# Patient Record
Sex: Male | Born: 1950
Health system: Southern US, Community
[De-identification: ages and names within clinical notes are randomized; demographics above are authoritative.]

## PROBLEM LIST (undated history)

## (undated) DIAGNOSIS — N400 Enlarged prostate without lower urinary tract symptoms: Secondary | ICD-10-CM

## (undated) DIAGNOSIS — H269 Unspecified cataract: Secondary | ICD-10-CM

## (undated) DIAGNOSIS — E039 Hypothyroidism, unspecified: Secondary | ICD-10-CM

## (undated) DIAGNOSIS — I10 Essential (primary) hypertension: Secondary | ICD-10-CM

## (undated) DIAGNOSIS — T148XXA Other injury of unspecified body region, initial encounter: Secondary | ICD-10-CM

## (undated) DIAGNOSIS — J449 Chronic obstructive pulmonary disease, unspecified: Secondary | ICD-10-CM

## (undated) DIAGNOSIS — R51 Headache: Secondary | ICD-10-CM

## (undated) DIAGNOSIS — T7840XA Allergy, unspecified, initial encounter: Secondary | ICD-10-CM

## (undated) DIAGNOSIS — G971 Other reaction to spinal and lumbar puncture: Secondary | ICD-10-CM

## (undated) DIAGNOSIS — I2510 Atherosclerotic heart disease of native coronary artery without angina pectoris: Secondary | ICD-10-CM

## (undated) DIAGNOSIS — E119 Type 2 diabetes mellitus without complications: Secondary | ICD-10-CM

## (undated) DIAGNOSIS — M479 Spondylosis, unspecified: Secondary | ICD-10-CM

## (undated) DIAGNOSIS — M519 Unspecified thoracic, thoracolumbar and lumbosacral intervertebral disc disorder: Secondary | ICD-10-CM

## (undated) DIAGNOSIS — E785 Hyperlipidemia, unspecified: Principal | ICD-10-CM

## (undated) DIAGNOSIS — N281 Cyst of kidney, acquired: Secondary | ICD-10-CM

## (undated) DIAGNOSIS — K219 Gastro-esophageal reflux disease without esophagitis: Secondary | ICD-10-CM

## (undated) HISTORY — PX: SINOSCOPY: SHX187

## (undated) HISTORY — DX: Type 2 diabetes mellitus without complications: E11.9

## (undated) HISTORY — PX: FOOT FRACTURE SURGERY: SHX645

## (undated) HISTORY — DX: Spondylosis, unspecified: M47.9

## (undated) HISTORY — PX: COLONOSCOPY W/ POLYPECTOMY: SHX1380

## (undated) HISTORY — DX: Benign prostatic hyperplasia without lower urinary tract symptoms: N40.0

## (undated) HISTORY — DX: Unspecified thoracic, thoracolumbar and lumbosacral intervertebral disc disorder: M51.9

## (undated) HISTORY — DX: Hyperlipidemia, unspecified: E78.5

## (undated) HISTORY — DX: Chronic obstructive pulmonary disease, unspecified: J44.9

## (undated) HISTORY — DX: Unspecified cataract: H26.9

## (undated) HISTORY — PX: EYE SURGERY: SHX253

## (undated) HISTORY — DX: Essential (primary) hypertension: I10

## (undated) HISTORY — PX: CHOLECYSTECTOMY: SHX55

## (undated) HISTORY — DX: Cyst of kidney, acquired: N28.1

## (undated) HISTORY — DX: Gastro-esophageal reflux disease without esophagitis: K21.9

## (undated) HISTORY — DX: Allergy, unspecified, initial encounter: T78.40XA

## (undated) HISTORY — PX: NECK SURGERY: SHX720

## (undated) HISTORY — PX: UPPER GASTROINTESTINAL ENDOSCOPY: SHX188

## (undated) HISTORY — PX: FRACTURE SURGERY: SHX138

## (undated) HISTORY — PX: BACK SURGERY: SHX140

## (undated) HISTORY — DX: Atherosclerotic heart disease of native coronary artery without angina pectoris: I25.10

## (undated) HISTORY — PX: COLONOSCOPY: SHX174

---

## 2000-09-04 ENCOUNTER — Encounter: Payer: Self-pay | Admitting: Gastroenterology

## 2004-01-15 ENCOUNTER — Encounter: Admission: RE | Admit: 2004-01-15 | Discharge: 2004-01-15 | Payer: Self-pay | Admitting: Endocrinology

## 2004-04-12 ENCOUNTER — Encounter: Payer: Self-pay | Admitting: Gastroenterology

## 2004-04-12 LAB — HM COLONOSCOPY

## 2004-05-09 ENCOUNTER — Ambulatory Visit: Payer: Self-pay | Admitting: Endocrinology

## 2004-06-26 HISTORY — PX: SPINE SURGERY: SHX786

## 2004-07-28 ENCOUNTER — Ambulatory Visit: Payer: Self-pay | Admitting: Internal Medicine

## 2004-08-24 ENCOUNTER — Ambulatory Visit (HOSPITAL_COMMUNITY): Admission: RE | Admit: 2004-08-24 | Discharge: 2004-08-25 | Payer: Self-pay | Admitting: Specialist

## 2004-11-11 ENCOUNTER — Encounter: Admission: RE | Admit: 2004-11-11 | Discharge: 2004-11-11 | Payer: Self-pay | Admitting: Specialist

## 2004-11-23 ENCOUNTER — Ambulatory Visit: Payer: Self-pay | Admitting: Gastroenterology

## 2005-01-17 ENCOUNTER — Ambulatory Visit: Payer: Self-pay | Admitting: Endocrinology

## 2005-01-18 ENCOUNTER — Ambulatory Visit: Payer: Self-pay | Admitting: Endocrinology

## 2005-02-08 ENCOUNTER — Ambulatory Visit: Payer: Self-pay | Admitting: Cardiology

## 2005-06-13 ENCOUNTER — Inpatient Hospital Stay (HOSPITAL_COMMUNITY): Admission: RE | Admit: 2005-06-13 | Discharge: 2005-06-14 | Payer: Self-pay | Admitting: Specialist

## 2005-06-26 HISTORY — PX: CARDIAC CATHETERIZATION: SHX172

## 2005-11-23 ENCOUNTER — Ambulatory Visit: Payer: Self-pay | Admitting: Cardiology

## 2005-11-23 ENCOUNTER — Inpatient Hospital Stay (HOSPITAL_COMMUNITY): Admission: EM | Admit: 2005-11-23 | Discharge: 2005-11-25 | Payer: Self-pay | Admitting: Emergency Medicine

## 2005-12-11 ENCOUNTER — Ambulatory Visit (HOSPITAL_COMMUNITY): Admission: RE | Admit: 2005-12-11 | Discharge: 2005-12-11 | Payer: Self-pay | Admitting: *Deleted

## 2005-12-11 ENCOUNTER — Ambulatory Visit: Payer: Self-pay | Admitting: Cardiology

## 2006-01-29 ENCOUNTER — Ambulatory Visit: Payer: Self-pay | Admitting: *Deleted

## 2006-03-05 ENCOUNTER — Ambulatory Visit: Payer: Self-pay | Admitting: *Deleted

## 2006-04-13 ENCOUNTER — Ambulatory Visit: Payer: Self-pay | Admitting: Endocrinology

## 2006-11-26 ENCOUNTER — Ambulatory Visit: Payer: Self-pay | Admitting: Gastroenterology

## 2006-12-05 ENCOUNTER — Ambulatory Visit: Payer: Self-pay | Admitting: *Deleted

## 2006-12-10 ENCOUNTER — Ambulatory Visit: Payer: Self-pay | Admitting: Cardiovascular Disease

## 2006-12-10 LAB — CONVERTED CEMR LAB
ALT: 22 units/L (ref 0–40)
AST: 16 units/L (ref 0–37)
Albumin: 3.8 g/dL (ref 3.5–5.2)
Alkaline Phosphatase: 51 units/L (ref 39–117)
Bilirubin, Direct: 0.1 mg/dL (ref 0.0–0.3)
Cholesterol: 178 mg/dL (ref 0–200)
HDL: 40.5 mg/dL (ref >39.0)
LDL Cholesterol: 119 mg/dL — ABNORMAL HIGH (ref 0–99)
Total Bilirubin: 1.3 mg/dL — ABNORMAL HIGH (ref 0.3–1.2)
Total CHOL/HDL Ratio: 4.4
Total Protein: 6.4 g/dL (ref 6.0–8.3)
Triglycerides: 92 mg/dL (ref 0–149)
VLDL: 18 mg/dL (ref 0–40)

## 2007-04-09 ENCOUNTER — Encounter: Payer: Self-pay | Admitting: *Deleted

## 2007-04-09 DIAGNOSIS — K219 Gastro-esophageal reflux disease without esophagitis: Secondary | ICD-10-CM | POA: Insufficient documentation

## 2007-04-09 DIAGNOSIS — E1169 Type 2 diabetes mellitus with other specified complication: Secondary | ICD-10-CM | POA: Insufficient documentation

## 2007-04-09 DIAGNOSIS — E785 Hyperlipidemia, unspecified: Secondary | ICD-10-CM

## 2007-04-09 DIAGNOSIS — M479 Spondylosis, unspecified: Secondary | ICD-10-CM | POA: Insufficient documentation

## 2007-04-09 HISTORY — DX: Hyperlipidemia, unspecified: E78.5

## 2007-04-09 HISTORY — DX: Spondylosis, unspecified: M47.9

## 2007-04-09 HISTORY — DX: Gastro-esophageal reflux disease without esophagitis: K21.9

## 2007-05-31 ENCOUNTER — Ambulatory Visit: Payer: Self-pay | Admitting: Cardiovascular Disease

## 2007-05-31 LAB — CONVERTED CEMR LAB
ALT: 33 units/L (ref 0–53)
AST: 23 units/L (ref 0–37)
Albumin: 3.7 g/dL (ref 3.5–5.2)
Alkaline Phosphatase: 60 units/L (ref 39–117)
BUN: 19 mg/dL (ref 6–23)
Bilirubin, Direct: 0.2 mg/dL (ref 0.0–0.3)
CO2: 24 meq/L (ref 19–32)
Calcium: 8.7 mg/dL (ref 8.4–10.5)
Chloride: 107 meq/L (ref 96–112)
Creatinine, Ser: 0.9 mg/dL (ref 0.4–1.5)
GFR calc Af Amer: 113 mL/min
GFR calc non Af Amer: 93 mL/min
Glucose, Bld: 163 mg/dL — ABNORMAL HIGH (ref 70–99)
Potassium: 3.3 meq/L — ABNORMAL LOW (ref 3.5–5.1)
Sodium: 141 meq/L (ref 135–145)
Total Bilirubin: 1.1 mg/dL (ref 0.3–1.2)
Total Protein: 6.2 g/dL (ref 6.0–8.3)

## 2007-06-07 ENCOUNTER — Ambulatory Visit: Payer: Self-pay | Admitting: Cardiovascular Disease

## 2007-06-07 LAB — CONVERTED CEMR LAB
BUN: 15 mg/dL (ref 6–23)
CO2: 27 meq/L (ref 19–32)
Calcium: 8.7 mg/dL (ref 8.4–10.5)
Chloride: 107 meq/L (ref 96–112)
Creatinine, Ser: 0.9 mg/dL (ref 0.4–1.5)
GFR calc Af Amer: 113 mL/min
GFR calc non Af Amer: 93 mL/min
Glucose, Bld: 105 mg/dL — ABNORMAL HIGH (ref 70–99)
Potassium: 3.7 meq/L (ref 3.5–5.1)
Sodium: 142 meq/L (ref 135–145)

## 2007-11-14 ENCOUNTER — Telehealth (INDEPENDENT_AMBULATORY_CARE_PROVIDER_SITE_OTHER): Payer: Self-pay | Admitting: Gastroenterology

## 2007-11-15 ENCOUNTER — Ambulatory Visit: Payer: Self-pay | Admitting: Gastroenterology

## 2008-01-10 ENCOUNTER — Ambulatory Visit: Payer: Self-pay | Admitting: Cardiovascular Disease

## 2008-01-31 ENCOUNTER — Ambulatory Visit: Payer: Self-pay

## 2008-01-31 ENCOUNTER — Ambulatory Visit: Payer: Self-pay | Admitting: Cardiovascular Disease

## 2008-01-31 LAB — CONVERTED CEMR LAB
ALT: 28 units/L (ref 0–53)
AST: 22 units/L (ref 0–37)
Albumin: 3.9 g/dL (ref 3.5–5.2)
Alkaline Phosphatase: 63 units/L (ref 39–117)
Bilirubin, Direct: 0.1 mg/dL (ref 0.0–0.3)
Cholesterol: 152 mg/dL (ref 0–200)
HDL: 35.4 mg/dL — ABNORMAL LOW (ref >39.0)
LDL Cholesterol: 104 mg/dL — ABNORMAL HIGH (ref 0–99)
Total Bilirubin: 1.2 mg/dL (ref 0.3–1.2)
Total CHOL/HDL Ratio: 4.3
Total Protein: 6.5 g/dL (ref 6.0–8.3)
Triglycerides: 64 mg/dL (ref 0–149)
VLDL: 13 mg/dL (ref 0–40)

## 2008-07-22 ENCOUNTER — Ambulatory Visit: Payer: Self-pay | Admitting: Gastroenterology

## 2008-08-18 ENCOUNTER — Encounter: Payer: Self-pay | Admitting: Gastroenterology

## 2008-08-18 ENCOUNTER — Ambulatory Visit: Payer: Self-pay | Admitting: Gastroenterology

## 2008-08-21 ENCOUNTER — Encounter: Payer: Self-pay | Admitting: Gastroenterology

## 2009-06-29 ENCOUNTER — Telehealth: Payer: Self-pay | Admitting: Gastroenterology

## 2010-03-14 ENCOUNTER — Encounter: Payer: Self-pay | Admitting: Endocrinology

## 2010-03-23 ENCOUNTER — Encounter: Payer: Self-pay | Admitting: Endocrinology

## 2010-03-28 ENCOUNTER — Encounter: Admission: RE | Admit: 2010-03-28 | Discharge: 2010-03-28 | Payer: Self-pay | Admitting: Specialist

## 2010-04-08 ENCOUNTER — Telehealth: Payer: Self-pay | Admitting: Endocrinology

## 2010-04-19 ENCOUNTER — Ambulatory Visit: Payer: Self-pay | Admitting: Cardiovascular Disease

## 2010-04-19 DIAGNOSIS — I2510 Atherosclerotic heart disease of native coronary artery without angina pectoris: Secondary | ICD-10-CM

## 2010-04-19 DIAGNOSIS — I10 Essential (primary) hypertension: Secondary | ICD-10-CM

## 2010-04-19 HISTORY — DX: Essential (primary) hypertension: I10

## 2010-04-19 HISTORY — DX: Atherosclerotic heart disease of native coronary artery without angina pectoris: I25.10

## 2010-04-21 ENCOUNTER — Encounter: Payer: Self-pay | Admitting: Cardiovascular Disease

## 2010-04-25 ENCOUNTER — Encounter: Payer: Self-pay | Admitting: Endocrinology

## 2010-04-28 ENCOUNTER — Encounter (INDEPENDENT_AMBULATORY_CARE_PROVIDER_SITE_OTHER): Payer: Self-pay | Admitting: *Deleted

## 2010-05-04 ENCOUNTER — Telehealth (INDEPENDENT_AMBULATORY_CARE_PROVIDER_SITE_OTHER): Payer: Self-pay | Admitting: *Deleted

## 2010-05-17 ENCOUNTER — Ambulatory Visit: Payer: Self-pay | Admitting: Endocrinology

## 2010-05-27 ENCOUNTER — Ambulatory Visit: Payer: Self-pay | Admitting: Endocrinology

## 2010-05-27 DIAGNOSIS — M519 Unspecified thoracic, thoracolumbar and lumbosacral intervertebral disc disorder: Secondary | ICD-10-CM | POA: Insufficient documentation

## 2010-05-27 DIAGNOSIS — N281 Cyst of kidney, acquired: Secondary | ICD-10-CM | POA: Insufficient documentation

## 2010-05-27 HISTORY — DX: Unspecified thoracic, thoracolumbar and lumbosacral intervertebral disc disorder: M51.9

## 2010-05-27 HISTORY — DX: Cyst of kidney, acquired: N28.1

## 2010-05-27 LAB — CONVERTED CEMR LAB
ALT: 41 units/L (ref 0–53)
AST: 25 units/L (ref 0–37)
Albumin: 4.1 g/dL (ref 3.5–5.2)
Alkaline Phosphatase: 76 units/L (ref 39–117)
BUN: 20 mg/dL (ref 6–23)
Basophils Absolute: 0 10*3/uL (ref 0.0–0.1)
Basophils Relative: 0.4 % (ref 0.0–3.0)
Bilirubin Urine: NEGATIVE
Bilirubin, Direct: 0.2 mg/dL (ref 0.0–0.3)
CO2: 27 meq/L (ref 19–32)
Calcium: 9.3 mg/dL (ref 8.4–10.5)
Chloride: 104 meq/L (ref 96–112)
Cholesterol: 141 mg/dL (ref 0–200)
Creatinine, Ser: 1 mg/dL (ref 0.4–1.5)
Eosinophils Absolute: 0.2 10*3/uL (ref 0.0–0.7)
Eosinophils Relative: 2.6 % (ref 0.0–5.0)
GFR calc non Af Amer: 78.79 mL/min (ref >60)
Glucose, Bld: 119 mg/dL — ABNORMAL HIGH (ref 70–99)
HCT: 48.3 % (ref 39.0–52.0)
HDL: 40.8 mg/dL (ref >39.00)
Hemoglobin: 16.9 g/dL (ref 13.0–17.0)
Hgb A1c MFr Bld: 6.8 % — ABNORMAL HIGH (ref 4.6–6.5)
Ketones, ur: NEGATIVE mg/dL
LDL Cholesterol: 76 mg/dL (ref 0–99)
Leukocytes, UA: NEGATIVE
Lymphocytes Relative: 20.6 % (ref 12.0–46.0)
Lymphs Abs: 1.8 10*3/uL (ref 0.7–4.0)
MCHC: 35 g/dL (ref 30.0–36.0)
MCV: 97.5 fL (ref 78.0–100.0)
Monocytes Absolute: 0.6 10*3/uL (ref 0.1–1.0)
Monocytes Relative: 7 % (ref 3.0–12.0)
Neutro Abs: 6.2 10*3/uL (ref 1.4–7.7)
Neutrophils Relative %: 69.4 % (ref 43.0–77.0)
Nitrite: NEGATIVE
PSA: 1.1 ng/mL (ref 0.10–4.00)
Platelets: 170 10*3/uL (ref 150.0–400.0)
Potassium: 3.7 meq/L (ref 3.5–5.1)
RBC: 4.95 M/uL (ref 4.22–5.81)
RDW: 12.7 % (ref 11.5–14.6)
Sodium: 141 meq/L (ref 135–145)
Specific Gravity, Urine: >=1.03 (ref 1.000–1.030)
TSH: 1.26 microintl units/mL (ref 0.35–5.50)
Total Bilirubin: 1.6 mg/dL — ABNORMAL HIGH (ref 0.3–1.2)
Total CHOL/HDL Ratio: 3
Total Protein, Urine: 30 mg/dL
Total Protein: 6.8 g/dL (ref 6.0–8.3)
Triglycerides: 122 mg/dL (ref 0.0–149.0)
Urine Glucose: NEGATIVE mg/dL
Urobilinogen, UA: 0.2 (ref 0.0–1.0)
VLDL: 24.4 mg/dL (ref 0.0–40.0)
WBC: 8.9 10*3/uL (ref 4.5–10.5)
pH: 5.5 (ref 5.0–8.0)

## 2010-07-17 ENCOUNTER — Encounter: Payer: Self-pay | Admitting: Endocrinology

## 2010-07-27 NOTE — Assessment & Plan Note (Signed)
Summary: f1y  Medications Added HYZAAR 100-12.5 MG TABS (LOSARTAN POTASSIUM-HCTZ) take one tablet by mouth daily      Allergies Added: NKDA  Visit Type:  1 year follow up Primary Aijalon Demuro:  Romero Belling, MD  CC:   No complaints.  History of Present Illness: 60 year-old male with history of moderate nonobstructive CAD presents for followup evaluation today. He has not been seen in over 2 years. He would like to switch antihypertensives because of cost...amlodipine/benazepril is now $35/month.  He denies chest pain with exertion, but does complain of burning in his chest at times. Complains of exertional dyspnea with climbing stairs. He does a lot of walking. No other complaints today.  He has not been taking his statin drug regularly.  Current Medications (verified): 1)  Amlodipine Besy-Benazepril Hcl 5-20 Mg  Caps (Amlodipine Besy-Benazepril Hcl) .... Once Daily 2)  Toprol Xl 25 Mg  Tb24 (Metoprolol Succinate) .... Once Daily 3)  Pantoprazole Sodium 40 Mg  Tbec (Pantoprazole Sodium) .... Once Daily 4)  Simvastatin 20 Mg Tabs (Simvastatin) .... At Bedtime  Allergies (verified): No Known Drug Allergies  Past History:  Past medical history reviewed for relevance to current acute and chronic problems.  Past Medical History: GERD Hyperlipidemia CAD, nonobstructive by cath 2007  Social History: Positive tobacco. Works in Set designer.  Review of Systems       Negative except as per HPI.  Vital Signs:  Patient profile:   60 year old male Height:      71 inches Weight:      180.50 pounds BMI:     25.27 Pulse rate:   73 / minute Pulse rhythm:   regular Resp:     18 per minute BP sitting:   120 / 90  (left arm) Cuff size:   large  Vitals Entered By: Vikki Ports (April 19, 2010 11:32 AM)  Physical Exam  General:  Pt is alert and oriented, pleasant, in no acute distress. HEENT: normal Neck: normal carotid upstrokes without bruits, JVP normal Lungs: CTA CV:  RRR without murmur or gallop Abd: soft, NT, positive BS, no bruit, no organomegaly Ext: no clubbing, cyanosis, or edema. peripheral pulses 2+ and equal Skin: warm and dry without rash    EKG  Procedure date:  04/19/2010  Findings:      Sinus rhythm with PAC's otherwise within normal limits. HR 73 bpm.  Impression & Recommendations:  Problem # 1:  CAD, NATIVE VESSEL (ICD-414.01)  Pt appears stable with normal EKG, but he is having chest pains. Recommend check an exercise Myoview in this patient with known 50% LAD stenosis. Continue ASA and risk reduction measures as below.   His updated medication list for this problem includes:    Toprol Xl 25 Mg Tb24 (Metoprolol succinate) ..... Once daily  Orders: EKG w/ Interpretation (93000) Nuclear Stress Test (Nuc Stress Test)  Problem # 2:  HYPERLIPIDEMIA (ICD-272.4) Resume daily use of a statin and followup lipids and LFT's.  His updated medication list for this problem includes:    Simvastatin 20 Mg Tabs (Simvastatin) .[name]... At bedtime  CHOL: 152 (01/31/2008)   LDL: 104 (01/31/2008)   HDL: 35.4 (01/31/2008)   TG: 64 (01/31/2008)  Problem # 3:  HYPERTENSION, BENIGN (ICD-401.1)  Change to Hyzaar for cost reduction. Metabolic panel in 2 weeks.  His updated medication list for this problem includes:    Hyzaar 100-12.5 Mg Tabs (Losartan potassium-hctz) .[name]... Take one tablet by mouth daily    Toprol Xl 25 Mg  Tb24 (Metoprolol succinate) ..... Once daily  Orders: EKG w/ Interpretation (93000) Nuclear Stress Test (Nuc Stress Test)  Patient Instructions: 1)  Your physician recommends that you return for lab work in: 2 WEEKS (BMP, LIVER)--414.01, 401.9, 272.0 2)  Your physician has recommended you make the following change in your medication: STOP Amlodipine/Benazepril, START Hyzaar 100/12.5mg  once a day 3)  Your physician wants you to follow-up in:  1  YEAR.  You will receive a reminder letter in the mail two months in advance. If you  don't receive a letter, please call our office to schedule the follow-up appointment. 4)  Your physician has requested that you have an exercise stress myoview.  For further information please visit [email or web address]  Please follow instruction sheet, as given. 5)  TAKE YOUR SIMVASTATIN EVERYDAY!!! Prescriptions: HYZAAR 100-12.5 MG TABS (LOSARTAN POTASSIUM-HCTZ) take one tablet by mouth daily  #30 x 11   Entered by:   Julieta Gutting, RN, BSN   Authorized by:   Norva Karvonen, MD   Signed by:   Julieta Gutting, RN, BSN on 04/19/2010   Method used:   Electronically to        [facility]. [phone]* (retail)       [phone] [address].       [place], [place]  [address]       Ph: [phone]       Fax: [phone]   RxID:   [id number]

## 2010-07-27 NOTE — Assessment & Plan Note (Signed)
Summary: PER PT PHYSICAL  -LAST OV--04/13/06  ---STC   Vital Signs:  Patient profile:   60 year old male Height:      71 inches (180.34 cm) Weight:      191.38 pounds (86.99 kg) BMI:     26.79 O2 Sat:      95 % on Room air Temp:     97.7 degrees F (36.50 degrees C) oral Pulse rate:   80 / minute BP sitting:   140 / 88  (left arm) Cuff size:   large  Vitals Entered By: Brenton Grills CMA Duncan Dull) (May 27, 2010 9:40 AM)  O2 Flow:  Room air CC: Physical/aj Is Patient Diabetic? No   Primary Jeff Lopez:  Romero Belling, MD  CC:  Physical/aj.  History of Present Illness: here for regular wellness examination.  He does not drink alcohol.      Current Medications (verified): 1)  Hyzaar 100-12.5 Mg Tabs (Losartan Potassium-Hctz) .... Take One Tablet By Mouth Daily 2)  Toprol Xl 25 Mg  Tb24 (Metoprolol Succinate) .... Once Daily 3)  Pantoprazole Sodium 40 Mg  Tbec (Pantoprazole Sodium) .... Once Daily 4)  Simvastatin 20 Mg Tabs (Simvastatin) .... At Bedtime  Allergies (verified): No Known Drug Allergies  Family History: Reviewed history from 04/15/2010 and no changes required.  His father is alive at age 22 with no history of heart  disease.  His mother died at age 89 of a massive heart attack.  He has one brother who is alive with a history of hypertension, but no heart disease. mother had breast and uterine cancer  Social History: Reviewed history from 04/19/2010 and no changes required. Positive tobacco. Works in Set designer.  Review of Systems  The patient denies fever, weight loss, weight gain, vision loss, decreased hearing, chest pain, syncope, dyspnea on exertion, prolonged cough, abdominal pain, melena, hematochezia, severe indigestion/heartburn, hematuria, and depression.         he has chronic headache.  denies decreased urinary stream  Physical Exam  General:  normal appearance.   Head:  head: no deformity eyes: no periorbital swelling, no  proptosis external nose and ears are normal mouth: no lesion seen Neck:  Supple without thyroid enlargement or tenderness.   Lungs:  Clear to auscultation bilaterally. Normal respiratory effort.  Heart:  Regular rate and rhythm without murmurs or gallops noted. Normal S1,S2.   Rectal:  normal external and internal exam.  heme neg  Prostate:  Normal size prostate without masses or tenderness.  Msk:  muscle bulk and strength are grossly normal.  no obvious joint swelling.  gait is normal and steady  Pulses:  dorsalis pedis intact bilat.  no carotid bruit  Extremities:  no deformity.  no ulcer on the feet.  feet are of normal color and temp.  no edema  Neurologic:  cn 2-12 grossly intact.   readily moves all 4's.    Skin:  healed basal cell resect sites at the nose, right chest. Cervical Nodes:  No significant adenopathy.  Psych:  Alert and cooperative; normal mood and affect; normal attention span and concentration.   Additional Exam:  SEPARATE EVALUATION FOLLOWS--EACH PROBLEM HERE IS NEW, NOT RESPONDING TO TREATMENT, OR POSES SIGNIFICANT RISK TO [name] HEALTH: HISTORY OF THE PRESENT ILLNESS: pt was noted on mri to have renal cyst.   pt states 6 mos few of moderate pain at the posterior left thigh and leg.  no assoc numbness.  he had mri of the l-spine, at ortho  PAST MEDICAL HISTORY reviewed and up to date today REVIEW OF SYSTEMS: no hematuria PHYSICAL EXAMINATION: sensation is intact to touch on the legs and feet abdomen is soft, nontender.  no hepatosplenomegaly.   not distended.  no hernia LAB/XRAY RESULTS: i reviewed outside mri report, showing the renal cyst IMPRESSION: sciatica, new renal cyst PLAN: see instruction sheet   Impression & Recommendations:  Problem # 1:  ROUTINE GENERAL MEDICAL EXAM@HEALTH  CARE FACL (ICD-V70.0) we discussed code status.  pt requests full code, but would not want to be started or maintained on artificial life-support measures if  there was not a reasonable chance of recovery.  Medications Added to Medication List This Visit: 1)  Medrol (pak) 4 Mg Tabs (Methylprednisolone) .... As dir 2)  Tramadol-acetaminophen 37.5-325 Mg Tabs (Tramadol-acetaminophen) .[name].. 1 every 4 hrs as needed for pain  Other Orders: Pneumococcal Vaccine ([address]) Admin 1st Vaccine ([address]) TLB-Lipid Panel (80061-LIPID) TLB-BMP (Basic Metabolic Panel-BMET) (80048-METABOL) TLB-CBC Platelet - w/Differential (85025-CBCD) TLB-Hepatic/Liver Function Pnl (80076-HEPATIC) TLB-TSH (Thyroid Stimulating Hormone) (84443-TSH) TLB-PSA (Prostate Specific Antigen) (84153-PSA) TLB-Udip w/ Micro (81001-URINE) TLB-A1C / Hgb A1C (Glycohemoglobin) (83036-A1C)  Patient Instructions: 1)  you should have an ultrasound of the kidneys in may of 2012. 2)  take a medrol "dosepack." 3)  tramadol-acetaminophen as needed for pain 4)  blood tests are being ordered for you today.  please call [phone] to hear your test results. 5)  please consider these measures for your health:  minimize alcohol.  do not use tobacco products.  have a colonoscopy at least every 10 years from age 65.  keep firearms safely stored.  always use seat belts.  have working smoke alarms in your home.  see an eye doctor and dentist regularly.  never drive under the influence of alcohol or drugs (including prescription drugs).  those with fair skin should take precautions against the sun. 6)  please let me know what your wishes would be, if artificial life support measures should become necessary.  it is critically important to prevent falling down (keep floor areas well-lit, dry, and free of loose objects) 7)  (update: i left message on phone-tree:  you should consider metofrmin) Prescriptions: TRAMADOL-ACETAMINOPHEN 37.5-325 MG TABS (TRAMADOL-ACETAMINOPHEN) 1 every 4 hrs as needed for pain  #50 x 2   Entered and Authorized by:   Minus Breeding MD   Signed by:   Minus Breeding MD on 05/27/2010   Method  used:   Electronically to        [facility]. [phone]* (retail)       [phone] [address]       [place], [place]  [address]       Ph: [phone]       Fax: [phone]   RxID:   [id number] MEDROL (PAK) 4 MG TABS (METHYLPREDNISOLONE) as dir  #1 pack x 0   Entered and Authorized by:   Minus Breeding MD   Signed by:   Minus Breeding MD on 05/27/2010   Method used:   Electronically to        [facility]. [phone]* (retail)       [phone] [address].       [place], [place]  [address]       Ph: [phone]       Fax: [phone]   RxID:   [id number]    Orders Added: 1)  Pneumococcal Vaccine [90732] 2)  Admin 1st Vaccine [90471] 3)  TLB-Lipid Panel [80061-LIPID] 4)  TLB-BMP (Basic Metabolic Panel-BMET) [80048-METABOL] 5)  TLB-CBC Platelet - w/Differential [85025-CBCD] 6)  TLB-Hepatic/Liver Function Pnl [80076-HEPATIC] 7)  TLB-TSH (Thyroid Stimulating Hormone) [84443-TSH] 8)  TLB-PSA (Prostate Specific Antigen) [84153-PSA] 9)  TLB-Udip w/ Micro [81001-URINE] 10)  TLB-A1C / Hgb A1C (Glycohemoglobin) [83036-A1C]   Immunizations Administered:  Pneumonia Vaccine:    Vaccine Type: Pneumovax    Site: left deltoid    Mfr: Merck    Dose: 0.5 ml    Route: IM    Given by: Brenton Grills CMA (AAMA)    Exp. Date: 10/21/2011    Lot #: [id number]    VIS given: 05/31/09 version given May 27, 2010.   Immunizations Administered:  Pneumonia Vaccine:    Vaccine Type: Pneumovax    Site: left deltoid    Mfr: Merck    Dose: 0.5 ml    Route: IM    Given by: Brenton Grills CMA (AAMA)    Exp. Date: 10/21/2011    Lot #: [id number]    VIS given: 05/31/09 version given May 27, 2010.

## 2010-07-27 NOTE — Miscellaneous (Signed)
Summary: Immunization Entry   Immunization History:  Influenza Immunization History:    Influenza:  historical (04/23/2010)   Lot #: [id number] Exp. Date: 12/24/2010 Injection Site: Left Deltoid Immunization given at [facility] [place]  Brenton Grills CMA Duncan Dull)  April 25, 2010 11:48 AM

## 2010-07-27 NOTE — Letter (Signed)
Summary: [facility]   Imported By: Sherian Rein 04/11/2010 11:01:33  _____________________________________________________________________  External Attachment:    Type:   Image     Comment:   External Document

## 2010-07-27 NOTE — Progress Notes (Signed)
Summary: OV due  Phone Note Outgoing Call Call back at [facility] Phone [phone]   Call placed by: Brenton Grills MA,  April 08, 2010 4:42 PM Call placed to: Patient Summary of Call: Per MD, pt hasn't been seen since 2007. Left message to callback office to schedule office visit Initial call taken by: Brenton Grills MA,  April 08, 2010 4:43 PM  Follow-up for Phone Call        left message for pt to callback office Follow-up by: Brenton Grills MA,  April 15, 2010 11:33 AM  Additional Follow-up for Phone Call Additional follow up Details #1::        left message for pt to callback office  left message for pt to callback office Brenton Grills CMA Duncan Dull)  April 25, 2010 4:18 PM  Additional Follow-up by: Brenton Grills MA,  April 20, 2010 3:14 PM    Additional Follow-up for Phone Call Additional follow up Details #2::    unable to reach patient. Will mail letter to pt to schedule an appointment Follow-up by: Brenton Grills CMA Duncan Dull),  April 28, 2010 11:37 AM

## 2010-07-27 NOTE — Letter (Signed)
Summary: Generic Letter  [facility]  [address] [place], [place] [address]   Phone: [phone]  Fax: [phone]    04/28/2010  RHYAN RADLER [address] [place], [place]  [address]  Dear Mr. Egge,   According to our records, you haven't been seen by Dr. Everardo All since 2007. Dr. Everardo All would like you to schedule an office visit. Please contact our office at [phone] to schedule an appointment at your convienence.        Sincerely,   Brenton Grills CMA (AAMA)

## 2010-07-27 NOTE — Progress Notes (Signed)
Summary: Nuclear Pre-Procedure  Phone Note Outgoing Call   Call placed by: Milana Na, EMT-P,  May 04, 2010 10:22 AM Summary of Call: Left message with information on Myoview Information Sheet (see scanned document for details).      Nuclear Med Background Indications for Stress Test: Evaluation for Ischemia   History: Heart Catheterization, Myocardial Perfusion Study  History Comments: 2007 Heart Cath N/O CAD LAD 01/31/08 MPS NL LVF EF55%  Symptoms: Chest Pain, DOE    Nuclear Pre-Procedure Cardiac Risk Factors: Family History - CAD, Hypertension, Lipids, Smoker Height (in): 71  Nuclear Med Study Referring MD:  M.Cooper     Appended Document: Nuclear Pre-Procedure Pt left massage that he is having back problems and is going for an injection today. Stated WCB to resched when better.

## 2010-07-27 NOTE — Progress Notes (Signed)
Summary: rx refills   Phone Note Call from Patient Call back at [phone]   Caller: Patient Call For: Christella Hartigan Reason for Call: Refill Medication, Talk to Nurse Summary of Call: Patient would like refills for his rx Pantoprazole sent to [facility] [phone] Initial call taken by: Tawni Levy,  June 29, 2009 2:00 PM  Follow-up for Phone Call        refill was sent April 2010 with 11 refills, left message on machine to call back regarding the amount of pills taken and why he has ran out early. Chales Abrahams CMA Duncan Dull)  June 29, 2009 2:25 PM    Prescriptions: PANTOPRAZOLE SODIUM 40 MG  TBEC (PANTOPRAZOLE SODIUM) once daily  #30 Tablet x 10   Entered by:   Chales Abrahams CMA (AAMA)   Authorized by:   Rachael Fee MD   Signed by:   Chales Abrahams CMA (AAMA) on 06/29/2009   Method used:   Electronically to        [facility]. [phone]* (retail)       [phone] [address]       [place], [place]  [address]       Ph: [phone]       Fax: [phone]   RxID:   [id number]   Appended Document: rx refills med sent to pharmacy and to call about the rx and frequency needed.    Appended Document: rx refills left message on machine to call back regarding symptoms and if he is taking his med more than once daily.

## 2010-08-15 ENCOUNTER — Ambulatory Visit (INDEPENDENT_AMBULATORY_CARE_PROVIDER_SITE_OTHER): Payer: 59 | Admitting: Endocrinology

## 2010-08-15 ENCOUNTER — Encounter: Payer: Self-pay | Admitting: Endocrinology

## 2010-08-15 DIAGNOSIS — J069 Acute upper respiratory infection, unspecified: Secondary | ICD-10-CM

## 2010-08-23 ENCOUNTER — Ambulatory Visit (INDEPENDENT_AMBULATORY_CARE_PROVIDER_SITE_OTHER)
Admission: RE | Admit: 2010-08-23 | Discharge: 2010-08-23 | Disposition: A | Discharge status: Home / Self Care | Payer: 59 | Source: Ambulatory Visit | Attending: Endocrinology | Admitting: Endocrinology

## 2010-08-23 ENCOUNTER — Other Ambulatory Visit: Payer: Self-pay | Admitting: Endocrinology

## 2010-08-23 ENCOUNTER — Ambulatory Visit (INDEPENDENT_AMBULATORY_CARE_PROVIDER_SITE_OTHER): Payer: 59 | Admitting: Endocrinology

## 2010-08-23 ENCOUNTER — Encounter: Payer: Self-pay | Admitting: Endocrinology

## 2010-08-23 DIAGNOSIS — R05 Cough: Secondary | ICD-10-CM

## 2010-08-23 DIAGNOSIS — R059 Cough, unspecified: Secondary | ICD-10-CM | POA: Insufficient documentation

## 2010-08-23 NOTE — Assessment & Plan Note (Signed)
Summary: SORE THROAT-STUFFY NOSE-RUNNY EYES-COUGH--STC   Vital Signs:  Patient profile:   60 year old male Height:      71 inches (180.34 cm) Weight:      195.50 pounds (88.86 kg) BMI:     27.37 O2 Sat:      94 % on Room air Temp:     97.9 degrees F (36.61 degrees C) oral Pulse rate:   69 / minute Pulse rhythm:   regular BP sitting:   132 / 98  (left arm) Cuff size:   large  Vitals Entered By: Brenton Grills CMA Duncan Dull) (August 15, 2010 9:56 AM)  O2 Flow:  Room air CC: Cold sxs (sore throat, runny nose, chest congestion, cough, ear pain x 3 days/aj Is Patient Diabetic? No Comments Pt states OTC meds not helping (Benadryl, Mucinex cough syrup)   Primary Provider:  Romero Belling, MD  CC:  Cold sxs (sore throat, runny nose, chest congestion, cough, and ear pain x 3 days/aj.  History of Present Illness: see above.  he was seen at urgent care 2 days ago.  no help with benadryl, prednisone, and flonase.    Current Medications (verified): 1)  Hyzaar 100-12.5 Mg Tabs (Losartan Potassium-Hctz) .... Take One Tablet By Mouth Daily 2)  Toprol Xl 25 Mg  Tb24 (Metoprolol Succinate) .... Once Daily 3)  Pantoprazole Sodium 40 Mg  Tbec (Pantoprazole Sodium) .... Once Daily 4)  Simvastatin 20 Mg Tabs (Simvastatin) .... At Bedtime 5)  Medrol (Pak) 4 Mg Tabs (Methylprednisolone) .... As Dir 6)  Tramadol-Acetaminophen 37.5-325 Mg Tabs (Tramadol-Acetaminophen) .[name].. 1 Every 4 Hrs As Needed For Pain 7)  Prednisone 10 Mg Tabs (Prednisone) .... Take 7 Tablets On Day 1 Then 6, 5, 4, 3, 2, 1 On Consecutive Days 8)  Flonase 50 Mcg/act Susp (Fluticasone Propionate) .... 2 Puffs Into Each Nostril Once Daily  Allergies (verified): No Known Drug Allergies  Past History:  Past Medical History: Last updated: 04/19/2010 GERD Hyperlipidemia CAD, nonobstructive by cath 2007  Review of Systems       he has wheezing, but not sob  Physical Exam  General:  normal appearance.   Head:  head: no  deformity eyes: no periorbital swelling, no proptosis external nose and ears are normal mouth: no lesion seen Ears:  both tm's are red (right > left) Neck:  supple Lungs:  cta, except few exp wheezes   Impression & Recommendations:  Problem # 1:  URI (ICD-465.9) Assessment Unchanged with wheezing  Medications Added to Medication List This Visit: 1)  Prednisone 10 Mg Tabs (Prednisone) .... Take 7 tablets on day 1 then 6, 5, 4, 3, 2, 1 on consecutive days 2)  Flonase 50 Mcg/act Susp (Fluticasone propionate) .... 2 puffs into each nostril once daily 3)  Azithromycin 500 Mg Tabs (Azithromycin) .[name].. 1 tab once daily 4)  Promethazine-codeine 6.25-10 Mg/47ml Syrp (Promethazine-codeine) .[name].. 1 teaspoon every 4 hrs as needed for cough  Other Orders: Est. Patient Level III ([address])  Patient Instructions: 1)  take azithromycin 500 mg once daily. 2)  here is a sample of "advair-100."  take 1 puff two times a day.  rinse mouth after using. 3)  here is a prescription for cough syrup also. Prescriptions: PROMETHAZINE-CODEINE 6.25-10 MG/5ML SYRP (PROMETHAZINE-CODEINE) 1 teaspoon every 4 hrs as needed for cough  #8 oz x 1   Entered and Authorized by:   Minus Breeding MD   Signed by:   Minus Breeding MD on 08/15/2010   Method  used:   Print then Give to Patient   RxID:   [id number] AZITHROMYCIN 500 MG TABS (AZITHROMYCIN) 1 tab once daily  #6 x 0   Entered and Authorized by:   Minus Breeding MD   Signed by:   Minus Breeding MD on 08/15/2010   Method used:   Electronically to        [facility]. [phone]* (retail)       [phone] [address]       [place], [place]  [address]       Ph: [phone]       Fax: [phone]   RxID:   [id number]    Orders Added: 1)  Est. Patient Level III [884[address]]

## 2010-08-23 NOTE — Letter (Signed)
Summary: Out of Work  [facility]  [address] [place], [place] [address]   Phone: [phone]  Fax: [phone]    August 15, 2010   Employee:  GAR GLANCE    To Whom It May Concern:   For Medical reasons, please excuse the above named employee from work for the following dates:  Start:   08/15/10  End:   08/17/10         Sincerely,    Minus Breeding MD

## 2010-09-01 NOTE — Assessment & Plan Note (Signed)
Summary: CONGESTION/ COLD /NWS   Vital Signs:  Patient profile:   60 year old male Height:      71 inches Weight:      192 pounds BMI:     26.88 O2 Sat:      96 % on Room air Temp:     98.6 degrees F oral Pulse rate:   79 / minute BP sitting:   152 / 90  (left arm) Cuff size:   regular  Vitals Entered By: Margaret Pyle, CMA (August 23, 2010 8:36 AM)  O2 Flow:  Room air CC: Congestion, night sweats and nasal drainage/DBD   Primary Provider:  Romero Belling, MD  CC:  Congestion and night sweats and nasal drainage/DBD.  History of Present Illness: since he was seen here last week, he says he is no better.  he has night sweats, prod cough, and slight wheezing.  advair and cough syrup help.    Allergies: No Known Drug Allergies  Past History:  Past Medical History: Last updated: 04/19/2010 GERD Hyperlipidemia CAD, nonobstructive by cath 2007  Review of Systems  The patient denies fever and dyspnea on exertion.    Physical Exam  General:  normal appearance.   Head:  head: no deformity eyes: no periorbital swelling, no proptosis external nose and ears are normal mouth: no lesion seen Ears:  left tm is red.  right is normal Lungs:  Clear to auscultation bilaterally. Normal respiratory effort.    Impression & Recommendations:  Problem # 1:  COUGH (ICD-786.2) due to persistent uri  Medications Added to Medication List This Visit: 1)  Levofloxacin 500 Mg Tabs (Levofloxacin) .[name].. 1 tab once daily  Other Orders: T-2 View CXR (71020TC) Est. Patient Level III ([address])  Patient Instructions: 1)  here is another sample of "advair-100" 2)  continue cough syrup as needed. 3)  check chest x-ray today. 4)  then please call [phone] to hear your test results. 5)  take levofloxacin 500 mg two times a day Prescriptions: LEVOFLOXACIN 500 MG TABS (LEVOFLOXACIN) 1 tab once daily  #10 x 0   Entered and Authorized by:   Minus Breeding MD   Signed by:   Minus Breeding MD on 08/23/2010   Method used:   Electronically to        [facility]. [phone]* (retail)       [phone] [address]       [place], [place]  [address]       Ph: [phone]       Fax: [phone]   RxID:   [id number]    Orders Added: 1)  T-2 View CXR [71020TC] 2)  Est. Patient Level III [43[address]]  Appended Document: CONGESTION/ COLD /NWS levaquin is only once daily.  i changed pt's instruction sheet

## 2010-09-01 NOTE — Letter (Signed)
Summary: Out of Work  [facility]  [address] [place], [place] [address]   Phone: [phone]  Fax: [phone]    August 23, 2010   Employee:  JEWELZ KOBUS    To Whom It May Concern:   For Medical reasons, please excuse the above named employee from work for the following dates:  Start:   08/22/10  End:   08/26/10         Sincerely,    Minus Breeding MD

## 2010-09-27 ENCOUNTER — Other Ambulatory Visit: Payer: Self-pay | Admitting: Specialist

## 2010-09-27 DIAGNOSIS — N2889 Other specified disorders of kidney and ureter: Secondary | ICD-10-CM

## 2010-10-05 ENCOUNTER — Ambulatory Visit
Admission: RE | Admit: 2010-10-05 | Discharge: 2010-10-05 | Disposition: A | Discharge status: Home / Self Care | Payer: 59 | Source: Ambulatory Visit | Attending: Specialist | Admitting: Specialist

## 2010-10-05 DIAGNOSIS — N2889 Other specified disorders of kidney and ureter: Secondary | ICD-10-CM

## 2010-10-17 ENCOUNTER — Other Ambulatory Visit: Payer: Self-pay | Admitting: Cardiovascular Disease

## 2010-11-03 ENCOUNTER — Ambulatory Visit (INDEPENDENT_AMBULATORY_CARE_PROVIDER_SITE_OTHER): Payer: 59 | Admitting: Endocrinology

## 2010-11-03 ENCOUNTER — Encounter: Payer: Self-pay | Admitting: Endocrinology

## 2010-11-03 DIAGNOSIS — E785 Hyperlipidemia, unspecified: Secondary | ICD-10-CM

## 2010-11-03 DIAGNOSIS — IMO0002 Reserved for concepts with insufficient information to code with codable children: Secondary | ICD-10-CM

## 2010-11-03 DIAGNOSIS — M5410 Radiculopathy, site unspecified: Secondary | ICD-10-CM

## 2010-11-03 MED ORDER — OXYCODONE HCL 5 MG PO TABS: 5.0000 mg | ORAL_TABLET | ORAL | Status: AC | PRN

## 2010-11-03 NOTE — Patient Instructions (Addendum)
Cc dr Otelia Sergeant Here is a medication for pain, until you can get back to see dr Otelia Sergeant.

## 2010-11-03 NOTE — Progress Notes (Signed)
  Subjective:    Patient ID: Jeff Lopez, male    DOB: January 24, 1951, 60 y.o.   MRN: [id number]  HPI 1 year of intermittently severe pain rad from the left lower back to the lat aspect of the left thigh, and then to the left foot.  No assoc tingling.  No help with 3 steroid injections.  Review of Systems Denies bowel or bladder retention.    Objective:   Physical Exam GENERAL: no distress Back: nontender Neuro: sensation is intact to touch on the feet Gait: normal, except slightly favors left leg.       Assessment & Plan:  Radicular sxs, new

## 2010-11-05 DIAGNOSIS — M5410 Radiculopathy, site unspecified: Secondary | ICD-10-CM | POA: Insufficient documentation

## 2010-11-08 NOTE — Assessment & Plan Note (Signed)
[facility]                            CARDIOLOGY OFFICE NOTE   JAYDAN, MEIDINGER                    MRN:          [id number]  DATE:01/10/2008                            DOB:          1950/09/16    Mr. Isaacson is a 60 year old gentleman with nonobstructive coronary  artery disease, nonobstructive renal artery stenosis, hypertension,  hyperlipidemia, and tobacco abuse.  He presents today for followup.  He  complains of frequent chest pains.  He has had problems with reflux, and  he is not sure if this is related to reflux or some other problem.  At  time, his pains are exertional, but most of the time they are unrelated.  They are sharp and left-sided.  He has a mild degree of exertional  dyspnea that he attributes to weight gain.  There is no orthopnea, PND,  or edema.   He continues to have a very difficult time with chronic headaches.  He  takes Excedrin every 2 hours and has difficulty related to this.  He has  been unable to find another remedy and has been to multiple doctors.   MEDICINES:  1. Protonix 40 mg daily.  2. Lotrel 5/20 mg daily.  3. Metoprolol 25 mg daily.  4. Excedrin Migraine, used frequently.   ALLERGIES:  NKDA.   PHYSICAL EXAMINATION:  GENERAL:  The patient is alert and oriented.  He  is in no acute distress.  VITAL SIGNS:  Weight is 188 pounds, blood pressure 118/80, heart rate  72, and respiratory rate 16.  HEENT:  Normal.  NECK:  Normal.  Carotid upstrokes without bruits.  Jugular venous  pressure is normal.  LUNGS:  Clear to auscultation bilaterally.  HEART:  Regular rate and rhythm without murmurs or gallops.  ABDOMEN:  Soft, nontender.  No organomegaly.  EXTREMITIES:  No clubbing, cyanosis, or edema.  Peripheral pulses 2+ and  equal.   EKG is normal sinus rhythm with a heart rate of 72 beats per minute and  is within normal limits.   ASSESSMENT:  1. Nonobstructive coronary artery disease.  Cardiac  catheterization in      June 2007 demonstrated a 50% plaque in the mid-left anterior      descending with scattered luminal irregularities in the left      circumflex.  Mr. Kirsh is experiencing chest pain.  I suspect it      is noncardiac, but it is difficult to know for sure.  In the      setting of known coronary artery disease and 2 years since previous      evaluation, we will evaluate with exercise Myoview stress study.      He will be asked to hold his metoprolol on the morning of the      study.  Further followup pending stress test results.  Continue      current medical therapy for now.  2. Hypertension, blood pressure under good control on current regimen.  3. Hyperlipidemia.  LDL one year ago was 119.  He was prescribed      simvastatin, but he is not taking  it.  Follow up lipids and LFTs to      reassess and we will try again him back on antihyperlipidemic      therapy.   Plan on followup in 1 year or sooner if stress test results are  abnormal.     Veverly Fells. Excell Seltzer, MD  Electronically Signed    MDC/MedQ  DD: 01/10/2008  DT: 01/11/2008  Job #: [id number]   cc:   Gregary Signs A. Everardo All, MD

## 2010-11-08 NOTE — Assessment & Plan Note (Signed)
[facility]                            CARDIOLOGY OFFICE NOTE   MALIIK, Jeff Lopez                    MRN:          [id number]  DATE:12/05/2006                            DOB:          02-04-1951    Jeff Lopez is a very pleasant 60 year old white male with non  obstructive coronary artery disease, 50% LAD, 50% renal artery stenosis  on the left. He has hypertension, hyperlipidemia, tobacco abuse, GERD,  history of cervical spine surgery, and constant headaches. The patient  has had no cardiac symptoms recently. His blood pressure has been well  controlled. He is on;  1. Protonix.  2. Aspirin 81.  3. Lotrel 5/20.  4. Metoprolol 25.   He has not been taking statin recently. He continues to smoke.   Blood pressure 120/74, pulse 71, normal sinus rhythm.  GENERAL APPEARANCE: Normal.  JVP is not elevated. Carotid pulses palpable and equal without bruits.  LUNGS: Clear.  CARDIAC EXAM: Reveals no significant murmur.  ABDOMINAL EXAM: Normal.  EXTREMITIES: Normal.   EKG: Within normal limits.   DIAGNOSES:  As above. Blood pressure is well controlled. I have  suggested a follow up lipid and start him on simvastatin 20. I have also  strongly recommended he try to discontinue his cigarettes. He does not  seem motivated.   He will see [name] for follow up in 6 months.     Cecil Cranker, MD, [facility]  Electronically Signed    EJL/MedQ  DD: 12/05/2006  DT: 12/06/2006  Job #: [id number]

## 2010-11-08 NOTE — Assessment & Plan Note (Signed)
[facility]                         GASTROENTEROLOGY OFFICE NOTE   SURAJ, Jeff Lopez                    MRN:          [id number]  DATE:11/26/2006                            DOB:          02-29-1951    [name] comes in and says I need a refill on his Protonix, says he  does not have any other problems.  Today he asked about his next  colonoscopy, I told him he was not due for 2-1/2 years.  The one we took  out previously was hyperplastic.  He says he does well as long as he  takes his Protonix.  He does have known hiatal hernia with a mild early  stricture, gastritis, his last endoscopy was in 2002.  He was dilated at  that time using a 56 Maloney dilator.   PHYSICAL EXAMINATION:  He weighed 180, blood pressure 106/70, pulse 60  and regular.  NECK, HEART, EXTREMITIES:  Unremarkable.   IMPRESSION:  1. Arteriosclerotic cardiovascular disease.  2. Nonobstructive coronary artery disease.  3. Renal artery stenosis on the left.  4. Hypertension.  5. Dyslipidemia.  6. Tobacco abuse.  7. Cervical spine surgery, status post.  8. Gastroesophageal reflux disease, secondary to enlarged hiatal      hernia.  9. History of small colon polyp.   RECOMMENDATIONS:  To refill his Protonix and remind him that he needs a  colonoscopic examination within the next several years.     Ulyess Mort, MD  Electronically Signed    SML/MedQ  DD: 11/26/2006  DT: 11/27/2006  Job #: [id number]

## 2010-11-08 NOTE — Assessment & Plan Note (Signed)
[facility]                            CARDIOLOGY OFFICE NOTE   Jeff Lopez, Jeff Lopez                    MRN:          [id number]  DATE:05/31/2007                            DOB:          1951-03-01    [name] is a 60 year old gentleman with nonobstructive coronary  artery disease as well as nonobstructive renal artery stenosis.  He has  hypertension, hyperlipidemia, and tobacco abuse.  [name]  problem by far is headaches.  He has had a severe problem with headaches  and has been treated at the headache center.  He is now reliant on  Excedrin.  He is taking at least 10 to 12 Excedrin daily and it is  causing a lot of GI upset.  He has approximately 2 hours of relief with  this and then his symptoms return.   He has had some resting chest pain that he related to esophageal reflux.  He has had no exertional symptoms.  He denies any dyspnea, orthopnea,  PND, or edema.  He has had no palpitations, light-headedness, or  syncope.  He has no other complaints at this time.   CURRENT MEDICATIONS:  1. Protonix 40 mg daily.  2. Lotrel 5/20 mg daily.  3. Metoprolol succinate 25 mg daily.  4. Excedrin at least 10-12 daily.   EXAM:  He is alert and oriented.  In no acute distress.  Weight 185, blood pressure 129/82, heart rate 59, respiratory rate 12.  HENT:  Normal.  NECK:  Normal carotid upstrokes without bruits.  Jugular venous pressure  is normal.  LUNGS:  Clear to auscultation bilaterally.  HEART:  Regular rate and rhythm without murmur, rub, or gallop.  ABDOMEN:  Soft and nontender.  No organomegaly.  EXTREMITIES:  No cyanosis, clubbing, or edema.   EKG is normal sinus rhythm with nonspecific T wave abnormality.   ASSESSMENT:  1. Nonobstructive coronary artery disease.  The patient remains on      metoprolol.  He has stopped taking Statin on his own.  We had a      discussion about resuming this.  See below.  He is having no  angina      at present and will continue current medical therapy.  2. Dyslipidemia.  Lipids from June of this year showed an LDL      cholesterol of 119, an HDL of 41, and a total cholesterol 178.  I      have asked him to resume his simvastatin at a dose of 20 mg daily.      Since he is taking so much Tylenol, I would like to check his liver      function tests before we have him start this back.  His blood work      will be drawn today and we will contact him to let him know whether      he can resume his simvastatin.  3. Headaches.  [name] has a severe problem with his headaches.      He sounds like he is having analgesic headaches now, and is taking  a great deal of Excedrin, which contains both acetaminophen and      aspirin.  We had a long discussion about the importance of reducing      his analgesic intake, and he will try to do this under the      direction of a neurologist at the headache center.  We will check a      complete metabolic panel today to make sure that his renal and      hepatic functions remain normal.  4. Hypertension.  His blood pressure is under good control on his      current therapy.  5. For followup, I would like to see him back in 6 months.     Veverly Fells. Excell Seltzer, MD  Electronically Signed    MDC/MedQ  DD: 05/31/2007  DT: 05/31/2007  Job #: [id number]

## 2010-11-11 NOTE — H&P (Signed)
NAME:  Jeff Lopez, Jeff Lopez             ACCOUNT NO.:  [id number]   MEDICAL RECORD NO.:  [id number]          PATIENT TYPE:  OIB   LOCATION:  NA                           FACILITY:  [facility]   PHYSICIAN:  Kerrin Champagne, M.D.   DATE OF BIRTH:  Apr 22, 1951   DATE OF ADMISSION:  08/24/2004  DATE OF DISCHARGE:                                HISTORY & PHYSICAL   CHIEF COMPLAINT:  Neck and right upper extremity pain.   HISTORY OF PRESENT ILLNESS:  The patient is a 60 year old male who has been  having problems with his neck and right upper extremity which have  unfortunately been getting progressively worse over the last several weeks.  The pain has gotten to the point that he is not able to sleep.  He is  completely miserable, he has tried medications, activity modification,  Sterapred Dosepak as well as a home exercise program and stretching program.  Unfortunately, none of these treatments tried thus far have given him any  relief of his symptoms.  His symptoms are severe in nature.  The risks and  benefits of the procedure were discussed with the patient by Dr. Otelia Sergeant.  He  indicated understanding enough to proceed with the surgery.   ALLERGIES:  No known drug allergies.   CURRENT MEDICATIONS:  1.  Protonix 40 mg daily.  2.  Triamterene/hydrochlorothiazide 37.5/25 mg p.o. daily.  3.  Lovastatin 40 mg two p.o. at bedtime.   PAST MEDICAL HISTORY:  1.  Hypertension.  2.  Hypercholesterolemia.   PAST SURGICAL HISTORY:  None.   SOCIAL HISTORY:  The patient is single.  Smokes 1-1/2 packs of cigarettes  per day.  Denies alcohol use.  He does have some friends that will assist  him through his postoperative period.   FAMILY MEDICAL HISTORY:  Mother deceased at age 52 secondary to MI and  uterine cancer.   REVIEW OF SYSTEMS:  The patient denies fevers, chills, sweats or bleeding  tendencies.  CNS:  Denies blurred vision, double vision, seizures,  headaches, paralysis.  Cardiovascular:  Denies  chest pain, angina,  orthopnea, claudication or palpitations.  Pulmonary:  Denies shortness of  breath, productive cough or hemoptysis.  GI:  Denies nausea or vomiting,  constipation or diarrhea, melena, or bloody stool.  GU:  Denies dysuria,  hematuria or discharge.  Musculoskeletal:  As per HPI.   PHYSICAL EXAMINATION:  VITAL SIGNS:  Pulse is 74 and regular, respirations  16 and unlabored, blood pressure 128/90.  GENERAL:  The patient is a 60 year old male who is alert and oriented in no  acute distress.  Well nourished, well developed, appears stated age,  pleasant and cooperative with exam.  HEENT:  Head: Normocephalic, atraumatic.  Pupils equal, round, and reactive  to light.  Extraocular movements intact.  Nares patent.  Oropharynx is  clear.  NECK:  Soft to palpation.  No lymphadenopathy, thyromegaly or bruits  appreciated.  CHEST:  Clear to auscultation bilaterally.  No rales, rhonchi, stridor,  wheezes or friction rubs.  BREASTS:  Not pertinent, not performed.  HEART:  S1 and S2 regular rate and  rhythm.  No murmurs, gallops, or rubs  noted.  ABDOMEN:  Soft to palpation, nontender, nondistended, no organomegaly noted.  GU:  Not pertinent and not performed.  EXTREMITIES:  As per HPI.  SKIN:  Intact without any lesions or rashes.   DIAGNOSTIC STUDIES:  MRI shows HNP at C5-6.   IMPRESSION:  1.  Spondylosis and herniated nucleus pulposus at C5-6 with right upper      extremity radicular symptoms.  2.  Hypertension.  3.  Hypercholesterolemia.   PLAN:  Admitted to [facility] on August 24, 2004 for an ACDF at C5-6.  This will be by Dr. Otelia Sergeant.      CM/MEDQ  D:  08/24/2004  T:  08/24/2004  Job:  [id number]   cc:   Kerrin Champagne, M.D.  [address]  [place]  [place] [address]  Fax: [phone]

## 2010-11-11 NOTE — Op Note (Signed)
NAME:  Jeff Lopez, Jeff Lopez NO.:  [id number]   MEDICAL RECORD NO.:  [id number]          PATIENT TYPE:  OIB   LOCATION:  2859                         FACILITY:  [facility]   PHYSICIAN:  Kerrin Champagne, M.D.   DATE OF BIRTH:  1950-11-02   DATE OF PROCEDURE:  08/24/2004  DATE OF DISCHARGE:                                 OPERATIVE REPORT   PREOPERATIVE DIAGNOSIS:  A right-sided C5-6 HNP with right C6 nerve root  compression.   POSTOPERATIVE DIAGNOSIS:  A right-sided C5-6 HNP with right C6 nerve root  compression.   PROCEDURE:  Anterior cervical diskectomy and fusion, the C5-6 level with  right iliac crest bone graft harvested through separate incision. Internal  fixation with a 25 mm DePuy locking plate and 14 mm screws. The patient  underwent microscopic excision of the disk with foraminotomy on the right  side of the C6 nerve root.   SURGEON:  Kerrin Champagne, M.D.   ASSISTANT:  Jene Every, M.D.   ANESTHESIA:  GOT, Dr. Diamantina Monks.   ESTIMATED BLOOD LOSS:  25 cc.   DRAINS:  7-French TLS drain, anterior neck.   ESTIMATED BLOOD LOSS:  50 cc or less.   BRIEF CLINICAL HISTORY:  The patient, a 60 year old male who had been  experiencing pain into his neck, radiation of the right arm.  He has  undergone extensive attempts at conservative management including attempts  at physical therapy. He has significant weakness in a C6 distribution biceps  as well as forearm pronation. The patient is miserable, has been on a Medrol  Dosepak, radiation into shoulder and arm.  [name] onset was the  beginning of February.  It has been refractory to conservative management  and because of increased discomfort, pain weakness he is brought to the  operating room to undergo anterior diskectomy and fusion at the C6-7 level.   INTRAOPERATIVE FINDINGS:  Right C5-6 HNP in combination with foraminal  entrapment secondary to uncovertebral and posterior lip osteophytes along  the  right side affecting the right C6 nerve root.   DESCRIPTION OF PROCEDURE:  After adequate general anesthesia, the patient in  beach-chair position, the head on the padded [place] horseshoe. The patient  with a bump under the right buttock.  The neck in five pounds cervical  halter traction and slight extension. Standard prep over the anterior neck  and right iliac crest with DuraPrep solution. Standard preoperative  antibiotics. The patient draped in the usual manner iodine Vi-drape was  used.   Incision over the anterior left neck approximately 3.5 to 4 cm in length in  line with the patient's skin creases through the skin and subcu layers down  to the platysma layer. Bleeders controlled using electrocautery. This  incision made at the expected C6 level as the cricothyroid cartilage was  felt as well as carotid tuberosity. The  platysma layer then incised in line  with the patient's skin incision and the deep fascial layers then spread in  the midline and then blunt dissection used to develop the interval between  the trachea and esophagus medially and carotid  sheath laterally to the  anterior aspect of cervical spine the prevertebral fascia here. This was  cauterized using bipolar electrocautery along the medial border of longus  colli muscle teased across the midline with Barista as well as Investment banker, corporate. Initial spinal needle placed at the expected C5-6 level felt to  probably represent C6-7 however, as it entered easily, intraoperative  lateral radiographs x 2 unable to demonstrate the position with the  alignment of these needles, so the C-arm fluoro was brought into the field.  Under C-arm fluoro second needle placed at the C5-6 level above the previous  needle one level. This found to be the C5-6 level on C-arm fluoro and these  preserved for later documentation purposes.   The lower needle then removed under direct observation with use of handheld  Cloward.   Soft  tissue retraction then carried anteriorly. The prevertebral fascia  incised more superiorly and freed up carefully. Trachea esophagus retracted  medially. A 15 blade scalpel used to incise disk of C5-6 level using this to  excise the anterior portion of the annulus for continued identification  throughout the remainder of the case. Medial border longus colli muscle  carefully free up both over the left and right side out to the levels of the  lateral aspects of the vertebral body. A Boss McCullough retractor then  inserted with foot of the blade beneath the medial border of the longus  colli muscle of both sides exposing the anterior aspect of the disk space of  the C5-6 level. Pituitary rongeurs used to remove further disk material.  14  mm screw post inserted at the C5 and C6 level distracting the disk space.  High-speed bur then used to resect the anterior lip osteophytes using a  diamond tip bur and protecting soft tissue structures appropriately. Bur was  then used to carefully bur the end plates of the inferior aspect C5 superior  aspect C6 back to the posterior aspect of the disk space and posterior lip  osteophytes. All this was done under loupe magnification as well as the use  of a head lamp. Right iliac crest bone graft harvest site was exposed during  the initial portion of the procedure while radiographs had been obtained and  were being developed.  Incision approximately 3.5 cm in length through skin  and subcu layers approximately two to three fingerbreadths posterior to the  anterior superior iliac spine through skin and subcu layers directly down to  the superficial portion of the iliac crest laterally. Cobb elevation and  electrocautery used carefully perform subperiosteal dissection carrying the  abdominal fascia over the superior medial aspect of the crest exposing this. This was also carried laterally and near bloodless  dissection performed.  This packed with sponges.  Returning to the incision after determining the  level after performing exposure and then under the microscope which was  draped and brought into the field sterilely then posterior aspect of the  disk space was debrided removing posterior lip osteophytes, removing the  posterior longitudinal ligament both to the left side and the right side.  Large spur over the right inferior aspect of the C5 vertebral body and over  the superior aspect of C6 vertebral body was carefully drilled using a high-  speed bur, removing lip osteophyte thinning it and then removing by  resecting it proximal to the area of the largest impression on the cord and  the thecal sac. And also dividing it distal to the  largest area of  impression over the posterior superior aspect of C6. This was carried down  into the neural foramen, decompressing the right C6 nerve root.  Disk  material in addition to spur was excised impressing on the right C6 nerve  root.  This was carried out to the point where the C6 nerve root was then  showing a course anteriorly. Some additional spur was probably present  laterally but not felt to impinge further on the nerve roots at this point.  Irrigation was performed.  The height of the intervertebral disk space  measured a #7 spacer by DePuy. This was 8 mm in width.  An 8 mm dual  oscillating saw then used to excise bone graft from the right iliac crest  through the incision provided carefully protected soft tissue structures  medial and lateral. This was divided across the base with a quarter inch  osteotome. The depth of intervertebral disk space measuring 20 mm so a 15 mm  graft depth was chosen by height of 8 mm. This was carefully tapered to  dimension of the intervertebral disk space. Irrigation was performed in the  intervertebral disk space. The graft then placed over the disk space,  impacted into place. Screw posts were then removed.  Screw post holes coated  with bone wax for  hemostasis. The anterior prominence of this disk space,  anterior lip osteophytes were then further debrided with a high-speed bur,  carefully protecting soft tissue structures appropriately. 25 mm screw was  then fixed over the anterior aspect of the disk space with the pin in the  lower C6 level anteriorly.  __________ cervical Halter traction was  released.  14 mm screws were then placed in the C5 vertebral body, right  side, left side at the C6 level, right and left side. The then screwed into  place and the locking nuts then carefully turned 180 degrees in order to  lock the plate to the screws provided. Right iliac crest bone graft harvest  site carefully irrigated, bone wax applied to the bony cancellus bone  surfaces and Gelfoam.  Fascia of the abdomen approximated to the proximal  thigh fashion using interrupted #1 Vicryl sutures, deep subcu layers approximated interrupted 0 and 2-0 Vicryl sutures and skin closed with a  running subcu stitch of 4-0 Vicryl. The neck incision was carefully  hemostased using bipolar electrocautery. A 7-French drain was then placed in  depth incision over the plate on the left side and exiting over the anterior  aspect of neck. Platysma layer approximated with interrupted 3-0 Vicryl  sutures. Intraoperative lateral C-arm fluoro used to ascertain correct  position and alignment of the plate and screws. No evidence of retropulsion  or penetration within the spinal canal. The disk and the graft appeared to  be quite good position and alignment.   Following the approximation platysma layer, deep subcu layers approximated  with interrupted two 3-0  Vicryl sutures and skin closed with a running  subcu stitch of 4-0 Vicryl.  Tincture of Benzoin and Steri-Strips applied.  [name] drain was sewn into place, the 7-French TLS with a 4-0 nylon  stitch. After applying suture and Steri-Strips, a 4x4 was then fixed to the  skin with Hypafix tape. The right  iliac crest bone graft harvest site after  closure with 4-0 subcu stitch, tincture of Benzoin Steri-Strips applied  here.  4x4s fixed was fixed to skin with Hypafix tape. The patient was then  placed in a  [place] collar. He was then reactivated, extubated  following returned to his bed and returned recovery room in satisfactory  condition. All instrument and sponge counts count again were correct. There  were no complications during this case.      JEN/MEDQ  D:  08/24/2004  T:  08/25/2004  Job:  [id number]

## 2010-11-11 NOTE — Assessment & Plan Note (Signed)
[facility]                              CARDIOLOGY OFFICE NOTE   NASIIR, MONTS                    MRN:          [id number]  DATE:03/05/2006                            DOB:          June 17, 1951    [name] is a 60 year old white male with nonobstructive coronary  disease, good LV function, 50% renal stenosis on the left, hypertension,  hyperlipidemia, tobacco abuse, GERD, cervical spine surgery, and headaches.  He continues to have significant headaches.  He is having no cardiac  symptoms.   MEDICATIONS:  Include  1. Aspirin 81.  2. Triamterene/HCTZ, 37.5/25.  3. Lipitor 10.  Recent LDL of 71.  4. KCl 20.  5. Protonix.   PHYSICAL EXAMINATION:  VITAL SIGNS:  Blood pressure 178/102.  Pulse 77,  normal sinus rhythm.  GENERAL:  Appearance is normal.  NECK:  JVP is not elevated.  Carotid pulses palpable and equal without  bruits.  LUNGS:  Clear.  CARDIAC:  Reveals 1/6 short systolic murmur at the aortic area and apex.  Questionable S4.  ABDOMEN:  Unremarkable.  EXTREMITIES:  Normal.   EKG, there are no specific ST-T changes more significant than December 11, 2005,  noted on previous exam in November 2004.   I suspect these may be due to hypertension.   I suggested started Lotrel 5/20 daily.  He is to have a follow-up BNP and  see Dr. Everardo All in followup and also a neurological evaluation.                              Cecil Cranker, MD, [facility]    EJL/MedQ  DD:  03/05/2006  DT:  03/06/2006  Job #:  [id number]

## 2010-11-11 NOTE — Op Note (Signed)
NAME:  Jeff Lopez, Jeff Lopez             ACCOUNT NO.:  [id number]   MEDICAL RECORD NO.:  [id number]          PATIENT TYPE:  INP   LOCATION:  2899                         FACILITY:  [facility]   PHYSICIAN:  Kerrin Champagne, M.D.   DATE OF BIRTH:  1951/03/29   DATE OF PROCEDURE:  06/13/2005  DATE OF DISCHARGE:                                 OPERATIVE REPORT   PREOPERATIVE DIAGNOSIS:  Herniated nucleus pulposus central right sided C6-  C7 below a previous anterior cervical discectomy and fusion at the C5-C6  level.   POSTOPERATIVE DIAGNOSIS:  Herniated nucleus pulposus central right sided C6-  C7 below a previous anterior cervical discectomy and fusion at the C5-C6  level.   PROCEDURE:  Removal of plates and screws C5-C6, anterior cervical discectomy  and fusion at C6-C7 with internal fixation using a DePuy locking plate 43 mm  in length with 14 mm bail out screws at the C5 and C6 levels, 14 mm regular  screws at the C7 level, right iliac crest bone graft harvested through a  separate incision.   SURGEON:  Kerrin Champagne, M.D.   ASSISTANT:  Wende Neighbors, P.A.-C.   ANESTHESIA:  GOT, Dr. Randa Evens.   ESTIMATED BLOOD LOSS:  25 mL.   DRAINS:  TLS, Foley catheter to straight drain discontinued at the end of  the case.   HISTORY OF PRESENT ILLNESS:  The patient is a 60 year old male who has had a  history of previous anterior discectomy and fusion at C5-C6 for HNP.  He did  well for a short period of time with relief of upper extremity pain then had  increasing discomfort in his neck with radiation to the right arm consistent  with disc protrusion below the previous anterior discectomy and fusion site.  MRI study demonstrated a central disc protrusion at the C6-C7 level.  After  attempts at conservative management over a prolonged period of time, the  patient is requiring large amounts of narcotic medicine to relieve pain,  persists with weakness in a C7 distribution.  He has had selective  block  with only temporary relief of pain.  He is brought to the operating room to  undergo extension of fusion to the C6-C7 level.   OPERATIVE FINDINGS:  He had significant spondylosis involving the  uncovertebral joint at the right C6-C7 central HNP.   DESCRIPTION OF PROCEDURE:  After adequate general anesthesia, the patient in  a beach chair position with a bump under the right iliac crest and the neck  in slight extension with 5 pounds cervical halter retraction, standard prep  with DuraPrep solution over the anterior neck and right iliac crest.  Given  preoperative antibiotics.  Incision over the left neck in the incision line  previously used using 10 blade scalpel after infiltration with Marcaine 0.5%  with 1:200,000 epinephrine.  Total length of the incision approximately 3.5  to 4 cm.  Through the skin and subcu layers down to the platysmal layer.  This was incised in line with the skin incision and the anterior border of  the sternocleidomastoid muscle identified.  Carefully, soft  tissue and scar  freed up over this anterior surface, small bleeders cauterized.  The  interval between the trachea and esophagus medially and the carotid sheath  beneath the omohyoid muscle was then developed using blunt dissection to the  anterior aspect of the cervical spine and the left side longus colli muscle  identified.  The esophagus was mobilized sing Metzenbaum scissors as well as  small pickups.  The plate immediately identified.  A Key elevator was then  used to elevate soft tissue overlying the plate at the G9-F6 level.  The  plate was then removed, turning the locking nuts at each level, then  removing the 14 mm screws.  The plate was then easily removed using a Kocher  clamp.  The area of the previous plate placement was then carefully  debrided, removing any soft tissue over the anterior aspect of the cervical  spine that had formed in the area of previous screw holes.  Next,  exposure  was then performed distally or caudally over the anterior aspect of the  cervical spine to the next adjacent segment of the C6-C7 level.  This was  done using a 15 blade scalpel to incise the disc anteriorly.  The longus  colli muscle then carefully freed up bilaterally using a Key elevator and  electrocautery.  The McCullough retractor inserted with a blade beneath the  medial border of the longus colli muscle exposing the C6-C7 level.  The  anterior lip osteophytes were resected using 3 mm and 2 mm Kerrisons.  The  pituitary was used to excise the disc.  A 14 mm screw post was placed at the  C7 and C6 level distracting the disc space, the operating microscope draped  and brought onto the field.  Under direct visualization, the remaining  portion of the disc was excised using a microcuret debriding the endplates  above cartilaginous disc material as well as degenerative disc material back  to the posterior endplates.  The high speed bur was then used carefully to  decorticate the endplates over the inferior aspect of C6, superior aspect of  C7, back to the posterior lip osteophytes.  These were then carefully  thinned and a 1 mm Kerrison then used to resect the inferior osteophyte  posteriorly over C6 and a posterior superior osteophyte of C7.  Foraminotomy  was performed on the right side over the right C7 nerve root excising a fair  amount of spur from uncovertebral spondylosis present there, decompressing  the right C7 nerve root.  The left C7 nerve root well decompressed using a 2  mm Kerrison to decompress the left uncovertebral joint posteromedially.  The  central portion of the canal posterior uncovertebral joint had been  debrided, both foramen were felt to be wide open.  Irrigation was performed,  hemostasis obtained using thrombin soaked Gelfoam.  The depth of the  intervertebral disc space measured using Cloward depth gauge at about 17 mm height, measured using a  sounder, a green number 8 sounder provided the best  fit.   An incision was made over the right iliac crest through the old incision  scar after infiltration with 0.5% Marcaine with 1:200,000 epinephrine.  The  incision was carried sharply to the superficial portion of the iliac crest  anterolateral.  A trocar was used to control bleeding.  Subperiosteal  dissection then carried over the superior aspect of the crest just posterior  to the previous graft harvest site.  Cobb elevation of the soft tissue  medially and anterior to the crest laterally.  These areas then carefully  protected using Cobb elevators, [employer], and an 8 mm dual blade  oscillating saw was then used to incise the crest, protecting the soft  tissue structures medially and laterally.  Dividing across this space with a  curved osteotome, this graft was carefully tapered in the dimensions of the  intervertebral disc space, 8 mm height, depth of 15 mm.  Carefully rounded  to be keyed into the disc space.  Irrigation performed of the disc space.  Careful inspection demonstrated no soft tissue remaining that could be  retropulsed with insertion of graft.  A guide pin then placed over the disc  space and impacted into place and subset 1 mm.  The anterior lip osteophytes  then resected using a high speed bur.  The screw post distraction was  removed.   Carefully, soft tissue structures over the anterior aspect of the disc space  and superior aspect of the C7 level were then debrided using the high speed  bur as well as electrocautery.  Using the previous old plate length of 25 mm  plate, carefully 43 mm length plate was chosen that could bridge the new  disc space as well as provide for placement of new screws into the old screw  holes at C5 and C6 level.  This area was only a period of about 9 months  since the surgical resection and fusion.  With this in mind, 43 mm plate was  chosen.  The distance between the screw holes  at the C6 level and C7 level  were felt to be about 20 mm.  The plate was then carefully applied to the  anterior aspect of the cervical spine realigned with the old previous holes  at the C5-C6 levels.  The first screw was placed at the C6 level using 14 mm  revision screws.  This provided excellent fixation here.  5 pounds cervical  halter traction was released.  Next, screw holes were placed at the C7 level  using 14 mm drill, drilling appropriately and placing regular 14 mm screws  at this level obtaining excellent fixation.  Each of these screws were then  affixed to the plate by rotating locking screws.  Attention was turned to  the upper aspect of the plate where the screw holes were adjacent and at the  level of the holes for the plate.  14 mm revision screws were then placed at  these levels to obtain excellent fixation.  The screws were then locked to  the plate by turning the locking screw appropriately using the screw driver. This completed fixation at the C5-C6 and C6-C7 levels.  Interoperative  lateral radiograph demonstrated the plates and screws in excellent position  and alignment.  This was on the lateral view.  Irrigation was performed and  inspection of the esophagus demonstrated no abnormalities.  A 10 French TLS  drain was placed in the depth of the incision exiting over the anterior neck  and sewn in place with a 4-0 nylon stitch.  The platysmal layer was  approximated with interrupted 3-0 Vicryl suture after careful inspection of  the soft tissues demonstrated no active bleeding present.  The subcu layers  were reapproximated with interrupted 3-0 Vicryl sutures and the skin was  closed with a running subcu stitch of 4-0 Vicryl.  Tincture of Benzoin and  Steri-Strips were applied.  4 by 4s were affixed to the skin with Hypafix  tape  following charging of the TLS drain.  The right iliac crest bone graft  harvest site was closed prior to charging the drain and dressing  the neck.  This was done by irrigating the incision site and graft site.  Bone wax  applied to the bleeding cancellous bone surfaces with Gelfoam.  The fascial  layer reapproximated using interrupted #1 Vicryl sutures, deep subcu layer  was reapproximated with interrupted 0 Vicryl sutures, the more superficial  layers with interrupted 2-0 Vicryl sutures, and the skin was closed with a  running subcu stitch of 4-0 Vicryl.  Tincture of Benzoin and Steri-Strips  were applied.  4 by 4s affixed to the skin with Hypafix tape.  A  [place] collar was then applied to the neck.  The patient reactivated,  extubated, and returned to the recovery room in satisfactory condition.  All  instrument and sponge counts were correct.      Kerrin Champagne, M.D.  Electronically Signed     JEN/MEDQ  D:  06/13/2005  T:  06/14/2005  Job:  [id number]

## 2010-11-11 NOTE — Cardiovascular Report (Signed)
NAME:  Jeff Lopez, Jeff Lopez NO.:  [id number]   MEDICAL RECORD NO.:  [id number]          PATIENT TYPE:  INP   LOCATION:  2031                         FACILITY:  [facility]   PHYSICIAN:  Arturo Morton. Riley Kill, M.D. [facility] OF BIRTH:  1951/05/01   DATE OF PROCEDURE:  11/24/2005  DATE OF DISCHARGE:                              CARDIAC CATHETERIZATION   INDICATIONS:  The patient is a 60 year old gentleman who presents with chest  pain.  He was seen by Dr. Daleen Squibb; he is been previously seen by Dr. Corinda Gubler.  The current study was done to assess coronary anatomy.   In the laboratory and prepped and draped in usual fashion.  After an  anterior puncture the right femoral artery was easily entered.  A 6-French  sheath was placed.  Views of the left and right coronary arteries were  obtained in multiple angiographic projections.  Central aortic and left  ventricular pressures were measured with a pigtail.  Because of the  patient's initial elevated blood pressure and chest tightness and lack of  high-grade disease in the coronary circulation, proximal root aortogram was  done to exclude dissection.  Distal aortogram was also done as we identified  a left renal artery narrowing.  The patient tolerated the procedure well and  was taken to the holding area in satisfactory clinical condition.   HEMODYNAMIC DATA:  1.  Central aortic pressure 127/77.  2.  Left ventricular pressure 140/14.  3.  No gradient pullback across the aortic valve.   ANGIOGRAPHIC DATA:  1.  The left main is free of critical disease.  2.  The left anterior descending artery demonstrates about 20-30% narrowing      at the ostium.  There is mild bifurcational irregularity that does not      appear to be critical.  There are two major diagonal branches.  The LAD      demonstrates about 50% __________  the origins of two septal perforating      vessels.  The vessel then provides an additional diagonal and wraps the  apex and is without critical disease.  3.  The circumflex as previously noted has about 20-30% proximal narrowing      and then there is some mild bifurcational plaquing at the origins of the      AV circumflex and takeoff of the major marginal branch.  This does not      exceed about 20-30% luminal reduction and does not appear to be tight.      The vessel provides a very large posterior descending and large      posterolateral branch.  There is some mild luminal irregularity in the      continuation portion of the right coronary artery after the takeoff of      the PD, but this does not appear to be high-grade.  4.  Ventriculography in the RAO projection reveals preserved global systolic      function.  I do not appreciate significant mitral regurgitation.  No      definite wall motion abnormalities are visualized.  5.  The proximal aortic root  appears to be smooth.  I do not appreciate      significant aortic regurgitation.  The aortic leaflets open well.  There      is no obvious evidence of aortic dissection. Dr. Samule Ohm and I reviewed      this.   CONCLUSIONS:  1.  Well-preserved left ventricular function.  2.  Approximate 50% segmental plaque at the mid left anterior descending      artery.  3.  Scattered irregularities in the circumflex as described above.  4.  A 50% left renal artery stenosis.   DISPOSITION:  1.  Discontinuation of smoking.  2.  Lipid reduction as appropriate.  3.  Hypertension control.  4.  Improved lifestyle.   Dictation ended at this point.      Arturo Morton. Riley Kill, M.D. [facility]  Electronically Signed     TDS/MEDQ  D:  11/24/2005  T:  11/24/2005  Job:  [id number]   cc:   Cecil Cranker, M.D.  [address]  [address]  [place]  [place] [address]   [facility] Laboratory

## 2010-11-11 NOTE — Discharge Summary (Signed)
NAME:  Jeff Lopez, Jeff Lopez NO.:  [id number]   MEDICAL RECORD NO.:  [id number]          PATIENT TYPE:  INP   LOCATION:  2031                         FACILITY:  [facility]   PHYSICIAN:  Rollene Rotunda, M.D.   DATE OF BIRTH:  June 01, 1951   DATE OF ADMISSION:  11/23/2005  DATE OF DISCHARGE:  11/25/2005                                 DISCHARGE SUMMARY   PRIMARY CARDIOLOGIST:  Dr. Graceann Congress   PRIMARY CARE PHYSICIAN:  Dr. Romero Belling   PRINCIPAL DIAGNOSIS:  Chest pain.   OTHER DIAGNOSES:  1.  Nonobstructive coronary artery disease.  2.  Hypertension.  3.  Hyperlipidemia.  4.  Ongoing tobacco abuse.  5.  Gastroesophageal reflux disease.  6.  Degenerative joint disease.  7.  History of cervical spine surgery x2.   ALLERGIES:  No known drug allergies.   PROCEDURE:  Left heart cardiac catheterization.   HISTORY OF PRESENT ILLNESS:  A 60 year old white male with no prior history  of CAD with negative stress test in 2004.  He was in his usual state of  health until approximately 1 week when he began experiencing general malaise  with intermittent chest pressure associated shortness of breath and  diaphoresis.  On the day of admission he was feeling poorly and noted his  blood pressure to be elevated at 174/115.  While at work he had additional  chest pain prompting him to present to [facility] where he was  treated with sublingual nitroglycerin with some relief of discomfort.  He  was subsequently transferred to [facility] for further evaluation and  admitted.   HOSPITAL COURSE:  Mr. Goodrich ruled out for MI and decision was made to  pursue left heart cardiac catheterization which took place on November 24 2005.  Catheterization revealed a 50% stenosis of the mid LAD with a 30-40%  stenosis at the bifurcation of the circumflex and the obtuse marginal.  The  right coronary artery was normal.  He did have a 50% left renal artery  stenosis.  EF was normal.  Decision  was made to continue medical therapy  including aspirin and as well as the addition of low-dose statin.  It was  noted in his medical record that he has been using Excedrin Migraine between  six and eight times per day which equate to approximately 2 g of both  aspirin and acetaminophen on a daily basis.  He was counseled regarding  safety concerns with such high doses and has been advised to follow up with  his primary care physician for alternative migraine therapy.  He was  otherwise feeling well and is being discharged home in satisfactory  condition.   DISCHARGE LABORATORY:  Hemoglobin 15.0, hematocrit 43.7, wbc's 9.7 platelets  179.  Sodium 141, potassium 4.4, chloride 106, CO2 32, BUN 11, creatinine  1.3, glucose 98, total bilirubin 1.7, alkaline phosphatase 65, AST 18, ALT  23, total protein 5.8, albumin 3.4, calcium 87.  Hemoglobin A1c 5.7.  Cardiac markers were negative x1.  Total cholesterol 150, triglycerides 74,  HDL 34, LDL 101.  TSH 0.563.   DISPOSITION:  The patient is discharged home today in good condition.   FOLLOWUP PLANS AND APPOINTMENTS:  He is asked to follow up with his primary  care physician, Dr. Romero Belling, in 1-2 weeks.  To follow up with Dr.  Corinda Gubler in approximately 2 weeks.   DISCHARGE MEDICATIONS:  1.  Aspirin 81 mg daily.  2.  Triamterene/hydrochlorothiazide 37.5/25 mg daily.  3.  Lipitor 10 mg q.h.s.  4.  Potassium chloride 20 mEq daily.  5.  Protonix 40 mg daily.   Duration of discharge encounter 35 minutes including physician time, patient  counseling, importance of smoking cessation.      Ok Anis, NP    ______________________________  Rollene Rotunda, M.D.    CRB/MEDQ  D:  11/25/2005  T:  11/25/2005  Job:  [id number]

## 2010-11-11 NOTE — H&P (Signed)
NAME:  Jeff Lopez, Jeff Lopez NO.:  [id number]   MEDICAL RECORD NO.:  [id number]          PATIENT TYPE:  EMS   LOCATION:  MAJO                         FACILITY:  [facility]   PHYSICIAN:  Jesse Sans. Wall, M.D.   DATE OF BIRTH:  01/29/1951   DATE OF ADMISSION:  11/23/2005  DATE OF DISCHARGE:                                HISTORY & PHYSICAL   PRIMARY CARE PHYSICIAN:  Sean A. Everardo All, M.D.   PRIMARY CARDIOLOGIST:  Cecil Cranker, M.D., but has not been seen in 3  years.   CHIEF COMPLAINT:  Chest pain.   HISTORY OF PRESENT ILLNESS:  Mr. Mahan is a 60 year old male with no  previous known history of coronary artery disease.  He was seen by Dr. Glennon Hamilton in 2004 for cardiac risk factors, and had a stress test at that  time, which was negative for ischemia.  He had an EF by adenosine Cardiolite  of 44%, but the note comments that it appeared higher.   Since that time, Mr. Hogeland has done well until about a week ago; then he  began experiencing general malaise, as well as a pressure type chest pain  that was relieved by certain movements at times.  He complained of fatigue,  as well as increased dyspnea on exertion.  With the chest pain, he would  have diaphoresis and shortness of breath.  He was getting episodes daily.  They would reach a 3 or 4/10.  They were at rest and with exertion.   Today, Mr. Shill was feeling bad and took his blood pressure, which he  says was 174/115.  He went to work, and when he had more chest pain, he told  someone at work, who made him go to [facility].  There, he received  sublingual nitroglycerin, which helped his pain.  EMS was called, and he was  transported to [facility].  Currently, he is experiencing pain at a  3/10, which is described as a pressure.  He had shortness of breath and  diaphoresis earlier, but not now.  Of note, he also complains of a daily  headache for the last 2-3 years.  He says he was evaluated  at the [facility] and was told it was secondary to neck problems, which have improved,  although the headaches have not changed.  He denies coffee intake, but  states that he drinks [facility], as well as a lot of tea, and takes 6-8  Excedrin Migraines per day (recommended dose is 1 or 2 per day).   PAST MEDICAL HISTORY:  1.  History of hypertension.  2.  Hyperlipidemia.  3.  Ongoing tobacco use.  4.  Family history of coronary artery disease.  5.  History of gastroesophageal reflux disease symptoms.  6.  Degenerative joint disease, especially in his neck.   SURGICAL HISTORY:  He is status post C-spine surgery x2.   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  He smokes about a pack of cigarettes a day and has  approximately  a 45 pack year history.  He denies alcohol or drug abuse.  He  does not exercise and does not eat a healthy diet.   ALLERGIES:  No known drug allergies.   CURRENT MEDICATIONS:  1.  Protonix daily.  2.  Triamterene HCTZ, believed to be 37.5/25 mg daily.  3.  Of note, he was on lovastatin 80 mg q.h.s., but stopped this.   FAMILY HISTORY:  His father is alive at age 85 with no history of heart  disease.  His mother died at age 78 of a massive heart attack.  He has one  brother who is alive with a history of hypertension, but no heart disease.   REVIEW OF SYSTEMS:  He denies any recent fevers or chills or weight change.  Caffeine use and tobacco use as described above.  Occasionally states his  urine appears very dark yellow and concentrated.  He admits that he does not  drink a lot of water.  He denies hematemesis, hemoptysis or melena.  Review  of systems is otherwise negative.   PHYSICAL EXAMINATION:  VITAL SIGNS:  His temperature is 98 with a blood  pressure of 135/84, heart rate 74, respiratory rate 20, O2 saturation 95% on  room air.  GENERAL:  He is a well-developed, well-nourished white  male in mild  distress.  HEENT:  His head is normocephalic and atraumatic.  Pupils equal, round and  reactive to light and accommodation.  Sclerae are clear.  Nares without  discharge.  NECK:  There is no lymphadenopathy, thyromegaly, bruit of JVD noted.  CHEST:  Essentially clear to auscultation bilaterally.  CARDIOVASCULAR:  His heart is regular in rate and rhythm with an S1 and S2  and no significant murmur, rub, or gallop noted.  SKIN:  No rashes or lesions are noted.  ABDOMEN:  Soft and nontender with active bowel sounds.  EXTREMITIES:  He has 2+ pulses in all 4 extremities and no femoral bruits  are appreciated.  There is no clubbing, cyanosis, or edema.  MUSCULOSKELETAL:  There is no joint deformity or effusions and no spine or  CVA tenderness.  NEUROLOGIC:  He is alert and oriented.  Cranial nerves II-XII are grossly  intact.   LABORATORY DATA:  Laboratory values are pending at the time of dictation.   CHEST X-RAY:  Pending at the time of dictation.   ELECTROCARDIOGRAM:  Electrocardiogram performed at [facility] shows  sinus rhythm without acute electrocardiogram changes, but R waves are  enlarged precordially from an electrocardiogram dated February of 2006.  A  right-sided electrocardiogram did not show any acute ischemic changes.   IMPRESSION:  Chest pain.  He has had substernal chest pain with diaphoresis  for 2-3 days.  He has 7/9 cardiac risk factors with no history of diabetes  and no history of vascular disease as his only negatives.  His  electrocardiogram shows increased R waves in V2 and V3 compared to an  electrocardiogram in the past.  A right electrocardiogram was without  diagnostic criteria for a right ventricular infarction.  His initial point  of care markers were just received, and the CK-MB and troponin I were  negative and the myoglobin was mildly elevated at 254.  He has had constant pain for 5-1/2 hours.  Of note, after he received 2 mg of  morphine for chest  pain and a headache, as well as was started on IV nitroglycerin at 3 cc, he  had some hypotension, bradycardia and nausea.  This was relieved with  Zofran.  The plan will be to admit him to telemetry.  He will be started on  IV heparin and IV nitroglycerin, which will be titrated for resolution of  chest pain.  He will be started on daily aspiration.  He will be started on  a low-dose beta blocker as tolerated, but watch for bradycardia, which he  had briefly when he was nauseated.  He has agreed to a cardiac  catheterization with the risks and benefits discussed, which will be  performed in the a.m., or sooner if his  cardiac enzymes are elevated.  Aggressive cardiac risk factors reduction  will be pursued, and laboratory results will be followed.   Dr. Juanito Doom saw the patient and determined the plan of care.      Theodore Demark, P.A. [facility]      Thomas C. Wall, M.D.  Electronically Signed    RB/MEDQ  D:  11/23/2005  T:  11/23/2005  Job:  [id number]

## 2010-11-27 ENCOUNTER — Telehealth: Payer: Self-pay | Admitting: Endocrinology

## 2010-11-27 DIAGNOSIS — N281 Cyst of kidney, acquired: Secondary | ICD-10-CM

## 2010-11-27 NOTE — Telephone Encounter (Signed)
(  f/u renal US is due)

## 2010-11-30 ENCOUNTER — Other Ambulatory Visit: Payer: Self-pay | Admitting: Podiatry

## 2010-11-30 DIAGNOSIS — M79672 Pain in left foot: Secondary | ICD-10-CM

## 2010-11-30 DIAGNOSIS — M79671 Pain in right foot: Secondary | ICD-10-CM

## 2010-12-06 ENCOUNTER — Other Ambulatory Visit: Payer: 59

## 2010-12-15 ENCOUNTER — Other Ambulatory Visit: Payer: Self-pay | Admitting: Endocrinology

## 2010-12-15 NOTE — Telephone Encounter (Signed)
Pt states that he has an appointment to see Dr. Otelia Sergeant on the 27th of this month.

## 2010-12-15 NOTE — Telephone Encounter (Signed)
Please advise 

## 2010-12-15 NOTE — Telephone Encounter (Signed)
Last fill of this med was until he got back to dr Otelia Sergeant.  Did he go?  What happened there?

## 2010-12-15 NOTE — Telephone Encounter (Signed)
Pt informed. Rx upfront in cabinet, ready for pickup.

## 2010-12-21 ENCOUNTER — Ambulatory Visit
Admission: RE | Admit: 2010-12-21 | Discharge: 2010-12-21 | Disposition: A | Discharge status: Home / Self Care | Payer: 59 | Source: Ambulatory Visit | Attending: Podiatry | Admitting: Podiatry

## 2010-12-21 DIAGNOSIS — M79672 Pain in left foot: Secondary | ICD-10-CM

## 2010-12-21 DIAGNOSIS — M79671 Pain in right foot: Secondary | ICD-10-CM

## 2010-12-21 MED ORDER — GADOBENATE DIMEGLUMINE 529 MG/ML IV SOLN
15.0000 mL | Freq: Once | INTRAVENOUS | Status: AC | PRN
  Administered 2010-12-21: 15 mL via INTRAVENOUS

## 2011-01-05 ENCOUNTER — Encounter: Payer: Self-pay | Admitting: Physician Assistant

## 2011-01-11 ENCOUNTER — Encounter: Payer: Self-pay | Admitting: Physician Assistant

## 2011-01-11 ENCOUNTER — Ambulatory Visit (INDEPENDENT_AMBULATORY_CARE_PROVIDER_SITE_OTHER): Payer: 59 | Admitting: Physician Assistant

## 2011-01-11 DIAGNOSIS — I2510 Atherosclerotic heart disease of native coronary artery without angina pectoris: Secondary | ICD-10-CM

## 2011-01-11 DIAGNOSIS — E785 Hyperlipidemia, unspecified: Secondary | ICD-10-CM

## 2011-01-11 DIAGNOSIS — I10 Essential (primary) hypertension: Secondary | ICD-10-CM

## 2011-01-11 DIAGNOSIS — Z01810 Encounter for preprocedural cardiovascular examination: Secondary | ICD-10-CM | POA: Insufficient documentation

## 2011-01-11 DIAGNOSIS — R9431 Abnormal electrocardiogram [ECG] [EKG]: Secondary | ICD-10-CM | POA: Insufficient documentation

## 2011-01-11 DIAGNOSIS — F17200 Nicotine dependence, unspecified, uncomplicated: Secondary | ICD-10-CM

## 2011-01-11 NOTE — Assessment & Plan Note (Signed)
We discussed the importance of tobacco cessation 

## 2011-01-11 NOTE — Progress Notes (Signed)
History of Present Illness: Primary Cardiologist: [name] is a 60 y.o. male with a h/o nonobstructive CAD who presents for surgical clearance.  He last saw Dr. Excell Lopez in 03/2010.  He was having chest pain at that time and was to have a Myoview.  This was never done.  He needs lumbar spine surgery due to DDD.  He is having a lot of leg pain.  His activity level has dropped off a lot.  He is definitely not doing as much as he was when he last saw Dr. Excell Lopez.  He denies chest pain.  He denies dyspnea.  No orthopnea, PND, edema, syncope, near syncope or palpitations.  He is taking all of his medications.  Past Medical History  Diagnosis Date  . HYPERLIPIDEMIA 04/09/2007  . HYPERTENSION, BENIGN 04/19/2010  . CAD, NATIVE VESSEL 04/19/2010    nonobstructive by cath 2007:  oLAD 20-30%, mLAD 50%, pCFX 20-30%, oAVCFX 20-30%, L renal art 50%;  normal LVF  . GERD 04/09/2007  . RENAL CYST 05/27/2010  . OSTEOARTHRITIS, LUMBAR SPINE 04/09/2007  . LUMBAR DISC DISORDER 05/27/2010  . HYPERGLYCEMIA 04/09/2007    Current Outpatient Prescriptions  Medication Sig Dispense Refill  . losartan-hydrochlorothiazide (HYZAAR) 100-12.5 MG per tablet Take 1 tablet by mouth daily.        [name] oxyCODONE-acetaminophen (PERCOCET) 7.5-325 MG per tablet As needed for back pain      . pantoprazole (PROTONIX) 40 MG tablet Take 40 mg by mouth daily.        . simvastatin (ZOCOR) 20 MG tablet Take 20 mg by mouth at bedtime.          Allergies: No Known Allergies  Social Hx:  Still smoking 1/2 PPD.  He works at [employer].  Family Hx:  Mom died of MI in her 73s.  ROS:  See HPI.  No fevers, chills, cough, melena, hematochezia.  All other systems reviewed and negative.  Vital Signs: BP 123/80  Pulse 83  Ht 5\' 8"  (1.727 m)  Wt 182 lb (82.555 kg)  BMI 27.67 kg/m2  PHYSICAL EXAM: Well nourished, well developed, in no acute distress HEENT: normal Neck: no JVD Vascular: no  carotid bruits Endocrine:  No thyromegaly Cardiac:  normal S1, S2; RRR; no murmur Lungs:  clear to auscultation bilaterally, no wheezing, rhonchi or rales Abd: soft, nontender, no hepatomegaly Ext: no edema Skin: warm and dry Neuro:  CNs 2-12 intact, no focal abnormalities noted Psych: normal affect  EKG:  NSR, HR 68, normal axis, TW inversions 3, aVF (of note, inferior TW changes not present on prior ECG available for review)  ASSESSMENT AND PLAN:

## 2011-01-11 NOTE — Assessment & Plan Note (Addendum)
He has some new EKG changes.  He had 50% mid LAD stenosis in 2007 at cath.  He was to have a stress test last October.  He denies chest pain.  However, his functional status is limited by his back issues.  Of note, he also has a fractured right foot.  He has multiple cardiac risk factors, including ongoing tobacco abuse.  I recommend that he have a Lexiscan Myoview prior to clearing him for surgery.  This will be arranged for tomorrow.   Myoview completed 7/23.  This is low risk.  Therefore, he requires no further cardiac testing prior to his non cardiac surgery.  He should be at acceptable risk.

## 2011-01-11 NOTE — Assessment & Plan Note (Signed)
As noted, proceed with Myoview tomorrow.

## 2011-01-11 NOTE — Assessment & Plan Note (Signed)
Managed by PCP

## 2011-01-11 NOTE — Patient Instructions (Signed)
Your physician has requested that you have a lexiscan myoview [phone] 01/12/11 @ 11:30 OK PER TANYA IN NUC. For further information please visit [email or web address] Please follow instruction sheet, as given.  Your physician wants you to follow-up in: 6 MONTHS WITH [name]. You will receive a reminder letter in the mail two months in advance. If you don't receive a letter, please call our office to schedule the follow-up appointment.

## 2011-01-11 NOTE — Assessment & Plan Note (Signed)
He will need to remain on his beta blocker peri-operatively.

## 2011-01-11 NOTE — Assessment & Plan Note (Signed)
Controlled.  Continue current therapy.  

## 2011-01-12 ENCOUNTER — Other Ambulatory Visit (HOSPITAL_COMMUNITY): Payer: 59 | Admitting: Radiology

## 2011-01-12 ENCOUNTER — Ambulatory Visit (HOSPITAL_COMMUNITY): Payer: 59 | Attending: Cardiovascular Disease | Admitting: Radiology

## 2011-01-12 DIAGNOSIS — R0602 Shortness of breath: Secondary | ICD-10-CM

## 2011-01-12 DIAGNOSIS — R9431 Abnormal electrocardiogram [ECG] [EKG]: Secondary | ICD-10-CM | POA: Insufficient documentation

## 2011-01-12 DIAGNOSIS — Z01810 Encounter for preprocedural cardiovascular examination: Secondary | ICD-10-CM | POA: Insufficient documentation

## 2011-01-12 MED ORDER — TECHNETIUM TC 99M TETROFOSMIN IV KIT
11.0000 | PACK | Freq: Once | INTRAVENOUS | Status: AC | PRN
  Administered 2011-01-12: 11 via INTRAVENOUS

## 2011-01-13 ENCOUNTER — Encounter: Payer: Self-pay | Admitting: *Deleted

## 2011-01-16 ENCOUNTER — Ambulatory Visit (HOSPITAL_COMMUNITY): Payer: 59 | Attending: Cardiovascular Disease | Admitting: Radiology

## 2011-01-16 DIAGNOSIS — Z8249 Family history of ischemic heart disease and other diseases of the circulatory system: Secondary | ICD-10-CM | POA: Insufficient documentation

## 2011-01-16 DIAGNOSIS — Z01810 Encounter for preprocedural cardiovascular examination: Secondary | ICD-10-CM | POA: Insufficient documentation

## 2011-01-16 DIAGNOSIS — F17200 Nicotine dependence, unspecified, uncomplicated: Secondary | ICD-10-CM | POA: Insufficient documentation

## 2011-01-16 DIAGNOSIS — R0989 Other specified symptoms and signs involving the circulatory and respiratory systems: Secondary | ICD-10-CM | POA: Insufficient documentation

## 2011-01-16 DIAGNOSIS — I10 Essential (primary) hypertension: Secondary | ICD-10-CM | POA: Insufficient documentation

## 2011-01-16 DIAGNOSIS — R0609 Other forms of dyspnea: Secondary | ICD-10-CM | POA: Insufficient documentation

## 2011-01-16 MED ORDER — TECHNETIUM TC 99M TETROFOSMIN IV KIT
32.8000 | PACK | Freq: Once | INTRAVENOUS | Status: AC | PRN
  Administered 2011-01-16: 32.8 via INTRAVENOUS

## 2011-01-16 MED ORDER — REGADENOSON 0.4 MG/5ML IV SOLN
0.4000 mg | Freq: Once | INTRAVENOUS | Status: AC
  Administered 2011-01-16: 0.4 mg via INTRAVENOUS

## 2011-01-16 NOTE — Progress Notes (Signed)
[facility] SITE 3 NUCLEAR MED [address] [place] [address] [phone]  Cardiology Nuclear Med Study  Jeff Lopez is a 61 y.o. male [id number] 1951/05/01   Nuclear Med Background Indication for Stress Test:  Evaluation for Ischemia and Surgical Clearance: Pending back surgery by Dr. Vira Browns History:  '07 Cath:N/O CAD; '09 LKG:MWNU. normal, EF=55% Cardiac Risk Factors: Family History - CAD, Hypertension, Lipids and Smoker  Symptoms:  DOE   Nuclear Pre-Procedure Caffeine/Decaff Intake:  None NPO After: 9:30pm   Lungs:  Clear.  O2 sat 98% IV 0.9% NS with Angio Cath:  20g  IV Site: R Hand  IV Started by:  Irean Hong, RN  Chest Size (in):  44 Cup Size: n/a  Height: 5\' 11"  (1.803 m)  Weight:  183 lb (83.008 kg)  BMI:  Body mass index is 25.52 kg/(m^2). Tech Comments:  Held metoprolol x 24 hrs    Nuclear Med Study 1 or 2 day study: 2 day  Stress Test Type:  Lexiscan  Reading MD: Dietrich Pates, MD  Order Authorizing Provider:  Tonny Bollman, MD  Resting Radionuclide: Technetium 28m Tetrofosmin  Resting Radionuclide Dose: 11.0 mCi   Stress Radionuclide:  Technetium 17m Tetrofosmin  Stress Radionuclide Dose: 32.8 mCi           Stress Protocol Rest HR: 71 Stress HR: 95  Rest BP: 103/76 Stress BP: 118/71  Exercise Time (min): n/a METS: n/a   Predicted Max HR: 162 bpm % Max HR: 58.64 bpm Rate Pressure Product: 27253   Dose of Adenosine (mg):  n/a Dose of Lexiscan: 0.4 mg  Dose of Atropine (mg): n/a Dose of Dobutamine: n/a mcg/kg/min (at max HR)  Stress Test Technologist: Smiley Houseman, CMA-N  Nuclear Technologist:  Domenic Polite, CNMT     Rest Procedure:  Myocardial perfusion imaging was performed at rest 45 minutes following the intravenous administration of Technetium 33m Tetrofosmin.  Rest ECG: No acute changes.  Stress Procedure:  The patient received IV Lexiscan 0.4 mg over 15-seconds.  Technetium 6m Tetrofosmin injected at  30-seconds.  There was a hypotensive response with infusion, relieved with trendelenburg and nonspecific T-wave changes  Quantitative spect images were obtained after a 45 minute delay.  Stress ECG: No ST changes to suggest ischemia.  QPS Raw Data Images:  Images were motion corrected.  Soft tissue (diaphragm) underlies the heart. Stress Images:  Thinning in the inferior wall (mid, distal) best seen in the short axis views .  Apical thinning.  Otherwise normal perfusion. Rest Images: Improvement in the apex. Subtraction (SDS):  No significant ischemia. Transient Ischemic Dilatation (Normal <1.22):  1.07 Lung/Heart Ratio (Normal <0.45):  0.25  Quantitative Gated Spect Images QGS EDV:  113 ml QGS ESV:  44 ml QGS cine images:  NL LV Function; NL Wall Motion QGS EF: 61%  Impression Exercise Capacity:  Lexiscan with no exercise. BP Response:  Normal blood pressure response. Clinical Symptoms:  No chest pain. ECG Impression:  No significant ST segment change suggestive of ischemia. Comparison with Prior Nuclear Study: Inferior and apical changes are more prominent.  Overall Impression: Inferior thinning consistent with probable soft tissue attenuation.  Apical thinning that improves but does not appear to be significant quantitatively.  Otherwise normal perfusion elsewhere.  Overall low risk scan.  Dietrich Pates    .

## 2011-01-17 ENCOUNTER — Telehealth: Payer: Self-pay | Admitting: Physician Assistant

## 2011-01-17 NOTE — Progress Notes (Signed)
nuc med report routed to Dr.Cooper 01/17/11 Jeff Lopez

## 2011-01-17 NOTE — Telephone Encounter (Signed)
Please notify Jeff Lopez his stress test is ok.   Please fax a copy of his stress test and my office visit note to his surgeon.

## 2011-01-18 ENCOUNTER — Telehealth: Payer: Self-pay | Admitting: Cardiovascular Disease

## 2011-01-18 NOTE — Telephone Encounter (Signed)
LMTCB

## 2011-01-18 NOTE — Telephone Encounter (Signed)
Test result

## 2011-01-18 NOTE — Telephone Encounter (Signed)
Tereso Newcomer, [place] 01/17/2011 4:59 PM Signed  Please notify Mr. Krueger his stress test is ok.  Please fax a copy of his stress test and my office visit note to his surgeon 01/08/11--I talked with pt. He is aware his stress test is OK and I will fax a copy of stress test and Scott's office note  to his surgeon, Dr Otelia Sergeant [phone]

## 2011-01-18 NOTE — Telephone Encounter (Signed)
Scott's LOV,Stress Faxed to Dr.Nitka's Office @ [phone]  01/18/11/km

## 2011-01-20 ENCOUNTER — Other Ambulatory Visit: Payer: Self-pay

## 2011-01-20 MED ORDER — OXYCODONE-ACETAMINOPHEN 7.5-325 MG PO TABS: 1.0000 | ORAL_TABLET | ORAL | Status: DC | PRN

## 2011-01-20 NOTE — Telephone Encounter (Signed)
i printed 

## 2011-01-20 NOTE — Telephone Encounter (Signed)
Pt informed, Rx in cabinet for pt pick up  

## 2011-01-20 NOTE — Telephone Encounter (Signed)
Pt called stating he is scheduled to have back surgery with Dr Otelia Sergeant 08/06 on his back. Pt is requesting refill of pain medication to last until surgery, please advise.

## 2011-01-21 ENCOUNTER — Other Ambulatory Visit: Payer: Self-pay | Admitting: Gastroenterology

## 2011-01-25 ENCOUNTER — Encounter (HOSPITAL_COMMUNITY)
Admission: RE | Admit: 2011-01-25 | Discharge: 2011-01-25 | Disposition: A | Discharge status: Home / Self Care | Payer: 59 | Source: Ambulatory Visit | Attending: Specialist | Admitting: Specialist

## 2011-01-25 ENCOUNTER — Telehealth: Payer: Self-pay | Admitting: *Deleted

## 2011-01-25 ENCOUNTER — Other Ambulatory Visit: Payer: Self-pay | Admitting: Specialist

## 2011-01-25 DIAGNOSIS — M545 Low back pain, unspecified: Secondary | ICD-10-CM

## 2011-01-25 LAB — COMPREHENSIVE METABOLIC PANEL
ALT: 30 U/L (ref 0–53)
Alkaline Phosphatase: 94 U/L (ref 39–117)
CO2: 25 mEq/L (ref 19–32)
Chloride: 103 mEq/L (ref 96–112)
GFR calc Af Amer: >60 mL/min (ref >60)
Glucose, Bld: 118 mg/dL — ABNORMAL HIGH (ref 70–99)
Potassium: 4.2 mEq/L (ref 3.5–5.1)
Sodium: 141 mEq/L (ref 135–145)
Total Bilirubin: 0.5 mg/dL (ref 0.3–1.2)
Total Protein: 6.7 g/dL (ref 6.0–8.3)

## 2011-01-25 LAB — URINALYSIS, ROUTINE W REFLEX MICROSCOPIC
Bilirubin Urine: NEGATIVE
Ketones, ur: NEGATIVE mg/dL
Nitrite: NEGATIVE
Protein, ur: NEGATIVE mg/dL
pH: 5.5 (ref 5.0–8.0)

## 2011-01-25 LAB — URINE MICROSCOPIC-ADD ON

## 2011-01-25 LAB — SURGICAL PCR SCREEN
MRSA, PCR: NEGATIVE
Staphylococcus aureus: NEGATIVE

## 2011-01-25 LAB — DIFFERENTIAL
Eosinophils Absolute: 0.3 10*3/uL (ref 0.0–0.7)
Eosinophils Relative: 4 % (ref 0–5)
Lymphocytes Relative: 22 % (ref 12–46)
Lymphs Abs: 1.7 10*3/uL (ref 0.7–4.0)
Monocytes Absolute: 0.7 10*3/uL (ref 0.1–1.0)

## 2011-01-25 LAB — CBC
HCT: 47.8 % (ref 39.0–52.0)
MCH: 33.8 pg (ref 26.0–34.0)
MCHC: 36.2 g/dL — ABNORMAL HIGH (ref 30.0–36.0)
MCV: 93.4 fL (ref 78.0–100.0)
Platelets: 191 10*3/uL (ref 150–400)
RDW: 12.5 % (ref 11.5–15.5)

## 2011-01-25 LAB — TYPE AND SCREEN

## 2011-01-25 NOTE — Telephone Encounter (Signed)
Pt aware of stress test reuslts and is having his surgery Monday 01/30/11. Danielle Rankin

## 2011-01-26 ENCOUNTER — Ambulatory Visit
Admission: RE | Admit: 2011-01-26 | Discharge: 2011-01-26 | Disposition: A | Discharge status: Home / Self Care | Payer: 59 | Source: Ambulatory Visit | Attending: Specialist | Admitting: Specialist

## 2011-01-26 DIAGNOSIS — M545 Low back pain, unspecified: Secondary | ICD-10-CM

## 2011-01-30 ENCOUNTER — Inpatient Hospital Stay (HOSPITAL_COMMUNITY)
Admission: RE | Admit: 2011-01-30 | Discharge: 2011-02-03 | DRG: 460 | Disposition: A | Discharge status: Home / Self Care | Payer: 59 | Source: Ambulatory Visit | Attending: Specialist | Admitting: Specialist

## 2011-01-30 ENCOUNTER — Inpatient Hospital Stay (HOSPITAL_COMMUNITY): Payer: 59

## 2011-01-30 DIAGNOSIS — Z01812 Encounter for preprocedural laboratory examination: Secondary | ICD-10-CM

## 2011-01-30 DIAGNOSIS — I10 Essential (primary) hypertension: Secondary | ICD-10-CM | POA: Diagnosis present

## 2011-01-30 DIAGNOSIS — M5126 Other intervertebral disc displacement, lumbar region: Secondary | ICD-10-CM | POA: Diagnosis present

## 2011-01-30 DIAGNOSIS — M5137 Other intervertebral disc degeneration, lumbosacral region: Principal | ICD-10-CM | POA: Diagnosis present

## 2011-01-30 DIAGNOSIS — M51379 Other intervertebral disc degeneration, lumbosacral region without mention of lumbar back pain or lower extremity pain: Principal | ICD-10-CM | POA: Diagnosis present

## 2011-01-30 DIAGNOSIS — I2510 Atherosclerotic heart disease of native coronary artery without angina pectoris: Secondary | ICD-10-CM | POA: Diagnosis present

## 2011-01-31 LAB — BASIC METABOLIC PANEL
BUN: 15 mg/dL (ref 6–23)
Calcium: 7.6 mg/dL — ABNORMAL LOW (ref 8.4–10.5)
GFR calc non Af Amer: >60 mL/min (ref >60)
Glucose, Bld: 150 mg/dL — ABNORMAL HIGH (ref 70–99)
Sodium: 139 mEq/L (ref 135–145)

## 2011-01-31 NOTE — Op Note (Signed)
NAME:  Jeff Lopez, Jeff Lopez NO.:  [id number]  MEDICAL RECORD NO.:  [id number]  LOCATION:  5004                         FACILITY:  [facility]  PHYSICIAN:  Kerrin Champagne, M.D.   DATE OF BIRTH:  Jul 07, 1950  DATE OF PROCEDURE:  01/30/2011 DATE OF DISCHARGE:                              OPERATIVE REPORT   PREOPERATIVE DIAGNOSES: 1. Bilateral spondylysis at L5-S1, left L5     radiculopathy. 2. Lumbar degenerative disk disease, L4-5 with recent herniated     nucleus pulposus.  POSTOPERATIVE DIAGNOSES: 1. Bilateral spondylysis at the L5-S1, left L5     radiculopathy. 2. Lumbar degenerative disk disease, L4-5 with recent herniated     nucleus pulposus. 3. Left L5 nerve root entrapment secondary to scar and hypertrophic     changes about the left pars interarticularis defect. 4. Loose neural arch at the L5 level with hypertrophic changes     bilaterally. 5. Degenerative disk disease L4-5.  PROCEDURES: 1. Gill procedure at the L5 level with resection of the central     portions of the neural arch. 2. Bilateral inferior articular processes of L5. 3. Decompression of bilateral L5-S1 nerve roots and left L4 nerve     root. 4. Left L4-5 transforaminal lumbar interbody fusion using a 10-mm     DePuy Concorde lordotic cage, local bone graft. 5. Left L5-S1 transforaminal lumbar interbody fusion using an 11-mm     Concorde DePuy lordotic cage, L5-S1, with local bone graft. 6. Posterior lateral fusion L4-S1 utilizing combination of local bone     graft and master graft with bone marrow aspirate via the left L4     transpedicular aspiration of vertebral body bone marrow. 7. Fixation from L4-S1, 3 vertebral segments and 2 intervening distal     segments utilizing DePuy Expedium pedicle screws and rods with a     single cross-link at the L5-S1 level.  Cell Saver used during this     case.  SURGEON:  Kerrin Champagne, MD  ASSISTANT:  Wende Neighbors, PA  ANESTHESIA:  General via  oral tracheal intubation, Quita Skye. Krista Blue, MD  ESTIMATED BLOOD LOSS:  1200 mL.  CELL SAVER RETURN:  575 mL of hyper hematocrit.  COMPLICATIONS:  None.  DRAINS:  Foley to straight drain and Hemovac, left lower lumbar spine, L5-S1.  The patient returned to the PACU in good condition.  HISTORY OF PRESENT ILLNESS:  The patient is a 60 year old male who has been followed for nearly a year and a half to 2 years with persistent back pain and radiation into his left lower extremity.  He has seen gradual decline in his function and difficulty with standing and ambulation, radiation in L5 distribution in the left lower extremity. He has undergone attempts at conservative management including therapy, ESIs, and time.  The patient has had persistent pain discomfort, requires large amounts of narcotics to relieve his pain.  His findings were consistent with an L5 radiculopathy.  His studies have showed bilateral spondylolysis at the L5-S1 level with a left-sided L5 nerve root entrapment within the neural foramen and disk herniation was felt to be present early on.  He has had followup study preoperatively and  was found to have resorption of disk rupture at the L4-5 level with findings of persistent spondylolysis at L5-S1 with persistent pain, discomfort, and radiation into his left leg, consistent with irritation of L5 root secondary to pars defects and degenerative disk disease at the adjacent segment at L4-5.  The patient was brought to the operating room to undergo decompression L5-S1 with TLIF left L5-S1 and left L4-5.  FINDINGS:  As above.  DESCRIPTION OF PROCEDURE:  The patient was seen in the preoperative holding area.  He has signed informed consent.  All questions were answered in regard to the surgery.  He received standard preoperative antibiotics Ancef.  He had marking of the left lumbar level L4-5, L5-S1 for continued identification of the site throughout the case. Transported  via stretcher to the OR, OR room #4 at [facility] used for the procedure.  He underwent induction of general anesthesia on the stretcher, intubation was atraumatic, then transferred to the OR table and Jackson spine frame was used.  All pressure points were well padded.  PAS hose bilaterally to prevent DVT.  Arms at 90-90, away from the site. Standard prep with DuraPrep solution from the lower dorsal spine to the mid sacral segment in the midline following clipping of hair over the lower end of the lumbosacral junction.  He was draped in the usual manner, iodine dye drape was used.  Incision initially extending from proximal to the upper aspect of L4-S2 in the midline through the skin and subcutaneous layers after infiltration of Marcaine 0.5% with 1:200,000 epinephrine.  Note that standard time-out protocol was carried out prior to the incision, identifying the patient, disk levels to be performed and estimated blood loss and the length of time.  Following this, then incision was carried through skin and subcu layers down to the spinous processes of L3, L4, L5, S1, and S2, then carried down along the lateral aspect of the spinous process at L4, L5, S1, and S2, down to the posterior elements. Clamps placed over the interspinous process space at the L5-S1, L4-5 level.  Intraoperative C-arm fluoro was brought in to the field sterilely and lateral view demonstrated the clamps at the aforementioned levels, L4-5 and L5-S1.  These areas were marked for continued identification.  Continued dissection was carried out posterior and posterolateral, preserving the facet capsules at L3-4 level, resecting the capsules at the L4-5 and L5-S1 levels and then continuing it outward, identifying the transverse process of L4-L5, and the sacral ala bilaterally and exposing these areas, packing appropriately, bleeding controlled using electrocautery.  The interspinous process was resected between L4-L5  and L5-S1 using Leksell rongeur.  The inferior lamina of L5 was carefully resected bilaterally to the upper insertion site of the ligamentum flavum over the ventral surface inferiorly, then carefully freed up using 3-mm Kerrisons, resecting the ligamentum flavum and insertion into the superior aspect of the lamina of L5 bilaterally.  Grasping the spinous process with a Leksell, then the neural arch was unable to be resected over about 75% distraction and slight rotation.  Remaining residual neural arch on the right side was carefully resected using 3-4 mm Kerrisons, it was quite loose.  Residual ligamentum flavum at L5-S1 level was then resected bilaterally and a foraminotomy performed over both S1 nerve roots.  Lateral recess of L5-S1 was then carefully decompressed using 3-mm Kerrisons.  Osteotome was used to resect the superior jugular process of S1 bilaterally, decompressing the L5 nerve roots bilaterally within the neural foramen.  Leksell  rongeur was used to resect a small portion of bone with the  inferior aspect of the lamina of L4 on the left side.  A 3-mm Kerrison was used to extend this superiorly.  Osteotome then used to make a pars interarticularis cut and the resection of the inferior articular process at L4 on the left side. The ligamentum flavum at the L4-5 levels then resected to its attachment of the ventral surface of L4 on the right side and then the lateral recess on the right side was well decompressed using three 4-mm Kerrisons.  Hockey stick neural probe passed over the L5 root out the neural foramen of L5 and bone and pars, scar material, hypertrophic changes were carefully resected over the L5 root of the neural foramen on the right side.  On the left side, lateral recess at L4-5 was decompressed using three 4-mm Kerrisons.  Superior to the process of L5 was resected using osteotome at the superior aspect of pedicle with L5. This was then resected and then  decompression was carried out over the 5 root to the L5 neural foramen on the left side.  Great deal of hypertrophic changes involving the pars on the left found to be present with impression on the left-sided thecal sac just above the L4-5 nerve root as entry to the L5 neural foramen.  The L5 nerve root was decompressed laterally out to beyond the expected level.  Hockey stick neural probe was then used to passed out the neural foramen bilateral L5- S1 and left to the L4.  The patient had very large veins, the epidural veins, and these were each individually carefully exposed, cauterized at the L4-5, L5-S1 levels.  Using a D'Errico retractor, the posterior aspect of the left thecal sac on the left side at L4-5 and L5 root were carefully identified and retracted medially.  A 15-blade scalpel was used to incise the posterior disk on the left side.  Pituitary was used to debride the degenerative disk materials.  Dilation of the disk was carried out through the #10 mm.  Larger pituitaries were used to further debride the degenerative disk from the disk space and curettes used to curette the endplate cartilaginous material as well as remaining disk material, found to be bleeding bone surfaces.  Bone was morselized, was removed locally, as well as neural arch of L5. Further debridement was carried out at the disk space until it was felt to be adequately prepared for fusion and then dilation that was carried out using the trials to a #10 trial which provided the best fit on the left side.  This was angled and convergence, so that it placed the cage at about the midline.  The patient had the C-arm brought back into the field and under the C-arm, then #10 trial in place provided excellent fit at the L4-5 levels, so a lordotic 10-mm cage was chosen, 27-mm length was appropriate.  The bone graft locally obtained was placed within the cage.  Additional bone graft was packed into  the intervertebral disk space, packing and impacting the graft into place at least 3 times.  The 10-mm cage then directed into place into the intervertebral disk space at L4 and packed it at an angle of convergence across the midline about 15 degrees.  Subset beneath the posterior aspect of disk space by about 3 or 4 mm, observed on C-arm fluoro, being in excellent position alignment.  Bleeders controlled using bipolar electrocautery, thrombin-soaked Gelfoam.  Attention was then turned to  the L5-S1 level where again this was incised using to recover retractor, protectin the thecal sac and S1 nerve root.  A 15-blade scalpel was used to incise the disk on the left side and the disk space.  Debrided the degenerative disk material using pituitary rongeurs at first and then curettage carried out after dilation of the disk space up to another 10- mm dilator.  This completed and then the patient had disk space debrided of endplate cartilage material using curettage and straight curettes, upbiting right, upbiting left curettes down to bleeding bone surfaces. Larger pituitaries were used to further debride the disk space of degenerative disk material and cartilaginous endplate.  This was completed, then bone graft was placed in the intervertebral disk space after first sounding the disk space using the trial implants, #11 trial implant provided the best fit, and this was carefully packed with bone graft, a permanent implant packed with additional local bone graft. Intervertebral disk space was packed with bone graft and impacted with an 8-mm and 9-mm dilator at least 3 times, filling the disk space appropriately.  The cage then filled with bone graft and this was inserted to the appropriate degree of convergence, impacted into place, obtaining excellent convergence, and able to subset this approximately 4 mm beneath the posterior aspect of the L5-S1 disk space edge.  This completed TLIF on the  left side, bleeders controlled using bipolar electrocautery, thrombin-soaked Gelfoam packed with cottonoids.  Thecal sac carefully protected with additional cottonoids.  Attention was then turned to internal fixation on the left side.  Intersection of the transverse process of L4 though lateral aspect of the lamina 4 was identified and awl used to make initial entry point, observed on C-arm fluoro to be in good position alignment on lateral and then handheld pedicle probe was used to probe pedicle channel into the vertebral body, observed to be good position alignment on the lateral.  An Aspiration equipment was then used to perform aspiration at the left L4 vertebral body.  Insertion of the T-handheld aspiration equipment was then carefully placed.  This was a Synthes aspiration handle.  A 10 mL of bone marrow was then aspirated from the vertebral body of C4 and this was used to charge 10 mL master graft.  Decortication was carried out in the transverse process of L4 on the left side, tapping of the opening into the pedicles performed using a 5-mm tap and carefully examining the opening into the pedicle using a ball-tipped pedicle probe.  These channels felt to be patent without broaching of the cortex, 40 mm x 6 mm Expedium screw was then inserted into place after first placing local bone graft over the left transverse process of L4, that had been decorticated previously.  Additional master graft was placed as well as strips.  After this screw was placed and the awl was placed at the L5 level, intraoperative lateral radiograph demonstrated the screw 4 to be in good position alignment.  The awl nicely positioned for entry point for the L5 pedicle screw.  This was at the intersection of the transverse process of 5 lateral aspect of pedicle 5.  Handheld pedicle probe was then used to probe pedicle channel 5 using correct degree of lordosis and convergence.  This was probed to 40 mm,  observed on C-arm fluoro to be in good position alignment on lateral.  The pedicle probe was then removed.  Ball-tipped probe was used to probe the channel, demonstrating its patency without sign of broaching  cortex.  Tapping was then performed using a 6-mm tap and a 7 mm x 40 mm screw was used at this level, decorticating transverse process of 5 lateral aspect, superior to process of 5.  Bone graft was then applied in posterolateral including local bone graft and master graft.  A 40 mm x 7 mm screw was then inserted to correct degree of convergence and lordosis, obtaining excellent fixation here.  Final screw on the left side was placed at the S1 level at the very lowest down in to the superior articular process of S1, near the dimple in the posterior cortex here.  Awl was used to make an entry point, observed on C-arm fluoro to be good position alignment and handheld pedicle probe was used to probe the pedicle channel on the left side, probed to 35 mm.  Following tapping of cortex, provided some fixation and anterior cortex performed.  A 35 mm x 7 mm screw was chosen.  Decortication of the sacral ala carried out using local bone graft and the master graft material from the L5 transverse process to the S1 transverse process on the left side.  A 7-mm screw was then inserted, obtaining the excellent fixation, this was done without tapping.  Next, a 65-mm rod was then placed within the fasteners, extending from L4 to sacrum on the left side.  This provided excellent length and the caps were able to be placed on this side without any further manipulation of the rod contouring.  Attention was then turned to the right side where similarly pedicle screws were placed at L4, L5, and S1, these were all done in a similar fashion, in which case an awl was first used to make an entry point, observing on C-arm fluoro, correct position of this entry point, and then using a pedicle probe, probing the  pedicle channel at each segment after the awl was used. Ball-tipped probe was used to probe the pedicle channels after first initiation with the pedicle probe, also after tapping with a tap that was 1 mm.  Following that, chosen for the level, 5 mm was used at the L4 level on the right side and a four 6-mm screw was placed about 40 mm on the right at L4 using convergence and lordosis to improve fixation and alignment.  Decortication was carried out in the transverse process of L4 on the right side and master graft was placed posterior laterally at this level on the right side.  The second screw at 5 was similarly placed as above, again checking with ball-tipped probe whenever the opening was made, ensuring no broaching of the cortex.  Tapping with a 6- mm tap, then placing a 7 mm x 40 mm screw on the right side with correct degree of convergence and lordosis.  Decortication transverse process of bone graft applied out posterolaterally.  Additional local bone graft was available and was placed posteriorly at 5, extended towards sacrum, upward as well. Master graft was also used.  Finally, at the S1 level, last screw was placed on the right side at the S1 level using an awl for an entry point and pedicle probe, probing pedicle channel, observed on a C-arm fluoro to be in good position alignment, depth of 35 mm.  Carefully, tapping to broach this anterior cortex of the sacrum, then placing a 35 mm x 7 mm screw, here obtaining excellent purchase.  The rod chosen on the right side was a 75-mm rod, it had to be contoured to  the lordosis here as screw 5 appeared to be greater depth on the left side.  After carefully contouring, this was easily fit within the fasteners and the right-sided caps placed.  The patient then had left-sided pedicle fastener at L4, tightened 80-foot pounds, the cap was tightened to the fasteners, obtaining purchase on the rod.  Compression between the fastener of L4 and  L5 was then obtained and the fastener cap at the 5 level then tightened 80 foot-pounds.  An compression then obtained between the fastener of L5 and S1 on the left side and the cap at the S1 level was then tightened to 80-foot pounds and rod was through the fasteners to allow for excellent fixation here.  Similarly, this was then carried on the right side, first connecting the rod to the fastener at the L4 level by tightening the cap to 80-foot pounds at this level, then compression between the L4 and L5 fasteners on the right side while the cap at L5 was tightened to 80-foot pounds.  Compression then between L5 and S1 on the right side was obtained and fastener cap at S1 tightened to 80-foot pounds and this completed the rods screw placement.  Measurement for an inner rod connector was then carried and an A6 connector was chosen. This was then carefully passed over the rods at the L5-S1 level, obtaining good purchase on the rods.  These were then tightened down to the expected torque using the torque wrench provided anti-torque device. The central screw, adjustment screw for the connector centrally placed then on the facet and tightened for the expected torque using torque wrench provided anti-torque sleeve.  This completed fixation.  Permanent images were obtained in AP and lateral planes, also lordotic view, examining screw placement at each level.  Screws appeared to be in excellent position alignment, did not require any revision of any segment.  This irrigation was carried out.  Gelfoam was removed at each level from the left side and right side, careful further hemostasis obtained, decompressing the nerve roots within the neural foramen, ensuring there was no residual bone graft material within the spinal canal or within the neural foramen at both the L4-5, L5-S1 level.  Veins were cauterized using bipolar electrocautery, controlled the bleeding here and also on the right side at  L5-S1.  Gelfoam layer was then placed also beneath the transverse rod at the L5-S1 level where the connector appeared to be pressing on the thecal sac.  A Gelfoam was placed between this and the thecal sac.  Irrigation was carried out.  Excess devitalized tissue found to be present on the edges of the incision were carefully debrided using electrocautery.  Viper retractor was removed. Bleeders controlled using electrocautery.  Medium Hemovac drain placed to the depth incision over the right side.  The lumbosacral muscles were reapproximated in the midline loosely using #1 Vicryl sutures at the L5- S1 level.  The lumbodorsal fascia reapproximated the spinous process, interspinous ligament to the S1-S2 using #1 Vicryl sutures.  At the L3 and L4 levels using #1 Vicryl sutures and then to itself using #1 Vicryl sutures in simple fashion.  Deep subcu layers approximated with interrupted #1 and #0 Vicryl sutures, more superficial layers with interrupted 2-0 Vicryl sutures.  Skin was closed with running subcu stitch of 4-0 Vicryl.  Dermabond was applied and Mepilex bandage.  All instrument and sponge counts were correct.  The patient was then returned to a supine position and reactivated, extubated, and returned to recovery  room in satisfactory condition.  All instrument and sponge counts were correct.  Physician assistant's responsibility, Maud Deed, performed the duties of assistant surgeon during this case.  She performed careful retraction of neural structures at both the L4-5 and L5-S1 level and careful suctioning using the Cell Saver throughout the case.  She assisted in the application of rods and screws during the fixation portion.  She assisted in careful placement of screws and the transverse loading rod.  She performed closure of the incision from the fascia layer to the skin, application of dressing.  She was present from beginning of the case, assisted in positioning and was  present at the end of the case with mobilization from operating table.  Complications were none.     Kerrin Champagne, M.D.     JEN/MEDQ  D:  01/30/2011  T:  01/31/2011  Job:  [id number]  Electronically Signed by Vira Browns M.D. on 01/31/2011 10:20:24 PM

## 2011-03-10 NOTE — Discharge Summary (Signed)
NAMESREEKAR, BROYHILL NO.:  [id number]  MEDICAL RECORD NO.:  [id number]  LOCATION:  5004                         FACILITY:  [facility]  PHYSICIAN:  Kerrin Champagne, M.D.   DATE OF BIRTH:  [birth year]  DATE OF ADMISSION:  01/30/2011 DATE OF DISCHARGE:  02/03/2011                              DISCHARGE SUMMARY   ADMISSION DIAGNOSES: 1. Bilateral spondylolysis at L5-S1and left L5 radiculopathy. 2. Lumbar degenerative disk disease L4-5 with recurrent herniated     nucleus pulposus. 3. Gastroesophageal reflux disease. 4. Hypertension. 5. Status post cervical fusion. 6. History of hyperlipidemia. 7. History of hyperglycemia. 8. Nonobstructive coronary artery disease.  DISCHARGE DIAGNOSES: 1. Bilateral spondylolysis at L5-S1 and left L5 radiculopathy. 2. Lumbar degenerative disk disease L4-5 with recent herniated nucleus     pulposus. 3. Left L5 nerve root entrapment secondary to scar and hypertrophic     changes about the left pars interarticularis defect. 4. Loose neural arch at the L5 level with hypertrophic changes     bilaterally. 5. Degenerative disk disease at L4-5. 6. Gastroesophageal reflux disease. 7. Hypertension. 8. Status post cervical fusion. 9. History of hyperlipidemia. 10.History of hyperglycemia. 11.Nonobstructive coronary artery disease  PROCEDURES:  The patient underwent Gill procedure at the L5 level with resection of the central portions of the neural arch and bilateral inferior articular processes of L5.  Decompression of bilateral L5 nerve roots and left L4 nerve root.  Left L4-5 transforaminal lumbar interbody fusion.  Left L5-S1 transforaminal lumbar interbody fusion and posterolateral fusion L4-S1 using combination of local bone graft and master graft with bone graft aspiration.  Fixation of L4-S1 with pedicle screw and rod instrumentation.  This was performed by Dr. Otelia Sergeant, assisted by Maud Deed, PA-C under general anesthesia on  January 30, 2011.  BRIEF HISTORY:  Patient is a 60 year old male followed for several years with progressive back pain and radiation into the left lower extremity. He has undergone attempts at conservative management including physical therapy, epidural steroid injections and analgesics.  Studies have shown bilateral spondylolysis at L5-S1 and left-sided L5 nerve root entrapment within the neural foramen and disk herniation.  Further studies found resorption of the disk rupture at L4-5 with findings of persistent spondylolysis at L5-S1.  He continue to have persistent pain with radiation of pain in the left leg consistent with L5 nerve root irritation secondary to the pars defect and degenerative disk disease at the adjacent segment at L4-5.  It was felt he would require surgical intervention and was admitted for the procedure as stated above.  BRIEF HOSPITAL COURSE:  The patient tolerated the procedure under general anesthesia.  Postoperatively, neurovascular and motor function of the lower extremities were noted to be intact.  The patient was treated with PCA analgesics and initially was weaned to p.o. analgesics without difficulty.  [name] diet was held until his bowel function returned and then he was advanced to a regular diet.  The patient was able to void after his Foley catheter was discontinued.  He was started on physical therapy for ambulation and gait training and was treated with an Aspen LSO.  The patient continued to have some left lower extremity pain and  numbness.  He was started on a steroid Dosepak which he took throughout the hospital stay.  He was able to advance well with physical therapy and was able to ambulate in the hallway.  He was able to don and doff his brace independently.  The patient was eventually stable for discharge to his home for continuation of his recovery.  Final pertinent laboratory values at discharge, hemoglobin 12.6, hematocrit 36.2.   Chemistry studies on August 7 were within normal limits with exception of glucose 150.  PLAN:  The patient was discharged to his home.  Instructions given for daily dressing changes.  Ambulation as tolerated.  He is not allowed to drive while using narcotic analgesics.  He will follow up with Dr. Otelia Sergeant in 2 weeks.  He will wear his brace full-time and avoid bending, lifting or twisting.  MEDICATIONS AT DISCHARGE:  Include 1. Gabapentin 100 mg p.o. t.i.d. 2. Methocarbamol 500 mg q.8 h. p.r.n. for spasm. 3. OxyIR 5 mg 5-15 mg q. 3-4 hours as needed for pain. 4. Prednisone Dosepak 10 mg taper dose.  He will continue his home medications including losartan, metoprolol and Protonix and simvastatin.  The patient is advised to call the office should he have questions or concerns prior to his return office visit.  CONDITION ON DISCHARGE:  Stable.     Wende Neighbors, P.A.   ______________________________ Kerrin Champagne, M.D.    SMV/MEDQ  D:  03/09/2011  T:  03/09/2011  Job:  [id number]  Electronically Signed by Dorna Mai. on 03/10/2011 03:37:48 PM Electronically Signed by Vira Browns M.D. on 03/10/2011 04:53:05 PM

## 2011-03-15 ENCOUNTER — Other Ambulatory Visit: Payer: Self-pay | Admitting: Specialist

## 2011-03-15 DIAGNOSIS — M792 Neuralgia and neuritis, unspecified: Secondary | ICD-10-CM

## 2011-03-17 ENCOUNTER — Ambulatory Visit
Admission: RE | Admit: 2011-03-17 | Discharge: 2011-03-17 | Disposition: A | Discharge status: Home / Self Care | Payer: 59 | Source: Ambulatory Visit | Attending: Specialist | Admitting: Specialist

## 2011-03-17 DIAGNOSIS — M792 Neuralgia and neuritis, unspecified: Secondary | ICD-10-CM

## 2011-03-23 ENCOUNTER — Telehealth: Payer: Self-pay | Admitting: *Deleted

## 2011-03-23 NOTE — Telephone Encounter (Signed)
Immunization report from [facility]  Influenza Manu: [employer] Lot #: [id number] Exp Date: 12/24/2011 Injection Site: Left Arm Date Given: 03/16/2011

## 2011-04-21 ENCOUNTER — Other Ambulatory Visit: Payer: Self-pay | Admitting: Cardiovascular Disease

## 2011-04-24 ENCOUNTER — Other Ambulatory Visit: Payer: Self-pay | Admitting: Cardiovascular Disease

## 2011-07-06 ENCOUNTER — Telehealth: Payer: Self-pay | Admitting: Cardiovascular Disease

## 2011-07-06 NOTE — Telephone Encounter (Signed)
New PRoblem:     Patient called in demanding a refill of two of his medications that ran out that he could not remember the name of and did not have the medication bottles readily available.  Patient claims that Dr. Glennon Hamilton placed him on those medications and that we would know and have them on file. Later stated that one of them started with a met- and the other started with a lor-.

## 2011-07-07 ENCOUNTER — Other Ambulatory Visit: Payer: Self-pay | Admitting: *Deleted

## 2011-07-07 MED ORDER — METOPROLOL SUCCINATE ER 25 MG PO TB24: 25.0000 mg | ORAL_TABLET | Freq: Every day | ORAL | Status: DC

## 2011-07-07 MED ORDER — LOSARTAN POTASSIUM-HCTZ 100-12.5 MG PO TABS: 1.0000 | ORAL_TABLET | Freq: Every day | ORAL | Status: DC

## 2011-07-26 ENCOUNTER — Encounter: Payer: Self-pay | Admitting: Endocrinology

## 2011-07-26 ENCOUNTER — Other Ambulatory Visit (INDEPENDENT_AMBULATORY_CARE_PROVIDER_SITE_OTHER): Payer: 59

## 2011-07-26 ENCOUNTER — Ambulatory Visit (INDEPENDENT_AMBULATORY_CARE_PROVIDER_SITE_OTHER): Payer: 59 | Admitting: Endocrinology

## 2011-07-26 DIAGNOSIS — I10 Essential (primary) hypertension: Secondary | ICD-10-CM

## 2011-07-26 DIAGNOSIS — Z79899 Other long term (current) drug therapy: Secondary | ICD-10-CM

## 2011-07-26 DIAGNOSIS — R7309 Other abnormal glucose: Secondary | ICD-10-CM

## 2011-07-26 DIAGNOSIS — E785 Hyperlipidemia, unspecified: Secondary | ICD-10-CM

## 2011-07-26 DIAGNOSIS — Z125 Encounter for screening for malignant neoplasm of prostate: Secondary | ICD-10-CM

## 2011-07-26 LAB — CBC WITH DIFFERENTIAL/PLATELET
Basophils Absolute: 0 10*3/uL (ref 0.0–0.1)
Eosinophils Absolute: 0.2 10*3/uL (ref 0.0–0.7)
HCT: 43 % (ref 39.0–52.0)
Lymphs Abs: 1.2 10*3/uL (ref 0.7–4.0)
MCV: 95.8 fl (ref 78.0–100.0)
Monocytes Absolute: 0.7 10*3/uL (ref 0.1–1.0)
Platelets: 164 10*3/uL (ref 150.0–400.0)
RDW: 12.8 % (ref 11.5–14.6)

## 2011-07-26 LAB — URINALYSIS, ROUTINE W REFLEX MICROSCOPIC
Bilirubin Urine: NEGATIVE
Leukocytes, UA: NEGATIVE
Nitrite: NEGATIVE
Specific Gravity, Urine: 1.025 (ref 1.000–1.030)
pH: 6.5 (ref 5.0–8.0)

## 2011-07-26 LAB — TSH: TSH: 0.63 u[IU]/mL (ref 0.35–5.50)

## 2011-07-26 LAB — HEPATIC FUNCTION PANEL
ALT: 25 U/L (ref 0–53)
Total Bilirubin: 0.8 mg/dL (ref 0.3–1.2)

## 2011-07-26 LAB — BASIC METABOLIC PANEL
CO2: 26 mEq/L (ref 19–32)
Calcium: 8.8 mg/dL (ref 8.4–10.5)
GFR: 113.17 mL/min (ref >60.00)
Sodium: 141 mEq/L (ref 135–145)

## 2011-07-26 LAB — PSA: PSA: 1.15 ng/mL (ref 0.10–4.00)

## 2011-07-26 LAB — LIPID PANEL
HDL: 37.2 mg/dL — ABNORMAL LOW (ref >39.00)
Total CHOL/HDL Ratio: 4

## 2011-07-26 MED ORDER — CEFUROXIME AXETIL 250 MG PO TABS: 250.0000 mg | ORAL_TABLET | Freq: Two times a day (BID) | ORAL | Status: DC

## 2011-07-26 NOTE — Progress Notes (Signed)
Subjective:    Patient ID: Jeff Lopez, male    DOB: Aug 22, 1950, 61 y.o.   MRN: [id number]  HPI Pt states few days of moderate pain at the throat, and assoc nasal congestion.   He has missed the last few days of bp meds. Past Medical History  Diagnosis Date  . HYPERLIPIDEMIA 04/09/2007  . HYPERTENSION, BENIGN 04/19/2010  . CAD, NATIVE VESSEL 04/19/2010    nonobstructive by cath 2007:  oLAD 20-30%, mLAD 50%, pCFX 20-30%, oAVCFX 20-30%, L renal art 50%;  normal LVF  . GERD 04/09/2007  . RENAL CYST 05/27/2010  . OSTEOARTHRITIS, LUMBAR SPINE 04/09/2007  . LUMBAR DISC DISORDER 05/27/2010  . HYPERGLYCEMIA 04/09/2007    Past Surgical History  Procedure Date  . Spine surgery 2006    C-spine surgery x 2  . Back surgery   . Neck surgery     History   Social History  . Marital Status: Single    Spouse Name: N/A    Number of Children: N/A  . Years of Education: N/A   Occupational History  . Not on file.   Social History Main Topics  . Smoking status: Former Games developer  . Smokeless tobacco: Never Used  . Alcohol Use: No  . Drug Use: No  . Sexually Active: Not on file   Other Topics Concern  . Not on file   Social History Narrative   Works in Set designer.    Current Outpatient Prescriptions on File Prior to Visit  Medication Sig Dispense Refill  . losartan-hydrochlorothiazide (HYZAAR) 100-12.5 MG per tablet Take 1 tablet by mouth daily.  30 tablet  6  . metoprolol succinate (TOPROL-XL) 25 MG 24 hr tablet Take 1 tablet (25 mg total) by mouth daily.  30 tablet  6  . pantoprazole (PROTONIX) 40 MG tablet TAKE 1 TABLET BY MOUTH ONCE DAILY  30 tablet  9  . simvastatin (ZOCOR) 20 MG tablet Take 20 mg by mouth at bedtime.          No Known Allergies  Family History  Problem Relation Age of Onset  . Cancer Mother     Breast Cancer, Uterine Cancer  . Breast cancer Mother   . Uterine cancer Mother   . Hypertension Brother     BP 162/108  Pulse 76  Temp(Src) 98.4 F  (36.9 C) (Oral)  Ht 5\' 11"  (1.803 m)  Wt 192 lb 12.8 oz (87.454 kg)  BMI 26.89 kg/m2  SpO2 96%   Review of Systems No cough.  Uncertain if he has any fever.      Objective:   Physical Exam VITAL SIGNS:  See vs page GENERAL: no distress head: no deformity eyes: no periorbital swelling, no proptosis external nose and ears are normal mouth: no lesion seen, but pharynx is red Both tm's are slightly red LUNGS:  Clear to auscultation  Lab Results  Component Value Date   WBC 9.0 07/26/2011   HGB 15.0 07/26/2011   HCT 43.0 07/26/2011   PLT 164.0 07/26/2011   GLUCOSE 115* 07/26/2011   CHOL 161 07/26/2011   TRIG 141.0 07/26/2011   HDL 37.20* 07/26/2011   LDLCALC 96 07/26/2011   ALT 25 07/26/2011   AST 21 07/26/2011   NA 141 07/26/2011   K 3.5 07/26/2011   CL 106 07/26/2011   CREATININE 0.8 07/26/2011   BUN 12 07/26/2011   CO2 26 07/26/2011   TSH 0.63 07/26/2011   PSA 1.15 07/26/2011   INR 0.86 01/25/2011   HGBA1C  6.5 07/26/2011      Assessment & Plan:  Glenford Peers, new. Htn, therapy limited by noncompliance.  i'll do the best i can. Dyslipidemia, well-controlled

## 2011-07-26 NOTE — Patient Instructions (Addendum)
You should resume the blood pressure medication. i have sent a prescription to your pharmacy, for an antibiotic. Loratadine-d (non-prescription) will help your congestion.  I hope you feel better soon.  If you don't feel better by next week, please call back.   blood tests are being requested for you today.  please call [phone] to hear your test results.  You will be prompted to enter the 9-digit "MRN" number that appears at the top left of this page, followed by #.  Then you will hear the message.

## 2011-07-29 ENCOUNTER — Emergency Department (HOSPITAL_COMMUNITY)
Admission: EM | Admit: 2011-07-29 | Discharge: 2011-07-29 | Disposition: A | Discharge status: Home / Self Care | Payer: 59 | Attending: Emergency Medicine | Admitting: Emergency Medicine

## 2011-07-29 ENCOUNTER — Encounter (HOSPITAL_COMMUNITY): Payer: Self-pay | Admitting: *Deleted

## 2011-07-29 DIAGNOSIS — Z79899 Other long term (current) drug therapy: Secondary | ICD-10-CM | POA: Insufficient documentation

## 2011-07-29 DIAGNOSIS — I10 Essential (primary) hypertension: Secondary | ICD-10-CM | POA: Insufficient documentation

## 2011-07-29 DIAGNOSIS — E785 Hyperlipidemia, unspecified: Secondary | ICD-10-CM | POA: Insufficient documentation

## 2011-07-29 DIAGNOSIS — K121 Other forms of stomatitis: Secondary | ICD-10-CM | POA: Insufficient documentation

## 2011-07-29 DIAGNOSIS — I2510 Atherosclerotic heart disease of native coronary artery without angina pectoris: Secondary | ICD-10-CM | POA: Insufficient documentation

## 2011-07-29 MED ORDER — LIDOCAINE VISCOUS 2 % MT SOLN: 20.0000 mL | OROMUCOSAL | Status: DC | PRN

## 2011-07-29 MED ORDER — FIRST-DUKES MOUTHWASH MT SUSP: 15.0000 mL | OROMUCOSAL | Status: DC | PRN

## 2011-07-29 NOTE — ED Notes (Signed)
Pt from home c/o mouth lesions inside mouth, on tongue as well as sore throat with painful swallowing. Pt's mouth begin burning yesterday and then noticed lesions that are white in color. Pt was given oral antibiotics last Monday for cold symptoms.

## 2011-07-29 NOTE — ED Notes (Signed)
PA at bedside.

## 2011-07-29 NOTE — ED Provider Notes (Signed)
History     CSN: [id number]  Arrival date & time 07/29/11  1428   First MD Initiated Contact with Patient 07/29/11 1500      Chief Complaint  Patient presents with  . Mouth Lesions    (Consider location/radiation/quality/duration/timing/severity/associated sxs/prior treatment) Patient is a 61 y.o. male presenting with mouth sores. The history is provided by the patient.  Mouth Lesions  The current episode started yesterday. The onset was sudden. The problem has been unchanged. The problem is moderate. Associated symptoms include mouth sores and sore throat. Pertinent negatives include no fever, no congestion, no ear pain and no neck pain.  Pt states he was sick with the flu for the last week. States was seen by his PCP was started on ceftin. State all his symptoms resolved. Yesterday noticed that his lips mouth and tongue began to burn. Today noticed several white sores on his lower lip. Denies fever, chills, swelling. States foods making his mouth burn. No other complaints. Did not try any medications for this.   Past Medical History  Diagnosis Date  . HYPERLIPIDEMIA 04/09/2007  . HYPERTENSION, BENIGN 04/19/2010  . CAD, NATIVE VESSEL 04/19/2010    nonobstructive by cath 2007:  oLAD 20-30%, mLAD 50%, pCFX 20-30%, oAVCFX 20-30%, L renal art 50%;  normal LVF  . GERD 04/09/2007  . RENAL CYST 05/27/2010  . OSTEOARTHRITIS, LUMBAR SPINE 04/09/2007  . LUMBAR DISC DISORDER 05/27/2010  . HYPERGLYCEMIA 04/09/2007    Past Surgical History  Procedure Date  . Spine surgery 2006    C-spine surgery x 2  . Back surgery   . Neck surgery     Family History  Problem Relation Age of Onset  . Cancer Mother     Breast Cancer, Uterine Cancer  . Breast cancer Mother   . Uterine cancer Mother   . Hypertension Brother     History  Substance Use Topics  . Smoking status: Former Games developer  . Smokeless tobacco: Never Used  . Alcohol Use: No      Review of Systems  Constitutional: Negative  for fever and chills.  HENT: Positive for sore throat and mouth sores. Negative for ear pain, congestion, facial swelling, trouble swallowing, neck pain and dental problem.   Eyes: Negative.   Respiratory: Negative.   Cardiovascular: Negative.   Gastrointestinal: Negative.   Genitourinary: Negative.   Musculoskeletal: Negative.   Skin: Negative.   Neurological: Negative.   Psychiatric/Behavioral: Negative.     Allergies  Review of patient's allergies indicates no known allergies.  Home Medications   Current Outpatient Rx  Name Route Sig Dispense Refill  . CEFUROXIME AXETIL 250 MG PO TABS Oral Take 1 tablet (250 mg total) by mouth 2 (two) times daily. 14 tablet 0  . LOSARTAN POTASSIUM-HCTZ 100-12.5 MG PO TABS Oral Take 1 tablet by mouth daily. 30 tablet 6  . METOPROLOL SUCCINATE ER 25 MG PO TB24 Oral Take 1 tablet (25 mg total) by mouth daily. 30 tablet 6  . PANTOPRAZOLE SODIUM 40 MG PO TBEC  TAKE 1 TABLET BY MOUTH ONCE DAILY 30 tablet 9  . SIMVASTATIN 20 MG PO TABS Oral Take 20 mg by mouth at bedtime.        BP 152/91  Pulse 99  Temp(Src) 99.1 F (37.3 C) (Oral)  Resp 18  Ht 5\' 11"  (1.803 m)  Wt 192 lb (87.091 kg)  BMI 26.78 kg/m2  SpO2 94%  Physical Exam  Nursing note and vitals reviewed. Constitutional: He is oriented to person,  place, and time. He appears well-developed and well-nourished. No distress.  HENT:  Head: Normocephalic and atraumatic.  Right Ear: External ear normal.  Left Ear: External ear normal.  Nose: Nose normal.  Mouth/Throat: Oropharynx is clear and moist.       Multiple small, about 2-61mm white ulcers, to the oral mucosa of the lower lip and gums. Tongue erythemous. No swelling, no thrush.  Eyes: Conjunctivae are normal.  Neck: Neck supple.  Cardiovascular: Normal rate, regular rhythm and normal heart sounds.   Pulmonary/Chest: Effort normal and breath sounds normal. No respiratory distress.  Lymphadenopathy:    He has no cervical adenopathy.    Neurological: He is alert and oriented to person, place, and time.  Skin: Skin is warm and dry.    ED Course  Procedures (including critical care time)  Exam consistent with aphthous ulcers. Will treat. No fever, no other rash anywhere on the body. Pt non toxic.  No diagnosis found.    MDM          Lottie Mussel, PA 07/30/11 [phone]

## 2011-07-30 NOTE — ED Provider Notes (Signed)
Medical screening examination/treatment/procedure(s) were performed by non-physician practitioner and as supervising physician I was immediately available for consultation/collaboration.  Raeford Razor, MD 07/30/11 [phone]

## 2011-07-31 ENCOUNTER — Ambulatory Visit (INDEPENDENT_AMBULATORY_CARE_PROVIDER_SITE_OTHER): Payer: 59 | Admitting: Endocrinology

## 2011-07-31 ENCOUNTER — Encounter: Payer: Self-pay | Admitting: Endocrinology

## 2011-07-31 DIAGNOSIS — K121 Other forms of stomatitis: Secondary | ICD-10-CM

## 2011-07-31 DIAGNOSIS — K1370 Unspecified lesions of oral mucosa: Secondary | ICD-10-CM

## 2011-07-31 MED ORDER — VALACYCLOVIR HCL 1 G PO TABS: 1000.0000 mg | ORAL_TABLET | Freq: Two times a day (BID) | ORAL | Status: DC

## 2011-07-31 MED ORDER — LEVOFLOXACIN 500 MG PO TABS: 500.0000 mg | ORAL_TABLET | Freq: Every day | ORAL | Status: DC

## 2011-07-31 NOTE — Patient Instructions (Signed)
i have sent 2 prescriptions to your pharmacy (1 for each problem).   I hope you feel better soon.  If you don't feel better by next week, please call back.

## 2011-07-31 NOTE — Progress Notes (Signed)
  Subjective:    Patient ID: Jeff Lopez, male    DOB: 11/11/1950, 61 y.o.   MRN: [id number]  HPI He was seen in er 2 days ago, with mild oral ulcers.  They have present x 4 days.  There is moderate assoc pain. Past Medical History  Diagnosis Date  . HYPERLIPIDEMIA 04/09/2007  . HYPERTENSION, BENIGN 04/19/2010  . CAD, NATIVE VESSEL 04/19/2010    nonobstructive by cath 2007:  oLAD 20-30%, mLAD 50%, pCFX 20-30%, oAVCFX 20-30%, L renal art 50%;  normal LVF  . GERD 04/09/2007  . RENAL CYST 05/27/2010  . OSTEOARTHRITIS, LUMBAR SPINE 04/09/2007  . LUMBAR DISC DISORDER 05/27/2010  . HYPERGLYCEMIA 04/09/2007    Past Surgical History  Procedure Date  . Spine surgery 2006    C-spine surgery x 2  . Back surgery   . Neck surgery     History   Social History  . Marital Status: Single    Spouse Name: N/A    Number of Children: N/A  . Years of Education: N/A   Occupational History  . Not on file.   Social History Main Topics  . Smoking status: Former Games developer  . Smokeless tobacco: Never Used  . Alcohol Use: No  . Drug Use: No  . Sexually Active: Not on file   Other Topics Concern  . Not on file   Social History Narrative   Works in Set designer.    Current Outpatient Prescriptions on File Prior to Visit  Medication Sig Dispense Refill  . Diphenhyd-Hydrocort-Nystatin (FIRST-DUKES MOUTHWASH) SUSP Use as directed 15 mLs in the mouth or throat every 2 (two) hours as needed.  236 mL  0  . gabapentin (NEURONTIN) 100 MG capsule Take 100 mg by mouth 3 (three) times daily.      [name] lidocaine (XYLOCAINE) 2 % solution Take 20 mLs by mouth as needed for pain.  100 mL  0  . metoprolol succinate (TOPROL-XL) 25 MG 24 hr tablet Take 1 tablet (25 mg total) by mouth daily.  30 tablet  6  . Multiple Vitamin (MULITIVITAMIN WITH MINERALS) TABS Take 1 tablet by mouth daily.      . pantoprazole (PROTONIX) 40 MG tablet TAKE 1 TABLET BY MOUTH ONCE DAILY  30 tablet  9  . simvastatin (ZOCOR) 20 MG  tablet Take 20 mg by mouth at bedtime.          Allergies  Allergen Reactions  . Ceftin     Family History  Problem Relation Age of Onset  . Cancer Mother     Breast Cancer, Uterine Cancer  . Breast cancer Mother   . Uterine cancer Mother   . Hypertension Brother     BP 152/74  Pulse 92  Temp(Src) 97.6 F (36.4 C) (Oral)  Ht 5\' 11"  (1.803 m)  Wt 188 lb (85.276 kg)  BMI 26.22 kg/m2  SpO2 95%   Review of Systems Denies fever.  Left ear pain is not improved    Objective:   Physical Exam VITAL SIGNS:  See vs page GENERAL: no distress Left tm is red Mouth:  Several bilateral shallow ulcers on the tongue and inside of lips     Assessment & Plan:  Oral ulcers, persistent Ear pain, persistent HTN, prob situational--we'll follow

## 2011-08-04 ENCOUNTER — Encounter: Payer: Self-pay | Admitting: Gastroenterology

## 2011-08-04 ENCOUNTER — Ambulatory Visit (INDEPENDENT_AMBULATORY_CARE_PROVIDER_SITE_OTHER): Payer: 59 | Admitting: Gastroenterology

## 2011-08-04 VITALS — BP 146/80 | HR 88 | Ht 71.0 in | Wt 190.0 lb

## 2011-08-04 DIAGNOSIS — K625 Hemorrhage of anus and rectum: Secondary | ICD-10-CM

## 2011-08-04 DIAGNOSIS — K219 Gastro-esophageal reflux disease without esophagitis: Secondary | ICD-10-CM

## 2011-08-04 MED ORDER — PEG-KCL-NACL-NASULF-NA ASC-C 100 G PO SOLR: 1.0000 | ORAL | Status: DC

## 2011-08-04 MED ORDER — PANTOPRAZOLE SODIUM 40 MG PO TBEC: 40.0000 mg | DELAYED_RELEASE_TABLET | Freq: Two times a day (BID) | ORAL | Status: DC

## 2011-08-04 NOTE — Patient Instructions (Signed)
New prescription written for protonix twice daily.  Best taken 20-30 min prior to BF and dinner meals. You will be set up for a colonoscopy for new rectal bleeding, minor.

## 2011-08-04 NOTE — Progress Notes (Signed)
Review of pertinent gastrointestinal problems: 1. GERD, intermittent dysphagia that is likely GERD related. EGD February 2010 found 2 cm hiatal hernia, no stricture, slightly irregular Z line that was biopsied and biopsies showed no sign of intestinal metaplasia. 2. Routine risk for colon cancer: colonoscopy with SML in 03/2004 found 7mm HP polyp.  HPI: This is a  very pleasant 61 year old man whom I last saw about 3 years ago.  He has been pretty good.  His company is closing and then he will be laid off soon.    Has seen some minor rectal bleeding at times,  Pretty frequent. "scares him at times."  NO anal pains.  Bowels are pretty regular  Takes protonix 40mg  every day, sometimes he has to double it.  Takes it first thing in AM, then eats usually 30 min afterwards.  Sometimes he doesn't eat BM.  Weight stable.  No vomiting blood.  No dysphagia.   Past Medical History  Diagnosis Date  . HYPERLIPIDEMIA 04/09/2007  . HYPERTENSION, BENIGN 04/19/2010  . CAD, NATIVE VESSEL 04/19/2010    nonobstructive by cath 2007:  oLAD 20-30%, mLAD 50%, pCFX 20-30%, oAVCFX 20-30%, L renal art 50%;  normal LVF  . GERD 04/09/2007  . RENAL CYST 05/27/2010  . OSTEOARTHRITIS, LUMBAR SPINE 04/09/2007  . LUMBAR DISC DISORDER 05/27/2010  . HYPERGLYCEMIA 04/09/2007    Past Surgical History  Procedure Date  . Spine surgery 2006    C-spine surgery x 2  . Back surgery   . Neck surgery     Current Outpatient Prescriptions  Medication Sig Dispense Refill  . gabapentin (NEURONTIN) 100 MG capsule Take 100 mg by mouth 3 (three) times daily.      [name] levofloxacin (LEVAQUIN) 500 MG tablet Take 1 tablet (500 mg total) by mouth daily.  7 tablet  0  . losartan-hydrochlorothiazide (HYZAAR) 100-12.5 MG per tablet Take 1 tablet by mouth daily. Back on BP med      . metoprolol succinate (TOPROL-XL) 25 MG 24 hr tablet Take 1 tablet (25 mg total) by mouth daily.  30 tablet  6  . Multiple Vitamin (MULITIVITAMIN WITH  MINERALS) TABS Take 1 tablet by mouth daily.      . pantoprazole (PROTONIX) 40 MG tablet TAKE 1 TABLET BY MOUTH ONCE DAILY  30 tablet  9  . valACYclovir (VALTREX) 1000 MG tablet Take 1 tablet (1,000 mg total) by mouth 2 (two) times daily.  14 tablet  0    Allergies as of 08/04/2011 - Review Complete 08/04/2011  Allergen Reaction Noted  . Ceftin  07/31/2011    Family History  Problem Relation Age of Onset  . Cancer Mother     Breast Cancer, Uterine Cancer  . Breast cancer Mother   . Uterine cancer Mother   . Hypertension Brother     History   Social History  . Marital Status: Single    Spouse Name: N/A    Number of Children: N/A  . Years of Education: N/A   Occupational History  . Not on file.   Social History Main Topics  . Smoking status: Former Games developer  . Smokeless tobacco: Never Used  . Alcohol Use: No  . Drug Use: No  . Sexually Active: Not on file   Other Topics Concern  . Not on file   Social History Narrative   Works in Set designer.      Physical Exam: BP 146/80  Pulse 88  Ht 5\' 11"  (1.803 m)  Wt 190 lb (86.183  kg)  BMI 26.50 kg/m2 Constitutional: generally well-appearing Psychiatric: alert and oriented x3 Abdomen: soft, nontender, nondistended, no obvious ascites, no peritoneal signs, normal bowel sounds     Assessment and plan: 61 y.o. male with intermittent minor rectal bleeding, chronic GERD without alarm symptoms  Colonoscopy was about 8 years ago. Which is relatively new intermittent rectal bleeding we should repeat that now. I have given her a prescription for Protonix at twice daily since he tends to need it twice a day. He has no alarm symptoms and does not need repeat EGD at this point.

## 2011-08-10 ENCOUNTER — Ambulatory Visit (INDEPENDENT_AMBULATORY_CARE_PROVIDER_SITE_OTHER): Payer: 59 | Admitting: Cardiovascular Disease

## 2011-08-10 ENCOUNTER — Encounter: Payer: Self-pay | Admitting: Cardiovascular Disease

## 2011-08-10 DIAGNOSIS — I2510 Atherosclerotic heart disease of native coronary artery without angina pectoris: Secondary | ICD-10-CM

## 2011-08-10 DIAGNOSIS — I10 Essential (primary) hypertension: Secondary | ICD-10-CM

## 2011-08-10 DIAGNOSIS — E7800 Pure hypercholesterolemia, unspecified: Secondary | ICD-10-CM

## 2011-08-10 DIAGNOSIS — E785 Hyperlipidemia, unspecified: Secondary | ICD-10-CM

## 2011-08-10 MED ORDER — PRAVASTATIN SODIUM 40 MG PO TABS: 40.0000 mg | ORAL_TABLET | Freq: Every day | ORAL | Status: DC

## 2011-08-10 MED ORDER — METOPROLOL SUCCINATE ER 25 MG PO TB24: 25.0000 mg | ORAL_TABLET | Freq: Every day | ORAL | Status: DC

## 2011-08-10 MED ORDER — LOSARTAN POTASSIUM-HCTZ 100-12.5 MG PO TABS: 1.0000 | ORAL_TABLET | Freq: Every day | ORAL | Status: DC

## 2011-08-10 NOTE — Progress Notes (Signed)
HPI:  61 year old gentleman presenting for followup evaluation. The patient has a history of nonobstructive coronary artery disease and has been managed medically. He also is followed for hypertension. He had back surgery last year and underwent a stress Myoview scan prior to surgery. This demonstrated a gated left ventricular ejection fraction of 61% and inferior and apical thinning likely consistent with attenuation artifact. Perfusion was otherwise normal. Overall interpretation was low risk.  The patient feels well has no complaints today. He is concerned that he is going to be terminated from his job in several months and will not have health insurance coverage after that. He denies chest pain or pressure. He denies dyspnea, edema, or palpitations. He is compliant with his medications.  Outpatient Encounter Prescriptions as of 08/10/2011  Medication Sig Dispense Refill  . gabapentin (NEURONTIN) 100 MG capsule Take 100 mg by mouth 3 (three) times daily.      [name] losartan-hydrochlorothiazide (HYZAAR) 100-12.5 MG per tablet Take 1 tablet by mouth daily. Back on BP med      . metoprolol succinate (TOPROL-XL) 25 MG 24 hr tablet Take 1 tablet (25 mg total) by mouth daily.  30 tablet  6  . Multiple Vitamin (MULITIVITAMIN WITH MINERALS) TABS Take 1 tablet by mouth daily.      . pantoprazole (PROTONIX) 40 MG tablet Take 1 tablet (40 mg total) by mouth 2 (two) times daily before a meal.  60 tablet  11  . DISCONTD: levofloxacin (LEVAQUIN) 500 MG tablet Take 1 tablet (500 mg total) by mouth daily.  7 tablet  0  . DISCONTD: peg 3350 powder (MOVIPREP) 100 G SOLR Take 1 kit (100 g total) by mouth as directed. See written handout  1 kit  0  . DISCONTD: valACYclovir (VALTREX) 1000 MG tablet Take 1 tablet (1,000 mg total) by mouth 2 (two) times daily.  14 tablet  0    Allergies  Allergen Reactions  . Ceftin     Past Medical History  Diagnosis Date  . HYPERLIPIDEMIA 04/09/2007  . HYPERTENSION, BENIGN  04/19/2010  . CAD, NATIVE VESSEL 04/19/2010    nonobstructive by cath 2007:  oLAD 20-30%, mLAD 50%, pCFX 20-30%, oAVCFX 20-30%, L renal art 50%;  normal LVF  . GERD 04/09/2007  . RENAL CYST 05/27/2010  . OSTEOARTHRITIS, LUMBAR SPINE 04/09/2007  . LUMBAR DISC DISORDER 05/27/2010  . HYPERGLYCEMIA 04/09/2007    ROS: Negative except as per HPI  BP 118/72  Pulse 78  Ht 5\' 11"  (1.803 m)  Wt 86.637 kg (191 lb)  BMI 26.64 kg/m2  PHYSICAL EXAM: Pt is alert and oriented, NAD HEENT: normal Neck: JVP - normal, carotids 2+= without bruits Lungs: CTA bilaterally CV: RRR without murmur or gallop Abd: soft, NT, Positive BS, no hepatomegaly Ext: no C/C/E, distal pulses intact and equal Skin: warm/dry no rash  EKG:  Normal sinus rhythm with nonspecific ST and T wave abnormality, heart rate 78 beats per minute.  ASSESSMENT AND PLAN:

## 2011-08-10 NOTE — Assessment & Plan Note (Signed)
The patient is stable without angina. He had a low risk nuclear scan last year. He will continue his current medical program and follow up in 6 months.

## 2011-08-10 NOTE — Assessment & Plan Note (Signed)
Blood pressure is well controlled on combination of losartan, hydrochlorothiazide, and metoprolol succinate. He will let us know if any of this is cause prohibitive once he loses his insurance. He otherwise will continue his current medications.

## 2011-08-10 NOTE — Patient Instructions (Signed)
Your physician has recommended you make the following change in your medication: START Pravastatin 40mg  take one by mouth daily  Your physician recommends that you return for a FASTING LIPID and LIVER Profile in 3 MONTHS--nothing to eat or drink after midnight, lab opens at 8:30  Your physician wants you to follow-up in: 6 MONTHS.  You will receive a reminder letter in the mail two months in advance. If you don't receive a letter, please call our office to schedule the follow-up appointment.

## 2011-08-10 NOTE — Assessment & Plan Note (Signed)
[name] most recent lipids were reviewed. He has a cholesterol of 161, HDL 37, and LDL 96. He does have coronary atherosclerosis and I recommended a low-dose statin to help prevent plaque progression. He will start pravastatin 40 mg daily. He has tolerated statin drugs in the past without difficulty. He should have followup lipids and LFTs in 12 weeks.

## 2011-08-11 ENCOUNTER — Ambulatory Visit (INDEPENDENT_AMBULATORY_CARE_PROVIDER_SITE_OTHER): Payer: 59 | Admitting: Endocrinology

## 2011-08-11 ENCOUNTER — Encounter: Payer: Self-pay | Admitting: Endocrinology

## 2011-08-11 DIAGNOSIS — Z0000 Encounter for general adult medical examination without abnormal findings: Secondary | ICD-10-CM

## 2011-08-11 NOTE — Patient Instructions (Addendum)
please consider these measures for your health:  minimize alcohol.  do not use tobacco products.  have a colonoscopy at least every 10 years from age 60.  keep firearms safely stored.  always use seat belts.  have working smoke alarms in your home.  see an eye doctor and dentist regularly.  never drive under the influence of alcohol or drugs (including prescription drugs).  those with fair skin should take precautions against the sun. please let me know what your wishes would be, if artificial life support measures should become necessary.  it is critically important to prevent falling down (keep floor areas well-lit, dry, and free of loose objects.  If you have a cane, walker, or wheelchair, you should use it, even for short trips around the house.  Also, try not to rush). Please return in 1 year.   

## 2011-08-11 NOTE — Progress Notes (Signed)
Subjective:    Patient ID: Jeff Lopez, male    DOB: 04-04-1951, 61 y.o.   MRN: [id number]  HPI here for regular wellness examination.  He's feeling pretty well in general, and says chronic med probs are stable, except as noted below.  He seldom smokes now. Past Medical History  Diagnosis Date  . HYPERLIPIDEMIA 04/09/2007  . HYPERTENSION, BENIGN 04/19/2010  . CAD, NATIVE VESSEL 04/19/2010    nonobstructive by cath 2007:  oLAD 20-30%, mLAD 50%, pCFX 20-30%, oAVCFX 20-30%, L renal art 50%;  normal LVF  . GERD 04/09/2007  . RENAL CYST 05/27/2010  . OSTEOARTHRITIS, LUMBAR SPINE 04/09/2007  . LUMBAR DISC DISORDER 05/27/2010  . HYPERGLYCEMIA 04/09/2007    Past Surgical History  Procedure Date  . Spine surgery 2006    C-spine surgery x 2  . Back surgery   . Neck surgery     History   Social History  . Marital Status: Single    Spouse Name: N/A    Number of Children: N/A  . Years of Education: N/A   Occupational History  . Not on file.   Social History Main Topics  . Smoking status: Former Games developer  . Smokeless tobacco: Never Used  . Alcohol Use: No  . Drug Use: No  . Sexually Active: Not on file   Other Topics Concern  . Not on file   Social History Narrative   Works in Set designer.    Current Outpatient Prescriptions on File Prior to Visit  Medication Sig Dispense Refill  . losartan-hydrochlorothiazide (HYZAAR) 100-12.5 MG per tablet Take 1 tablet by mouth daily. Back on BP med  30 tablet  11  . metoprolol succinate (TOPROL-XL) 25 MG 24 hr tablet Take 1 tablet (25 mg total) by mouth daily.  30 tablet  11  . Multiple Vitamin (MULITIVITAMIN WITH MINERALS) TABS Take 1 tablet by mouth daily.      . pantoprazole (PROTONIX) 40 MG tablet Take 1 tablet (40 mg total) by mouth 2 (two) times daily before a meal.  60 tablet  11  . pravastatin (PRAVACHOL) 40 MG tablet Take 1 tablet (40 mg total) by mouth daily.  30 tablet  11  . gabapentin (NEURONTIN) 100 MG capsule Take  100 mg by mouth 3 (three) times daily.        Allergies  Allergen Reactions  . Ceftin     Family History  Problem Relation Age of Onset  . Cancer Mother     Breast Cancer, Uterine Cancer  . Breast cancer Mother   . Uterine cancer Mother   . Hypertension Brother     BP 134/84  Pulse 79  Temp(Src) 98.2 F (36.8 C) (Oral)  Ht 5\' 11"  (1.803 m)  Wt 190 lb 12.8 oz (86.546 kg)  BMI 26.61 kg/m2  SpO2 94%   Ortho: nitka Cardiol: cooper GI: jacobs  Review of Systems  Constitutional: Negative for fever and unexpected weight change.  HENT: Negative for hearing loss.   Eyes: Negative for visual disturbance.  Respiratory: Negative for shortness of breath.   Cardiovascular: Negative for chest pain.  Gastrointestinal: Negative for anal bleeding.  Genitourinary: Negative for hematuria and difficulty urinating.  Musculoskeletal:       Sees dr Otelia Sergeant for low-back pain  Skin: Negative for rash.  Neurological: Negative for syncope and numbness.  Hematological: Bruises/bleeds easily.  Psychiatric/Behavioral: Negative for dysphoric mood.      Objective:   Physical Exam VS: see vs page GEN: no distress HEAD:  head: no deformity eyes: no periorbital swelling, no proptosis external nose and ears are normal mouth: no lesion seen NECK: supple, thyroid is not enlarged CHEST WALL: no deformity LUNGS: clear to auscultation BREASTS:  No gynecomastia CV: reg rate and rhythm, no murmur ABD: abdomen is soft, nontender.  no hepatosplenomegaly.  not distended.  no hernia RECTAL: normal external and internal exam.  heme neg. PROSTATE:  Normal size.  No nodule MUSCULOSKELETAL: muscle bulk and strength are grossly normal.  no obvious joint swelling.  gait is normal and steady EXTEMITIES: no deformity.  no ulcer on the feet.  feet are of normal color and temp.  no edema PULSES: dorsalis pedis intact bilat.  no carotid bruit. NEURO:  cn 2-12 grossly intact.   readily moves all 4's.  sensation  is intact to touch on the feet SKIN:  Normal texture and temperature.  No rash or suspicious lesion is visible.   NODES:  None palpable at the neck PSYCH: alert, oriented x3.  Does not appear anxious nor depressed.       Assessment & Plan:  Wellness visit today, with problems stable, except as noted.

## 2011-08-12 DIAGNOSIS — Z0000 Encounter for general adult medical examination without abnormal findings: Secondary | ICD-10-CM | POA: Insufficient documentation

## 2011-08-30 ENCOUNTER — Encounter: Payer: Self-pay | Admitting: Gastroenterology

## 2011-08-30 ENCOUNTER — Ambulatory Visit (AMBULATORY_SURGERY_CENTER): Payer: 59 | Admitting: Gastroenterology

## 2011-08-30 VITALS — BP 127/78 | HR 72 | Temp 96.2°F | Resp 20 | Ht 71.0 in | Wt 190.0 lb

## 2011-08-30 DIAGNOSIS — K219 Gastro-esophageal reflux disease without esophagitis: Secondary | ICD-10-CM

## 2011-08-30 DIAGNOSIS — K644 Residual hemorrhoidal skin tags: Secondary | ICD-10-CM

## 2011-08-30 DIAGNOSIS — D126 Benign neoplasm of colon, unspecified: Secondary | ICD-10-CM

## 2011-08-30 DIAGNOSIS — K625 Hemorrhage of anus and rectum: Secondary | ICD-10-CM

## 2011-08-30 DIAGNOSIS — K648 Other hemorrhoids: Secondary | ICD-10-CM

## 2011-08-30 DIAGNOSIS — Z1211 Encounter for screening for malignant neoplasm of colon: Secondary | ICD-10-CM

## 2011-08-30 MED ORDER — SODIUM CHLORIDE 0.9 % IV SOLN: 500.0000 mL | INTRAVENOUS | Status: DC

## 2011-08-30 NOTE — Op Note (Signed)
[facility] [address] [place], [place]  [address]  COLONOSCOPY PROCEDURE REPORT  PATIENT:  Jeff Lopez, Jeff Lopez  MR#:  [id number] BIRTHDATE:  1950/09/16, 60 yrs. old  GENDER:  male ENDOSCOPIST:  Rachael Fee, MD PROCEDURE DATE:  08/30/2011 PROCEDURE:  Colonoscopy with snare polypectomy ASA CLASS:  Class II INDICATIONS:  Routine risk for colon cancer: colonoscopy with SML in 03/2004 found 7mm HP polyp.  Recent minor rectal bleeding. MEDICATIONS:   Fentanyl 75 mcg IV, These medications were titrated to patient response per physician's verbal order, Versed 7 mg IV DESCRIPTION OF PROCEDURE:   After the risks benefits and alternatives of the procedure were thoroughly explained, informed consent was obtained.  Digital rectal exam was performed and revealed no rectal masses.   The LB 180AL [id number] endoscope was introduced through the anus and advanced to the cecum, which was identified by both the appendix and ileocecal valve, without limitations.  The quality of the prep was good..  The instrument was then slowly withdrawn as the colon was fully examined. <<PROCEDUREIMAGES>> FINDINGS:  Two polyps were found. One was 5mm across, sessile, located in ascending colon, removed with cold snare, sent to pathology (jar 1). One was 7mm, pedunculated, located in sigmoid, removed with snare/cautery and sent to pathology (jar 1) (see image5, image6, and image7).  Internal and external Hemorrhoids were found.  This was otherwise a normal examination of the colon (see image9, image2, and image1).   Retroflexed views in the rectum revealed no abnormalities. COMPLICATIONS:  None  ENDOSCOPIC IMPRESSION: 1) Two polyps, both were removed and both were sent to pathology  2) Internal and external hemorrhoids; this is likely cause of your intermittent rectal bleeding 3) Otherwise normal examination  RECOMMENDATIONS: 1) If the polyp(s) removed today are proven to be  adenomatous (pre-cancerous) polyps, you will need a repeat colonoscopy in 5 years. Otherwise you should continue to follow colorectal cancer screening guidelines for "routine risk" patients with colonoscopy in 10 years. You will receive a letter within 1-2 weeks with the results of your biopsy as well as final recommendations. Please call my office if you have not received a letter after 3 weeks.  ______________________________ Rachael Fee, MD  n. eSIGNED:   Rachael Fee at 08/30/2011 11:54 AM  Rich Brave, [phone]

## 2011-08-30 NOTE — Progress Notes (Signed)
Patient did not experience any of the following events: a burn prior to discharge; a fall within the facility; wrong site/side/patient/procedure/implant event; or a hospital transfer or hospital admission upon discharge from the facility. (G8907) Patient did not have preoperative order for IV antibiotic SSI prophylaxis. (G8918)  

## 2011-08-30 NOTE — Patient Instructions (Signed)
Impressions/Recommendations:  Polyps (handout given) Hemorrhoids (handout given)  Next colonoscopy will be determined based on the results of the pathology of the polyps removed today. You will receive a letter from [place] within the next couple of weeks.   YOU HAD AN ENDOSCOPIC PROCEDURE TODAY AT THE [facility]: Refer to the procedure report that was given to you for any specific questions about what was found during the examination.  If the procedure report does not answer your questions, please call your gastroenterologist to clarify.  If you requested that your care partner not be given the details of your procedure findings, then the procedure report has been included in a sealed envelope for you to review at your convenience later.  YOU SHOULD EXPECT: Some feelings of bloating in the abdomen. Passage of more gas than usual.  Walking can help get rid of the air that was put into your GI tract during the procedure and reduce the bloating. If you had a lower endoscopy (such as a colonoscopy or flexible sigmoidoscopy) you may notice spotting of blood in your stool or on the toilet paper. If you underwent a bowel prep for your procedure, then you may not have a normal bowel movement for a few days.  DIET: Your first meal following the procedure should be a light meal and then it is ok to progress to your normal diet.  A half-sandwich or bowl of soup is an example of a good first meal.  Heavy or fried foods are harder to digest and may make you feel nauseous or bloated.  Likewise meals heavy in dairy and vegetables can cause extra gas to form and this can also increase the bloating.  Drink plenty of fluids but you should avoid alcoholic beverages for 24 hours.  ACTIVITY: Your care partner should take you home directly after the procedure.  You should plan to take it easy, moving slowly for the rest of the day.  You can resume normal activity the day after the procedure however you should NOT  DRIVE or use heavy machinery for 24 hours (because of the sedation medicines used during the test).    SYMPTOMS TO REPORT IMMEDIATELY: A gastroenterologist can be reached at any hour.  During normal business hours, 8:30 AM to 5:00 PM Monday through Friday, call [phone].  After hours and on weekends, please call the GI answering service at [phone] who will take a message and have the physician on call contact you.   Following lower endoscopy (colonoscopy or flexible sigmoidoscopy):  Excessive amounts of blood in the stool  Significant tenderness or worsening of abdominal pains  Swelling of the abdomen that is new, acute  Fever of 100F or higher  Following upper endoscopy (EGD)  Vomiting of blood or coffee ground material  New chest pain or pain under the shoulder blades  Painful or persistently difficult swallowing  New shortness of breath  Fever of 100F or higher  Black, tarry-looking stools  FOLLOW UP: If any biopsies were taken you will be contacted by phone or by letter within the next 1-3 weeks.  Call your gastroenterologist if you have not heard about the biopsies in 3 weeks.  Our staff will call the home number listed on your records the next business day following your procedure to check on you and address any questions or concerns that you may have at that time regarding the information given to you following your procedure. This is a courtesy call and so if  there is no answer at the home number and we have not heard from you through the emergency physician on call, we will assume that you have returned to your regular daily activities without incident.  SIGNATURES/CONFIDENTIALITY: You and/or your care partner have signed paperwork which will be entered into your electronic medical record.  These signatures attest to the fact that that the information above on your After Visit Summary has been reviewed and is understood.  Full responsibility of the confidentiality of  this discharge information lies with you and/or your care-partner.

## 2011-08-31 ENCOUNTER — Telehealth: Payer: Self-pay | Admitting: *Deleted

## 2011-08-31 NOTE — Telephone Encounter (Signed)
  Follow up Call-  Call back number 08/30/2011  Post procedure Call Back phone  # [phone]  Permission to leave phone message Yes     Patient questions:  Do you have a fever, pain , or abdominal swelling? no Pain Score  0 *  Have you tolerated food without any problems? yes  Have you been able to return to your normal activities? yes  Do you have any questions about your discharge instructions: Diet   no Medications  no Follow up visit  no  Do you have questions or concerns about your Care? no  Actions: * If pain score is 4 or above: No action needed, pain <4.

## 2011-09-05 ENCOUNTER — Encounter: Payer: Self-pay | Admitting: Gastroenterology

## 2011-11-03 ENCOUNTER — Other Ambulatory Visit: Payer: 59

## 2012-02-06 ENCOUNTER — Encounter: Payer: Self-pay | Admitting: Internal Medicine

## 2012-02-06 ENCOUNTER — Other Ambulatory Visit (INDEPENDENT_AMBULATORY_CARE_PROVIDER_SITE_OTHER): Payer: 59

## 2012-02-06 ENCOUNTER — Ambulatory Visit (INDEPENDENT_AMBULATORY_CARE_PROVIDER_SITE_OTHER)
Admission: RE | Admit: 2012-02-06 | Discharge: 2012-02-06 | Disposition: A | Discharge status: Home / Self Care | Payer: 59 | Source: Ambulatory Visit | Attending: Internal Medicine | Admitting: Internal Medicine

## 2012-02-06 ENCOUNTER — Ambulatory Visit (INDEPENDENT_AMBULATORY_CARE_PROVIDER_SITE_OTHER): Payer: 59 | Admitting: Internal Medicine

## 2012-02-06 VITALS — BP 128/88 | HR 67 | Temp 97.9°F | Resp 16 | Wt 192.0 lb

## 2012-02-06 DIAGNOSIS — R059 Cough, unspecified: Secondary | ICD-10-CM

## 2012-02-06 DIAGNOSIS — I10 Essential (primary) hypertension: Secondary | ICD-10-CM

## 2012-02-06 DIAGNOSIS — I2510 Atherosclerotic heart disease of native coronary artery without angina pectoris: Secondary | ICD-10-CM

## 2012-02-06 DIAGNOSIS — R9431 Abnormal electrocardiogram [ECG] [EKG]: Secondary | ICD-10-CM

## 2012-02-06 DIAGNOSIS — R079 Chest pain, unspecified: Secondary | ICD-10-CM

## 2012-02-06 DIAGNOSIS — J3089 Other allergic rhinitis: Secondary | ICD-10-CM | POA: Insufficient documentation

## 2012-02-06 DIAGNOSIS — R7309 Other abnormal glucose: Secondary | ICD-10-CM

## 2012-02-06 DIAGNOSIS — E785 Hyperlipidemia, unspecified: Secondary | ICD-10-CM

## 2012-02-06 DIAGNOSIS — R05 Cough: Secondary | ICD-10-CM

## 2012-02-06 DIAGNOSIS — J309 Allergic rhinitis, unspecified: Secondary | ICD-10-CM

## 2012-02-06 LAB — COMPREHENSIVE METABOLIC PANEL
Albumin: 4.2 g/dL (ref 3.5–5.2)
Alkaline Phosphatase: 69 U/L (ref 39–117)
BUN: 21 mg/dL (ref 6–23)
CO2: 24 mEq/L (ref 19–32)
GFR: 72.61 mL/min (ref >60.00)
Glucose, Bld: 147 mg/dL — ABNORMAL HIGH (ref 70–99)
Potassium: 3.6 mEq/L (ref 3.5–5.1)
Sodium: 138 mEq/L (ref 135–145)
Total Bilirubin: 1.1 mg/dL (ref 0.3–1.2)
Total Protein: 7.1 g/dL (ref 6.0–8.3)

## 2012-02-06 LAB — TROPONIN I: Troponin I: <0.01 ng/mL (ref <0.06)

## 2012-02-06 LAB — CBC WITH DIFFERENTIAL/PLATELET
Basophils Relative: 0.6 % (ref 0.0–3.0)
Eosinophils Relative: 3.8 % (ref 0.0–5.0)
HCT: 48.4 % (ref 39.0–52.0)
Lymphs Abs: 1.5 10*3/uL (ref 0.7–4.0)
MCV: 98.2 fl (ref 78.0–100.0)
Monocytes Absolute: 0.6 10*3/uL (ref 0.1–1.0)
Monocytes Relative: 7.9 % (ref 3.0–12.0)
RBC: 4.93 Mil/uL (ref 4.22–5.81)
WBC: 7.1 10*3/uL (ref 4.5–10.5)

## 2012-02-06 LAB — LIPID PANEL: Cholesterol: 180 mg/dL (ref 0–200)

## 2012-02-06 LAB — TSH: TSH: 0.9 u[IU]/mL (ref 0.35–5.50)

## 2012-02-06 LAB — D-DIMER, QUANTITATIVE: D-Dimer, Quant: 0.64 ug/mL-FEU — ABNORMAL HIGH (ref 0.00–0.48)

## 2012-02-06 LAB — HEMOGLOBIN A1C: Hgb A1c MFr Bld: 6.7 % — ABNORMAL HIGH (ref 4.6–6.5)

## 2012-02-06 MED ORDER — AZELASTINE-FLUTICASONE 137-50 MCG/ACT NA SUSP: 1.0000 | Freq: Two times a day (BID) | NASAL | Status: DC

## 2012-02-06 NOTE — Patient Instructions (Signed)
Chest Pain (Nonspecific) It is often hard to give a specific diagnosis for the cause of chest pain. There is always a chance that your pain could be related to something serious, such as a heart attack or a blood clot in the lungs. You need to follow up with your caregiver for further evaluation. CAUSES   Heartburn.   Pneumonia or bronchitis.   Anxiety or stress.   Inflammation around your heart (pericarditis) or lung (pleuritis or pleurisy).   A blood clot in the lung.   A collapsed lung (pneumothorax). It can develop suddenly on its own (spontaneous pneumothorax) or from injury (trauma) to the chest.   Shingles infection (herpes zoster virus).  The chest wall is composed of bones, muscles, and cartilage. Any of these can be the source of the pain.  The bones can be bruised by injury.   The muscles or cartilage can be strained by coughing or overwork.   The cartilage can be affected by inflammation and become sore (costochondritis).  DIAGNOSIS  Lab tests or other studies, such as X-rays, electrocardiography, stress testing, or cardiac imaging, may be needed to find the cause of your pain.  TREATMENT   Treatment depends on what may be causing your chest pain. Treatment may include:   Acid blockers for heartburn.   Anti-inflammatory medicine.   Pain medicine for inflammatory conditions.   Antibiotics if an infection is present.   You may be advised to change lifestyle habits. This includes stopping smoking and avoiding alcohol, caffeine, and chocolate.   You may be advised to keep your head raised (elevated) when sleeping. This reduces the chance of acid going backward from your stomach into your esophagus.   Most of the time, nonspecific chest pain will improve within 2 to 3 days with rest and mild pain medicine.  HOME CARE INSTRUCTIONS   If antibiotics were prescribed, take your antibiotics as directed. Finish them even if you start to feel better.   For the next few  days, avoid physical activities that bring on chest pain. Continue physical activities as directed.   Do not smoke.   Avoid drinking alcohol.   Only take over-the-counter or prescription medicine for pain, discomfort, or fever as directed by your caregiver.   Follow your caregiver's suggestions for further testing if your chest pain does not go away.   Keep any follow-up appointments you made. If you do not go to an appointment, you could develop lasting (chronic) problems with pain. If there is any problem keeping an appointment, you must call to reschedule.  SEEK MEDICAL CARE IF:   You think you are having problems from the medicine you are taking. Read your medicine instructions carefully.   Your chest pain does not go away, even after treatment.   You develop a rash with blisters on your chest.  SEEK IMMEDIATE MEDICAL CARE IF:   You have increased chest pain or pain that spreads to your arm, neck, jaw, back, or abdomen.   You develop shortness of breath, an increasing cough, or you are coughing up blood.   You have severe back or abdominal pain, feel nauseous, or vomit.   You develop severe weakness, fainting, or chills.   You have a fever.  THIS IS AN EMERGENCY. Do not wait to see if the pain will go away. Get medical help at once. Call your local emergency services (911 in [place] Do not drive yourself to the hospital. MAKE SURE YOU:   Understand these instructions.     Will watch your condition.   Will get help right away if you are not doing well or get worse.  Document Released: 03/22/2005 Document Revised: 06/01/2011 Document Reviewed: 01/16/2008 Exit[employer] Patient Information 2012 [employer]. 

## 2012-02-07 ENCOUNTER — Institutional Professional Consult (permissible substitution): Payer: 59 | Admitting: Pulmonary Disease

## 2012-02-07 NOTE — Assessment & Plan Note (Signed)
His EKG today is unchanged

## 2012-02-07 NOTE — Assessment & Plan Note (Signed)
I will check his CXR today to look for edema, mass, pna, etc. I am concerned that he has developed CB or COPD so I have asked him to quit smoking and to see plum for further evaluation and PFT's.

## 2012-02-07 NOTE — Progress Notes (Signed)
Subjective:    Patient ID: Jeff Lopez, male    DOB: 08-31-1950, 61 y.o.   MRN: [id number]  Cough This is a chronic problem. The current episode started 1 to 4 weeks ago. The problem has been unchanged. The problem occurs every few hours. The cough is non-productive. Associated symptoms include chest pain, rhinorrhea, a sore throat, shortness of breath and wheezing. Pertinent negatives include no chills, ear congestion, ear pain, fever, headaches, heartburn, hemoptysis, myalgias, nasal congestion, postnasal drip, rash, sweats or weight loss. Risk factors for lung disease include smoking/tobacco exposure. He has tried nothing for the symptoms.  Chest Pain  This is a new problem. The current episode started 1 to 4 weeks ago. The onset quality is gradual. The problem occurs intermittently. The problem has been unchanged. Pain location: across his upper chest bilaterally. The pain is at a severity of 1/10. The pain is mild. The quality of the pain is described as pressure. The pain does not radiate. Associated symptoms include a cough and shortness of breath. Pertinent negatives include no abdominal pain, back pain, claudication, diaphoresis, dizziness, exertional chest pressure, fever, headaches, hemoptysis, irregular heartbeat, leg pain, lower extremity edema, malaise/fatigue, nausea, near-syncope, numbness, orthopnea, palpitations, PND, sputum production, syncope, vomiting or weakness. The cough's precipitants include smoke. The cough is non-productive. He has tried nothing for the symptoms.  His past medical history is significant for CAD.      Review of Systems  Constitutional: Negative for fever, chills, weight loss, malaise/fatigue, diaphoresis, activity change, appetite change, fatigue and unexpected weight change.  HENT: Positive for congestion, sore throat and rhinorrhea. Negative for hearing loss, ear pain, nosebleeds, facial swelling, sneezing, drooling, mouth sores, trouble swallowing,  neck pain, neck stiffness, dental problem, voice change, postnasal drip, sinus pressure, tinnitus and ear discharge.   Eyes: Negative.   Respiratory: Positive for cough, shortness of breath and wheezing. Negative for apnea, hemoptysis, sputum production, choking, chest tightness and stridor.   Cardiovascular: Positive for chest pain. Negative for palpitations, orthopnea, claudication, leg swelling, syncope, PND and near-syncope.  Gastrointestinal: Negative for heartburn, nausea, vomiting, abdominal pain, diarrhea and constipation.  Genitourinary: Negative.   Musculoskeletal: Negative for myalgias, back pain, joint swelling, arthralgias and gait problem.  Skin: Negative for color change, pallor, rash and wound.  Neurological: Negative for dizziness, weakness, numbness and headaches.  Hematological: Negative for adenopathy. Does not bruise/bleed easily.  Psychiatric/Behavioral: Negative.        Objective:   Physical Exam  Vitals reviewed. Constitutional: He is oriented to person, place, and time. He appears well-developed and well-nourished.  Non-toxic appearance. He does not have a sickly appearance. He does not appear ill. No distress.  HENT:  Head: Normocephalic and atraumatic. No trismus in the jaw.  Right Ear: Hearing, tympanic membrane, external ear and ear canal normal.  Left Ear: Hearing, tympanic membrane, external ear and ear canal normal.  Nose: Mucosal edema and rhinorrhea present. No nose lacerations, sinus tenderness, nasal deformity, septal deviation or nasal septal hematoma. No epistaxis.  No foreign bodies. Right sinus exhibits no maxillary sinus tenderness and no frontal sinus tenderness. Left sinus exhibits no maxillary sinus tenderness and no frontal sinus tenderness.  Mouth/Throat: Mucous membranes are normal. Mucous membranes are not pale, not dry and not cyanotic. No oral lesions. No uvula swelling. Posterior oropharyngeal erythema ("smokers erythema") present. No  oropharyngeal exudate, posterior oropharyngeal edema or tonsillar abscesses.  Eyes: Conjunctivae are normal. Right eye exhibits no discharge. Left eye exhibits no discharge. No scleral icterus.  Neck: Normal range of motion. Neck supple. No JVD present. No tracheal deviation present. No thyromegaly present.  Cardiovascular: Normal rate, regular rhythm, normal heart sounds and intact distal pulses.  Exam reveals no gallop and no friction rub.   No murmur heard. Pulmonary/Chest: Effort normal and breath sounds normal. No accessory muscle usage or stridor. Not tachypneic. No respiratory distress. He has no decreased breath sounds. He has no wheezes. He has no rhonchi. He has no rales. He exhibits no tenderness.  Abdominal: Soft. Bowel sounds are normal. He exhibits no distension and no mass. There is no tenderness. There is no rebound and no guarding.  Musculoskeletal: Normal range of motion. He exhibits no edema and no tenderness.  Lymphadenopathy:    He has no cervical adenopathy.  Neurological: He is oriented to person, place, and time.  Skin: Skin is warm and dry. No rash noted. He is not diaphoretic. No erythema. No pallor.  Psychiatric: He has a normal mood and affect. His behavior is normal. Judgment and thought content normal.          Assessment & Plan:

## 2012-02-07 NOTE — Assessment & Plan Note (Signed)
FLP today 

## 2012-02-07 NOTE — Assessment & Plan Note (Signed)
I have asked him to try dymista

## 2012-02-07 NOTE — Assessment & Plan Note (Signed)
I will check his A1C to see if he has developed DM II 

## 2012-02-07 NOTE — Assessment & Plan Note (Signed)
His EKG is unchanged, I will check enzymes on him to today to look for CHF, cardiac ischemia, PE, structural lesions, etc.

## 2012-02-07 NOTE — Assessment & Plan Note (Signed)
He is due for a cardiology follow up 

## 2012-02-08 ENCOUNTER — Ambulatory Visit (INDEPENDENT_AMBULATORY_CARE_PROVIDER_SITE_OTHER): Payer: 59 | Admitting: Internal Medicine

## 2012-02-08 ENCOUNTER — Encounter: Payer: Self-pay | Admitting: Internal Medicine

## 2012-02-08 VITALS — BP 112/80 | HR 69 | Temp 97.9°F | Ht 71.0 in | Wt 193.4 lb

## 2012-02-08 DIAGNOSIS — J449 Chronic obstructive pulmonary disease, unspecified: Secondary | ICD-10-CM

## 2012-02-08 DIAGNOSIS — R05 Cough: Secondary | ICD-10-CM

## 2012-02-08 DIAGNOSIS — R059 Cough, unspecified: Secondary | ICD-10-CM

## 2012-02-08 NOTE — Progress Notes (Signed)
  Subjective:    Patient ID: Jeff Lopez, male    DOB: [birth year]  MRN: [id number]  HPI  68 yowm active smoker referred by Dr Leitha Schuller 02/08/2012 to pulmonary clinic for eval of cough/sob   02/08/2012 1st pulmonary ov / cc intermittent sob x 5 years assoc with sorethroat, ear and nasal congestion, typically short duration now present x 1 month better on on dymista.  Doe x 3-4 flights no problem walking flat. Presently  No unusual cough, purulent sputum or h/o hemoptysis or HB symptoms on present rx which includes qam ppi  Sleeping ok without nocturnal  or early am exacerbation  of respiratory  c/o's or need for noct saba. Also denies any obvious fluctuation of symptoms with weather or environmental changes or other aggravating or alleviating factors except as outlined above      Review of Systems  Constitutional: Positive for fever. Negative for unexpected weight change.  HENT: Positive for ear pain, congestion, sore throat, sneezing, postnasal drip and sinus pressure. Negative for nosebleeds, rhinorrhea, trouble swallowing and dental problem.   Eyes: Positive for redness. Negative for itching.  Respiratory: Positive for cough, chest tightness, shortness of breath and wheezing.   Cardiovascular: Negative for palpitations and leg swelling.  Gastrointestinal: Negative for nausea and vomiting.  Genitourinary: Negative for dysuria.  Musculoskeletal: Negative for joint swelling.  Skin: Positive for rash.  Neurological: Positive for headaches.  Hematological: Does not bruise/bleed easily.  Psychiatric/Behavioral: Negative for dysphoric mood. The patient is not nervous/anxious.        Objective:   Physical Exam Wt Readings from Last 3 Encounters:  02/08/12 193 lb 6.4 oz (87.726 kg)  02/06/12 192 lb (87.091 kg)  08/30/11 190 lb (86.183 kg)   HEENT mild turbinate edema.  Oropharynx no thrush or excess pnd or cobblestoning.  No JVD or cervical adenopathy. Mild accessory muscle  hypertrophy. Trachea midline, nl thryroid. Chest was hyperinflated by percussion with diminished breath sounds and moderate increased exp time without wheeze. Hoover sign positive at mid inspiration. Regular rate and rhythm without murmur gallop or rub or increase P2 or edema.  Abd: no hsm, nl excursion. Ext warm without cyanosis or clubbing.    cxr 02/06/12 No active cardiopulmonary disease.       Assessment & Plan:

## 2012-02-08 NOTE — Patient Instructions (Addendum)
The key is to stop smoking completely before smoking completely stops you!   Add pepcid 20 mg one at bedtime perfectly regularly and if your throat is still bothering you I recommend ENT (throat specialist) evaluation  Please schedule a follow up office visit in 6 weeks, call sooner if needed with pft's

## 2012-02-09 ENCOUNTER — Other Ambulatory Visit: Payer: Self-pay | Admitting: Orthopedic Surgery

## 2012-02-09 ENCOUNTER — Ambulatory Visit
Admission: RE | Admit: 2012-02-09 | Discharge: 2012-02-09 | Disposition: A | Discharge status: Home / Self Care | Payer: 59 | Source: Ambulatory Visit | Attending: Orthopedic Surgery | Admitting: Orthopedic Surgery

## 2012-02-09 DIAGNOSIS — M79672 Pain in left foot: Secondary | ICD-10-CM

## 2012-02-09 DIAGNOSIS — J449 Chronic obstructive pulmonary disease, unspecified: Secondary | ICD-10-CM | POA: Insufficient documentation

## 2012-02-09 NOTE — Assessment & Plan Note (Addendum)
The most common causes of chronic cough in immunocompetent adults include the following: upper airway cough syndrome (UACS), previously referred to as postnasal drip syndrome (PNDS), which is caused by variety of rhinosinus conditions; (2) asthma; (3) GERD; (4) chronic bronchitis from cigarette smoking or other inhaled environmental irritants; (5) nonasthmatic eosinophilic bronchitis; and (6) bronchiectasis.   These conditions, singly or in combination, have accounted for up to 94% of the causes of chronic cough in prospective studies.   Other conditions have constituted no >6% of the causes in prospective studies These have included bronchogenic carcinoma, chronic interstitial pneumonia, sarcoidosis, left ventricular failure, ACEI-induced cough, and aspiration from a condition associated with pharyngeal dysfunction.   This is most c/w  Classic Upper airway cough syndrome, so named because it's frequently impossible to sort out how much is  CR/sinusitis with freq throat clearing (which can be related to primary GERD)   vs  causing  secondary (" extra esophageal")  GERD from wide swings in gastric pressure that occur with throat clearing, often  promoting self use of mint and menthol lozenges that reduce the lower esophageal sphincter tone and exacerbate the problem further in a cyclical fashion.   These are the same pts (now being labeled as having "irritable larynx syndrome" by some cough centers) who not infrequently have a history of having failed to tolerate ace inhibitors,  dry powder inhalers or biphosphonates or report having atypical reflux symptoms that don't respond to standard doses of PPI , and are easily confused as having aecopd or asthma flares by even experienced allergists/ pulmonologists.   Already better with rx of rhinitis but still some early am symptoms, so try add gerd diet and pepcid 20 mg qhs and ent f/u if throat irritation continues

## 2012-02-09 NOTE — Assessment & Plan Note (Signed)
Clinically mild  I reviewed the Flethcher curve with patient that basically indicates  if you quit smoking when your best day FEV1 is still well preserved (which his appears to be) it is highly unlikely you will progress to severe disease and informed the patient there was no medication on the market that has proven to change the curve or the likelihood of progression.  Therefore stopping smoking and maintaining abstinence is the most important aspect of care, not choice of inhalers or for that matter, doctors.

## 2012-02-13 ENCOUNTER — Ambulatory Visit: Payer: 59 | Admitting: Physician Assistant

## 2012-02-13 ENCOUNTER — Telehealth: Payer: Self-pay | Admitting: *Deleted

## 2012-02-13 NOTE — Telephone Encounter (Signed)
Patient was very upsetback later today said he was doing something said bye and hung up

## 2012-02-13 NOTE — Telephone Encounter (Signed)
Talked with patient left today due to wait time pt stated it happens everytime he is in the office, I apologized profusely, offered another appointment or to come back later today said he was doing something said bye and hung up

## 2012-02-29 ENCOUNTER — Ambulatory Visit: Payer: 59 | Admitting: Physician Assistant

## 2012-03-12 ENCOUNTER — Ambulatory Visit: Payer: 59 | Admitting: Cardiovascular Disease

## 2012-04-05 ENCOUNTER — Ambulatory Visit: Payer: 59 | Admitting: Internal Medicine

## 2012-04-12 ENCOUNTER — Encounter: Payer: Self-pay | Admitting: Endocrinology

## 2012-04-12 ENCOUNTER — Ambulatory Visit (INDEPENDENT_AMBULATORY_CARE_PROVIDER_SITE_OTHER): Payer: 59 | Admitting: Endocrinology

## 2012-04-12 VITALS — BP 140/86 | HR 65 | Temp 97.6°F | Resp 16 | Ht 70.25 in | Wt 191.1 lb

## 2012-04-12 DIAGNOSIS — Z23 Encounter for immunization: Secondary | ICD-10-CM

## 2012-04-12 DIAGNOSIS — M79609 Pain in unspecified limb: Secondary | ICD-10-CM

## 2012-04-12 DIAGNOSIS — M79606 Pain in leg, unspecified: Secondary | ICD-10-CM | POA: Insufficient documentation

## 2012-04-12 MED ORDER — ROPINIROLE HCL 0.5 MG PO TABS: 0.5000 mg | ORAL_TABLET | Freq: Every day | ORAL | Status: DC

## 2012-04-12 NOTE — Progress Notes (Signed)
Subjective:    Patient ID: Jeff Lopez, male    DOB: 1950/08/12, 61 y.o.   MRN: [id number]  HPI Pt states 1 year of moderate "aching" at both thighs, but no assoc numbness.  He has some relief with back surgery, but sxs persist.  He thinks he may have rls.  Past Medical History  Diagnosis Date  . HYPERLIPIDEMIA 04/09/2007  . HYPERTENSION, BENIGN 04/19/2010  . CAD, NATIVE VESSEL 04/19/2010    nonobstructive by cath 2007:  oLAD 20-30%, mLAD 50%, pCFX 20-30%, oAVCFX 20-30%, L renal art 50%;  normal LVF  . GERD 04/09/2007  . RENAL CYST 05/27/2010  . OSTEOARTHRITIS, LUMBAR SPINE 04/09/2007  . LUMBAR DISC DISORDER 05/27/2010  . HYPERGLYCEMIA 04/09/2007    Past Surgical History  Procedure Date  . Spine surgery 2006    C-spine surgery x 2  . Back surgery   . Neck surgery     History   Social History  . Marital Status: Single    Spouse Name: N/A    Number of Children: 1  . Years of Education: N/A   Occupational History  . [employer]    Social History Main Topics  . Smoking status: Current Every Day Smoker    Types: Cigarettes  . Smokeless tobacco: Never Used   Comment: Started smoking at age 77.  Currently smoking 1 ppd  . Alcohol Use: No  . Drug Use: No  . Sexually Active: Not Currently   Other Topics Concern  . Not on file   Social History Narrative   Works in Set designer.    Current Outpatient Prescriptions on File Prior to Visit  Medication Sig Dispense Refill  . Azelastine-Fluticasone (DYMISTA) 137-50 MCG/ACT SUSP Place 1 Act into the nose 2 (two) times daily.  1 Bottle  0  . losartan-hydrochlorothiazide (HYZAAR) 100-12.5 MG per tablet Take 1 tablet by mouth daily. Back on BP med  30 tablet  11  . metoprolol succinate (TOPROL-XL) 25 MG 24 hr tablet Take 1 tablet (25 mg total) by mouth daily.  30 tablet  11  . Multiple Vitamin (MULITIVITAMIN WITH MINERALS) TABS Take 1 tablet by mouth daily.      . pantoprazole (PROTONIX) 40 MG tablet Take 1 tablet  (40 mg total) by mouth 2 (two) times daily before a meal.  60 tablet  11  . pravastatin (PRAVACHOL) 40 MG tablet Take 1 tablet (40 mg total) by mouth daily.  30 tablet  11  . rOPINIRole (REQUIP) 0.5 MG tablet Take 1 tablet (0.5 mg total) by mouth at bedtime.  30 tablet  11    Allergies  Allergen Reactions  . Ceftin Anaphylaxis    Family History  Problem Relation Age of Onset  . Cancer Mother     Breast Cancer, Uterine Cancer  . Breast cancer Mother   . Uterine cancer Mother   . Hypertension Brother   . Heart disease Mother     BP 140/86  Pulse 65  Temp 97.6 F (36.4 C) (Oral)  Resp 16  Ht 5' 10.25" (1.784 m)  Wt 191 lb 2 oz (86.694 kg)  BMI 27.23 kg/m2  SpO2 95%  Review of Systems Denies rash, but he has insomnia.     Objective:   Physical Exam VITAL SIGNS:  See vs page GENERAL: no distress  LE's:  nontender Neuro: sensation is intact to touch muscle bulk and strength are grossly normal.  no obvious joint swelling.  gait is normal and steady. Left foot is  in an orthopedic shoe  Lab Results  Component Value Date   WBC 7.1 02/06/2012   HGB 16.0 02/06/2012   HCT 48.4 02/06/2012   PLT 159.0 02/06/2012   GLUCOSE 147* 02/06/2012   CHOL 180 02/06/2012   TRIG 138.0 02/06/2012   HDL 40.80 02/06/2012   LDLCALC 112* 02/06/2012   ALT 37 02/06/2012   AST 23 02/06/2012   NA 138 02/06/2012   K 3.6 02/06/2012   CL 103 02/06/2012   CREATININE 1.1 02/06/2012   BUN 21 02/06/2012   CO2 24 02/06/2012   TSH 0.90 02/06/2012   PSA 1.15 07/26/2011   INR 0.86 01/25/2011   HGBA1C 6.7* 02/06/2012      Assessment & Plan:  Leg sxs, new, uncertain etiology

## 2012-04-12 NOTE — Patient Instructions (Addendum)
Let's check a test of the muscles and nerve endings of the legs.  you will receive a phone call, about a day and time for an appointment.  i have sent a prescription to your pharmacy, to help your symptoms.  I hope you feel better soon.  If you don't feel better by next week, please call back, so we can increase it.  Please call sooner if you get worse.

## 2012-05-10 ENCOUNTER — Ambulatory Visit (INDEPENDENT_AMBULATORY_CARE_PROVIDER_SITE_OTHER): Payer: 59 | Admitting: Endocrinology

## 2012-05-10 DIAGNOSIS — Z23 Encounter for immunization: Secondary | ICD-10-CM

## 2012-06-10 ENCOUNTER — Ambulatory Visit (INDEPENDENT_AMBULATORY_CARE_PROVIDER_SITE_OTHER): Payer: 59 | Admitting: Endocrinology

## 2012-06-10 ENCOUNTER — Encounter: Payer: Self-pay | Admitting: Endocrinology

## 2012-06-10 VITALS — BP 122/80 | HR 79 | Temp 98.7°F | Wt 188.0 lb

## 2012-06-10 DIAGNOSIS — E1143 Type 2 diabetes mellitus with diabetic autonomic (poly)neuropathy: Secondary | ICD-10-CM | POA: Insufficient documentation

## 2012-06-10 DIAGNOSIS — I2510 Atherosclerotic heart disease of native coronary artery without angina pectoris: Secondary | ICD-10-CM

## 2012-06-10 DIAGNOSIS — E119 Type 2 diabetes mellitus without complications: Secondary | ICD-10-CM

## 2012-06-10 DIAGNOSIS — J449 Chronic obstructive pulmonary disease, unspecified: Secondary | ICD-10-CM

## 2012-06-10 NOTE — Progress Notes (Signed)
Subjective:    Patient ID: Jeff Lopez, male    DOB: Oct 30, 1950, 61 y.o.   MRN: [id number]  HPI The state of at least three ongoing medical problems is addressed today, with interval history of each noted here: Pt returns for f/u of type 2 DM (dx'ed 2013; complicated by CAD).  He is unaware of dx of DM.  Denies weight change. COPD: denies sob.  He says he has never seen dr wert, but epic shows a visit.  He did not ret for f/u CAD: he denies chest pain.  Pt says he is hesitant to go back to dr cooper.  i told him he needs to go before any surgery. Past Medical History  Diagnosis Date  . HYPERLIPIDEMIA 04/09/2007  . HYPERTENSION, BENIGN 04/19/2010  . CAD, NATIVE VESSEL 04/19/2010    nonobstructive by cath 2007:  oLAD 20-30%, mLAD 50%, pCFX 20-30%, oAVCFX 20-30%, L renal art 50%;  normal LVF  . GERD 04/09/2007  . RENAL CYST 05/27/2010  . OSTEOARTHRITIS, LUMBAR SPINE 04/09/2007  . LUMBAR DISC DISORDER 05/27/2010  . HYPERGLYCEMIA 04/09/2007    Past Surgical History  Procedure Date  . Spine surgery 2006    C-spine surgery x 2  . Back surgery   . Neck surgery     History   Social History  . Marital Status: Single    Spouse Name: N/A    Number of Children: 1  . Years of Education: N/A   Occupational History  . [employer]    Social History Main Topics  . Smoking status: Current Every Day Smoker    Types: Cigarettes  . Smokeless tobacco: Never Used     Comment: Started smoking at age 61.  Currently smoking 1 ppd  . Alcohol Use: No  . Drug Use: No  . Sexually Active: Not Currently   Other Topics Concern  . Not on file   Social History Narrative   Works in Set designer.    Current Outpatient Prescriptions on File Prior to Visit  Medication Sig Dispense Refill  . Azelastine-Fluticasone (DYMISTA) 137-50 MCG/ACT SUSP Place 1 Act into the nose 2 (two) times daily.  1 Bottle  0  . losartan-hydrochlorothiazide (HYZAAR) 100-12.5 MG per tablet Take 1 tablet by mouth  daily. Back on BP med  30 tablet  11  . metoprolol succinate (TOPROL-XL) 25 MG 24 hr tablet Take 1 tablet (25 mg total) by mouth daily.  30 tablet  11  . Multiple Vitamin (MULITIVITAMIN WITH MINERALS) TABS Take 1 tablet by mouth daily.      [name] oxaprozin (DAYPRO) 600 MG tablet       . oxyCODONE-acetaminophen (PERCOCET/ROXICET) 5-325 MG per tablet       . pantoprazole (PROTONIX) 40 MG tablet Take 1 tablet (40 mg total) by mouth 2 (two) times daily before a meal.  60 tablet  11  . pravastatin (PRAVACHOL) 40 MG tablet Take 1 tablet (40 mg total) by mouth daily.  30 tablet  11  . rOPINIRole (REQUIP) 0.5 MG tablet Take 1 tablet (0.5 mg total) by mouth at bedtime.  30 tablet  11    Allergies  Allergen Reactions  . Ceftin Anaphylaxis    Family History  Problem Relation Age of Onset  . Cancer Mother     Breast Cancer, Uterine Cancer  . Breast cancer Mother   . Uterine cancer Mother   . Hypertension Brother   . Heart disease Mother     BP 122/80  Pulse 79  Temp 98.7 F (37.1 C) (Oral)  Wt 188 lb (85.276 kg)  SpO2 95%  Review of Systems Denies cough and numbness.    Objective:   Physical Exam Pulses: dorsalis pedis intact bilat.   Feet: no deformity.  no ulcer on the feet.  feet are of normal color and temp.  no edema.  healed surgical scar on the dorsal aspect of the left foot.  Neuro: sensation is intact to touch on the feet Lab Results  Component Value Date   HGBA1C 6.3* 06/10/2012      Assessment & Plan:  DM: well-controlled COPD: he needs to go for f/u in order to be cleared for surgery. CAD: he needs to go back for f/u in order to be cleared for surgery

## 2012-06-10 NOTE — Patient Instructions (Addendum)
Please come back for a regular physical appointment in 4 months.   blood tests are being requested for you today.  We'll contact you with results.  Refer back to drs wert and cooper.  you will receive a phone call, about days and times for appointments.  For your safety, you'll need to see these 2 doctors before having your surgery. good diet and exercise habits significanly improve the control of your diabetes.  please let me know if you wish to be referred to a dietician.  high blood sugar is very risky to your health.  you should see an eye doctor every year.  You are at higher than average risk for pneumonia and hepatitis-B.  You should be vaccinated against both.   controlling your blood pressure and cholesterol drastically reduces the damage diabetes does to your body.  this also applies to quitting smoking.  please discuss these with your doctor.  you should take an aspirin every day, unless you have been advised by a doctor not to.

## 2012-06-13 LAB — MICROALBUMIN / CREATININE URINE RATIO
Creatinine, Urine: 130.1 mg/dL
Microalb Creat Ratio: 75.8 mg/g — ABNORMAL HIGH (ref 0.0–30.0)
Microalb, Ur: 9.86 mg/dL — ABNORMAL HIGH (ref 0.00–1.89)

## 2012-06-14 ENCOUNTER — Ambulatory Visit: Payer: 59 | Admitting: Internal Medicine

## 2012-06-18 ENCOUNTER — Encounter: Payer: Self-pay | Admitting: Internal Medicine

## 2012-06-18 ENCOUNTER — Ambulatory Visit (INDEPENDENT_AMBULATORY_CARE_PROVIDER_SITE_OTHER): Payer: 59 | Admitting: Internal Medicine

## 2012-06-18 VITALS — BP 124/80 | HR 67 | Temp 98.5°F | Ht 71.0 in | Wt 188.0 lb

## 2012-06-18 DIAGNOSIS — J449 Chronic obstructive pulmonary disease, unspecified: Secondary | ICD-10-CM

## 2012-06-18 DIAGNOSIS — F17200 Nicotine dependence, unspecified, uncomplicated: Secondary | ICD-10-CM

## 2012-06-18 NOTE — Progress Notes (Signed)
  Subjective:    Patient ID: Jeff Lopez, male    DOB: 1951/01/25  MRN: [id number]  HPI  74 yowm active smoker referred by Dr Leitha Schuller 02/08/2012 to pulmonary clinic for eval of cough/sob   02/08/2012 1st pulmonary ov / cc intermittent sob x 5 years assoc with sorethroat, ear and nasal congestion, typically short duration now present x 1 month better on on dymista.  Doe x 3-4 flights no problem walking flat.  At time of ov No unusual cough, purulent sputum or h/o hemoptysis or HB symptoms on present rx which includes qam ppi rec The key is to stop smoking completely before smoking completely stops you!  Add pepcid 20 mg one at bedtime perfectly regularly and if your throat is still bothering you I recommend ENT (throat specialist) evaluation  follow up office visit in 6 weeks, call sooner if needed with pft's    06/18/2012 f/u ov/Jeff Lopez cc need clearance for back surgery, never had pft's but not limited by breathing.  No obvious daytime variabilty or assoc chronic cough or cp or chest tightness, subjective wheeze overt sinus or hb symptoms. No unusual exp hx or h/o childhood pna/ asthma or premature birth to his knowledge.   Sleeping ok without nocturnal  or early am exacerbation  of respiratory  c/o's or need for noct saba. Also denies any obvious fluctuation of symptoms with weather or environmental changes or other aggravating or alleviating factors except as outlined above   ROS  The following are not active complaints unless bolded sore throat, dysphagia, dental problems, itching, sneezing,  nasal congestion or excess/ purulent secretions, ear ache,   fever, chills, sweats, unintended wt loss, pleuritic or exertional cp, hemoptysis,  orthopnea pnd or leg swelling, presyncope, palpitations, heartburn, abdominal pain, anorexia, nausea, vomiting, diarrhea  or change in bowel or urinary habits, change in stools or urine, dysuria,hematuria,  rash, arthralgias, visual complaints, headache,  numbness weakness or ataxia or problems with walking or coordination,  change in mood/affect or memory.             Objective:   Physical Exam Wt 188 06/18/2012  Wt Readings from Last 3 Encounters:  02/08/12 193 lb 6.4 oz (87.726 kg)  02/06/12 192 lb (87.091 kg)  08/30/11 190 lb (86.183 kg)   HEENT mild turbinate edema.  Oropharynx no thrush or excess pnd or cobblestoning.  No JVD or cervical adenopathy. Mild accessory muscle hypertrophy. Trachea midline, nl thryroid. Chest was hyperinflated by percussion with diminished breath sounds and moderate increased exp time without wheeze. Hoover sign positive at mid inspiration. Regular rate and rhythm without murmur gallop or rub or increase P2 or edema.  Abd: no hsm, nl excursion. Ext warm without cyanosis or clubbing.    cxr 02/06/12 No active cardiopulmonary disease  Spirometry 06/18/2012  WNL       Assessment & Plan:

## 2012-06-18 NOTE — Assessment & Plan Note (Signed)
Spirometry nl so this is GOLD 0 copd - at risk but no significant airflow obstruction and no contraindication to surgery.  Active smoking discussed separately

## 2012-06-18 NOTE — Assessment & Plan Note (Signed)
>   3 min I reviewed the Flethcher curve with patient that basically indicates  if you quit smoking when your best day FEV1 is still well preserved (which his certainly is) it is highly unlikely you will progress to severe disease and informed the patient there was no medication on the market that has proven to change the curve or the likelihood of progression.  Therefore stopping smoking and maintaining abstinence is the most important aspect of care, not choice of inhalers or for that matter, doctors.    He needs to completely quit smoking in the short term to reduce risk of post op complications

## 2012-06-18 NOTE — Patient Instructions (Addendum)
You are cleared for surgery but you should try not to smoke at all between now and then to reduce your risk of pulmonary complications

## 2012-07-04 ENCOUNTER — Encounter: Payer: Self-pay | Admitting: Cardiovascular Disease

## 2012-07-04 ENCOUNTER — Ambulatory Visit (INDEPENDENT_AMBULATORY_CARE_PROVIDER_SITE_OTHER): Payer: 59 | Admitting: Cardiovascular Disease

## 2012-07-04 VITALS — BP 130/80 | HR 57 | Ht 71.0 in | Wt 190.0 lb

## 2012-07-04 DIAGNOSIS — R0989 Other specified symptoms and signs involving the circulatory and respiratory systems: Secondary | ICD-10-CM

## 2012-07-04 NOTE — Patient Instructions (Addendum)
Your physician wants you to follow-up in: 1 year with Dr. Cooper.  You will receive a reminder letter in the mail two months in advance. If you don't receive a letter, please call our office to schedule the follow-up appointment.  

## 2012-07-04 NOTE — Progress Notes (Signed)
   HPI:   62 year old gentleman presenting for followup evaluation. The patient has a history of nonobstructive coronary artery disease and has been managed medically. The patient had cardiac catheterization in 2007 with 50% stenosis of the LAD noted. He has been managed medically. He also is followed for hypertension. Myoview scan was done in 2012. This demonstrated a gated left ventricular ejection fraction of 61% and inferior and apical thinning likely consistent with attenuation artifact. Perfusion was otherwise normal. Overall interpretation was low risk.  The patient presents today for preoperative cardiovascular evaluation. He has an upcoming lumbar fusion which will be his fourth back surgery. [name] had a constant aching pain in his left leg related to a herniated desk.  From a cardiovascular perspective he feels well. He denies chest pain, chest pressure, palpitations, leg swelling, or lightheadedness. He has no dyspnea at rest or with exertion. He's been limited by his back problems.  He does complain of pulsatile tinnitus in his right ear.  Outpatient Encounter Prescriptions as of 07/04/2012  Medication Sig Dispense Refill  . losartan-hydrochlorothiazide (HYZAAR) 100-12.5 MG per tablet Take 1 tablet by mouth daily. Back on BP med  30 tablet  11  . metoprolol succinate (TOPROL-XL) 25 MG 24 hr tablet Take 1 tablet (25 mg total) by mouth daily.  30 tablet  11  . pantoprazole (PROTONIX) 40 MG tablet Take 1 tablet (40 mg total) by mouth 2 (two) times daily before a meal.  60 tablet  11    Allergies  Allergen Reactions  . Ceftin Anaphylaxis    Past Medical History  Diagnosis Date  . HYPERLIPIDEMIA 04/09/2007  . HYPERTENSION, BENIGN 04/19/2010  . CAD, NATIVE VESSEL 04/19/2010    nonobstructive by cath 2007:  oLAD 20-30%, mLAD 50%, pCFX 20-30%, oAVCFX 20-30%, L renal art 50%;  normal LVF  . GERD 04/09/2007  . RENAL CYST 05/27/2010  . OSTEOARTHRITIS, LUMBAR SPINE 04/09/2007  .  LUMBAR DISC DISORDER 05/27/2010  . HYPERGLYCEMIA 04/09/2007    ROS: Negative except as per HPI  BP 130/80  Pulse 57  Ht 5\' 11"  (1.803 m)  Wt 86.183 kg (190 lb)  BMI 26.50 kg/m2  PHYSICAL EXAM: Pt is alert and oriented, NAD HEENT: normal Neck: JVP - normal, carotids 2+= with a soft right carotid bruit Lungs: CTA bilaterally CV: RRR without murmur or gallop Abd: soft, NT, Positive BS, no hepatomegaly Ext: no C/C/E, distal pulses intact and equal Skin: warm/dry no rash  EKG:  Sinus bradycardia 57 beats per minute, within normal limits.  ASSESSMENT AND PLAN: 1. Coronary atherosclerosis, native vessel. The patient had nonobstructive CAD at cardiac cath in 2007. He had a stress test in 2012 that showed no ischemia. He is at low risk of cardiac complications related to surgery. He can proceed without further testing.  2. Hypertension, essential: Blood pressure is well controlled on current medical program of losartan, hydrochlorothiazide, and metoprolol succinate  3. Pulsatile tinnitus: Recommend carotid duplex to rule out hemodynamically significant carotid stenosis.  4. Tobacco: Discussed the importance of complete tobacco cessation as it relates to ongoing risk of cardiovascular events.  Tonny Bollman 07/04/2012 10:22 AM

## 2012-07-09 ENCOUNTER — Encounter (INDEPENDENT_AMBULATORY_CARE_PROVIDER_SITE_OTHER): Payer: 59

## 2012-07-09 DIAGNOSIS — R0989 Other specified symptoms and signs involving the circulatory and respiratory systems: Secondary | ICD-10-CM

## 2012-07-09 DIAGNOSIS — I6529 Occlusion and stenosis of unspecified carotid artery: Secondary | ICD-10-CM

## 2012-07-16 ENCOUNTER — Other Ambulatory Visit (HOSPITAL_COMMUNITY): Payer: Self-pay

## 2012-07-16 ENCOUNTER — Other Ambulatory Visit (HOSPITAL_COMMUNITY): Payer: Self-pay | Admitting: Specialist

## 2012-07-29 NOTE — Pre-Procedure Instructions (Signed)
Jeff Lopez  07/29/2012   Your procedure is scheduled on:  Tuesday, February 11th  Report to [facility] at 0930 AM.  Call this number if you have problems the morning of surgery: [phone]   Remember:   Do not eat food or drink liquids after midnight.    Take these medicines the morning of surgery with A SIP OF WATER: Toprol, protonix, oxycodone if needed   Do not wear jewelry, make-up or nail polish.  Do not wear lotions, powders, or perfumes. You may not wear deodorant.  Do not shave 48 hours prior to surgery. Men may shave face and neck.  Do not bring valuables to the hospital.  Contacts, dentures or bridgework may not be worn into surgery.  Leave suitcase in the car. After surgery it may be brought to your room.  For patients admitted to the hospital, checkout time is 11:00 AM the day of  discharge.   Patients discharged the day of surgery will not be allowed to drive  home.   Special Instructions: Shower using CHG 2 nights before surgery and the night before surgery.  If you shower the day of surgery use CHG.  Use special wash - you have one bottle of CHG for all showers.  You should use approximately 1/3 of the bottle for each shower.   Please read over the following fact sheets that you were given: Pain Booklet, Coughing and Deep Breathing, Blood Transfusion Information, MRSA Information and Surgical Site Infection Prevention

## 2012-07-30 ENCOUNTER — Encounter (HOSPITAL_COMMUNITY)
Admission: RE | Admit: 2012-07-30 | Discharge: 2012-07-30 | Disposition: A | Discharge status: Home / Self Care | Payer: 59 | Source: Ambulatory Visit | Attending: Specialist | Admitting: Specialist

## 2012-07-30 ENCOUNTER — Encounter (HOSPITAL_COMMUNITY): Payer: Self-pay

## 2012-07-30 HISTORY — DX: Headache: R51

## 2012-07-30 LAB — CBC
HCT: 51.4 % (ref 39.0–52.0)
MCV: 93.5 fL (ref 78.0–100.0)
Platelets: 191 10*3/uL (ref 150–400)
RBC: 5.5 MIL/uL (ref 4.22–5.81)
WBC: 9.1 10*3/uL (ref 4.0–10.5)

## 2012-07-30 LAB — COMPREHENSIVE METABOLIC PANEL
AST: 18 U/L (ref 0–37)
Alkaline Phosphatase: 101 U/L (ref 39–117)
CO2: 23 mEq/L (ref 19–32)
Chloride: 102 mEq/L (ref 96–112)
Creatinine, Ser: 1.01 mg/dL (ref 0.50–1.35)
GFR calc non Af Amer: 79 mL/min — ABNORMAL LOW (ref >90)
Total Bilirubin: 0.6 mg/dL (ref 0.3–1.2)

## 2012-07-30 LAB — URINALYSIS, ROUTINE W REFLEX MICROSCOPIC
Bilirubin Urine: NEGATIVE
Glucose, UA: NEGATIVE mg/dL
Specific Gravity, Urine: 1.027 (ref 1.005–1.030)
pH: 5 (ref 5.0–8.0)

## 2012-07-30 LAB — TYPE AND SCREEN: Antibody Screen: NEGATIVE

## 2012-07-30 LAB — URINE MICROSCOPIC-ADD ON

## 2012-08-01 NOTE — H&P (Signed)
Jeff Lopez is an 62 y.o. male.   Chief Complaint: back pain and left leg pain HPI: Pt is S/P lumbar fusion L4-S1 for L5-S1 bilateral spondylolysis and DDD with HNP L4-5 in 2012.  He had excellent relief of his leg pain and continued working his regular job until the past 3 months.  Pt developed left leg pain, numbness and weakness without injury.  Pt underwent EMG/NCV indicating severe chronic left L4,L5,S1 radiculopathy. MRI progression of disease at L3-4 with left paracentral HNP.  Continued mild disc bulges at L2-3 with degeneration. With failed conservative treatment and symptoms interfering with ADLs and his job, he opted for surgical intervention for discectomy L3--4 with extension of fusion to include L3-4 and L2-3.   Pt was evaluated by his cardiologist and pulmonologist and cleared for surgical intervention.  Past Medical History  Diagnosis Date  . HYPERLIPIDEMIA 04/09/2007  . HYPERTENSION, BENIGN 04/19/2010  . GERD 04/09/2007  . RENAL CYST 05/27/2010  . OSTEOARTHRITIS, LUMBAR SPINE 04/09/2007  . LUMBAR DISC DISORDER 05/27/2010  . CAD, NATIVE VESSEL 04/19/2010    nonobstructive by cath 2007:  oLAD 20-30%, mLAD 50%, pCFX 20-30%, oAVCFX 20-30%, L renal art 50%;  normal LVF  . Headache     Past Surgical History  Procedure Laterality Date  . Spine surgery  2006    C-spine surgery x 2  . Back surgery    . Neck surgery      Family History  Problem Relation Age of Onset  . Cancer Mother     Breast Cancer, Uterine Cancer  . Breast cancer Mother   . Uterine cancer Mother   . Hypertension Brother   . Heart disease Mother    Social History:  reports that he has quit smoking. His smoking use included Cigarettes. He has a 22 pack-year smoking history. He has never used smokeless tobacco. He reports that he does not drink alcohol or use illicit drugs.  Allergies:  Allergies  Allergen Reactions  . Ceftin Anaphylaxis    Medications Prior to Admission  Medication Sig Dispense  Refill  . losartan-hydrochlorothiazide (HYZAAR) 100-12.5 MG per tablet Take 1 tablet by mouth daily. Back on BP med  30 tablet  11  . metoprolol succinate (TOPROL-XL) 25 MG 24 hr tablet Take 1 tablet (25 mg total) by mouth daily.  30 tablet  11  . Oxycodone HCl 10 MG TABS Take 1 tablet by mouth daily as needed. For pain      . pantoprazole (PROTONIX) 40 MG tablet Take 1 tablet (40 mg total) by mouth 2 (two) times daily before a meal.  60 tablet  11    No results found for this or any previous visit (from the past 48 hour(s)). No results found.  Review of Systems  Constitutional: Positive for malaise/fatigue.       "this pain has worn me down and I have no energy"  HENT: Positive for tinnitus.        Pt with pulsatile tinnitus in the right ear.  Recommendation for carotid duplex by Dr Excell Seltzer.  Not yet done.  Eyes: Negative.   Respiratory: Negative.        Tobacco abuse with dx of COPD by Dr Sherene Sires.  Cardiovascular: Negative.   Gastrointestinal: Negative.   Genitourinary:       Has a cyst on kidney which has been followed for more than 2 years with ultrasound every 6 months.  Musculoskeletal: Positive for myalgias and back pain.  Skin: Negative.  Neurological: Positive for focal weakness.       Left leg  Endo/Heme/Allergies: Negative.     Blood pressure 143/71, pulse 69, temperature 98.3 F (36.8 C), temperature source Oral, resp. rate 18, SpO2 95.00%. Physical Exam  Constitutional: He is oriented to person, place, and time. He appears well-developed and well-nourished.  HENT:  Head: Normocephalic and atraumatic.  Eyes: EOM are normal. Pupils are equal, round, and reactive to light.  Neck:       Mildly decreased ROM secondary to previous cervical fusion  Cardiovascular: Normal rate, regular rhythm, normal heart sounds and intact distal pulses.   Respiratory: Effort normal and breath sounds normal.  GI: Soft. Bowel sounds are normal.  Musculoskeletal:       Antalgic gait with  limping to the left.  +SLR left.  Weakness of left knee extension 5-/5.  Otherwise no focal weakness in lower extremity.  Decrease ROM of lumbar spine.  Well healed surgical wound low back.  No edema of LEs  Neurological: He is alert and oriented to person, place, and time.  Skin: Skin is warm and dry.  Psychiatric: He has a normal mood and affect.     Assessment/Plan HNP L3-4 above L4-S1 fusion. Degenerative disc disease L2-3  PLAN:  Diskectomy and fusion L3-4 with TLIF L2-3 and L3-4 with pedicle screw and rod instrumentation, local bone graft. Patient was seen and examined in the preop holding area. There has been no interval  Change in this patient's exam preop  history and physical exam  Lab tests and images have been examined and reviewed.  The Risks benefits and alternative treatments have been discussed  extensively,questions answered.  The patient has elected to undergo the discussed surgical treatment. Jeff Lopez E 08/06/2012, 7:41 AM

## 2012-08-05 MED ORDER — VANCOMYCIN HCL IN DEXTROSE 1-5 GM/200ML-% IV SOLN
1000.0000 mg | INTRAVENOUS | Status: AC
  Administered 2012-08-06: 1000 mg via INTRAVENOUS
  Filled 2012-08-05: qty 200

## 2012-08-05 NOTE — Progress Notes (Signed)
Patient to arrive at 530

## 2012-08-06 ENCOUNTER — Encounter (HOSPITAL_COMMUNITY): Payer: Self-pay

## 2012-08-06 ENCOUNTER — Inpatient Hospital Stay (HOSPITAL_COMMUNITY)
Admission: RE | Admit: 2012-08-06 | Discharge: 2012-08-13 | DRG: 460 | Disposition: A | Discharge status: Home / Self Care | Payer: 59 | Source: Ambulatory Visit | Attending: Specialist | Admitting: Specialist

## 2012-08-06 ENCOUNTER — Ambulatory Visit (HOSPITAL_COMMUNITY): Payer: 59

## 2012-08-06 ENCOUNTER — Encounter (HOSPITAL_COMMUNITY)
Admission: RE | Disposition: A | Discharge status: Home / Self Care | Payer: Self-pay | Source: Ambulatory Visit | Attending: Specialist

## 2012-08-06 ENCOUNTER — Encounter (HOSPITAL_COMMUNITY): Payer: Self-pay | Admitting: Anesthesiology

## 2012-08-06 ENCOUNTER — Ambulatory Visit (HOSPITAL_COMMUNITY): Payer: 59 | Admitting: Anesthesiology

## 2012-08-06 DIAGNOSIS — M5137 Other intervertebral disc degeneration, lumbosacral region: Secondary | ICD-10-CM | POA: Diagnosis present

## 2012-08-06 DIAGNOSIS — IMO0002 Reserved for concepts with insufficient information to code with codable children: Secondary | ICD-10-CM | POA: Diagnosis not present

## 2012-08-06 DIAGNOSIS — I2510 Atherosclerotic heart disease of native coronary artery without angina pectoris: Secondary | ICD-10-CM | POA: Diagnosis present

## 2012-08-06 DIAGNOSIS — I10 Essential (primary) hypertension: Secondary | ICD-10-CM | POA: Diagnosis present

## 2012-08-06 DIAGNOSIS — M51379 Other intervertebral disc degeneration, lumbosacral region without mention of lumbar back pain or lower extremity pain: Secondary | ICD-10-CM | POA: Diagnosis present

## 2012-08-06 DIAGNOSIS — E876 Hypokalemia: Secondary | ICD-10-CM | POA: Diagnosis not present

## 2012-08-06 DIAGNOSIS — J4489 Other specified chronic obstructive pulmonary disease: Secondary | ICD-10-CM | POA: Diagnosis present

## 2012-08-06 DIAGNOSIS — K219 Gastro-esophageal reflux disease without esophagitis: Secondary | ICD-10-CM | POA: Diagnosis present

## 2012-08-06 DIAGNOSIS — G9782 Other postprocedural complications and disorders of nervous system: Secondary | ICD-10-CM | POA: Diagnosis not present

## 2012-08-06 DIAGNOSIS — E119 Type 2 diabetes mellitus without complications: Secondary | ICD-10-CM | POA: Diagnosis present

## 2012-08-06 DIAGNOSIS — Y9210 Unspecified residential institution as the place of occurrence of the external cause: Secondary | ICD-10-CM | POA: Diagnosis not present

## 2012-08-06 DIAGNOSIS — Z01812 Encounter for preprocedural laboratory examination: Secondary | ICD-10-CM

## 2012-08-06 DIAGNOSIS — F29 Unspecified psychosis not due to a substance or known physiological condition: Secondary | ICD-10-CM | POA: Diagnosis not present

## 2012-08-06 DIAGNOSIS — J449 Chronic obstructive pulmonary disease, unspecified: Secondary | ICD-10-CM | POA: Diagnosis present

## 2012-08-06 DIAGNOSIS — G9600 Cerebrospinal fluid leak, unspecified: Secondary | ICD-10-CM

## 2012-08-06 DIAGNOSIS — G9741 Accidental puncture or laceration of dura during a procedure: Secondary | ICD-10-CM | POA: Diagnosis not present

## 2012-08-06 DIAGNOSIS — M5126 Other intervertebral disc displacement, lumbar region: Principal | ICD-10-CM | POA: Diagnosis present

## 2012-08-06 DIAGNOSIS — M5136 Other intervertebral disc degeneration, lumbar region: Secondary | ICD-10-CM

## 2012-08-06 HISTORY — PX: LUMBAR DISC SURGERY: SHX700

## 2012-08-06 SURGERY — POSTERIOR LUMBAR FUSION 2 LEVEL
Anesthesia: General | Site: Spine Lumbar | Wound class: Clean

## 2012-08-06 MED ORDER — HYDROCODONE-ACETAMINOPHEN 5-325 MG PO TABS
1.0000 | ORAL_TABLET | ORAL | Status: DC | PRN
  Administered 2012-08-08 – 2012-08-09 (×2): 2 via ORAL
  Filled 2012-08-06 (×2): qty 2

## 2012-08-06 MED ORDER — DIPHENHYDRAMINE HCL 12.5 MG/5ML PO ELIX: 12.5000 mg | ORAL_SOLUTION | Freq: Four times a day (QID) | ORAL | Status: DC | PRN

## 2012-08-06 MED ORDER — THROMBIN 20000 UNITS EX SOLR
CUTANEOUS | Status: AC
  Filled 2012-08-06: qty 20000

## 2012-08-06 MED ORDER — OXYCODONE-ACETAMINOPHEN 5-325 MG PO TABS
1.0000 | ORAL_TABLET | ORAL | Status: DC | PRN
  Administered 2012-08-08 – 2012-08-09 (×9): 2 via ORAL
  Filled 2012-08-06 (×9): qty 2

## 2012-08-06 MED ORDER — MORPHINE SULFATE (PF) 1 MG/ML IV SOLN
INTRAVENOUS | Status: AC
  Filled 2012-08-06: qty 25

## 2012-08-06 MED ORDER — SENNOSIDES-DOCUSATE SODIUM 8.6-50 MG PO TABS
1.0000 | ORAL_TABLET | Freq: Every evening | ORAL | Status: DC | PRN
  Administered 2012-08-08: 1 via ORAL
  Filled 2012-08-06: qty 1

## 2012-08-06 MED ORDER — ZOLPIDEM TARTRATE 5 MG PO TABS: 5.0000 mg | ORAL_TABLET | Freq: Every evening | ORAL | Status: DC | PRN

## 2012-08-06 MED ORDER — METOPROLOL SUCCINATE ER 25 MG PO TB24
25.0000 mg | ORAL_TABLET | Freq: Every day | ORAL | Status: DC
  Administered 2012-08-07 – 2012-08-10 (×3): 25 mg via ORAL
  Filled 2012-08-06 (×4): qty 1

## 2012-08-06 MED ORDER — GLYCOPYRROLATE 0.2 MG/ML IJ SOLN
INTRAMUSCULAR | Status: DC | PRN
  Administered 2012-08-06: 0.6 mg via INTRAVENOUS

## 2012-08-06 MED ORDER — SODIUM CHLORIDE 0.9 % IJ SOLN: 3.0000 mL | INTRAMUSCULAR | Status: DC | PRN

## 2012-08-06 MED ORDER — BUPIVACAINE-EPINEPHRINE 0.5% -1:200000 IJ SOLN
INTRAMUSCULAR | Status: DC | PRN
  Administered 2012-08-06: 20 mL

## 2012-08-06 MED ORDER — FENTANYL CITRATE 0.05 MG/ML IJ SOLN: 25.0000 ug | INTRAMUSCULAR | Status: DC | PRN

## 2012-08-06 MED ORDER — SODIUM CHLORIDE 0.9 % IJ SOLN: 3.0000 mL | Freq: Two times a day (BID) | INTRAMUSCULAR | Status: DC

## 2012-08-06 MED ORDER — THROMBIN 20000 UNITS EX SOLR
CUTANEOUS | Status: DC | PRN
  Administered 2012-08-06: 08:00:00 via TOPICAL

## 2012-08-06 MED ORDER — ROCURONIUM BROMIDE 100 MG/10ML IV SOLN
INTRAVENOUS | Status: DC | PRN
  Administered 2012-08-06: 10 mg via INTRAVENOUS
  Administered 2012-08-06: 50 mg via INTRAVENOUS
  Administered 2012-08-06 (×3): 10 mg via INTRAVENOUS

## 2012-08-06 MED ORDER — PROPOFOL 10 MG/ML IV BOLUS
INTRAVENOUS | Status: DC | PRN
  Administered 2012-08-06: 200 mg via INTRAVENOUS

## 2012-08-06 MED ORDER — FLEET ENEMA 7-19 GM/118ML RE ENEM: 1.0000 | ENEMA | Freq: Once | RECTAL | Status: AC | PRN

## 2012-08-06 MED ORDER — NEOSTIGMINE METHYLSULFATE 1 MG/ML IJ SOLN
INTRAMUSCULAR | Status: DC | PRN
  Administered 2012-08-06: 3 mg via INTRAVENOUS

## 2012-08-06 MED ORDER — ONDANSETRON HCL 4 MG/2ML IJ SOLN: 4.0000 mg | INTRAMUSCULAR | Status: DC | PRN

## 2012-08-06 MED ORDER — LOSARTAN POTASSIUM-HCTZ 100-12.5 MG PO TABS: 1.0000 | ORAL_TABLET | Freq: Every day | ORAL | Status: DC

## 2012-08-06 MED ORDER — HYDROMORPHONE HCL PF 1 MG/ML IJ SOLN
INTRAMUSCULAR | Status: AC
  Administered 2012-08-06 (×2): 0.5 mg
  Filled 2012-08-06: qty 2

## 2012-08-06 MED ORDER — DIPHENHYDRAMINE HCL 50 MG/ML IJ SOLN: 12.5000 mg | Freq: Four times a day (QID) | INTRAMUSCULAR | Status: DC | PRN

## 2012-08-06 MED ORDER — METHOCARBAMOL 500 MG PO TABS
500.0000 mg | ORAL_TABLET | Freq: Four times a day (QID) | ORAL | Status: DC | PRN
  Administered 2012-08-08 – 2012-08-09 (×3): 500 mg via ORAL
  Filled 2012-08-06 (×4): qty 1

## 2012-08-06 MED ORDER — DOCUSATE SODIUM 100 MG PO CAPS
100.0000 mg | ORAL_CAPSULE | Freq: Two times a day (BID) | ORAL | Status: DC
  Administered 2012-08-07 – 2012-08-10 (×7): 100 mg via ORAL
  Filled 2012-08-06 (×9): qty 1

## 2012-08-06 MED ORDER — ONDANSETRON HCL 4 MG/2ML IJ SOLN
4.0000 mg | Freq: Four times a day (QID) | INTRAMUSCULAR | Status: DC | PRN
  Administered 2012-08-06 – 2012-08-07 (×2): 4 mg via INTRAVENOUS
  Filled 2012-08-06 (×2): qty 2

## 2012-08-06 MED ORDER — PHENYLEPHRINE HCL 10 MG/ML IJ SOLN
10.0000 mg | INTRAVENOUS | Status: DC | PRN
  Administered 2012-08-06: 15 ug/min via INTRAVENOUS

## 2012-08-06 MED ORDER — MIDAZOLAM HCL 5 MG/5ML IJ SOLN
INTRAMUSCULAR | Status: DC | PRN
  Administered 2012-08-06: 2 mg via INTRAVENOUS

## 2012-08-06 MED ORDER — SODIUM CHLORIDE 0.9 % IV SOLN
INTRAVENOUS | Status: DC | PRN
  Administered 2012-08-06: 13:00:00 via INTRAVENOUS

## 2012-08-06 MED ORDER — VANCOMYCIN HCL 10 G IV SOLR
1250.0000 mg | Freq: Once | INTRAVENOUS | Status: AC
  Administered 2012-08-06: 1250 mg via INTRAVENOUS
  Filled 2012-08-06: qty 1250

## 2012-08-06 MED ORDER — PANTOPRAZOLE SODIUM 40 MG IV SOLR: 40.0000 mg | Freq: Every day | INTRAVENOUS | Status: DC

## 2012-08-06 MED ORDER — PHENOL 1.4 % MT LIQD: 1.0000 | OROMUCOSAL | Status: DC | PRN

## 2012-08-06 MED ORDER — SODIUM CHLORIDE 0.9 % IJ SOLN: 9.0000 mL | INTRAMUSCULAR | Status: DC | PRN

## 2012-08-06 MED ORDER — ACETAMINOPHEN 325 MG PO TABS
650.0000 mg | ORAL_TABLET | ORAL | Status: DC | PRN
  Filled 2012-08-06: qty 2

## 2012-08-06 MED ORDER — NALOXONE HCL 0.4 MG/ML IJ SOLN: 0.4000 mg | INTRAMUSCULAR | Status: DC | PRN

## 2012-08-06 MED ORDER — MORPHINE SULFATE (PF) 1 MG/ML IV SOLN
INTRAVENOUS | Status: DC
  Administered 2012-08-06: 17:00:00 via INTRAVENOUS
  Administered 2012-08-06: 25.5 mg via INTRAVENOUS
  Administered 2012-08-06: 14:00:00 via INTRAVENOUS
  Administered 2012-08-06: 6.85 mg via INTRAVENOUS
  Administered 2012-08-06: 7.5 mg via INTRAVENOUS
  Administered 2012-08-06: 21:00:00 via INTRAVENOUS
  Administered 2012-08-07: 10.5 mg via INTRAVENOUS
  Administered 2012-08-07 (×3): via INTRAVENOUS
  Administered 2012-08-07: 18 mg via INTRAVENOUS
  Administered 2012-08-07: 22.06 mg via INTRAVENOUS
  Administered 2012-08-08: 9 mg via INTRAVENOUS
  Administered 2012-08-08: via INTRAVENOUS
  Filled 2012-08-06 (×6): qty 25

## 2012-08-06 MED ORDER — LOSARTAN POTASSIUM 50 MG PO TABS
100.0000 mg | ORAL_TABLET | Freq: Every day | ORAL | Status: DC
  Administered 2012-08-07 – 2012-08-10 (×3): 100 mg via ORAL
  Filled 2012-08-06 (×4): qty 2

## 2012-08-06 MED ORDER — PROMETHAZINE HCL 25 MG/ML IJ SOLN: 6.2500 mg | INTRAMUSCULAR | Status: DC | PRN

## 2012-08-06 MED ORDER — LACTATED RINGERS IV SOLN
INTRAVENOUS | Status: DC | PRN
  Administered 2012-08-06 (×4): via INTRAVENOUS

## 2012-08-06 MED ORDER — ACETAMINOPHEN 650 MG RE SUPP: 650.0000 mg | RECTAL | Status: DC | PRN

## 2012-08-06 MED ORDER — MIDAZOLAM HCL 2 MG/2ML IJ SOLN: 0.5000 mg | Freq: Once | INTRAMUSCULAR | Status: DC | PRN

## 2012-08-06 MED ORDER — HYDROCHLOROTHIAZIDE 12.5 MG PO CAPS
12.5000 mg | ORAL_CAPSULE | Freq: Every day | ORAL | Status: DC
  Administered 2012-08-07 – 2012-08-10 (×3): 12.5 mg via ORAL
  Filled 2012-08-06 (×4): qty 1

## 2012-08-06 MED ORDER — BISACODYL 10 MG RE SUPP: 10.0000 mg | Freq: Every day | RECTAL | Status: DC | PRN

## 2012-08-06 MED ORDER — MEPERIDINE HCL 25 MG/ML IJ SOLN: 6.2500 mg | INTRAMUSCULAR | Status: DC | PRN

## 2012-08-06 MED ORDER — SODIUM CHLORIDE 0.9 % IV SOLN: 250.0000 mL | INTRAVENOUS | Status: DC

## 2012-08-06 MED ORDER — MORPHINE SULFATE 10 MG/ML IJ SOLN
INTRAMUSCULAR | Status: DC | PRN
  Administered 2012-08-06 (×2): 5 mg via INTRAVENOUS

## 2012-08-06 MED ORDER — 0.9 % SODIUM CHLORIDE (POUR BTL) OPTIME
TOPICAL | Status: DC | PRN
  Administered 2012-08-06: 1000 mL

## 2012-08-06 MED ORDER — MENTHOL 3 MG MT LOZG: 1.0000 | LOZENGE | OROMUCOSAL | Status: DC | PRN

## 2012-08-06 MED ORDER — LIDOCAINE HCL (CARDIAC) 20 MG/ML IV SOLN
INTRAVENOUS | Status: DC | PRN
  Administered 2012-08-06: 75 mg via INTRAVENOUS

## 2012-08-06 MED ORDER — PANTOPRAZOLE SODIUM 40 MG PO TBEC
40.0000 mg | DELAYED_RELEASE_TABLET | Freq: Two times a day (BID) | ORAL | Status: DC
  Administered 2012-08-07 – 2012-08-09 (×3): 40 mg via ORAL
  Filled 2012-08-06 (×5): qty 1

## 2012-08-06 MED ORDER — METHOCARBAMOL 100 MG/ML IJ SOLN
500.0000 mg | Freq: Four times a day (QID) | INTRAVENOUS | Status: DC | PRN
  Filled 2012-08-06: qty 5

## 2012-08-06 MED ORDER — KCL IN DEXTROSE-NACL 20-5-0.45 MEQ/L-%-% IV SOLN
INTRAVENOUS | Status: DC
  Administered 2012-08-06 – 2012-08-07 (×3): via INTRAVENOUS
  Filled 2012-08-06 (×9): qty 1000

## 2012-08-06 MED ORDER — FENTANYL CITRATE 0.05 MG/ML IJ SOLN
INTRAMUSCULAR | Status: DC | PRN
  Administered 2012-08-06: 150 ug via INTRAVENOUS
  Administered 2012-08-06: 100 ug via INTRAVENOUS

## 2012-08-06 MED ORDER — ONDANSETRON HCL 4 MG/2ML IJ SOLN
INTRAMUSCULAR | Status: DC | PRN
  Administered 2012-08-06: 4 mg via INTRAVENOUS

## 2012-08-06 SURGICAL SUPPLY — 73 items
ADH SKN CLS APL DERMABOND .7 (GAUZE/BANDAGES/DRESSINGS)
BLADE SURG ROTATE 9660 (MISCELLANEOUS) IMPLANT
BUR ROUND FLUTED 4 SOFT TCH (BURR) ×2 IMPLANT
CAGE BULLET CONCORDE 9X9X27 (Cage) ×1 IMPLANT
CAGE CONCORDE BULLET 9X9X27 (Cage) ×1 IMPLANT
CLOTH BEACON ORANGE TIMEOUT ST (SAFETY) ×2 IMPLANT
CONNECTOR SFX SIZE A6 (Orthopedic Implant) ×1 IMPLANT
CORDS BIPOLAR (ELECTRODE) ×2 IMPLANT
COVER MAYO STAND STRL (DRAPES) ×4 IMPLANT
COVER SURGICAL LIGHT HANDLE (MISCELLANEOUS) ×2 IMPLANT
DERMABOND ADVANCED (GAUZE/BANDAGES/DRESSINGS)
DERMABOND ADVANCED .7 DNX12 (GAUZE/BANDAGES/DRESSINGS) ×1 IMPLANT
DRAPE C-ARM 42X72 X-RAY (DRAPES) ×3 IMPLANT
DRAPE SURG 17X23 STRL (DRAPES) ×6 IMPLANT
DRAPE TABLE COVER HEAVY DUTY (DRAPES) ×2 IMPLANT
DRSG MEPILEX BORDER 4X4 (GAUZE/BANDAGES/DRESSINGS) IMPLANT
DRSG MEPILEX BORDER 4X8 (GAUZE/BANDAGES/DRESSINGS) ×1 IMPLANT
DURAPREP 26ML APPLICATOR (WOUND CARE) ×2 IMPLANT
DURASEAL SPINE SEALANT 3ML (MISCELLANEOUS) ×1 IMPLANT
ELECT BLADE 6.5 EXT (BLADE) ×1 IMPLANT
ELECT CAUTERY BLADE 6.4 (BLADE) ×2 IMPLANT
ELECT REM PT RETURN 9FT ADLT (ELECTROSURGICAL) ×2
ELECTRODE REM PT RTRN 9FT ADLT (ELECTROSURGICAL) ×1 IMPLANT
EVACUATOR 1/8 PVC DRAIN (DRAIN) IMPLANT
GLOVE BIOGEL PI IND STRL 7.5 (GLOVE) ×1 IMPLANT
GLOVE BIOGEL PI INDICATOR 7.5 (GLOVE) ×1
GLOVE BIOGEL PI ORTHO PRO 7.5 (GLOVE) ×1
GLOVE ECLIPSE 7.0 STRL STRAW (GLOVE) ×2 IMPLANT
GLOVE ECLIPSE 8.5 STRL (GLOVE) ×2 IMPLANT
GLOVE PI ORTHO PRO STRL 7.5 (GLOVE) IMPLANT
GLOVE SURG 8.5 LATEX PF (GLOVE) ×2 IMPLANT
GLOVE SURG SS PI 7.5 STRL IVOR (GLOVE) ×1 IMPLANT
GOWN PREVENTION PLUS LG XLONG (DISPOSABLE) IMPLANT
GOWN PREVENTION PLUS XLARGE (GOWN DISPOSABLE) ×1 IMPLANT
GOWN PREVENTION PLUS XXLARGE (GOWN DISPOSABLE) ×2 IMPLANT
GOWN STRL NON-REIN LRG LVL3 (GOWN DISPOSABLE) ×2 IMPLANT
GOWN STRL REIN XL XLG (GOWN DISPOSABLE) ×1 IMPLANT
HEMOSTAT SURGICEL 2X14 (HEMOSTASIS) IMPLANT
KIT BASIN OR (CUSTOM PROCEDURE TRAY) ×2 IMPLANT
KIT ROOM TURNOVER OR (KITS) ×2 IMPLANT
MARKER SKIN DUAL TIP RULER LAB (MISCELLANEOUS) ×1 IMPLANT
NDL ASP BONE MRW 11GX15 (NEEDLE) IMPLANT
NDL SPNL 18GX3.5 QUINCKE PK (NEEDLE) ×1 IMPLANT
NEEDLE 22X1 1/2 (OR ONLY) (NEEDLE) ×2 IMPLANT
NEEDLE ASP BONE MRW 11GX15 (NEEDLE) IMPLANT
NEEDLE BONE MARROW 8GAX6 (NEEDLE) IMPLANT
NEEDLE SPNL 18GX3.5 QUINCKE PK (NEEDLE) ×2 IMPLANT
NS IRRIG 1000ML POUR BTL (IV SOLUTION) ×2 IMPLANT
PACK LAMINECTOMY ORTHO (CUSTOM PROCEDURE TRAY) ×2 IMPLANT
PAD ARMBOARD 7.5X6 YLW CONV (MISCELLANEOUS) ×3 IMPLANT
PATTIES SURGICAL .75X.75 (GAUZE/BANDAGES/DRESSINGS) IMPLANT
PATTIES SURGICAL 1X1 (DISPOSABLE) ×2 IMPLANT
ROD EXPEDIUM 6.35 300MM (Rod) ×1 IMPLANT
SCREW POLY EXPEDIUM 6.35 5X40 (Screw) ×4 IMPLANT
SCREW SET EXPEDIUM 6.35 (Screw) ×10 IMPLANT
SPONGE LAP 4X18 X RAY DECT (DISPOSABLE) ×2 IMPLANT
SPONGE NEURO XRAY DETECT 1X3 (DISPOSABLE) ×1 IMPLANT
SPONGE SURGIFOAM ABS GEL 100 (HEMOSTASIS) ×2 IMPLANT
SUT NURALON 4 0 TR CR/8 (SUTURE) ×1 IMPLANT
SUT VIC AB 0 CT1 27 (SUTURE) ×2
SUT VIC AB 0 CT1 27XBRD ANBCTR (SUTURE) ×1 IMPLANT
SUT VIC AB 1 CTX 36 (SUTURE) ×4
SUT VIC AB 1 CTX36XBRD ANBCTR (SUTURE) ×2 IMPLANT
SUT VIC AB 2-0 CT1 27 (SUTURE) ×2
SUT VIC AB 2-0 CT1 TAPERPNT 27 (SUTURE) ×1 IMPLANT
SUT VICRYL 0 CT 1 36IN (SUTURE) ×2 IMPLANT
SUT VICRYL 4-0 PS2 18IN ABS (SUTURE) ×4 IMPLANT
SYR CONTROL 10ML LL (SYRINGE) ×4 IMPLANT
TOWEL OR 17X24 6PK STRL BLUE (TOWEL DISPOSABLE) ×2 IMPLANT
TOWEL OR 17X26 10 PK STRL BLUE (TOWEL DISPOSABLE) ×2 IMPLANT
TRAY FOLEY CATH 14FR (SET/KITS/TRAYS/PACK) ×2 IMPLANT
WATER STERILE IRR 1000ML POUR (IV SOLUTION) ×1 IMPLANT
YANKAUER SUCT BULB TIP NO VENT (SUCTIONS) ×2 IMPLANT

## 2012-08-06 NOTE — Anesthesia Procedure Notes (Signed)
Date/Time: 08/06/2012 7:59 AM Performed by: Gwenyth Allegra Pre-anesthesia Checklist: Patient identified, Timeout performed, Emergency Drugs available, Patient being monitored and Suction available Patient Re-evaluated:Patient Re-evaluated prior to inductionOxygen Delivery Method: Circle system utilized Preoxygenation: Pre-oxygenation with 100% oxygen Intubation Type: IV induction Ventilation: Oral airway inserted - appropriate to patient size Laryngoscope Size: Mac and 4 Grade View: Grade I Tube size: 8.0 mm Number of attempts: 1 Airway Equipment and Method: Stylet Secured at: 22 cm Tube secured with: Tape Dental Injury: Teeth and Oropharynx as per pre-operative assessment

## 2012-08-06 NOTE — Anesthesia Postprocedure Evaluation (Signed)
  Anesthesia Post Note  Patient: Jeff Lopez  Procedure(s) Performed: Procedure(s) (LRB): POSTERIOR LUMBAR FUSION 2 LEVEL (N/A)  Anesthesia type: GA  Patient location: PACU  Post pain: Pain level controlled  Post assessment: Post-op Vital signs reviewed  Last Vitals:  Filed Vitals:   08/06/12 1402  BP:   Pulse:   Temp:   Resp: 13    Post vital signs: Reviewed  Level of consciousness: sedated  Complications: No apparent anesthesia complications

## 2012-08-06 NOTE — Op Note (Signed)
08/06/2012  1:28 PM  PATIENT:  Jeff Lopez  62 y.o. male  MRN: [id number]  OPERATIVE REPORT  PRE-OPERATIVE DIAGNOSIS:  L3-4 herniated disc above L4 to S1 Fusion, DDD L2-3  POST-OPERATIVE DIAGNOSIS:  L3-4 herniated disc above L4 to S1 Fusion, DDD L2-3  PROCEDURE:  Procedure(s): POSTERIOR LUMBAR FUSION 2 LEVELDiscectomy Left L3-4 and TLIF Left L2-3 and Left L3-4 with pedicle screws, rods, cages, local bone graft. Posterolateral fusion L2-3 and L3-4, repair of dural tear left L2-3 with 4-0 Neurolon and Duraseal. 5.38mm x 40 mm screws x 4. Removed rods L4-S1 then reinserted L2-S1.    SURGEON:  Kerrin Champagne, MD     ASSISTANT:  Maud Deed, PA-C  (Present throughout the entire procedure and necessary for completion of procedure in a timely manner)     ANESTHESIA:  General,and supplemented with local Marcaine 1/4% with epi 20cc, Dr. Gelene Mink    COMPLICATIONS:  Dural Tear left L2-3.    COMPONENTS: Depuy Expedium 5.72mm x 40 mm screws x 4. Removed rods L4-S1 then reinserted new rods L2-S1 with A-6 Crosslink at L3-4. L4, L5 and S1 pedicle screws retained.  DRAINS: Foley to Straight Drain.  PROCEDURE:The patient was met in the holding area, and the appropriate lumbar level Left L2-3 and L3-4 identified and marked with an x and my initials.The patient was then transported to OR. The patient was then placed under general anesthesia without difficulty.The patient received appropriate preoperative antibiotic prophylaxis ancef. Nursing staff inserted a Foley catheter under sterile conditions. He was then turned to a prone position Caro spine table was used for this case. All pressure points were well padded PAS stocking applied bilateral lower extremity to prevent DVT. Standard prep DuraPrep solution. Draped in the usual manner. Time-out procedure was called and correct .  The old incision scar was ellipsed L2 to S1 and carried superiorly an additional 2 levels to the T12 spinous process and  the incision carried inferiorly an additional one level to the S2 spinous process.  Bovie electric cautery was used to control bleeding and carefully dissection was carried down along the lateral aspects of the spinous process of L1 to S2. Cobb then used to carefully elevate the paralumbar muscle and the incision in the midline was carried to to the level of the base of the residual spinous processes. The laminotomy area extending from the base of the spinous process of L4 to the superior aspect of S1 was carefully exposed and its edges debrided the scar tissue using a large curette. Bilateral L2 and bilateral L3 transverse processes were exposed. The bilateral pedicle screw and rod construct at the L4 to S1 level was also exposed. Viper retractors were placed A Kocher clamp was placed at the L2-3 and at the L3-4 interspinous process levels and intraoperative lateral confirmation of the appropriate level with C-arm sterilely draped. Osteotome was then used to resect the inferior articular process of left L2-3 and ;eft :3-4 a central laminectomy performed at L3 and L4 removing the central portions of the lamina and spinous process of L3 and the residual lamina of the L4 level  using osteotome, currettes and kerrisons removing with 3 mm Kerrison and Penfield 4 and pituitary rongeur. Carefully the medial aspect of the superior articular process of L3 and L4 was freed up of scar tissue off of the adjacent thecal sac. Osteotome used to perform osteotomy of the superior articular process on the left side at L3 and L4 decompressing the lateral recess. This was  done using green stick osteotomy technique and resected using a 3 mm Kerrison and curettes residual bone remaining in the lateral recess was resected using 3mm Kerrisons. Loupe magnification and headlight were used for this portion of the procedure. Residual pars interarticularis overlying the left L2 neuroforamen and L3 neuroforamen was then resected using 2 and 3  mm Kerrisons. The left L2 nerve root well decompressed. Hockey-stick nerve probe was it will be passed out the left L2 neuroforamen and identifying the superior aspect of the L3 pedicle then osteotome used to resect the superior articular process of L3 transversely at the superior level of the L3 pedicle.The left L3 nerve root well decompressed. Hockey-stick nerve probe was it will be passed out the left L3 neuroforamen and identifying the superior aspect of the L4 pedicle then osteotome used to resect the superior articular process of L4 transversely at the superior level of the L4 pedicle.  This allowed for a wide decompression left L2 neuroforamen and the left L3 neuroforamen.  While performing resection of the residual portion of the pars over the left L3 nerve root a dural tear occurred with the kerrison over the left side L2-3. The leak was immediately tamponed with sponges then cottonoids. Exploration of the tear showed a 4-5 mm vertical tear over the left side dorsal dural. There was neural elements visible and a 4-0 neurolon stay sutures were placed at the cranial and caudle aspects of the tear and the neural roots allowed to reduce into the thecal sac. No elements appeared to be severed. The dural tear was then repaired with multiple interrupted 4.0 neurolon sutures Total of 6 sutures. Valsalva to 35 mm hg showed no CSF leak. The fusion and decompression was returned to. With retraction of the L2 nerve root and lateral aspect of the thecal sac at the L2-3 level was then able to be mobilized with Penfield 4 and the disc space at the L2-3 level identified and the nerve structures retracted with Derricho retractor. 15 blade scalpel used to incise the disc the left side. L2-3 discectomy on the left sidecarried out with pituitary ronguers.With retraction of the L3 nerve root and lateral aspect of the thecal sac at the L3-4 level was then able to be mobilized with Penfield 4 and the disc space at the L3-4 level  identified and the nerve structures retracted with Derricho retractor. 15 blade scalpel used to incise the disc the left side. L3-4 discectomy on the left sidecarried out with pituitary ronguers A large subligamentous HNP was found to extend downwards over the posterior aspect of the L4 vertebral body,three large free fragments of disc material were resected.  The left L3 and L4 nerve roots were well decompressed with complete facetectomy over the left L3 nerve root and exposure of the left L4previous foraminotomy.  Following debridement of degenerative disc material from the left side L3-4 disc, the disc space was dilated using 8 mm through 10 mm disc space dilators. The disc space was then prepared using straight curette up-biting right and up-biting left curette and ring curettes. Degenerated disc as well as a cartilage endplates were debrided from the disc place using pituitary rongeurs,currettes. The disc space examined demonstrating good bleeding endplate bone surfaces. Bone graft that was harvested from the facet was placed into the intervertebral disc space after first sounding the disc space for the correct cage size. Cage chosen was a 9 mm Concorde lordotic cage. Local bone graft was then impacted into the intervertebral disc space at L3-4  first placing within the disc space using a forceps then impacted with an 7mm trial cage. After performing this 3 times the permanent 9 mm Concorde lordotic cage packed with local bone graft was then inserted in approximately 15-20 of convergence. Careful inspection of the spinal canal demonstrated no bone graft within the spinal canal both the L3 and L4 nerve roots appeared to exit without further compression. C-arm images were used to confirm the permanent cage implant well positioned within the intervertebral disc this cage was subset deep to the posterior aspect of the disc space by about 3-4 mm. Then debridement of degenerative disc material from the left side L2-3  disc, the disc space was dilated using 8 mm through 10 mm disc space dilators. The disc space was then prepared using straight curette up-biting right and up-biting left curette and ring curettes. Degenerated disc as well as a cartilage endplates were debrided from the disc place using pituitary rongeurs,currettes. The disc space examined demonstrating good bleeding endplate bone surfaces. Bone graft that was harvested from the facet was placed into the intervertebral disc space after first sounding the disc space for the correct cage size. Cage chosen was a 9 mm Concorde parallel cage. Local bone graft was then impacted into the intervertebral disc space at L2-3 first placing within the disc space using a forceps then impacted with an 7 mm trial cage. After performing this 3 times the permanent 9 mm Concorde lordotic cage packed with bone graft was then inserted in approximately 15-20 of convergence. Careful inspection of the spinal canal demonstrated no bone graft within the spinal canal both the L4 and L5 nerve roots appeared to exit without further compression. C-arm images were used to confirm the permanent cage implant well positioned within the intervertebral disc this cage was subset deep to the posterior aspect of the disc space by about 3-4 mm. Attention then turned to performance of insertion of pedicle screw at the L3 level.  Normal was then used to make an entry point into the intersection of the lateral aspect of the L3 pedicle with left L3 transverse process observed on C-arm fluoroscopy to be in good position alignment with the pedicle of L3 observed on lateral view. Handle held pedicle probe was then used to make an opening into the central portions of the pedicle of L3 on the left side. C-arm fluoroscopy used to verify the position alignment of the pedicle probe depth at 40 mm. Decortication of the transverse process of the L3 was then carried out using a high-speed bur. Local bone graft was  applied to the area between the transverse process of L3 and the posterior lateral L4 transverse process. Ball-tipped probe was then used to probe the channel placed on the left side at L3 pedicle again showing no broaching cortex. The 40 mm x 5mm pedicle screw was then inserted at the left L3 level in the appropriate degree of convergence and lordosis. Attention then turned to the left L2 pedicle screw. An entry point into the L2 pedicle identified using the awl at the converging transverse process and lateral aspect of the pedicle of L2. C-arm used to verify the alignment for the L2 pedicle. The pedicle was then probed to a depth of 40 mm using the gearshift pedicle probe. Decortication of the alae was carried out using a high-speed bur and bone graft applied. Check made with ball-tip probe to ensure no penetration of the cortex of the L2 pedicle.Decortication of the transverse process of the  L2 was then carried out using a high-speed bur. Local bone graft was applied to the area between the transverse process of L2 and the posterior lateral L3 transverse process. A 5 mm x 40 mm Depuy expedium screw was then inserted degree of lordosis and in the correct degree of convergence. The C-arm was used to verify the position alignment of pedicle screw. A approximately 130 mm 5mm rod was cut and contoured then carefully inserted into the fasteners extending from L2 to S1 on the left side. The fastener caps at L2 through S1 level was then placed. Attention then turned to the placement of the pedicle screw on the right side at L3 this was done similar to the right side. Awl used to make an initial entry point verified with C-arm fluoroscopy then a hand-held pedicle probe used to probe the pedicle channel, checked with a ball-tip probe and verify no broaching of cortex. Pedicle probe of L3 noted on C-arm fluoroscopy to be in the correct degree of lordosis centered within the pedicle and a length of about 40 mm screw. Tapping  was then performed using is 4.5 mm tap again checking the opening within the pedicle with a pedicle probe to ensure no broaching cortex. 5 mm x 40 mm pedicle screw Depuy Expedium type was then inserted into the right L3 pedicle after first decorticating the transverse process of L3. Local bone graft was then inserted between the transverse process of L3 on the right side to the lateral aspect of the posterior lateral L4 transverse process. L3 Pedicle screw inserted in excellent position alignment observed with C-arm fluoroscopy. Attention then turned to the right L2 pedicle screw placement the L2 transverse process was debrided. An entry point into the L2 pedicle identified using the awl at the convergence of the transverse process and the lateral aspect of the pedicle of L2. C-arm used to verify the alignment for the L2 pedicle screw. The pedicle was then probed to a depth of 40 mm using the gearshift pedicle probe. Decortication of the transverse process was carried out using a high-speed bur and bone graft applied. Check made with ball-tip probe to ensure no penetration of the cortex of the L2 pedicle. A 5 mm x 40  mm expedium screw was then inserted degree of lordosis and in the correct degree of convergence. The C-arm was used to verify the position alignment of pedicle screw. 130 mm rods were then inserted into the fasteners extending from L2-S1 on the right side. Fastener caps then applied to the L2 to S1 pedicle screw fasteners.The left caps were tightened today to 80 foot-pounds compression obtained between the fasteners at L2 and L3 and L3 and L4 and the fastener caps tightened to 80 foot lbs. The fasteners at L5 and S1 were torqued to 80 foot lbs. A transvers loading rod A-6 was placed at the L3-4 level.  Irrigation was carried out using copious amounts of irrigant solution. Thrombin-soaked Gelfoam were placed for hemostasis was then carefully removed. Permanent C-arm images were obtained in AP oblique  and lateral positions for documentation purposes. They showed the pedicle screws rods to be in good position alignment from L2-S1.  The  C-arm was done prior to the insertion on the transverse loading rod. Adequate hemostasis then with Gelfoam thrombin soaked cottonoids these were then removed when hemostasis had been obtained. Duraseal was then applied to the left L2-3 dural repair site. Copious amount of irrigation was then carried out and devitalized tissue debrided. Cell  Saver was used during this case however there was 600 cc blood loss and a total of 350 cc of Cell Saver blood was returned. No Hemovac drain was placed in the lower lumbar spine as the incision was dry and a dural leak had been repaired. neural structures the deep paralumbar muscles with her approximated with 1 Vicryl sutures, the lumbodorsal fascia approximated with 0 and 1 Vicryl sutures. Subcutaneous layers were then approximated with interrupted 0 and 2-0 Vicryl sutures , skin closed with a running subcuticular 4-0 Vicryl suture. The bone was then applied. Mepilex bandage was then applied.  All instrument and sponge counts were correct. Patient was then returned to the supine position reactivated extubated and returned to the recovery room in satisfactory condition.   Mattheus Rauls E  08/06/2012, 1:28 PM

## 2012-08-06 NOTE — Progress Notes (Signed)
Orthopedic Tech Progress Note Patient Details:  Jeff Lopez 1951/01/05 [id number]  Patient ID: Jeff Lopez, male   DOB: September 01, 1950, 62 y.o.   MRN: [id number] Called [employer] with brace order @1530 ; spoke with Jeff Lopez, Jeff Lopez 08/06/2012, 3:32 PM

## 2012-08-06 NOTE — Preoperative (Signed)
Beta Blockers   Reason not to administer Beta Blockers:Not Applicable 

## 2012-08-06 NOTE — H&P (Signed)
Patient was seen and examined in the preop holding area. There has been no interval  Change in this patient's exam preop  history and physical exam  Lab tests and images have been examined and reviewed.  The Risks benefits and alternative treatments have been discussed  extensively,questions answered.  The patient has elected to undergo the discussed surgical treatment. 

## 2012-08-06 NOTE — Brief Op Note (Signed)
08/06/2012  1:14 PM  PATIENT:  Jeff Lopez  61 y.o. male  PRE-OPERATIVE DIAGNOSIS:  L3-4 herniated disc above L4 to S1 Fusion, DDD L2-3  POST-OPERATIVE DIAGNOSIS:  L3-4 herniated disc above L4 to S1 Fusion, DDD L2-3, Dural Tear left L2-3.  PROCEDURE:  Procedure(s) with comments: POSTERIOR LUMBAR FUSION 2 LEVEL (N/A) - Discectomy Left L3-4 and TLIF Left L2-3 and Left L3-4 with pedicle screws, rods, cages, local bone graft. Posterolateral fusion L2-3 and L3-4, repair of dural tear left L2-3 with 4-0 Neurolon and Duraseal. 5.74mm x 40 mm screws x 4. Removed rods L4-S1 then reinserted L2-S1.  SURGEON:  Surgeon(s) and Role:  Kerrin Champagne, MD - Primary  PHYSICIAN ASSISTANT:Sheila Marita [place], PA-C   ANESTHESIA:   general and supplemented with local Marcaine 1/4% with epi 20cc, Dr. Gelene Mink.  EBL:  Total I/O In: 3350 [I.V.:3000; Blood:350] Out: 1200 [Urine:550; Blood:650]  BLOOD ADMINISTERED:350 CC PRBC  DRAINS: Urinary Catheter (Foley)   LOCAL MEDICATIONS USED:  MARCAINE with epinephrine 1/200,000 and Amount: 20 ml  SPECIMEN:  No Specimen  DISPOSITION OF SPECIMEN:  N/A  COUNTS:  YES  DICTATION: .Dragon Dictation  PLAN OF CARE: Admit to inpatient   PATIENT DISPOSITION:  PACU - hemodynamically stable.   Delay start of Pharmacological VTE agent (>24hrs) due to surgical blood loss or risk of bleeding: yes

## 2012-08-06 NOTE — Transfer of Care (Signed)
Immediate Anesthesia Transfer of Care Note  Patient: Jeff Lopez  Procedure(s) Performed: Procedure(s) with comments: POSTERIOR LUMBAR FUSION 2 LEVEL (N/A) - Discectomy L3-4 and TLIF L2-3 and L3-4 with pedicle screws, rods, cages, local bone graft, Chronos graft  Patient Location: PACU  Anesthesia Type:General  Level of Consciousness: awake and alert   Airway & Oxygen Therapy: Patient Spontanous Breathing and Patient connected to nasal cannula oxygen  Post-op Assessment: Report given to PACU RN and Post -op Vital signs reviewed and stable  Post vital signs: Reviewed and stable  Complications: No apparent anesthesia complications

## 2012-08-06 NOTE — Anesthesia Preprocedure Evaluation (Signed)
Anesthesia Evaluation  Patient identified by MRN, date of birth, ID band Patient awake    Reviewed: Allergy & Precautions, H&P , Patient's Chart, lab work & pertinent test results, reviewed documented beta blocker date and time   History of Anesthesia Complications Negative for: history of anesthetic complications  Airway Mallampati: II TM Distance: >3 FB Neck ROM: full    Dental no notable dental hx.    Pulmonary neg pulmonary ROS, COPD breath sounds clear to auscultation  Pulmonary exam normal       Cardiovascular Exercise Tolerance: Good hypertension, + CAD negative cardio ROS  Rhythm:regular Rate:Normal     Neuro/Psych  Headaches, PSYCHIATRIC DISORDERS  Neuromuscular disease negative neurological ROS  negative psych ROS   GI/Hepatic negative GI ROS, Neg liver ROS, GERD-  Controlled,  Endo/Other  negative endocrine ROSdiabetes  Renal/GU Renal diseasenegative Renal ROS     Musculoskeletal   Abdominal   Peds  Hematology negative hematology ROS (+)   Anesthesia Other Findings HYPERLIPIDEMIA 04/09/2007   HYPERTENSION, BENIGN 04/19/2010      GERD 04/09/2007   RENAL CYST 05/27/2010      OSTEOARTHRITIS, LUMBAR SPINE 04/09/2007   LUMBAR DISC DISORDER 05/27/2010      CAD, NATIVE VESSEL 04/19/2010 nonobstructive by cath 2007:  oLAD 20-30%, mLAD 50%, pCFX 20-30%, oAVCFX 20-30%, L renal art 50%;  normal LVF    Reproductive/Obstetrics negative OB ROS                           Anesthesia Physical Anesthesia Plan  ASA: III  Anesthesia Plan: General ETT   Post-op Pain Management:    Induction:   Airway Management Planned:   Additional Equipment:   Intra-op Plan:   Post-operative Plan:   Informed Consent: I have reviewed the patients History and Physical, chart, labs and discussed the procedure including the risks, benefits and alternatives for the proposed anesthesia with the patient or  authorized representative who has indicated his/her understanding and acceptance.   Dental Advisory Given  Plan Discussed with: CRNA and Surgeon  Anesthesia Plan Comments:         Anesthesia Quick Evaluation

## 2012-08-06 NOTE — Progress Notes (Signed)
Utilization review completed. Pauline Pegues, RN, BSN. 

## 2012-08-07 ENCOUNTER — Encounter (HOSPITAL_COMMUNITY): Payer: Self-pay | Admitting: General Practice

## 2012-08-07 LAB — CBC
HCT: 40.2 % (ref 39.0–52.0)
Hemoglobin: 13.7 g/dL (ref 13.0–17.0)
RDW: 12.1 % (ref 11.5–15.5)
WBC: 12.1 10*3/uL — ABNORMAL HIGH (ref 4.0–10.5)

## 2012-08-07 LAB — BASIC METABOLIC PANEL
BUN: 12 mg/dL (ref 6–23)
Chloride: 100 mEq/L (ref 96–112)
GFR calc Af Amer: >90 mL/min (ref >90)
GFR calc non Af Amer: >90 mL/min (ref >90)
Glucose, Bld: 188 mg/dL — ABNORMAL HIGH (ref 70–99)
Potassium: 3.1 mEq/L — ABNORMAL LOW (ref 3.5–5.1)
Sodium: 137 mEq/L (ref 135–145)

## 2012-08-07 MED ORDER — METOCLOPRAMIDE HCL 5 MG/ML IJ SOLN
10.0000 mg | Freq: Four times a day (QID) | INTRAMUSCULAR | Status: AC
  Administered 2012-08-07 – 2012-08-08 (×4): 10 mg via INTRAVENOUS
  Filled 2012-08-07 (×4): qty 2

## 2012-08-07 NOTE — Progress Notes (Signed)
OT Cancellation Note  Patient Details Name: Jeff Lopez MRN: [id number] DOB: October 06, 1950   Cancelled Treatment:    Reason Eval/Treat Not Completed: Medical issues which prohibited therapy  Reason Eval/Treat Not Completed: Medical issues which prohibited therapy (Bedrest until 1517 per order. ) Will f/u another time  Einar Crow D 08/07/2012, 9:41 AM

## 2012-08-07 NOTE — Progress Notes (Signed)
PT Cancellation Note  Patient Details Name: Jeff Lopez MRN: [id number] DOB: 1950-11-09   Cancelled Treatment:    Reason Eval/Treat Not Completed: Medical issues which prohibited therapy (Bedrest until 1517 per order.  )  Will f/u another time.     Danijah Noh, Alison Murray 08/07/2012, 8:27 AM

## 2012-08-07 NOTE — Evaluation (Signed)
Physical Therapy Evaluation Patient Details Name: Jeff Lopez MRN: [id number] DOB: Sep 18, 1950 Today's Date: 08/07/2012 Time: 1841-1910 PT Time Calculation (min): 29 min  PT Assessment / Plan / Recommendation Clinical Impression  Pt is a 62 y/o male s/p posterior lumbar fusion with dural tear repair. Pt mobility mostly limited by pain and generalized weakness.  Acute PT to follow pt in preparation for d/c to home.  No DME needs at this time. PT follow-up to be detrmined.      PT Assessment  Patient needs continued PT services    Follow Up Recommendations  Supervision/Assistance - 24 hour;Home health PT    Does the patient have the potential to tolerate intense rehabilitation      Barriers to Discharge None      Equipment Recommendations  None recommended by PT    Recommendations for Other Services OT consult   Frequency Min 5X/week    Precautions / Restrictions Precautions Precautions: Back Precaution Booklet Issued: Yes (comment) Precaution Comments: Educated pt in 3/3 back precautions and use of brace.  Required Braces or Orthoses: Spinal Brace Spinal Brace: Lumbar corset;Applied in sitting position Restrictions Weight Bearing Restrictions: No   Pertinent Vitals/Pain 8/10 pain in back Pt has PCA for pain management.      Mobility  Bed Mobility Bed Mobility: Rolling Left;Left Sidelying to Sit;Sit to Sidelying Left Rolling Left: 4: Min assist;With rail Left Sidelying to Sit: 2: Max assist;With rails;HOB flat Sit to Sidelying Left: 4: Min assist;HOB flat;With rail Details for Bed Mobility Assistance: VC for log roll technique, assist for bilateral LE management and to raise shoulders from bed secondary to pain.   Transfers Transfers: Sit to Stand;Stand to Sit Sit to Stand: 4: Min assist;From bed;With upper extremity assist Stand to Sit: 4: Min assist;To bed;With upper extremity assist Details for Transfer Assistance: Assist to steady pt secondary to low back  pain and bilateral LE weakness.  Ambulation/Gait Ambulation/Gait Assistance: Not tested (comment)    Exercises     PT Diagnosis: Difficulty walking;Acute pain;Generalized weakness  PT Problem List: Decreased strength;Decreased activity tolerance;Decreased mobility;Decreased knowledge of precautions;Pain PT Treatment Interventions: Gait training;Stair training;Functional mobility training;Therapeutic activities;Patient/family education;DME instruction   PT Goals Acute Rehab PT Goals PT Goal Formulation: With patient Time For Goal Achievement: 08/14/12 Potential to Achieve Goals: Good Pt will Roll Supine to Right Side: with modified independence PT Goal: Rolling Supine to Right Side - Progress: Goal set today Pt will Roll Supine to Left Side: with modified independence PT Goal: Rolling Supine to Left Side - Progress: Goal set today Pt will go Supine/Side to Sit: with modified independence PT Goal: Supine/Side to Sit - Progress: Goal set today Pt will go Sit to Supine/Side: with modified independence PT Goal: Sit to Supine/Side - Progress: Goal set today Pt will go Sit to Stand: with modified independence PT Goal: Sit to Stand - Progress: Goal set today Pt will go Stand to Sit: with modified independence PT Goal: Stand to Sit - Progress: Goal set today Pt will Transfer Bed to Chair/Chair to Bed: with modified independence PT Transfer Goal: Bed to Chair/Chair to Bed - Progress: Goal set today Pt will Ambulate: 51 - 150 feet;with modified independence;with rolling walker PT Goal: Ambulate - Progress: Goal set today Pt will Go Up / Down Stairs: 3-5 stairs;with supervision;with least restrictive assistive device PT Goal: Up/Down Stairs - Progress: Goal set today  Visit Information  Last PT Received On: 08/07/12 Assistance Needed: +1    Subjective Data  Subjective:  Agree to PT eval.  c/o sever headache and nausea.  Patient Stated Goal: Walk without pain. Return to work.    Prior  Functioning  Home Living Lives With: Alone Available Help at Discharge: Family;Available 24 hours/day Type of Home: House Home Access: Stairs to enter [employer] of Steps: 4 Entrance Stairs-Rails: Right;Left;Can reach both Home Layout: One level Bathroom Shower/Tub: Forensic scientist: Standard Home Adaptive Equipment: Walker - rolling;Bedside commode/3-in-1 Prior Function Level of Independence: Independent Able to Take Stairs?: Yes Driving: Yes Vocation: Full time employment Communication Communication: No difficulties    Cognition  Cognition Overall Cognitive Status: Appears within functional limits for tasks assessed/performed Arousal/Alertness: Lethargic Orientation Level: Appears intact for tasks assessed Behavior During Session: [facility] for tasks performed    Extremity/Trunk Assessment Right Upper Extremity Assessment RUE ROM/Strength/Tone: Within functional levels Left Upper Extremity Assessment LUE ROM/Strength/Tone: WFL for tasks assessed Right Lower Extremity Assessment RLE ROM/Strength/Tone: Deficits RLE ROM/Strength/Tone Deficits: bilateral LE weakness 4?5 grossly Left Lower Extremity Assessment LLE ROM/Strength/Tone: Deficits LLE ROM/Strength/Tone Deficits: same as RLE.   Trunk Assessment Trunk Assessment: Normal   Balance Balance Balance Assessed: Yes Static Sitting Balance Static Sitting - Balance Support: Feet supported;Bilateral upper extremity supported Static Sitting - Level of Assistance: 5: Stand by assistance Static Sitting - Comment/# of Minutes: 5+ minuts sitting on EOB attempting to use urinal.  No LOB noted.  Stand by assist secondary to pt c/o nausea and mild dizziness.   Static Standing Balance Static Standing - Balance Support: Bilateral upper extremity supported Static Standing - Level of Assistance: 5: Stand by assistance Static Standing - Comment/# of Minutes: 4+ minutes standing while using urinal with  walker.    End of Session PT - End of Session Equipment Utilized During Treatment: Back brace;Gait belt Activity Tolerance: Patient limited by pain;Patient limited by fatigue Patient left: in bed;with call bell/phone within reach Nurse Communication: Mobility status;Precautions  GP     Siyon Linck 08/07/2012, 7:54 PM Kynlea Blackston L. Glennis Borger DPT [phone]

## 2012-08-07 NOTE — Progress Notes (Signed)
Orthopedic Tech Progress Note Patient Details:  [name] January 05, 1951 [id number]  Patient ID: Jeff Lopez, male   DOB: 05/10/1951, 62 y.o.   MRN: [id number]   Shawnie Pons 08/07/2012, 9:15 AM  PT HAS LUMBAR BRACE IN ROOM.

## 2012-08-07 NOTE — Progress Notes (Signed)
Subjective: 1 Day Post-Op Procedure(s) (LRB): POSTERIOR LUMBAR FUSION 2 LEVEL (N/A) Patient reports pain as mild.  Wants to get up.  Some nausea earlier but no vomiting.  No flatus. Leg pain improved. Mostly pain at incision. Discussed dural tear/repair and he understands need for bedrest x 24hrs. Has mild headache, however he has headaches "daily" and this one doesn't feel any different from his usual headaches for which he uses large amounts of Excedrin.   Objective: Vital signs in last 24 hours: Temp:  [98 F (36.7 C)-98.3 F (36.8 C)] 98.3 F (36.8 C) (02/12 0612) Pulse Rate:  [68-133] 88 (02/12 0612) Resp:  [9-20] 18 (02/12 0612) BP: (100-108)/(53-68) 108/68 mmHg (02/12 0612) SpO2:  [82 %-100 %] 95 % (02/12 0612) Weight:  [83.7 kg (184 lb 8.4 oz)] 83.7 kg (184 lb 8.4 oz) (02/11 1524)  Intake/Output from previous day: 02/11 0701 - 02/12 0700 In: 3350 [I.V.:3000; Blood:350] Out: 1725 [Urine:1075; Blood:650] Intake/Output this shift:     Recent Labs  08/07/12 0555  HGB 13.7    Recent Labs  08/07/12 0555  WBC 12.1*  RBC 4.25  HCT 40.2  PLT 137*    Recent Labs  08/07/12 0555  NA 137  K 3.1*  CL 100  CO2 29  BUN 12  CREATININE 0.84  GLUCOSE 188*  CALCIUM 8.0*   No results found for this basename: LABPT, INR,  in the last 72 hours  Neurovascular intact Sensation intact distally Intact pulses distally Dorsiflexion/Plantar flexion intact Incision: clean, and flat abd distended and decreased bowel sound  Assessment/Plan: 1 Day Post-Op Procedure(s) (LRB): POSTERIOR LUMBAR FUSION 2 LEVEL (N/A) Hold diet today and only sips of clears for meds. Will use Reglan and supp.as pt has early ileus Movement in bed as tolerated and can lift head of bed to position of comfort.  Will start PT later today at around 3 pm for out of bed to chair if he tolerates elevated head of bed without difficulty. Will need LSO for out of bed to chair.  Marg Macmaster M 08/07/2012,  8:07 AM

## 2012-08-08 MED ORDER — ONDANSETRON HCL 4 MG/2ML IJ SOLN
4.0000 mg | Freq: Four times a day (QID) | INTRAMUSCULAR | Status: DC | PRN
  Administered 2012-08-08 – 2012-08-09 (×4): 4 mg via INTRAVENOUS
  Filled 2012-08-08 (×5): qty 2

## 2012-08-08 MED ORDER — ONDANSETRON HCL 4 MG/2ML IJ SOLN
INTRAMUSCULAR | Status: AC
  Administered 2012-08-08: 4 mg
  Filled 2012-08-08: qty 2

## 2012-08-08 MED ORDER — KCL IN DEXTROSE-NACL 20-5-0.45 MEQ/L-%-% IV SOLN
INTRAVENOUS | Status: DC
  Administered 2012-08-09: 13:00:00 via INTRAVENOUS
  Filled 2012-08-08 (×5): qty 1000

## 2012-08-08 NOTE — Progress Notes (Signed)
Physical Therapy Treatment Patient Details Name: Jeff Lopez MRN: [id number] DOB: 08-Nov-1950 Today's Date: 08/08/2012 Time: 7829-5621 PT Time Calculation (min): 25 min  PT Assessment / Plan / Recommendation Comments on Treatment Session  Pt c/o headache whenever he sits up/stands up.  D/c'd gait training secondary to pt c/o nausea.  Pt able to stand at toilet and urinate.  Instructed pt to ambulate to bathroom with nsg from now on instead of using urinal, to build up activity tolerance.  Pt agreed.      Follow Up Recommendations  Home health PT     Does the patient have the potential to tolerate intense rehabilitation     Barriers to Discharge        Equipment Recommendations  None recommended by PT    Recommendations for Other Services OT consult  Frequency Min 5X/week   Plan Discharge plan remains appropriate;Frequency remains appropriate    Precautions / Restrictions Precautions Precautions: Back Precaution Booklet Issued: Yes (comment) Precaution Comments: Educated pt in 3/3 back precautions and use of brace.  Required Braces or Orthoses: Spinal Brace Spinal Brace: Lumbar corset;Applied in sitting position Restrictions Weight Bearing Restrictions: No   Pertinent Vitals/Pain 6-7 /10 pain in back. Pt medicated prior to session.       Mobility  Bed Mobility Bed Mobility: Rolling Left;Left Sidelying to Sit;Sit to Sidelying Left Rolling Left: 5: Supervision Left Sidelying to Sit: 5: Supervision Sit to Sidelying Left: 5: Supervision Details for Bed Mobility Assistance: Vcs for technique Transfers Transfers: Sit to Stand;Stand to Sit Sit to Stand: 5: Supervision Stand to Sit: 5: Supervision Details for Transfer Assistance: cues for hand placement.   Ambulation/Gait Ambulation/Gait Assistance: 5: Supervision Ambulation Distance (Feet): 50 Feet Assistive device: Rolling walker Ambulation/Gait Assistance Details: cues for upright trunk posture.  Gait Pattern:  Within Functional Limits Gait velocity: slow Stairs: No Wheelchair Mobility Wheelchair Mobility: No    Exercises     PT Diagnosis:    PT Problem List:   PT Treatment Interventions:     PT Goals Acute Rehab PT Goals PT Goal Formulation: With patient Time For Goal Achievement: 08/14/12 Potential to Achieve Goals: Good Pt will Roll Supine to Right Side: with modified independence PT Goal: Rolling Supine to Right Side - Progress: Progressing toward goal Pt will Roll Supine to Left Side: with modified independence PT Goal: Rolling Supine to Left Side - Progress: Progressing toward goal Pt will go Supine/Side to Sit: with modified independence PT Goal: Supine/Side to Sit - Progress: Progressing toward goal Pt will go Sit to Supine/Side: with modified independence PT Goal: Sit to Supine/Side - Progress: Progressing toward goal Pt will go Sit to Stand: with modified independence PT Goal: Sit to Stand - Progress: Progressing toward goal Pt will go Stand to Sit: with modified independence PT Goal: Stand to Sit - Progress: Progressing toward goal Pt will Transfer Bed to Chair/Chair to Bed: with modified independence PT Transfer Goal: Bed to Chair/Chair to Bed - Progress: Progressing toward goal Pt will Ambulate: 51 - 150 feet;with modified independence;with rolling walker PT Goal: Ambulate - Progress: Progressing toward goal Pt will Go Up / Down Stairs: 3-5 stairs;with supervision;with least restrictive assistive device PT Goal: Up/Down Stairs - Progress: Progressing toward goal  Visit Information  Last PT Received On: 08/08/12    Subjective Data  Subjective: I get a headache every time I sit up. Patient Stated Goal: Walk without pain. Return to work.    Cognition  Cognition Overall Cognitive Status: Appears  within functional limits for tasks assessed/performed Arousal/Alertness: Awake/alert Orientation Level: Appears intact for tasks assessed Behavior During Session: [facility] for  tasks performed    Balance     End of Session PT - End of Session Equipment Utilized During Treatment: Back brace;Gait belt Activity Tolerance: Patient tolerated treatment well Patient left: in bed;with call bell/phone within reach Nurse Communication: Mobility status;Precautions   GP     Olga Seyler 08/08/2012, 1:50 PM Jynesis Nakamura L. Lurine Imel DPT [phone]

## 2012-08-08 NOTE — Progress Notes (Signed)
Patient examined and lab reviewed with Vernon PA-C. 

## 2012-08-08 NOTE — Progress Notes (Signed)
Order received, chart reviewed, in to speak to pt about role of OT. Per pt he had other back surgery in 2012 and is unfortunately aware of his back precautions, has all DME/AE, and has A prn. He feels he does not need any education on BADLs. Acute OT will sign off.

## 2012-08-08 NOTE — Progress Notes (Signed)
Patient ID: Jeff Lopez, male   DOB: 23-Jun-1951, 62 y.o.   MRN: [id number] Postoperative day 2 lumbar spine fusion. Patient comfortable this morning. Will discontinue her IV and PCA. Plan for physical therapy progressive ambulation.

## 2012-08-08 NOTE — Progress Notes (Signed)
Patient alert and oriented, asked about taking 10a medications.  Patient stated was comfortable just had something for nausea and pain.  Reviewed morning medications with patient to again ask if he would take them.  He stated no, he did not want to get indigestion again.  Reminded him if he changed his mind to let the nurse know.  Call light within reach. Bed rails up.

## 2012-08-09 ENCOUNTER — Inpatient Hospital Stay (HOSPITAL_COMMUNITY): Payer: 59

## 2012-08-09 LAB — CBC WITH DIFFERENTIAL/PLATELET
Basophils Relative: 0 % (ref 0–1)
HCT: 32.8 % — ABNORMAL LOW (ref 39.0–52.0)
Hemoglobin: 11.7 g/dL — ABNORMAL LOW (ref 13.0–17.0)
Lymphocytes Relative: 8 % — ABNORMAL LOW (ref 12–46)
Lymphs Abs: 0.8 10*3/uL (ref 0.7–4.0)
MCHC: 35.7 g/dL (ref 30.0–36.0)
Monocytes Absolute: 0.6 10*3/uL (ref 0.1–1.0)
Monocytes Relative: 6 % (ref 3–12)
Neutro Abs: 9.4 10*3/uL — ABNORMAL HIGH (ref 1.7–7.7)
Neutrophils Relative %: 86 % — ABNORMAL HIGH (ref 43–77)
RBC: 3.6 MIL/uL — ABNORMAL LOW (ref 4.22–5.81)

## 2012-08-09 LAB — BASIC METABOLIC PANEL
BUN: 9 mg/dL (ref 6–23)
CO2: 29 mEq/L (ref 19–32)
Chloride: 99 mEq/L (ref 96–112)
Creatinine, Ser: 0.71 mg/dL (ref 0.50–1.35)
GFR calc Af Amer: >90 mL/min (ref >90)
Glucose, Bld: 178 mg/dL — ABNORMAL HIGH (ref 70–99)
Potassium: 3.1 mEq/L — ABNORMAL LOW (ref 3.5–5.1)

## 2012-08-09 MED ORDER — ONDANSETRON HCL 4 MG/2ML IJ SOLN
4.0000 mg | Freq: Four times a day (QID) | INTRAMUSCULAR | Status: DC | PRN
  Administered 2012-08-11: 4 mg via INTRAVENOUS
  Filled 2012-08-09 (×2): qty 2

## 2012-08-09 MED ORDER — DIPHENHYDRAMINE HCL 12.5 MG/5ML PO ELIX: 12.5000 mg | ORAL_SOLUTION | Freq: Four times a day (QID) | ORAL | Status: DC | PRN

## 2012-08-09 MED ORDER — IOHEXOL 180 MG/ML  SOLN
20.0000 mL | Freq: Once | INTRAMUSCULAR | Status: AC | PRN
  Administered 2012-08-09: 20 mL via INTRATHECAL

## 2012-08-09 MED ORDER — NALOXONE HCL 0.4 MG/ML IJ SOLN: 0.4000 mg | INTRAMUSCULAR | Status: DC | PRN

## 2012-08-09 MED ORDER — DIPHENHYDRAMINE HCL 50 MG/ML IJ SOLN: 12.5000 mg | Freq: Four times a day (QID) | INTRAMUSCULAR | Status: DC | PRN

## 2012-08-09 MED ORDER — VANCOMYCIN HCL IN DEXTROSE 1-5 GM/200ML-% IV SOLN
1000.0000 mg | INTRAVENOUS | Status: AC
  Administered 2012-08-10: 1000 mg via INTRAVENOUS
  Filled 2012-08-09: qty 200

## 2012-08-09 MED ORDER — POTASSIUM CHLORIDE IN NACL 40-0.9 MEQ/L-% IV SOLN
INTRAVENOUS | Status: DC
  Filled 2012-08-09 (×4): qty 1000

## 2012-08-09 MED ORDER — MORPHINE SULFATE (PF) 1 MG/ML IV SOLN
INTRAVENOUS | Status: DC
  Administered 2012-08-09 – 2012-08-10 (×2): via INTRAVENOUS
  Administered 2012-08-10: 21 mg via INTRAVENOUS
  Administered 2012-08-10: 15 mg via INTRAVENOUS
  Administered 2012-08-10: 21 mg via INTRAVENOUS
  Administered 2012-08-10: 02:00:00 via INTRAVENOUS
  Filled 2012-08-09 (×3): qty 25

## 2012-08-09 MED ORDER — ONDANSETRON HCL 4 MG/2ML IJ SOLN
4.0000 mg | Freq: Four times a day (QID) | INTRAMUSCULAR | Status: DC | PRN
  Administered 2012-08-09 – 2012-08-10 (×2): 4 mg via INTRAVENOUS
  Filled 2012-08-09: qty 2

## 2012-08-09 MED ORDER — SODIUM CHLORIDE 0.9 % IJ SOLN: 9.0000 mL | INTRAMUSCULAR | Status: DC | PRN

## 2012-08-09 NOTE — Progress Notes (Signed)
Patient ID: Jeff Lopez, male   DOB: 1950/08/02, 62 y.o.   MRN: [id number] Subjective: 3 Days Post-Op Procedure(s) (LRB): POSTERIOR LUMBAR FUSION 2 LEVEL (N/A) Persisting severe head aches, photophobia, minimal neck stiffness. Patient reports pain as marked.    Objective:   VITALS:  Temp:  [98.1 F (36.7 C)-98.6 F (37 C)] 98.6 F (37 C) (02/14 0654) Pulse Rate:  [83-86] 83 (02/14 0654) Resp:  [18] 18 (02/14 0654) BP: (140-141)/(78-81) 141/78 mmHg (02/14 0654) SpO2:  [95 %-96 %] 96 % (02/14 0654)  Neurologically intact ABD soft Sensation intact distally Intact pulses distally Dorsiflexion/Plantar flexion intact Incision: no drainage Myelogram shows a large dural leak L2-3 left consistent with recurrent dural leak from site of dural tear left L2-3,   LABS  Recent Labs  08/07/12 0555 08/09/12 1100  HGB 13.7 11.7*  WBC 12.1* 10.9*  PLT 137* 144*    Recent Labs  08/07/12 0555 08/09/12 1100  NA 137 135  K 3.1* 3.1*  CL 100 99  CO2 29 29  BUN 12 9  CREATININE 0.84 0.71  GLUCOSE 188* 178*   No results found for this basename: LABPT, INR,  in the last 72 hours   Assessment/Plan: 3 Days Post-Op Procedure(s) (LRB): POSTERIOR LUMBAR FUSION 2 LEVEL (N/A) Dural tear left L2-3 with persistant leak.  Plan: Recommend that he return to the OR for repair of dural leak left L2-3. Likely will use fat graft inaddition to repeat suture and fibrin glue. Risks of surgery including bleeding, infection and nerves discussed with patient and He wishes to proceed. Surgery scheduled as add on to follow 8am to 10am surgical case,  Expect surgery at about 11 am.  NPO past 2 AM. Will replace for low potassium. Evolette Pendell E 08/09/2012, 5:36 PM

## 2012-08-09 NOTE — Progress Notes (Signed)
Myelogram results called to Dr. Otelia Sergeant.  He will follow up with patient.

## 2012-08-09 NOTE — Progress Notes (Signed)
CARE MANAGEMENT NOTE 08/09/2012  Patient:  Jeff Lopez, Jeff Lopez   Account Number:  [id number]  Date Initiated:  08/07/2012  Documentation initiated by:  Vance Peper  Subjective/Objective Assessment:   62 yr old male s/p 2 level lumbar fusion with dural tear.     Action/Plan:   CM will follow. PT has to work with patient.   Comments:  08/09/12 2:56 pm Jeff Lopez BSN Case Manager CM spoke with patient briefly, returning from CT myelogram.Very uncomfortable. Will continue to follow.

## 2012-08-09 NOTE — Progress Notes (Signed)
Patient ID: Jeff Lopez, male   DOB: 10/20/1950, 62 y.o.   MRN: [id number] Subjective: 3 Days Post-Op Procedure(s) (LRB): POSTERIOR LUMBAR FUSION 2 LEVEL (N/A) I'm having severe headaches to the pion of nausea, worse 2 days ago and they semm to be getting better.  Also light bothers me and why do I need nausea medication. Voiding well without difficulty IVF @ 100 cc/hr.  Patient reports pain as marked.  Using po narcotics now.Tolerating liquid diet. Positive flatus.  Objective:   VITALS:  Temp:  [98.1 F (36.7 C)-98.6 F (37 C)] 98.6 F (37 C) (02/14 0654) Pulse Rate:  [81-86] 83 (02/14 0654) Resp:  [18] 18 (02/14 0654) BP: (125-141)/(68-81) 141/78 mmHg (02/14 0654) SpO2:  [93 %-96 %] 96 % (02/14 0654)  Neurologically intact ABD soft Sensation intact distally Intact pulses distally Dorsiflexion/Plantar flexion intact Incision: no drainage and Minimal swelling no fluctuance. No cellulitis present   LABS  Recent Labs  08/07/12 0555  HGB 13.7  WBC 12.1*  PLT 137*    Recent Labs  08/07/12 0555  NA 137  K 3.1*  CL 100  CO2 29  BUN 12  CREATININE 0.84  GLUCOSE 188*   No results found for this basename: LABPT, INR,  in the last 72 hours   Assessment/Plan: 3 Days Post-Op Procedure(s) (LRB): POSTERIOR LUMBAR FUSION 2 LEVEL (N/A) Headaches having spinal quality with photophobia and nausea and orthostatic quality.  Will order myelogram and post myelogram CT Scan to eval for persistant dural leak. Intra op dural tear with Good repair. Persistant complaints of headaches that could represent spinal head ache due to persistant leak. Continue IVF, will order myelogram and keep at bedrest for now. If no leak then continue mobilization. Hold PT until results of Myelogram and post myelogram available.  Delman Goshorn E 08/09/2012, 9:49 AM

## 2012-08-09 NOTE — Progress Notes (Signed)
PT Cancellation Note  Patient Details Name: Jeff Lopez MRN: [id number] DOB: 07/10/1950   Cancelled Treatment:    Reason Eval/Treat Not Completed: Medical issues which prohibited therapy (Possible dural leak per MD. Hold PT until cleared.)   Mana Haberl 08/09/2012, 11:14 AM Mickey Farber. Payten Beaumier DPT [phone]

## 2012-08-10 ENCOUNTER — Encounter (HOSPITAL_COMMUNITY)
Admission: RE | Disposition: A | Discharge status: Home / Self Care | Payer: Self-pay | Source: Ambulatory Visit | Attending: Specialist

## 2012-08-10 ENCOUNTER — Encounter (HOSPITAL_COMMUNITY): Payer: Self-pay | Admitting: Anesthesiology

## 2012-08-10 ENCOUNTER — Encounter (HOSPITAL_COMMUNITY): Payer: Self-pay | Admitting: Specialist

## 2012-08-10 ENCOUNTER — Inpatient Hospital Stay (HOSPITAL_COMMUNITY): Payer: 59 | Admitting: Anesthesiology

## 2012-08-10 DIAGNOSIS — G9600 Cerebrospinal fluid leak, unspecified: Secondary | ICD-10-CM | POA: Diagnosis not present

## 2012-08-10 DIAGNOSIS — G9782 Other postprocedural complications and disorders of nervous system: Secondary | ICD-10-CM | POA: Diagnosis not present

## 2012-08-10 HISTORY — PX: LUMBAR LAMINECTOMY/DECOMPRESSION MICRODISCECTOMY: SHX5026

## 2012-08-10 LAB — BASIC METABOLIC PANEL
BUN: 10 mg/dL (ref 6–23)
CO2: 28 mEq/L (ref 19–32)
Calcium: 8.6 mg/dL (ref 8.4–10.5)
Chloride: 95 mEq/L — ABNORMAL LOW (ref 96–112)
Creatinine, Ser: 0.77 mg/dL (ref 0.50–1.35)
Glucose, Bld: 121 mg/dL — ABNORMAL HIGH (ref 70–99)

## 2012-08-10 LAB — HEMOGLOBIN AND HEMATOCRIT, BLOOD: Hemoglobin: 12.1 g/dL — ABNORMAL LOW (ref 13.0–17.0)

## 2012-08-10 SURGERY — LUMBAR LAMINECTOMY/DECOMPRESSION MICRODISCECTOMY
Anesthesia: General | Site: Back | Laterality: Left | Wound class: Clean

## 2012-08-10 MED ORDER — HYDROMORPHONE HCL PF 1 MG/ML IJ SOLN
INTRAMUSCULAR | Status: AC
  Filled 2012-08-10: qty 2

## 2012-08-10 MED ORDER — ONDANSETRON HCL 4 MG/2ML IJ SOLN
4.0000 mg | INTRAMUSCULAR | Status: DC | PRN
  Administered 2012-08-10 – 2012-08-11 (×2): 4 mg via INTRAVENOUS
  Filled 2012-08-10: qty 2

## 2012-08-10 MED ORDER — LIDOCAINE HCL (CARDIAC) 20 MG/ML IV SOLN
INTRAVENOUS | Status: DC | PRN
  Administered 2012-08-10: 40 mg via INTRAVENOUS

## 2012-08-10 MED ORDER — GLYCOPYRROLATE 0.2 MG/ML IJ SOLN
INTRAMUSCULAR | Status: DC | PRN
  Administered 2012-08-10: .4 mg via INTRAVENOUS

## 2012-08-10 MED ORDER — POTASSIUM CHLORIDE IN NACL 40-0.9 MEQ/L-% IV SOLN
INTRAVENOUS | Status: DC
  Administered 2012-08-10: 21:00:00 via INTRAVENOUS
  Administered 2012-08-11: 50 mL/h via INTRAVENOUS
  Filled 2012-08-10 (×5): qty 1000

## 2012-08-10 MED ORDER — SODIUM CHLORIDE 0.9 % IV SOLN: 250.0000 mL | INTRAVENOUS | Status: DC

## 2012-08-10 MED ORDER — HYDROCODONE-ACETAMINOPHEN 5-325 MG PO TABS
1.0000 | ORAL_TABLET | ORAL | Status: DC | PRN
  Administered 2012-08-12 – 2012-08-13 (×6): 2 via ORAL
  Filled 2012-08-10 (×7): qty 2

## 2012-08-10 MED ORDER — OXYCODONE HCL 5 MG PO TABS: 5.0000 mg | ORAL_TABLET | Freq: Once | ORAL | Status: DC | PRN

## 2012-08-10 MED ORDER — SUCCINYLCHOLINE CHLORIDE 20 MG/ML IJ SOLN
INTRAMUSCULAR | Status: DC | PRN
  Administered 2012-08-10: 100 mg via INTRAVENOUS

## 2012-08-10 MED ORDER — ROCURONIUM BROMIDE 100 MG/10ML IV SOLN
INTRAVENOUS | Status: DC | PRN
  Administered 2012-08-10: 10 mg via INTRAVENOUS

## 2012-08-10 MED ORDER — HYDROMORPHONE HCL PF 1 MG/ML IJ SOLN
0.2500 mg | INTRAMUSCULAR | Status: DC | PRN
  Administered 2012-08-10 (×2): 0.5 mg via INTRAVENOUS

## 2012-08-10 MED ORDER — THROMBIN 20000 UNITS EX SOLR
CUTANEOUS | Status: AC
  Filled 2012-08-10: qty 20000

## 2012-08-10 MED ORDER — MAGNESIUM CITRATE PO SOLN: 1.0000 | Freq: Once | ORAL | Status: AC | PRN

## 2012-08-10 MED ORDER — VANCOMYCIN HCL 10 G IV SOLR
1500.0000 mg | Freq: Once | INTRAVENOUS | Status: AC
  Administered 2012-08-10: 1500 mg via INTRAVENOUS
  Filled 2012-08-10: qty 1500

## 2012-08-10 MED ORDER — OXYCODONE HCL 5 MG/5ML PO SOLN: 5.0000 mg | Freq: Once | ORAL | Status: DC | PRN

## 2012-08-10 MED ORDER — MIDAZOLAM HCL 5 MG/5ML IJ SOLN
INTRAMUSCULAR | Status: DC | PRN
  Administered 2012-08-10: 2 mg via INTRAVENOUS

## 2012-08-10 MED ORDER — 0.9 % SODIUM CHLORIDE (POUR BTL) OPTIME
TOPICAL | Status: DC | PRN
  Administered 2012-08-10: 1000 mL

## 2012-08-10 MED ORDER — DEXTROSE 5 % IV SOLN
500.0000 mg | Freq: Four times a day (QID) | INTRAVENOUS | Status: DC | PRN
  Filled 2012-08-10: qty 5

## 2012-08-10 MED ORDER — POTASSIUM CHLORIDE 10 MEQ/100ML IV SOLN
10.0000 meq | INTRAVENOUS | Status: DC
  Filled 2012-08-10 (×2): qty 100

## 2012-08-10 MED ORDER — OXYCODONE-ACETAMINOPHEN 5-325 MG PO TABS
1.0000 | ORAL_TABLET | ORAL | Status: DC | PRN
  Administered 2012-08-10 – 2012-08-12 (×10): 2 via ORAL
  Filled 2012-08-10 (×10): qty 2

## 2012-08-10 MED ORDER — SODIUM CHLORIDE 0.9 % IV SOLN
INTRAVENOUS | Status: DC | PRN
  Administered 2012-08-10: 13:00:00 via INTRAVENOUS

## 2012-08-10 MED ORDER — SODIUM CHLORIDE 0.9 % IJ SOLN: 3.0000 mL | INTRAMUSCULAR | Status: DC | PRN

## 2012-08-10 MED ORDER — PROPOFOL 10 MG/ML IV BOLUS
INTRAVENOUS | Status: DC | PRN
  Administered 2012-08-10: 120 mg via INTRAVENOUS

## 2012-08-10 MED ORDER — LACTATED RINGERS IV SOLN
INTRAVENOUS | Status: DC | PRN
  Administered 2012-08-10 (×2): via INTRAVENOUS

## 2012-08-10 MED ORDER — ZOLPIDEM TARTRATE 5 MG PO TABS
5.0000 mg | ORAL_TABLET | Freq: Every evening | ORAL | Status: DC | PRN
  Administered 2012-08-13 (×2): 5 mg via ORAL
  Filled 2012-08-10 (×2): qty 1

## 2012-08-10 MED ORDER — FENTANYL CITRATE 0.05 MG/ML IJ SOLN
INTRAMUSCULAR | Status: DC | PRN
  Administered 2012-08-10 (×3): 50 ug via INTRAVENOUS

## 2012-08-10 MED ORDER — KETOROLAC TROMETHAMINE 30 MG/ML IJ SOLN
30.0000 mg | Freq: Four times a day (QID) | INTRAMUSCULAR | Status: DC
  Administered 2012-08-10 – 2012-08-12 (×6): 30 mg via INTRAVENOUS
  Filled 2012-08-10 (×10): qty 1

## 2012-08-10 MED ORDER — THROMBIN 20000 UNITS EX SOLR
CUTANEOUS | Status: DC | PRN
  Administered 2012-08-10: 13:00:00 via TOPICAL

## 2012-08-10 MED ORDER — HYDROMORPHONE HCL PF 1 MG/ML IJ SOLN
0.5000 mg | INTRAMUSCULAR | Status: DC | PRN
  Administered 2012-08-10: 1 mg via INTRAVENOUS
  Filled 2012-08-10 (×2): qty 1

## 2012-08-10 MED ORDER — ONDANSETRON HCL 4 MG/2ML IJ SOLN
INTRAMUSCULAR | Status: DC | PRN
  Administered 2012-08-10: 4 mg via INTRAVENOUS

## 2012-08-10 MED ORDER — NEOSTIGMINE METHYLSULFATE 1 MG/ML IJ SOLN
INTRAMUSCULAR | Status: DC | PRN
  Administered 2012-08-10: 3 mg via INTRAVENOUS

## 2012-08-10 MED ORDER — POLYETHYLENE GLYCOL 3350 17 G PO PACK: 17.0000 g | PACK | Freq: Every day | ORAL | Status: DC | PRN

## 2012-08-10 MED ORDER — ALUM & MAG HYDROXIDE-SIMETH 200-200-20 MG/5ML PO SUSP
30.0000 mL | Freq: Four times a day (QID) | ORAL | Status: DC | PRN
  Administered 2012-08-11: 30 mL via ORAL
  Filled 2012-08-10: qty 30

## 2012-08-10 MED ORDER — SODIUM CHLORIDE 0.9 % IJ SOLN
3.0000 mL | Freq: Two times a day (BID) | INTRAMUSCULAR | Status: DC
  Administered 2012-08-10: 3 mL via INTRAVENOUS

## 2012-08-10 MED ORDER — METHOCARBAMOL 500 MG PO TABS
500.0000 mg | ORAL_TABLET | Freq: Four times a day (QID) | ORAL | Status: DC | PRN
  Administered 2012-08-11 – 2012-08-13 (×7): 500 mg via ORAL
  Filled 2012-08-10 (×7): qty 1

## 2012-08-10 MED ORDER — SORBITOL 70 % SOLN: 30.0000 mL | Freq: Every day | Status: DC | PRN

## 2012-08-10 MED ORDER — MENTHOL 3 MG MT LOZG: 1.0000 | LOZENGE | OROMUCOSAL | Status: DC | PRN

## 2012-08-10 MED ORDER — LACTATED RINGERS IV SOLN
INTRAVENOUS | Status: DC | PRN
  Administered 2012-08-10: 13:00:00 via INTRAVENOUS

## 2012-08-10 MED ORDER — POTASSIUM CHLORIDE 10 MEQ/100ML IV SOLN
INTRAVENOUS | Status: DC | PRN
  Administered 2012-08-10 (×2): 10 meq via INTRAVENOUS

## 2012-08-10 MED ORDER — PHENOL 1.4 % MT LIQD: 1.0000 | OROMUCOSAL | Status: DC | PRN

## 2012-08-10 MED ORDER — PHENYLEPHRINE HCL 10 MG/ML IJ SOLN
INTRAMUSCULAR | Status: DC | PRN
  Administered 2012-08-10: 80 ug via INTRAVENOUS

## 2012-08-10 MED ORDER — BUPIVACAINE-EPINEPHRINE (PF) 0.5% -1:200000 IJ SOLN
INTRAMUSCULAR | Status: AC
  Filled 2012-08-10: qty 10

## 2012-08-10 MED ORDER — DOCUSATE SODIUM 100 MG PO CAPS
100.0000 mg | ORAL_CAPSULE | Freq: Two times a day (BID) | ORAL | Status: DC
  Administered 2012-08-10 – 2012-08-11 (×2): 100 mg via ORAL
  Filled 2012-08-10 (×6): qty 1

## 2012-08-10 SURGICAL SUPPLY — 69 items
ADH SKN CLS APL DERMABOND .7 (GAUZE/BANDAGES/DRESSINGS)
AIRSTRIP 3X4 (GAUZE/BANDAGES/DRESSINGS) ×1 IMPLANT
BUR ROUND FLUTED 4 SOFT TCH (BURR) IMPLANT
CANISTER SUCTION 2500CC (MISCELLANEOUS) ×1 IMPLANT
CLOTH BEACON ORANGE TIMEOUT ST (SAFETY) ×2 IMPLANT
CORDS BIPOLAR (ELECTRODE) ×2 IMPLANT
DERMABOND ADVANCED (GAUZE/BANDAGES/DRESSINGS)
DERMABOND ADVANCED .7 DNX12 (GAUZE/BANDAGES/DRESSINGS) ×1 IMPLANT
DRAPE INCISE IOBAN 66X45 STRL (DRAPES) ×1 IMPLANT
DRAPE MICROSCOPE LEICA (MISCELLANEOUS) ×3 IMPLANT
DRAPE POUCH INSTRU U-SHP 10X18 (DRAPES) ×1 IMPLANT
DRAPE PROXIMA HALF (DRAPES) IMPLANT
DRAPE SURG 17X23 STRL (DRAPES) ×8 IMPLANT
DRSG MEPILEX BORDER 4X12 (GAUZE/BANDAGES/DRESSINGS) ×1 IMPLANT
DRSG MEPILEX BORDER 4X4 (GAUZE/BANDAGES/DRESSINGS) IMPLANT
DRSG MEPILEX BORDER 4X8 (GAUZE/BANDAGES/DRESSINGS) IMPLANT
DURAFORM COLLAGEN 1X1 5-PACK (Neuro Prosthesis/Implant) ×1 IMPLANT
DURAPREP 26ML APPLICATOR (WOUND CARE) ×1 IMPLANT
DURASEAL SPINE SEALANT 3ML (MISCELLANEOUS) ×1 IMPLANT
ELECT CAUTERY BLADE 6.4 (BLADE) ×2 IMPLANT
ELECT REM PT RETURN 9FT ADLT (ELECTROSURGICAL) ×2
ELECTRODE REM PT RTRN 9FT ADLT (ELECTROSURGICAL) ×1 IMPLANT
ERX103634 IMPLANT
EVACUATOR 1/8 PVC DRAIN (DRAIN) IMPLANT
GLOVE BIO SURGEON STRL SZ 6.5 (GLOVE) ×1 IMPLANT
GLOVE BIOGEL PI IND STRL 7.5 (GLOVE) ×1 IMPLANT
GLOVE BIOGEL PI INDICATOR 7.5 (GLOVE) ×1
GLOVE ECLIPSE 7.0 STRL STRAW (GLOVE) ×2 IMPLANT
GLOVE ECLIPSE 8.5 STRL (GLOVE) ×2 IMPLANT
GLOVE NEODERM STER SZ 7 (GLOVE) ×1 IMPLANT
GLOVE SURG 8.5 LATEX PF (GLOVE) ×2 IMPLANT
GOWN PREVENTION PLUS LG XLONG (DISPOSABLE) IMPLANT
GOWN PREVENTION PLUS XXLARGE (GOWN DISPOSABLE) ×2 IMPLANT
GOWN STRL NON-REIN LRG LVL3 (GOWN DISPOSABLE) ×5 IMPLANT
KIT BASIN OR (CUSTOM PROCEDURE TRAY) ×2 IMPLANT
KIT ROOM TURNOVER OR (KITS) ×2 IMPLANT
MANIFOLD NEPTUNE II (INSTRUMENTS) ×1 IMPLANT
NDL SPNL 18GX3.5 QUINCKE PK (NEEDLE) ×2 IMPLANT
NEEDLE 22X1 1/2 (OR ONLY) (NEEDLE) ×2 IMPLANT
NEEDLE SPNL 18GX3.5 QUINCKE PK (NEEDLE) IMPLANT
NS IRRIG 1000ML POUR BTL (IV SOLUTION) ×2 IMPLANT
PACK LAMINECTOMY ORTHO (CUSTOM PROCEDURE TRAY) ×2 IMPLANT
PAD ARMBOARD 7.5X6 YLW CONV (MISCELLANEOUS) ×4 IMPLANT
PATTIES SURGICAL .5 X.5 (GAUZE/BANDAGES/DRESSINGS) IMPLANT
PATTIES SURGICAL .75X.75 (GAUZE/BANDAGES/DRESSINGS) ×1 IMPLANT
PATTIES SURGICAL 1X1 (DISPOSABLE) IMPLANT
SPECIMEN JAR SMALL (MISCELLANEOUS) ×2 IMPLANT
SPONGE LAP 4X18 X RAY DECT (DISPOSABLE) IMPLANT
SPONGE SURGIFOAM ABS GEL 100 (HEMOSTASIS) ×1 IMPLANT
SUT ETHILON 3 0 PS 1 (SUTURE) ×2 IMPLANT
SUT NURALON 4 0 TR CR/8 (SUTURE) ×1 IMPLANT
SUT VIC AB 0 CT1 27 (SUTURE) ×4
SUT VIC AB 0 CT1 27XBRD ANBCTR (SUTURE) ×2 IMPLANT
SUT VIC AB 1 CT1 27 (SUTURE)
SUT VIC AB 1 CT1 27XBRD ANBCTR (SUTURE) ×2 IMPLANT
SUT VIC AB 1 CTX 36 (SUTURE) ×4
SUT VIC AB 1 CTX36XBRD ANBCTR (SUTURE) IMPLANT
SUT VIC AB 2-0 CT1 27 (SUTURE) ×2
SUT VIC AB 2-0 CT1 TAPERPNT 27 (SUTURE) IMPLANT
SUT VICRYL 0 UR6 27IN ABS (SUTURE) ×1 IMPLANT
SUT VICRYL 4-0 PS2 18IN ABS (SUTURE) IMPLANT
SWAB COLLECTION DEVICE MRSA (MISCELLANEOUS) ×1 IMPLANT
SYR CONTROL 10ML LL (SYRINGE) ×2 IMPLANT
TOWEL OR 17X24 6PK STRL BLUE (TOWEL DISPOSABLE) ×2 IMPLANT
TOWEL OR 17X26 10 PK STRL BLUE (TOWEL DISPOSABLE) ×2 IMPLANT
TRAY FOLEY CATH 14FR (SET/KITS/TRAYS/PACK) IMPLANT
TUBE ANAEROBIC SPECIMEN COL (MISCELLANEOUS) ×1 IMPLANT
WATER STERILE IRR 1000ML POUR (IV SOLUTION) ×1 IMPLANT
YANKAUER SUCT BULB TIP NO VENT (SUCTIONS) ×1 IMPLANT

## 2012-08-10 NOTE — Anesthesia Postprocedure Evaluation (Signed)
  Anesthesia Post-op Note  Patient: Jeff Lopez  Procedure(s) Performed: Procedure(s) with comments: Dura Repair Left Side L2-L3 (Left) - Wilson Frame, Sliding table, dura repair kit, microscope  Patient Location: PACU  Anesthesia Type:General  Level of Consciousness: awake  Airway and Oxygen Therapy: Patient Spontanous Breathing and Patient connected to nasal cannula oxygen  Post-op Pain: mild  Post-op Assessment: Post-op Vital signs reviewed, Patient's Cardiovascular Status Stable, Respiratory Function Stable, Patent Airway and No signs of Nausea or vomiting  Post-op Vital Signs: Reviewed and stable  Complications: No apparent anesthesia complications

## 2012-08-10 NOTE — Anesthesia Preprocedure Evaluation (Addendum)
Anesthesia Evaluation  Patient identified by MRN, date of birth, ID band Patient awake    Reviewed: Allergy & Precautions, H&P , NPO status , Patient's Chart, lab work & pertinent test results, reviewed documented beta blocker date and time   Airway Mallampati: II TM Distance: >3 FB Neck ROM: Full    Dental no notable dental hx. (+) Teeth Intact and Dental Advisory Given   Pulmonary COPD breath sounds clear to auscultation  Pulmonary exam normal       Cardiovascular hypertension, On Medications and On Home Beta Blockers + CAD negative cardio ROS  Rhythm:Regular Rate:Normal     Neuro/Psych  Headaches, negative psych ROS   GI/Hepatic Neg liver ROS, GERD-  Medicated and Controlled,  Endo/Other  negative endocrine ROSneg diabetes  Renal/GU negative Renal ROS  negative genitourinary   Musculoskeletal   Abdominal   Peds  Hematology negative hematology ROS (+)   Anesthesia Other Findings   Reproductive/Obstetrics negative OB ROS                           Anesthesia Physical Anesthesia Plan  ASA: III  Anesthesia Plan: General   Post-op Pain Management:    Induction: Intravenous  Airway Management Planned: Oral ETT  Additional Equipment:   Intra-op Plan:   Post-operative Plan: Extubation in OR  Informed Consent: I have reviewed the patients History and Physical, chart, labs and discussed the procedure including the risks, benefits and alternatives for the proposed anesthesia with the patient or authorized representative who has indicated his/her understanding and acceptance.   Dental advisory given  Plan Discussed with: CRNA, Anesthesiologist and Surgeon  Anesthesia Plan Comments:        Anesthesia Quick Evaluation

## 2012-08-10 NOTE — Transfer of Care (Signed)
Immediate Anesthesia Transfer of Care Note  Patient: [name]  Procedure(s) Performed: Procedure(s) with comments: Dura Repair Left Side L2-L3 (Left) - Wilson Frame, Sliding table, dura repair kit, microscope  Patient Location: PACU  Anesthesia Type:General  Level of Consciousness: responds to stimulation  Airway & Oxygen Therapy: Patient Spontanous Breathing and Patient connected to nasal cannula oxygen  Post-op Assessment: Report given to PACU RN, Post -op Vital signs reviewed and stable, Patient moving all extremities and Patient moving all extremities X 4  Post vital signs: Reviewed and stable  Complications: No apparent anesthesia complications

## 2012-08-10 NOTE — Progress Notes (Signed)
PT Cancellation Note  Patient Details Name: Jeff Lopez MRN: [id number] DOB: 02/01/1951   Cancelled Treatment:    Reason Eval/Treat Not Completed: Medical issues which prohibited therapy;Other (comment) (Patient schedule for OR to repair dural leak. ) Will await orders for new eval after surgery.    Thanks 08/10/2012 Fredrich Birks PTA [phone] pager [phone] office      Fredrich Birks 08/10/2012, 7:21 AM

## 2012-08-10 NOTE — H&P (Signed)
Risks and benefits of undergoing repair of dural leak discussed with this patient. Including infection, persistent leak, bleeding and neurologic compromise.

## 2012-08-10 NOTE — Preoperative (Addendum)
Beta Blockers   Reason not to administer Beta Blockers:Metoprolol 25 MG PO @0939  HR. On 08/10/2012

## 2012-08-10 NOTE — Anesthesia Procedure Notes (Signed)
Procedure Name: Intubation Date/Time: 08/10/2012 12:29 PM Performed by: Brien Mates DOBSON Pre-anesthesia Checklist: Patient identified, Emergency Drugs available, Suction available, Patient being monitored and Timeout performed Patient Re-evaluated:Patient Re-evaluated prior to inductionOxygen Delivery Method: Circle system utilized Preoxygenation: Pre-oxygenation with 100% oxygen Intubation Type: IV induction Ventilation: Mask ventilation without difficulty Laryngoscope Size: Miller and 2 Grade View: Grade I Tube type: Oral Tube size: 7.5 mm Number of attempts: 1 Airway Equipment and Method: Stylet Placement Confirmation: ETT inserted through vocal cords under direct vision,  positive ETCO2 and breath sounds checked- equal and bilateral Secured at: 22 cm Tube secured with: Tape Dental Injury: Teeth and Oropharynx as per pre-operative assessment

## 2012-08-10 NOTE — Brief Op Note (Signed)
08/06/2012 - 08/10/2012  3:21 PM  PATIENT:  Jeff Lopez  62 y.o. male  PRE-OPERATIVE DIAGNOSIS:  Dura Leak  POST-OPERATIVE DIAGNOSIS:  Dura Leak  PROCEDURE:  Procedure(s) with comments: Dura Repair Left Side L2-L3 (Left) with 4-0 neurolon, durafoam and duraseal.microscope.  SURGEON:  Surgeon(s) and Role:  Kerrin Champagne, MD - Primary  ANESTHESIA:   general Dr. Sampson Goon.  EBL:  Total I/O In: 1800 [I.V.:1800] Out: -   BLOOD ADMINISTERED:none  DRAINS: none   LOCAL MEDICATIONS USED:  NONE  SPECIMEN:  Source of Specimen:  Swab of fluid from incision site. Aerobe and anaerobic C&S.  DISPOSITION OF SPECIMEN:  Microbiology  COUNTS:  YES  TOURNIQUET:  * No tourniquets in log *  DICTATION: .Dragon Dictation  PLAN OF CARE: Admit to inpatient   PATIENT DISPOSITION:  PACU - hemodynamically stable.   Delay start of Pharmacological VTE agent (>24hrs) due to surgical blood loss or risk of bleeding: yes

## 2012-08-10 NOTE — Op Note (Signed)
08/06/2012 - 08/10/2012  3:41 PM  PATIENT:  Jeff Lopez  62 y.o. male  MRN: [id number]  OPERATIVE REPORT  PRE-OPERATIVE DIAGNOSIS:  Dural Leak left L2-3  POST-OPERATIVE DIAGNOSIS:  Dural Leak, left L2-3  PROCEDURE:  Procedure(s): Dura Repair Left Side L2-L3 with 4-0 neurolon, durafoam and duraseal.    SURGEON:  Kerrin Champagne, MD     AANESTHESIA:  Lebron Conners.    COMPLICATIONS:  Dural leak.    PROCEDURE: The patient was met in the holding area, and the appropriate left L2-3 identified and marked with an "X" and my initials.The patient was then transported to OR. The patient was then placed under  general anesthesia without difficulty and was placed on the operative table in a prone position. The patient received appropriate preoperative antibiotic prophylaxis vancomycin. PAS both lower extremities intraoperatively.   Time-out procedure was called and correct. Standard prep with Betadine scrub and Betadine solution following removal of Dermabond using forceps and adhesive remover. Draped in the usual manner dry and iodine Vi-Drape applied. Following timeout then the incision was opened using a 10 blade scalpel removing suture material at the subcutaneous layer and fascial layers including a running 4-0 Vicryl, 2-0 Vicryl sutures 0 Vicryl sutures and #1 Vicryl sutures. Great deal of clear thin CSF-appearing fluid was found immediately upon opening the skin and subcutaneous. Swabs were taken for culture and sensitivity anaerobic and aerobic. Self-retaining retractors were placed and the incision irrigated with copious amounts of irrigant solution normal saline. The operating room microscope was draped sterilely and brought into the field. Initial exposure was performed using loupe magnification and head lamp. OR microscope was sterilely draped. Under the OR microscope then evaluation of the area of previous left side L2-3 dural tear was examined. Suture line showed the third from  the most cranial suture pulled through the dura laterally. A steady stream of cerebrospinal fluid leakage noted. This was approximated side to side with a single 4-0 Nurolon suture after removal of the previous old suture. Carefully the medial aspect of the L2 and L3 pedicles were examined and demonstrated no broaching of cortex or sign of dural abnormality. Reevaluating the dural tear a knuckle of neural elements were noted over the inferior extent of the previous dural tear suggesting that some herniation of neural elements may have occurred with leak. Stay sutures were then placed distal to the dural tear the length of the tear measuring about 4 mm. And a single stay also placed cranial to this small area of knuckle. The dura then placed under traction and the neural elements carefully reduced within the dura using a Penfield 4 and nerve hook. After reduction a single 4-0 Nurolon suture was placed. A careful exploration of the L2-3 level on the left side was carried out a plexus of epidural veins along the left L3 shoulder identified and bipolar electrocautery used to control any bleeding tear. Patient then a Valsalva to 40 mm of mercury which expanded the dura nicely no leak was noted. A portion of dura foam then placed over the suture repair site and then a layer of DuraSeal fibrin glue. Note that prior to the durafoam and DuraSeal irrigation was carried out with relief 300 cc of normal saline solution examination of the central laminotomy demonstrated no active bleeding. Dura from and DuraSeal placed and following application and drying the incision was then closed by approximating the paralumbar muscles in the midline with interrupted #1 Vicryl sutures reattaching the lumbodorsal fascia to the L1  L2 spinous processes and the ends interspinous process ligaments and then approximating the lumbodorsal fascia to itself in the midline with interrupted simple sutures of #1 Vicryl and figure-of-eight sutures. Deep  subcutaneous layers approximated with 0 Vicryl sutures subcutaneous layers with interrupted 2-0 Vicryl sutures. Skin closed with a running vertical mattress suture of 3-0 nylon. Mepilex bandage was then applied. Patient then returned to a supine position reactivated and extubated and returned to the recovery room in satisfactory condition all instrument and sponge counts were correct.       NITKA,JAMES E  08/10/2012, 3:41 PM

## 2012-08-11 ENCOUNTER — Other Ambulatory Visit: Payer: Self-pay | Admitting: Gastroenterology

## 2012-08-11 LAB — CBC
Hemoglobin: 12.6 g/dL — ABNORMAL LOW (ref 13.0–17.0)
MCH: 32.5 pg (ref 26.0–34.0)
MCV: 91 fL (ref 78.0–100.0)
RBC: 3.88 MIL/uL — ABNORMAL LOW (ref 4.22–5.81)

## 2012-08-11 LAB — BASIC METABOLIC PANEL
CO2: 25 mEq/L (ref 19–32)
Calcium: 8.5 mg/dL (ref 8.4–10.5)
Creatinine, Ser: 0.8 mg/dL (ref 0.50–1.35)
Glucose, Bld: 135 mg/dL — ABNORMAL HIGH (ref 70–99)

## 2012-08-11 MED ORDER — ALPRAZOLAM 0.5 MG PO TABS
0.5000 mg | ORAL_TABLET | Freq: Two times a day (BID) | ORAL | Status: DC | PRN
  Administered 2012-08-11 – 2012-08-13 (×5): 0.5 mg via ORAL
  Filled 2012-08-11 (×6): qty 1

## 2012-08-11 MED ORDER — POTASSIUM CHLORIDE CRYS ER 20 MEQ PO TBCR
20.0000 meq | EXTENDED_RELEASE_TABLET | Freq: Two times a day (BID) | ORAL | Status: AC
  Administered 2012-08-11 (×2): 20 meq via ORAL
  Filled 2012-08-11 (×2): qty 1

## 2012-08-11 NOTE — Progress Notes (Signed)
Patient ID: Jeff Lopez, male   DOB: 01-13-1951, 62 y.o.   MRN: [id number] PATIENT ID: Jeff Lopez        MRN:  [id number]          DOB/AGE: [birth year] / 62 y.o.    Norlene Campbell, MD   Jacqualine Code, PA-C [address] [place], [place]  [address]                             [phone]   PROGRESS NOTE  Subjective:  negative for Chest Pain  negative for Shortness of Breath  negative for Nausea/Vomiting   negative for Calf Pain    Tolerating Diet: tolerating liquids not hungry         Patient reports pain as mild.     Not hungry but no nausea.  Passing "gas"  No BM  Objective: Vital signs in last 24 hours:   Patient Vitals for the past 24 hrs:  BP Temp Temp src Pulse Resp SpO2  08/11/12 0447 141/67 mmHg 98.6 F (37 C) Oral 85 16 96 %  08/11/12 0031 143/63 mmHg 98.4 F (36.9 C) Oral 86 15 96 %  08/10/12 2044 140/65 mmHg 99 F (37.2 C) Oral 81 16 96 %  08/10/12 1653 137/58 mmHg 97.6 F (36.4 C) - 74 14 96 %  08/10/12 1625 129/73 mmHg - - 82 14 94 %  08/10/12 1610 157/79 mmHg - - 82 13 94 %  08/10/12 1555 165/83 mmHg - - 78 13 95 %  08/10/12 1540 164/89 mmHg - - 81 17 96 %  08/10/12 1525 177/85 mmHg 98.4 F (36.9 C) - 125 16 90 %  08/10/12 1154 - - - - 16 100 %      Intake/Output from previous day:   02/15 0701 - 02/16 0700 In: 2250 [I.V.:2250] Out: 1700 [Urine:1500]   Intake/Output this shift:       Intake/Output     02/15 0701 - 02/16 0700 02/16 0701 - 02/17 0700   P.O. 0    I.V. (mL/kg) 2250 (26.9)    Total Intake(mL/kg) 2250 (26.9)    Urine (mL/kg/hr) 1500 (0.7)    Blood 200 (0.1)    Total Output 1700     Net +550          Urine Occurrence 8 x       LABORATORY DATA:  Recent Labs  08/07/12 0555 08/09/12 1100 08/10/12 0718 08/11/12 0511  WBC 12.1* 10.9*  --  7.0  HGB 13.7 11.7* 12.1* 12.6*  HCT 40.2 32.8* 33.5* 35.3*  PLT 137* 144*  --  199    Recent Labs  08/07/12 0555 08/09/12 1100 08/10/12 0718 08/11/12 0511  NA 137  135 133* 134*  K 3.1* 3.1* 2.8* 3.0*  CL 100 99 95* 95*  CO2 29 29 28 25   BUN 12 9 10 11   CREATININE 0.84 0.71 0.77 0.80  GLUCOSE 188* 178* 121* 135*  CALCIUM 8.0* 8.3* 8.6 8.5   Lab Results  Component Value Date   INR 0.86 01/25/2011    Recent Radiographic Studies :  Dg Lumbar Spine Complete  08/06/2012  *RADIOLOGY REPORT*  Clinical Data: Back pain  DG C-ARM GT 120 MIN,LUMBAR SPINE - COMPLETE 4+ VIEW  Comparison: Multiple priors.  Findings: Prior fusion has been extended upward to involve L2-3 and L3-4 using pedicle screw and rod construct along with interbody cages.  Improved position and alignment.  IMPRESSION: As above.   Original Report Authenticated By: Davonna Belling, M.D.    Ct Lumbar Spine W Contrast  08/09/2012  *RADIOLOGY REPORT*  MYELOGRAM INJECTION  Technique:  Informed consent was obtained from the patient prior to the procedure, including potential complications of headache, allergy, infection and pain. Specific instructions were given regarding 24 hour bedrest post procedure to prevent post-LP headache.  A timeout procedure was performed.  With the patient prone, the lower back was prepped with Betadine.  1% Lidocaine was used for local anesthesia.  Lumbar puncture was performed by the radiologist at the L2-L3 level using a five inch  22 gauge needle with return of clear CSF. I personally performed the lumbar puncture and administered the intrathecal contrast. I also personally supervised acquisition of the myelogram images.  15 cc of Omnipaque 180 was injected into the subarachnoid space .  IMPRESSION: Successful injection of  intrathecal contrast for myelography.  MYELOGRAM LUMBAR  Clinical Data:  Intraoperative dural tear.  Postoperative CSF low pressure headaches.  Comparison: Intraoperative film 08/06/2012.  MRI 05/27/2012.  Findings: The lower third of the patient's bandage was removed. Copious Betadine cleansing was performed and a 22 gauge 5 inch needle was placed to  approximately 1 cm lateral to the incision at the L5 level and directed upward at an approximately 45 degree angle in the midline.  Cross-table lateral film was used to establish needle position depth.  Contrast injection showed mild stenosis at the L3-L4 level.  There was leakage of contrast from the thecal sac dorsally at L2-L3 seen best on series 15 image 1 (arrows).  This contrast was seen to accumulate lateral to the left L3 pedicle screw on series 16 image 1 (arrows).  Fluoroscopy Time: 3.27 minutes  IMPRESSION: Findings consistent with a dorsal cerebrospinal fluid leak at L2-L3 on the left.  CT MYELOGRAPHY LUMBAR SPINE  Technique: Multidetector CT imaging of the lumbar spine was performed following myelography.  Multiplanar CT image reconstructions were also generated.  Findings:  Nonaneurysmal atherosclerotic calcification of the aorta.  L1-2: Normal.  L2-3: Status post posterolateral and interbody fusion.  Interbody cage good position.  Pedicle screws, as are appropriately placed although very slight medial extension of right L3 screw and both L2 screws.  There is a large cerebrospinal fluid leak emanating from the dorsolateral aspect of the thecal sac at L2-3 on the left (images 57-60, series 4). Marked contrast extravasation is noted. There is a moderate amount of air within the interspace and the surrounding the hardware.  There is slight mass effect on the thecal sac and left L3 nerve root.  There is contrast accumulation dorsal to the thecal sac in a seroma (32 x 60 mm cross-section opposite L4), and contrast extends into the fluid which has accumulated underneath the wound throughout its extent.  L3-4: Status post posterolateral and interbody fusion.  Interbody cage good position.  Pedicle screws in good position.  Small ventral epidural hematoma is eccentric to the left.  There may be disc material and hematoma in the left neural foramen.  There is moderate mass effect on the thecal sac from the  left.  L4-5: Status post remote posterolateral and interbody fusion. Interbody fusion is solid.  Pedicle screws appropriately positioned.  No residual neural encroachment.  L5-S1: Status post posterolateral and interbody fusion.  There is no solid interbody fusion.  Both S1 screws are loose.  There is no residual neural compression.  IMPRESSION: Status post L2 through L4 fusion with intraoperative dural  tear.  Large cerebrospinal fluid leak emanating from the L2-3 level on the left.  Moderate subcutaneous and deep dorsal seroma.  Slight mass effect on the thecal sac at L2-3 and L3-4 on the left, multifactorial.  Nonunion L5-S1.  Solid fusion L4-L5.   Original Report Authenticated By: Davonna Belling, M.D.    Dg Myelogram Lumbar  08/09/2012  *RADIOLOGY REPORT*  MYELOGRAM INJECTION  Technique:  Informed consent was obtained from the patient prior to the procedure, including potential complications of headache, allergy, infection and pain. Specific instructions were given regarding 24 hour bedrest post procedure to prevent post-LP headache.  A timeout procedure was performed.  With the patient prone, the lower back was prepped with Betadine.  1% Lidocaine was used for local anesthesia.  Lumbar puncture was performed by the radiologist at the L2-L3 level using a five inch  22 gauge needle with return of clear CSF. I personally performed the lumbar puncture and administered the intrathecal contrast. I also personally supervised acquisition of the myelogram images.  15 cc of Omnipaque 180 was injected into the subarachnoid space .  IMPRESSION: Successful injection of  intrathecal contrast for myelography.  MYELOGRAM LUMBAR  Clinical Data:  Intraoperative dural tear.  Postoperative CSF low pressure headaches.  Comparison: Intraoperative film 08/06/2012.  MRI 05/27/2012.  Findings: The lower third of the patient's bandage was removed. Copious Betadine cleansing was performed and a 22 gauge 5 inch needle was placed to  approximately 1 cm lateral to the incision at the L5 level and directed upward at an approximately 45 degree angle in the midline.  Cross-table lateral film was used to establish needle position depth.  Contrast injection showed mild stenosis at the L3-L4 level.  There was leakage of contrast from the thecal sac dorsally at L2-L3 seen best on series 15 image 1 (arrows).  This contrast was seen to accumulate lateral to the left L3 pedicle screw on series 16 image 1 (arrows).  Fluoroscopy Time: 3.27 minutes  IMPRESSION: Findings consistent with a dorsal cerebrospinal fluid leak at L2-L3 on the left.  CT MYELOGRAPHY LUMBAR SPINE  Technique: Multidetector CT imaging of the lumbar spine was performed following myelography.  Multiplanar CT image reconstructions were also generated.  Findings:  Nonaneurysmal atherosclerotic calcification of the aorta.  L1-2: Normal.  L2-3: Status post posterolateral and interbody fusion.  Interbody cage good position.  Pedicle screws, as are appropriately placed although very slight medial extension of right L3 screw and both L2 screws.  There is a large cerebrospinal fluid leak emanating from the dorsolateral aspect of the thecal sac at L2-3 on the left (images 57-60, series 4). Marked contrast extravasation is noted. There is a moderate amount of air within the interspace and the surrounding the hardware.  There is slight mass effect on the thecal sac and left L3 nerve root.  There is contrast accumulation dorsal to the thecal sac in a seroma (32 x 60 mm cross-section opposite L4), and contrast extends into the fluid which has accumulated underneath the wound throughout its extent.  L3-4: Status post posterolateral and interbody fusion.  Interbody cage good position.  Pedicle screws in good position.  Small ventral epidural hematoma is eccentric to the left.  There may be disc material and hematoma in the left neural foramen.  There is moderate mass effect on the thecal sac from the  left.  L4-5: Status post remote posterolateral and interbody fusion. Interbody fusion is solid.  Pedicle screws appropriately positioned.  No residual neural encroachment.  L5-S1: Status post posterolateral and interbody fusion.  There is no solid interbody fusion.  Both S1 screws are loose.  There is no residual neural compression.  IMPRESSION: Status post L2 through L4 fusion with intraoperative dural tear.  Large cerebrospinal fluid leak emanating from the L2-3 level on the left.  Moderate subcutaneous and deep dorsal seroma.  Slight mass effect on the thecal sac at L2-3 and L3-4 on the left, multifactorial.  Nonunion L5-S1.  Solid fusion L4-L5.   Original Report Authenticated By: Davonna Belling, M.D.    Dg C-arm Gt 120 Min  08/06/2012  *RADIOLOGY REPORT*  Clinical Data: Back pain  DG C-ARM GT 120 MIN,LUMBAR SPINE - COMPLETE 4+ VIEW  Comparison: Multiple priors.  Findings: Prior fusion has been extended upward to involve L2-3 and L3-4 using pedicle screw and rod construct along with interbody cages.  Improved position and alignment.  IMPRESSION: As above.   Original Report Authenticated By: Davonna Belling, M.D.      Examination:  General appearance: alert, cooperative and mild distress Resp: clear to auscultation bilaterally Cardio: regular rate and rhythm GI: abnormal findings:  hypoactive bowel sounds but present  Wound Exam: clean, dry, intact dressing  Motor Exam: EHL, FHL, Anterior Tibial and Posterior Tibial Intact  Sensory Exam: Superficial Peroneal, Deep Peroneal and Tibial normal  Vascular Exam: DP 1+ bilaterally  Assessment:    1 Day Post-Op  Procedure(s) (LRB): Dura Repair Left Side L2-L3 (Left)  ADDITIONAL DIAGNOSIS:  Principal Problem:   Degenerative disc disease, lumbar Active Problems:   HNP (herniated nucleus pulposus), lumbar   Postoperative CSF leak  Hypokalemia   Plan: As per orders will keep at bed rest  Will add KCL for K of 3.0           Jossalin Chervenak 08/11/2012 11:00 AM

## 2012-08-12 LAB — BASIC METABOLIC PANEL
BUN: 13 mg/dL (ref 6–23)
GFR calc non Af Amer: >90 mL/min (ref >90)
Glucose, Bld: 114 mg/dL — ABNORMAL HIGH (ref 70–99)
Potassium: 3.2 mEq/L — ABNORMAL LOW (ref 3.5–5.1)

## 2012-08-12 LAB — WOUND CULTURE: Culture: NO GROWTH

## 2012-08-12 MED ORDER — POTASSIUM CHLORIDE CRYS ER 20 MEQ PO TBCR
20.0000 meq | EXTENDED_RELEASE_TABLET | Freq: Two times a day (BID) | ORAL | Status: DC
  Administered 2012-08-12 – 2012-08-13 (×3): 20 meq via ORAL
  Filled 2012-08-12 (×4): qty 1

## 2012-08-12 MED FILL — Sodium Chloride IV Soln 0.9%: INTRAVENOUS | Qty: 1000 | Status: AC

## 2012-08-12 MED FILL — Sodium Chloride Irrigation Soln 0.9%: Qty: 3000 | Status: AC

## 2012-08-12 MED FILL — Heparin Sodium (Porcine) Inj 1000 Unit/ML: INTRAMUSCULAR | Qty: 30 | Status: AC

## 2012-08-12 NOTE — Progress Notes (Signed)
Pt continuously educated on the importance of lying flat with bedrest and repeatedly raises the head of bed. Currently lying flat with HOB at 12 degrees and refuses to be lowered.

## 2012-08-12 NOTE — Progress Notes (Signed)
Patient ID: Jeff Lopez, male   DOB: 1951/03/08, 62 y.o.   MRN: [id number] Subjective: 2 Days Post-Op Procedure(s) (LRB): Dura Repair Left Side L2-L3 (Left) No head ache. He sat up one time while confused yesterday. Having period of confusion while taking percocet and robaxin. Patient reports pain as moderate.    Objective:   VITALS:  Temp:  [97.7 F (36.5 C)-98.1 F (36.7 C)] 98.1 F (36.7 C) (02/17 0700) Pulse Rate:  [66-103] 66 (02/17 0700) Resp:  [16-18] 16 (02/17 0700) BP: (120-149)/(60-86) 149/77 mmHg (02/17 0700) SpO2:  [95 %-100 %] 95 % (02/17 0700)  Neurologically intact ABD soft Incision: scant drainage No cellulitis present No fluctuance, no drainage.  LABS  Recent Labs  08/10/12 0718 08/11/12 0511  HGB 12.1* 12.6*  WBC  --  7.0  PLT  --  199    Recent Labs  08/11/12 0511 08/12/12 0548  NA 134* 135  K 3.0* 3.2*  CL 95* 99  CO2 25 26  BUN 11 13  CREATININE 0.80 0.80  GLUCOSE 135* 114*   No results found for this basename: LABPT, INR,  in the last 72 hours   Assessment/Plan: 2 Days Post-Op Procedure(s) (LRB): Dura Repair Left Side L2-L3 (Left)  Advance diet Up with therapy starting tomorrow. Discharge home with home health when he demonstrates independent ambulation and stair climbing. Likely Wednesday.   Dustine Stickler E 08/12/2012, 11:11 AM

## 2012-08-12 NOTE — Progress Notes (Signed)
IV dcd at 0600 per pt request refused restart stated" I'm going home today!'.has been extememely irritable,impatient,and verbally abusive to nursing staff.Constant calls and requests for attention.prn's given as frequently as ordered for agitation and pain.VSS,bedrest,log roll continues,pt raises hob himself knowing of restrictions.Linward Headland D

## 2012-08-13 MED ORDER — METHOCARBAMOL 500 MG PO TABS: 500.0000 mg | ORAL_TABLET | Freq: Four times a day (QID) | ORAL | Status: DC | PRN

## 2012-08-13 MED ORDER — OXYCODONE-ACETAMINOPHEN 7.5-325 MG PO TABS: 1.0000 | ORAL_TABLET | Freq: Four times a day (QID) | ORAL | Status: DC | PRN

## 2012-08-13 NOTE — Plan of Care (Signed)
Problem: Phase III Progression Outcomes Goal: Discharge plan remains appropriate-arrangements made Recommend HH OT for ADL trg and ADL mobility safety trg after acute care d/c     

## 2012-08-13 NOTE — Progress Notes (Signed)
CARE MANAGEMENT NOTE 08/13/2012  Patient:  Jeff Lopez, BRINGHURST   Account Number:  [id number]  Date Initiated:  08/07/2012  Documentation initiated by:  Vance Peper  Subjective/Objective Assessment:   62 yr old male s/p 2 level lumbar fusion with dural tear.     Action/Plan:   CM will follow. PT has to work with patient.   Anticipated DC Date:  08/13/2012   Anticipated DC Plan:  HOME/SELF CARE      DC Planning Services  CM consult      PAC Choice  DURABLE MEDICAL EQUIPMENT   Choice offered to / List presented to:          [facility] arranged  HH - 11 Patient Refused      Status of service:  Completed, signed off Medicare Important Message given?   (If response is "NO", the following Medicare IM given date fields will be blank) Date Medicare IM given:   Date Additional Medicare IM given:    Discharge Disposition:  HOME/SELF CARE  Per UR Regulation:    If discussed at Long Length of Stay Meetings, dates discussed:    Comments:  08/13/12 16:35pm Vance Peper, RN BSN CM spoke with patient concerning home health and DME needs at discharge. Patient states he doesnt want home health. He has his own rolling walker at home.  08/12/12 11:34am Vance Peper, RN BSN Case Manager Patient had repair of dural tear on 08/11/12, will remain on bedrest until 08/13/12. CM will follow.  08/09/12 2:56 pm Charlyn Minerva BSN Case Manager CM spoke with patient briefly, returning from CT myelogram.Very uncomfortable. Will continue to follow.

## 2012-08-13 NOTE — Evaluation (Signed)
Occupational Therapy Evaluation Patient Details Name: JT BRABEC MRN: [id number] DOB: 1950-09-23 Today's Date: 08/13/2012 Time: 2952-8413 OT Time Calculation (min): 51 min  OT Assessment / Plan / Recommendation Clinical Impression  Pt referred to OT services following back surgery. Pt is at set up/sup level with UB ADLs and min A , LB ADLs and Mod I/sup level with ADL mobility. Pt requires no further acute OT services at this time, all education completed. OT will sign off    OT Assessment  Patient does not need any further OT services    Follow Up Recommendations  Home health OT    Barriers to Discharge  None, pt plans to d/c to his father's home    Equipment Recommendations  None recommended by OT    Recommendations for Other Services    Frequency       Precautions / Restrictions Precautions Precautions: Back Precaution Booklet Issued: Yes (comment) Precaution Comments: pt unable to recalll back precautions, reviewed back precautions with pt Required Braces or Orthoses: Spinal Brace Spinal Brace: Lumbar corset;Applied in sitting position Restrictions Weight Bearing Restrictions: No RLE Weight Bearing: Weight bearing as tolerated LLE Weight Bearing: Weight bearing as tolerated       ADL  Grooming: Performed;Wash/dry hands;Wash/dry face;Supervision/safety;Set up Where Assessed - Grooming: Supported standing Upper Body Bathing: Performed;Supervision/safety;Set up Where Assessed - Upper Body Bathing: Supported standing Lower Body Bathing: Simulated;Minimal assistance Where Assessed - Lower Body Bathing: Unsupported sitting;Supported standing Upper Body Dressing: Performed;Supervision/safety;Set up Where Assessed - Upper Body Dressing: Unsupported sitting Lower Body Dressing: Performed;Minimal assistance Where Assessed - Lower Body Dressing: Unsupported sitting Toilet Transfer: Performed;Supervision/safety Toilet Transfer Method: Sit to stand;Other (comment)  (ambulating with RW) Toilet Transfer Equipment: Raised toilet seat with arms (or 3-in-1 over toilet);Grab bars Toileting - Clothing Manipulation and Hygiene: Performed;Supervision/safety Where Assessed - Toileting Clothing Manipulation and Hygiene: Standing Transfers/Ambulation Related to ADLs: Pt required verbal cues to maintain back precautions for no bending ADL Comments: reviewed ADL A/E with pt, pt has A/E at home form previous surgeries    OT Diagnosis:    OT Problem List:   OT Treatment Interventions:     OT Goals    Visit Information  Last OT Received On: 08/13/12     Subjective Data  Subjective: " I want to get out of here today " Patient Stated Goal: To return home   Prior Functioning     Home Living Lives With: Alone Type of Home: House Home Access: Stairs to enter [employer] of Steps: 4 Entrance Stairs-Rails: Right;Left;Can reach both Home Layout: One level Bathroom Shower/Tub: Forensic scientist: Standard Home Adaptive Equipment: Walker - rolling;Bedside commode/3-in-1 Prior Function Level of Independence: Independent Able to Take Stairs?: Yes Driving: Yes Vocation: Full time employment Communication Communication: No difficulties Dominant Hand: Right         Vision/Perception Vision - History Baseline Vision: Wears glasses only for reading Patient Visual Report: No change from baseline Vision - Assessment Eye Alignment: Within Functional Limits Perception Perception: Within Functional Limits   Cognition  Cognition Overall Cognitive Status: Appears within functional limits for tasks assessed/performed Arousal/Alertness: Awake/alert Orientation Level: Appears intact for tasks assessed Behavior During Session: [facility] for tasks performed    Extremity/Trunk Assessment Right Upper Extremity Assessment RUE ROM/Strength/Tone: Within functional levels Left Upper Extremity Assessment LUE ROM/Strength/Tone: Within  functional levels     Mobility Bed Mobility Bed Mobility: Rolling Left;Left Sidelying to Sit;Sit to Sidelying Left Rolling Left: 6: Modified independent (Device/Increase time) Left Sidelying  to Sit: 6: Modified independent (Device/Increase time) Sit to Sidelying Left: 6: Modified independent (Device/Increase time) Details for Bed Mobility Assistance: no instruction required pt demonstrating safe technique.  Transfers Sit to Stand: 6: Modified independent (Device/Increase time);From chair/3-in-1;From bed;Without upper extremity assist Stand to Sit: 6: Modified independent (Device/Increase time);To chair/3-in-1;Without upper extremity assist     Exercise     Balance Balance Balance Assessed: No     End of Session OT - End of Session Equipment Utilized During Treatment: Gait belt;Other (comment) (RW, 3 in 1, ADL A/E) Activity Tolerance: Patient tolerated treatment well Patient left: in chair;with call bell/phone within reach  GO     Galen Manila 08/13/2012, 2:22 PM

## 2012-08-13 NOTE — Progress Notes (Signed)
Pt placed on bed alarm this morning when placed back to bed after being up with physical therapy. Pt called for assistance to the bathroom and he had stated no one came after he pressed his call bell and requested assistance. RN heard bed alarm going off and went into pts room and found him in the bathroom unassisted. RN reinforced the importance of getting up with staff. Helped pt back to bed and placed pt back on bed alarm. Wrote RN telephone number and NT telephone number on white board for patient to use. Spoke with physical therapy and physical therapy stated that pt was very impulsive. Will complete hourly rounds and ask pt if he needs to use the restroom.

## 2012-08-13 NOTE — Progress Notes (Signed)
Physical Therapy Treatment Patient Details Name: Jeff Lopez MRN: [id number] DOB: 1951/06/26 Today's Date: 08/13/2012 Time: 1478-2956 PT Time Calculation (min): 15 min  PT Assessment / Plan / Recommendation Comments on Treatment Session  Pt tolertated activity well.  Pain well controlled and demonstrating safety with mobility.  Pt will benefit from use of RW and 3 in 1 until general strength with mobility improves.      Follow Up Recommendations  Home health PT     Does the patient have the potential to tolerate intense rehabilitation     Barriers to Discharge        Equipment Recommendations  Rolling walker with 5" wheels;Other (comment) (3 in 1)    Recommendations for Other Services    Frequency Min 5X/week   Plan Discharge plan remains appropriate;Frequency remains appropriate    Precautions / Restrictions Precautions Precautions: Back Precaution Booklet Issued: Yes (comment) Precaution Comments: Pt able to recall 2/3 back precautions.  Required Braces or Orthoses: Spinal Brace Spinal Brace: Lumbar corset;Applied in sitting position Restrictions Weight Bearing Restrictions: No RLE Weight Bearing: Weight bearing as tolerated LLE Weight Bearing: Weight bearing as tolerated   Pertinent Vitals/Pain 5/10 pain in low back.  Pt medicated prior to session.      Mobility  Bed Mobility Bed Mobility: Rolling Left;Left Sidelying to Sit;Sit to Sidelying Left Rolling Left: 6: Modified independent (Device/Increase time) Left Sidelying to Sit: 6: Modified independent (Device/Increase time) Sit to Sidelying Left: 6: Modified independent (Device/Increase time) Details for Bed Mobility Assistance: no instruction required pt demonstrating safe technique.  Transfers Transfers: Sit to Stand;Stand to Sit Sit to Stand: 6: Modified independent (Device/Increase time);From chair/3-in-1;With upper extremity assist;From bed Stand to Sit: 6: Modified independent (Device/Increase time);To  chair/3-in-1;With upper extremity assist Ambulation/Gait Ambulation/Gait Assistance: 5: Supervision Ambulation Distance (Feet): 150 Feet Assistive device: Rolling walker Ambulation/Gait Assistance Details: VCs to increase gait speed, maintain upright trunk posture, and stay within walker.   Gait Pattern: Step-to pattern;Step-through pattern;Trunk flexed Gait velocity: slow Stairs: Yes Stairs Assistance: 5: Supervision Stairs Assistance Details (indicate cue type and reason): cues for sequencing.  Stair Management Technique: One rail Right Number of Stairs: 3 Wheelchair Mobility Wheelchair Mobility: No    Exercises     PT Diagnosis:    PT Problem List:   PT Treatment Interventions:     PT Goals Acute Rehab PT Goals PT Goal Formulation: With patient Time For Goal Achievement: 08/14/12 Potential to Achieve Goals: Good Pt will Roll Supine to Right Side: with modified independence PT Goal: Rolling Supine to Right Side - Progress: Met Pt will Roll Supine to Left Side: with modified independence PT Goal: Rolling Supine to Left Side - Progress: Met Pt will go Supine/Side to Sit: with modified independence PT Goal: Supine/Side to Sit - Progress: Met Pt will go Sit to Supine/Side: with modified independence PT Goal: Sit to Supine/Side - Progress: Met Pt will go Sit to Stand: with modified independence PT Goal: Sit to Stand - Progress: Met Pt will go Stand to Sit: with modified independence PT Goal: Stand to Sit - Progress: Met Pt will Transfer Bed to Chair/Chair to Bed: with modified independence PT Transfer Goal: Bed to Chair/Chair to Bed - Progress: Met Pt will Ambulate: 51 - 150 feet;with modified independence;with rolling walker PT Goal: Ambulate - Progress: Met Pt will Go Up / Down Stairs: 3-5 stairs;with supervision;with least restrictive assistive device PT Goal: Up/Down Stairs - Progress: Met  Visit Information  Last PT Received On: 08/13/12 Assistance  Needed: +1     Subjective Data  Subjective: I am glad to get out of that bed. No more headache.  Patient Stated Goal: Walk without pain. Return to work.    Cognition  Cognition Overall Cognitive Status: Appears within functional limits for tasks assessed/performed Arousal/Alertness: Awake/alert Orientation Level: Appears intact for tasks assessed Behavior During Session: [facility] for tasks performed    Balance  Balance Balance Assessed: Yes Static Sitting Balance Static Sitting - Balance Support: Feet supported;Bilateral upper extremity supported Static Sitting - Level of Assistance: 7: Independent Static Standing Balance Static Standing - Balance Support: No upper extremity supported Static Standing - Level of Assistance: 7: Independent Static Standing - Comment/# of Minutes: 1 minute with no LOB.   End of Session PT - End of Session Equipment Utilized During Treatment: Back brace;Gait belt Activity Tolerance: Patient tolerated treatment well Patient left: in chair;with call bell/phone within reach Nurse Communication: Mobility status;Precautions   GP     Nandan Willems 08/13/2012, 1:04 PM Jania Steinke L. Tanicia Wolaver DPT [phone]

## 2012-08-13 NOTE — Progress Notes (Signed)
Pt discharged to home accompanied by family. Discharge instructions and rx given and explained and pt stated understanding. Pts IV was removed prior to discharge. Pt left unit in a stable condition via wheelchair.

## 2012-08-13 NOTE — Progress Notes (Signed)
Subjective: 3 Days Post-Op Procedure(s) (LRB): Dura Repair Left Side L2-L3 (Left) Awake, alert and oriented x 4. Pain is moderate. No head ache.  C/o loose stool able to move feet without difficulty. Patient reports pain as moderate.    Objective:   VITALS:  Temp:  [97.7 F (36.5 C)-98.3 F (36.8 C)] 97.7 F (36.5 C) (02/18 0518) Pulse Rate:  [74-83] 83 (02/18 0518) Resp:  [19-20] 19 (02/18 0518) BP: (140-167)/(69-93) 147/93 mmHg (02/18 0518) SpO2:  [97 %-98 %] 97 % (02/18 0518)  Neurologically intact ABD soft Intact pulses distally Dorsiflexion/Plantar flexion intact Incision: dressing C/D/I   LABS  Recent Labs  08/11/12 0511  HGB 12.6*  WBC 7.0  PLT 199    Recent Labs  08/11/12 0511 08/12/12 0548  NA 134* 135  K 3.0* 3.2*  CL 95* 99  CO2 25 26  BUN 11 13  CREATININE 0.80 0.80  GLUCOSE 135* 114*   No results found for this basename: LABPT, INR,  in the last 72 hours   Assessment/Plan: 3 Days Post-Op Procedure(s) (LRB): Dura Repair Left Side L2-L3 (Left)  Advance diet Up with therapy D/C IV fluids Discharge home with home health will check this afternoon and if no change and He is comfortable and demonstrates independent ambulation and stairs will consider for d/c   Storey Stangeland E 08/13/2012, 10:44 AM

## 2012-08-13 NOTE — Progress Notes (Signed)
Subjective: 3 Days Post-Op Procedure(s) (LRB): Dura Repair Left Side L2-L3 (Left) Patient reports pain as mild.  Did well with PT and wishes to go home.  No headache or nausea.   Eating well.    Objective: Vital signs in last 24 hours: Temp:  [97.7 F (36.5 C)-98.3 F (36.8 C)] 97.7 F (36.5 C) (02/18 0518) Pulse Rate:  [78-83] 83 (02/18 0518) Resp:  [19] 19 (02/18 0518) BP: (147-167)/(88-93) 147/93 mmHg (02/18 0518) SpO2:  [97 %-98 %] 97 % (02/18 0518)  Intake/Output from previous day: 02/17 0701 - 02/18 0700 In: 354 [P.O.:354] Out: 1025 [Urine:1025] Intake/Output this shift:     Recent Labs  08/11/12 0511  HGB 12.6*    Recent Labs  08/11/12 0511  WBC 7.0  RBC 3.88*  HCT 35.3*  PLT 199    Recent Labs  08/11/12 0511 08/12/12 0548  NA 134* 135  K 3.0* 3.2*  CL 95* 99  CO2 25 26  BUN 11 13  CREATININE 0.80 0.80  GLUCOSE 135* 114*  CALCIUM 8.5 8.0*   No results found for this basename: LABPT, INR,  in the last 72 hours  Neurovascular intact Incision: no drainage  Assessment/Plan: 3 Days Post-Op Procedure(s) (LRB): Dura Repair Left Side L2-L3 (Left) Will discharge to home this afternoon. OV 2 weeks  Cameshia Cressman M 08/13/2012, 3:38 PM

## 2012-08-14 NOTE — Progress Notes (Signed)
Patient examined and lab reviewed with Vernon PA-C. 

## 2012-08-15 ENCOUNTER — Encounter (HOSPITAL_COMMUNITY): Payer: Self-pay | Admitting: Specialist

## 2012-08-15 LAB — ANAEROBIC CULTURE

## 2012-08-16 ENCOUNTER — Other Ambulatory Visit: Payer: Self-pay | Admitting: Cardiovascular Disease

## 2012-08-19 NOTE — Discharge Summary (Signed)
Physician Discharge Summary  Patient ID: Jeff Lopez MRN: [id number] DOB/AGE: 1951/06/18 62 y.o.  Admit date: 08/06/2012 Discharge date: 08/19/2012  Admission Diagnoses:  Degenerative disc disease, lumbar L3-4 above fused segment L4-S1. HNP Left L3-4  Discharge Diagnoses:  Principal Problem:   Degenerative disc disease, lumbar Active Problems:   HNP (herniated nucleus pulposus), lumbar   Postoperative CSF leak   Past Medical History  Diagnosis Date  . HYPERLIPIDEMIA 04/09/2007  . HYPERTENSION, BENIGN 04/19/2010  . GERD 04/09/2007  . RENAL CYST 05/27/2010  . OSTEOARTHRITIS, LUMBAR SPINE 04/09/2007  . LUMBAR DISC DISORDER 05/27/2010  . CAD, NATIVE VESSEL 04/19/2010    nonobstructive by cath 2007:  oLAD 20-30%, mLAD 50%, pCFX 20-30%, oAVCFX 20-30%, L renal art 50%;  normal LVF  . Headache     Surgeries: Procedure(s):POSTERIOR LUMBAR FUSION 2 LEVEL (N/A) - Discectomy Left L3-4 and TLIF Left L2-3 and Left L3-4 with pedicle screws, rods, cages, local bone graft. Posterolateral fusion L2-3 and L3-4, repair of dural tear left L2-3 with 4-0 Neurolon and Duraseal. 5.49mm x 40 mm screws x 4. Removed rods L4-S1 then reinserted L2-S1 on 08/06/12  Dura Repair Left Side L2-L3 on 08/10/12   Consultants (if any):  none  Discharged Condition: Improved  Hospital Course: Jeff Lopez is an 62 y.o. male who was admitted 08/06/2012 with a diagnosis of Degenerative disc disease, lumbar and went to the operating room on 08/06/2012 - 08/10/2012 and underwent the above named procedures.    He was given perioperative antibiotics:  Anti-infectives   Start     Dose/Rate Route Frequency Ordered Stop   08/11/12 0030  vancomycin (VANCOCIN) 1,500 mg in sodium chloride 0.9 % 500 mL IVPB     1,500 mg 250 mL/hr over 120 Minutes Intravenous  Once 08/10/12 1707 08/11/12 0155   08/10/12 0600  vancomycin (VANCOCIN) IVPB 1000 mg/200 mL premix     1,000 mg 200 mL/hr over 60 Minutes Intravenous 60 min pre-op  08/09/12 1817 08/10/12 1241   08/06/12 2000  vancomycin (VANCOCIN) 1,250 mg in sodium chloride 0.9 % 250 mL IVPB     1,250 mg 166.7 mL/hr over 90 Minutes Intravenous  Once 08/06/12 1535 08/06/12 2141   08/06/12 0600  vancomycin (VANCOCIN) IVPB 1000 mg/200 mL premix     1,000 mg 200 mL/hr over 60 Minutes Intravenous On call to O.R. 08/05/12 1416 08/06/12 0800    Pt remained at bedrest following the initial procedure for 3 days.  He continued to have headache, photophobia and nausea.  CT and myelogram were preformed to assess dural tear and pt was found to have a continued dural leak at L2-3 on the left.  He returned to the OR for repair. Post operatively his headache was relieved and he was no longer nauseated. Pt remained at bed rest flat until 08/13/12 at which time he began activity without further complications.  He was walking in the hallway.  Pt has BM and was voiding well.  He was taking a regular diet.  Pain was controlled with po meds.  He was discharged to the care of his family.  He was given sequential compression devices and aspirin for DVT prophylaxis.  He benefited maximally from the hospital stay and was discharged in stable condition.      Recent vital signs:  Filed Vitals:   08/13/12 0518  BP: 147/93  Pulse: 83  Temp: 97.7 F (36.5 C)  Resp: 19    Recent laboratory studies:  Lab Results  Component  Value Date   HGB 12.6* 08/11/2012   HGB 12.1* 08/10/2012   HGB 11.7* 08/09/2012   Lab Results  Component Value Date   WBC 7.0 08/11/2012   PLT 199 08/11/2012   Lab Results  Component Value Date   INR 0.86 01/25/2011   Lab Results  Component Value Date   NA 135 08/12/2012   K 3.2* 08/12/2012   CL 99 08/12/2012   CO2 26 08/12/2012   BUN 13 08/12/2012   CREATININE 0.80 08/12/2012   GLUCOSE 114* 08/12/2012    Discharge Medications:     Medication List    STOP taking these medications       Oxycodone HCl 10 MG Tabs      TAKE these medications       methocarbamol  500 MG tablet  Commonly known as:  ROBAXIN  Take 1 tablet (500 mg total) by mouth every 6 (six) hours as needed.     oxyCODONE-acetaminophen 7.5-325 MG per tablet  Commonly known as:  PERCOCET  Take 1-2 tablets by mouth every 6 (six) hours as needed for pain.     pantoprazole 40 MG tablet  Commonly known as:  PROTONIX  take 1 tablet by mouth twice a day BEFOREA MEAL        Diagnostic Studies: Dg Lumbar Spine Complete  08/06/2012  *RADIOLOGY REPORT*  Clinical Data: Back pain  DG C-ARM GT 120 MIN,LUMBAR SPINE - COMPLETE 4+ VIEW  Comparison: Multiple priors.  Findings: Prior fusion has been extended upward to involve L2-3 and L3-4 using pedicle screw and rod construct along with interbody cages.  Improved position and alignment.  IMPRESSION: As above.   Original Report Authenticated By: Davonna Belling, M.D.    Ct Lumbar Spine W Contrast  08/09/2012  *RADIOLOGY REPORT*  MYELOGRAM INJECTION  Technique:  Informed consent was obtained from the patient prior to the procedure, including potential complications of headache, allergy, infection and pain. Specific instructions were given regarding 24 hour bedrest post procedure to prevent post-LP headache.  A timeout procedure was performed.  With the patient prone, the lower back was prepped with Betadine.  1% Lidocaine was used for local anesthesia.  Lumbar puncture was performed by the radiologist at the L2-L3 level using a five inch  22 gauge needle with return of clear CSF. I personally performed the lumbar puncture and administered the intrathecal contrast. I also personally supervised acquisition of the myelogram images.  15 cc of Omnipaque 180 was injected into the subarachnoid space .  IMPRESSION: Successful injection of  intrathecal contrast for myelography.  MYELOGRAM LUMBAR  Clinical Data:  Intraoperative dural tear.  Postoperative CSF low pressure headaches.  Comparison: Intraoperative film 08/06/2012.  MRI 05/27/2012.  Findings: The lower third of  the patient's bandage was removed. Copious Betadine cleansing was performed and a 22 gauge 5 inch needle was placed to approximately 1 cm lateral to the incision at the L5 level and directed upward at an approximately 45 degree angle in the midline.  Cross-table lateral film was used to establish needle position depth.  Contrast injection showed mild stenosis at the L3-L4 level.  There was leakage of contrast from the thecal sac dorsally at L2-L3 seen best on series 15 image 1 (arrows).  This contrast was seen to accumulate lateral to the left L3 pedicle screw on series 16 image 1 (arrows).  Fluoroscopy Time: 3.27 minutes  IMPRESSION: Findings consistent with a dorsal cerebrospinal fluid leak at L2-L3 on the left.  CT MYELOGRAPHY LUMBAR  SPINE  Technique: Multidetector CT imaging of the lumbar spine was performed following myelography.  Multiplanar CT image reconstructions were also generated.  Findings:  Nonaneurysmal atherosclerotic calcification of the aorta.  L1-2: Normal.  L2-3: Status post posterolateral and interbody fusion.  Interbody cage good position.  Pedicle screws, as are appropriately placed although very slight medial extension of right L3 screw and both L2 screws.  There is a large cerebrospinal fluid leak emanating from the dorsolateral aspect of the thecal sac at L2-3 on the left (images 57-60, series 4). Marked contrast extravasation is noted. There is a moderate amount of air within the interspace and the surrounding the hardware.  There is slight mass effect on the thecal sac and left L3 nerve root.  There is contrast accumulation dorsal to the thecal sac in a seroma (32 x 60 mm cross-section opposite L4), and contrast extends into the fluid which has accumulated underneath the wound throughout its extent.  L3-4: Status post posterolateral and interbody fusion.  Interbody cage good position.  Pedicle screws in good position.  Small ventral epidural hematoma is eccentric to the left.  There may  be disc material and hematoma in the left neural foramen.  There is moderate mass effect on the thecal sac from the left.  L4-5: Status post remote posterolateral and interbody fusion. Interbody fusion is solid.  Pedicle screws appropriately positioned.  No residual neural encroachment.  L5-S1: Status post posterolateral and interbody fusion.  There is no solid interbody fusion.  Both S1 screws are loose.  There is no residual neural compression.  IMPRESSION: Status post L2 through L4 fusion with intraoperative dural tear.  Large cerebrospinal fluid leak emanating from the L2-3 level on the left.  Moderate subcutaneous and deep dorsal seroma.  Slight mass effect on the thecal sac at L2-3 and L3-4 on the left, multifactorial.  Nonunion L5-S1.  Solid fusion L4-L5.   Original Report Authenticated By: Davonna Belling, M.D.    Dg Myelogram Lumbar  08/09/2012  *RADIOLOGY REPORT*  MYELOGRAM INJECTION  Technique:  Informed consent was obtained from the patient prior to the procedure, including potential complications of headache, allergy, infection and pain. Specific instructions were given regarding 24 hour bedrest post procedure to prevent post-LP headache.  A timeout procedure was performed.  With the patient prone, the lower back was prepped with Betadine.  1% Lidocaine was used for local anesthesia.  Lumbar puncture was performed by the radiologist at the L2-L3 level using a five inch  22 gauge needle with return of clear CSF. I personally performed the lumbar puncture and administered the intrathecal contrast. I also personally supervised acquisition of the myelogram images.  15 cc of Omnipaque 180 was injected into the subarachnoid space .  IMPRESSION: Successful injection of  intrathecal contrast for myelography.  MYELOGRAM LUMBAR  Clinical Data:  Intraoperative dural tear.  Postoperative CSF low pressure headaches.  Comparison: Intraoperative film 08/06/2012.  MRI 05/27/2012.  Findings: The lower third of the  patient's bandage was removed. Copious Betadine cleansing was performed and a 22 gauge 5 inch needle was placed to approximately 1 cm lateral to the incision at the L5 level and directed upward at an approximately 45 degree angle in the midline.  Cross-table lateral film was used to establish needle position depth.  Contrast injection showed mild stenosis at the L3-L4 level.  There was leakage of contrast from the thecal sac dorsally at L2-L3 seen best on series 15 image 1 (arrows).  This contrast was seen  to accumulate lateral to the left L3 pedicle screw on series 16 image 1 (arrows).  Fluoroscopy Time: 3.27 minutes  IMPRESSION: Findings consistent with a dorsal cerebrospinal fluid leak at L2-L3 on the left.  CT MYELOGRAPHY LUMBAR SPINE  Technique: Multidetector CT imaging of the lumbar spine was performed following myelography.  Multiplanar CT image reconstructions were also generated.  Findings:  Nonaneurysmal atherosclerotic calcification of the aorta.  L1-2: Normal.  L2-3: Status post posterolateral and interbody fusion.  Interbody cage good position.  Pedicle screws, as are appropriately placed although very slight medial extension of right L3 screw and both L2 screws.  There is a large cerebrospinal fluid leak emanating from the dorsolateral aspect of the thecal sac at L2-3 on the left (images 57-60, series 4). Marked contrast extravasation is noted. There is a moderate amount of air within the interspace and the surrounding the hardware.  There is slight mass effect on the thecal sac and left L3 nerve root.  There is contrast accumulation dorsal to the thecal sac in a seroma (32 x 60 mm cross-section opposite L4), and contrast extends into the fluid which has accumulated underneath the wound throughout its extent.  L3-4: Status post posterolateral and interbody fusion.  Interbody cage good position.  Pedicle screws in good position.  Small ventral epidural hematoma is eccentric to the left.  There may be  disc material and hematoma in the left neural foramen.  There is moderate mass effect on the thecal sac from the left.  L4-5: Status post remote posterolateral and interbody fusion. Interbody fusion is solid.  Pedicle screws appropriately positioned.  No residual neural encroachment.  L5-S1: Status post posterolateral and interbody fusion.  There is no solid interbody fusion.  Both S1 screws are loose.  There is no residual neural compression.  IMPRESSION: Status post L2 through L4 fusion with intraoperative dural tear.  Large cerebrospinal fluid leak emanating from the L2-3 level on the left.  Moderate subcutaneous and deep dorsal seroma.  Slight mass effect on the thecal sac at L2-3 and L3-4 on the left, multifactorial.  Nonunion L5-S1.  Solid fusion L4-L5.   Original Report Authenticated By: Davonna Belling, M.D.    Dg C-arm Gt 120 Min  08/06/2012  *RADIOLOGY REPORT*  Clinical Data: Back pain  DG C-ARM GT 120 MIN,LUMBAR SPINE - COMPLETE 4+ VIEW  Comparison: Multiple priors.  Findings: Prior fusion has been extended upward to involve L2-3 and L3-4 using pedicle screw and rod construct along with interbody cages.  Improved position and alignment.  IMPRESSION: As above.   Original Report Authenticated By: Davonna Belling, M.D.     Disposition: 01-Home or Self Care      Discharge Orders   Future Orders Complete By Expires     Call MD / Call 911  As directed     Comments:      If you experience chest pain or shortness of breath, CALL 911 and be transported to the hospital emergency room.  If you develope a fever above 101 F, pus (white drainage) or increased drainage or redness at the wound, or calf pain, call your surgeon's office.    Constipation Prevention  As directed     Comments:      Drink plenty of fluids.  Prune juice may be helpful.  You may use a stool softener, such as Colace (over the counter) 100 mg twice a day.  Use MiraLax (over the counter) for constipation as needed.    Diet - low  sodium  heart healthy  As directed     Discharge instructions  As directed     Comments:      Call if there is increasing drainage, fever greater than 101.5, severe head aches, and worsening nausea or light sensitivity. If shortness of breath, bloody cough or chest tightness or pain go to an emergency room. No lifting greater than 10 lbs. Avoid bending, stooping and twisting. Use brace when sitting and out of bed even to go to bathroom. Walk in house for first 2 weeks then may start to get out slowly increasing distances up to one mile by 4-6 weeks post op. After 5 days may shower and change dressing following bathing with shower.When bathing remove the brace shower and replace brace before getting out of the shower. If drainage, keep dry dressing and do not bathe the incision, use an moisture impervious dressing. Please call and return for scheduled follow up appointment 2 weeks from the time of surgery.    Driving restrictions  As directed     Comments:      No driving    Increase activity slowly as tolerated  As directed     Lifting restrictions  As directed     Comments:      No lifting       Follow-up Information   Follow up with NITKA,JAMES E, MD In 1 week.   Contact information:   [address] [place] [address] [phone]        Signed: Wende Neighbors 08/19/2012, 4:25 PM

## 2012-08-27 NOTE — Discharge Summary (Signed)
Patient 's Discharge Summary reviewed with Dondra Spry.

## 2012-11-01 ENCOUNTER — Ambulatory Visit: Payer: 59 | Admitting: Cardiovascular Disease

## 2012-11-05 ENCOUNTER — Ambulatory Visit: Payer: 59 | Admitting: Gastroenterology

## 2012-11-05 ENCOUNTER — Telehealth: Payer: Self-pay | Admitting: Gastroenterology

## 2012-11-05 NOTE — Telephone Encounter (Signed)
Do not bill 

## 2012-11-12 ENCOUNTER — Encounter: Payer: 59 | Admitting: Endocrinology

## 2012-11-19 ENCOUNTER — Encounter: Payer: 59 | Admitting: Endocrinology

## 2012-11-28 ENCOUNTER — Encounter: Payer: Self-pay | Admitting: Endocrinology

## 2012-11-28 ENCOUNTER — Ambulatory Visit (INDEPENDENT_AMBULATORY_CARE_PROVIDER_SITE_OTHER): Payer: 59 | Admitting: Endocrinology

## 2012-11-28 VITALS — BP 126/70 | HR 80 | Ht 71.0 in | Wt 178.0 lb

## 2012-11-28 DIAGNOSIS — Z0000 Encounter for general adult medical examination without abnormal findings: Secondary | ICD-10-CM

## 2012-11-28 DIAGNOSIS — E785 Hyperlipidemia, unspecified: Secondary | ICD-10-CM

## 2012-11-28 DIAGNOSIS — I10 Essential (primary) hypertension: Secondary | ICD-10-CM

## 2012-11-28 DIAGNOSIS — E119 Type 2 diabetes mellitus without complications: Secondary | ICD-10-CM

## 2012-11-28 DIAGNOSIS — Z79899 Other long term (current) drug therapy: Secondary | ICD-10-CM

## 2012-11-28 DIAGNOSIS — F17200 Nicotine dependence, unspecified, uncomplicated: Secondary | ICD-10-CM

## 2012-11-28 DIAGNOSIS — Z87891 Personal history of nicotine dependence: Secondary | ICD-10-CM

## 2012-11-28 DIAGNOSIS — D649 Anemia, unspecified: Secondary | ICD-10-CM

## 2012-11-28 LAB — HEPATIC FUNCTION PANEL
AST: 15 U/L (ref 0–37)
Albumin: 4.1 g/dL (ref 3.5–5.2)
Alkaline Phosphatase: 84 U/L (ref 39–117)
Bilirubin, Direct: 0 mg/dL (ref 0.0–0.3)
Total Protein: 7.5 g/dL (ref 6.0–8.3)

## 2012-11-28 LAB — IBC PANEL: Transferrin: 225.1 mg/dL (ref 212.0–360.0)

## 2012-11-28 LAB — LIPID PANEL
Cholesterol: 179 mg/dL (ref 0–200)
HDL: 32.7 mg/dL — ABNORMAL LOW (ref >39.00)
Triglycerides: 211 mg/dL — ABNORMAL HIGH (ref 0.0–149.0)
VLDL: 42.2 mg/dL — ABNORMAL HIGH (ref 0.0–40.0)

## 2012-11-28 LAB — CBC WITH DIFFERENTIAL/PLATELET
Basophils Relative: 0.2 % (ref 0.0–3.0)
Eosinophils Absolute: 0.2 10*3/uL (ref 0.0–0.7)
Eosinophils Relative: 2.4 % (ref 0.0–5.0)
Hemoglobin: 17.2 g/dL — ABNORMAL HIGH (ref 13.0–17.0)
Lymphocytes Relative: 24.4 % (ref 12.0–46.0)
MCHC: 34.2 g/dL (ref 30.0–36.0)
MCV: 94.4 fl (ref 78.0–100.0)
Neutro Abs: 5.3 10*3/uL (ref 1.4–7.7)
Neutrophils Relative %: 64.4 % (ref 43.0–77.0)
RBC: 5.33 Mil/uL (ref 4.22–5.81)
WBC: 8.2 10*3/uL (ref 4.5–10.5)

## 2012-11-28 LAB — URINALYSIS, ROUTINE W REFLEX MICROSCOPIC
Ketones, ur: NEGATIVE
Specific Gravity, Urine: >=1.03 (ref 1.000–1.030)
Total Protein, Urine: 100
Urine Glucose: NEGATIVE
Urobilinogen, UA: 0.2 (ref 0.0–1.0)

## 2012-11-28 LAB — BASIC METABOLIC PANEL
BUN: 17 mg/dL (ref 6–23)
CO2: 24 mEq/L (ref 19–32)
Chloride: 106 mEq/L (ref 96–112)
Creatinine, Ser: 1 mg/dL (ref 0.4–1.5)
Glucose, Bld: 126 mg/dL — ABNORMAL HIGH (ref 70–99)
Potassium: 3.2 mEq/L — ABNORMAL LOW (ref 3.5–5.1)

## 2012-11-28 LAB — HEMOGLOBIN A1C: Hgb A1c MFr Bld: 6.7 % — ABNORMAL HIGH (ref 4.6–6.5)

## 2012-11-28 LAB — MICROALBUMIN / CREATININE URINE RATIO: Creatinine,U: 156.2 mg/dL

## 2012-11-28 MED ORDER — TRAZODONE HCL 100 MG PO TABS: 100.0000 mg | ORAL_TABLET | Freq: Every day | ORAL | Status: DC

## 2012-11-28 NOTE — Progress Notes (Signed)
Subjective:    Patient ID: Jeff Lopez, male    DOB: 08-20-1950, 62 y.o.   MRN: [id number]  HPI Pt is here for regular wellness examination, and is feeling pretty well in general, and says chronic med probs are stable, except as noted below Past Medical History  Diagnosis Date  . HYPERLIPIDEMIA 04/09/2007  . HYPERTENSION, BENIGN 04/19/2010  . GERD 04/09/2007  . RENAL CYST 05/27/2010  . OSTEOARTHRITIS, LUMBAR SPINE 04/09/2007  . LUMBAR DISC DISORDER 05/27/2010  . CAD, NATIVE VESSEL 04/19/2010    nonobstructive by cath 2007:  oLAD 20-30%, mLAD 50%, pCFX 20-30%, oAVCFX 20-30%, L renal art 50%;  normal LVF  . [id number](478.2)     Past Surgical History  Procedure Laterality Date  . Spine surgery  2006    C-spine surgery x 2  . Back surgery    . Neck surgery    . Lumbar disc surgery  08/06/2012    L3 & L4  . Foot fracture surgery    . Lumbar laminectomy/decompression microdiscectomy Left 08/10/2012    Procedure: Dura Repair Left Side L2-L3;  Surgeon: Kerrin Champagne, MD;  Location: [facility] OR;  Service: Orthopedics;  Laterality: Left;  Wilson Frame, Sliding table, dura repair kit, microscope    History   Social History  . Marital Status: Single    Spouse Name: N/A    Number of Children: 1  . Years of Education: N/A   Occupational History  . [employer]    Social History Main Topics  . Smoking status: Former Smoker -- 0.50 packs/day for 44 years    Types: Cigarettes    Quit date: 04/06/2012  . Smokeless tobacco: Never Used     Comment: pt has stopped smoking about 9 months now  . Alcohol Use: No  . Drug Use: No  . Sexually Active: Not Currently   Other Topics Concern  . Not on file   Social History Narrative   Works in Set designer.    Current Outpatient Prescriptions on File Prior to Visit  Medication Sig Dispense Refill  . losartan-hydrochlorothiazide (HYZAAR) 100-12.5 MG per tablet take 1 tablet by mouth daily BACK ON BLOOD PRESSURE MEDS  30 tablet  8  .  metoprolol succinate (TOPROL-XL) 25 MG 24 hr tablet take 1 tablet by mouth once daily  30 tablet  8  . pantoprazole (PROTONIX) 40 MG tablet take 1 tablet by mouth twice a day BEFOREA MEAL  60 tablet  11   No current facility-administered medications on file prior to visit.    Allergies  Allergen Reactions  . Ceftin Other (See Comments)    Patient stated it caused "sores in mouth"    Family History  Problem Relation Age of Onset  . Cancer Mother     Breast Cancer, Uterine Cancer  . Breast cancer Mother   . Uterine cancer Mother   . Hypertension Brother   . Heart disease Mother     BP 126/70  Pulse 80  Ht 5\' 11"  (1.803 m)  Wt 178 lb (80.74 kg)  BMI 24.84 kg/m2  SpO2 98%     Review of Systems  Constitutional: Negative for fever.  HENT: Negative for hearing loss.   Eyes: Negative for visual disturbance.  Respiratory: Negative for shortness of breath.   Cardiovascular: Negative for chest pain.  Endocrine: Negative for cold intolerance.  Genitourinary: Negative for difficulty urinating.  Musculoskeletal: Negative for gait problem.  Skin: Negative for rash.  Allergic/Immunologic: Positive for environmental allergies.  Neurological:  Negative for syncope.  Hematological: Does not bruise/bleed easily.  Psychiatric/Behavioral: Negative for dysphoric mood.       Objective:   Physical Exam VS: see vs page GEN: no distress HEAD: head: no deformity eyes: no periorbital swelling, no proptosis external nose and ears are normal mouth: no lesion seen NECK: Neck: a healed scar (c-spine surgery) is present.  i do not appreciate a nodule in the thyroid or elsewhere in the neck CHEST WALL: no deformity LUNGS: clear to auscultation. BREASTS:  No gynecomastia.   ABD: abdomen is soft, nontender.  no hepatosplenomegaly.  not distended.  no hernia RECTAL: normal external and internal exam.  heme neg. PROSTATE:  Normal size.  No nodule MUSCULOSKELETAL: muscle bulk and strength are  grossly normal.  no obvious joint swelling.  gait is normal and steady.  Old healed surgical scar, at the lower back. PULSES: no carotid bruit NEURO:  cn 2-12 grossly intact.   readily moves all 4's.  SKIN:  Normal texture and temperature.  No rash or suspicious lesion is visible.   NODES:  None palpable at the neck PSYCH: alert, oriented x3.  Does not appear anxious nor depressed.        Assessment & Plan:  Wellness visit today, with problems stable, except as noted.    SEPARATE EVALUATION FOLLOWS--EACH PROBLEM HERE IS NEW, NOT RESPONDING TO TREATMENT, OR POSES SIGNIFICANT RISK TO [name] HEALTH: HISTORY OF THE PRESENT ILLNESS: The state of at least three ongoing medical problems is addressed today, with interval history of each noted here: Insomnia persists.  unisom does not help.   Anemia: this was noted after recent surgery.  Denies brbpr Dyslipidemia: He has lost a few lbs, due to his efforts.  PAST MEDICAL HISTORY reviewed and up to date today REVIEW OF SYSTEMS: He denies numbness and hematuria PHYSICAL EXAMINATION: VITAL SIGNS:  See vs page GENERAL: no distress HEART:  Regular rate and rhythm without murmurs noted. Normal S1,S2.   LAB/XRAY RESULTS: Lab Results  Component Value Date   WBC 8.2 11/28/2012   HGB 17.2* 11/28/2012   HCT 50.3 11/28/2012   PLT 208.0 11/28/2012   GLUCOSE 126* 11/28/2012   CHOL 179 11/28/2012   TRIG 211.0* 11/28/2012   HDL 32.70* 11/28/2012   LDLDIRECT 115.6 11/28/2012   LDLCALC 112* 02/06/2012   ALT 16 11/28/2012   AST 15 11/28/2012   NA 143 11/28/2012   K 3.2* 11/28/2012   CL 106 11/28/2012   CREATININE 1.0 11/28/2012   BUN 17 11/28/2012   CO2 24 11/28/2012   TSH 0.48 11/28/2012   PSA 1.15 07/26/2011   INR 0.86 01/25/2011   HGBA1C 6.7* 11/28/2012   MICROALBUR 36.5* 11/28/2012  IMPRESSION: Anemia, resolved Dyslipidemia: he needs increased rx Insomnia: he needs increased rx PLAN: See instruction page

## 2012-11-28 NOTE — Patient Instructions (Addendum)
i have sent a prescription to your pharmacy, for the sleep.   please consider these measures for your health:  minimize alcohol.  do not use tobacco products.  have a colonoscopy at least every 10 years from age 61.  keep firearms safely stored.  always use seat belts.  have working smoke alarms in your home.  see an eye doctor and dentist regularly.  never drive under the influence of alcohol or drugs (including prescription drugs).  those with fair skin should take precautions against the sun.   Please return in 1 year.

## 2012-11-29 ENCOUNTER — Ambulatory Visit (INDEPENDENT_AMBULATORY_CARE_PROVIDER_SITE_OTHER): Payer: 59 | Admitting: Gastroenterology

## 2012-11-29 ENCOUNTER — Encounter: Payer: Self-pay | Admitting: Gastroenterology

## 2012-11-29 VITALS — BP 122/90 | HR 80 | Ht 69.25 in | Wt 180.4 lb

## 2012-11-29 DIAGNOSIS — K219 Gastro-esophageal reflux disease without esophagitis: Secondary | ICD-10-CM

## 2012-11-29 MED ORDER — ATORVASTATIN CALCIUM 10 MG PO TABS: 10.0000 mg | ORAL_TABLET | Freq: Every day | ORAL | Status: DC

## 2012-11-29 NOTE — Patient Instructions (Addendum)
Continue PPI (pantoprazole once to twice daily for your GERD). This is available in OTC form which is just as good as brand name. These are best taken 20-30 min prior to a meal.

## 2012-11-29 NOTE — Addendum Note (Signed)
Addended by: Romero Belling on: 11/29/2012 12:39 PM   Modules accepted: Orders

## 2012-11-29 NOTE — Progress Notes (Signed)
Review of pertinent gastrointestinal problems:  1. GERD, intermittent dysphagia that is likely GERD related. EGD February 2010 found 2 cm hiatal hernia, no stricture, slightly irregular Z line that was biopsied and biopsies showed no sign of intestinal metaplasia.  2. Adenomatous polyps: colonoscopy with SML in 03/2004 found 7mm HP polyp. Repeat colonoscopy 2013 (Tesean Stump), done for new bleeding found two polyps, hemorrhoids.  Polyps were adenomas on pathology.  Recommended recall colonoscopy at 5 year interval.   HPI: This is a  very pleasant 62 year old man whom I last saw about a year ago the time of a colonoscopy.  Chocolate and peppers will cause worse pyrosis.  He takes proton X one to 2 pills daily.   He was laid off 06/25/2012, all his insurance runs out the end of this month.  He may use cobra to stick with same plan for another 18 months.  No dysphagia.  Has lost a bit of weight intentionally.  No overt GI bleeding.   Past Medical History  Diagnosis Date  . HYPERLIPIDEMIA 04/09/2007  . HYPERTENSION, BENIGN 04/19/2010  . GERD 04/09/2007  . RENAL CYST 05/27/2010  . OSTEOARTHRITIS, LUMBAR SPINE 04/09/2007  . LUMBAR DISC DISORDER 05/27/2010  . CAD, NATIVE VESSEL 04/19/2010    nonobstructive by cath 2007:  oLAD 20-30%, mLAD 50%, pCFX 20-30%, oAVCFX 20-30%, L renal art 50%;  normal LVF  . [id number](562.1)     Past Surgical History  Procedure Laterality Date  . Spine surgery  2006    C-spine surgery x 2  . Back surgery    . Neck surgery    . Lumbar disc surgery  08/06/2012    L3 & L4  . Foot fracture surgery    . Lumbar laminectomy/decompression microdiscectomy Left 08/10/2012    Procedure: Dura Repair Left Side L2-L3;  Surgeon: Kerrin Champagne, MD;  Location: [facility] OR;  Service: Orthopedics;  Laterality: Left;  Wilson Frame, Sliding table, dura repair kit, microscope    Current Outpatient Prescriptions  Medication Sig Dispense Refill  . losartan-hydrochlorothiazide (HYZAAR)  100-12.5 MG per tablet take 1 tablet by mouth daily BACK ON BLOOD PRESSURE MEDS  30 tablet  8  . metoprolol succinate (TOPROL-XL) 25 MG 24 hr tablet take 1 tablet by mouth once daily  30 tablet  8  . pantoprazole (PROTONIX) 40 MG tablet take 1 tablet by mouth twice a day BEFOREA MEAL  60 tablet  11  . traZODone (DESYREL) 100 MG tablet Take 1 tablet (100 mg total) by mouth at bedtime.  30 tablet  11   No current facility-administered medications for this visit.    Allergies as of 11/29/2012 - Review Complete 11/29/2012  Allergen Reaction Noted  . Ceftin Other (See Comments) 07/31/2011    Family History  Problem Relation Age of Onset  . Cancer Mother     Breast Cancer, Uterine Cancer  . Breast cancer Mother   . Uterine cancer Mother   . Hypertension Brother   . Heart disease Mother     History   Social History  . Marital Status: Single    Spouse Name: N/A    Number of Children: 1  . Years of Education: N/A   Occupational History  . [employer]    Social History Main Topics  . Smoking status: Former Smoker -- 0.50 packs/day for 44 years    Types: Cigarettes    Quit date: 04/06/2012  . Smokeless tobacco: Never Used     Comment: pt has stopped smoking about  9 months now  . Alcohol Use: No  . Drug Use: No  . Sexually Active: Not Currently   Other Topics Concern  . Not on file   Social History Narrative   Works in Set designer.      Physical Exam: BP 122/90  Pulse 80  Ht 5' 9.25" (1.759 m)  Wt 180 lb 6 oz (81.818 kg)  BMI 26.44 kg/m2 Constitutional: generally well-appearing Psychiatric: alert and oriented x3 Abdomen: soft, nontender, nondistended, no obvious ascites, no peritoneal signs, normal bowel sounds     Assessment and plan: 62 y.o. male with GERD, under good control on proton pump inhibitor  He has no alarm symptoms. I recommended he continue proton pump inhibitor once to twice daily. I explained to him that over-the-counter, generic proton  pump inhibitors are usually just as effective at the prescription strength medicines.

## 2013-01-13 ENCOUNTER — Ambulatory Visit: Payer: 59 | Admitting: Cardiovascular Disease

## 2013-03-12 ENCOUNTER — Other Ambulatory Visit: Payer: Self-pay | Admitting: Physician Assistant

## 2013-03-12 DIAGNOSIS — M545 Low back pain, unspecified: Secondary | ICD-10-CM

## 2013-03-14 ENCOUNTER — Ambulatory Visit
Admission: RE | Admit: 2013-03-14 | Discharge: 2013-03-14 | Disposition: A | Discharge status: Home / Self Care | Payer: 59 | Source: Ambulatory Visit | Attending: Physician Assistant | Admitting: Physician Assistant

## 2013-03-14 DIAGNOSIS — M545 Low back pain, unspecified: Secondary | ICD-10-CM

## 2013-04-11 ENCOUNTER — Encounter: Payer: Self-pay | Admitting: Endocrinology

## 2013-05-20 ENCOUNTER — Other Ambulatory Visit: Payer: Self-pay | Admitting: Cardiovascular Disease

## 2013-05-30 ENCOUNTER — Ambulatory Visit
Admission: RE | Admit: 2013-05-30 | Discharge: 2013-05-30 | Disposition: A | Discharge status: Home / Self Care | Payer: 59 | Source: Ambulatory Visit | Attending: Endocrinology | Admitting: Endocrinology

## 2013-05-30 ENCOUNTER — Encounter: Payer: Self-pay | Admitting: *Deleted

## 2013-05-30 ENCOUNTER — Encounter: Payer: Self-pay | Admitting: Endocrinology

## 2013-05-30 ENCOUNTER — Ambulatory Visit (INDEPENDENT_AMBULATORY_CARE_PROVIDER_SITE_OTHER): Payer: 59 | Admitting: Endocrinology

## 2013-05-30 VITALS — BP 110/64 | HR 53 | Temp 98.0°F | Ht 69.25 in | Wt 178.3 lb

## 2013-05-30 DIAGNOSIS — D649 Anemia, unspecified: Secondary | ICD-10-CM

## 2013-05-30 DIAGNOSIS — E119 Type 2 diabetes mellitus without complications: Secondary | ICD-10-CM

## 2013-05-30 DIAGNOSIS — E785 Hyperlipidemia, unspecified: Secondary | ICD-10-CM

## 2013-05-30 DIAGNOSIS — M542 Cervicalgia: Secondary | ICD-10-CM

## 2013-05-30 LAB — CBC WITH DIFFERENTIAL/PLATELET
Basophils Absolute: 0 10*3/uL (ref 0.0–0.1)
Basophils Relative: 0.2 % (ref 0.0–3.0)
Eosinophils Absolute: 0.1 10*3/uL (ref 0.0–0.7)
Hemoglobin: 14.2 g/dL (ref 13.0–17.0)
Lymphocytes Relative: 17.7 % (ref 12.0–46.0)
MCHC: 33.8 g/dL (ref 30.0–36.0)
Monocytes Relative: 5.7 % (ref 3.0–12.0)
Neutrophils Relative %: 75.3 % (ref 43.0–77.0)
RBC: 4.39 Mil/uL (ref 4.22–5.81)
RDW: 13.2 % (ref 11.5–14.6)

## 2013-05-30 LAB — LIPID PANEL
Cholesterol: 145 mg/dL (ref 0–200)
LDL Cholesterol: 89 mg/dL (ref 0–99)
Total CHOL/HDL Ratio: 3
VLDL: 13.8 mg/dL (ref 0.0–40.0)

## 2013-05-30 LAB — IBC PANEL
Saturation Ratios: 48 % (ref 20.0–50.0)
Transferrin: 201 mg/dL — ABNORMAL LOW (ref 212.0–360.0)

## 2013-05-30 MED ORDER — TRAZODONE HCL 300 MG PO TABS: 300.0000 mg | ORAL_TABLET | Freq: Every day | ORAL | Status: DC

## 2013-05-30 NOTE — Patient Instructions (Addendum)
blood tests and x-rays are being requested for you today.  We'll contact you with results.   Please come back for a regular physical appointment in 6 months (after 11/28/13). i have sent a prescription to your pharmacy, to increase the trazodone.

## 2013-05-30 NOTE — Progress Notes (Signed)
Subjective:    Patient ID: Jeff Lopez, male    DOB: 08-04-1950, 62 y.o.   MRN: [id number]  HPI The state of at least three ongoing medical problems is addressed today, with interval history of each noted here: Pt returns for f/u of type 2 DM (dx'ed 2011; complicated by CAD; he has never required medication for this).  He reports fatigue.   Pt reports of moderate pain at the left posterior neck. He says low-back pain persists.   desyrel doe not help insomnia.   He has never taken fe tabs.  Past Medical History  Diagnosis Date  . HYPERLIPIDEMIA 04/09/2007  . HYPERTENSION, BENIGN 04/19/2010  . GERD 04/09/2007  . RENAL CYST 05/27/2010  . OSTEOARTHRITIS, LUMBAR SPINE 04/09/2007  . LUMBAR DISC DISORDER 05/27/2010  . CAD, NATIVE VESSEL 04/19/2010    nonobstructive by cath 2007:  oLAD 20-30%, mLAD 50%, pCFX 20-30%, oAVCFX 20-30%, L renal art 50%;  normal LVF  . [id number](478.2)     Past Surgical History  Procedure Laterality Date  . Spine surgery  2006    C-spine surgery x 2  . Back surgery    . Neck surgery    . Lumbar disc surgery  08/06/2012    L3 & L4  . Foot fracture surgery    . Lumbar laminectomy/decompression microdiscectomy Left 08/10/2012    Procedure: Dura Repair Left Side L2-L3;  Surgeon: Kerrin Champagne, MD;  Location: [facility] OR;  Service: Orthopedics;  Laterality: Left;  Wilson Frame, Sliding table, dura repair kit, microscope    History   Social History  . Marital Status: Single    Spouse Name: N/A    Number of Children: 1  . Years of Education: N/A   Occupational History  . [employer]    Social History Main Topics  . Smoking status: Former Smoker -- 0.50 packs/day for 44 years    Types: Cigarettes    Quit date: 04/06/2012  . Smokeless tobacco: Never Used     Comment: pt has stopped smoking about 9 months now  . Alcohol Use: No  . Drug Use: No  . Sexual Activity: Not Currently   Other Topics Concern  . Not on file   Social History Narrative   Works in Set designer.    Current Outpatient Prescriptions on File Prior to Visit  Medication Sig Dispense Refill  . atorvastatin (LIPITOR) 10 MG tablet Take 1 tablet (10 mg total) by mouth daily.  90 tablet  3  . losartan-hydrochlorothiazide (HYZAAR) 100-12.5 MG per tablet take 1 tablet by mouth once daily BACK ON BLOOD PRESSURE MEDS  30 tablet  1  . metoprolol succinate (TOPROL-XL) 25 MG 24 hr tablet take 1 tablet by mouth once daily  30 tablet  1  . pantoprazole (PROTONIX) 40 MG tablet take 1 tablet by mouth twice a day BEFOREA MEAL  60 tablet  11   No current facility-administered medications on file prior to visit.    Allergies  Allergen Reactions  . Ceftin Other (See Comments)    Patient stated it caused "sores in mouth"    Family History  Problem Relation Age of Onset  . Cancer Mother     Breast Cancer, Uterine Cancer  . Breast cancer Mother   . Uterine cancer Mother   . Hypertension Brother   . Heart disease Mother    BP 110/64  Pulse 53  Temp(Src) 98 F (36.7 C) (Oral)  Ht 5' 9.25" (1.759 m)  Wt 178 lb  5 oz (80.882 kg)  BMI 26.14 kg/m2  SpO2 94%  Review of Systems Denies numbness and weight change.     Objective:   Physical Exam VITAL SIGNS:  See vs page GENERAL: no distress Neck: full rom, but rom is painful.    Lab Results  Component Value Date   WBC 5.0 05/30/2013   HGB 14.2 05/30/2013   HCT 41.9 05/30/2013   PLT 186.0 05/30/2013   GLUCOSE 126* 11/28/2012   CHOL 145 05/30/2013   TRIG 69.0 05/30/2013   HDL 42.00 05/30/2013   LDLDIRECT 115.6 11/28/2012   LDLCALC 89 05/30/2013   ALT 16 11/28/2012   AST 15 11/28/2012   NA 143 11/28/2012   K 3.2* 11/28/2012   CL 106 11/28/2012   CREATININE 1.0 11/28/2012   BUN 17 11/28/2012   CO2 24 11/28/2012   TSH 0.48 11/28/2012   PSA 1.15 07/26/2011   INR 0.86 01/25/2011   HGBA1C 6.4 05/30/2013   MICROALBUR 36.5* 11/28/2012      Assessment & Plan:  DM: well-controlled on no medication. Anemia, resolved. Insomnia: he needs increased  rx Neck pain: prod due to OA.

## 2013-06-17 ENCOUNTER — Ambulatory Visit (INDEPENDENT_AMBULATORY_CARE_PROVIDER_SITE_OTHER): Payer: 59 | Admitting: Cardiovascular Disease

## 2013-06-17 ENCOUNTER — Encounter: Payer: Self-pay | Admitting: Cardiovascular Disease

## 2013-06-17 VITALS — BP 126/70 | HR 60 | Ht 71.0 in | Wt 178.0 lb

## 2013-06-17 DIAGNOSIS — I2510 Atherosclerotic heart disease of native coronary artery without angina pectoris: Secondary | ICD-10-CM

## 2013-06-17 MED ORDER — ASPIRIN EC 81 MG PO TBEC: 81.0000 mg | DELAYED_RELEASE_TABLET | Freq: Every day | ORAL | Status: DC

## 2013-06-17 NOTE — Progress Notes (Signed)
    HPI:  62 year old gentleman presenting for followup evaluation. The patient has a history of nonobstructive coronary artery disease and has been managed medically. The patient had cardiac catheterization in 2007 with 50% stenosis of the LAD noted. He has been managed medically. He also is followed for hypertension. Myoview scan was done in 2012. This demonstrated a gated left ventricular ejection fraction of 61% and inferior and apical thinning likely consistent with attenuation artifact. Perfusion was otherwise normal. Overall interpretation was low risk. A carotid duplex scan was done about one year ago for evaluation of pulsatile tinnitus. This showed no significant obstructive disease on either side. Lipids from December 5 showed cholesterol 145, triglycerides 69, HDL 42, and LDL 89.  From a cardiac perspective he's been doing fine. He denies chest pain, chest pressure, shortness of breath, edema, or palpitations. He continues to have a difficult time with his low back. He had surgery in February. He still has leg weakness and low back pain. He is undergoing some imaging studies in the near future.   Outpatient Encounter Prescriptions as of 06/17/2013  Medication Sig  . atorvastatin (LIPITOR) 10 MG tablet Take 1 tablet (10 mg total) by mouth daily.  [name] losartan-hydrochlorothiazide (HYZAAR) 100-12.5 MG per tablet take 1 tablet by mouth once daily BACK ON BLOOD PRESSURE MEDS  . metoprolol succinate (TOPROL-XL) 25 MG 24 hr tablet take 1 tablet by mouth once daily  . OXYCONTIN 20 MG T12A 12 hr tablet   . pantoprazole (PROTONIX) 40 MG tablet take 1 tablet by mouth twice a day BEFOREA MEAL  . [DISCONTINUED] trazodone (DESYREL) 300 MG tablet Take 1 tablet (300 mg total) by mouth at bedtime.    Allergies  Allergen Reactions  . Ceftin Other (See Comments)    Patient stated it caused "sores in mouth"    Past Medical History  Diagnosis Date  . HYPERLIPIDEMIA 04/09/2007  . HYPERTENSION, BENIGN  04/19/2010  . GERD 04/09/2007  . RENAL CYST 05/27/2010  . OSTEOARTHRITIS, LUMBAR SPINE 04/09/2007  . LUMBAR DISC DISORDER 05/27/2010  . CAD, NATIVE VESSEL 04/19/2010    nonobstructive by cath 2007:  oLAD 20-30%, mLAD 50%, pCFX 20-30%, oAVCFX 20-30%, L renal art 50%;  normal LVF  . Headache(784.0)     ROS: Negative except as per HPI  BP 126/70  Pulse 60  Ht 5\' 11"  (1.803 m)  Wt 178 lb (80.74 kg)  BMI 24.84 kg/m2  PHYSICAL EXAM: Pt is alert and oriented, NAD HEENT: normal Neck: JVP - normal, carotids 2+= without bruits Lungs: CTA bilaterally CV: RRR without murmur or gallop Abd: soft, NT, Positive BS, no hepatomegaly Ext: no C/C/E, distal pulses intact and equal Skin: warm/dry no rash  EKG:  Sinus rhythm with occasional PVCs, nonspecific ST abnormality.  ASSESSMENT AND PLAN: 1. Coronary artery disease, native vessel. He remains stable without symptoms of angina. I asked him to add aspirin 81 mg to his daily medical regimen. He otherwise will continue on his current program without changes. He will return in one year for followup.  2. Hypertension. Blood pressure control is ideal. He remains on losartan, hydrochlorothiazide, and metoprolol succinate  3. Hyperlipidemia. Lipids at goal as above on atorvastatin 10 mg daily  4. Tobacco. He is approaching his 1 year anniversary of smoking cessation.  Tonny Bollman 06/17/2013 9:05 AM

## 2013-06-17 NOTE — Patient Instructions (Signed)
Your physician wants you to follow-up in: 1 year with [name].  You will receive a reminder letter in the mail two months in advance. If you don't receive a letter, please call our office to schedule the follow-up appointment.  Your physician has recommended you make the following change in your medication:  START Aspirin 81 mg

## 2013-06-18 ENCOUNTER — Other Ambulatory Visit: Payer: Self-pay | Admitting: Specialist

## 2013-06-18 DIAGNOSIS — M545 Low back pain, unspecified: Secondary | ICD-10-CM

## 2013-06-25 ENCOUNTER — Ambulatory Visit
Admission: RE | Admit: 2013-06-25 | Discharge: 2013-06-25 | Disposition: A | Discharge status: Home / Self Care | Payer: 59 | Source: Ambulatory Visit | Attending: Specialist | Admitting: Specialist

## 2013-06-25 DIAGNOSIS — M545 Low back pain, unspecified: Secondary | ICD-10-CM

## 2013-07-12 ENCOUNTER — Encounter (HOSPITAL_COMMUNITY): Payer: Self-pay | Admitting: Emergency Medicine

## 2013-07-12 ENCOUNTER — Inpatient Hospital Stay (HOSPITAL_COMMUNITY)
Admission: EM | Admit: 2013-07-12 | Discharge: 2013-07-15 | DRG: 419 | Disposition: A | Discharge status: Home / Self Care | Payer: BC Managed Care – PPO | Attending: Internal Medicine | Admitting: Internal Medicine

## 2013-07-12 ENCOUNTER — Emergency Department (HOSPITAL_COMMUNITY): Payer: BC Managed Care – PPO

## 2013-07-12 ENCOUNTER — Inpatient Hospital Stay (HOSPITAL_COMMUNITY): Payer: BC Managed Care – PPO

## 2013-07-12 DIAGNOSIS — M1990 Unspecified osteoarthritis, unspecified site: Secondary | ICD-10-CM | POA: Diagnosis present

## 2013-07-12 DIAGNOSIS — K8000 Calculus of gallbladder with acute cholecystitis without obstruction: Secondary | ICD-10-CM

## 2013-07-12 DIAGNOSIS — E119 Type 2 diabetes mellitus without complications: Secondary | ICD-10-CM | POA: Diagnosis present

## 2013-07-12 DIAGNOSIS — K8050 Calculus of bile duct without cholangitis or cholecystitis without obstruction: Secondary | ICD-10-CM

## 2013-07-12 DIAGNOSIS — R1110 Vomiting, unspecified: Secondary | ICD-10-CM

## 2013-07-12 DIAGNOSIS — M519 Unspecified thoracic, thoracolumbar and lumbosacral intervertebral disc disorder: Secondary | ICD-10-CM | POA: Diagnosis present

## 2013-07-12 DIAGNOSIS — J4489 Other specified chronic obstructive pulmonary disease: Secondary | ICD-10-CM | POA: Diagnosis present

## 2013-07-12 DIAGNOSIS — R6889 Other general symptoms and signs: Secondary | ICD-10-CM | POA: Diagnosis present

## 2013-07-12 DIAGNOSIS — Z791 Long term (current) use of non-steroidal anti-inflammatories (NSAID): Secondary | ICD-10-CM

## 2013-07-12 DIAGNOSIS — Z7982 Long term (current) use of aspirin: Secondary | ICD-10-CM

## 2013-07-12 DIAGNOSIS — I4949 Other premature depolarization: Secondary | ICD-10-CM | POA: Diagnosis present

## 2013-07-12 DIAGNOSIS — Z87891 Personal history of nicotine dependence: Secondary | ICD-10-CM

## 2013-07-12 DIAGNOSIS — Z79899 Other long term (current) drug therapy: Secondary | ICD-10-CM

## 2013-07-12 DIAGNOSIS — K219 Gastro-esophageal reflux disease without esophagitis: Secondary | ICD-10-CM | POA: Diagnosis present

## 2013-07-12 DIAGNOSIS — E1143 Type 2 diabetes mellitus with diabetic autonomic (poly)neuropathy: Secondary | ICD-10-CM | POA: Diagnosis present

## 2013-07-12 DIAGNOSIS — I2510 Atherosclerotic heart disease of native coronary artery without angina pectoris: Secondary | ICD-10-CM | POA: Diagnosis present

## 2013-07-12 DIAGNOSIS — J449 Chronic obstructive pulmonary disease, unspecified: Secondary | ICD-10-CM | POA: Diagnosis present

## 2013-07-12 DIAGNOSIS — E876 Hypokalemia: Secondary | ICD-10-CM | POA: Diagnosis not present

## 2013-07-12 DIAGNOSIS — Z881 Allergy status to other antibiotic agents status: Secondary | ICD-10-CM

## 2013-07-12 DIAGNOSIS — R7989 Other specified abnormal findings of blood chemistry: Secondary | ICD-10-CM | POA: Diagnosis present

## 2013-07-12 DIAGNOSIS — D72829 Elevated white blood cell count, unspecified: Secondary | ICD-10-CM | POA: Diagnosis present

## 2013-07-12 DIAGNOSIS — M47817 Spondylosis without myelopathy or radiculopathy, lumbosacral region: Secondary | ICD-10-CM | POA: Diagnosis present

## 2013-07-12 DIAGNOSIS — R197 Diarrhea, unspecified: Secondary | ICD-10-CM

## 2013-07-12 DIAGNOSIS — I10 Essential (primary) hypertension: Secondary | ICD-10-CM

## 2013-07-12 DIAGNOSIS — R109 Unspecified abdominal pain: Secondary | ICD-10-CM | POA: Diagnosis present

## 2013-07-12 DIAGNOSIS — Z8249 Family history of ischemic heart disease and other diseases of the circulatory system: Secondary | ICD-10-CM

## 2013-07-12 DIAGNOSIS — R932 Abnormal findings on diagnostic imaging of liver and biliary tract: Secondary | ICD-10-CM

## 2013-07-12 DIAGNOSIS — K8062 Calculus of gallbladder and bile duct with acute cholecystitis without obstruction: Principal | ICD-10-CM | POA: Diagnosis present

## 2013-07-12 DIAGNOSIS — E785 Hyperlipidemia, unspecified: Secondary | ICD-10-CM | POA: Diagnosis present

## 2013-07-12 LAB — URINALYSIS, ROUTINE W REFLEX MICROSCOPIC
BILIRUBIN URINE: NEGATIVE
Glucose, UA: NEGATIVE mg/dL
KETONES UR: 15 mg/dL — AB
Leukocytes, UA: NEGATIVE
NITRITE: NEGATIVE
Protein, ur: 100 mg/dL — AB
Specific Gravity, Urine: 1.023 (ref 1.005–1.030)
Urobilinogen, UA: 0.2 mg/dL (ref 0.0–1.0)
pH: 6.5 (ref 5.0–8.0)

## 2013-07-12 LAB — CBC WITH DIFFERENTIAL/PLATELET
BASOS PCT: 0 % (ref 0–1)
Basophils Absolute: 0 10*3/uL (ref 0.0–0.1)
Eosinophils Absolute: 0 10*3/uL (ref 0.0–0.7)
Eosinophils Relative: 0 % (ref 0–5)
HEMATOCRIT: 42 % (ref 39.0–52.0)
Hemoglobin: 15.4 g/dL (ref 13.0–17.0)
Lymphocytes Relative: 7 % — ABNORMAL LOW (ref 12–46)
Lymphs Abs: 1.2 10*3/uL (ref 0.7–4.0)
MCH: 33.4 pg (ref 26.0–34.0)
MCHC: 36.7 g/dL — AB (ref 30.0–36.0)
MCV: 91.1 fL (ref 78.0–100.0)
MONO ABS: 0.9 10*3/uL (ref 0.1–1.0)
MONOS PCT: 5 % (ref 3–12)
NEUTROS ABS: 15 10*3/uL — AB (ref 1.7–7.7)
Neutrophils Relative %: 88 % — ABNORMAL HIGH (ref 43–77)
Platelets: 219 10*3/uL (ref 150–400)
RBC: 4.61 MIL/uL (ref 4.22–5.81)
RDW: 13.1 % (ref 11.5–15.5)
WBC: 17.1 10*3/uL — ABNORMAL HIGH (ref 4.0–10.5)

## 2013-07-12 LAB — MAGNESIUM: Magnesium: 1.5 mg/dL (ref 1.5–2.5)

## 2013-07-12 LAB — POCT I-STAT TROPONIN I
Troponin i, poc: 0.01 ng/mL (ref 0.00–0.08)
Troponin i, poc: 0.01 ng/mL (ref 0.00–0.08)

## 2013-07-12 LAB — COMPREHENSIVE METABOLIC PANEL
ALBUMIN: 4.3 g/dL (ref 3.5–5.2)
ALT: 14 U/L (ref 0–53)
AST: 13 U/L (ref 0–37)
Alkaline Phosphatase: 78 U/L (ref 39–117)
BILIRUBIN TOTAL: 1.3 mg/dL — AB (ref 0.3–1.2)
BUN: 20 mg/dL (ref 6–23)
CO2: 25 meq/L (ref 19–32)
CREATININE: 0.98 mg/dL (ref 0.50–1.35)
Calcium: 9.4 mg/dL (ref 8.4–10.5)
Chloride: 96 mEq/L (ref 96–112)
GFR calc Af Amer: >90 mL/min (ref >90)
GFR, EST NON AFRICAN AMERICAN: 87 mL/min — AB (ref >90)
Glucose, Bld: 179 mg/dL — ABNORMAL HIGH (ref 70–99)
Potassium: 2.7 mEq/L — CL (ref 3.7–5.3)
Sodium: 139 mEq/L (ref 137–147)
Total Protein: 6.8 g/dL (ref 6.0–8.3)

## 2013-07-12 LAB — URINE MICROSCOPIC-ADD ON

## 2013-07-12 LAB — POCT I-STAT, CHEM 8
BUN: 12 mg/dL (ref 6–23)
Calcium, Ion: 1.21 mmol/L (ref 1.13–1.30)
Chloride: 102 mEq/L (ref 96–112)
Creatinine, Ser: 1.3 mg/dL (ref 0.50–1.35)
Glucose, Bld: 94 mg/dL (ref 70–99)
HCT: 57 % — ABNORMAL HIGH (ref 39.0–52.0)
Hemoglobin: 19.4 g/dL — ABNORMAL HIGH (ref 13.0–17.0)
POTASSIUM: 3.7 meq/L (ref 3.7–5.3)
SODIUM: 143 meq/L (ref 137–147)
TCO2: 26 mmol/L (ref 0–100)

## 2013-07-12 LAB — TROPONIN I

## 2013-07-12 LAB — LIPASE, BLOOD: LIPASE: 32 U/L (ref 11–59)

## 2013-07-12 MED ORDER — ASPIRIN EC 81 MG PO TBEC
81.0000 mg | DELAYED_RELEASE_TABLET | Freq: Every day | ORAL | Status: DC
  Administered 2013-07-12 – 2013-07-15 (×4): 81 mg via ORAL
  Filled 2013-07-12 (×4): qty 1

## 2013-07-12 MED ORDER — IOHEXOL 300 MG/ML  SOLN
100.0000 mL | Freq: Once | INTRAMUSCULAR | Status: AC | PRN
  Administered 2013-07-12: 100 mL via INTRAVENOUS

## 2013-07-12 MED ORDER — METOPROLOL SUCCINATE ER 25 MG PO TB24
25.0000 mg | ORAL_TABLET | Freq: Every day | ORAL | Status: DC
  Administered 2013-07-12 – 2013-07-15 (×4): 25 mg via ORAL
  Filled 2013-07-12 (×4): qty 1

## 2013-07-12 MED ORDER — ATORVASTATIN CALCIUM 10 MG PO TABS
10.0000 mg | ORAL_TABLET | Freq: Every day | ORAL | Status: DC
  Administered 2013-07-12 – 2013-07-15 (×4): 10 mg via ORAL
  Filled 2013-07-12 (×4): qty 1

## 2013-07-12 MED ORDER — PANTOPRAZOLE SODIUM 40 MG IV SOLR
40.0000 mg | Freq: Two times a day (BID) | INTRAVENOUS | Status: DC
  Administered 2013-07-12 – 2013-07-15 (×6): 40 mg via INTRAVENOUS
  Filled 2013-07-12 (×7): qty 40

## 2013-07-12 MED ORDER — POTASSIUM CHLORIDE IN NACL 20-0.9 MEQ/L-% IV SOLN
INTRAVENOUS | Status: DC
  Administered 2013-07-12 – 2013-07-14 (×4): via INTRAVENOUS
  Filled 2013-07-12 (×6): qty 1000

## 2013-07-12 MED ORDER — ENOXAPARIN SODIUM 40 MG/0.4ML ~~LOC~~ SOLN
40.0000 mg | SUBCUTANEOUS | Status: DC
  Administered 2013-07-12: 40 mg via SUBCUTANEOUS
  Filled 2013-07-12 (×2): qty 0.4

## 2013-07-12 MED ORDER — ACETAMINOPHEN 325 MG PO TABS
650.0000 mg | ORAL_TABLET | Freq: Four times a day (QID) | ORAL | Status: DC | PRN
  Administered 2013-07-14 – 2013-07-15 (×3): 650 mg via ORAL
  Filled 2013-07-12 (×3): qty 2

## 2013-07-12 MED ORDER — ONDANSETRON HCL 4 MG PO TABS: 4.0000 mg | ORAL_TABLET | Freq: Four times a day (QID) | ORAL | Status: DC | PRN

## 2013-07-12 MED ORDER — PANTOPRAZOLE SODIUM 40 MG IV SOLR
40.0000 mg | Freq: Once | INTRAVENOUS | Status: AC
Start: 2013-07-12 — End: 2013-07-12
  Administered 2013-07-12: 40 mg via INTRAVENOUS
  Filled 2013-07-12: qty 40

## 2013-07-12 MED ORDER — POTASSIUM CHLORIDE 10 MEQ/100ML IV SOLN
INTRAVENOUS | Status: AC
  Administered 2013-07-12: 10 meq
  Filled 2013-07-12: qty 200

## 2013-07-12 MED ORDER — POTASSIUM CHLORIDE IN NACL 20-0.9 MEQ/L-% IV SOLN
Freq: Once | INTRAVENOUS | Status: AC
  Administered 2013-07-12: 14:00:00 via INTRAVENOUS
  Filled 2013-07-12: qty 1000

## 2013-07-12 MED ORDER — GI COCKTAIL ~~LOC~~
30.0000 mL | Freq: Three times a day (TID) | ORAL | Status: DC | PRN
  Administered 2013-07-12: 30 mL via ORAL
  Filled 2013-07-12 (×2): qty 30

## 2013-07-12 MED ORDER — ONDANSETRON HCL 4 MG/2ML IJ SOLN
4.0000 mg | Freq: Once | INTRAMUSCULAR | Status: AC
  Administered 2013-07-12: 4 mg via INTRAVENOUS
  Filled 2013-07-12: qty 2

## 2013-07-12 MED ORDER — ACETAMINOPHEN 650 MG RE SUPP: 650.0000 mg | Freq: Four times a day (QID) | RECTAL | Status: DC | PRN

## 2013-07-12 MED ORDER — IOHEXOL 300 MG/ML  SOLN
50.0000 mL | Freq: Once | INTRAMUSCULAR | Status: AC | PRN
  Administered 2013-07-12: 50 mL via ORAL

## 2013-07-12 MED ORDER — SODIUM CHLORIDE 0.9 % IV BOLUS (SEPSIS)
1000.0000 mL | Freq: Once | INTRAVENOUS | Status: AC
  Administered 2013-07-12: 1000 mL via INTRAVENOUS

## 2013-07-12 MED ORDER — ONDANSETRON HCL 4 MG/2ML IJ SOLN: 4.0000 mg | Freq: Four times a day (QID) | INTRAMUSCULAR | Status: DC | PRN

## 2013-07-12 MED ORDER — MORPHINE SULFATE 4 MG/ML IJ SOLN
4.0000 mg | INTRAMUSCULAR | Status: AC | PRN
  Administered 2013-07-12 (×3): 4 mg via INTRAVENOUS
  Filled 2013-07-12 (×3): qty 1

## 2013-07-12 MED ORDER — HYDROMORPHONE HCL PF 2 MG/ML IJ SOLN
2.0000 mg | INTRAMUSCULAR | Status: DC | PRN
  Administered 2013-07-12 – 2013-07-15 (×20): 2 mg via INTRAVENOUS
  Filled 2013-07-12 (×21): qty 1

## 2013-07-12 NOTE — ED Notes (Signed)
Dr. Jeneen Rinks made aware of hypokalemia at this time.

## 2013-07-12 NOTE — ED Provider Notes (Signed)
CSN: [id number]     Arrival date & time 07/12/13  1033 History   First MD Initiated Contact with Patient 07/12/13 1055     Chief Complaint  Patient presents with  . Chest Pain    HPI Patient presents with upper abdominal lower chest pain. Started 8:00 last night. Described as a sharp pain. Persistent over his epigastrium. Not feeling somewhat into his back. No prior similar episodes. History of heart cath in 07. Also Dr. Burt Knack for cardiology. Has yearly exams. 50% stenosis of LAD. 20-30% of his other native vessels. Started at rest. No unusual intake. He's had sharp epigastric pain into his back. Vomiting and diarrhea all night. No recent exertional pain. No recent exertional dyspnea or change in his exertional capacity. He takes a daily aspirin. He denies excessive anti-inflammatories. No alcohol use. No history of gallbladder disease. No food intolerance. No dietary changes.  Past Medical History  Diagnosis Date  . HYPERLIPIDEMIA 04/09/2007  . HYPERTENSION, BENIGN 04/19/2010  . GERD 04/09/2007  . RENAL CYST 05/27/2010  . OSTEOARTHRITIS, LUMBAR SPINE 04/09/2007  . LUMBAR DISC DISORDER 05/27/2010  . CAD, NATIVE VESSEL 04/19/2010    nonobstructive by cath 2007:  oLAD 20-30%, mLAD 50%, pCFX 20-30%, oAVCFX 20-30%, L renal art 50%;  normal LVF  . [id number](022.3)    Past Surgical History  Procedure Laterality Date  . Spine surgery  2006    C-spine surgery x 2  . Back surgery    . Neck surgery    . Lumbar disc surgery  08/06/2012    L3 & L4  . Foot fracture surgery    . Lumbar laminectomy/decompression microdiscectomy Left 08/10/2012    Procedure: Dura Repair Left Side L2-L3;  Surgeon: [name], MD;  Location: [place];  Service: Orthopedics;  Laterality: Left;  Wilson Frame, Sliding table, dura repair kit, microscope   Family History  Problem Relation Age of Onset  . Cancer Mother     Breast Cancer, Uterine Cancer  . Breast cancer Mother   . Uterine cancer Mother   . Hypertension  Brother   . Heart disease Mother    History  Substance Use Topics  . Smoking status: Former Smoker -- 0.50 packs/day for 44 years    Types: Cigarettes    Quit date: 04/06/2012  . Smokeless tobacco: Never Used     Comment: pt has stopped smoking about 9 months now  . Alcohol Use: No    Review of Systems  Constitutional: Positive for chills. Negative for fever, diaphoresis, appetite change and fatigue.  HENT: Negative for mouth sores, sore throat and trouble swallowing.   Eyes: Negative for visual disturbance.  Respiratory: Positive for shortness of breath. Negative for cough, chest tightness and wheezing.   Cardiovascular: Positive for chest pain.  Gastrointestinal: Positive for nausea, vomiting, abdominal pain and diarrhea. Negative for abdominal distention.  Endocrine: Negative for polydipsia, polyphagia and polyuria.  Genitourinary: Negative for dysuria, frequency and hematuria.  Musculoskeletal: Negative for gait problem.  Skin: Negative for color change, pallor and rash.  Neurological: Negative for dizziness, syncope, light-headedness and headaches.  Hematological: Does not bruise/bleed easily.  Psychiatric/Behavioral: Negative for behavioral problems and confusion.    Allergies  Ceftin  Home Medications   Current Outpatient Rx  Name  Route  Sig  Dispense  Refill  . aspirin EC 81 MG tablet   Oral   Take 1 tablet (81 mg total) by mouth daily.         [name] atorvastatin (LIPITOR)  10 MG tablet   Oral   Take 1 tablet (10 mg total) by mouth daily.   90 tablet   3   . losartan-hydrochlorothiazide (HYZAAR) 100-12.5 MG per tablet   Oral   Take 1 tablet by mouth daily.         [name] losartan-hydrochlorothiazide (HYZAAR) 100-12.5 MG per tablet      take 1 tablet by mouth once daily BACK ON BLOOD PRESSURE MEDS         . metoprolol succinate (TOPROL-XL) 25 MG 24 hr tablet   Oral   Take 25 mg by mouth daily.         . metoprolol succinate (TOPROL-XL) 25 MG 24 hr  tablet      take 1 tablet by mouth once daily         . pantoprazole (PROTONIX) 40 MG tablet   Oral   Take 40 mg by mouth 2 (two) times daily. Before meals          BP 161/82  Pulse 89  Temp(Src) 98.9 F (37.2 C) (Oral)  Resp 18  SpO2 98% Physical Exam  Constitutional:  Male sitting semirecumbent into triage bed appears uncomfortable. Not writhing. Not diaphoretic.  Cardiovascular: Normal rate, regular rhythm, S1 normal, S2 normal and normal heart sounds.  Exam reveals no gallop.   No murmur heard. Pulmonary/Chest:  Clear lungs. No abnormal breath sounds. No prolongation.  Abdominal: There is tenderness in the epigastric area.  Mild tenderness in the epigastric abdomen. States he has tenderness to palpate when he lays against the back of the chair he is sitting in. His back appears normal without rash or lesions.    ED Course  Procedures (including critical care time) Labs Review Labs Reviewed  CBC WITH DIFFERENTIAL - Abnormal; Notable for the following:    WBC 17.1 (*)    MCHC 36.7 (*)    Neutrophils Relative % 88 (*)    Neutro Abs 15.0 (*)    Lymphocytes Relative 7 (*)    All other components within normal limits  COMPREHENSIVE METABOLIC PANEL - Abnormal; Notable for the following:    Potassium 2.7 (*)    Glucose, Bld 179 (*)    Total Bilirubin 1.3 (*)    GFR calc non Af Amer 87 (*)    All other components within normal limits  URINALYSIS, ROUTINE W REFLEX MICROSCOPIC - Abnormal; Notable for the following:    APPearance CLOUDY (*)    Hgb urine dipstick TRACE (*)    Ketones, ur 15 (*)    Protein, ur 100 (*)    All other components within normal limits  POCT I-STAT, CHEM 8 - Abnormal; Notable for the following:    Hemoglobin 19.4 (*)    HCT 57.0 (*)    All other components within normal limits  LIPASE, BLOOD  URINE MICROSCOPIC-ADD ON  MAGNESIUM  POCT I-STAT TROPONIN I  POCT I-STAT TROPONIN I   Imaging Review Dg Chest 2 View  07/12/2013   CLINICAL DATA:   Chest pain and dizziness since last night  EXAM: CHEST  2 VIEW  COMPARISON:  02/06/2012  FINDINGS: The heart size and mediastinal contours are within normal limits. Both lungs are clear. The visualized skeletal structures are unremarkable.  IMPRESSION: No active cardiopulmonary disease.   Electronically Signed   By: Skipper Cliche M.D.   On: 07/12/2013 11:22    EKG Interpretation   None       MDM   1. Abdominal pain  2. Vomiting   3. Diarrhea    Initial and repeat troponin normal. Normal limits at, hepatobiliary, and pancreatic enzymes. Symptomatic with nausea and pain. Still a benign abdomen without peritoneal irritation. Epigastric. Was given proton pump inhibitor. His potassium his PVCs improved. I discussed the case with hospitalist. He be admitted for continued fluids. Will be admitted to a telemetry bed.    Tanna Furry, MD 07/12/13 [phone]

## 2013-07-12 NOTE — ED Notes (Signed)
Patient waiting to go to CT report called to floor already

## 2013-07-12 NOTE — ED Notes (Signed)
Pt states burning stopped after flush with NS.

## 2013-07-12 NOTE — ED Notes (Signed)
He c/o lower central chest area pain since yesterday evening.  It began as he was sleeping.  He states it kept him awake all night.  His skin is pale, warm and dry and he is breathing normally, although he states he has been short of breath at times.

## 2013-07-12 NOTE — H&P (Addendum)
[facility] History and Physical  [name]:[id number] DOB: 1951/01/10 DOA: 07/12/2013  Referring physician: EDP PCP: Renato Shin, MD  Cardiology Dr. Burt Knack  Chief Complaint: Abdominal pain   HPI: Jeff Lopez is a 63 y.o. male Past medical history of nonobstructive CAD, hypertension, lumbar disc disease, diet-controlled diabetes was in his usual state of health until yesterday. He went to bed after supper and subsequently started experiencing severe epigastric and lower chest Pain, the pain is described as sharp radiating down to the rest of his abdomen. pain is not pleuritic. This was associated with 4-5 episodes of vomiting, nonbloody nonbilious and 2 episodes of diarrhea. He denied any melena. She denies any fevers, reports noticing some chills. He denies eating any uncooked food, no recent travel, no sick contacts. Finally presented to the emergency room noted to have hypokalemia, mild leukocytosis, PVCs and EKG with nonspecific findings, troponin negative x2, 2 hours apart.   Review of Systems:  Constitutional:  No weight loss, night sweats, Fevers, chills, fatigue.  HEENT:  No headaches, Difficulty swallowing,Tooth/dental problems,Sore throat,  No sneezing, itching, ear ache, nasal congestion, post nasal drip,  Cardio-vascular:  No chest pain, Orthopnea, PND, swelling in lower extremities, anasarca, dizziness, palpitations  GI:  No heartburn, indigestion, abdominal pain, nausea, vomiting, diarrhea, change in bowel habits, loss of appetite  Resp:  No shortness of breath with exertion or at rest. No excess mucus, no productive cough, No non-productive cough, No coughing up of blood.No change in color of mucus.No wheezing.No chest wall deformity  Skin:  no rash or lesions.  GU:  no dysuria, change in color of urine, no urgency or frequency. No flank pain.  Musculoskeletal:  No joint pain or swelling. No decreased range of motion. No back pain.  Psych:   No change in mood or affect. No depression or anxiety. No memory loss.   Past Medical History  Diagnosis Date  . HYPERLIPIDEMIA 04/09/2007  . HYPERTENSION, BENIGN 04/19/2010  . GERD 04/09/2007  . RENAL CYST 05/27/2010  . OSTEOARTHRITIS, LUMBAR SPINE 04/09/2007  . LUMBAR DISC DISORDER 05/27/2010  . CAD, NATIVE VESSEL 04/19/2010    nonobstructive by cath 2007:  oLAD 20-30%, mLAD 50%, pCFX 20-30%, oAVCFX 20-30%, L renal art 50%;  normal LVF  . AQTMAUQJ(335.4)    Past Surgical History  Procedure Laterality Date  . Spine surgery  2006    C-spine surgery x 2  . Back surgery    . Neck surgery    . Lumbar disc surgery  08/06/2012    L3 & L4  . Foot fracture surgery    . Lumbar laminectomy/decompression microdiscectomy Left 08/10/2012    Procedure: Dura Repair Left Side L2-L3;  Surgeon: [name], MD;  Location: [place];  Service: Orthopedics;  Laterality: Left;  Wilson Frame, Sliding table, dura repair kit, microscope   Social History:  reports that he quit smoking about 15 months ago. His smoking use included Cigarettes. He has a 22 pack-year smoking history. He has never used smokeless tobacco. He reports that he does not drink alcohol or use illicit drugs.  Allergies  Allergen Reactions  . Ceftin Other (See Comments)    Patient stated it caused "sores in mouth"    Family History  Problem Relation Age of Onset  . Cancer Mother     Breast Cancer, Uterine Cancer  . Breast cancer Mother   . Uterine cancer Mother   . Hypertension Brother   . Heart disease Mother  Prior to Admission medications   Medication Sig Start Date End Date Taking? Authorizing Provider  aspirin EC 81 MG tablet Take 1 tablet (81 mg total) by mouth daily. 06/17/13  Yes Sherren Mocha, MD  atorvastatin (LIPITOR) 10 MG tablet Take 1 tablet (10 mg total) by mouth daily. 11/29/12  Yes Renato Shin, MD  losartan-hydrochlorothiazide (HYZAAR) 100-12.5 MG per tablet Take 1 tablet by mouth daily.   Yes Historical  Provider, MD  losartan-hydrochlorothiazide [facility]) 100-12.5 MG per tablet take 1 tablet by mouth once daily BACK ON BLOOD PRESSURE MEDS 05/20/13 07/12/13 Yes Sherren Mocha, MD  metoprolol succinate (TOPROL-XL) 25 MG 24 hr tablet Take 25 mg by mouth daily.   Yes Historical Provider, MD  metoprolol succinate (TOPROL-XL) 25 MG 24 hr tablet take 1 tablet by mouth once daily 05/20/13 07/12/13 Yes Sherren Mocha, MD  pantoprazole (PROTONIX) 40 MG tablet Take 40 mg by mouth 2 (two) times daily. Before meals   Yes Historical Provider, MD   Physical Exam: Filed Vitals:   07/12/13 1519  BP: 161/82  Pulse: 89  Temp: 98.9 F (37.2 C)  Resp: 18    BP 161/82  Pulse 89  Temp(Src) 98.9 F (37.2 C) (Oral)  Resp 18  SpO2 98%  General:  Appears  anxious  and  somewhat uncomfortable Eyes: PERRL, normal lids, irises & conjunctiva ENT: grossly normal hearing, lips & tongue Neck: no LAD, masses or thyromegaly Cardiovascular: RRR, no m/r/g. No LE edema. Telemetry: SR, no arrhythmias, multiple PVCs noted   Respiratory: CTA bilaterally, no w/r/r. Normal respiratory effort. Abdomen: soft, mild epigastric tenderness , no rigidity no rebound , nondistended , bowel sounds present , no flank tenderness  Skin: no rash or induration seen on limited exam Musculoskeletal: grossly normal tone BUE/BLE Psychiatric: grossly normal mood and affect, speech fluent and appropriate Neurologic: grossly non-focal.          Labs on Admission:  Basic Metabolic Panel:  Recent Labs Lab 07/12/13 1125 07/12/13 1344 07/12/13 1428  NA 139  --  143  K 2.7*  --  3.7  CL 96  --  102  CO2 25  --   --   GLUCOSE 179*  --  94  BUN 20  --  12  CREATININE 0.98  --  1.30  CALCIUM 9.4  --   --   MG  --  1.5  --    Liver Function Tests:  Recent Labs Lab 07/12/13 1125  AST 13  ALT 14  ALKPHOS 78  BILITOT 1.3*  PROT 6.8  ALBUMIN 4.3    Recent Labs Lab 07/12/13 1125  LIPASE 32   No results found for this  basename: AMMONIA,  in the last 168 hours CBC:  Recent Labs Lab 07/12/13 1125 07/12/13 1428  WBC 17.1*  --   NEUTROABS 15.0*  --   HGB 15.4 19.4*  HCT 42.0 57.0*  MCV 91.1  --   PLT 219  --    Cardiac Enzymes: No results found for this basename: CKTOTAL, CKMB, CKMBINDEX, TROPONINI,  in the last 168 hours  BNP (last 3 results) No results found for this basename: PROBNP,  in the last 8760 hours CBG: No results found for this basename: GLUCAP,  in the last 168 hours  Radiological Exams on Admission: Dg Chest 2 View  07/12/2013   CLINICAL DATA:  Chest pain and dizziness since last night  EXAM: CHEST  2 VIEW  COMPARISON:  02/06/2012  FINDINGS: The heart size and  mediastinal contours are within normal limits. Both lungs are clear. The visualized skeletal structures are unremarkable.  IMPRESSION: No active cardiopulmonary disease.   Electronically Signed   By: Skipper Cliche M.D.   On: 07/12/2013 11:22    EKG: Independently reviewed.   Assessment/Plan 1. Abdominal pain /nausea and vomiting  -Differential includes gastroenteritis , gastritis/PUD , lipase negative pancreatitis, gallbladder disease, ACS -Given the severity of his symptoms, I will check a CT abdomen pelvis  -Supportive care, IV fluids, antiemetics, IV PPI, GI cocktail -N.p.o. except ice chips pending CT -cycle cardiac enzymes -Further management his condition evolves  2. PVCs:  -Likely related to hypokalemia,  -Potassium replaced, monitor on telemetry, check 2 more sets of cardiac enzymes  3. nonobstructive CAD -Continue aspirin, metoprolol -Low risk Myoview in 2012, nonobstructive CAD on cath in 2007 -cycle 2 more sets of enzymes  4. Hypertension -hold Hyzaar, hydrate  5. diet-controlled diabetes  DVT prophylaxis with Lovenox  Code Status: Full Code Family Communication: none at bedside Disposition Plan: inpatient  Time spent:43mn  [facility] Pager [phone]

## 2013-07-12 NOTE — ED Notes (Signed)
O2 at 2L/min applied for PVCs.  Patient resting quietly.

## 2013-07-13 ENCOUNTER — Inpatient Hospital Stay (HOSPITAL_COMMUNITY): Payer: BC Managed Care – PPO

## 2013-07-13 ENCOUNTER — Inpatient Hospital Stay (HOSPITAL_COMMUNITY): Payer: BC Managed Care – PPO | Admitting: Anesthesiology

## 2013-07-13 ENCOUNTER — Encounter (HOSPITAL_COMMUNITY): Payer: BC Managed Care – PPO | Admitting: Anesthesiology

## 2013-07-13 ENCOUNTER — Encounter (HOSPITAL_COMMUNITY): Payer: Self-pay | Admitting: Anesthesiology

## 2013-07-13 ENCOUNTER — Encounter (HOSPITAL_COMMUNITY)
Admission: EM | Disposition: A | Discharge status: Home / Self Care | Payer: Self-pay | Source: Home / Self Care | Attending: Internal Medicine

## 2013-07-13 DIAGNOSIS — K8050 Calculus of bile duct without cholangitis or cholecystitis without obstruction: Secondary | ICD-10-CM

## 2013-07-13 DIAGNOSIS — K810 Acute cholecystitis: Secondary | ICD-10-CM

## 2013-07-13 DIAGNOSIS — R109 Unspecified abdominal pain: Secondary | ICD-10-CM

## 2013-07-13 DIAGNOSIS — K8000 Calculus of gallbladder with acute cholecystitis without obstruction: Secondary | ICD-10-CM

## 2013-07-13 DIAGNOSIS — R932 Abnormal findings on diagnostic imaging of liver and biliary tract: Secondary | ICD-10-CM

## 2013-07-13 HISTORY — PX: CHOLECYSTECTOMY: SHX55

## 2013-07-13 LAB — CBC
HEMATOCRIT: 37.8 % — AB (ref 39.0–52.0)
Hemoglobin: 13.5 g/dL (ref 13.0–17.0)
MCH: 33.2 pg (ref 26.0–34.0)
MCHC: 35.7 g/dL (ref 30.0–36.0)
MCV: 92.9 fL (ref 78.0–100.0)
Platelets: 184 10*3/uL (ref 150–400)
RBC: 4.07 MIL/uL — ABNORMAL LOW (ref 4.22–5.81)
RDW: 13.3 % (ref 11.5–15.5)
WBC: 17.5 10*3/uL — AB (ref 4.0–10.5)

## 2013-07-13 LAB — TROPONIN I

## 2013-07-13 LAB — COMPREHENSIVE METABOLIC PANEL
ALBUMIN: 3.4 g/dL — AB (ref 3.5–5.2)
ALT: 28 U/L (ref 0–53)
AST: 30 U/L (ref 0–37)
Alkaline Phosphatase: 66 U/L (ref 39–117)
BILIRUBIN TOTAL: 2.4 mg/dL — AB (ref 0.3–1.2)
BUN: 14 mg/dL (ref 6–23)
CALCIUM: 7.7 mg/dL — AB (ref 8.4–10.5)
CHLORIDE: 97 meq/L (ref 96–112)
CO2: 27 mEq/L (ref 19–32)
CREATININE: 0.94 mg/dL (ref 0.50–1.35)
GFR calc Af Amer: >90 mL/min (ref >90)
GFR calc non Af Amer: 88 mL/min — ABNORMAL LOW (ref >90)
Glucose, Bld: 161 mg/dL — ABNORMAL HIGH (ref 70–99)
Potassium: 2.9 mEq/L — CL (ref 3.7–5.3)
Sodium: 139 mEq/L (ref 137–147)
Total Protein: 5.9 g/dL — ABNORMAL LOW (ref 6.0–8.3)

## 2013-07-13 LAB — SURGICAL PCR SCREEN
MRSA, PCR: NEGATIVE
Staphylococcus aureus: NEGATIVE

## 2013-07-13 LAB — GLUCOSE, CAPILLARY: Glucose-Capillary: 146 mg/dL — ABNORMAL HIGH (ref 70–99)

## 2013-07-13 SURGERY — LAPAROSCOPIC CHOLECYSTECTOMY WITH INTRAOPERATIVE CHOLANGIOGRAM
Anesthesia: General | Site: Abdomen

## 2013-07-13 MED ORDER — LACTATED RINGERS IR SOLN
Status: DC | PRN
  Administered 2013-07-13: 1000 mL

## 2013-07-13 MED ORDER — FENTANYL CITRATE 0.05 MG/ML IJ SOLN
INTRAMUSCULAR | Status: DC | PRN
  Administered 2013-07-13: 50 ug via INTRAVENOUS
  Administered 2013-07-13: 150 ug via INTRAVENOUS
  Administered 2013-07-13: 50 ug via INTRAVENOUS

## 2013-07-13 MED ORDER — SUCCINYLCHOLINE CHLORIDE 20 MG/ML IJ SOLN
INTRAMUSCULAR | Status: DC | PRN
  Administered 2013-07-13: 100 mg via INTRAVENOUS

## 2013-07-13 MED ORDER — FENTANYL CITRATE 0.05 MG/ML IJ SOLN
INTRAMUSCULAR | Status: AC
  Filled 2013-07-13: qty 5

## 2013-07-13 MED ORDER — GLYCOPYRROLATE 0.2 MG/ML IJ SOLN
INTRAMUSCULAR | Status: AC
  Filled 2013-07-13: qty 3

## 2013-07-13 MED ORDER — PROPOFOL 10 MG/ML IV BOLUS
INTRAVENOUS | Status: AC
  Filled 2013-07-13: qty 20

## 2013-07-13 MED ORDER — LACTATED RINGERS IV SOLN: INTRAVENOUS | Status: DC

## 2013-07-13 MED ORDER — BUPIVACAINE HCL (PF) 0.25 % IJ SOLN
INTRAMUSCULAR | Status: DC | PRN
  Administered 2013-07-13: 30 mL

## 2013-07-13 MED ORDER — LIDOCAINE HCL (CARDIAC) 20 MG/ML IV SOLN
INTRAVENOUS | Status: AC
  Filled 2013-07-13: qty 5

## 2013-07-13 MED ORDER — LIDOCAINE HCL (CARDIAC) 20 MG/ML IV SOLN
INTRAVENOUS | Status: DC | PRN
  Administered 2013-07-13: 100 mg via INTRAVENOUS

## 2013-07-13 MED ORDER — MORPHINE SULFATE 2 MG/ML IJ SOLN: 1.0000 mg | INTRAMUSCULAR | Status: DC | PRN

## 2013-07-13 MED ORDER — GLYCOPYRROLATE 0.2 MG/ML IJ SOLN
INTRAMUSCULAR | Status: DC | PRN
  Administered 2013-07-13: .6 mg via INTRAVENOUS

## 2013-07-13 MED ORDER — 0.9 % SODIUM CHLORIDE (POUR BTL) OPTIME
TOPICAL | Status: DC | PRN
  Administered 2013-07-13: 1000 mL

## 2013-07-13 MED ORDER — BUPIVACAINE HCL (PF) 0.25 % IJ SOLN
INTRAMUSCULAR | Status: AC
  Filled 2013-07-13: qty 30

## 2013-07-13 MED ORDER — LACTATED RINGERS IV SOLN
INTRAVENOUS | Status: DC | PRN
  Administered 2013-07-13 (×2): via INTRAVENOUS

## 2013-07-13 MED ORDER — PHENYLEPHRINE HCL 10 MG/ML IJ SOLN
INTRAMUSCULAR | Status: DC | PRN
  Administered 2013-07-13: 80 ug via INTRAVENOUS

## 2013-07-13 MED ORDER — PROPOFOL 10 MG/ML IV BOLUS
INTRAVENOUS | Status: DC | PRN
  Administered 2013-07-13: 170 mg via INTRAVENOUS

## 2013-07-13 MED ORDER — PIPERACILLIN-TAZOBACTAM 3.375 G IVPB
3.3750 g | Freq: Three times a day (TID) | INTRAVENOUS | Status: DC
  Administered 2013-07-13 – 2013-07-15 (×7): 3.375 g via INTRAVENOUS
  Filled 2013-07-13 (×9): qty 50

## 2013-07-13 MED ORDER — ONDANSETRON HCL 4 MG/2ML IJ SOLN
INTRAMUSCULAR | Status: AC
  Filled 2013-07-13: qty 2

## 2013-07-13 MED ORDER — NEOSTIGMINE METHYLSULFATE 1 MG/ML IJ SOLN
INTRAMUSCULAR | Status: DC | PRN
  Administered 2013-07-13: 4 mg via INTRAVENOUS

## 2013-07-13 MED ORDER — CISATRACURIUM BESYLATE (PF) 10 MG/5ML IV SOLN
INTRAVENOUS | Status: DC | PRN
  Administered 2013-07-13: 6 mg via INTRAVENOUS

## 2013-07-13 MED ORDER — ONDANSETRON HCL 4 MG/2ML IJ SOLN
INTRAMUSCULAR | Status: DC | PRN
  Administered 2013-07-13: 4 mg via INTRAVENOUS

## 2013-07-13 MED ORDER — HYDROMORPHONE HCL PF 1 MG/ML IJ SOLN: 0.2500 mg | INTRAMUSCULAR | Status: DC | PRN

## 2013-07-13 MED ORDER — ZOLPIDEM TARTRATE 5 MG PO TABS
5.0000 mg | ORAL_TABLET | Freq: Once | ORAL | Status: AC
  Administered 2013-07-13: 5 mg via ORAL
  Filled 2013-07-13: qty 1

## 2013-07-13 MED ORDER — POTASSIUM CHLORIDE 10 MEQ/100ML IV SOLN
10.0000 meq | INTRAVENOUS | Status: AC
  Administered 2013-07-13 (×3): 10 meq via INTRAVENOUS
  Filled 2013-07-13 (×3): qty 100

## 2013-07-13 MED ORDER — CISATRACURIUM BESYLATE 20 MG/10ML IV SOLN
INTRAVENOUS | Status: AC
Start: 2013-07-13 — End: 2013-07-13
  Filled 2013-07-13: qty 10

## 2013-07-13 MED ORDER — IOHEXOL 300 MG/ML  SOLN
INTRAMUSCULAR | Status: DC | PRN
Start: 2013-07-13 — End: 2013-07-13
  Administered 2013-07-13: 50 mL

## 2013-07-13 SURGICAL SUPPLY — 40 items
ADH SKN CLS APL DERMABOND .7 (GAUZE/BANDAGES/DRESSINGS)
APL SKNCLS STERI-STRIP NONHPOA (GAUZE/BANDAGES/DRESSINGS) ×1
APPLIER CLIP ROT 10 11.4 M/L (STAPLE) ×2
APR CLP MED LRG 11.4X10 (STAPLE) ×1
BAG SPEC RTRVL LRG 6X4 10 (ENDOMECHANICALS)
BENZOIN TINCTURE PRP APPL 2/3 (GAUZE/BANDAGES/DRESSINGS) ×2 IMPLANT
CANISTER SUCTION 2500CC (MISCELLANEOUS) ×2 IMPLANT
CHLORAPREP W/TINT 26ML (MISCELLANEOUS) ×2 IMPLANT
CHOLANGIOGRAM CATH TAUT (CATHETERS) ×2 IMPLANT
CLIP APPLIE ROT 10 11.4 M/L (STAPLE) ×1 IMPLANT
COVER MAYO STAND STRL (DRAPES) IMPLANT
DECANTER SPIKE VIAL GLASS SM (MISCELLANEOUS) ×2 IMPLANT
DERMABOND ADVANCED (GAUZE/BANDAGES/DRESSINGS)
DERMABOND ADVANCED .7 DNX12 (GAUZE/BANDAGES/DRESSINGS) IMPLANT
DRAPE C-ARM 42X120 X-RAY (DRAPES) IMPLANT
DRAPE LAPAROSCOPIC ABDOMINAL (DRAPES) ×2 IMPLANT
DRAPE UTILITY XL STRL (DRAPES) ×2 IMPLANT
ELECT REM PT RETURN 9FT ADLT (ELECTROSURGICAL) ×2
ELECTRODE REM PT RTRN 9FT ADLT (ELECTROSURGICAL) ×1 IMPLANT
GLOVE SURG SIGNA 7.5 PF LTX (GLOVE) ×2 IMPLANT
GOWN STRL REUS W/TWL XL LVL3 (GOWN DISPOSABLE) ×6 IMPLANT
HEMOSTAT SURGICEL 4X8 (HEMOSTASIS) IMPLANT
IV CATH 14GX2 1/4 (CATHETERS) ×2 IMPLANT
IV SET MACRO CATH EXT 6 LUER (IV SETS) ×2 IMPLANT
KIT BASIN OR (CUSTOM PROCEDURE TRAY) ×2 IMPLANT
NS IRRIG 1000ML POUR BTL (IV SOLUTION) IMPLANT
POUCH SPECIMEN RETRIEVAL 10MM (ENDOMECHANICALS) IMPLANT
SET IRRIG TUBING LAPAROSCOPIC (IRRIGATION / IRRIGATOR) ×2 IMPLANT
SLEEVE XCEL OPT CAN 5 100 (ENDOMECHANICALS) ×2 IMPLANT
SOLUTION ANTI FOG 6CC (MISCELLANEOUS) ×2 IMPLANT
STOPCOCK 4 WAY LG BORE MALE ST (IV SETS) ×2 IMPLANT
STRIP CLOSURE SKIN 1/4X4 (GAUZE/BANDAGES/DRESSINGS) ×2 IMPLANT
SUT VIC AB 5-0 PS2 18 (SUTURE) ×2 IMPLANT
TOWEL OR 17X26 10 PK STRL BLUE (TOWEL DISPOSABLE) ×2 IMPLANT
TOWEL OR NON WOVEN STRL DISP B (DISPOSABLE) ×2 IMPLANT
TRAY LAP CHOLE (CUSTOM PROCEDURE TRAY) ×2 IMPLANT
TROCAR BLADELESS OPT 5 100 (ENDOMECHANICALS) ×2 IMPLANT
TROCAR XCEL BLUNT TIP 100MML (ENDOMECHANICALS) ×2 IMPLANT
TROCAR XCEL NON-BLD 11X100MML (ENDOMECHANICALS) ×2 IMPLANT
TUBING INSUFFLATION 10FT LAP (TUBING) ×2 IMPLANT

## 2013-07-13 NOTE — Progress Notes (Signed)
PROGRESS NOTE  IVY MERIWETHER FWY:[id number] DOB: 11-07-1950 DOA: 07/12/2013 PCP: Renato Shin, MD  Assessment/Plan: Acute cholecystitis - pain this morning localized to RUQ. Patient with low grade temp overnight and leukocytosis, subjective fevers. Will start Zosyn and obtain blood cultures. Surgery consulted, appreciate input; no need for RUQ [place] as cholecystitis apparent on the CT alone. Keep patient NPO PVCs -Likely related to hypokalemia, potassium replaced, monitor on telemetry, - cardiac enzymes negative Nonobstructive CAD -Continue aspirin, metoprolol  -Low risk Myoview in 2012, nonobstructive CAD on cath in 2007  Hypertension -hold Hyzaar, hydrate  Diet-controlled diabetes   Diet: NPO Fluids: NS DVT Prophylaxis: SCDs  Code Status: Full Family Communication: none  Disposition Plan: inpatient  Consultants:  Surgery  Procedures:  none   Antibiotics Zosyn 1/18 >>  HPI/Subjective: - feeling better with pain control, subjective fevers overnight. Pain this morning more in the RUQ.  Objective: Filed Vitals:   07/12/13 1252 07/12/13 1442 07/12/13 1519 07/12/13 1903  BP: 181/90 164/87 161/82 165/82  Pulse: 82 87 89 96  Temp:  98.6 F (37 C) 98.9 F (37.2 C) 100.3 F (37.9 C)  TempSrc:  Oral Oral Oral  Resp: 16 20 18 14   Height:    5\' 11"  (1.803 m)  Weight:    78 kg (171 lb 15.3 oz)  SpO2: 100% 99% 98% 97%    Intake/Output Summary (Last 24 hours) at 07/13/13 0724 Last data filed at 07/12/13 2300  Gross per 24 hour  Intake    290 ml  Output      0 ml  Net    290 ml   Filed Weights   07/12/13 1903  Weight: 78 kg (171 lb 15.3 oz)    Exam:  General:  NAD  Cardiovascular: regular rate and rhythm, without MRG  Respiratory: good air movement, clear to auscultation throughout, no wheezing, ronchi or rales  Abdomen: RUQ tenderness to palpation  MSK: no peripheral edema  Neuro: non focal  Data Reviewed: Basic Metabolic Panel:  Recent  Labs Lab 07/12/13 1125 07/12/13 1344 07/12/13 1428 07/13/13 0115  NA 139  --  143 139  K 2.7*  --  3.7 2.9*  CL 96  --  102 97  CO2 25  --   --  27  GLUCOSE 179*  --  94 161*  BUN 20  --  12 14  CREATININE 0.98  --  1.30 0.94  CALCIUM 9.4  --   --  7.7*  MG  --  1.5  --   --    Liver Function Tests:  Recent Labs Lab 07/12/13 1125 07/13/13 0115  AST 13 30  ALT 14 28  ALKPHOS 78 66  BILITOT 1.3* 2.4*  PROT 6.8 5.9*  ALBUMIN 4.3 3.4*    Recent Labs Lab 07/12/13 1125  LIPASE 32   CBC:  Recent Labs Lab 07/12/13 1125 07/12/13 1428 07/13/13 0115  WBC 17.1*  --  17.5*  NEUTROABS 15.0*  --   --   HGB 15.4 19.4* 13.5  HCT 42.0 57.0* 37.8*  MCV 91.1  --  92.9  PLT 219  --  184   Cardiac Enzymes:  Recent Labs Lab 07/12/13 2036 07/13/13 0115  TROPONINI <0.30 <0.30   Studies: Dg Chest 2 View  07/12/2013   CLINICAL DATA:  Chest pain and dizziness since last night  EXAM: CHEST  2 VIEW  COMPARISON:  02/06/2012  FINDINGS: The heart size and mediastinal contours are within normal limits. Both lungs  are clear. The visualized skeletal structures are unremarkable.  IMPRESSION: No active cardiopulmonary disease.   Electronically Signed   By: Skipper Cliche M.D.   On: 07/12/2013 11:22   Ct Abdomen Pelvis W Contrast  07/12/2013   CLINICAL DATA:  Epigastric pain.  Nausea and vomiting.  EXAM: CT ABDOMEN AND PELVIS WITH CONTRAST  TECHNIQUE: Multidetector CT imaging of the abdomen and pelvis was performed using the standard protocol following bolus administration of intravenous contrast.  CONTRAST:  94mL OMNIPAQUE IOHEXOL 300 MG/ML SOLN, 111mL OMNIPAQUE IOHEXOL 300 MG/ML SOLN  COMPARISON:  None.  FINDINGS: Mild dependent atelectasis is present bilaterally. The lungs are otherwise clear. The heart size is normal. No significant pleural or pericardial effusion is present.  A hyperdense lesion along the posterior dome of the left lobe of the liver is not visualized on the delayed  sequences. This likely represents a hemangioma. The spleen is within normal limits. The liver is otherwise unremarkable. The stomach, duodenum, and pancreas are within normal limits. There is prominent wall nasal within the gallbladder and some pericholecystic edema. The adrenal glands are slightly thickened bilaterally without a discrete mass. Exophytic cysts are present in both kidneys. No solid mass lesion is present. The ureters are within normal limits bilaterally. The urinary bladder is unremarkable.  The rectosigmoid colon is within normal limits. The remainder the colon is unremarkable. The appendix is visualized and normal. Small bowel is within normal limits. There is mild prominence of the prostate gland extending into the base of the bladder.  Patient is status post lumbar fusion L2-S1. The hardware is intact. No focal lytic or blastic lesions are present. Incidental note is made of a benign appearing lab, within the obturator internus muscle on the right.  IMPRESSION: 1. Inflammatory changes of the gallbladder suggest cholecystitis. This could be confirmed with ultrasound if clinically indicated. 2. Bilateral exophytic renal cyst. 3. Postsurgical changes of the lumbar spine. 4. Hyperdense lesion over the dome of the liver likely represents a hemangioma. This area could be further evaluated with ultrasound as well.   Electronically Signed   By: Lawrence Santiago M.D.   On: 07/12/2013 19:25    Scheduled Meds: . aspirin EC  81 mg Oral Daily  . atorvastatin  10 mg Oral Daily  . enoxaparin (LOVENOX) injection  40 mg Subcutaneous Q24H  . metoprolol succinate  25 mg Oral Daily  . pantoprazole (PROTONIX) IV  40 mg Intravenous Q12H  . piperacillin-tazobactam (ZOSYN)  IV  3.375 g Intravenous Q8H   Continuous Infusions: . 0.9 % NaCl with KCl 20 mEq / L 100 mL/hr at 07/13/13 0600    Active Problems:   CAD, NATIVE VESSEL   LUMBAR DISC DISORDER   COPD GOLD 0   Type II or unspecified type diabetes  mellitus without mention of complication, not stated as uncontrolled   Abdominal pain  Time spent: [place], MD [facility] Pager [phone]. If 7 PM - 7 AM, please contact night-coverage at [email or web address], password [facility] 07/13/2013, 7:24 AM  LOS: 1 day

## 2013-07-13 NOTE — Anesthesia Postprocedure Evaluation (Signed)
  Anesthesia Post-op Note  Patient: Jeff Lopez  Procedure(s) Performed: Procedure(s) (LRB): LAPAROSCOPIC CHOLECYSTECTOMY WITH INTRAOPERATIVE CHOLANGIOGRAM (N/A)  Patient Location: PACU  Anesthesia Type: General  Level of Consciousness: awake and alert   Airway and Oxygen Therapy: Patient Spontanous Breathing  Post-op Pain: mild  Post-op Assessment: Post-op Vital signs reviewed, Patient's Cardiovascular Status Stable, Respiratory Function Stable, Patent Airway and No signs of Nausea or vomiting  Last Vitals:  Filed Vitals:   07/13/13 1430  BP: 115/65  Pulse: 79  Temp:   Resp: 13    Post-op Vital Signs: stable   Complications: No apparent anesthesia complications

## 2013-07-13 NOTE — Preoperative (Signed)
Beta Blockers   Reason not to administer Beta Blockers:Not Applicable 

## 2013-07-13 NOTE — Anesthesia Preprocedure Evaluation (Addendum)
Anesthesia Evaluation  Patient identified by MRN, date of birth, ID band Patient awake    Reviewed: Allergy & Precautions, H&P , NPO status , Patient's Chart, lab work & pertinent test results, reviewed documented beta blocker date and time   Airway Mallampati: II TM Distance: >3 FB Neck ROM: full    Dental no notable dental hx. (+) Teeth Intact and Dental Advisory Given   Pulmonary COPDformer smoker,  No significant respiratory problems breath sounds clear to auscultation  Pulmonary exam normal       Cardiovascular hypertension, On Home Beta Blockers + CAD Rhythm:regular Rate:Normal     Neuro/Psych negative neurological ROS  negative psych ROS   GI/Hepatic negative GI ROS, Neg liver ROS, GERD-  ,  Endo/Other  diabetes, Well Controlled, Type 2Diet controlled DM  Renal/GU negative Renal ROS  negative genitourinary   Musculoskeletal   Abdominal   Peds  Hematology negative hematology ROS (+) anemia , Non-obstructive CAD by cath 2007   Anesthesia Other Findings K 2.9  Reproductive/Obstetrics negative OB ROS                          Anesthesia Physical Anesthesia Plan  ASA: III  Anesthesia Plan: General   Post-op Pain Management:    Induction: Intravenous  Airway Management Planned: Oral ETT  Additional Equipment:   Intra-op Plan:   Post-operative Plan: Extubation in OR  Informed Consent: I have reviewed the patients History and Physical, chart, labs and discussed the procedure including the risks, benefits and alternatives for the proposed anesthesia with the patient or authorized representative who has indicated his/her understanding and acceptance.   Dental Advisory Given  Plan Discussed with: CRNA and Surgeon  Anesthesia Plan Comments:         Anesthesia Quick Evaluation

## 2013-07-13 NOTE — Progress Notes (Signed)
CRITICAL VALUE ALERT  Critical value received:  Potassium  Date of notification:  2.9   Time of notification:  07/13/13  Critical value read back:yes  Nurse who received alert:  Levie Heritage, RN  MD notified (1st page):  Rogue Bussing  Time of first page:  0238  MD notified (2nd page):  Time of second page:  Responding MD:  Rogue Bussing  Time MD responded:  71  New orders given for runs of potassium

## 2013-07-13 NOTE — Op Note (Signed)
07/12/2013 - 07/13/2013  2:11 PM  PATIENT:  Jeff Lopez, 63 y.o., male, MRN: [id number]  PREOP DIAGNOSIS:  cholecystitis  POSTOP DIAGNOSIS:   Acute infarcted cholecystitis, cholelithiasis, distal common bile duct stone  PROCEDURE:   Procedure(s): LAPAROSCOPIC CHOLECYSTECTOMY WITH INTRAOPERATIVE CHOLANGIOGRAM  SURGEON:   Alphonsa Overall, M.D.  Terrence DupontJohney Maine, M.D.  ANESTHESIA:   general  Anesthesiologist: Peyton Najjar, MD CRNA: Sharlette Dense, CRNA  General  ASA: @asa @  EBL:  minimal  ml  BLOOD ADMINISTERED: none  DRAINS: none   LOCAL MEDICATIONS USED:   25 cc 1/4% marcaine  SPECIMEN:   Gall bladder  COUNTS CORRECT:  YES  INDICATIONS FOR PROCEDURE:  Jeff Lopez is a 62 y.o. (DOB: 1950-09-02) white  male whose primary care physician is Renato Shin, MD and comes for cholecystectomy.   The indications and risks of the gall bladder surgery were explained to the patient.  The risks include, but are not limited to, infection, bleeding, common bile duct injury and open surgery.  SURGERY:  The patient was taken to room #[facility] at [facility].  The abdomen was prepped with chloroprep.  The patient was already on Zosyn prior to coming to the OR.   A time out was held and the surgical checklist run.   An infraumbilical incision was made into the abdominal cavity.  A 12 mm Hasson trocar was inserted into the abdominal cavity through the infraumbilical incision and secured with a 0 Vicryl suture.  Three additional trocars were inserted: a 10 mm trocar in the sub-xiphoid location, a 5 mm trocar in the right mid subcostal area, and a 5 mm trocar in the right lateral subcostal area.   The abdomen was explored and the liver, stomach, and bowel that could be seen were unremarkable.   The gall bladder distended and tense.  I decompressed the gall bladder with a laparoscopic suction needle and got blood stained "white" bile.  The gall bladder was then grasped, and rotated  cephalad.  Disssection was carried down to the gall bladder/cystic duct junction and the cystic duct isolated.  Part of the gall bladder was infarcted and tissue paper thin.  A clip was placed on the gall bladder side of the cystic duct.   An intra-operative cholangiogram was shot.   The intra-operative cholangiogram was shot using a cut off Taut catheter placed through a 14 gauge angiocath in the RUQ.  The Taut catheter was inserted in the cut cystic duct and secured with an endoclip.  A cholangiogram was shot with 10 cc of 1/2 strength Omnipaque.  Using fluoroscopy, the cholangiogram showed the flow of contrast into the common bile duct, and up the hepatic radicals.  The contrast did not go into the duodenum and there was a "meniscal sign" at the distal common bile duct.   The Taut catheter was removed.  The cystic duct was tripley endoclipped and the cystic artery was identified and clipped.  The gall bladder was bluntly and sharpley dissected from the gall bladder bed.   After the gall bladder was removed from the liver, the gall bladder bed and [place] were inspected.  There was no bleeding or bile leak.  The gall bladder was placed in a endocatch bag and delivered through the umbilicus.  The abdomen was irrigated with 500 cc saline.   The trocars were then removed.  I infiltrated 25cc of 1/4% Marcaine into the incisions.  The umbilical port closed with a 0  Vicryl suture and the skin closed with 5-0 vicryl.  The skin was painted with Dermabond.  [name] sponge and needle count were correct.  The patient was transported to the RR in good condition.   He will need an ERCP to clear his common bile duct.  Alphonsa Overall, MD, [facility] Surgery Pager: [phone] Office phone:  [phone]

## 2013-07-13 NOTE — Anesthesia Procedure Notes (Signed)
Procedure Name: Intubation Date/Time: 07/13/2013 12:41 PM Performed by: Danley Danker L Patient Re-evaluated:Patient Re-evaluated prior to inductionOxygen Delivery Method: Circle system utilized Preoxygenation: Pre-oxygenation with 100% oxygen Intubation Type: IV induction Ventilation: Mask ventilation without difficulty and Oral airway inserted - appropriate to patient size Laryngoscope Size: Miller and 3 Grade View: Grade I Tube type: Oral Tube size: 8.0 mm Number of attempts: 1 Airway Equipment and Method: Stylet Placement Confirmation: ETT inserted through vocal cords under direct vision,  breath sounds checked- equal and bilateral and positive ETCO2 Secured at: 21 cm Tube secured with: Tape Dental Injury: Teeth and Oropharynx as per pre-operative assessment

## 2013-07-13 NOTE — Consult Note (Addendum)
Consult Note   Referring Provider:  Dr. Keturah Barre. Lucia Gaskins Primary Care Physician:  Renato Shin, MD Primary Gastroenterologist:  Dr. Ardis Hughs  Reason for Consultation:  Abnormal IOC  HPI: Jeff Lopez is a 63 y.o. male who underwent developed acute epigastric pain/lower chest pain 2 days ago associated with N/V. CT showed cholecystitis. He underwent lap chole today with IOC this afternoon showing a convex filling defect in the distal CBD with very little drainage and a long segment CHD/CBD filling defect possible sludge or blood. Denies weight loss, constipation, diarrhea, change in stool caliber, melena, hematochezia, dysphagia, reflux symptoms.   Past Medical History  Diagnosis Date  . HYPERLIPIDEMIA 04/09/2007  . HYPERTENSION, BENIGN 04/19/2010  . GERD 04/09/2007  . RENAL CYST 05/27/2010  . OSTEOARTHRITIS, LUMBAR SPINE 04/09/2007  . LUMBAR DISC DISORDER 05/27/2010  . CAD, NATIVE VESSEL 04/19/2010    nonobstructive by cath 2007:  oLAD 20-30%, mLAD 50%, pCFX 20-30%, oAVCFX 20-30%, L renal art 50%;  normal LVF  . [id number](725.3)     Past Surgical History  Procedure Laterality Date  . Spine surgery  2006    C-spine surgery x 2  . Back surgery    . Neck surgery    . Lumbar disc surgery  08/06/2012    L3 & L4  . Foot fracture surgery    . Lumbar laminectomy/decompression microdiscectomy Left 08/10/2012    Procedure: Dura Repair Left Side L2-L3;  Surgeon: [name], MD;  Location: [place];  Service: Orthopedics;  Laterality: Left;  Wilson Frame, Sliding table, dura repair kit, microscope    Prior to Admission medications   Medication Sig Start Date End Date Taking? Authorizing Provider  aspirin EC 81 MG tablet Take 1 tablet (81 mg total) by mouth daily. 06/17/13  Yes Sherren Mocha, MD  atorvastatin (LIPITOR) 10 MG tablet Take 1 tablet (10 mg total) by mouth daily. 11/29/12  Yes Renato Shin, MD  losartan-hydrochlorothiazide (HYZAAR) 100-12.5 MG per tablet Take 1 tablet by mouth  daily.   Yes Historical Provider, MD  metoprolol succinate (TOPROL-XL) 25 MG 24 hr tablet Take 25 mg by mouth daily.   Yes Historical Provider, MD  pantoprazole (PROTONIX) 40 MG tablet Take 40 mg by mouth 2 (two) times daily. Before meals   Yes Historical Provider, MD    Current Facility-Administered Medications  Medication Dose Route Frequency Provider Last Rate Last Dose  . 0.9 % NaCl with KCl 20 mEq/ L  infusion   Intravenous Continuous Domenic Polite, MD 100 mL/hr at 07/13/13 0600    . acetaminophen (TYLENOL) tablet 650 mg  650 mg Oral Q6H PRN Domenic Polite, MD       Or  . acetaminophen (TYLENOL) suppository 650 mg  650 mg Rectal Q6H PRN Domenic Polite, MD      . aspirin EC tablet 81 mg  81 mg Oral Daily Domenic Polite, MD   81 mg at 07/13/13 1515  . atorvastatin (LIPITOR) tablet 10 mg  10 mg Oral Daily Domenic Polite, MD   10 mg at 07/13/13 1042  . gi cocktail (Maalox,Lidocaine,Donnatal)  30 mL Oral TID PRN Domenic Polite, MD   30 mL at 07/12/13 2005  . HYDROmorphone (DILAUDID) injection 2 mg  2 mg Intravenous Q3H PRN Domenic Polite, MD   2 mg at 07/13/13 1206  . metoprolol succinate (TOPROL-XL) 24 hr tablet 25 mg  25 mg Oral Daily Domenic Polite, MD   25 mg at 07/13/13 1043  . morphine 2 MG/ML injection 1-3  mg  1-3 mg Intravenous Q2H PRN Shann Medal, MD      . ondansetron [facility]) tablet 4 mg  4 mg Oral Q6H PRN Domenic Polite, MD       Or  . ondansetron [facility]) injection 4 mg  4 mg Intravenous Q6H PRN Domenic Polite, MD      . pantoprazole (PROTONIX) injection 40 mg  40 mg Intravenous Q12H Domenic Polite, MD   40 mg at 07/13/13 1043  . piperacillin-tazobactam (ZOSYN) IVPB 3.375 g  3.375 g Intravenous [address] [place], [place]   3.375 g at 07/13/13 1515    Allergies as of 07/12/2013 - Review Complete 07/12/2013  Allergen Reaction Noted  . Ceftin Other (See Comments) 07/31/2011    Family History  Problem Relation Age of Onset  . Cancer Mother     Breast Cancer, Uterine  Cancer  . Breast cancer Mother   . Uterine cancer Mother   . Hypertension Brother   . Heart disease Mother     History   Social History  . Marital Status: Single    Spouse Name: N/A    Number of Children: 1  . Years of Education: N/A   Occupational History  . [employer]    Social History Main Topics  . Smoking status: Former Smoker -- 0.50 packs/day for 44 years    Types: Cigarettes    Quit date: 04/06/2012  . Smokeless tobacco: Never Used     Comment: pt has stopped smoking about 9 months now  . Alcohol Use: No  . Drug Use: No  . Sexual Activity: Not Currently   Other Topics Concern  . Not on file   Social History Narrative   Works in Psychologist, educational.    Review of Systems: Gen: Denies any fever, chills, sweats, anorexia, fatigue, weakness, malaise, weight loss, and sleep disorder CV: Denies chest pain, angina, palpitations, syncope, orthopnea, PND, peripheral edema, and claudication. Resp: Denies dyspnea at rest, dyspnea with exercise, cough, sputum, wheezing, coughing up blood, and pleurisy. GI: Denies vomiting blood, jaundice, and fecal incontinence.   Denies dysphagia or odynophagia. GU : Denies urinary burning, blood in urine, urinary frequency, urinary hesitancy, nocturnal urination, and urinary incontinence. MS: Denies joint pain, limitation of movement, and swelling, stiffness, low back pain, extremity pain. Denies muscle weakness, cramps, atrophy.  Derm: Denies rash, itching, dry skin, hives, moles, warts, or unhealing ulcers.  Psych: Denies depression, anxiety, memory loss, suicidal ideation, hallucinations, paranoia, and confusion. Heme: Denies bruising, bleeding, and enlarged lymph nodes. Neuro:  Denies any headaches, dizziness, paresthesias. Endo:  Denies any problems with DM, thyroid, adrenal function.  Physical Exam: Vital signs in last 24 hours: Temp:  [97.6 F (36.4 C)-100.3 F (37.9 C)] 97.6 F (36.4 C) (01/18 1518) Pulse Rate:  [75-96] 75  (01/18 1518) Resp:  [12-20] 16 (01/18 1518) BP: (115-165)/(58-82) 126/75 mmHg (01/18 1518) SpO2:  [96 %-99 %] 97 % (01/18 1518) Weight:  [171 lb 15.3 oz (78 kg)] 171 lb 15.3 oz (78 kg) (01/17 1903) Last BM Date: 07/11/13 General:   Alert, well-developed, well-nourished, pleasant and cooperative in NAD Head:  Normocephalic and atraumatic. Eyes:  Sclera clear, no icterus.   Conjunctiva pink. Ears:  Normal auditory acuity. Nose:  No deformity, discharge, or lesions. Mouth:  No deformity or lesions.  Oropharynx pink & moist. Neck:  Supple; no masses or thyromegaly. Lungs:  Clear throughout to auscultation.   No wheezes, crackles, or rhonchi. No acute distress. Heart:  Regular rate and rhythm;  no murmurs, clicks, rubs,  or gallops. Abdomen:  Soft, mild tenderness near lap chole incisions and mildly distended. No masses, hepatosplenomegaly or hernias noted. Normal bowel sounds, without guarding, and without rebound.   Rectal:  Deferred   Msk:  Symmetrical without gross deformities. Normal posture. Pulses:  Normal pulses noted. Extremities:  Without clubbing or edema. Neurologic:  Alert and  oriented x4;  grossly normal neurologically. Skin:  Intact without significant lesions or rashes. Cervical Nodes:  No significant cervical adenopathy. Psych:  Alert and cooperative. Normal mood and affect.  Intake/Output from previous day: 01/17 0701 - 01/18 0700 In: 1290 [I.V.:990; IV Piggyback:300] Out: -  Intake/Output this shift: Total I/O In: 1600 [I.V.:1500; IV Piggyback:100] Out: 160 [Urine:110; Blood:50]  Lab Results:  Recent Labs  07/12/13 1125 07/12/13 1428 07/13/13 0115  WBC 17.1*  --  17.5*  HGB 15.4 19.4* 13.5  HCT 42.0 57.0* 37.8*  PLT 219  --  184   BMET  Recent Labs  07/12/13 1125 07/12/13 1428 07/13/13 0115  NA 139 143 139  K 2.7* 3.7 2.9*  CL 96 102 97  CO2 25  --  27  GLUCOSE 179* 94 161*  BUN _0 CREATININE 0.98 1.30 0.94  CALCIUM 9.4  --  7.7*    LFT  Recent Labs  07/13/13 0115  PROT 5.9*  ALBUMIN 3.4*  AST 30  ALT 28  ALKPHOS 66  BILITOT 2.4*    Studies/Results: Dg Chest 2 View  07/12/2013   CLINICAL DATA:  Chest pain and dizziness since last night  EXAM: CHEST  2 VIEW  COMPARISON:  02/06/2012  FINDINGS: The heart size and mediastinal contours are within normal limits. Both lungs are clear. The visualized skeletal structures are unremarkable.  IMPRESSION: No active cardiopulmonary disease.   Electronically Signed   By: Skipper Cliche M.D.   On: 07/12/2013 11:22   Dg Cholangiogram Operative  07/13/2013   CLINICAL DATA:  Intraoperative cholangiogram.  EXAM: INTRAOPERATIVE CHOLANGIOGRAM  TECHNIQUE: Cholangiographic images from the C-arm fluoroscopic device were submitted for interpretation post-operatively. Please see the procedural report for the amount of contrast and the fluoroscopy time utilized.  COMPARISON:  Preoperative CT scan of the abdomen pelvis dated 12 July 2013  FINDINGS: Contrast has filled normal-appearing intrahepatic ducts as well as the and common hepatic duct and common bile duct. There is a long segment filling defect within the and common hepatic duct and proximal common bile duct. A discrete rounded stone is not demonstrated. However, there is very little contrast entering the duodenum. There is a convex border filling defect in the distal aspect of the common bile duct which may reflect a retained stone.  IMPRESSION: 1. There is a long segment filling defect within the common hepatic duct and proximal common bile duct. This may reflect sludge or blood products. 2. Dilated convex bordered filling defect in the distal common bile duct without allowing little drainage of contrast may reflect a retained common bile duct stone.   Electronically Signed   By: David  Martinique   On: 07/13/2013 13:51   Ct Abdomen Pelvis W Contrast  07/12/2013   CLINICAL DATA:  Epigastric pain.  Nausea and vomiting.  EXAM: CT ABDOMEN AND  PELVIS WITH CONTRAST  TECHNIQUE: Multidetector CT imaging of the abdomen and pelvis was performed using the standard protocol following bolus administration of intravenous contrast.  CONTRAST:  71m OMNIPAQUE IOHEXOL 300 MG/ML SOLN, 1031mOMNIPAQUE IOHEXOL 300 MG/ML SOLN  COMPARISON:  None.  FINDINGS: Mild dependent atelectasis is present bilaterally. The lungs are otherwise clear. The heart size is normal. No significant pleural or pericardial effusion is present.  A hyperdense lesion along the posterior dome of the left lobe of the liver is not visualized on the delayed sequences. This likely represents a hemangioma. The spleen is within normal limits. The liver is otherwise unremarkable. The stomach, duodenum, and pancreas are within normal limits. There is prominent wall nasal within the gallbladder and some pericholecystic edema. The adrenal glands are slightly thickened bilaterally without a discrete mass. Exophytic cysts are present in both kidneys. No solid mass lesion is present. The ureters are within normal limits bilaterally. The urinary bladder is unremarkable.  The rectosigmoid colon is within normal limits. The remainder the colon is unremarkable. The appendix is visualized and normal. Small bowel is within normal limits. There is mild prominence of the prostate gland extending into the base of the bladder.  Patient is status post lumbar fusion L2-S1. The hardware is intact. No focal lytic or blastic lesions are present. Incidental note is made of a benign appearing lab, within the obturator internus muscle on the right.  IMPRESSION: 1. Inflammatory changes of the gallbladder suggest cholecystitis. This could be confirmed with ultrasound if clinically indicated. 2. Bilateral exophytic renal cyst. 3. Postsurgical changes of the lumbar spine. 4. Hyperdense lesion over the dome of the liver likely represents a hemangioma. This area could be further evaluated with ultrasound as well.   Electronically  Signed   By: Lawrence Santiago M.D.   On: 07/12/2013 19:25    Previous Endoscopies: Colon 08/2011 showing 2 TAs and internal/external hemorrhoids EGD 07/3008 HH, irregular z-line   Impression/Recommendations:  1. S/P lap chole today for cholecystitis/cholelithiasis.   2. Distal CBD stone. CBD/CHD filling defect, possibly sludge. T bili is mildly elevated but other LFTs are normal. Repeat LFTs. Will try to arrange ERCP for  tomorrow.   3. GERD. Continue daily PPI.  4. Hypokalemia. Replace.    LOS: 1 day   Malcolm T. Fuller Plan MD [facility] 07/13/2013, 3:51 PM

## 2013-07-13 NOTE — Transfer of Care (Signed)
Immediate Anesthesia Transfer of Care Note  Patient: Jeff Lopez  Procedure(s) Performed: Procedure(s): LAPAROSCOPIC CHOLECYSTECTOMY WITH INTRAOPERATIVE CHOLANGIOGRAM (N/A)  Patient Location: PACU  Anesthesia Type:General  Level of Consciousness: awake, alert  and oriented  Airway & Oxygen Therapy: Patient Spontanous Breathing and Patient connected to nasal cannula oxygen  Post-op Assessment: Report given to PACU RN and Post -op Vital signs reviewed and stable  Post vital signs: Reviewed and stable  Complications: No apparent anesthesia complications

## 2013-07-13 NOTE — Consult Note (Signed)
Re:   Jeff Lopez DOB:   October 16, 1950 MRN:   [id number]  [facility] Consultation  ASSESSMENT AND PLAN: 1.  Cholecystitis  I discussed with the patient the indications and risks of gall bladder surgery.  The primary risks of gall bladder surgery include, but are not limited to, bleeding, infection, common bile duct injury, and open surgery.  There is also the risk that the patient may have continued symptoms after surgery.  However, the likelihood of improvement in symptoms and return to the patient's normal status is good. We discussed the typical post-operative recovery course. I tried to answer the patient's questions.  I gave the patient literature about gall bladder surgery.  His father had gall bladder surgery here 2 years ago - unknown Psychologist, sport and exercise - so he is familiar with what is going on.  Will plan surgery later this AM.  2.  CAD  Follow by Dr. Burt Knack 3.  HTN 4.  GERD 5.  Osteoarthritis  Back surgery by Dr. Louanne Skye - Feb 2014   Chief Complaint  Patient presents with  . Chest Pain   REFERRING PHYSICIAN:  Dr. Cruzita Lederer, hospitalist  HISTORY OF PRESENT ILLNESS: Jeff Lopez is a 63 y.o. (DOB: 05-30-1951)  white  male whose primary care physician is Jeff Shin, MD.  He was admitted yesterday for abdominal pain.  He had a neg colonoscopy through [place] (he can't remember who did the procedure) about 2 years ago.  Otherwise he has had no GI symptoms.  He has had no prior abdominal surgery.  He has no history of stomach disease.  No history of liver disease.   No history of pancreas disease.  No history of colon disease. He developed retrosternal/episgastric pain Friday (07/11/2013) night.  It got worse and he came to the [facility] yesterday.  He has had no fever, no jaundice, and no vomiting.  WBD - 17,500 - 07/12/2013 CT scan - 07/12/2013 - 1. Inflammatory changes of the gallbladder suggest cholecystitis. 2. Bilateral exophytic renal cyst.  3. Postsurgical changes of the lumbar spine.   4. Hyperdense lesion over the dome of the liver likely represents a hemangioma.     Past Medical History  Diagnosis Date  . HYPERLIPIDEMIA 04/09/2007  . HYPERTENSION, BENIGN 04/19/2010  . GERD 04/09/2007  . RENAL CYST 05/27/2010  . OSTEOARTHRITIS, LUMBAR SPINE 04/09/2007  . LUMBAR DISC DISORDER 05/27/2010  . CAD, NATIVE VESSEL 04/19/2010    nonobstructive by cath 2007:  oLAD 20-30%, mLAD 50%, pCFX 20-30%, oAVCFX 20-30%, L renal art 50%;  normal LVF  . QBHALPFX(902.4)       Past Surgical History  Procedure Laterality Date  . Spine surgery  2006    C-spine surgery x 2  . Back surgery    . Neck surgery    . Lumbar disc surgery  08/06/2012    L3 & L4  . Foot fracture surgery    . Lumbar laminectomy/decompression microdiscectomy Left 08/10/2012    Procedure: Dura Repair Left Side L2-L3;  Surgeon: [name], MD;  Location: [place];  Service: Orthopedics;  Laterality: Left;  Wilson Frame, Sliding table, dura repair kit, microscope      Current Facility-Administered Medications  Medication Dose Route Frequency Provider Last Rate Last Dose  . 0.9 % NaCl with KCl 20 mEq/ L  infusion   Intravenous Continuous Domenic Polite, MD 100 mL/hr at 07/13/13 0600    . acetaminophen (TYLENOL) tablet 650 mg  650 mg Oral Q6H PRN Domenic Polite,  MD       Or  . acetaminophen (TYLENOL) suppository 650 mg  650 mg Rectal Q6H PRN Domenic Polite, MD      . aspirin EC tablet 81 mg  81 mg Oral Daily Domenic Polite, MD   81 mg at 07/12/13 2003  . atorvastatin (LIPITOR) tablet 10 mg  10 mg Oral Daily Domenic Polite, MD   10 mg at 07/12/13 2003  . gi cocktail (Maalox,Lidocaine,Donnatal)  30 mL Oral TID PRN Domenic Polite, MD   30 mL at 07/12/13 2005  . HYDROmorphone (DILAUDID) injection 2 mg  2 mg Intravenous Q3H PRN Domenic Polite, MD   2 mg at 07/13/13 0550  . metoprolol succinate (TOPROL-XL) 24 hr tablet 25 mg  25 mg Oral Daily Domenic Polite, MD   25 mg at 07/12/13 2003  . ondansetron (ZOFRAN) tablet 4 mg   4 mg Oral Q6H PRN Domenic Polite, MD       Or  . ondansetron [facility]) injection 4 mg  4 mg Intravenous Q6H PRN Domenic Polite, MD      . pantoprazole (PROTONIX) injection 40 mg  40 mg Intravenous Q12H Domenic Polite, MD   40 mg at 07/12/13 2004  . piperacillin-tazobactam (ZOSYN) IVPB 3.375 g  3.375 g Intravenous Q8H Kara Mead, [facility]          Allergies  Allergen Reactions  . Ceftin Other (See Comments)    Patient stated it caused "sores in mouth"    REVIEW OF SYSTEMS: Skin:  No history of rash.  No history of abnormal moles. Infection:  No history of hepatitis or HIV.  No history of MRSA. Neurologic:  No history of stroke.  No history of seizure.  No history of headaches. Cardiac:  Hypertension.  Had cath about 2007.  Myoview scan was done in 2012. This demonstrated a left ventricular ejection fraction of 61% and inferior and apical thinning likely consistent with attenuation artifact.  Sees Dr. Burt Knack for cards. Pulmonary:  Quit smoking 2012.  Endocrine:  No diabetes. No thyroid disease. Gastrointestinal: See HPI Urologic:  No history of kidney stones.  No history of bladder infections. Musculoskeletal:  Back surgery x 2.  Last Feb 2014 by Dr. Lenna Sciara.  Hematologic:  No bleeding disorder.  No history of anemia.  Not anticoagulated. Psycho-social:  The patient is oriented.   The patient has no obvious psychologic or social impairment to understanding our conversation and plan.  SOCIAL and FAMILY HISTORY: Unmarried. 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 Father had gall bladder surgery around years ago.  PHYSICAL EXAM: BP 127/71  Pulse 84  Temp(Src) 99.8 F (37.7 C) (Oral)  Resp 20  Ht _0  (1.803 m)  Wt 171 lb 15.3 oz (78 kg)  BMI 23.99 kg/m2  SpO2 96%  General: WN WM who is alert and generally healthy appearing.  HEENT: Normal. Pupils equal. Neck: Supple. No mass.  No thyroid mass. Lymph Nodes:  No supraclavicular or cervical nodes. Lungs: Clear to auscultation  and symmetric breath sounds. Heart:  RRR. No murmur or rub. Abdomen: Soft. No mass. Tender RUQ.  No rebound.  Decreased bowel sounds. Extremities:  Good strength and ROM  in upper and lower extremities. Neurologic:  Grossly intact to motor and sensory function. Psychiatric: Has normal mood and affect. Behavior is normal.   DATA REVIEWED: Epic notes.  Alphonsa Overall, MD,  [facility], [place]  [place], [place]    [place] Phone:  [place]

## 2013-07-13 NOTE — Progress Notes (Signed)
Dr. Lucia Gaskins made aware of patient's temperature in PACU- 100.1 F -orally

## 2013-07-14 ENCOUNTER — Encounter (HOSPITAL_COMMUNITY)
Admission: EM | Disposition: A | Discharge status: Home / Self Care | Payer: Self-pay | Source: Home / Self Care | Attending: Internal Medicine

## 2013-07-14 ENCOUNTER — Encounter (HOSPITAL_COMMUNITY): Payer: Self-pay | Admitting: *Deleted

## 2013-07-14 ENCOUNTER — Inpatient Hospital Stay (HOSPITAL_COMMUNITY): Payer: BC Managed Care – PPO | Admitting: Anesthesiology

## 2013-07-14 ENCOUNTER — Inpatient Hospital Stay (HOSPITAL_COMMUNITY): Payer: BC Managed Care – PPO

## 2013-07-14 ENCOUNTER — Encounter (HOSPITAL_COMMUNITY): Payer: BC Managed Care – PPO | Admitting: Anesthesiology

## 2013-07-14 DIAGNOSIS — K8050 Calculus of bile duct without cholangitis or cholecystitis without obstruction: Secondary | ICD-10-CM

## 2013-07-14 HISTORY — PX: ERCP: SHX5425

## 2013-07-14 LAB — COMPREHENSIVE METABOLIC PANEL
ALBUMIN: 2.7 g/dL — AB (ref 3.5–5.2)
ALT: 217 U/L — ABNORMAL HIGH (ref 0–53)
AST: 139 U/L — ABNORMAL HIGH (ref 0–37)
Alkaline Phosphatase: 118 U/L — ABNORMAL HIGH (ref 39–117)
BILIRUBIN TOTAL: 5.4 mg/dL — AB (ref 0.3–1.2)
BUN: 14 mg/dL (ref 6–23)
CO2: 31 mEq/L (ref 19–32)
CREATININE: 1 mg/dL (ref 0.50–1.35)
Calcium: 7.7 mg/dL — ABNORMAL LOW (ref 8.4–10.5)
Chloride: 100 mEq/L (ref 96–112)
GFR calc non Af Amer: 79 mL/min — ABNORMAL LOW (ref >90)
GLUCOSE: 135 mg/dL — AB (ref 70–99)
Potassium: 3.5 mEq/L — ABNORMAL LOW (ref 3.7–5.3)
Sodium: 139 mEq/L (ref 137–147)
TOTAL PROTEIN: 5.4 g/dL — AB (ref 6.0–8.3)

## 2013-07-14 LAB — CBC WITH DIFFERENTIAL/PLATELET
Basophils Absolute: 0 10*3/uL (ref 0.0–0.1)
Basophils Relative: 0 % (ref 0–1)
EOS ABS: 0 10*3/uL (ref 0.0–0.7)
Eosinophils Relative: 0 % (ref 0–5)
HCT: 35.6 % — ABNORMAL LOW (ref 39.0–52.0)
HEMOGLOBIN: 12 g/dL — AB (ref 13.0–17.0)
LYMPHS ABS: 0.7 10*3/uL (ref 0.7–4.0)
Lymphocytes Relative: 7 % — ABNORMAL LOW (ref 12–46)
MCH: 32.5 pg (ref 26.0–34.0)
MCHC: 33.7 g/dL (ref 30.0–36.0)
MCV: 96.5 fL (ref 78.0–100.0)
MONOS PCT: 8 % (ref 3–12)
Monocytes Absolute: 0.8 10*3/uL (ref 0.1–1.0)
Neutro Abs: 8.5 10*3/uL — ABNORMAL HIGH (ref 1.7–7.7)
Neutrophils Relative %: 85 % — ABNORMAL HIGH (ref 43–77)
Platelets: 123 10*3/uL — ABNORMAL LOW (ref 150–400)
RBC: 3.69 MIL/uL — AB (ref 4.22–5.81)
RDW: 13.7 % (ref 11.5–15.5)
WBC: 10.1 10*3/uL (ref 4.0–10.5)

## 2013-07-14 SURGERY — ERCP, WITH INTERVENTION IF INDICATED
Anesthesia: General

## 2013-07-14 MED ORDER — PHENYLEPHRINE HCL 10 MG/ML IJ SOLN
INTRAMUSCULAR | Status: DC | PRN
  Administered 2013-07-14: 80 ug via INTRAVENOUS

## 2013-07-14 MED ORDER — METOPROLOL TARTRATE 1 MG/ML IV SOLN
INTRAVENOUS | Status: AC
  Filled 2013-07-14: qty 5

## 2013-07-14 MED ORDER — PROPOFOL 10 MG/ML IV BOLUS
INTRAVENOUS | Status: AC
  Filled 2013-07-14: qty 20

## 2013-07-14 MED ORDER — ROCURONIUM BROMIDE 100 MG/10ML IV SOLN
INTRAVENOUS | Status: AC
  Filled 2013-07-14: qty 1

## 2013-07-14 MED ORDER — PROMETHAZINE HCL 25 MG/ML IJ SOLN: 6.2500 mg | INTRAMUSCULAR | Status: DC | PRN

## 2013-07-14 MED ORDER — LIDOCAINE HCL (CARDIAC) 20 MG/ML IV SOLN
INTRAVENOUS | Status: AC
Start: 2013-07-14 — End: 2013-07-14
  Filled 2013-07-14: qty 5

## 2013-07-14 MED ORDER — ONDANSETRON HCL 4 MG/2ML IJ SOLN
INTRAMUSCULAR | Status: DC | PRN
  Administered 2013-07-14: 4 mg via INTRAVENOUS

## 2013-07-14 MED ORDER — METOPROLOL TARTRATE 1 MG/ML IV SOLN
INTRAVENOUS | Status: DC | PRN
  Administered 2013-07-14: 1 mg via INTRAVENOUS

## 2013-07-14 MED ORDER — FENTANYL CITRATE 0.05 MG/ML IJ SOLN
INTRAMUSCULAR | Status: DC | PRN
  Administered 2013-07-14 (×2): 50 ug via INTRAVENOUS

## 2013-07-14 MED ORDER — LIDOCAINE HCL (CARDIAC) 20 MG/ML IV SOLN
INTRAVENOUS | Status: DC | PRN
  Administered 2013-07-14: 80 mg via INTRAVENOUS

## 2013-07-14 MED ORDER — PROPOFOL 10 MG/ML IV BOLUS
INTRAVENOUS | Status: DC | PRN
  Administered 2013-07-14: 100 mg via INTRAVENOUS

## 2013-07-14 MED ORDER — NEOSTIGMINE METHYLSULFATE 1 MG/ML IJ SOLN
INTRAMUSCULAR | Status: AC
  Filled 2013-07-14: qty 10

## 2013-07-14 MED ORDER — FENTANYL CITRATE 0.05 MG/ML IJ SOLN
INTRAMUSCULAR | Status: AC
  Filled 2013-07-14: qty 5

## 2013-07-14 MED ORDER — PHENYLEPHRINE 40 MCG/ML (10ML) SYRINGE FOR IV PUSH (FOR BLOOD PRESSURE SUPPORT)
PREFILLED_SYRINGE | INTRAVENOUS | Status: AC
  Filled 2013-07-14: qty 10

## 2013-07-14 MED ORDER — MIDAZOLAM HCL 2 MG/2ML IJ SOLN
INTRAMUSCULAR | Status: AC
  Filled 2013-07-14: qty 2

## 2013-07-14 MED ORDER — ONDANSETRON HCL 4 MG/2ML IJ SOLN
INTRAMUSCULAR | Status: AC
  Filled 2013-07-14: qty 2

## 2013-07-14 MED ORDER — MIDAZOLAM HCL 5 MG/5ML IJ SOLN
INTRAMUSCULAR | Status: DC | PRN
  Administered 2013-07-14: 2 mg via INTRAVENOUS

## 2013-07-14 MED ORDER — POTASSIUM CHLORIDE IN NACL 40-0.9 MEQ/L-% IV SOLN
INTRAVENOUS | Status: DC
  Administered 2013-07-14 – 2013-07-15 (×2): via INTRAVENOUS
  Filled 2013-07-14 (×3): qty 1000

## 2013-07-14 MED ORDER — SODIUM CHLORIDE 0.9 % IV SOLN
INTRAVENOUS | Status: DC | PRN
  Administered 2013-07-14: 11:00:00

## 2013-07-14 MED ORDER — LACTATED RINGERS IV SOLN
INTRAVENOUS | Status: DC | PRN
  Administered 2013-07-14: 10:00:00 via INTRAVENOUS

## 2013-07-14 MED ORDER — FENTANYL CITRATE 0.05 MG/ML IJ SOLN: 25.0000 ug | INTRAMUSCULAR | Status: DC | PRN

## 2013-07-14 MED ORDER — SUCCINYLCHOLINE CHLORIDE 20 MG/ML IJ SOLN
INTRAMUSCULAR | Status: DC | PRN
  Administered 2013-07-14: 100 mg via INTRAVENOUS

## 2013-07-14 MED ORDER — GLYCOPYRROLATE 0.2 MG/ML IJ SOLN
INTRAMUSCULAR | Status: AC
  Filled 2013-07-14: qty 3

## 2013-07-14 NOTE — Preoperative (Signed)
Beta Blockers   Reason not to administer Beta Blockers:Metoprolol IV given intraop.

## 2013-07-14 NOTE — Interval H&P Note (Signed)
History and Physical Interval Note:  07/14/2013 10:05 AM  Jeff Lopez  has presented today for surgery, with the diagnosis of choledocholithiasis  The various methods of treatment have been discussed with the patient and family. After consideration of risks, benefits and other options for treatment, the patient has consented to  Procedure(s): ENDOSCOPIC RETROGRADE CHOLANGIOPANCREATOGRAPHY (ERCP) (N/A) as a surgical intervention .  [name] history has been reviewed, patient examined, no change in status, stable for surgery.  I have reviewed the patient's chart and labs.  Questions were answered to the patient's satisfaction.     [name]

## 2013-07-14 NOTE — Progress Notes (Signed)
Tele strip this morning was labeled 2nd degree HB type 2, but confirmed that patient is sinus rhythm with PVC's and strip was relabled This was confirmed with EKG.

## 2013-07-14 NOTE — Anesthesia Postprocedure Evaluation (Signed)
  Anesthesia Post-op Note  Patient: Jeff Lopez  Procedure(s) Performed: Procedure(s) (LRB): ENDOSCOPIC RETROGRADE CHOLANGIOPANCREATOGRAPHY (ERCP) (N/A)  Patient Location: PACU  Anesthesia Type: General  Level of Consciousness: awake and alert   Airway and Oxygen Therapy: Patient Spontanous Breathing  Post-op Pain: mild  Post-op Assessment: Post-op Vital signs reviewed, Patient's Cardiovascular Status Stable, Respiratory Function Stable, Patent Airway and No signs of Nausea or vomiting  Last Vitals:  Filed Vitals:   07/14/13 1140  BP: 157/110  Pulse:   Temp:   Resp: 16    Post-op Vital Signs: stable   Complications: No apparent anesthesia complications

## 2013-07-14 NOTE — Progress Notes (Signed)
PROGRESS NOTE  Jeff Lopez KDX:[id number] DOB: 09/22/1950 DOA: 07/12/2013 PCP: Renato Shin, MD  Assessment/Plan: Acute cholecystitis - s/p cholecystectomy 1/18 - retained CBD stone s/p ERCP with sphincterotomy/papillotomy 1/19 - advance diet post procedure per GI PVCs -Likely related to hypokalemia, potassium replaced, monitor on telemetry, - cardiac enzymes negative Nonobstructive CAD -Continue aspirin, metoprolol  -Low risk Myoview in 2012, nonobstructive CAD on cath in 2007  Hypertension -hold Hyzaar, hydrate  Diet-controlled diabetes  Diet: NPO Fluids: NS DVT Prophylaxis: SCDs  Code Status: Full Family Communication: none  Disposition Plan: inpatient  Consultants:  Surgery  Procedures:  none   Antibiotics Zosyn 1/18 >>  HPI/Subjective: - continues to have RUQ pain post surgery; anxious about ERCP and home d/c  Objective: Filed Vitals:   07/14/13 1130 07/14/13 1140 07/14/13 1150 07/14/13 1251  BP: 148/83 157/110 160/102 121/68  Pulse:    85  Temp: 98.3 F (36.8 C)   97.9 F (36.6 C)  TempSrc:    Oral  Resp: 21 16 22 18   Height:      Weight:      SpO2: 94% 94% 95% 95%    Intake/Output Summary (Last 24 hours) at 07/14/13 1345 Last data filed at 07/14/13 1110  Gross per 24 hour  Intake   3950 ml  Output     50 ml  Net   3900 ml   Filed Weights   07/12/13 1903  Weight: 78 kg (171 lb 15.3 oz)    Exam:  General:  NAD  Cardiovascular: regular rate and rhythm, without MRG  Respiratory: good air movement, clear to auscultation throughout, no wheezing, ronchi or rales  Abdomen: RUQ tenderness to palpation  MSK: no peripheral edema  Neuro: non focal  Data Reviewed: Basic Metabolic Panel:  Recent Labs Lab 07/12/13 1125 07/12/13 1344 07/12/13 1428 07/13/13 0115 07/14/13 0532  NA 139  --  143 139 139  K 2.7*  --  3.7 2.9* 3.5*  CL 96  --  102 97 100  CO2 25  --   --  27 31  GLUCOSE 179*  --  94 161* 135*  BUN 20  --  12 14  14   CREATININE 0.98  --  1.30 0.94 1.00  CALCIUM 9.4  --   --  7.7* 7.7*  MG  --  1.5  --   --   --    Liver Function Tests:  Recent Labs Lab 07/12/13 1125 07/13/13 0115 07/14/13 0532  AST 13 30 139*  ALT 14 28 217*  ALKPHOS 78 66 118*  BILITOT 1.3* 2.4* 5.4*  PROT 6.8 5.9* 5.4*  ALBUMIN 4.3 3.4* 2.7*    Recent Labs Lab 07/12/13 1125  LIPASE 32   CBC:  Recent Labs Lab 07/12/13 1125 07/12/13 1428 07/13/13 0115 07/14/13 0532  WBC 17.1*  --  17.5* 10.1  NEUTROABS 15.0*  --   --  8.5*  HGB 15.4 19.4* 13.5 12.0*  HCT 42.0 57.0* 37.8* 35.6*  MCV 91.1  --  92.9 96.5  PLT 219  --  184 123*   Cardiac Enzymes:  Recent Labs Lab 07/12/13 2036 07/13/13 0115  TROPONINI <0.30 <0.30   Studies: Dg Cholangiogram Operative  07/13/2013   CLINICAL DATA:  Intraoperative cholangiogram.  EXAM: INTRAOPERATIVE CHOLANGIOGRAM  TECHNIQUE: Cholangiographic images from the C-arm fluoroscopic device were submitted for interpretation post-operatively. Please see the procedural report for the amount of contrast and the fluoroscopy time utilized.  COMPARISON:  Preoperative CT scan of the  abdomen pelvis dated 12 July 2013  FINDINGS: Contrast has filled normal-appearing intrahepatic ducts as well as the and common hepatic duct and common bile duct. There is a long segment filling defect within the and common hepatic duct and proximal common bile duct. A discrete rounded stone is not demonstrated. However, there is very little contrast entering the duodenum. There is a convex border filling defect in the distal aspect of the common bile duct which may reflect a retained stone.  IMPRESSION: 1. There is a long segment filling defect within the common hepatic duct and proximal common bile duct. This may reflect sludge or blood products. 2. Dilated convex bordered filling defect in the distal common bile duct without allowing little drainage of contrast may reflect a retained common bile duct stone.    Electronically Signed   By: David  Martinique   On: 07/13/2013 13:51   Ct Abdomen Pelvis W Contrast  07/12/2013   CLINICAL DATA:  Epigastric pain.  Nausea and vomiting.  EXAM: CT ABDOMEN AND PELVIS WITH CONTRAST  TECHNIQUE: Multidetector CT imaging of the abdomen and pelvis was performed using the standard protocol following bolus administration of intravenous contrast.  CONTRAST:  85mL OMNIPAQUE IOHEXOL 300 MG/ML SOLN, 162mL OMNIPAQUE IOHEXOL 300 MG/ML SOLN  COMPARISON:  None.  FINDINGS: Mild dependent atelectasis is present bilaterally. The lungs are otherwise clear. The heart size is normal. No significant pleural or pericardial effusion is present.  A hyperdense lesion along the posterior dome of the left lobe of the liver is not visualized on the delayed sequences. This likely represents a hemangioma. The spleen is within normal limits. The liver is otherwise unremarkable. The stomach, duodenum, and pancreas are within normal limits. There is prominent wall nasal within the gallbladder and some pericholecystic edema. The adrenal glands are slightly thickened bilaterally without a discrete mass. Exophytic cysts are present in both kidneys. No solid mass lesion is present. The ureters are within normal limits bilaterally. The urinary bladder is unremarkable.  The rectosigmoid colon is within normal limits. The remainder the colon is unremarkable. The appendix is visualized and normal. Small bowel is within normal limits. There is mild prominence of the prostate gland extending into the base of the bladder.  Patient is status post lumbar fusion L2-S1. The hardware is intact. No focal lytic or blastic lesions are present. Incidental note is made of a benign appearing lab, within the obturator internus muscle on the right.  IMPRESSION: 1. Inflammatory changes of the gallbladder suggest cholecystitis. This could be confirmed with ultrasound if clinically indicated. 2. Bilateral exophytic renal cyst. 3. Postsurgical  changes of the lumbar spine. 4. Hyperdense lesion over the dome of the liver likely represents a hemangioma. This area could be further evaluated with ultrasound as well.   Electronically Signed   By: Lawrence Santiago M.D.   On: 07/12/2013 19:25    Scheduled Meds: . aspirin EC  81 mg Oral Daily  . atorvastatin  10 mg Oral Daily  . metoprolol succinate  25 mg Oral Daily  . pantoprazole (PROTONIX) IV  40 mg Intravenous Q12H  . piperacillin-tazobactam (ZOSYN)  IV  3.375 g Intravenous Q8H   Continuous Infusions: . 0.9 % NaCl with KCl 20 mEq / L 100 mL/hr at 07/14/13 [id number]    Active Problems:   CAD, NATIVE VESSEL   LUMBAR DISC DISORDER   COPD GOLD 0   Type II or unspecified type diabetes mellitus without mention of complication, not stated as uncontrolled   Abdominal  pain   Nonspecific (abnormal) findings on radiological and other examination of biliary tract   Calculus of gallbladder with acute cholecystitis, without mention of obstruction   Calculus of bile duct without mention of cholecystitis or obstruction   Choledocholithiasis  Time spent: [place], MD [facility] Pager [phone]. If 7 PM - 7 AM, please contact night-coverage at [email or web address], password [facility] 07/14/2013, 1:45 PM  LOS: 2 days

## 2013-07-14 NOTE — Progress Notes (Signed)
Nutrition Brief Note  Patient identified on the Malnutrition Screening Tool (MST) Report  Wt Readings from Last 15 Encounters:  07/12/13 171 lb 15.3 oz (78 kg)  07/12/13 171 lb 15.3 oz (78 kg)  07/12/13 171 lb 15.3 oz (78 kg)  07/12/13 171 lb 15.3 oz (78 kg)  06/17/13 178 lb (80.74 kg)  05/30/13 178 lb 5 oz (80.882 kg)  11/29/12 180 lb 6 oz (81.818 kg)  11/28/12 178 lb (80.74 kg)  08/06/12 184 lb 8.4 oz (83.7 kg)  08/06/12 184 lb 8.4 oz (83.7 kg)  08/06/12 184 lb 8.4 oz (83.7 kg)  07/30/12 184 lb 8 oz (83.689 kg)  07/04/12 190 lb (86.183 kg)  06/18/12 188 lb (85.276 kg)  06/10/12 188 lb (85.276 kg)    Body mass index is 23.99 kg/(m^2). Patient meets criteria for Normal weight based on current BMI.   Current diet order is Regular, patient is consuming approximately 75% of meals at this time. Labs and medications reviewed.   Pt reported unintentional wt loss of 14 lbs in past month, which pt attributes to need for gallbladder removal. Would force himself to eat, but did experience early satiety and abd pains. Pt s/p cholecystectomy and is tolerating solid foods w/out difficulty and improved appetite.  Provided pt with "Gallbladder Nutrition Therapy" handout and "Fat-Restricted Nutrition Therapy" handout from the Academy of [employer] and Dietetics. Pt appreciated information and verbalized understanding  No additional nutrition interventions warranted at this time. If nutrition issues arise, please consult RD.   Atlee Abide MS RD LDN Clinical Dietitian [phone]

## 2013-07-14 NOTE — Op Note (Signed)
[facility] [place], [address]   ERCP PROCEDURE REPORT  PATIENT: Jeff Lopez, Jeff Lopez.  MR# :[id number] BIRTHDATE: [birth year]  GENDER: Male ENDOSCOPIST: [name], MD PROCEDURE DATE:  07/14/2013 PROCEDURE:   ERCP with sphincterotomy/papillotomy and ERCP with removal of calculus/calculi ASA CLASS:   Class II INDICATIONS:IOC yesterday was + for filling defects. MEDICATIONS: General endotracheal anesthesia (GETA) TOPICAL ANESTHETIC: none  DESCRIPTION OF PROCEDURE:   After the risks benefits and alternatives of the procedure were thoroughly explained, informed consent was obtained.  The Pentax ERCP [id number]  endoscope was introduced through the mouth  and advanced to the second portion of the duodenum without detailed examination of the UGI tract.  There was a small periampullary diverticulum. A 44 Autotome over a 0.35 hydrawire was used to cannulate the bile duct and then contrast was injected.  Cholangiogram showed a single small round distal filling defect. The bile duct was non-dilated, the cyst duct remnant was 3cm long and did not leak contrast.  An adequate biliary sphincterotomy was performed and then the bile duct was swept several times with biliary retrieval balloon. A single, smooth, greenish ovoid stone was delivered into the duodenum. There was no purulence. A completion, occlusion cholangiogram was normal.  The main pancreatic duct was cannulated briefly with wire but never injected with dye.  The scope was then completely withdrawn from the patient and the procedure terminated.    COMPLICATIONS: None  ENDOSCOPIC IMPRESSION: Choledocholithiasis, treated with biliary sphincterotomy and balloon sweeping. The longer filling defects noted on yesterday's IOC were probably related to transient sludge, perhaps blood which has already passed into the duodenum.  RECOMMENDATIONS: Follow clinically.  Probably safe for d/c home tomorrow  if he is recovered enough from his acute cholecysitis and surgery.  I will start clears and advance as tolerated today.    _______________________________ eSigned:  [name], MD 07/14/2013 11:07 AM

## 2013-07-14 NOTE — OR Nursing (Signed)
Pt on room air.  Sats noted to be 88. He states that it hurts to breath.  O2 @ 2L started.  Sats now 94%.  Will continue to monitor.

## 2013-07-14 NOTE — H&P (View-Only) (Signed)
Consult Note   Referring Provider:  Dr. Keturah Barre. Lucia Gaskins Primary Care Physician:  Renato Shin, MD Primary Gastroenterologist:  Dr. Ardis Hughs  Reason for Consultation:  Abnormal IOC  HPI: Jeff Lopez is a 63 y.o. male who underwent developed acute epigastric pain/lower chest pain 2 days ago associated with N/V. CT showed cholecystitis. He underwent lap chole today with IOC this afternoon showing a convex filling defect in the distal CBD with very little drainage and a long segment CHD/CBD filling defect possible sludge or blood. Denies weight loss, constipation, diarrhea, change in stool caliber, melena, hematochezia, dysphagia, reflux symptoms.   Past Medical History  Diagnosis Date  . HYPERLIPIDEMIA 04/09/2007  . HYPERTENSION, BENIGN 04/19/2010  . GERD 04/09/2007  . RENAL CYST 05/27/2010  . OSTEOARTHRITIS, LUMBAR SPINE 04/09/2007  . LUMBAR DISC DISORDER 05/27/2010  . CAD, NATIVE VESSEL 04/19/2010    nonobstructive by cath 2007:  oLAD 20-30%, mLAD 50%, pCFX 20-30%, oAVCFX 20-30%, L renal art 50%;  normal LVF  . [id number](725.3)     Past Surgical History  Procedure Laterality Date  . Spine surgery  2006    C-spine surgery x 2  . Back surgery    . Neck surgery    . Lumbar disc surgery  08/06/2012    L3 & L4  . Foot fracture surgery    . Lumbar laminectomy/decompression microdiscectomy Left 08/10/2012    Procedure: Dura Repair Left Side L2-L3;  Surgeon: [name], MD;  Location: [place];  Service: Orthopedics;  Laterality: Left;  Wilson Frame, Sliding table, dura repair kit, microscope    Prior to Admission medications   Medication Sig Start Date End Date Taking? Authorizing Provider  aspirin EC 81 MG tablet Take 1 tablet (81 mg total) by mouth daily. 06/17/13  Yes Sherren Mocha, MD  atorvastatin (LIPITOR) 10 MG tablet Take 1 tablet (10 mg total) by mouth daily. 11/29/12  Yes Renato Shin, MD  losartan-hydrochlorothiazide (HYZAAR) 100-12.5 MG per tablet Take 1 tablet by mouth  daily.   Yes Historical Provider, MD  metoprolol succinate (TOPROL-XL) 25 MG 24 hr tablet Take 25 mg by mouth daily.   Yes Historical Provider, MD  pantoprazole (PROTONIX) 40 MG tablet Take 40 mg by mouth 2 (two) times daily. Before meals   Yes Historical Provider, MD    Current Facility-Administered Medications  Medication Dose Route Frequency Provider Last Rate Last Dose  . 0.9 % NaCl with KCl 20 mEq/ L  infusion   Intravenous Continuous Domenic Polite, MD 100 mL/hr at 07/13/13 0600    . acetaminophen (TYLENOL) tablet 650 mg  650 mg Oral Q6H PRN Domenic Polite, MD       Or  . acetaminophen (TYLENOL) suppository 650 mg  650 mg Rectal Q6H PRN Domenic Polite, MD      . aspirin EC tablet 81 mg  81 mg Oral Daily Domenic Polite, MD   81 mg at 07/13/13 1515  . atorvastatin (LIPITOR) tablet 10 mg  10 mg Oral Daily Domenic Polite, MD   10 mg at 07/13/13 1042  . gi cocktail (Maalox,Lidocaine,Donnatal)  30 mL Oral TID PRN Domenic Polite, MD   30 mL at 07/12/13 2005  . HYDROmorphone (DILAUDID) injection 2 mg  2 mg Intravenous Q3H PRN Domenic Polite, MD   2 mg at 07/13/13 1206  . metoprolol succinate (TOPROL-XL) 24 hr tablet 25 mg  25 mg Oral Daily Domenic Polite, MD   25 mg at 07/13/13 1043  . morphine 2 MG/ML injection 1-3  mg  1-3 mg Intravenous Q2H PRN Shann Medal, MD      . ondansetron [facility]) tablet 4 mg  4 mg Oral Q6H PRN Domenic Polite, MD       Or  . ondansetron [facility]) injection 4 mg  4 mg Intravenous Q6H PRN Domenic Polite, MD      . pantoprazole (PROTONIX) injection 40 mg  40 mg Intravenous Q12H Domenic Polite, MD   40 mg at 07/13/13 1043  . piperacillin-tazobactam (ZOSYN) IVPB 3.375 g  3.375 g Intravenous [address] [place], [place]   3.375 g at 07/13/13 1515    Allergies as of 07/12/2013 - Review Complete 07/12/2013  Allergen Reaction Noted  . Ceftin Other (See Comments) 07/31/2011    Family History  Problem Relation Age of Onset  . Cancer Mother     Breast Cancer, Uterine  Cancer  . Breast cancer Mother   . Uterine cancer Mother   . Hypertension Brother   . Heart disease Mother     History   Social History  . Marital Status: Single    Spouse Name: N/A    Number of Children: 1  . Years of Education: N/A   Occupational History  . [employer]    Social History Main Topics  . Smoking status: Former Smoker -- 0.50 packs/day for 44 years    Types: Cigarettes    Quit date: 04/06/2012  . Smokeless tobacco: Never Used     Comment: pt has stopped smoking about 9 months now  . Alcohol Use: No  . Drug Use: No  . Sexual Activity: Not Currently   Other Topics Concern  . Not on file   Social History Narrative   Works in Psychologist, educational.    Review of Systems: Gen: Denies any fever, chills, sweats, anorexia, fatigue, weakness, malaise, weight loss, and sleep disorder CV: Denies chest pain, angina, palpitations, syncope, orthopnea, PND, peripheral edema, and claudication. Resp: Denies dyspnea at rest, dyspnea with exercise, cough, sputum, wheezing, coughing up blood, and pleurisy. GI: Denies vomiting blood, jaundice, and fecal incontinence.   Denies dysphagia or odynophagia. GU : Denies urinary burning, blood in urine, urinary frequency, urinary hesitancy, nocturnal urination, and urinary incontinence. MS: Denies joint pain, limitation of movement, and swelling, stiffness, low back pain, extremity pain. Denies muscle weakness, cramps, atrophy.  Derm: Denies rash, itching, dry skin, hives, moles, warts, or unhealing ulcers.  Psych: Denies depression, anxiety, memory loss, suicidal ideation, hallucinations, paranoia, and confusion. Heme: Denies bruising, bleeding, and enlarged lymph nodes. Neuro:  Denies any headaches, dizziness, paresthesias. Endo:  Denies any problems with DM, thyroid, adrenal function.  Physical Exam: Vital signs in last 24 hours: Temp:  [97.6 F (36.4 C)-100.3 F (37.9 C)] 97.6 F (36.4 C) (01/18 1518) Pulse Rate:  [75-96] 75  (01/18 1518) Resp:  [12-20] 16 (01/18 1518) BP: (115-165)/(58-82) 126/75 mmHg (01/18 1518) SpO2:  [96 %-99 %] 97 % (01/18 1518) Weight:  [171 lb 15.3 oz (78 kg)] 171 lb 15.3 oz (78 kg) (01/17 1903) Last BM Date: 07/11/13 General:   Alert, well-developed, well-nourished, pleasant and cooperative in NAD Head:  Normocephalic and atraumatic. Eyes:  Sclera clear, no icterus.   Conjunctiva pink. Ears:  Normal auditory acuity. Nose:  No deformity, discharge, or lesions. Mouth:  No deformity or lesions.  Oropharynx pink & moist. Neck:  Supple; no masses or thyromegaly. Lungs:  Clear throughout to auscultation.   No wheezes, crackles, or rhonchi. No acute distress. Heart:  Regular rate and rhythm;  no murmurs, clicks, rubs,  or gallops. Abdomen:  Soft, mild tenderness near lap chole incisions and mildly distended. No masses, hepatosplenomegaly or hernias noted. Normal bowel sounds, without guarding, and without rebound.   Rectal:  Deferred   Msk:  Symmetrical without gross deformities. Normal posture. Pulses:  Normal pulses noted. Extremities:  Without clubbing or edema. Neurologic:  Alert and  oriented x4;  grossly normal neurologically. Skin:  Intact without significant lesions or rashes. Cervical Nodes:  No significant cervical adenopathy. Psych:  Alert and cooperative. Normal mood and affect.  Intake/Output from previous day: 01/17 0701 - 01/18 0700 In: 1290 [I.V.:990; IV Piggyback:300] Out: -  Intake/Output this shift: Total I/O In: 1600 [I.V.:1500; IV Piggyback:100] Out: 160 [Urine:110; Blood:50]  Lab Results:  Recent Labs  07/12/13 1125 07/12/13 1428 07/13/13 0115  WBC 17.1*  --  17.5*  HGB 15.4 19.4* 13.5  HCT 42.0 57.0* 37.8*  PLT 219  --  184   BMET  Recent Labs  07/12/13 1125 07/12/13 1428 07/13/13 0115  NA 139 143 139  K 2.7* 3.7 2.9*  CL 96 102 97  CO2 25  --  27  GLUCOSE 179* 94 161*  BUN _0 CREATININE 0.98 1.30 0.94  CALCIUM 9.4  --  7.7*    LFT  Recent Labs  07/13/13 0115  PROT 5.9*  ALBUMIN 3.4*  AST 30  ALT 28  ALKPHOS 66  BILITOT 2.4*    Studies/Results: Dg Chest 2 View  07/12/2013   CLINICAL DATA:  Chest pain and dizziness since last night  EXAM: CHEST  2 VIEW  COMPARISON:  02/06/2012  FINDINGS: The heart size and mediastinal contours are within normal limits. Both lungs are clear. The visualized skeletal structures are unremarkable.  IMPRESSION: No active cardiopulmonary disease.   Electronically Signed   By: Skipper Cliche M.D.   On: 07/12/2013 11:22   Dg Cholangiogram Operative  07/13/2013   CLINICAL DATA:  Intraoperative cholangiogram.  EXAM: INTRAOPERATIVE CHOLANGIOGRAM  TECHNIQUE: Cholangiographic images from the C-arm fluoroscopic device were submitted for interpretation post-operatively. Please see the procedural report for the amount of contrast and the fluoroscopy time utilized.  COMPARISON:  Preoperative CT scan of the abdomen pelvis dated 12 July 2013  FINDINGS: Contrast has filled normal-appearing intrahepatic ducts as well as the and common hepatic duct and common bile duct. There is a long segment filling defect within the and common hepatic duct and proximal common bile duct. A discrete rounded stone is not demonstrated. However, there is very little contrast entering the duodenum. There is a convex border filling defect in the distal aspect of the common bile duct which may reflect a retained stone.  IMPRESSION: 1. There is a long segment filling defect within the common hepatic duct and proximal common bile duct. This may reflect sludge or blood products. 2. Dilated convex bordered filling defect in the distal common bile duct without allowing little drainage of contrast may reflect a retained common bile duct stone.   Electronically Signed   By: David  Martinique   On: 07/13/2013 13:51   Ct Abdomen Pelvis W Contrast  07/12/2013   CLINICAL DATA:  Epigastric pain.  Nausea and vomiting.  EXAM: CT ABDOMEN AND  PELVIS WITH CONTRAST  TECHNIQUE: Multidetector CT imaging of the abdomen and pelvis was performed using the standard protocol following bolus administration of intravenous contrast.  CONTRAST:  71m OMNIPAQUE IOHEXOL 300 MG/ML SOLN, 1031mOMNIPAQUE IOHEXOL 300 MG/ML SOLN  COMPARISON:  None.  FINDINGS: Mild dependent atelectasis is present bilaterally. The lungs are otherwise clear. The heart size is normal. No significant pleural or pericardial effusion is present.  A hyperdense lesion along the posterior dome of the left lobe of the liver is not visualized on the delayed sequences. This likely represents a hemangioma. The spleen is within normal limits. The liver is otherwise unremarkable. The stomach, duodenum, and pancreas are within normal limits. There is prominent wall nasal within the gallbladder and some pericholecystic edema. The adrenal glands are slightly thickened bilaterally without a discrete mass. Exophytic cysts are present in both kidneys. No solid mass lesion is present. The ureters are within normal limits bilaterally. The urinary bladder is unremarkable.  The rectosigmoid colon is within normal limits. The remainder the colon is unremarkable. The appendix is visualized and normal. Small bowel is within normal limits. There is mild prominence of the prostate gland extending into the base of the bladder.  Patient is status post lumbar fusion L2-S1. The hardware is intact. No focal lytic or blastic lesions are present. Incidental note is made of a benign appearing lab, within the obturator internus muscle on the right.  IMPRESSION: 1. Inflammatory changes of the gallbladder suggest cholecystitis. This could be confirmed with ultrasound if clinically indicated. 2. Bilateral exophytic renal cyst. 3. Postsurgical changes of the lumbar spine. 4. Hyperdense lesion over the dome of the liver likely represents a hemangioma. This area could be further evaluated with ultrasound as well.   Electronically  Signed   By: Lawrence Santiago M.D.   On: 07/12/2013 19:25    Previous Endoscopies: Colon 08/2011 showing 2 TAs and internal/external hemorrhoids EGD 07/3008 HH, irregular z-line   Impression/Recommendations:  1. S/P lap chole today for cholecystitis/cholelithiasis.   2. Distal CBD stone. CBD/CHD filling defect, possibly sludge. T bili is mildly elevated but other LFTs are normal. Repeat LFTs. Will try to arrange ERCP for  tomorrow.   3. GERD. Continue daily PPI.  4. Hypokalemia. Replace.    LOS: 1 day   Robby Pirani T. Fuller Plan MD [facility] 07/13/2013, 3:51 PM

## 2013-07-14 NOTE — Transfer of Care (Signed)
Immediate Anesthesia Transfer of Care Note  Patient: Jeff Lopez  Procedure(s) Performed: Procedure(s) (LRB): ENDOSCOPIC RETROGRADE CHOLANGIOPANCREATOGRAPHY (ERCP) (N/A)  Patient Location: PACU  Anesthesia Type: General  Level of Consciousness: sedated, patient cooperative and responds to stimulation  Airway & Oxygen Therapy: Patient Spontanous Breathing and Patient connected to face mask oxgen  Post-op Assessment: Report given to PACU RN and Post -op Vital signs reviewed and stable  Post vital signs: Reviewed and stable  Complications: No apparent anesthesia complications

## 2013-07-14 NOTE — Care Management Note (Addendum)
    Page 1 of 1   07/15/2013     12:03:57 PM   CARE MANAGEMENT NOTE 07/15/2013  Patient:  Jeff Lopez, Jeff Lopez   Account Number:  [id number]  Date Initiated:  07/14/2013  Documentation initiated by:  Sherrin Daisy  Subjective/Objective Assessment:   dx epigastric paqin, vomiting, diarrhea     Action/Plan:   From Home   Anticipated DC Date:  07/15/2013   Anticipated DC Plan:  [place]  CM consult      Choice offered to / List presented to:             Status of service:  Completed, signed off Medicare Important Message given?   (If response is "NO", the following Medicare IM given date fields will be blank) Date Medicare IM given:   Date Additional Medicare IM given:    Discharge Disposition:  HOME/SELF CARE  Per UR Regulation:  Reviewed for med. necessity/level of care/duration of stay  If discussed at [place] of Stay Meetings, dates discussed:    Comments:  07/15/13 Estus Krakowski RN,BSN NCM [phone] NO D/C NEEDS OR ORDERS.

## 2013-07-14 NOTE — Anesthesia Preprocedure Evaluation (Signed)
Anesthesia Evaluation  Patient identified by MRN, date of birth, ID band Patient awake    Reviewed: Allergy & Precautions, H&P , NPO status , Patient's Chart, lab work & pertinent test results  Airway Mallampati: II TM Distance: <3 FB Neck ROM: Full    Dental no notable dental hx.    Pulmonary COPDformer smoker,  breath sounds clear to auscultation  Pulmonary exam normal       Cardiovascular hypertension, Pt. on medications Rhythm:Regular Rate:Normal     Neuro/Psych negative neurological ROS  negative psych ROS   GI/Hepatic negative GI ROS, Neg liver ROS,   Endo/Other  diabetes, Type 2  Renal/GU negative Renal ROS  negative genitourinary   Musculoskeletal negative musculoskeletal ROS (+)   Abdominal   Peds negative pediatric ROS (+)  Hematology  (+) anemia ,   Anesthesia Other Findings   Reproductive/Obstetrics negative OB ROS                           Anesthesia Physical Anesthesia Plan  ASA: III  Anesthesia Plan: General   Post-op Pain Management:    Induction: Intravenous  Airway Management Planned: Oral ETT  Additional Equipment:   Intra-op Plan:   Post-operative Plan: Extubation in OR  Informed Consent: I have reviewed the patients History and Physical, chart, labs and discussed the procedure including the risks, benefits and alternatives for the proposed anesthesia with the patient or authorized representative who has indicated his/her understanding and acceptance.   Dental advisory given  Plan Discussed with: CRNA and Surgeon  Anesthesia Plan Comments:         Anesthesia Quick Evaluation

## 2013-07-14 NOTE — Progress Notes (Signed)
Day of Surgery  Subjective:  Just returned from ERCP. Apparently sphincterotomy successful and a single stone retrieved. He is awake and lower and without nausea. He still has some pain but no different than yesterday. Asking about the discharge planning process. Objective: Vital signs in last 24 hours: Temp:  [97.6 F (36.4 C)-100.2 F (37.9 C)] 97.9 F (36.6 C) (01/19 1251) Pulse Rate:  [75-85] 85 (01/19 1251) Resp:  [12-23] 18 (01/19 1251) BP: (115-160)/(58-110) 121/68 mmHg (01/19 1251) SpO2:  [92 %-99 %] 95 % (01/19 1251) Last BM Date: 07/10/13  Intake/Output from previous day: 01/18 0701 - 01/19 0700 In: 4050 [I.V.:3900; IV Piggyback:150] Out: 160 [Urine:110; Blood:50] Intake/Output this shift: Total I/O In: 900 [I.V.:900] Out: -   General appearance: a lower. Cooperative. Mental status normal. No significant distress. GI: small. Mild tenderness around trocar sites. Not distended. Unremarkable postop exam.  Lab Results:  Results for orders placed during the hospital encounter of 07/12/13 (from the past 24 hour(s))  GLUCOSE, CAPILLARY     Status: Abnormal   Collection Time    07/13/13  2:24 PM      Result Value Range   Glucose-Capillary 146 (*) 70 - 99 mg/dL   Comment 1 Documented in Chart    CBC WITH DIFFERENTIAL     Status: Abnormal   Collection Time    07/14/13  5:32 AM      Result Value Range   WBC 10.1  4.0 - 10.5 K/uL   RBC 3.69 (*) 4.22 - 5.81 MIL/uL   Hemoglobin 12.0 (*) 13.0 - 17.0 g/dL   HCT 35.6 (*) 39.0 - 52.0 %   MCV 96.5  78.0 - 100.0 fL   MCH 32.5  26.0 - 34.0 pg   MCHC 33.7  30.0 - 36.0 g/dL   RDW 13.7  11.5 - 15.5 %   Platelets 123 (*) 150 - 400 K/uL   Neutrophils Relative % 85 (*) 43 - 77 %   Neutro Abs 8.5 (*) 1.7 - 7.7 K/uL   Lymphocytes Relative 7 (*) 12 - 46 %   Lymphs Abs 0.7  0.7 - 4.0 K/uL   Monocytes Relative 8  3 - 12 %   Monocytes Absolute 0.8  0.1 - 1.0 K/uL   Eosinophils Relative 0  0 - 5 %   Eosinophils Absolute 0.0  0.0 - 0.7  K/uL   Basophils Relative 0  0 - 1 %   Basophils Absolute 0.0  0.0 - 0.1 K/uL  COMPREHENSIVE METABOLIC PANEL     Status: Abnormal   Collection Time    07/14/13  5:32 AM      Result Value Range   Sodium 139  137 - 147 mEq/L   Potassium 3.5 (*) 3.7 - 5.3 mEq/L   Chloride 100  96 - 112 mEq/L   CO2 31  19 - 32 mEq/L   Glucose, Bld 135 (*) 70 - 99 mg/dL   BUN 14  6 - 23 mg/dL   Creatinine, Ser 1.00  0.50 - 1.35 mg/dL   Calcium 7.7 (*) 8.4 - 10.5 mg/dL   Total Protein 5.4 (*) 6.0 - 8.3 g/dL   Albumin 2.7 (*) 3.5 - 5.2 g/dL   AST 139 (*) 0 - 37 U/L   ALT 217 (*) 0 - 53 U/L   Alkaline Phosphatase 118 (*) 39 - 117 U/L   Total Bilirubin 5.4 (*) 0.3 - 1.2 mg/dL   GFR calc non Af Amer 79 (*) >90 mL/min  GFR calc Af Amer >90  >90 mL/min     Studies/Results: @RISRSLT24 @  . aspirin EC  81 mg Oral Daily  . atorvastatin  10 mg Oral Daily  . metoprolol succinate  25 mg Oral Daily  . pantoprazole (PROTONIX) IV  40 mg Intravenous Q12H  . piperacillin-tazobactam (ZOSYN)  IV  3.375 g Intravenous Q8H     Assessment/Plan: s/p Procedure(s): ENDOSCOPIC RETROGRADE CHOLANGIOPANCREATOGRAPHY (ERCP)  POD #1. Laparoscopic cholecystectomy with cholangiogram. Severe acute gangrenous cholecystitis.  Stable. Advance diet and activities  Choledocholithiasis. ERCP successful today with sphincterotomy and stone removal. Appear stable post procedure Continue antibodies Advance diet as allowed by GI   Nonobstructive CAD Hypertension Lumbar disc disease Diet-controlled diabetes   @PROBHOSP @  LOS: 2 days    Jeff Lopez M 07/14/2013  . .prob

## 2013-07-15 ENCOUNTER — Encounter (HOSPITAL_COMMUNITY): Payer: Self-pay | Admitting: Gastroenterology

## 2013-07-15 LAB — COMPREHENSIVE METABOLIC PANEL
ALT: 134 U/L — AB (ref 0–53)
AST: 41 U/L — AB (ref 0–37)
Albumin: 2.8 g/dL — ABNORMAL LOW (ref 3.5–5.2)
Alkaline Phosphatase: 106 U/L (ref 39–117)
BUN: 13 mg/dL (ref 6–23)
CALCIUM: 7.9 mg/dL — AB (ref 8.4–10.5)
CHLORIDE: 101 meq/L (ref 96–112)
CO2: 26 meq/L (ref 19–32)
Creatinine, Ser: 0.82 mg/dL (ref 0.50–1.35)
GFR calc Af Amer: >90 mL/min (ref >90)
Glucose, Bld: 139 mg/dL — ABNORMAL HIGH (ref 70–99)
Potassium: 3.3 mEq/L — ABNORMAL LOW (ref 3.7–5.3)
Sodium: 138 mEq/L (ref 137–147)
Total Bilirubin: 2.6 mg/dL — ABNORMAL HIGH (ref 0.3–1.2)
Total Protein: 5.7 g/dL — ABNORMAL LOW (ref 6.0–8.3)

## 2013-07-15 LAB — CBC
HCT: 35 % — ABNORMAL LOW (ref 39.0–52.0)
Hemoglobin: 12 g/dL — ABNORMAL LOW (ref 13.0–17.0)
MCH: 32.5 pg (ref 26.0–34.0)
MCHC: 34.3 g/dL (ref 30.0–36.0)
MCV: 94.9 fL (ref 78.0–100.0)
PLATELETS: 137 10*3/uL — AB (ref 150–400)
RBC: 3.69 MIL/uL — AB (ref 4.22–5.81)
RDW: 13.3 % (ref 11.5–15.5)
WBC: 8.7 10*3/uL (ref 4.0–10.5)

## 2013-07-15 LAB — GLUCOSE, CAPILLARY: Glucose-Capillary: 141 mg/dL — ABNORMAL HIGH (ref 70–99)

## 2013-07-15 LAB — LIPASE, BLOOD: Lipase: 20 U/L (ref 11–59)

## 2013-07-15 MED ORDER — SALINE SPRAY 0.65 % NA SOLN
1.0000 | NASAL | Status: DC | PRN
  Administered 2013-07-15: 1 via NASAL
  Filled 2013-07-15: qty 44

## 2013-07-15 MED ORDER — IBUPROFEN 600 MG PO TABS
600.0000 mg | ORAL_TABLET | Freq: Four times a day (QID) | ORAL | Status: DC | PRN
  Filled 2013-07-15: qty 1

## 2013-07-15 MED ORDER — POTASSIUM CHLORIDE CRYS ER 20 MEQ PO TBCR
40.0000 meq | EXTENDED_RELEASE_TABLET | Freq: Once | ORAL | Status: AC
  Administered 2013-07-15: 40 meq via ORAL
  Filled 2013-07-15: qty 2

## 2013-07-15 MED ORDER — AMOXICILLIN-POT CLAVULANATE 875-125 MG PO TABS: 1.0000 | ORAL_TABLET | Freq: Two times a day (BID) | ORAL | Status: DC

## 2013-07-15 MED ORDER — POTASSIUM CHLORIDE ER 10 MEQ PO TBCR: 20.0000 meq | EXTENDED_RELEASE_TABLET | Freq: Every day | ORAL | Status: DC

## 2013-07-15 MED ORDER — OXYCODONE-ACETAMINOPHEN 5-325 MG PO TABS: 1.0000 | ORAL_TABLET | ORAL | Status: DC | PRN

## 2013-07-15 NOTE — Progress Notes (Signed)
    Progress Note   Subjective  minimal abdominal discomfort   Objective   Vital signs in last 24 hours: Temp:  [97.9 F (36.6 C)-98.4 F (36.9 C)] 98.2 F (36.8 C) (01/20 0606) Pulse Rate:  [82-87] 87 (01/20 0606) Resp:  [16-23] 20 (01/20 0606) BP: (121-160)/(62-110) 151/82 mmHg (01/20 0606) SpO2:  [92 %-99 %] 92 % (01/20 0606) Last BM Date: 07/11/13 General:    Pleasant white male in NAD Lungs: Respirations even and unlabored, lungs CTA bilaterally Abdomen:  Soft, nontender and nondistended. Normal bowel sounds. Extremities:  Without edema. Neurologic:  Alert and oriented,  grossly normal neurologically. Psych:  Cooperative. Normal mood and affect.  Lab Results:  Recent Labs  07/13/13 0115 07/14/13 0532 07/15/13 0554  WBC 17.5* 10.1 8.7  HGB 13.5 12.0* 12.0*  HCT 37.8* 35.6* 35.0*  PLT 184 123* 137*   BMET  Recent Labs  07/13/13 0115 07/14/13 0532 07/15/13 0554  NA 139 139 138  K 2.9* 3.5* 3.3*  CL 97 100 101  CO2 27 31 26   GLUCOSE 161* 135* 139*  BUN 14 14 13   CREATININE 0.94 1.00 0.82  CALCIUM 7.7* 7.7* 7.9*   LFT  Recent Labs  07/15/13 0554  PROT 5.7*  ALBUMIN 2.8*  AST 41*  ALT 134*  ALKPHOS 106  BILITOT 2.6*   Studies/Results:  ERCP ENDOSCOPIC IMPRESSION:  Choledocholithiasis, treated with biliary sphincterotomy and balloon  sweeping. The longer filling defects noted on yesterday's IOC were  probably related to transient sludge, perhaps blood which has  already passed into the duodenu    Assessment / Plan:   1. Cholecystitis / cholelithiasis, s/p lap chole.  2. Choledocholithiasis, s/p ERCP with sphincterotomy and balloon sweeping. With removal of one stone. LFTs improving, bili down from 5.4 to 2.6. Hopefully home soon    LOS: 3 days   Tye Savoy  07/15/2013, 9:36 AM

## 2013-07-15 NOTE — Progress Notes (Signed)
General surgery attending note:  I have personally interviewed and examined this patient this morning. I agree with the assessment and treatment plan outlined by Mr. Creig Hines PA.  The patient is stable from a surgical standpoint he may be discharged. I discussed diet and activities with him. He may shower. He will followup with Dr. Alphonsa Overall in 2-3 weeks.  Edsel Petrin. Dalbert Batman, M.D., [facility] General and Minimally invasive Surgery Breast and Colorectal Surgery Office:   [phone] Pager:   [phone]

## 2013-07-15 NOTE — Discharge Summary (Signed)
Physician Discharge Summary  ZACHERIE HONEYMAN IZT:[id number] DOB: January 05, 1951 DOA: 07/12/2013  PCP: Renato Shin, MD  Admit date: 07/12/2013 Discharge date: 07/15/2013  Time spent: 35 minutes  Recommendations for Outpatient Follow-up:  1. Follow up with PCP in 1 week 2. Follow up with surgery as below   Recommendations for primary care physician for things to follow:  Recheck BMP for K level  Discharge Diagnoses:  Active Problems:   CAD, NATIVE VESSEL   LUMBAR DISC DISORDER   COPD GOLD 0   Type II or unspecified type diabetes mellitus without mention of complication, not stated as uncontrolled   Abdominal pain   Nonspecific (abnormal) findings on radiological and other examination of biliary tract   Calculus of gallbladder with acute cholecystitis, without mention of obstruction   Calculus of bile duct without mention of cholecystitis or obstruction   Choledocholithiasis  Discharge Condition: stable  Diet recommendation: as tolerated  Filed Weights   07/12/13 1903  Weight: 78 kg (171 lb 15.3 oz)   History of present illness:  Jeff Lopez is a 63 y.o. male Past medical history of nonobstructive CAD, hypertension, lumbar disc disease, diet-controlled diabetes was in his usual state of health until yesterday.  He went to bed after supper and subsequently started experiencing severe epigastric and lower chest Pain, the pain is described as sharp radiating down to the rest of his abdomen. pain is not pleuritic. This was associated with 4-5 episodes of vomiting, nonbloody nonbilious and 2 episodes of diarrhea. He denied any melena. She denies any fevers, reports noticing some chills. He denies eating any uncooked food, no recent travel, no sick contacts. Finally presented to the emergency room noted to have hypokalemia, mild leukocytosis, PVCs and EKG with nonspecific findings, troponin negative x2, 2 hours apart.   Hospital Course:  Acute cholecystitis - patient's abdominal  pain was epigastric on presentation, and localized to the right upper quadrant by the next morning. He underwent a CT abdomen pelvis which was evident for acute cholecystitis. Surgery has been consulted, and patient underwent a cholecystectomy on 1/18. Intraoperative cholangiogram was abnormal, and gastroenterology has been consulted for ERCP. Patient underwent ERCP with sphincterotomy/papillotomy on 1/19. His symptoms have improved significantly following the cholecystectomy and ERCP, he was tolerating a diet without nausea vomiting or worsening abdominal pain. He was discharged home in stable condition, and he is to followup with his primary care provider as well as surgery and GI. PVCs -Likely related to hypokalemia, potassium replaced, monitor on telemetry. Patient was given a prescription for 20 mEq of potassium daily for 5 days, and was advised to followup with his primary care provider for repeat BMP another potassium check. Likely his hypokalemia was due to poor by mouth intake prior to admission Nonobstructive CAD -Continue aspirin, metoprolol  -Low risk Myoview in 2012, nonobstructive CAD on cath in 2007  Hypertension -hold Hyzaar, hydrate  Diet-controlled diabetes  Procedures:  Cholecystectomy 1/18  ERCP 1/19   Consultations:  Surgery  Gastroenterology  Discharge Exam: Filed Vitals:   07/14/13 1150 07/14/13 1251 07/14/13 2302 07/15/13 0606  BP: 160/102 121/68 131/62 151/82  Pulse:  85 82 87  Temp:  97.9 F (36.6 C) 98.4 F (36.9 C) 98.2 F (36.8 C)  TempSrc:  Oral Oral Oral  Resp: 22 18 20 20   Height:      Weight:      SpO2: 95% 95% 93% 92%   General: No acute distress Cardiovascular: Regular rate and rhythm, without murmurs rubs  or gallops Respiratory: Clear to auscultation bilaterally  Discharge Instructions     Medication List         amoxicillin-clavulanate 875-125 MG per tablet  Commonly known as:  AUGMENTIN  Take 1 tablet by mouth 2 (two) times daily.       aspirin EC 81 MG tablet  Take 1 tablet (81 mg total) by mouth daily.     atorvastatin 10 MG tablet  Commonly known as:  LIPITOR  Take 1 tablet (10 mg total) by mouth daily.     losartan-hydrochlorothiazide 100-12.5 MG per tablet  Commonly known as:  HYZAAR  Take 1 tablet by mouth daily.     metoprolol succinate 25 MG 24 hr tablet  Commonly known as:  TOPROL-XL  Take 25 mg by mouth daily.     oxyCODONE-acetaminophen 5-325 MG per tablet  Commonly known as:  PERCOCET/ROXICET  Take 1-2 tablets by mouth every 4 (four) hours as needed for moderate pain.     pantoprazole 40 MG tablet  Commonly known as:  PROTONIX  Take 40 mg by mouth 2 (two) times daily. Before meals     potassium chloride 10 MEQ tablet  Commonly known as:  K-DUR  Take 2 tablets (20 mEq total) by mouth daily.           Follow-up Information   Follow up with [facility], MD. Schedule an appointment as soon as possible for a visit in 2 weeks.   Specialty:  General Surgery   Contact information:   [address] [place] [address] [phone]       Follow up with Renato Shin, MD. Schedule an appointment as soon as possible for a visit in 1 week.   Specialty:  Endocrinology   Contact information:   [address] [place] [address] [phone]       The results of significant diagnostics from this hospitalization (including imaging, microbiology, ancillary and laboratory) are listed below for reference.    Significant Diagnostic Studies: Dg Chest 2 View  07/12/2013   CLINICAL DATA:  Chest pain and dizziness since last night  EXAM: CHEST  2 VIEW  COMPARISON:  02/06/2012  FINDINGS: The heart size and mediastinal contours are within normal limits. Both lungs are clear. The visualized skeletal structures are unremarkable.  IMPRESSION: No active cardiopulmonary disease.   Electronically Signed   By: Skipper Cliche M.D.   On: 07/12/2013 11:22   Dg Cholangiogram  Operative  07/13/2013   CLINICAL DATA:  Intraoperative cholangiogram.  EXAM: INTRAOPERATIVE CHOLANGIOGRAM  TECHNIQUE: Cholangiographic images from the C-arm fluoroscopic device were submitted for interpretation post-operatively. Please see the procedural report for the amount of contrast and the fluoroscopy time utilized.  COMPARISON:  Preoperative CT scan of the abdomen pelvis dated 12 July 2013  FINDINGS: Contrast has filled normal-appearing intrahepatic ducts as well as the and common hepatic duct and common bile duct. There is a long segment filling defect within the and common hepatic duct and proximal common bile duct. A discrete rounded stone is not demonstrated. However, there is very little contrast entering the duodenum. There is a convex border filling defect in the distal aspect of the common bile duct which may reflect a retained stone.  IMPRESSION: 1. There is a long segment filling defect within the common hepatic duct and proximal common bile duct. This may reflect sludge or blood products. 2. Dilated convex bordered filling defect in the distal common bile duct without allowing  little drainage of contrast may reflect a retained common bile duct stone.   Electronically Signed   By: David  Martinique   On: 07/13/2013 13:51   Ct Lumbar Spine Wo Contrast  06/25/2013   CLINICAL DATA:  Low back with bilateral leg pain for 6 months. History of lumbar fusion in February 2014 and in 2012.  EXAM: CT LUMBAR SPINE WITHOUT CONTRAST  TECHNIQUE: Multidetector CT imaging of the lumbar spine was performed without intravenous contrast administration. Multiplanar CT image reconstructions were also generated.  COMPARISON:  Lumbar myelogram CT 08/09/2012 and routine CT 03/14/2013.  FINDINGS: The alignment is stable status post laminectomy and PLIF from L2 through S1. Lucency surrounding the bilateral L2 pedicle screws, and to a lesser degree the S1 pedicle screws, is unchanged. The additional hardware is well  seated. The alignment is stable. There is progressive incorporation of the L2-3 interbody spacer into both endplates. The interbody fusion at L3-4 and L4-5 remains solid. The L5-S1 spacer remains incorporated into the inferior endplate of L5 and again demonstrates a possible small area bridging with the superior endplate of S1 on the left. Overall appearance is unchanged.  The posterior paraspinal soft tissues are stable within the laminectomy bed. No anterior paraspinal abnormalities are identified.  T12-L1:  Normal interspace.  L1-2: There is a foraminal/extraforaminal disc extrusion on the left causing left L1 nerve root encroachment. The right foramen is patent.  L2-3: Stable postsurgical changes following left laminectomy and facetectomy. No recurrent spinal stenosis or nerve root encroachment.  L3-4: Stable postsurgical changes status post laminectomy and left facetectomy. No recurrent spinal stenosis or nerve root encroachment.  L4-5: Stable postsurgical changes status post laminectomy and left facetectomy. No recurrent spinal stenosis or nerve root encroachment.  L5-S1: Stable postsurgical changes status post wide bilateral laminectomy. The spinal canal is well-decompressed. There is stable mild chronic left foraminal stenosis.  IMPRESSION: 1. Stable postsurgical changes following laminectomy and PLIF from L2 through S1. There is still mild lucency surrounding the L2 and S1 pedicle screws, not progressive. There appears to be progressive incorporation of the L2-3 spacer into the adjacent endplates. 2. Left foraminal/extraforaminal disc extrusion at L1-2 with resulting left L1 nerve root encroachment. 3. No significant recurrent spinal stenosis or nerve root encroachment at the operative levels.   Electronically Signed   By: Camie Patience M.D.   On: 06/25/2013 11:26   Ct Abdomen Pelvis W Contrast  07/12/2013   CLINICAL DATA:  Epigastric pain.  Nausea and vomiting.  EXAM: CT ABDOMEN AND PELVIS WITH CONTRAST   TECHNIQUE: Multidetector CT imaging of the abdomen and pelvis was performed using the standard protocol following bolus administration of intravenous contrast.  CONTRAST:  52mL OMNIPAQUE IOHEXOL 300 MG/ML SOLN, 171mL OMNIPAQUE IOHEXOL 300 MG/ML SOLN  COMPARISON:  None.  FINDINGS: Mild dependent atelectasis is present bilaterally. The lungs are otherwise clear. The heart size is normal. No significant pleural or pericardial effusion is present.  A hyperdense lesion along the posterior dome of the left lobe of the liver is not visualized on the delayed sequences. This likely represents a hemangioma. The spleen is within normal limits. The liver is otherwise unremarkable. The stomach, duodenum, and pancreas are within normal limits. There is prominent wall nasal within the gallbladder and some pericholecystic edema. The adrenal glands are slightly thickened bilaterally without a discrete mass. Exophytic cysts are present in both kidneys. No solid mass lesion is present. The ureters are within normal limits bilaterally. The urinary bladder is unremarkable.  The  rectosigmoid colon is within normal limits. The remainder the colon is unremarkable. The appendix is visualized and normal. Small bowel is within normal limits. There is mild prominence of the prostate gland extending into the base of the bladder.  Patient is status post lumbar fusion L2-S1. The hardware is intact. No focal lytic or blastic lesions are present. Incidental note is made of a benign appearing lab, within the obturator internus muscle on the right.  IMPRESSION: 1. Inflammatory changes of the gallbladder suggest cholecystitis. This could be confirmed with ultrasound if clinically indicated. 2. Bilateral exophytic renal cyst. 3. Postsurgical changes of the lumbar spine. 4. Hyperdense lesion over the dome of the liver likely represents a hemangioma. This area could be further evaluated with ultrasound as well.   Electronically Signed   By: Lawrence Santiago M.D.   On: 07/12/2013 19:25   Dg Ercp Biliary & Pancreatic Ducts  07/14/2013   CLINICAL DATA:  ERCP with stone removal  EXAM: ERCP  TECHNIQUE: Multiple spot images obtained with the fluoroscopic device and submitted for interpretation post-procedure.  COMPARISON:  Intraoperative cholangiogram- 07/13/2013; CT abdomen pelvis -07/12/2013  FINDINGS: Three spot intraoperative radiographic images of the right upper quadrant during ERCP are provided for review.  Initial image demonstrates an ERCP probe overlying the right upper abdominal quadrant. There is selective cannulation and opacification of the common bile duct. Cholecystectomy clips overlie the expected location of the gallbladder fossa. There is minimal opacification of the central aspect of the intrahepatic biliary tree which appears nondilated.  There is a potential nonocclusive filling defect within the distal aspect of the common bile duct which may represent a retained distal gallstone as was suggested on preceding intraoperative cholangiogram during cholecystectomy.  Subsequent images demonstrate and insufflation of a biliary sphincterotomy balloon within the distal common bile duct with subsequent sphincterotomy and passage of contrast into the adjacent descending portion of the duodenum.  There is no definitive opacification of the pancreatic duct or residual residual cystic duct.  Post lower lumbar paraspinal fusion, incompletely evaluated.  IMPRESSION: ERCP with suspected stone removal and biliary sphincterotomy as above.  These images were submitted for radiologic interpretation only. Please see the procedural report for the amount of contrast and the fluoroscopy time utilized.   Electronically Signed   By: Sandi Mariscal M.D.   On: 07/14/2013 15:00    Microbiology: Recent Results (from the past 240 hour(s))  CULTURE, BLOOD (ROUTINE X 2)     Status: None   Collection Time    07/13/13  8:40 AM      Result Value Range Status   Specimen  Description BLOOD LEFT ARM   Final   Special Requests BOTTLES DRAWN AEROBIC AND ANAEROBIC 10 CC   Final   Culture  Setup Time     Final   Value: 07/13/2013 20:11     Performed at [facility]   Culture     Final   Value:        BLOOD CULTURE RECEIVED NO GROWTH TO DATE CULTURE WILL BE HELD FOR 5 DAYS BEFORE ISSUING A FINAL NEGATIVE REPORT     Performed at [facility]   Report Status PENDING   Incomplete  CULTURE, BLOOD (ROUTINE X 2)     Status: None   Collection Time    07/13/13  8:44 AM      Result Value Range Status   Specimen Description BLOOD RIGHT HAND   Final   Special Requests BOTTLES DRAWN AEROBIC AND  ANAEROBIC 10 CC   Final   Culture  Setup Time     Final   Value: 07/13/2013 20:11     Performed at [facility]   Culture     Final   Value:        BLOOD CULTURE RECEIVED NO GROWTH TO DATE CULTURE WILL BE HELD FOR 5 DAYS BEFORE ISSUING A FINAL NEGATIVE REPORT     Performed at [facility]   Report Status PENDING   Incomplete  SURGICAL PCR SCREEN     Status: None   Collection Time    07/13/13 10:33 AM      Result Value Range Status   MRSA, PCR NEGATIVE  NEGATIVE Final   Staphylococcus aureus NEGATIVE  NEGATIVE Final   Comment:            The Xpert SA Assay (FDA     approved for NASAL specimens     in patients over 22 years of age),     is one component of     a comprehensive surveillance     program.  Test performance has     been validated by [employer] for patients greater     than or equal to 41 year old.     It is not intended     to diagnose infection nor to     guide or monitor treatment.     Labs: Basic Metabolic Panel:  Recent Labs Lab 07/12/13 1125 07/12/13 1344 07/12/13 1428 07/13/13 0115 07/14/13 0532 07/15/13 0554  NA 139  --  143 139 139 138  K 2.7*  --  3.7 2.9* 3.5* 3.3*  CL 96  --  102 97 100 101  CO2 25  --   --  27 31 26   GLUCOSE 179*  --  94 161* 135* 139*  BUN 20  --  12 14 14 13   CREATININE 0.98   --  1.30 0.94 1.00 0.82  CALCIUM 9.4  --   --  7.7* 7.7* 7.9*  MG  --  1.5  --   --   --   --    Liver Function Tests:  Recent Labs Lab 07/12/13 1125 07/13/13 0115 07/14/13 0532 07/15/13 0554  AST 13 30 139* 41*  ALT 14 28 217* 134*  ALKPHOS 78 66 118* 106  BILITOT 1.3* 2.4* 5.4* 2.6*  PROT 6.8 5.9* 5.4* 5.7*  ALBUMIN 4.3 3.4* 2.7* 2.8*    Recent Labs Lab 07/12/13 1125 07/15/13 0554  LIPASE 32 20   CBC:  Recent Labs Lab 07/12/13 1125 07/12/13 1428 07/13/13 0115 07/14/13 0532 07/15/13 0554  WBC 17.1*  --  17.5* 10.1 8.7  NEUTROABS 15.0*  --   --  8.5*  --   HGB 15.4 19.4* 13.5 12.0* 12.0*  HCT 42.0 57.0* 37.8* 35.6* 35.0*  MCV 91.1  --  92.9 96.5 94.9  PLT 219  --  184 123* 137*   Cardiac Enzymes:  Recent Labs Lab 07/12/13 2036 07/13/13 0115  TROPONINI <0.30 <0.30   CBG:  Recent Labs Lab 07/13/13 1424 07/15/13 0716  GLUCAP 146* 141*   Signed:  Cortland Crehan  [facility] 07/15/2013, 6:00 PM

## 2013-07-15 NOTE — Progress Notes (Signed)
1 Day Post-Op  Subjective: He feels better, still pretty sore.  Tolerating a regular diet.  Objective: Vital signs in last 24 hours: Temp:  [97.9 F (36.6 C)-98.4 F (36.9 C)] 98.2 F (36.8 C) (01/20 0606) Pulse Rate:  [82-87] 87 (01/20 0606) Resp:  [16-23] 20 (01/20 0606) BP: (121-160)/(62-110) 151/82 mmHg (01/20 0606) SpO2:  [92 %-99 %] 92 % (01/20 0606) Last BM Date: 07/11/13 600 PO;  Regular diet Afebrile, VSS K+ 3.3, LFT's improving WBC is normal Intake/Output from previous day: 01/19 0701 - 01/20 0700 In: 2663.8 [P.O.:600; I.V.:1913.8; IV Piggyback:150] Out: -  Intake/Output this shift:    General appearance: alert, cooperative and no distress GI: soft, sore, few BS, port sites look good.  Lab Results:   Recent Labs  07/14/13 0532 07/15/13 0554  WBC 10.1 8.7  HGB 12.0* 12.0*  HCT 35.6* 35.0*  PLT 123* 137*    BMET  Recent Labs  07/14/13 0532 07/15/13 0554  NA 139 138  K 3.5* 3.3*  CL 100 101  CO2 31 26  GLUCOSE 135* 139*  BUN 14 13  CREATININE 1.00 0.82  CALCIUM 7.7* 7.9*   PT/INR No results found for this basename: LABPROT, INR,  in the last 72 hours   Recent Labs Lab 07/12/13 1125 07/13/13 0115 07/14/13 0532 07/15/13 0554  AST 13 30 139* 41*  ALT 14 28 217* 134*  ALKPHOS 78 66 118* 106  BILITOT 1.3* 2.4* 5.4* 2.6*  PROT 6.8 5.9* 5.4* 5.7*  ALBUMIN 4.3 3.4* 2.7* 2.8*     Lipase     Component Value Date/Time   LIPASE 20 07/15/2013 0554     Studies/Results: Dg Cholangiogram Operative  07/13/2013   CLINICAL DATA:  Intraoperative cholangiogram.  EXAM: INTRAOPERATIVE CHOLANGIOGRAM  TECHNIQUE: Cholangiographic images from the C-arm fluoroscopic device were submitted for interpretation post-operatively. Please see the procedural report for the amount of contrast and the fluoroscopy time utilized.  COMPARISON:  Preoperative CT scan of the abdomen pelvis dated 12 July 2013  FINDINGS: Contrast has filled normal-appearing intrahepatic  ducts as well as the and common hepatic duct and common bile duct. There is a long segment filling defect within the and common hepatic duct and proximal common bile duct. A discrete rounded stone is not demonstrated. However, there is very little contrast entering the duodenum. There is a convex border filling defect in the distal aspect of the common bile duct which may reflect a retained stone.  IMPRESSION: 1. There is a long segment filling defect within the common hepatic duct and proximal common bile duct. This may reflect sludge or blood products. 2. Dilated convex bordered filling defect in the distal common bile duct without allowing little drainage of contrast may reflect a retained common bile duct stone.   Electronically Signed   By: David  Martinique   On: 07/13/2013 13:51   Dg Ercp Biliary & Pancreatic Ducts  07/14/2013   CLINICAL DATA:  ERCP with stone removal  EXAM: ERCP  TECHNIQUE: Multiple spot images obtained with the fluoroscopic device and submitted for interpretation post-procedure.  COMPARISON:  Intraoperative cholangiogram- 07/13/2013; CT abdomen pelvis -07/12/2013  FINDINGS: Three spot intraoperative radiographic images of the right upper quadrant during ERCP are provided for review.  Initial image demonstrates an ERCP probe overlying the right upper abdominal quadrant. There is selective cannulation and opacification of the common bile duct. Cholecystectomy clips overlie the expected location of the gallbladder fossa. There is minimal opacification of the central aspect of the intrahepatic  biliary tree which appears nondilated.  There is a potential nonocclusive filling defect within the distal aspect of the common bile duct which may represent a retained distal gallstone as was suggested on preceding intraoperative cholangiogram during cholecystectomy.  Subsequent images demonstrate and insufflation of a biliary sphincterotomy balloon within the distal common bile duct with subsequent  sphincterotomy and passage of contrast into the adjacent descending portion of the duodenum.  There is no definitive opacification of the pancreatic duct or residual residual cystic duct.  Post lower lumbar paraspinal fusion, incompletely evaluated.  IMPRESSION: ERCP with suspected stone removal and biliary sphincterotomy as above.  These images were submitted for radiologic interpretation only. Please see the procedural report for the amount of contrast and the fluoroscopy time utilized.   Electronically Signed   By: Sandi Mariscal M.D.   On: 07/14/2013 15:00    Medications: . aspirin EC  81 mg Oral Daily  . atorvastatin  10 mg Oral Daily  . metoprolol succinate  25 mg Oral Daily  . pantoprazole (PROTONIX) IV  40 mg Intravenous Q12H  . piperacillin-tazobactam (ZOSYN)  IV  3.375 g Intravenous Q8H    Assessment/Plan Acute infarcted cholecystitis, cholelithiasis, distal common bile duct stone S/p LAPAROSCOPIC CHOLECYSTECTOMY WITH INTRAOPERATIVE CHOLANGIOGRAM1/18/15 Dr Lucia Gaskins S/p ERCP with sphincterotomy/papillotomy and ERCP with removal of calculus/calculi, 07/14/13 Dr. Owens Loffler.  Elevated LFT's CAD followed by Dr. Burt Knack Hypertension GERD Osteoarthritis/prior back surgery 07/2012   Plan:  He can go home from our standpoint when ok with Medicine.  I will put follow up information in the AVS. He can use tylenol, ibuprofen or Percocet for pain as needed.  LOS: 3 days    Jeff Lopez 07/15/2013

## 2013-07-15 NOTE — Discharge Instructions (Addendum)
Endoscopic Retrograde Cholangiopancreatography (ERCP) Endoscopic retrograde cholangiopancreatography (ERCP) is a procedure used to diagnosis many diseases of the pancreas, bile ducts, liver, and gallbladder. During ERCP a thin, lighted tube (endoscope) is passed through the mouth and down the back of the throat into the first part of the small intestine (duodenum). A small, plastic tube (cannula) is then passed through the endoscope and directed into the bile duct or pancreatic duct. Dye is then injected through the cannula and X-rays are taken to study the biliary and pancreatic passageways.  LET [facility] CARE PROVIDER KNOW ABOUT:   Any allergies you have.   All medicines you are taking, including vitamins, herbs, eyedrops, creams, and over-the-counter medicines.   Previous problems you or members of your family have had with the use of anesthetics.   Any blood disorders you have.   Previous surgeries you have had.   Medical conditions you have. RISKS AND COMPLICATIONS Generally, ERCP is a safe procedure. However, as with any procedure, complications can occur. A simple removal of gallstones has the lowest rate of complications. Higher rates of complication occur in people who have poorly functioning bile or pancreatic ducts. Possible complications include:   Pancreatitis.  Bleeding.  Accidental punctures in the bowel wall, pancreas, or gall bladder.  Gall bladder or bile duct infection. BEFORE THE PROCEDURE   Do not eat or drink anything, including water, for at least 8 hours before the procedure or as directed by your health care provider.   Ask your health care provider whether you should stop taking certain medicines prior to your procedure.   Arrange for someone to drive you home. You will not be allowed to drive for 12 24 hours after the procedure. PROCEDURE   You will be given medicine through a vein (intravenously) to make you relaxed and sleepy.   You might  have a breathing tube placed to give you medicine that makes you sleep (general anesthetic).   Your throat may be sprayed with medicine that numbs the area and prevents gagging (local anesthetic), or you may gargle this medicine.   You will lie on your left side.   The endoscope will be inserted through your mouth and into the duodenum. The tube will not interfere with your breathing. Gagging is prevented by the anesthesia.   While X-rays are being taken, you may be positioned on your stomach.   A small sample of tissue (biopsy) may be removed for examination. AFTER THE PROCEDURE   You will rest in bed until you are fully conscious.   When you first wake up, your throat may feel slightly sore.   You will not be allowed to eat or drink until numbness subsides.   Once you are able to drink, urinate, and sit on the edge of the bed without feeling sick to your stomach (nauseous) or dizzy, you may be allowed to go home. Document Released: 03/07/2001 Document Revised: 04/02/2013 Document Reviewed: 01/21/2013 [facility] Patient Information 2014 [place], [place]. Laparoscopic Cholecystectomy Laparoscopic cholecystectomy is surgery to remove the gallbladder. The gallbladder is located in the upper right part of the abdomen, behind the liver. It is a storage sac for bile produced in the liver. Bile aids in the digestion and absorption of fats. Cholecystectomy is often done for inflammation of the gallbladder (cholecystitis). This condition is usually caused by a buildup of gallstones (cholelithiasis) in your gallbladder. Gallstones can block the flow of bile, resulting in inflammation and pain. In severe cases, emergency surgery may be required.  When emergency surgery is not required, you will have time to prepare for the procedure. Laparoscopic surgery is an alternative to open surgery. Laparoscopic surgery has a shorter recovery time. Your common bile duct may also need to be examined during the  procedure. If stones are found in the common bile duct, they may be removed. LET [facility] CARE PROVIDER KNOW ABOUT:  Any allergies you have.  All medicines you are taking, including vitamins, herbs, eye drops, creams, and over-the-counter medicines.  Previous problems you or members of your family have had with the use of anesthetics.  Any blood disorders you have.  Previous surgeries you have had.  Medical conditions you have. RISKS AND COMPLICATIONS Generally, this is a safe procedure. However, as with any procedure, complications can occur. Possible complications include:  Infection.  Damage to the common bile duct, nerves, arteries, veins, or other internal organs such as the stomach, liver, or intestines.  Bleeding.  A stone may remain in the common bile duct.  A bile leak from the cyst duct that is clipped when your gallbladder is removed.  The need to convert to open surgery, which requires a larger incision in the abdomen. This may be necessary if your surgeon thinks it is not safe to continue with a laparoscopic procedure. BEFORE THE PROCEDURE  Ask your health care provider about changing or stopping any regular medicines. You will need to stop taking aspirin or blood thinners at least 5 days prior to surgery.  Do not eat or drink anything after midnight the night before surgery.  Let your health care provider know if you develop a cold or other infectious problem before surgery. PROCEDURE   You will be given medicine to make you sleep through the procedure (general anesthetic). A breathing tube will be placed in your mouth.  When you are asleep, your surgeon will make several small cuts (incisions) in your abdomen.  A thin, lighted tube with a tiny camera on the end (laparoscope) is inserted through one of the small incisions. The camera on the laparoscope sends a picture to a TV screen in the operating room. This gives the surgeon a good view inside your  abdomen.  A gas will be pumped into your abdomen. This expands your abdomen so that the surgeon has more room to perform the surgery.  Other tools needed for the procedure are inserted through the other incisions. The gallbladder is removed through one of the incisions.  After the removal of your gallbladder, the incisions will be closed with stitches, staples, or skin glue. AFTER THE PROCEDURE  You will be taken to a recovery area where your progress will be checked often.  You may be allowed to go home the same day if your pain is controlled and you can tolerate liquids. Document Released: 06/12/2005 Document Revised: 04/02/2013 Document Reviewed: 01/22/2013 [facility] Patient Information 2014 [place]. [facility] LAPAROSCOPIC SURGERY: POST OP INSTRUCTIONS Always review your discharge instruction sheet given to you by the facility where your surgery was performed. IF YOU HAVE DISABILITY OR FAMILY LEAVE FORMS, YOU MUST BRING THEM TO THE OFFICE FOR PROCESSING.   DO NOT GIVE THEM TO YOUR DOCTOR.  1. A prescription for pain medication may be given to you upon discharge.  Take your pain medication as prescribed, if needed.  If narcotic pain medicine is not needed, then you may take acetaminophen (Tylenol) or ibuprofen (Advil) as needed. 2. Take your usually prescribed medications unless otherwise directed. 3.  If you need a refill on your pain medication, please contact your pharmacy.  They will contact our office to request authorization. Prescriptions will not be filled after 5pm or on week-ends. 4. You should follow a light diet the first few days after arrival home, such as soup and crackers, etc.  Be sure to include lots of fluids daily. 5. Most patients will experience some swelling and bruising in the area of the incisions.  Ice packs will help.  Swelling and bruising can take several days to resolve.  6. It is common to experience some constipation if  taking pain medication after surgery.  Increasing fluid intake and taking a stool softener (such as Colace) will usually help or prevent this problem from occurring.  A mild laxative (Milk of Magnesia or Miralax) should be taken according to package instructions if there are no bowel movements after 48 hours. 7. Unless discharge instructions indicate otherwise, you may remove your bandages 24-48 hours after surgery, and you may shower at that time.  You may have steri-strips (small skin tapes) in place directly over the incision.  These strips should be left on the skin for 7-10 days.  If your surgeon used skin glue on the incision, you may shower in 24 hours.  The glue will flake off over the next 2-3 weeks.  Any sutures or staples will be removed at the office during your follow-up visit. 8. ACTIVITIES:  You may resume regular (light) daily activities beginning the next day--such as daily self-care, walking, climbing stairs--gradually increasing activities as tolerated.  You may have sexual intercourse when it is comfortable.  Refrain from any heavy lifting or straining until approved by your doctor. a. You may drive when you are no longer taking prescription pain medication, you can comfortably wear a seatbelt, and you can safely maneuver your car and apply brakes. b. RETURN TO WORK:  __________________________________________________________ 9. You should see your doctor in the office for a follow-up appointment approximately 2-3 weeks after your surgery.  Make sure that you call for this appointment within a day or two after you arrive home to insure a convenient appointment time. 10. OTHER INSTRUCTIONS: __________________________________________________________________________________________________________________________ __________________________________________________________________________________________________________________________ WHEN TO CALL YOUR DOCTOR: 1. Fever over  101.0 2. Inability to urinate 3. Continued bleeding from incision. 4. Increased pain, redness, or drainage from the incision. 5. Increasing abdominal pain  The clinic staff is available to answer your questions during regular business hours.  Please dont hesitate to call and ask to speak to one of the nurses for clinical concerns.  If you have a medical emergency, go to the nearest emergency room or call 911.  A surgeon from [facility] is always on call at the hospital. [address] [place], [place], [place]  [address] ? [address], [place], [place]   [address] [phone] ? [phone] ? FAX [phone] Web site: [email or web address]   You were cared for by a hospitalist during your hospital stay. If you have any questions about your discharge medications or the care you received while you were in the hospital after you are discharged, you can call the unit and asked to speak with the hospitalist on call if the hospitalist that took care of you is not available. Once you are discharged, your primary care physician will handle any further medical issues. Please note that NO REFILLS for any discharge medications will be authorized once you are discharged, as it is imperative that you return to your primary care  physician (or establish a relationship with a primary care physician if you do not have one) for your aftercare needs so that they can reassess your need for medications and monitor your lab values.     If you do not have a primary care physician, you can call [phone] for a physician referral.  Follow with Primary MD Renato Shin, MD in 5-7 days. Have your potassium level checked at that time.   Get CBC, CMP checked by your doctor and again as further instructed.  Get a 2 view Chest X ray done next visit if you had Pneumonia of Lung problems at the [place] reviewed and adjusted.  Please request your Prim.MD to go over all  Hospital Tests and Procedure/Radiological results at the follow up, please get all Hospital records sent to your Prim MD by signing hospital release before you go home.  Activity: As tolerated with Full fall precautions use walker/cane & assistance as needed  Diet: as tolerated  For Heart failure patients - Check your Weight same time everyday, if you gain over 2 pounds, or you develop in leg swelling, experience more shortness of breath or chest pain, call your Primary MD immediately. Follow Cardiac Low Salt Diet and 1.8 lit/day fluid restriction.  Disposition Home  If you experience worsening of your admission symptoms, develop shortness of breath, life threatening emergency, suicidal or homicidal thoughts you must seek medical attention immediately by calling 911 or calling your MD immediately  if symptoms less severe.  You Must read complete instructions/literature along with all the possible adverse reactions/side effects for all the Medicines you take and that have been prescribed to you. Take any new Medicines after you have completely understood and accpet all the possible adverse reactions/side effects.   Do not drive and provide baby sitting services if your were admitted for syncope or siezures until you have seen by Primary MD or a Neurologist and advised to do so again.  Do not drive when taking Pain medications.   Do not take more than prescribed Pain, Sleep and Anxiety Medications  Special Instructions: If you have smoked or chewed Tobacco  in the last 2 yrs please stop smoking, stop any regular Alcohol  and or any Recreational drug use.  Wear Seat belts while driving.

## 2013-07-19 LAB — CULTURE, BLOOD (ROUTINE X 2)
Culture: NO GROWTH
Culture: NO GROWTH

## 2013-07-21 ENCOUNTER — Other Ambulatory Visit: Payer: Self-pay | Admitting: Cardiovascular Disease

## 2013-07-22 ENCOUNTER — Ambulatory Visit: Payer: 59 | Admitting: Endocrinology

## 2013-07-31 ENCOUNTER — Ambulatory Visit (INDEPENDENT_AMBULATORY_CARE_PROVIDER_SITE_OTHER): Payer: BC Managed Care – PPO | Admitting: Surgery

## 2013-07-31 ENCOUNTER — Encounter (INDEPENDENT_AMBULATORY_CARE_PROVIDER_SITE_OTHER): Payer: Self-pay | Admitting: Surgery

## 2013-07-31 ENCOUNTER — Encounter (INDEPENDENT_AMBULATORY_CARE_PROVIDER_SITE_OTHER): Payer: Self-pay

## 2013-07-31 VITALS — BP 126/74 | HR 72 | Temp 98.0°F | Resp 18 | Ht 72.0 in | Wt 170.0 lb

## 2013-07-31 DIAGNOSIS — K8000 Calculus of gallbladder with acute cholecystitis without obstruction: Secondary | ICD-10-CM

## 2013-07-31 DIAGNOSIS — K8050 Calculus of bile duct without cholangitis or cholecystitis without obstruction: Secondary | ICD-10-CM

## 2013-07-31 NOTE — Progress Notes (Signed)
[place], MD,  [address]  [place], [place]    [place] Phone:  [phone] FAX:  [phone]   Re:   Jeff Lopez DOB:   [birth year] MRN:   [id number]  ASSESSMENT AND PLAN: 1.  Lap chole for gangrenous cholecystitis - 07/14/2013 - D. [employer]  Doing well.  Some RUQ soreness, but getting better.  Return appointment PRN.  2.  ERCP for CBD stone - 07/16/2103 - Dr. Ardis Hughs 2. CAD   Followed by Dr. Burt Knack  3. HTN  4. GERD  5. Osteoarthritis   Back surgery by Dr. Louanne Skye - Feb 2014  HISTORY OF PRESENT ILLNESS: Chief Complaint  Patient presents with  . Routine Post Op    lap chole   Jeff Lopez is a 63 y.o. (DOB: 03-18-1951)  white  male who is a patient of ELLISON, SEAN, MD and comes to me today for follow up of lap chole. Comes by self. He has done well.  He still has some soreness in his RUQ, but this is getting better.  He has some loose stools, but this is also improving. He is retired, so no work to go to.  Past Medical History  Diagnosis Date  . HYPERLIPIDEMIA 04/09/2007  . HYPERTENSION, BENIGN 04/19/2010  . GERD 04/09/2007  . RENAL CYST 05/27/2010  . OSTEOARTHRITIS, LUMBAR SPINE 04/09/2007  . LUMBAR DISC DISORDER 05/27/2010  . CAD, NATIVE VESSEL 04/19/2010    nonobstructive by cath 2007:  oLAD 20-30%, mLAD 50%, pCFX 20-30%, oAVCFX 20-30%, L renal art 50%;  normal LVF  . DDUKGURK(270.6)     SOCIAL HISTORY: Retired.  PHYSICAL EXAM: BP 126/74  Pulse 72  Temp(Src) 98 F (36.7 C)  Resp 18  Ht 6' (1.829 m)  Wt 170 lb (77.111 kg)  BMI 23.05 kg/m2  Abdomen:  Wounds looks good.  Abdomen soft.  BS present.  DATA REVIEWED: Path to patient  Alphonsa Overall, MD,  [facility], [address]  [place], [place]    [place] Phone:  [phone] FAX:  [phone]

## 2013-08-10 ENCOUNTER — Other Ambulatory Visit: Payer: Self-pay | Admitting: Gastroenterology

## 2013-10-13 ENCOUNTER — Other Ambulatory Visit (HOSPITAL_COMMUNITY): Payer: Self-pay | Admitting: Specialist

## 2013-11-03 ENCOUNTER — Encounter (HOSPITAL_COMMUNITY): Payer: Self-pay | Admitting: Pharmacy Technician

## 2013-11-05 ENCOUNTER — Encounter (HOSPITAL_COMMUNITY)
Admission: RE | Admit: 2013-11-05 | Discharge: 2013-11-05 | Disposition: A | Discharge status: Home / Self Care | Payer: BC Managed Care – PPO | Source: Ambulatory Visit | Attending: Specialist | Admitting: Specialist

## 2013-11-05 ENCOUNTER — Encounter (HOSPITAL_COMMUNITY): Payer: Self-pay

## 2013-11-05 DIAGNOSIS — Z01812 Encounter for preprocedural laboratory examination: Secondary | ICD-10-CM | POA: Insufficient documentation

## 2013-11-05 LAB — CBC
HCT: 43.9 % (ref 39.0–52.0)
Hemoglobin: 15.1 g/dL (ref 13.0–17.0)
MCH: 32.5 pg (ref 26.0–34.0)
MCHC: 34.4 g/dL (ref 30.0–36.0)
MCV: 94.4 fL (ref 78.0–100.0)
PLATELETS: 187 10*3/uL (ref 150–400)
RBC: 4.65 MIL/uL (ref 4.22–5.81)
RDW: 13.6 % (ref 11.5–15.5)
WBC: 5.6 10*3/uL (ref 4.0–10.5)

## 2013-11-05 LAB — COMPREHENSIVE METABOLIC PANEL
ALT: 16 U/L (ref 0–53)
AST: 15 U/L (ref 0–37)
Albumin: 3.9 g/dL (ref 3.5–5.2)
Alkaline Phosphatase: 73 U/L (ref 39–117)
BILIRUBIN TOTAL: 1.2 mg/dL (ref 0.3–1.2)
BUN: 17 mg/dL (ref 6–23)
CALCIUM: 8.8 mg/dL (ref 8.4–10.5)
CO2: 23 mEq/L (ref 19–32)
Chloride: 105 mEq/L (ref 96–112)
Creatinine, Ser: 1.03 mg/dL (ref 0.50–1.35)
GFR calc Af Amer: 89 mL/min — ABNORMAL LOW (ref >90)
GFR calc non Af Amer: 76 mL/min — ABNORMAL LOW (ref >90)
Glucose, Bld: 166 mg/dL — ABNORMAL HIGH (ref 70–99)
Potassium: 3.2 mEq/L — ABNORMAL LOW (ref 3.7–5.3)
SODIUM: 144 meq/L (ref 137–147)
Total Protein: 6.6 g/dL (ref 6.0–8.3)

## 2013-11-05 LAB — SURGICAL PCR SCREEN
MRSA, PCR: NEGATIVE
Staphylococcus aureus: NEGATIVE

## 2013-11-05 NOTE — Pre-Procedure Instructions (Signed)
Jeff Lopez  11/05/2013   Your procedure is scheduled on:  Monday, May 18 at 0730 AM  Report to [facility] Admitting at 0530 AM.  Call this number if you have problems the morning of surgery: [phone]   Remember:   Do not eat food or drink liquids after midnight.Sunday night   Take these medicines the morning of surgery with A SIP OF WATER: Metoprolol, Protonix   Do not wear jewelry   Do not wear lotions, powders, or perfumes. Do not wear deodorant.  Do not shave 48 hours prior to surgery. Men may shave face and neck.  Do not bring valuables to the hospital.  [place] is not responsible  for any belongings or valuables.               Contacts, dentures or bridgework may not be worn into surgery.  Leave suitcase in the car. After surgery it may be brought to your room.  For patients admitted to the hospital, discharge time is determined by your    treatment team.               Patients discharged the day of surgery will not be allowed to drive home.  Name and phone number of your driver:     Special Instructions: [place] - Preparing for Surgery  Before surgery, you can play an important role.  Because skin is not sterile, your skin needs to be as free of germs as possible.  You can reduce the number of germs on you skin by washing with CHG (chlorahexidine gluconate) soap before surgery.  CHG is an antiseptic cleaner which kills germs and bonds with the skin to continue killing germs even after washing.  Please DO NOT use if you have an allergy to CHG or antibacterial soaps.  If your skin becomes reddened/irritated stop using the CHG and inform your nurse when you arrive at Short Stay.  Do not shave (including legs and underarms) for at least 48 hours prior to the first CHG shower.  You may shave your face.  Please follow these instructions carefully:   1.  Shower with CHG Soap the night before surgery and the   morning of Surgery.  2.  If you choose to  wash your hair, wash your hair first as usual with your   normal shampoo.  3.  After you shampoo, rinse your hair and body thoroughly to remove the  Shampoo.  4.  Use CHG as you would any other liquid soap.  You can apply chg directly   to the skin and wash gently with scrungie or a clean washcloth.  5.  Apply the CHG Soap to your body ONLY FROM THE NECK DOWN.    Do not use on open wounds or open sores.  Avoid contact with your eyes,   ears, mouth and genitals (private parts).  Wash genitals (private parts)   with your normal soap.  6.  Wash thoroughly, paying special attention to the area where your surgery   will be performed.  7.  Thoroughly rinse your body with warm water from the neck down.  8.  DO NOT shower/wash with your normal soap after using and rinsing off   the CHG Soap.  9.  Pat yourself dry with a clean towel.            10.  Wear clean pajamas.            11 .  Place clean sheets on your bed the night of your first shower and do not    sleep with pets.  Day of Surgery  Do not apply any lotions/deoderants the morning of surgery.  Please wear clean clothes to the hospital/surgery center.     Please read over the following fact sheets that you were given: Pain Booklet, Coughing and Deep Breathing and Surgical Site Infection Prevention

## 2013-11-07 NOTE — H&P (Signed)
Jeff Lopez is an 63 y.o. male.   Chief Complaint:low back pain and left groin and thigh pain HPI:   He underwent recent MRI scan of his lumbar spine showing the rather significant disk protrusion with what appears to be extruded disk material into the left neural foramen at L1-2 and this occludes most of the foramen within its lateral half and also extraforaminal and would affect the left L1 nerve root.  His pain has been into the left groin and left thigh and would be consistent with upper lumbar radiculopathy on the left.       He has had a prolonged period of discomfort extending all the way back to the beginning of this year and, unfortunately, has had problems with cholecystitis that limited his treatments.  Overall his situation is one where he has already had a prolonged period of time to see if he would have regression of the disk protrusion and improvement in his function on the left side.  The protrusion would affect the left L1 nerve root, which can cause groin pain on the left side and thigh discomfort.  He is taking gabapentin. He has used a prednisone dose pack.  He is now taking OxyContin 20 mg tablet every 12 hours, which is unusual and usually not necessary to this degree after a fusion as he has had from L2 to sacrum.  Overall  [name]'s situation is one where conservative management  has gotten him to where he is able to stand and ambulate but he cannot walk any distance.  He is only able to walk 100-150 feet.  He is pushing to gain upright.  He has pain when he tries to extend his back.  He is limited in terms of his standing and walking tolerance by likely foraminal/extraforaminal protrusion, left L1-2.    PLAN:  undergo left-sided extraforaminal approach to excision of HNP, L1-2.  The risks of surgery, including risks of infection, bleeding, neurologic compromise.  As the disk is extraforaminal, would be able to avoid laminotomy being within the spinal canal and I think we may be  able to avoid extending his fusion upward to incorporate this segment.    Past Medical History  Diagnosis Date  . HYPERLIPIDEMIA 04/09/2007  . HYPERTENSION, BENIGN 04/19/2010  . GERD 04/09/2007  . RENAL CYST 05/27/2010  . OSTEOARTHRITIS, LUMBAR SPINE 04/09/2007  . LUMBAR DISC DISORDER 05/27/2010  . CAD, NATIVE VESSEL 04/19/2010    nonobstructive by cath 2007:  oLAD 20-30%, mLAD 50%, pCFX 20-30%, oAVCFX 20-30%, L renal art 50%;  normal LVF  . HWTUUEKC(003.4)     Past Surgical History  Procedure Laterality Date  . Spine surgery  2006    C-spine surgery x 2  . Neck surgery    . Lumbar disc surgery  08/06/2012    L3 & L4  . Foot fracture surgery    . Lumbar laminectomy/decompression microdiscectomy Left 08/10/2012    Procedure: Dura Repair Left Side L2-L3;  Surgeon: [name], MD;  Location: [facility];  Service: Orthopedics;  Laterality: Left;  Wilson Frame, Sliding table, dura repair kit, microscope  . Cholecystectomy    . Cholecystectomy N/A 07/13/2013    Procedure: LAPAROSCOPIC CHOLECYSTECTOMY WITH INTRAOPERATIVE CHOLANGIOGRAM;  Surgeon: Shann Medal, MD;  Location: [facility];  Service: General;  Laterality: N/A;  . Ercp N/A 07/14/2013    Procedure: ENDOSCOPIC RETROGRADE CHOLANGIOPANCREATOGRAPHY (ERCP);  Surgeon: [name], MD;  Location: [facility];  Service: Endoscopy;  Laterality: N/A;  .  Cardiac catheterization  2007    Dr Loanne Drilling  . Fracture surgery    . Back surgery  2012,2014    Family History  Problem Relation Age of Onset  . Cancer Mother     Breast Cancer, Uterine Cancer  . Breast cancer Mother   . Uterine cancer Mother   . Hypertension Brother   . Heart disease Mother    Social History:  reports that he quit smoking about 19 months ago. His smoking use included Cigarettes. He has a 22 pack-year smoking history. He has never used smokeless tobacco. He reports that he does not drink alcohol or use illicit drugs.  Allergies:  Allergies  Allergen Reactions  .  Ceftin Other (See Comments)    Patient stated it caused "sores in mouth"    Medications Prior to Admission  Medication Sig Dispense Refill  . aspirin EC 81 MG tablet Take 1 tablet (81 mg total) by mouth daily.      [name] losartan-hydrochlorothiazide (HYZAAR) 100-12.5 MG per tablet Take 1 tablet by mouth daily.      . metoprolol succinate (TOPROL-XL) 25 MG 24 hr tablet Take 25 mg by mouth daily.      . pantoprazole (PROTONIX) 40 MG tablet Take 40 mg by mouth 2 (two) times daily. Before meals      . pregabalin (LYRICA) 50 MG capsule Take 50 mg by mouth 3 (three) times daily.        No results found for this or any previous visit (from the past 48 hour(s)). No results found.  Review of Systems  Constitutional: Negative.   HENT: Negative.   Eyes: Negative.   Respiratory: Negative.   Cardiovascular: Negative.   Gastrointestinal: Negative.   Genitourinary: Negative.   Musculoskeletal: Positive for back pain.  Skin: Negative.   Neurological: Positive for focal weakness.       Left hip flexion  Endo/Heme/Allergies: Negative.   Psychiatric/Behavioral: Negative.     Blood pressure 165/97, pulse 61, temperature 97.8 F (36.6 C), temperature source Oral, resp. rate 20, weight 80.684 kg (177 lb 14 oz), SpO2 96.00%. Physical Exam  Constitutional: He is oriented to person, place, and time. He appears well-developed and well-nourished.  HENT:  Head: Normocephalic and atraumatic.  Eyes: EOM are normal. Pupils are equal, round, and reactive to light.  Neck: Normal range of motion.  Cardiovascular: Normal rate, regular rhythm and normal heart sounds.   Respiratory: Effort normal and breath sounds normal.  GI: Soft.  Musculoskeletal:   slight hamstring tightness on the left, but no significant motor deficit in terms of dorsiflexion or plantar flexion strength, knee extension and flexion strength, hip abduction and adduction, or hip flexion testing.  His reflex in the left knee is diminished and  this likely results from previous intervention on the left side, both at L4 and L3 nerve roots  Neurological: He is alert and oriented to person, place, and time.  Skin: Skin is warm and dry.  Psychiatric: He has a normal mood and affect.     Assessment/Plan HNP left L1-2 extraforamiinal  PLAN:  extraforaminal approach to excision of HNP left L1-2  [name] 11/10/2013, 7:23 AM

## 2013-11-09 MED ORDER — VANCOMYCIN HCL IN DEXTROSE 1-5 GM/200ML-% IV SOLN
1000.0000 mg | INTRAVENOUS | Status: DC
  Filled 2013-11-09: qty 200

## 2013-11-09 MED ORDER — CHLORHEXIDINE GLUCONATE 4 % EX LIQD
60.0000 mL | Freq: Once | CUTANEOUS | Status: DC
  Filled 2013-11-09: qty 60

## 2013-11-10 ENCOUNTER — Ambulatory Visit (HOSPITAL_COMMUNITY): Payer: BC Managed Care – PPO | Admitting: Anesthesiology

## 2013-11-10 ENCOUNTER — Ambulatory Visit (HOSPITAL_COMMUNITY): Payer: BC Managed Care – PPO

## 2013-11-10 ENCOUNTER — Encounter (HOSPITAL_COMMUNITY)
Admission: RE | Disposition: A | Discharge status: Home / Self Care | Payer: Self-pay | Source: Ambulatory Visit | Attending: Specialist

## 2013-11-10 ENCOUNTER — Encounter (HOSPITAL_COMMUNITY): Payer: BC Managed Care – PPO | Admitting: Anesthesiology

## 2013-11-10 ENCOUNTER — Encounter (HOSPITAL_COMMUNITY): Payer: Self-pay | Admitting: *Deleted

## 2013-11-10 ENCOUNTER — Observation Stay (HOSPITAL_COMMUNITY)
Admission: RE | Admit: 2013-11-10 | Discharge: 2013-11-11 | Disposition: A | Discharge status: Home / Self Care | Payer: BC Managed Care – PPO | Source: Ambulatory Visit | Attending: Specialist | Admitting: Specialist

## 2013-11-10 DIAGNOSIS — Z87891 Personal history of nicotine dependence: Secondary | ICD-10-CM | POA: Insufficient documentation

## 2013-11-10 DIAGNOSIS — Z7982 Long term (current) use of aspirin: Secondary | ICD-10-CM | POA: Insufficient documentation

## 2013-11-10 DIAGNOSIS — M5126 Other intervertebral disc displacement, lumbar region: Principal | ICD-10-CM | POA: Insufficient documentation

## 2013-11-10 DIAGNOSIS — J449 Chronic obstructive pulmonary disease, unspecified: Secondary | ICD-10-CM | POA: Insufficient documentation

## 2013-11-10 DIAGNOSIS — M47817 Spondylosis without myelopathy or radiculopathy, lumbosacral region: Secondary | ICD-10-CM | POA: Insufficient documentation

## 2013-11-10 DIAGNOSIS — J4489 Other specified chronic obstructive pulmonary disease: Secondary | ICD-10-CM | POA: Insufficient documentation

## 2013-11-10 DIAGNOSIS — I2510 Atherosclerotic heart disease of native coronary artery without angina pectoris: Secondary | ICD-10-CM | POA: Insufficient documentation

## 2013-11-10 DIAGNOSIS — E785 Hyperlipidemia, unspecified: Secondary | ICD-10-CM | POA: Insufficient documentation

## 2013-11-10 DIAGNOSIS — I10 Essential (primary) hypertension: Secondary | ICD-10-CM | POA: Insufficient documentation

## 2013-11-10 DIAGNOSIS — M5136 Other intervertebral disc degeneration, lumbar region: Secondary | ICD-10-CM | POA: Diagnosis present

## 2013-11-10 DIAGNOSIS — R51 Headache: Secondary | ICD-10-CM | POA: Insufficient documentation

## 2013-11-10 DIAGNOSIS — K219 Gastro-esophageal reflux disease without esophagitis: Secondary | ICD-10-CM | POA: Insufficient documentation

## 2013-11-10 HISTORY — PX: LUMBAR LAMINECTOMY: SHX95

## 2013-11-10 SURGERY — MICRODISCECTOMY LUMBAR LAMINECTOMY
Anesthesia: General | Site: Back

## 2013-11-10 MED ORDER — ARTIFICIAL TEARS OP OINT
TOPICAL_OINTMENT | OPHTHALMIC | Status: AC
  Filled 2013-11-10: qty 3.5

## 2013-11-10 MED ORDER — METHOCARBAMOL 500 MG PO TABS
ORAL_TABLET | ORAL | Status: AC
  Filled 2013-11-10: qty 1

## 2013-11-10 MED ORDER — MORPHINE SULFATE 2 MG/ML IJ SOLN: 1.0000 mg | INTRAMUSCULAR | Status: DC | PRN

## 2013-11-10 MED ORDER — FLEET ENEMA 7-19 GM/118ML RE ENEM: 1.0000 | ENEMA | Freq: Once | RECTAL | Status: AC | PRN

## 2013-11-10 MED ORDER — ASPIRIN EC 81 MG PO TBEC
81.0000 mg | DELAYED_RELEASE_TABLET | Freq: Every day | ORAL | Status: DC
  Administered 2013-11-10 – 2013-11-11 (×2): 81 mg via ORAL
  Filled 2013-11-10 (×2): qty 1

## 2013-11-10 MED ORDER — NEOSTIGMINE METHYLSULFATE 10 MG/10ML IV SOLN
INTRAVENOUS | Status: DC | PRN
  Administered 2013-11-10: 3 mg via INTRAVENOUS

## 2013-11-10 MED ORDER — ONDANSETRON HCL 4 MG/2ML IJ SOLN
4.0000 mg | Freq: Four times a day (QID) | INTRAMUSCULAR | Status: AC | PRN
  Administered 2013-11-10: 4 mg via INTRAVENOUS

## 2013-11-10 MED ORDER — HYDROCHLOROTHIAZIDE 12.5 MG PO CAPS
12.5000 mg | ORAL_CAPSULE | Freq: Every day | ORAL | Status: DC
  Administered 2013-11-10 – 2013-11-11 (×2): 12.5 mg via ORAL
  Filled 2013-11-10 (×2): qty 1

## 2013-11-10 MED ORDER — MENTHOL 3 MG MT LOZG: 1.0000 | LOZENGE | OROMUCOSAL | Status: DC | PRN

## 2013-11-10 MED ORDER — BUPIVACAINE LIPOSOME 1.3 % IJ SUSP
INTRAMUSCULAR | Status: DC | PRN
  Administered 2013-11-10: 10 mL

## 2013-11-10 MED ORDER — THROMBIN 20000 UNITS EX SOLR
CUTANEOUS | Status: AC
  Filled 2013-11-10: qty 20000

## 2013-11-10 MED ORDER — KCL IN DEXTROSE-NACL 20-5-0.45 MEQ/L-%-% IV SOLN
INTRAVENOUS | Status: DC
  Administered 2013-11-10 – 2013-11-11 (×2): via INTRAVENOUS
  Filled 2013-11-10 (×4): qty 1000

## 2013-11-10 MED ORDER — LIDOCAINE HCL (CARDIAC) 20 MG/ML IV SOLN
INTRAVENOUS | Status: AC
  Filled 2013-11-10: qty 5

## 2013-11-10 MED ORDER — ONDANSETRON HCL 4 MG/2ML IJ SOLN: 4.0000 mg | INTRAMUSCULAR | Status: DC | PRN

## 2013-11-10 MED ORDER — SODIUM CHLORIDE 0.9 % IJ SOLN: 3.0000 mL | INTRAMUSCULAR | Status: DC | PRN

## 2013-11-10 MED ORDER — ARTIFICIAL TEARS OP OINT
TOPICAL_OINTMENT | OPHTHALMIC | Status: DC | PRN
  Administered 2013-11-10: 1 via OPHTHALMIC

## 2013-11-10 MED ORDER — PREGABALIN 50 MG PO CAPS
50.0000 mg | ORAL_CAPSULE | Freq: Three times a day (TID) | ORAL | Status: DC
  Administered 2013-11-10 – 2013-11-11 (×3): 50 mg via ORAL
  Filled 2013-11-10 (×3): qty 1

## 2013-11-10 MED ORDER — HYDROMORPHONE HCL PF 1 MG/ML IJ SOLN
INTRAMUSCULAR | Status: AC
  Filled 2013-11-10: qty 1

## 2013-11-10 MED ORDER — SODIUM CHLORIDE 0.9 % IV SOLN: 250.0000 mL | INTRAVENOUS | Status: DC

## 2013-11-10 MED ORDER — GLYCOPYRROLATE 0.2 MG/ML IJ SOLN
INTRAMUSCULAR | Status: AC
  Filled 2013-11-10: qty 2

## 2013-11-10 MED ORDER — MIDAZOLAM HCL 2 MG/2ML IJ SOLN
INTRAMUSCULAR | Status: AC
  Filled 2013-11-10: qty 2

## 2013-11-10 MED ORDER — PROPOFOL 10 MG/ML IV BOLUS
INTRAVENOUS | Status: AC
  Filled 2013-11-10: qty 20

## 2013-11-10 MED ORDER — PHENYLEPHRINE 40 MCG/ML (10ML) SYRINGE FOR IV PUSH (FOR BLOOD PRESSURE SUPPORT)
PREFILLED_SYRINGE | INTRAVENOUS | Status: AC
  Filled 2013-11-10: qty 10

## 2013-11-10 MED ORDER — OXYCODONE-ACETAMINOPHEN 5-325 MG PO TABS: 1.0000 | ORAL_TABLET | ORAL | Status: DC | PRN

## 2013-11-10 MED ORDER — KETOROLAC TROMETHAMINE 30 MG/ML IJ SOLN
30.0000 mg | Freq: Four times a day (QID) | INTRAMUSCULAR | Status: AC
  Administered 2013-11-10 – 2013-11-11 (×4): 30 mg via INTRAVENOUS
  Filled 2013-11-10 (×3): qty 1

## 2013-11-10 MED ORDER — EPHEDRINE SULFATE 50 MG/ML IJ SOLN
INTRAMUSCULAR | Status: AC
  Filled 2013-11-10: qty 1

## 2013-11-10 MED ORDER — SODIUM CHLORIDE 0.9 % IV SOLN
INTRAVENOUS | Status: DC | PRN
  Administered 2013-11-10: 07:00:00 via INTRAVENOUS

## 2013-11-10 MED ORDER — BISACODYL 10 MG RE SUPP: 10.0000 mg | Freq: Every day | RECTAL | Status: DC | PRN

## 2013-11-10 MED ORDER — LOSARTAN POTASSIUM-HCTZ 100-12.5 MG PO TABS: 1.0000 | ORAL_TABLET | Freq: Every day | ORAL | Status: DC

## 2013-11-10 MED ORDER — FENTANYL CITRATE 0.05 MG/ML IJ SOLN
INTRAMUSCULAR | Status: AC
  Filled 2013-11-10: qty 5

## 2013-11-10 MED ORDER — ROCURONIUM BROMIDE 100 MG/10ML IV SOLN
INTRAVENOUS | Status: DC | PRN
  Administered 2013-11-10: 50 mg via INTRAVENOUS

## 2013-11-10 MED ORDER — BUPIVACAINE LIPOSOME 1.3 % IJ SUSP
20.0000 mL | Freq: Once | INTRAMUSCULAR | Status: DC
  Filled 2013-11-10 (×2): qty 20

## 2013-11-10 MED ORDER — NEOSTIGMINE METHYLSULFATE 10 MG/10ML IV SOLN
INTRAVENOUS | Status: AC
  Filled 2013-11-10: qty 1

## 2013-11-10 MED ORDER — BUPIVACAINE-EPINEPHRINE (PF) 0.5% -1:200000 IJ SOLN
INTRAMUSCULAR | Status: AC
  Filled 2013-11-10: qty 30

## 2013-11-10 MED ORDER — METHOCARBAMOL 500 MG PO TABS
500.0000 mg | ORAL_TABLET | Freq: Four times a day (QID) | ORAL | Status: DC | PRN
  Administered 2013-11-10 – 2013-11-11 (×4): 500 mg via ORAL
  Filled 2013-11-10 (×3): qty 1

## 2013-11-10 MED ORDER — GLYCOPYRROLATE 0.2 MG/ML IJ SOLN
INTRAMUSCULAR | Status: DC | PRN
Start: 2013-11-10 — End: 2013-11-10
  Administered 2013-11-10: 0.4 mg via INTRAVENOUS

## 2013-11-10 MED ORDER — METHOCARBAMOL 1000 MG/10ML IJ SOLN: 500.0000 mg | Freq: Four times a day (QID) | INTRAVENOUS | Status: DC | PRN

## 2013-11-10 MED ORDER — HYDROMORPHONE HCL PF 1 MG/ML IJ SOLN
0.2500 mg | INTRAMUSCULAR | Status: DC | PRN
  Administered 2013-11-10 (×2): 0.25 mg via INTRAVENOUS
  Administered 2013-11-10: 0.5 mg via INTRAVENOUS

## 2013-11-10 MED ORDER — PROPOFOL 10 MG/ML IV BOLUS
INTRAVENOUS | Status: DC | PRN
  Administered 2013-11-10: 150 mg via INTRAVENOUS

## 2013-11-10 MED ORDER — THROMBIN 20000 UNITS EX SOLR
OROMUCOSAL | Status: DC | PRN
  Administered 2013-11-10: 08:00:00 via TOPICAL

## 2013-11-10 MED ORDER — LOSARTAN POTASSIUM 50 MG PO TABS
100.0000 mg | ORAL_TABLET | Freq: Every day | ORAL | Status: DC
  Administered 2013-11-10 – 2013-11-11 (×2): 100 mg via ORAL
  Filled 2013-11-10 (×2): qty 2

## 2013-11-10 MED ORDER — PHENOL 1.4 % MT LIQD: 1.0000 | OROMUCOSAL | Status: DC | PRN

## 2013-11-10 MED ORDER — BUPIVACAINE HCL (PF) 0.5 % IJ SOLN
INTRAMUSCULAR | Status: DC | PRN
  Administered 2013-11-10: 10 mL

## 2013-11-10 MED ORDER — KETOROLAC TROMETHAMINE 30 MG/ML IJ SOLN
INTRAMUSCULAR | Status: AC
  Filled 2013-11-10: qty 1

## 2013-11-10 MED ORDER — FENTANYL CITRATE 0.05 MG/ML IJ SOLN
INTRAMUSCULAR | Status: DC | PRN
  Administered 2013-11-10: 200 ug via INTRAVENOUS
  Administered 2013-11-10 (×2): 50 ug via INTRAVENOUS

## 2013-11-10 MED ORDER — SENNOSIDES-DOCUSATE SODIUM 8.6-50 MG PO TABS: 1.0000 | ORAL_TABLET | Freq: Every evening | ORAL | Status: DC | PRN

## 2013-11-10 MED ORDER — METOPROLOL SUCCINATE ER 25 MG PO TB24
25.0000 mg | ORAL_TABLET | Freq: Every day | ORAL | Status: DC
  Administered 2013-11-11: 25 mg via ORAL
  Filled 2013-11-10 (×2): qty 1

## 2013-11-10 MED ORDER — ROCURONIUM BROMIDE 50 MG/5ML IV SOLN
INTRAVENOUS | Status: AC
  Filled 2013-11-10: qty 1

## 2013-11-10 MED ORDER — 0.9 % SODIUM CHLORIDE (POUR BTL) OPTIME
TOPICAL | Status: DC | PRN
  Administered 2013-11-10: 1000 mL

## 2013-11-10 MED ORDER — EPHEDRINE SULFATE 50 MG/ML IJ SOLN
INTRAMUSCULAR | Status: DC | PRN
  Administered 2013-11-10 (×4): 5 mg via INTRAVENOUS

## 2013-11-10 MED ORDER — OXYCODONE HCL 5 MG/5ML PO SOLN: 5.0000 mg | Freq: Once | ORAL | Status: DC | PRN

## 2013-11-10 MED ORDER — ACETAMINOPHEN 325 MG PO TABS: 650.0000 mg | ORAL_TABLET | ORAL | Status: DC | PRN

## 2013-11-10 MED ORDER — OXYCODONE-ACETAMINOPHEN 5-325 MG PO TABS
ORAL_TABLET | ORAL | Status: AC
  Filled 2013-11-10: qty 2

## 2013-11-10 MED ORDER — OXYCODONE HCL 5 MG PO TABS: 5.0000 mg | ORAL_TABLET | Freq: Once | ORAL | Status: DC | PRN

## 2013-11-10 MED ORDER — MIDAZOLAM HCL 5 MG/5ML IJ SOLN
INTRAMUSCULAR | Status: DC | PRN
  Administered 2013-11-10: 2 mg via INTRAVENOUS

## 2013-11-10 MED ORDER — ONDANSETRON HCL 4 MG/2ML IJ SOLN
INTRAMUSCULAR | Status: AC
  Filled 2013-11-10: qty 2

## 2013-11-10 MED ORDER — VANCOMYCIN HCL 1000 MG IV SOLR
1000.0000 mg | INTRAVENOUS | Status: DC | PRN
  Administered 2013-11-10: 1000 mg via INTRAVENOUS

## 2013-11-10 MED ORDER — LACTATED RINGERS IV SOLN
INTRAVENOUS | Status: DC | PRN
  Administered 2013-11-10 (×2): via INTRAVENOUS

## 2013-11-10 MED ORDER — OXYCODONE-ACETAMINOPHEN 5-325 MG PO TABS
1.0000 | ORAL_TABLET | ORAL | Status: DC | PRN
  Administered 2013-11-10 – 2013-11-11 (×7): 2 via ORAL
  Filled 2013-11-10 (×6): qty 2

## 2013-11-10 MED ORDER — ACETAMINOPHEN 650 MG RE SUPP: 650.0000 mg | RECTAL | Status: DC | PRN

## 2013-11-10 MED ORDER — DOCUSATE SODIUM 100 MG PO CAPS
100.0000 mg | ORAL_CAPSULE | Freq: Two times a day (BID) | ORAL | Status: DC
  Administered 2013-11-10: 100 mg via ORAL
  Filled 2013-11-10 (×3): qty 1

## 2013-11-10 MED ORDER — LIDOCAINE HCL (CARDIAC) 20 MG/ML IV SOLN
INTRAVENOUS | Status: DC | PRN
  Administered 2013-11-10: 40 mg via INTRAVENOUS

## 2013-11-10 MED ORDER — SUCCINYLCHOLINE CHLORIDE 20 MG/ML IJ SOLN
INTRAMUSCULAR | Status: AC
Start: 2013-11-10 — End: 2013-11-10
  Filled 2013-11-10: qty 1

## 2013-11-10 MED ORDER — SODIUM CHLORIDE 0.9 % IJ SOLN
INTRAMUSCULAR | Status: AC
  Filled 2013-11-10: qty 10

## 2013-11-10 MED ORDER — KCL IN DEXTROSE-NACL 20-5-0.45 MEQ/L-%-% IV SOLN
INTRAVENOUS | Status: AC
  Filled 2013-11-10: qty 1000

## 2013-11-10 MED ORDER — GLYCOPYRROLATE 0.2 MG/ML IJ SOLN
INTRAMUSCULAR | Status: AC
  Filled 2013-11-10: qty 1

## 2013-11-10 MED ORDER — SODIUM CHLORIDE 0.9 % IJ SOLN
3.0000 mL | Freq: Two times a day (BID) | INTRAMUSCULAR | Status: DC
  Administered 2013-11-11: 3 mL via INTRAVENOUS

## 2013-11-10 MED ORDER — METHOCARBAMOL 500 MG PO TABS: 500.0000 mg | ORAL_TABLET | Freq: Four times a day (QID) | ORAL | Status: DC | PRN

## 2013-11-10 MED ORDER — ONDANSETRON HCL 4 MG/2ML IJ SOLN
INTRAMUSCULAR | Status: DC | PRN
  Administered 2013-11-10: 4 mg via INTRAVENOUS

## 2013-11-10 MED ORDER — HYDROCODONE-ACETAMINOPHEN 5-325 MG PO TABS: 1.0000 | ORAL_TABLET | ORAL | Status: DC | PRN

## 2013-11-10 MED ORDER — PANTOPRAZOLE SODIUM 40 MG PO TBEC
40.0000 mg | DELAYED_RELEASE_TABLET | Freq: Two times a day (BID) | ORAL | Status: DC
  Administered 2013-11-10 – 2013-11-11 (×2): 40 mg via ORAL
  Filled 2013-11-10 (×2): qty 1

## 2013-11-10 SURGICAL SUPPLY — 56 items
ADH SKN CLS APL DERMABOND .7 (GAUZE/BANDAGES/DRESSINGS) ×1
BLADE 10 SAFETY STRL DISP (BLADE) ×2 IMPLANT
BUR RND FLUTED 2.5 (BURR) IMPLANT
BUR ROUND FLUTED 4 SOFT TCH (BURR) ×2 IMPLANT
BUR SABER RD CUTTING 3.0 (BURR) IMPLANT
CANISTER SUCTION 2500CC (MISCELLANEOUS) ×2 IMPLANT
CORDS BIPOLAR (ELECTRODE) ×2 IMPLANT
COVER MAYO STAND STRL (DRAPES) ×2 IMPLANT
COVER SURGICAL LIGHT HANDLE (MISCELLANEOUS) ×2 IMPLANT
DERMABOND ADVANCED (GAUZE/BANDAGES/DRESSINGS) ×1
DERMABOND ADVANCED .7 DNX12 (GAUZE/BANDAGES/DRESSINGS) ×1 IMPLANT
DRAPE C-ARM 42X72 X-RAY (DRAPES) ×2 IMPLANT
DRAPE MICROSCOPE LEICA (MISCELLANEOUS) ×2 IMPLANT
DRAPE POUCH INSTRU U-SHP 10X18 (DRAPES) ×2 IMPLANT
DRAPE PROXIMA HALF (DRAPES) IMPLANT
DRAPE SURG 17X23 STRL (DRAPES) ×6 IMPLANT
DRSG MEPILEX BORDER 4X4 (GAUZE/BANDAGES/DRESSINGS) ×1 IMPLANT
DRSG MEPILEX BORDER 4X8 (GAUZE/BANDAGES/DRESSINGS) IMPLANT
DURAPREP 26ML APPLICATOR (WOUND CARE) ×2 IMPLANT
ELECT BLADE 4.0 EZ CLEAN MEGAD (MISCELLANEOUS) ×2
ELECT CAUTERY BLADE 6.4 (BLADE) ×2 IMPLANT
ELECT REM PT RETURN 9FT ADLT (ELECTROSURGICAL) ×2
ELECTRODE BLDE 4.0 EZ CLN MEGD (MISCELLANEOUS) ×1 IMPLANT
ELECTRODE REM PT RTRN 9FT ADLT (ELECTROSURGICAL) ×1 IMPLANT
GLOVE BIOGEL PI IND STRL 7.5 (GLOVE) ×1 IMPLANT
GLOVE BIOGEL PI INDICATOR 7.5 (GLOVE) ×1
GLOVE ECLIPSE 7.0 STRL STRAW (GLOVE) ×2 IMPLANT
GLOVE ECLIPSE 8.5 STRL (GLOVE) ×2 IMPLANT
GLOVE SURG 8.5 LATEX PF (GLOVE) ×2 IMPLANT
GOWN STRL REUS W/ TWL LRG LVL3 (GOWN DISPOSABLE) ×2 IMPLANT
GOWN STRL REUS W/TWL 2XL LVL3 (GOWN DISPOSABLE) ×2 IMPLANT
GOWN STRL REUS W/TWL LRG LVL3 (GOWN DISPOSABLE) ×4
KIT BASIN OR (CUSTOM PROCEDURE TRAY) ×2 IMPLANT
KIT ROOM TURNOVER OR (KITS) ×2 IMPLANT
NDL SPNL 18GX3.5 QUINCKE PK (NEEDLE) ×2 IMPLANT
NEEDLE 22X1 1/2 (OR ONLY) (NEEDLE) ×2 IMPLANT
NEEDLE SPNL 18GX3.5 QUINCKE PK (NEEDLE) ×4 IMPLANT
NS IRRIG 1000ML POUR BTL (IV SOLUTION) ×2 IMPLANT
PACK LAMINECTOMY ORTHO (CUSTOM PROCEDURE TRAY) ×2 IMPLANT
PAD ARMBOARD 7.5X6 YLW CONV (MISCELLANEOUS) ×4 IMPLANT
PATTIES SURGICAL .5 X.5 (GAUZE/BANDAGES/DRESSINGS) IMPLANT
PATTIES SURGICAL .75X.75 (GAUZE/BANDAGES/DRESSINGS) IMPLANT
SPONGE LAP 4X18 X RAY DECT (DISPOSABLE) IMPLANT
SPONGE SURGIFOAM ABS GEL 100 (HEMOSTASIS) IMPLANT
SUT VIC AB 1 CT1 27 (SUTURE)
SUT VIC AB 1 CT1 27XBRD ANBCTR (SUTURE) IMPLANT
SUT VIC AB 2-0 CT1 27 (SUTURE)
SUT VIC AB 2-0 CT1 TAPERPNT 27 (SUTURE) IMPLANT
SUT VICRYL 0 UR6 27IN ABS (SUTURE) IMPLANT
SUT VICRYL 4-0 PS2 18IN ABS (SUTURE) IMPLANT
SYR 20CC LL (SYRINGE) ×2 IMPLANT
SYR CONTROL 10ML LL (SYRINGE) ×2 IMPLANT
TOWEL OR 17X24 6PK STRL BLUE (TOWEL DISPOSABLE) ×2 IMPLANT
TOWEL OR 17X26 10 PK STRL BLUE (TOWEL DISPOSABLE) ×2 IMPLANT
TRAY FOLEY CATH 16FRSI W/METER (SET/KITS/TRAYS/PACK) IMPLANT
WATER STERILE IRR 1000ML POUR (IV SOLUTION) ×2 IMPLANT

## 2013-11-10 NOTE — Interval H&P Note (Signed)
History and Physical Interval Note:  11/10/2013 7:23 AM  Jeff Lopez  has presented today for surgery, with the diagnosis of Left far lateral herniated nucleus pulposus L1-2  The various methods of treatment have been discussed with the patient and family. After consideration of risks, benefits and other options for treatment, the patient has consented to  Procedure(s): Left L1-2 far lateral approach to excise herniated nucleus pulposus (N/A) as a surgical intervention .  [name] history has been reviewed, patient examined, no change in status, stable for surgery.  I have reviewed the patient's chart and labs.  Questions were answered to the patient's satisfaction.     [name]

## 2013-11-10 NOTE — Plan of Care (Signed)
Problem: Consults Goal: Diagnosis - Spinal Surgery Microdiscectomy L1-L2 nucleates propulsos

## 2013-11-10 NOTE — Transfer of Care (Signed)
Immediate Anesthesia Transfer of Care Note  Patient: Jeff Lopez  Procedure(s) Performed: Procedure(s): Left L1-2 far lateral approach to excise herniated nucleus pulposus (N/A)  Patient Location: PACU  Anesthesia Type:General  Level of Consciousness: awake, alert , oriented and patient cooperative  Airway & Oxygen Therapy: Patient Spontanous Breathing and Patient connected to nasal cannula oxygen  Post-op Assessment: Report given to PACU RN and Post -op Vital signs reviewed and stable  Post vital signs: Reviewed  Complications: No apparent anesthesia complications

## 2013-11-10 NOTE — Anesthesia Preprocedure Evaluation (Addendum)
Anesthesia Evaluation  Patient identified by MRN, date of birth, ID band Patient awake    Reviewed: Allergy & Precautions, H&P , NPO status , Patient's Chart, lab work & pertinent test results, reviewed documented beta blocker date and time   History of Anesthesia Complications Negative for: history of anesthetic complications  Airway Mallampati: II  Neck ROM: full    Dental  (+) Teeth Intact, Dental Advisory Given   Pulmonary COPDformer smoker,  breath sounds clear to auscultation        Cardiovascular hypertension, Pt. on medications and Pt. on home beta blockers + CAD Rhythm:Regular Rate:Normal  nonobstructive by cath 2007:  oLAD 20-30%, mLAD 50%, pCFX 20-30%, oAVCFX 20-30%, L renal art 50%;  normal LVF   Neuro/Psych  Headaches,  Neuromuscular disease    GI/Hepatic Neg liver ROS, GERD-  Medicated and Controlled,  Endo/Other  negative endocrine ROS  Renal/GU      Musculoskeletal  (+) Arthritis -, Osteoarthritis,    Abdominal   Peds  Hematology  (+) anemia ,   Anesthesia Other Findings   Reproductive/Obstetrics                          Anesthesia Physical Anesthesia Plan  ASA: II  Anesthesia Plan: General   Post-op Pain Management:    Induction: Intravenous  Airway Management Planned: Oral ETT  Additional Equipment:   Intra-op Plan:   Post-operative Plan: Extubation in OR  Informed Consent: I have reviewed the patients History and Physical, chart, labs and discussed the procedure including the risks, benefits and alternatives for the proposed anesthesia with the patient or authorized representative who has indicated his/her understanding and acceptance.     Plan Discussed with: CRNA, Anesthesiologist and Surgeon  Anesthesia Plan Comments:         Anesthesia Quick Evaluation

## 2013-11-10 NOTE — Anesthesia Postprocedure Evaluation (Signed)
Anesthesia Post Note  Patient: Jeff Lopez  Procedure(s) Performed: Procedure(s) (LRB): Left L1-2 far lateral approach to excise herniated nucleus pulposus (N/A)  Anesthesia type: General  Patient location: PACU  Post pain: Pain level controlled and Adequate analgesia  Post assessment: Post-op Vital signs reviewed, Patient's Cardiovascular Status Stable, Respiratory Function Stable, Patent Airway and Pain level controlled  Last Vitals:  Filed Vitals:   11/10/13 1018  BP:   Pulse: 60  Temp:   Resp: 13    Post vital signs: Reviewed and stable  Level of consciousness: awake, alert  and oriented  Complications: No apparent anesthesia complications

## 2013-11-10 NOTE — Anesthesia Procedure Notes (Signed)
Procedure Name: Intubation Date/Time: 11/10/2013 7:38 AM Performed by: Jenne Campus Pre-anesthesia Checklist: Patient identified, Emergency Drugs available, Suction available, Patient being monitored and Timeout performed Patient Re-evaluated:Patient Re-evaluated prior to inductionOxygen Delivery Method: Circle system utilized Preoxygenation: Pre-oxygenation with 100% oxygen Intubation Type: IV induction Ventilation: Mask ventilation without difficulty and Oral airway inserted - appropriate to patient size Laryngoscope Size: Miller and 2 Grade View: Grade I Tube type: Oral Tube size: 7.5 mm Number of attempts: 1 Airway Equipment and Method: Stylet Placement Confirmation: ETT inserted through vocal cords under direct vision,  positive ETCO2,  CO2 detector and breath sounds checked- equal and bilateral Secured at: 22 cm Tube secured with: Tape Dental Injury: Teeth and Oropharynx as per pre-operative assessment

## 2013-11-10 NOTE — Op Note (Signed)
11/10/2013  9:39 AM  PATIENT:  Jeff Lopez  63 y.o. male  MRN: [id number]  OPERATIVE REPORT  PRE-OPERATIVE DIAGNOSIS:  Left far lateral herniated nucleus pulposus L1-2  POST-OPERATIVE DIAGNOSIS:  Left far lateral herniated nucleus pulposus L1-2  PROCEDURE:  Procedure(s): Left L1-2 far lateral approach to excise herniated nucleus pulposus. OR Microscope     SURGEON:  [name], MD     ASSISTANT:  Phillips Hay, PA-C  (Present throughout the entire procedure and necessary for completion of procedure in a timely manner)     ANESTHESIA:  General,supplemented with local anesthesia marcaine 1/2% and exparel 1.3% 1:1, Dr. Marcie Bal    COMPLICATIONS:  None.    PROCEDURE: The patient was met in the holding area, and the appropriate left L1-2 identified and marked with an "X" and my initials.  The patient was then transported to [place] #5. The patient was then placed under  general anesthesia without difficulty and was placed on the operative table in a prone position. Wilson frame and sliding OR table. The patient received appropriate preoperative antibiotic prophylaxis vancomycin 1 g and ancef 2 gm.   The thoracolumbar spine then prepped using sterile conditions with DuraPrep and draped using sterile technique.  Time-out procedure was called and correct. Loupe magnification and head lamp was used. 2 spinal needles placed to the left of the midline by 3 cm and intraoperative lateral C-arm demonstrated the lower needle directed towards the  L1-2 disc on the left side. Skin infiltrated in the left side paramedian skin area with Marcaine half percent with exparel 1.3% 1:1 10 cc used. Skin incision approximately 3-1/2 cm in length sagittal orientation paramedian approximately 3-1/2 cm for the left of the midline. Incision through skin and subcutaneous layers to the lumbodorsal fascia bleeders controlled using electrocautery. Lumbodorsal fascia then incised in line with the skin incision and  then blunt dissection using a finger down to the level of the transverse processes of the [place]se lateral approach. The MIS dilators then placed in the incision and lateral C-arm demonstrated the dilators slightly superior to the  L1-2 disc space so that these were redirected and exposure obtained of the posterior lateral aspect of the L1-2 level identifying the transverse process of L1 and L2 then placing additional medial and lateral retractors. Small amount of muscle was debrided over the posterior aspect of the intertransverse membrane. And then a small amount of the superior aspect of the transverse process of L2 was resected using 2 and 3 mm Kerrisons releasing the intertransverse membrane and ligament. The operating room microscope carefully draped brought into the field sterilely. Under the operating room microscope then the superior medial and to the transverse process of L2 was carefully exposed and the pedicle of L2 identified. Small leash of vessels cauterized using bipolar electrocautery just medial and superior to the transverse process of L2 at its intersection with the pedicle and the lateral aspect of the superior articular process of L2. Penfield 4 was then used to carefully palpate along the superior lateral aspect of the L2 pedicle identifing the disc space. The injury go retractor and then used the carefully retract the inferior medial aspect of the L1 nerve root superiorly and laterally. With this then identified with careful palpation using a Penfield 4. Penfield #4 then used to perform a vertical opening into the posterior lateral aspect of the  L1-2 disc and the opening into the disc developed using micropituitary as well as pituitaries with teeth directing the pituitaries  medially, resecting disc material. Several small fragments of disc material were resected from the area just beneath the annulus. Intraoperative C-arm fluoroscopy identified the Penfield 4 located within the  L1-2 disc  completing localization during the procedure. Careful inspection of the neuroforamen of a ball-tipped probe as well as a hockey-stick nerve probe demonstrated neuroforamen to be free and the L1 nerve root exiting without further compression following the resection of the small amount of submandibular and entry into the disc material. Additionally the nerve root was freed from pressure posteriorly this area by resecting portion of the intertransverse ligament as it pressed nerve forward against this protruded disc. Irrigation was then carried out using copious amounts of irrigant solution. Minimal bleeding is present felt to represent bleeding from the disc itself. There was no active bleeding that would be cauterized her otherwise. In careful then carefully the MIS retractors were then removed exploring the incision with its removal no active bleeders were noted. Incision then closed using a UR 6-0 Vicryl suture to the lumbodorsal fascia and subcutaneous layers. Superficial layers of proximal with interrupted 2-0 Vicryl sutures and the skin closed with a running subcutaneous stitch of 4-0 Vicryl Dermabond was applied then MedPlex bandage. All instrument and sponge counts were correct. Patient was then returned to supine position reactivated extubated and returned to recovery room in satisfactory condition.  Phillips Hay PA-C perform the duties of assistant surgeon during this case. She was present from the beginning of the case to the end of the case assisting in transfer the patient from his stretcher to the OR table and back to the stretcher at the end of the case. Assisted in careful retraction and suction of the laminectomy site delicate neural structures operating under the operating room microscope. She performed closure of the incision from the fascia to the skin applying the dressing.         [name]  11/10/2013, 9:39 AM

## 2013-11-10 NOTE — Brief Op Note (Signed)
11/10/2013  9:35 AM  PATIENT:  Jeff Lopez  63 y.o. male  PRE-OPERATIVE DIAGNOSIS:  Left far lateral herniated nucleus pulposus L1-2  POST-OPERATIVE DIAGNOSIS:  Left far lateral herniated nucleus pulposus L1-2  PROCEDURE:  Procedure(s): Left L1-2 far lateral approach to excise herniated nucleus pulposus (N/A)  SURGEON:  Surgeon(s) and Role: [name], MD - Primary  PHYSICIAN ASSISTANT: Phillips Hay, PA-C   ANESTHESIA:   regional, supplemented with local anesthesia marcaine 1/2% and exparel, Dr. Marcie Bal  EBL:  Total I/O In: 1700 [I.V.:1700] Out: -   BLOOD ADMINISTERED:none  DRAINS: none   LOCAL MEDICATIONS USED:  MARCAINE 1/2% : Exparel 1.3% 1:1  , Amount: 30 ml   SPECIMEN:  No Specimen  DISPOSITION OF SPECIMEN:  N/A  COUNTS:  YES  TOURNIQUET:  * No tourniquets in log *  DICTATION: .Dragon Dictation  PLAN OF CARE: Admit for overnight observation  PATIENT DISPOSITION:  PACU - hemodynamically stable.   Delay start of Pharmacological VTE agent (>24hrs) due to surgical blood loss or risk of bleeding: yes

## 2013-11-10 NOTE — Addendum Note (Signed)
Addendum created 11/10/13 1607 by Jenne Campus, CRNA   Modules edited: Anesthesia LDA

## 2013-11-10 NOTE — Discharge Instructions (Signed)
    No lifting greater than 10 lbs. Avoid bending, stooping and twisting. Walk in house for first week them may start to get out slowly increasing distance up to one mile by 3 weeks post op. Keep incision dry for 3 days, may use tegaderm or similar water impervious dressing.  

## 2013-11-11 ENCOUNTER — Encounter (HOSPITAL_COMMUNITY): Payer: Self-pay | Admitting: General Practice

## 2013-11-11 NOTE — Evaluation (Signed)
Physical Therapy Evaluation Patient Details Name: Jeff Lopez MRN: [id number] DOB: August 11, 1950 Today's Date: 11/11/2013   History of Present Illness  pt presents with L1-2 excision of hernitated nucleous pulposes.  pt with hx of 3 previous back surgeries and 2 neck surgeries.    Clinical Impression  Pt moving great and indicates this is his 4th back surgery.  Reinforced back precautions and discussed OPPT once cleared by MD to increase core strength.  No further acute PT needs at this time.  Will sign off.      Follow Up Recommendations Outpatient PT;Supervision - Intermittent    Equipment Recommendations  None recommended by PT    Recommendations for Other Services       Precautions / Restrictions Precautions Precautions: Back Precaution Booklet Issued: Yes (comment) Restrictions Weight Bearing Restrictions: No      Mobility  Bed Mobility Overal bed mobility: Modified Independent                Transfers Overall transfer level: Modified independent Equipment used: None                Ambulation/Gait Ambulation/Gait assistance: Modified independent (Device/Increase time) Ambulation Distance (Feet): 500 Feet Assistive device: None Gait Pattern/deviations: Step-through pattern;Decreased stride length   Gait velocity interpretation: Below normal speed for age/gender General Gait Details: pt moves slowly, but without LOB.    Stairs Stairs: Yes Stairs assistance: Supervision Stair Management: One rail Right;Step to pattern;Forwards Number of Stairs: 8 General stair comments: pt moves slowly and needs use of rail.    Wheelchair Mobility    Modified Rankin (Stroke Patients Only)       Balance Overall balance assessment: Modified Independent                                           Pertinent Vitals/Pain After amb pt indicates "a little sore".  RN made aware.      Home Living Family/patient expects to be discharged to::  Private residence Living Arrangements: Alone Available Help at Discharge: Friend(s);Available PRN/intermittently Type of Home: House Home Access: Stairs to enter Entrance Stairs-Rails: Right Entrance Stairs-Number of Steps: 2 Home Layout: Multi-level Home Equipment: None      Prior Function Level of Independence: Independent               Hand Dominance        Extremity/Trunk Assessment   Upper Extremity Assessment: Overall WFL for tasks assessed           Lower Extremity Assessment: Overall WFL for tasks assessed         Communication   Communication: No difficulties  Cognition Arousal/Alertness: Awake/alert Behavior During Therapy: WFL for tasks assessed/performed Overall Cognitive Status: Within Functional Limits for tasks assessed                      General Comments      Exercises        Assessment/Plan    PT Assessment All further PT needs can be met in the next venue of care  PT Diagnosis Difficulty walking   PT Problem List Decreased mobility;Decreased activity tolerance  PT Treatment Interventions     PT Goals (Current goals can be found in the Care Plan section) Acute Rehab PT Goals PT Goal Formulation: No goals set, d/c therapy    Frequency  Barriers to discharge        Co-evaluation               End of Session Equipment Utilized During Treatment: Gait belt Activity Tolerance: Patient tolerated treatment well Patient left: in chair;with call bell/phone within reach Nurse Communication: Mobility status    Functional Assessment Tool Used: Clinical Judgement Functional Limitation: Mobility: Walking and moving around Mobility: Walking and Moving Around Current Status [phone]): 0 percent impaired, limited or restricted Mobility: Walking and Moving Around Goal Status [phone]): 0 percent impaired, limited or restricted Mobility: Walking and Moving Around Discharge Status [phone]): 0 percent impaired, limited or  restricted    Time: 0759-0820 PT Time Calculation (min): 21 min   Charges:   PT Evaluation $Initial PT Evaluation Tier I: 1 Procedure PT Treatments $Gait Training: 8-22 mins   PT G Codes:   Functional Assessment Tool Used: Clinical Judgement Functional Limitation: Mobility: Walking and moving around    [employer], [place] [phone] 11/11/2013, 8:38 AM

## 2013-11-11 NOTE — Progress Notes (Signed)
Patient ID: Jeff Lopez, male   DOB: 09-Jul-1950, 63 y.o.   MRN: [id number] Subjective: 1 Day Post-Op Procedure(s) (LRB): Left L1-2 far lateral approach to excise herniated nucleus pulposus (N/A) Awake, alert and oriented x4. Voiding without difficulty Up with PT/OT doing well, discharged to go home. Patient reports pain as mild.    Objective:   VITALS:  Temp:  [97.6 F (36.4 C)-98.3 F (36.8 C)] 98.2 F (36.8 C) (05/19 0441) Pulse Rate:  [56-70] 62 (05/19 0441) Resp:  [11-20] 16 (05/19 0441) BP: (121-131)/(68-75) 131/72 mmHg (05/19 0441) SpO2:  [96 %-98 %] 96 % (05/19 0441)  Neurologically intact ABD soft Neurovascular intact Sensation intact distally Incision: no drainage   LABS No results found for this basename: HGB, WBC, PLT,  in the last 72 hours No results found for this basename: NA, K, CL, CO2, BUN, CREATININE, GLUCOSE,  in the last 72 hours No results found for this basename: LABPT, INR,  in the last 72 hours   Assessment/Plan: 1 Day Post-Op Procedure(s) (LRB): Left L1-2 far lateral approach to excise herniated nucleus pulposus (N/A)  Advance diet Discharge home with home health  [name] 11/11/2013, 11:22 AM

## 2013-11-26 ENCOUNTER — Ambulatory Visit: Payer: BC Managed Care – PPO

## 2013-11-27 ENCOUNTER — Ambulatory Visit: Payer: BC Managed Care – PPO | Attending: Specialist

## 2013-11-27 DIAGNOSIS — R293 Abnormal posture: Secondary | ICD-10-CM | POA: Insufficient documentation

## 2013-11-27 DIAGNOSIS — IMO0001 Reserved for inherently not codable concepts without codable children: Secondary | ICD-10-CM | POA: Insufficient documentation

## 2013-11-27 DIAGNOSIS — R5381 Other malaise: Secondary | ICD-10-CM | POA: Insufficient documentation

## 2013-11-27 DIAGNOSIS — M545 Low back pain, unspecified: Secondary | ICD-10-CM | POA: Insufficient documentation

## 2013-12-01 NOTE — Discharge Summary (Signed)
Physician Discharge Summary  Patient ID: [name] MRN: [id number] DOB/AGE: [birth year] 63 y.o.  Admit date: 11/10/2013 Discharge date: 11/11/2013  Admission Diagnoses:  Degenerative disc disease, lumbar L1-2 left far lateral and extraforaminal HNP  Discharge Diagnoses:  Principal Problem:   Degenerative disc disease, lumbar Active Problems:   HNP (herniated nucleus pulposus), lumbar L1-2 left far lateral and extraforaminal HNP  Past Medical History  Diagnosis Date  . HYPERLIPIDEMIA 04/09/2007  . HYPERTENSION, BENIGN 04/19/2010  . GERD 04/09/2007  . RENAL CYST 05/27/2010  . OSTEOARTHRITIS, LUMBAR SPINE 04/09/2007  . LUMBAR DISC DISORDER 05/27/2010  . Headache(784.0)   . CAD, NATIVE VESSEL 04/19/2010    nonobstructive by cath 2007:  oLAD 20-30%, mLAD 50%, pCFX 20-30%, oAVCFX 20-30%, L renal art 50%;  normal LVF    Surgeries: Procedure(s): Left L1-2 far lateral approach to excise herniated nucleus pulposus on 11/10/2013   Consultants (if any):  none  Discharged Condition: Improved  Hospital Course: Jeff CHRISTOPHERSON is an 63 y.o. male who was admitted 11/10/2013 with a diagnosis of Degenerative disc disease, lumbar and went to the operating room on 11/10/2013 and underwent the above named procedures.    He was given perioperative antibiotics:      Anti-infectives   Start     Dose/Rate Route Frequency Ordered Stop   11/09/13 1043  vancomycin (VANCOCIN) IVPB 1000 mg/200 mL premix  Status:  Discontinued     1,000 mg 200 mL/hr over 60 Minutes Intravenous On call to O.R. 11/09/13 1043 11/10/13 1352    .  He was given sequential compression devices, early ambulation for DVT prophylaxis.  He benefited maximally from the hospital stay and there were no complications.    Recent vital signs:  Filed Vitals:   11/11/13 0441  BP: 131/72  Pulse: 62  Temp: 98.2 F (36.8 C)  Resp: 16    Recent laboratory studies:  Lab Results  Component Value Date   HGB 15.1  11/05/2013   HGB 12.0* 07/15/2013   HGB 12.0* 07/14/2013   Lab Results  Component Value Date   WBC 5.6 11/05/2013   PLT 187 11/05/2013   Lab Results  Component Value Date   INR 0.86 01/25/2011   Lab Results  Component Value Date   NA 144 11/05/2013   K 3.2* 11/05/2013   CL 105 11/05/2013   CO2 23 11/05/2013   BUN 17 11/05/2013   CREATININE 1.03 11/05/2013   GLUCOSE 166* 11/05/2013    Discharge Medications:     Medication List         aspirin EC 81 MG tablet  Take 1 tablet (81 mg total) by mouth daily.     losartan-hydrochlorothiazide 100-12.5 MG per tablet  Commonly known as:  HYZAAR  Take 1 tablet by mouth daily.     methocarbamol 500 MG tablet  Commonly known as:  ROBAXIN  Take 1 tablet (500 mg total) by mouth every 6 (six) hours as needed for muscle spasms (spasm).     metoprolol succinate 25 MG 24 hr tablet  Commonly known as:  TOPROL-XL  Take 25 mg by mouth daily.     oxyCODONE-acetaminophen 5-325 MG per tablet  Commonly known as:  ROXICET  Take 1-2 tablets by mouth every 4 (four) hours as needed.     pantoprazole 40 MG tablet  Commonly known as:  PROTONIX  Take 40 mg by mouth 2 (two) times daily. Before meals     pregabalin 50 MG capsule  Commonly known as:  LYRICA  Take 50 mg by mouth 3 (three) times daily.        Diagnostic Studies: Dg Lumbar Spine 1 View  11-20-13   CLINICAL DATA:  L1-2 microdiscectomy.  EXAM: DG C-ARM 61-120 MIN; LUMBAR SPINE - 1 VIEW  COMPARISON:  Lumbar MRI 09/25/2013.  FINDINGS: Single cross-table lateral view demonstrates previous lower lumbar fusion. There is a localizing instrument overlying the L1-2 facet joints. Additional surgical instruments are noted posteriorly at L1-2.  IMPRESSION: Intraoperative localization as described.   Electronically Signed   By: Camie Patience M.D.   On: 20-Nov-2013 10:22   Dg C-arm 1-60 Min  11/20/2013   CLINICAL DATA:  L1-2 microdiscectomy.  EXAM: DG C-ARM 61-120 MIN; LUMBAR SPINE - 1 VIEW  COMPARISON:   Lumbar MRI 09/25/2013.  FINDINGS: Single cross-table lateral view demonstrates previous lower lumbar fusion. There is a localizing instrument overlying the L1-2 facet joints. Additional surgical instruments are noted posteriorly at L1-2.  IMPRESSION: Intraoperative localization as described.   Electronically Signed   By: Camie Patience M.D.   On: 11/20/13 10:22    Disposition: 01-Home or Self Care  Discharge Instructions   Call MD / Call 911    Complete by:  As directed   If you experience chest pain or shortness of breath, CALL 911 and be transported to the hospital emergency room.  If you develope a fever above 101 F, pus (white drainage) or increased drainage or redness at the wound, or calf pain, call your surgeon's office.     Constipation Prevention    Complete by:  As directed   Drink plenty of fluids.  Prune juice may be helpful.  You may use a stool softener, such as Colace (over the counter) 100 mg twice a day.  Use MiraLax (over the counter) for constipation as needed.     Diet - low sodium heart healthy    Complete by:  As directed      Discharge instructions    Complete by:  As directed   No lifting greater than 10 lbs. Avoid bending, stooping and twisting. Walk in house for first week them may start to get out slowly increasing distance up to one mile by 3 weeks post op. Keep incision dry for 3 days, may use tegaderm or similar water impervious dressing.     Driving restrictions    Complete by:  As directed   No driving for 2 weeks     Increase activity slowly as tolerated    Complete by:  As directed      Lifting restrictions    Complete by:  As directed   No lifting for 6 weeks           Follow-up Information   Follow up with NITKA,JAMES E, MD In 2 weeks.   Specialty:  Orthopedic Surgery   Contact information:   [place] [address] [phone]        Signed: Epimenio Foot 12/01/2013, 2:44 PM

## 2013-12-02 NOTE — Discharge Summary (Signed)
Patient d/c summary note and lab reviewed.  

## 2013-12-05 ENCOUNTER — Ambulatory Visit: Payer: BC Managed Care – PPO

## 2013-12-11 ENCOUNTER — Ambulatory Visit: Payer: BC Managed Care – PPO

## 2013-12-15 ENCOUNTER — Ambulatory Visit: Payer: BC Managed Care – PPO

## 2013-12-17 ENCOUNTER — Ambulatory Visit: Payer: BC Managed Care – PPO

## 2013-12-19 NOTE — OR Nursing (Signed)
Addendum to scope page 

## 2013-12-30 ENCOUNTER — Ambulatory Visit: Payer: BC Managed Care – PPO | Attending: Specialist

## 2013-12-30 DIAGNOSIS — R5381 Other malaise: Secondary | ICD-10-CM | POA: Insufficient documentation

## 2013-12-30 DIAGNOSIS — M545 Low back pain, unspecified: Secondary | ICD-10-CM | POA: Insufficient documentation

## 2013-12-30 DIAGNOSIS — R293 Abnormal posture: Secondary | ICD-10-CM | POA: Insufficient documentation

## 2013-12-30 DIAGNOSIS — IMO0001 Reserved for inherently not codable concepts without codable children: Secondary | ICD-10-CM | POA: Insufficient documentation

## 2014-01-01 ENCOUNTER — Ambulatory Visit: Payer: BC Managed Care – PPO

## 2014-01-06 ENCOUNTER — Ambulatory Visit: Payer: BC Managed Care – PPO | Admitting: Endocrinology

## 2014-01-06 ENCOUNTER — Ambulatory Visit: Payer: BC Managed Care – PPO

## 2014-01-08 ENCOUNTER — Ambulatory Visit: Payer: BC Managed Care – PPO

## 2014-01-12 ENCOUNTER — Encounter: Payer: BC Managed Care – PPO | Admitting: Physical Therapy

## 2014-01-15 ENCOUNTER — Encounter: Payer: BC Managed Care – PPO | Admitting: Physical Therapy

## 2014-01-16 ENCOUNTER — Ambulatory Visit (INDEPENDENT_AMBULATORY_CARE_PROVIDER_SITE_OTHER): Payer: BC Managed Care – PPO | Admitting: Endocrinology

## 2014-01-16 ENCOUNTER — Other Ambulatory Visit: Payer: Self-pay | Admitting: Cardiovascular Disease

## 2014-01-16 ENCOUNTER — Encounter: Payer: Self-pay | Admitting: Endocrinology

## 2014-01-16 VITALS — BP 130/96 | HR 76 | Temp 97.7°F | Ht 71.0 in | Wt 166.0 lb

## 2014-01-16 DIAGNOSIS — IMO0002 Reserved for concepts with insufficient information to code with codable children: Secondary | ICD-10-CM

## 2014-01-16 DIAGNOSIS — M5410 Radiculopathy, site unspecified: Secondary | ICD-10-CM

## 2014-01-16 MED ORDER — AZITHROMYCIN 500 MG PO TABS: 500.0000 mg | ORAL_TABLET | Freq: Every day | ORAL | Status: DC

## 2014-01-16 MED ORDER — DIAZEPAM 5 MG PO TABS: 5.0000 mg | ORAL_TABLET | Freq: Two times a day (BID) | ORAL | Status: DC | PRN

## 2014-01-16 NOTE — Progress Notes (Signed)
Subjective:    Patient ID: Jeff Lopez, male    DOB: 02/22/1951, 63 y.o.   MRN: [id number]  HPI Pt states 3 days of moderate congestion in the nose, but no assoc cough.  Past Medical History  Diagnosis Date  . HYPERLIPIDEMIA 04/09/2007  . HYPERTENSION, BENIGN 04/19/2010  . GERD 04/09/2007  . RENAL CYST 05/27/2010  . OSTEOARTHRITIS, LUMBAR SPINE 04/09/2007  . LUMBAR DISC DISORDER 05/27/2010  . Headache(784.0)   . CAD, NATIVE VESSEL 04/19/2010    nonobstructive by cath 2007:  oLAD 20-30%, mLAD 50%, pCFX 20-30%, oAVCFX 20-30%, L renal art 50%;  normal LVF    Past Surgical History  Procedure Laterality Date  . Spine surgery  2006    C-spine surgery x 2  . Neck surgery    . Lumbar disc surgery  08/06/2012    L3 & L4  . Foot fracture surgery    . Lumbar laminectomy/decompression microdiscectomy Left 08/10/2012    Procedure: Dura Repair Left Side L2-L3;  Surgeon: [name], MD;  Location: [facility];  Service: Orthopedics;  Laterality: Left;  Wilson Frame, Sliding table, dura repair kit, microscope  . Cholecystectomy    . Cholecystectomy N/A 07/13/2013    Procedure: LAPAROSCOPIC CHOLECYSTECTOMY WITH INTRAOPERATIVE CHOLANGIOGRAM;  Surgeon: Shann Medal, MD;  Location: [facility];  Service: General;  Laterality: N/A;  . Ercp N/A 07/14/2013    Procedure: ENDOSCOPIC RETROGRADE CHOLANGIOPANCREATOGRAPHY (ERCP);  Surgeon: [name], MD;  Location: [facility];  Service: Endoscopy;  Laterality: N/A;  . Cardiac catheterization  2007    Dr Loanne Drilling  . Fracture surgery    . Back surgery  2012,2014  . Lumbar disc surgery  11/11/2015    L1 L2    DR NITKA  . Lumbar laminectomy N/A 11/10/2013    Procedure: Left L1-2 far lateral approach to excise herniated nucleus pulposus;  Surgeon: [name], MD;  Location: [facility];  Service: Orthopedics;  Laterality: N/A;    History   Social History  . Marital Status: Single    Spouse Name: N/A    Number of Children: 1  . Years of Education: N/A     Occupational History  . [employer]    Social History Main Topics  . Smoking status: Former Smoker -- 0.50 packs/day for 44 years    Types: Cigarettes    Quit date: 04/06/2012  . Smokeless tobacco: Never Used     Comment: pt has stopped smoking about 9 months now  . Alcohol Use: No  . Drug Use: No  . Sexual Activity: Not Currently   Other Topics Concern  . Not on file   Social History Narrative   Works in [employer], educational.    Current Outpatient Prescriptions on File Prior to Visit  Medication Sig Dispense Refill  . aspirin EC 81 MG tablet Take 1 tablet (81 mg total) by mouth daily.      [name] losartan-hydrochlorothiazide (HYZAAR) 100-12.5 MG per tablet Take 1 tablet by mouth daily.      . methocarbamol (ROBAXIN) 500 MG tablet Take 1 tablet (500 mg total) by mouth every 6 (six) hours as needed for muscle spasms (spasm).  30 tablet  0  . metoprolol succinate (TOPROL-XL) 25 MG 24 hr tablet Take 25 mg by mouth daily.      . pantoprazole (PROTONIX) 40 MG tablet Take 40 mg by mouth 2 (two) times daily. Before meals       No current facility-administered medications on file prior  to visit.    Allergies  Allergen Reactions  . Ceftin Other (See Comments)    Patient stated it caused "sores in mouth"    Family History  Problem Relation Age of Onset  . Cancer Mother     Breast Cancer, Uterine Cancer  . Breast cancer Mother   . Uterine cancer Mother   . Hypertension Brother   . Heart disease Mother     BP 130/96  Pulse 76  Temp(Src) 97.7 F (36.5 C) (Oral)  Ht _0  (1.803 m)  Wt 166 lb (75.297 kg)  BMI 23.16 kg/m2  SpO2 97%    Review of Systems Denies fever and earache.  Trazodone 300 mg did not help insomnia.  LLE pain persists.    Objective:   Physical Exam VITAL SIGNS:  See vs page GENERAL: no distress head: no deformity eyes: no periorbital swelling, no proptosis external nose and ears are normal mouth: no lesion seen right tm is red, but the left is  normal.       Assessment & Plan:  URI: new Insomnia: mild exacerbation Radicular sxs: persistsnt   Patient is advised the following: Patient Instructions  Please come back soon for a regular physical appointment. i have sent a prescription to your pharmacy, for an antibiotic pill. Loratadine-d (non-prescription) will help your congestion. Here is a prescription to change diazepam to 5 mg at bedtime.  Please see a neurology specialist.  you will receive a phone call, about a day and time for an appointment.

## 2014-01-16 NOTE — Patient Instructions (Addendum)
Please come back soon for a regular physical appointment. i have sent a prescription to your pharmacy, for an antibiotic pill. Loratadine-d (non-prescription) will help your congestion. Here is a prescription to change diazepam to 5 mg at bedtime.  Please see a neurology specialist.  you will receive a phone call, about a day and time for an appointment.

## 2014-01-20 ENCOUNTER — Ambulatory Visit (INDEPENDENT_AMBULATORY_CARE_PROVIDER_SITE_OTHER): Payer: BC Managed Care – PPO | Admitting: Neurology

## 2014-01-20 ENCOUNTER — Ambulatory Visit: Payer: BC Managed Care – PPO | Admitting: Physical Therapy

## 2014-01-20 ENCOUNTER — Encounter: Payer: Self-pay | Admitting: Neurology

## 2014-01-20 VITALS — BP 140/90 | HR 72 | Ht 70.08 in | Wt 171.0 lb

## 2014-01-20 DIAGNOSIS — G2581 Restless legs syndrome: Secondary | ICD-10-CM

## 2014-01-20 DIAGNOSIS — M961 Postlaminectomy syndrome, not elsewhere classified: Secondary | ICD-10-CM

## 2014-01-20 DIAGNOSIS — R209 Unspecified disturbances of skin sensation: Secondary | ICD-10-CM

## 2014-01-20 MED ORDER — PRAMIPEXOLE DIHYDROCHLORIDE 0.125 MG PO TABS: 0.1250 mg | ORAL_TABLET | Freq: Every day | ORAL | Status: DC

## 2014-01-20 NOTE — Patient Instructions (Addendum)
1.  MRI lumbar spine wo contrast 2.  NCS/EMG of the legs -90 min 3.  Continue physical therapy 4.  Trial of Mirapex 0.125mg  - take one tab 3 hours before bedtime x 5 days, if no improvement, then take 2 tab at bedtime 5.  Return to clinic in 6 weeks

## 2014-01-20 NOTE — Progress Notes (Signed)
Note faxed.

## 2014-01-20 NOTE — Progress Notes (Signed)
[facility] Clinic Note - Initial Visit   Date: 01/20/2014  THAO VANOVER MRN: [id number] DOB: [birth year]   Dear Dr Loanne Drilling:  Thank you for your kind referral of [name] for consultation of left leg pain. Although his history is well known to you, please allow [place] to reiterate it for the purpose of our medical record. The patient was accompanied to the clinic by self.    History of Present Illness: Jeff Lopez is a 63 y.o. Caucasian male with history of hypertension, hyperlipidemia, GERD, s/p lumbar laminectomy and decompression for herniated disc L4-L5 and Q2-V9 x 3 complicated by dural leak s/p repair, and cervical discectomy and fusion (C5-7) presenting for evaluation of persistent left leg pain.    In 2012, he started developing atraumatic left leg pain, described as achy.  MRI lumbar spine showed small central disc protrusion L4-5 for which he underwent back surgery by Dr. Basil Dess ([place]).  Following surgery, symptoms improved for about a month but then quickly returned. Because of worsening pain, he underwent imaging of the lumbar spine which showed L2-L4 fusion with intraoperative dural tear and large CSF and the L2-3 on the left with a subcutaneous dorsal seroma. He underwent repair of the dural CSF leak and surgery again in 2014 which transiently improved symptoms. Unfortunately within a few months, he again developed left leg achy pain and underwent lumbar laminectomy for left L1 nerve root encroachment from disc extrusion at left L1-2.  Unfortunately despite having conservative therapy, epidural injections, and three lumbar surgeries, his symptoms have never improved. He is quite frustrated because he is functionally limited by his discomfort and feels helpless at times. He does not have any suicidal ideation.  Pain is constant and worse at night.  He has tried placing ice and heat.  He has received 4-5 epidural  injections by Dr. Ernestina Patches which helps for a while. He has no numbness, tingling, sharp electrical pain.  Activity tends to make it better.  Oxycodone completely resolves pain, but he does not want to be on oxycodone and is interested in "fixing the problem." He has not seen pain management.  He is currently seeing physical therapy and has been using TENS unit.  He has previously been on gabapentin and Lyrica (dose unknown).     Past Medical History  Diagnosis Date  . HYPERLIPIDEMIA 04/09/2007  . HYPERTENSION, BENIGN 04/19/2010  . GERD 04/09/2007  . RENAL CYST 05/27/2010  . OSTEOARTHRITIS, LUMBAR SPINE 04/09/2007  . LUMBAR DISC DISORDER 05/27/2010  . Headache(784.0)   . CAD, NATIVE VESSEL 04/19/2010    nonobstructive by cath 2007:  oLAD 20-30%, mLAD 50%, pCFX 20-30%, oAVCFX 20-30%, L renal art 50%;  normal LVF    Past Surgical History  Procedure Laterality Date  . Spine surgery  2006    C-spine surgery x 2  . Neck surgery    . Lumbar disc surgery  08/06/2012    L3 & L4  . Foot fracture surgery    . Lumbar laminectomy/decompression microdiscectomy Left 08/10/2012    Procedure: Dura Repair Left Side L2-L3;  Surgeon: [name], MD;  Location: [facility];  Service: Orthopedics;  Laterality: Left;  Wilson Frame, Sliding table, dura repair kit, microscope  . Cholecystectomy    . Cholecystectomy N/A 07/13/2013    Procedure: LAPAROSCOPIC CHOLECYSTECTOMY WITH INTRAOPERATIVE CHOLANGIOGRAM;  Surgeon: Shann Medal, MD;  Location: [facility];  Service: General;  Laterality: N/A;  . Ercp N/A 07/14/2013  Procedure: ENDOSCOPIC RETROGRADE CHOLANGIOPANCREATOGRAPHY (ERCP);  Surgeon: [name], MD;  Location: [facility];  Service: Endoscopy;  Laterality: N/A;  . Cardiac catheterization  2007    Dr Loanne Drilling  . Fracture surgery    . Back surgery  2012,2014  . Lumbar disc surgery  11/11/2015    L1 L2    DR NITKA  . Lumbar laminectomy N/A 11/10/2013    Procedure: Left L1-2 far lateral approach to excise  herniated nucleus pulposus;  Surgeon: [name], MD;  Location: [facility];  Service: Orthopedics;  Laterality: N/A;     Medications:  Current Outpatient Prescriptions on File Prior to Visit  Medication Sig Dispense Refill  . aspirin EC 81 MG tablet Take 1 tablet (81 mg total) by mouth daily.      [name] azithromycin (ZITHROMAX) 500 MG tablet Take 1 tablet (500 mg total) by mouth daily.  7 tablet  0  . diazepam (VALIUM) 5 MG tablet Take 1 tablet (5 mg total) by mouth every 12 (twelve) hours as needed for anxiety.  30 tablet  1  . losartan-hydrochlorothiazide (HYZAAR) 100-12.5 MG per tablet take 1 tablet by mouth once daily  30 tablet  5  . methocarbamol (ROBAXIN) 500 MG tablet Take 1 tablet (500 mg total) by mouth every 6 (six) hours as needed for muscle spasms (spasm).  30 tablet  0  . metoprolol succinate (TOPROL-XL) 25 MG 24 hr tablet take 1 tablet by mouth once daily  30 tablet  5  . pantoprazole (PROTONIX) 40 MG tablet Take 40 mg by mouth 2 (two) times daily. Before meals       No current facility-administered medications on file prior to visit.    Allergies:  Allergies  Allergen Reactions  . Ceftin Other (See Comments)    Patient stated it caused "sores in mouth"    Family History: Family History  Problem Relation Age of Onset  . Cancer Mother     Breast Cancer, Uterine Cancer  . Breast cancer Mother   . Uterine cancer Mother   . Hypertension Brother   . Heart disease Mother     Social History: History   Social History  . Marital Status: Single    Spouse Name: N/A    Number of Children: 1  . Years of Education: N/A   Occupational History  . [employer]    Social History Main Topics  . Smoking status: Former Smoker -- 0.50 packs/day for 44 years    Types: Cigarettes    Quit date: 04/06/2012  . Smokeless tobacco: Never Used     Comment: pt has stopped smoking about 9 months now  . Alcohol Use: No  . Drug Use: No  . Sexual Activity: Not Currently   Other  Topics Concern  . Not on file   Social History Narrative   Works in Psychologist, educational. Retired.   Lives alone, has one child in [place].     Review of Systems:  CONSTITUTIONAL: No fevers, chills, night sweats, or weight loss.   EYES: No visual changes or eye pain ENT: No hearing changes.  No history of nose bleeds.   RESPIRATORY: No cough, wheezing and shortness of breath.   CARDIOVASCULAR: Negative for chest pain, and palpitations.   GI: Negative for abdominal discomfort, blood in stools or black stools.  No recent change in bowel habits.   GU:  No history of incontinence.   MUSCLOSKELETAL: +history of joint pain or swelling.  No myalgias.   SKIN:  Negative for lesions, rash, and itching.   HEMATOLOGY/ONCOLOGY: Negative for prolonged bleeding, bruising easily, and swollen nodes.    ENDOCRINE: Negative for cold or heat intolerance, polydipsia or goiter.   PSYCH:  +depression or anxiety symptoms.   NEURO: As Above.   Vital Signs:  BP 140/90  Pulse 72  Ht 5' 10.08" (1.78 m)  Wt 171 lb (77.565 kg)  BMI 24.48 kg/m2  SpO2 97%   General Medical Exam:   General:  Well appearing, comfortable.   Eyes/ENT: see cranial nerve examination.   Neck: No masses appreciated.  Full range of motion without tenderness.  No carotid bruits. Respiratory:  Clear to auscultation, good air entry bilaterally.   Cardiac:  Regular rate and rhythm, no murmur.   Back: + pain to palpation of lower paraspinal muscles Extremities:  No deformities, edema, or skin discoloration. Good capillary refill.   Skin: Old healed midline surgical scar on low back   Neurological Exam: MENTAL STATUS including orientation to time, place, person, recent and remote memory, attention span and concentration, language, and fund of knowledge is normal.  Speech is not dysarthric.  CRANIAL NERVES: II:  No visual field defects.  Unremarkable fundi.   III-IV-VI: Pupils equal round and reactive to light.  Normal conjugate,  extra-ocular eye movements in all directions of gaze.  No nystagmus.  No ptosis. V:  Normal facial sensation.    VII:  Normal facial symmetry and movements.   VIII:  Normal hearing and vestibular function.   IX-X:  Normal palatal movement.   XI:  Normal shoulder shrug and head rotation.   XII:  Normal tongue strength and range of motion, no deviation or fasciculation.  MOTOR:  No atrophy, fasciculations or abnormal movements.  No pronator drift.  Tone is normal.    Right Upper Extremity:    Left Upper Extremity:    Deltoid  5/5   Deltoid  5/5   Biceps  5/5   Biceps  5/5   Triceps  5/5   Triceps  5/5   Wrist extensors  5/5   Wrist extensors  5/5   Wrist flexors  5/5   Wrist flexors  5/5   Finger extensors  5/5   Finger extensors  5/5   Finger flexors  5/5   Finger flexors  5/5   Dorsal interossei  5/5   Dorsal interossei  5/5   Abductor pollicis  5/5   Abductor pollicis  5/5   Tone (Ashworth scale)  0  Tone (Ashworth scale)  0   Right Lower Extremity:    Left Lower Extremity:    Hip flexors  5/5   Hip flexors  5/5   Hip extensors  5/5   Hip extensors  5/5   Knee flexors  5/5   Knee flexors  5/5   Knee extensors  5/5   Knee extensors  5/5   Dorsiflexors  5/5   Dorsiflexors  5/5   Plantarflexors  5/5   Plantarflexors  5/5   Toe extensors  5/5   Toe extensors  5/5   Toe flexors  5/5   Toe flexors  5/5   Tone (Ashworth scale)  0  Tone (Ashworth scale)  0   MSRs:  Right  Left brachioradialis 2+  brachioradialis 2+  biceps 2+  biceps 2+  triceps 2+  triceps 2+  patellar 2+  patellar 1+  ankle jerk 1+  ankle jerk 1+  Hoffman no  Hoffman no  plantar response down  plantar response down   SENSORY:  Normal and symmetric perception of light touch, pinprick, vibration, and proprioception.  Romberg's sign absent.   COORDINATION/GAIT: Normal finger-to- nose-finger and heel-to-shin.  Intact rapid alternating movements  bilaterally.  Able to rise from a chair without using arms.  Gait narrow based and stable.   Data: CT lumbar spine wo contrast 06/07/2013: 1. Stable postsurgical changes following laminectomy and PLIF from L2 through S1. There is still mild lucency surrounding the L2 and S1 pedicle screws, not progressive. There appears to be progressive incorporation of the L2-3 spacer into the adjacent endplates.  2. Left foraminal/extraforaminal disc extrusion at L1-2 with resulting left L1 nerve root encroachment.  3. No significant recurrent spinal stenosis or nerve root encroachment at the operative levels.   CT lumbar myelogram 08/09/2012: Status post L2 through L4 fusion with intraoperative dural tear.  Large cerebrospinal fluid leak emanating from the L2-3 level on the left. Moderate subcutaneous and deep dorsal seroma.  Slight mass effect on the thecal sac at L2-3 and L3-4 on the left, multifactorial.  Nonunion L5-S1. Solid fusion L4-L5.  MRI lumbar spine wo contrast 01/26/2011: 1. The small central disc protrusion at L4-5 seen on the prior study has essentially resolved.  2. Stable bilateral pars defects at L5 S1 with no spondylolisthesis. Probable fibrous union.  Lab Results  Component Value Date   TSH 0.48 11/28/2012   Lab Results  Component Value Date   [id number] 759 05/30/2013   Lab Results  Component Value Date   HGBA1C 6.4 05/30/2013    IMPRESSION: Mr. Genther is a 63 year-old gentleman with history of lumbar fusion and decompression x3 presenting for persistent left leg achy pain. His neurological examination shows preserved motor function and sensation of the lower extremities. His reflexes are reduced at the left patella and bilateral Achilles. There is no upper motor neuron findings.  Based on his history alone, he does not have the typical neuropathic complaints that would be expected with radicular symptoms. He has more of an ache he discomfort, most noticeable at bedtime and with  rest. It is quite possible that he has pain from scar tissue around his previous surgical sites or ongoing pain from nerve encroachment. However, with the latter, I would expect more sharp shooting radiating quality with associated numbness and tingling which he does not have. To better characterize the nature of his symptoms, I would like to obtain an electrodiagnostic testing of the legs. Additionally, repeat MRI of the lumbar region will be performed.  With his atypical presentation, I think it is important to keep a broad differential and other possibilities that may mimic leg discomfort include restless leg syndrome. It would be prudent to offer at least a trial of a dopamine agonist to see if there is any response. In the meantime, he is already undergoing physical therapy for his legs and is scheduled to have epidural injection next month.  I had a very lengthy discussion with the patient regarding his current symptomology. At this point, I do not recommend additional back surgeries since, fortunately, there has been no favorable lasting response.  If he does not find any symptomatic benefit with conservative therapy, pain management may be the next step to see what alternative  options are available to him.  I also explained that there are a few alternative such as tricyclic antidepressants medications or SNRIs which may be considered going forward.   PLAN/RECOMMENDATIONS:  1.  MRI lumbar spine wo contrast 2.  NCS/EMG of the legs  3.  Continue physical therapy 4.  Trial of Mirapex 0.161m - take one tab 3 hours before bedtime x 5 days, if no improvement, then take 2 tab at bedtime 5.  Return to clinic in 6 weeks   The duration of this appointment visit was 60 minutes of face-to-face time with the patient.  Greater than 50% of this time was spent in counseling, explanation of diagnosis, planning of further management, and coordination of care.   Thank you for allowing me to participate in  patient's care.  If I can answer any additional questions, I would be pleased to do so.    Sincerely,    Donika K. PPosey Pronto DO

## 2014-01-22 ENCOUNTER — Telehealth: Payer: Self-pay | Admitting: Endocrinology

## 2014-01-22 ENCOUNTER — Ambulatory Visit: Payer: BC Managed Care – PPO | Admitting: Physical Therapy

## 2014-01-22 NOTE — Telephone Encounter (Signed)
Please read note below and advise.  

## 2014-01-22 NOTE — Telephone Encounter (Signed)
Pt came in was seen by Neuro Dr. Posey Pronto and the nurse for Dr. Posey Pronto told the pt to come down and see if we can give him antidepressants. Dr Loanne Drilling is not in please advise.

## 2014-01-23 NOTE — Telephone Encounter (Signed)
I would prefer Dr. Loanne Drilling do this since I do not know the patient

## 2014-01-23 NOTE — Telephone Encounter (Signed)
He needs to see his PCP 

## 2014-01-23 NOTE — Telephone Encounter (Signed)
Please see note below. Dr Loanne Drilling is out of vacation and will not return August 10th. Please contact pt and let him know.

## 2014-01-23 NOTE — Telephone Encounter (Signed)
Called pt and advised him that Dr Loanne Drilling is on vacation until Aug 10th. Pt stated he will wait until see Dr Loanne Drilling at his appt/PE and discuss it with him.

## 2014-01-25 ENCOUNTER — Inpatient Hospital Stay: Admission: RE | Admit: 2014-01-25 | Payer: BC Managed Care – PPO | Source: Ambulatory Visit

## 2014-01-28 ENCOUNTER — Telehealth: Payer: Self-pay

## 2014-01-28 ENCOUNTER — Telehealth: Payer: Self-pay | Admitting: Neurology

## 2014-01-28 NOTE — Telephone Encounter (Signed)
Received MRI report from [facility] etiology   MRI lumbar spine without contrast dated 09/25/2013 : 1. unchanged left foraminal/extraforaminal disc extrusion at L1-2 likely affecting the left L1 nerve. 2. stable postoperative appearance of the L2-S1 without evidence of recurrent stenosis.  Given the patient has had a recent MRI of the lumbar spine, I would like to cancel the one that I will go to the clinic last week.  Rheannon Cerney K. Posey Pronto, DO

## 2014-01-28 NOTE — Telephone Encounter (Signed)
Patient informed.  MRI was already cancelled because it was not approved.  He will discuss this with you tomorrow.

## 2014-01-28 NOTE — Telephone Encounter (Signed)
Diabetic Bundle. Pt's BP was 140/90. Pt is scheduled for 8/20 BP will be rechecked then.

## 2014-01-29 ENCOUNTER — Ambulatory Visit (INDEPENDENT_AMBULATORY_CARE_PROVIDER_SITE_OTHER): Payer: BC Managed Care – PPO | Admitting: Neurology

## 2014-01-29 ENCOUNTER — Ambulatory Visit: Payer: BC Managed Care – PPO | Attending: Specialist

## 2014-01-29 DIAGNOSIS — M545 Low back pain, unspecified: Secondary | ICD-10-CM | POA: Insufficient documentation

## 2014-01-29 DIAGNOSIS — R5381 Other malaise: Secondary | ICD-10-CM | POA: Diagnosis not present

## 2014-01-29 DIAGNOSIS — M961 Postlaminectomy syndrome, not elsewhere classified: Secondary | ICD-10-CM

## 2014-01-29 DIAGNOSIS — G2581 Restless legs syndrome: Secondary | ICD-10-CM

## 2014-01-29 DIAGNOSIS — R293 Abnormal posture: Secondary | ICD-10-CM | POA: Diagnosis not present

## 2014-01-29 DIAGNOSIS — IMO0001 Reserved for inherently not codable concepts without codable children: Secondary | ICD-10-CM | POA: Insufficient documentation

## 2014-01-29 DIAGNOSIS — R209 Unspecified disturbances of skin sensation: Secondary | ICD-10-CM

## 2014-01-29 NOTE — Procedures (Signed)
[facility]  [place], [address]  [place], [place] [address] Tel: [phone] Fax:  [phone] Test Date:  01/29/2014  Patient: Jeff Lopez DOB: June 30, 1950 Physician: Narda Amber, DO  Sex: Male Height: 5\' 10"  Ref Phys: Narda Amber  ID#: [id number] Temp: 34.2C Technician: Laureen Ochs R. NCS T.   Patient Complaints: This is a 63 year old gentleman with severe multilevel degenerative changes of his cervical and lumbar region s/p back surgery presenting for evaluation of persistent left leg discomfort and pain.   NCV & EMG Findings: Extensive electrodiagnostic testing of the left lower extremity and additional studies of the right reveals:  1. Bilateral sural and superficial peroneal sensory responses are within normal limits.  2. Bilateral peroneal and tibial motor responses are within normal limits.  3. Bilateral H. reflex study showed prolonged latencies. 4. Chronic motor axon loss changes are seen affecting the L3-S1 myotomes on the left with similar changes on the right. There is no evidence of active ongoing denervation.  Impression: These findings are most consistent with a chronic multilevel intraspinal canal lesion (i.e. radiculopathy) affecting the L3-S1 nerve root/segment, worse on the left side. Overall, these findings are mild-to-moderate in degree electrically, worse on the left side.   ___________________________ Narda Amber, DO    Nerve Conduction Studies Anti Sensory Summary Table   Site NR Peak (ms) Norm Peak (ms) P-T Amp (V) Norm P-T Amp  Left Sup Peroneal Anti Sensory (Ant Lat Mall)  12 cm    3.0 <4.6 5.1 >3  Right Sup Peroneal Anti Sensory (Ant Lat Mall)  12 cm    2.7 <4.6 5.6 >3  Left Sural Anti Sensory (Lat Mall)  Calf    3.4 <4.6 7.4 >3  Right Sural Anti Sensory (Lat Mall)  Calf    3.5 <4.6 7.7 >3   Motor Summary Table   Site NR Onset (ms) Norm Onset (ms) O-P Amp (mV) Norm O-P Amp Site1 Site2 Delta-0 (ms) Dist (cm) Vel (m/s)  Norm Vel (m/s)  Left Peroneal Motor (Ext Dig Brev)  Ankle    4.7 <6.0 2.8 >2.5 B Fib Ankle 7.5 32.0 43 >40  B Fib    12.2  2.5  Poplt B Fib 2.1 10.0 48 >40  Poplt    14.3  2.3         Right Peroneal Motor (Ext Dig Brev)  Ankle    4.1 <6.0 5.8 >2.5 B Fib Ankle 7.7 31.0 40 >40  B Fib    11.8  5.5  Poplt B Fib 2.3 10.0 43 >40  Poplt    14.1  5.3         Left Peroneal TA Motor (Tib Ant)  Fib Head    2.5 <4.5 4.7 >3 Poplit Fib Head 1.9 8.0 42 >40  Poplit    4.4  4.6         Right Peroneal TA Motor (Tib Ant)  Fib Head    2.2 <4.5 5.3 >3 Poplit Fib Head 2.3 10.0 43 >40  Poplit    4.5  4.9         Left Tibial Motor (Abd Hall Brev)  Ankle    3.8 <6.0 8.7 >4 Knee Ankle 10.2 42.0 41 >40  Knee    14.0  8.4         Right Tibial Motor (Abd Hall Brev)  Ankle    3.8 <6.0 6.8 >4 Knee Ankle 10.2 45.0 44 >40  Knee    14.0  4.9  H Reflex Studies   NR H-Lat (ms) Lat Norm (ms) L-R H-Lat (ms)  Left Tibial (Gastroc)     38.37 <35 0.41  Right Tibial (Gastroc)     37.96 <35 0.41   EMG   Side Muscle Ins Act Fibs Psw Fasc Number Recrt Dur Dur. Amp Amp. Poly Poly. Comment  Right AntTibialis Nml Nml Nml Nml 1- Mod-V Some 1+ Some 1+ Nml Nml N/A  Right Gastroc Nml Nml Nml Nml 1- Mod Few 1+ Nml Nml Nml Nml N/A  Right Flex Dig Long Nml Nml Nml Nml 1- Mod-V Some 1+ Nml Nml Nml Nml N/A  Right RectFemoris Nml Nml Nml Nml 1- Mod-R Few 1+ Nml Nml Nml Nml N/A  Right BicepsFemS Nml Nml Nml Nml 1- Mod-R Some 1+ Nml Nml Nml Nml N/A  Right GluteusMed Nml Nml Nml Nml 1- Mod-R Some 1+ Some 1+ Nml Nml N/A  Left BicepsFemS Nml Nml Nml Nml 1- Mod-R Few 1+ Nml Nml Nml Nml N/A  Left AntTibialis Nml Nml Nml Nml 1- Mod-R Some 1+ Some 1+ Nml Nml N/A  Left Gastroc Nml Nml Nml Nml 1- Mod-V Few 1+ Nml Nml Nml Nml N/A  Left Flex Dig Long Nml Nml Nml Nml 2- Rapid Some 1+ Some 1+ Nml Nml N/A  Left RectFemoris Nml Nml Nml Nml 1- Mod-R Few 1+ Nml Nml Nml Nml N/A  Left AdductorLong Nml Nml Nml Nml 1- Mod-V Few 1+ Nml Nml Nml  Nml N/A  Left GluteusMed Nml Nml Nml Nml 1- Mod-R Some 1+ Some 1+ Nml Nml N/A     Waveforms:

## 2014-01-30 ENCOUNTER — Ambulatory Visit: Payer: BC Managed Care – PPO

## 2014-01-30 DIAGNOSIS — IMO0001 Reserved for inherently not codable concepts without codable children: Secondary | ICD-10-CM | POA: Diagnosis not present

## 2014-02-02 ENCOUNTER — Ambulatory Visit: Payer: BC Managed Care – PPO

## 2014-02-02 DIAGNOSIS — IMO0001 Reserved for inherently not codable concepts without codable children: Secondary | ICD-10-CM | POA: Diagnosis not present

## 2014-02-05 ENCOUNTER — Ambulatory Visit: Payer: BC Managed Care – PPO

## 2014-02-05 DIAGNOSIS — IMO0001 Reserved for inherently not codable concepts without codable children: Secondary | ICD-10-CM | POA: Diagnosis not present

## 2014-02-09 ENCOUNTER — Ambulatory Visit (HOSPITAL_COMMUNITY): Payer: BC Managed Care – PPO

## 2014-02-10 ENCOUNTER — Ambulatory Visit: Payer: BC Managed Care – PPO | Admitting: Physical Therapy

## 2014-02-10 DIAGNOSIS — IMO0001 Reserved for inherently not codable concepts without codable children: Secondary | ICD-10-CM | POA: Diagnosis not present

## 2014-02-12 ENCOUNTER — Ambulatory Visit (INDEPENDENT_AMBULATORY_CARE_PROVIDER_SITE_OTHER): Payer: BC Managed Care – PPO | Admitting: Endocrinology

## 2014-02-12 ENCOUNTER — Ambulatory Visit: Payer: BC Managed Care – PPO

## 2014-02-12 ENCOUNTER — Encounter: Payer: Self-pay | Admitting: Endocrinology

## 2014-02-12 VITALS — BP 124/72 | HR 45 | Temp 97.7°F | Ht 71.0 in | Wt 172.0 lb

## 2014-02-12 DIAGNOSIS — Z125 Encounter for screening for malignant neoplasm of prostate: Secondary | ICD-10-CM

## 2014-02-12 DIAGNOSIS — D649 Anemia, unspecified: Secondary | ICD-10-CM

## 2014-02-12 DIAGNOSIS — Z0000 Encounter for general adult medical examination without abnormal findings: Secondary | ICD-10-CM

## 2014-02-12 DIAGNOSIS — J4489 Other specified chronic obstructive pulmonary disease: Secondary | ICD-10-CM

## 2014-02-12 DIAGNOSIS — J449 Chronic obstructive pulmonary disease, unspecified: Secondary | ICD-10-CM

## 2014-02-12 DIAGNOSIS — E079 Disorder of thyroid, unspecified: Secondary | ICD-10-CM

## 2014-02-12 DIAGNOSIS — Z23 Encounter for immunization: Secondary | ICD-10-CM

## 2014-02-12 DIAGNOSIS — IMO0001 Reserved for inherently not codable concepts without codable children: Secondary | ICD-10-CM | POA: Diagnosis not present

## 2014-02-12 DIAGNOSIS — R7989 Other specified abnormal findings of blood chemistry: Secondary | ICD-10-CM | POA: Insufficient documentation

## 2014-02-12 DIAGNOSIS — E119 Type 2 diabetes mellitus without complications: Secondary | ICD-10-CM

## 2014-02-12 LAB — LIPID PANEL
Cholesterol: 144 mg/dL (ref 0–200)
HDL: 45.8 mg/dL (ref >39.00)
LDL CALC: 75 mg/dL (ref 0–99)
NonHDL: 98.2
TRIGLYCERIDES: 116 mg/dL (ref 0.0–149.0)
Total CHOL/HDL Ratio: 3
VLDL: 23.2 mg/dL (ref 0.0–40.0)

## 2014-02-12 LAB — HEPATIC FUNCTION PANEL
ALT: 16 U/L (ref 0–53)
AST: 20 U/L (ref 0–37)
Albumin: 3.8 g/dL (ref 3.5–5.2)
Alkaline Phosphatase: 71 U/L (ref 39–117)
Bilirubin, Direct: 0.2 mg/dL (ref 0.0–0.3)
Total Bilirubin: 1 mg/dL (ref 0.2–1.2)
Total Protein: 6.6 g/dL (ref 6.0–8.3)

## 2014-02-12 LAB — BASIC METABOLIC PANEL
BUN: 16 mg/dL (ref 6–23)
CHLORIDE: 105 meq/L (ref 96–112)
CO2: 23 meq/L (ref 19–32)
Calcium: 8.7 mg/dL (ref 8.4–10.5)
Creatinine, Ser: 0.9 mg/dL (ref 0.4–1.5)
GFR: 97.12 mL/min (ref >60.00)
GLUCOSE: 121 mg/dL — AB (ref 70–99)
POTASSIUM: 3.5 meq/L (ref 3.5–5.1)
Sodium: 140 mEq/L (ref 135–145)

## 2014-02-12 LAB — CBC WITH DIFFERENTIAL/PLATELET
BASOS ABS: 0 10*3/uL (ref 0.0–0.1)
BASOS PCT: 0.7 % (ref 0.0–3.0)
EOS ABS: 0.2 10*3/uL (ref 0.0–0.7)
Eosinophils Relative: 3.1 % (ref 0.0–5.0)
HCT: 46.6 % (ref 39.0–52.0)
Hemoglobin: 15.5 g/dL (ref 13.0–17.0)
Lymphocytes Relative: 15.4 % (ref 12.0–46.0)
Lymphs Abs: 1.1 10*3/uL (ref 0.7–4.0)
MCHC: 33.2 g/dL (ref 30.0–36.0)
MCV: 100.1 fl — AB (ref 78.0–100.0)
MONO ABS: 0.5 10*3/uL (ref 0.1–1.0)
Monocytes Relative: 6.9 % (ref 3.0–12.0)
NEUTROS PCT: 73.9 % (ref 43.0–77.0)
Neutro Abs: 5.1 10*3/uL (ref 1.4–7.7)
Platelets: 181 10*3/uL (ref 150.0–400.0)
RBC: 4.65 Mil/uL (ref 4.22–5.81)
RDW: 13.2 % (ref 11.5–15.5)
WBC: 6.8 10*3/uL (ref 4.0–10.5)

## 2014-02-12 LAB — URINALYSIS, ROUTINE W REFLEX MICROSCOPIC
Bilirubin Urine: NEGATIVE
HGB URINE DIPSTICK: NEGATIVE
KETONES UR: NEGATIVE
LEUKOCYTES UA: NEGATIVE
Nitrite: NEGATIVE
Specific Gravity, Urine: 1.015 (ref 1.000–1.030)
UROBILINOGEN UA: 0.2 (ref 0.0–1.0)
Urine Glucose: NEGATIVE
pH: 7 (ref 5.0–8.0)

## 2014-02-12 LAB — IBC PANEL
Iron: 191 ug/dL — ABNORMAL HIGH (ref 42–165)
Saturation Ratios: 64.7 % — ABNORMAL HIGH (ref 20.0–50.0)
TRANSFERRIN: 210.8 mg/dL — AB (ref 212.0–360.0)

## 2014-02-12 LAB — MICROALBUMIN / CREATININE URINE RATIO
Creatinine,U: 234.7 mg/dL
Microalb Creat Ratio: 2.7 mg/g (ref 0.0–30.0)
Microalb, Ur: 6.4 mg/dL — ABNORMAL HIGH (ref 0.0–1.9)

## 2014-02-12 LAB — TSH: TSH: 0.08 u[IU]/mL — ABNORMAL LOW (ref 0.35–4.50)

## 2014-02-12 LAB — HEMOGLOBIN A1C: Hgb A1c MFr Bld: 6.2 % (ref 4.6–6.5)

## 2014-02-12 LAB — PSA: PSA: 1.78 ng/mL (ref 0.10–4.00)

## 2014-02-12 MED ORDER — FLUTICASONE-SALMETEROL 100-50 MCG/DOSE IN AEPB: 1.0000 | INHALATION_SPRAY | Freq: Two times a day (BID) | RESPIRATORY_TRACT | Status: DC

## 2014-02-12 NOTE — Progress Notes (Signed)
Subjective:    Patient ID: Jeff Lopez, male    DOB: [birth year], 63 y.o.   MRN: [id number]  HPI Pt is here for regular wellness examination, and is feeling pretty well in general, and says chronic med probs are stable, except as noted below Past Medical History  Diagnosis Date  . HYPERLIPIDEMIA 04/09/2007  . HYPERTENSION, BENIGN 04/19/2010  . GERD 04/09/2007  . RENAL CYST 05/27/2010  . OSTEOARTHRITIS, LUMBAR SPINE 04/09/2007  . LUMBAR DISC DISORDER 05/27/2010  . Headache(784.0)   . CAD, NATIVE VESSEL 04/19/2010    nonobstructive by cath 2007:  oLAD 20-30%, mLAD 50%, pCFX 20-30%, oAVCFX 20-30%, L renal art 50%;  normal LVF    Past Surgical History  Procedure Laterality Date  . Spine surgery  2006    C-spine surgery x 2  . Neck surgery    . Lumbar disc surgery  08/06/2012    L3 & L4  . Foot fracture surgery    . Lumbar laminectomy/decompression microdiscectomy Left 08/10/2012    Procedure: Dura Repair Left Side L2-L3;  Surgeon: [name], MD;  Location: [facility];  Service: Orthopedics;  Laterality: Left;  Wilson Frame, Sliding table, dura repair kit, microscope  . Cholecystectomy    . Cholecystectomy N/A 07/13/2013    Procedure: LAPAROSCOPIC CHOLECYSTECTOMY WITH INTRAOPERATIVE CHOLANGIOGRAM;  Surgeon: Shann Medal, MD;  Location: [facility];  Service: General;  Laterality: N/A;  . Ercp N/A 07/14/2013    Procedure: ENDOSCOPIC RETROGRADE CHOLANGIOPANCREATOGRAPHY (ERCP);  Surgeon: [name], MD;  Location: [facility];  Service: Endoscopy;  Laterality: N/A;  . Cardiac catheterization  2007    Dr Loanne Drilling  . Fracture surgery    . Back surgery  2012,2014  . Lumbar disc surgery  11/11/2015    L1 L2    DR NITKA  . Lumbar laminectomy N/A 11/10/2013    Procedure: Left L1-2 far lateral approach to excise herniated nucleus pulposus;  Surgeon: [name], MD;  Location: [facility];  Service: Orthopedics;  Laterality: N/A;    History   Social History  . Marital Status: Single   Spouse Name: N/A    Number of Children: 1  . Years of Education: N/A   Occupational History  . [employer]    Social History Main Topics  . Smoking status: Former Smoker -- 0.50 packs/day for 44 years    Types: Cigarettes    Quit date: 04/06/2012  . Smokeless tobacco: Never Used     Comment: pt has stopped smoking about 9 months now  . Alcohol Use: No  . Drug Use: No  . Sexual Activity: Not Currently   Other Topics Concern  . Not on file   Social History Narrative   Works in Psychologist, educational. Retired.   Lives alone, has one child in [place].     Current Outpatient Prescriptions on File Prior to Visit  Medication Sig Dispense Refill  . losartan-hydrochlorothiazide (HYZAAR) 100-12.5 MG per tablet take 1 tablet by mouth once daily  30 tablet  5  . metoprolol succinate (TOPROL-XL) 25 MG 24 hr tablet take 1 tablet by mouth once daily  30 tablet  5  . pantoprazole (PROTONIX) 40 MG tablet Take 40 mg by mouth 2 (two) times daily. Before meals      . aspirin EC 81 MG tablet Take 1 tablet (81 mg total) by mouth daily.      . diazepam (VALIUM) 5 MG tablet Take 1 tablet (5 mg total) by mouth every 12 (  twelve) hours as needed for anxiety.  30 tablet  1  . methocarbamol (ROBAXIN) 500 MG tablet Take 1 tablet (500 mg total) by mouth every 6 (six) hours as needed for muscle spasms (spasm).  30 tablet  0  . pramipexole (MIRAPEX) 0.125 MG tablet Take 1 tablet (0.125 mg total) by mouth at bedtime.  30 tablet  3   No current facility-administered medications on file prior to visit.    Allergies  Allergen Reactions  . Ceftin Other (See Comments)    Patient stated it caused "sores in mouth"    Family History  Problem Relation Age of Onset  . Cancer Mother     Breast Cancer, Uterine Cancer  . Breast cancer Mother   . Uterine cancer Mother   . Hypertension Brother   . Heart disease Mother     BP 124/72  Pulse 45  Temp(Src) 97.7 F (36.5 C) (Oral)  Ht '5\' 11"'  (1.803 m)  Wt 172 lb  (78.019 kg)  BMI 24.00 kg/m2  SpO2 95%     Review of Systems  Constitutional: Negative for fever and unexpected weight change.  HENT: Negative for hearing loss.   Eyes: Negative for visual disturbance.  Respiratory: Negative for cough.   Cardiovascular: Negative for leg swelling.  Gastrointestinal: Negative for anal bleeding.  Endocrine: Negative for cold intolerance.  Genitourinary: Negative for hematuria.  Musculoskeletal: Positive for back pain.  Skin: Positive for rash.  Allergic/Immunologic: Negative for environmental allergies.  Neurological: Negative for syncope.  Hematological: Bruises/bleeds easily.  Psychiatric/Behavioral: Positive for sleep disturbance.       Objective:   Physical Exam VS: see vs page GEN: no distress HEAD: head: no deformity eyes: no periorbital swelling, no proptosis external nose and ears are normal mouth: no lesion seen NECK: supple, thyroid is not enlarged CHEST WALL: no deformity LUNGS: clear to auscultation BREASTS:  No gynecomastia CV: reg rate and rhythm, no murmur ABD: abdomen is soft, nontender.  no hepatosplenomegaly.  not distended.  no hernia. RECTAL: normal external and internal exam.  heme neg. PROSTATE:  Normal size.  No nodule MUSCULOSKELETAL: muscle bulk and strength are grossly normal.  no obvious joint swelling.  gait is normal and steady EXTEMITIES: no deformity.  no ulcer on the feet.  feet are of normal color and temp.  no edema PULSES: dorsalis pedis intact bilat.  no carotid bruit NEURO:  cn 2-12 grossly intact.   readily moves all 4's.  sensation is intact to touch on the feet SKIN:  Normal texture and temperature.  No rash or suspicious lesion is visible.   NODES:  None palpable at the neck PSYCH: alert, well-oriented.  Does not appear anxious nor depressed.        Assessment & Plan:  Wellness visit today, with problems stable, except as noted. we discussed code status.  pt requests full code, but would not  want to be started or maintained on artificial life-support measures if there was not a reasonable chance of recovery.     SEPARATE EVALUATION FOLLOWS--EACH PROBLEM HERE IS NEW, NOT RESPONDING TO TREATMENT, OR POSES SIGNIFICANT RISK TO [name] HEALTH: HISTORY OF THE PRESENT ILLNESS:  Pt states few days of slight sob sensation in the chest, in the context of walking, but no assoc pain.  He quit smoking a few years ago.   PAST MEDICAL HISTORY reviewed and up to date today REVIEW OF SYSTEMS: PHYSICAL EXAMINATION: VITAL SIGNS:  See vs page GENERAL: no distress LAB/XRAY RESULTS: i  reviewed electrocardiogram (i reviewed spirometry tracing) Lab Results  Component Value Date   TSH 0.08* 02/12/2014  Iron is high IMPRESSION: Doe, usually due to deconditioning Elevated fe level, recurrent Hyperthyroidism, mild, new PLAN:  i have sent a prescription to your pharmacy, for an inhaler, on a trial basis please d/c any fe tabs you are taking, and recheck this and TFT in 1 month

## 2014-02-12 NOTE — Patient Instructions (Signed)
please consider these measures for your health:  minimize alcohol.  do not use tobacco products.  have a colonoscopy at least every 10 years from age 63.  keep firearms safely stored.  always use seat belts.  have working smoke alarms in your home.  see an eye doctor and dentist regularly.  never drive under the influence of alcohol or drugs (including prescription drugs).  those with fair skin should take precautions against the sun. i have sent a prescription to your pharmacy, for the inhaler. Please come back for a follow-up appointment in 6 months blood tests are being requested for you today.  We'll contact you with results.

## 2014-02-16 ENCOUNTER — Telehealth: Payer: Self-pay | Admitting: Neurology

## 2014-02-16 NOTE — Telephone Encounter (Signed)
Pt had his EMG on 01/29/14 and Pt would like to know the results since his F/U is not until 9/29/. C/B [phone]

## 2014-02-17 ENCOUNTER — Ambulatory Visit: Payer: BC Managed Care – PPO | Admitting: Physical Therapy

## 2014-02-17 DIAGNOSIS — IMO0001 Reserved for inherently not codable concepts without codable children: Secondary | ICD-10-CM | POA: Diagnosis not present

## 2014-02-17 NOTE — Telephone Encounter (Signed)
Please advise 

## 2014-02-17 NOTE — Telephone Encounter (Signed)
I attempted to contact patient via phone today regarding the results of EMG, however there was no answer and voicemail to leave a message.   Noralee Dutko K. Posey Pronto, DO

## 2014-02-19 ENCOUNTER — Ambulatory Visit: Payer: BC Managed Care – PPO | Admitting: Physical Therapy

## 2014-02-19 DIAGNOSIS — IMO0001 Reserved for inherently not codable concepts without codable children: Secondary | ICD-10-CM | POA: Diagnosis not present

## 2014-02-19 NOTE — Telephone Encounter (Signed)
Called patient and discussed the results of EMG which shows multilevel chronic intraspinal canal lesion affecting the lumbosacral nerve roots. There is no evidence of active disease and I suspect that his symptoms are due to old nerve injury. He continues to have significant pain and I have encouraged him to followup with pain management, which she is already doing.  Jeff Germond K. Posey Pronto, DO

## 2014-03-19 ENCOUNTER — Other Ambulatory Visit (INDEPENDENT_AMBULATORY_CARE_PROVIDER_SITE_OTHER): Payer: BC Managed Care – PPO

## 2014-03-19 ENCOUNTER — Ambulatory Visit (INDEPENDENT_AMBULATORY_CARE_PROVIDER_SITE_OTHER): Payer: BC Managed Care – PPO

## 2014-03-19 ENCOUNTER — Encounter: Payer: BC Managed Care – PPO | Admitting: Neurology

## 2014-03-19 DIAGNOSIS — R7989 Other specified abnormal findings of blood chemistry: Secondary | ICD-10-CM

## 2014-03-19 DIAGNOSIS — E079 Disorder of thyroid, unspecified: Secondary | ICD-10-CM

## 2014-03-19 DIAGNOSIS — Z23 Encounter for immunization: Secondary | ICD-10-CM

## 2014-03-19 LAB — IBC PANEL
Iron: 248 ug/dL — ABNORMAL HIGH (ref 42–165)
Saturation Ratios: 88.3 % — ABNORMAL HIGH (ref 20.0–50.0)
TRANSFERRIN: 200.7 mg/dL — AB (ref 212.0–360.0)

## 2014-03-19 LAB — TSH: TSH: 0.13 u[IU]/mL — ABNORMAL LOW (ref 0.35–4.50)

## 2014-03-19 LAB — T4, FREE: Free T4: 0.81 ng/dL (ref 0.60–1.60)

## 2014-03-23 ENCOUNTER — Telehealth: Payer: Self-pay | Admitting: Endocrinology

## 2014-03-23 MED ORDER — DIAZEPAM 5 MG PO TABS: 5.0000 mg | ORAL_TABLET | Freq: Two times a day (BID) | ORAL | Status: DC | PRN

## 2014-03-23 NOTE — Telephone Encounter (Signed)
Pt needs refill of valium please

## 2014-03-23 NOTE — Telephone Encounter (Addendum)
Rx sent to pharmacy. Unable to contact pt.

## 2014-03-23 NOTE — Telephone Encounter (Signed)
i printed 

## 2014-03-23 NOTE — Telephone Encounter (Signed)
See below Rx was last refilled on 7/24 and pt was last seen on 8/20. Thanks!

## 2014-03-24 ENCOUNTER — Ambulatory Visit: Payer: BC Managed Care – PPO | Admitting: Neurology

## 2014-03-26 ENCOUNTER — Telehealth: Payer: Self-pay | Admitting: Endocrinology

## 2014-03-26 NOTE — Telephone Encounter (Signed)
please call patient: i would be happy to see pt as an add-on at 8 am tomorrow (rather than his current appt in the afternoon), as he is going out of town

## 2014-03-26 NOTE — Telephone Encounter (Signed)
Patient has is sick and has a sore throat and feels really bad and would like for Dr Loanne Drilling to send him in some antibiotic to his pharmacy. He stated that he was going out of town, and want something today so it can start kicking in.

## 2014-03-26 NOTE — Telephone Encounter (Signed)
please call patient: It is not safe to call in Please offer ov at other site or urgent care.

## 2014-03-26 NOTE — Telephone Encounter (Signed)
Pt contacted. He states that he would contact urgent care.

## 2014-03-26 NOTE — Telephone Encounter (Signed)
See below and please advise, Thanks!  

## 2014-03-27 ENCOUNTER — Encounter: Payer: Self-pay | Admitting: Endocrinology

## 2014-03-27 ENCOUNTER — Ambulatory Visit (INDEPENDENT_AMBULATORY_CARE_PROVIDER_SITE_OTHER): Payer: BC Managed Care – PPO | Admitting: Endocrinology

## 2014-03-27 DIAGNOSIS — E0590 Thyrotoxicosis, unspecified without thyrotoxic crisis or storm: Secondary | ICD-10-CM

## 2014-03-27 MED ORDER — AZITHROMYCIN 500 MG PO TABS: 500.0000 mg | ORAL_TABLET | Freq: Every day | ORAL | Status: DC

## 2014-03-27 NOTE — Patient Instructions (Addendum)
i have sent a prescription to your pharmacy, for an antibiotic pill.  Loratadine-d (non-prescription) will help your congestion.  blood tests are being requested for you today.  We'll contact you with results.  let's check a thyroid "scan" (a special, but easy and painless type of thyroid x ray).  It works like this: you go to the x-ray department of the hospital to swallow a pill, which contains a miniscule amount of radiation.  You will not notice any symptoms from this.  You will go back to the x-ray department the next day, to lie down in front of a camera.  The results of this will be sent to me.

## 2014-03-27 NOTE — Progress Notes (Signed)
Subjective:    Patient ID: Jeff Lopez, male    DOB: Jan 29, 1951, 63 y.o.   MRN: [id number]  HPI Pt states few days of moderate congestion in the nose, and assoc left ear pain. Pt says he does not take fe supplements. Past Medical History  Diagnosis Date  . HYPERLIPIDEMIA 04/09/2007  . HYPERTENSION, BENIGN 04/19/2010  . GERD 04/09/2007  . RENAL CYST 05/27/2010  . OSTEOARTHRITIS, LUMBAR SPINE 04/09/2007  . LUMBAR DISC DISORDER 05/27/2010  . Headache(784.0)   . CAD, NATIVE VESSEL 04/19/2010    nonobstructive by cath 2007:  oLAD 20-30%, mLAD 50%, pCFX 20-30%, oAVCFX 20-30%, L renal art 50%;  normal LVF    Past Surgical History  Procedure Laterality Date  . Spine surgery  2006    C-spine surgery x 2  . Neck surgery    . Lumbar disc surgery  08/06/2012    L3 & L4  . Foot fracture surgery    . Lumbar laminectomy/decompression microdiscectomy Left 08/10/2012    Procedure: Dura Repair Left Side L2-L3;  Surgeon: [name], MD;  Location: [facility];  Service: Orthopedics;  Laterality: Left;  Wilson Frame, Sliding table, dura repair kit, microscope  . Cholecystectomy    . Cholecystectomy N/A 07/13/2013    Procedure: LAPAROSCOPIC CHOLECYSTECTOMY WITH INTRAOPERATIVE CHOLANGIOGRAM;  Surgeon: Shann Medal, MD;  Location: [facility];  Service: General;  Laterality: N/A;  . Ercp N/A 07/14/2013    Procedure: ENDOSCOPIC RETROGRADE CHOLANGIOPANCREATOGRAPHY (ERCP);  Surgeon: [name], MD;  Location: [facility];  Service: Endoscopy;  Laterality: N/A;  . Cardiac catheterization  2007    Dr Loanne Drilling  . Fracture surgery    . Back surgery  2012,2014  . Lumbar disc surgery  11/11/2015    L1 L2    DR NITKA  . Lumbar laminectomy N/A 11/10/2013    Procedure: Left L1-2 far lateral approach to excise herniated nucleus pulposus;  Surgeon: [name], MD;  Location: [facility];  Service: Orthopedics;  Laterality: N/A;    History   Social History  . Marital Status: Single    Spouse Name: N/A   Number of Children: 1  . Years of Education: N/A   Occupational History  . [employer]    Social History Main Topics  . Smoking status: Former Smoker -- 0.50 packs/day for 44 years    Types: Cigarettes    Quit date: 04/06/2012  . Smokeless tobacco: Never Used     Comment: pt has stopped smoking about 9 months now  . Alcohol Use: No  . Drug Use: No  . Sexual Activity: Not Currently   Other Topics Concern  . Not on file   Social History Narrative   Works in Psychologist, educational. Retired.   Lives alone, has one child in [place].     Current Outpatient Prescriptions on File Prior to Visit  Medication Sig Dispense Refill  . aspirin EC 81 MG tablet Take 1 tablet (81 mg total) by mouth daily.      . diazepam (VALIUM) 5 MG tablet Take 1 tablet (5 mg total) by mouth every 12 (twelve) hours as needed for anxiety.  30 tablet  5  . Fluticasone-Salmeterol (ADVAIR DISKUS) 100-50 MCG/DOSE AEPB Inhale 1 puff into the lungs 2 (two) times daily.  1 each  11  . gabapentin (NEURONTIN) 300 MG capsule Take 300 mg by mouth 3 (three) times daily.      [name] losartan-hydrochlorothiazide (HYZAAR) 100-12.5 MG per tablet take 1 tablet by  mouth once daily  30 tablet  5  . methocarbamol (ROBAXIN) 500 MG tablet Take 1 tablet (500 mg total) by mouth every 6 (six) hours as needed for muscle spasms (spasm).  30 tablet  0  . metoprolol succinate (TOPROL-XL) 25 MG 24 hr tablet take 1 tablet by mouth once daily  30 tablet  5  . pantoprazole (PROTONIX) 40 MG tablet Take 40 mg by mouth 2 (two) times daily. Before meals      . pramipexole (MIRAPEX) 0.125 MG tablet Take 1 tablet (0.125 mg total) by mouth at bedtime.  30 tablet  3   No current facility-administered medications on file prior to visit.    Allergies  Allergen Reactions  . Ceftin Other (See Comments)    Patient stated it caused "sores in mouth"    Family History  Problem Relation Age of Onset  . Cancer Mother     Breast Cancer, Uterine Cancer  .  Breast cancer Mother   . Uterine cancer Mother   . Hypertension Brother   . Heart disease Mother   . Thyroid disease Neg Hx     BP 122/88  Pulse 77  Temp(Src) 97.7 F (36.5 C) (Oral)  Ht '5\' 11"'  (1.803 m)  Wt 170 lb (77.111 kg)  BMI 23.72 kg/m2  SpO2 98%    Review of Systems He also has sore throat.  No cough or fever.    Objective:   Physical Exam VITAL SIGNS:  See vs page GENERAL: no distress head: no deformity eyes: no periorbital swelling, no proptosis external nose and ears are normal mouth: no lesion seen Left tm is very red (right is normal).   LUNGS:  Clear to auscultation.     Lab Results  Component Value Date   IRON 248* 03/19/2014   Lab Results  Component Value Date   TSH 0.13* 03/19/2014       Assessment & Plan:  URI: new Elevated fe level, persistent Hyperthyroidism, persistent, uncertain etiology   Patient is advised the following: Patient Instructions  i have sent a prescription to your pharmacy, for an antibiotic pill.  Loratadine-d (non-prescription) will help your congestion.  blood tests are being requested for you today.  We'll contact you with results.  let's check a thyroid "scan" (a special, but easy and painless type of thyroid x ray).  It works like this: you go to the x-ray department of the hospital to swallow a pill, which contains a miniscule amount of radiation.  You will not notice any symptoms from this.  You will go back to the x-ray department the next day, to lie down in front of a camera.  The results of this will be sent to me.

## 2014-03-27 NOTE — Telephone Encounter (Signed)
Pt will keep scheduled appointment at 1:45.

## 2014-03-30 ENCOUNTER — Ambulatory Visit: Payer: BC Managed Care – PPO | Admitting: Endocrinology

## 2014-04-01 LAB — HEMOCHROMATOSIS DNA-PCR(C282Y,H63D)

## 2014-04-02 ENCOUNTER — Other Ambulatory Visit: Payer: Self-pay | Admitting: Endocrinology

## 2014-04-07 ENCOUNTER — Encounter (HOSPITAL_COMMUNITY)
Admission: RE | Admit: 2014-04-07 | Discharge: 2014-04-07 | Disposition: A | Discharge status: Home / Self Care | Payer: BC Managed Care – PPO | Source: Ambulatory Visit | Attending: Endocrinology | Admitting: Endocrinology

## 2014-04-07 DIAGNOSIS — E0590 Thyrotoxicosis, unspecified without thyrotoxic crisis or storm: Secondary | ICD-10-CM

## 2014-04-08 ENCOUNTER — Ambulatory Visit (HOSPITAL_COMMUNITY)
Admission: RE | Admit: 2014-04-08 | Discharge: 2014-04-08 | Disposition: A | Discharge status: Home / Self Care | Payer: BC Managed Care – PPO | Source: Ambulatory Visit | Attending: Endocrinology | Admitting: Endocrinology

## 2014-04-08 DIAGNOSIS — J029 Acute pharyngitis, unspecified: Secondary | ICD-10-CM | POA: Insufficient documentation

## 2014-04-08 DIAGNOSIS — E0590 Thyrotoxicosis, unspecified without thyrotoxic crisis or storm: Secondary | ICD-10-CM | POA: Diagnosis present

## 2014-04-08 DIAGNOSIS — R450 Nervousness: Secondary | ICD-10-CM | POA: Diagnosis not present

## 2014-04-08 DIAGNOSIS — R197 Diarrhea, unspecified: Secondary | ICD-10-CM | POA: Diagnosis not present

## 2014-04-08 MED ORDER — SODIUM PERTECHNETATE TC 99M INJECTION
10.0000 | Freq: Once | INTRAVENOUS | Status: AC | PRN
  Administered 2014-04-08: 10 via INTRAVENOUS

## 2014-04-08 MED ORDER — TECHNETIUM TC 99M SESTAMIBI - CARDIOLITE: 10.0000 | Freq: Once | INTRAVENOUS | Status: AC | PRN

## 2014-04-09 ENCOUNTER — Other Ambulatory Visit: Payer: Self-pay | Admitting: Endocrinology

## 2014-04-09 DIAGNOSIS — E0590 Thyrotoxicosis, unspecified without thyrotoxic crisis or storm: Secondary | ICD-10-CM

## 2014-04-14 ENCOUNTER — Ambulatory Visit (INDEPENDENT_AMBULATORY_CARE_PROVIDER_SITE_OTHER): Payer: BC Managed Care – PPO | Admitting: Endocrinology

## 2014-04-14 ENCOUNTER — Encounter: Payer: Self-pay | Admitting: Endocrinology

## 2014-04-14 VITALS — BP 120/72 | HR 57 | Temp 98.6°F | Ht 71.0 in | Wt 167.0 lb

## 2014-04-14 DIAGNOSIS — E0590 Thyrotoxicosis, unspecified without thyrotoxic crisis or storm: Secondary | ICD-10-CM

## 2014-04-14 NOTE — Progress Notes (Signed)
Subjective:    Patient ID: Jeff Lopez, male    DOB: 15-Dec-1950, 63 y.o.   MRN: [id number]  HPI Pt returns for f/u of hyperthyroidism (grave's dx is suggested by nuc med scan).  Pt states persistent moderate pain at the lower back, and assoc weight loss.  He says pain persists despite spinal surgery.  Past Medical History  Diagnosis Date  . HYPERLIPIDEMIA 04/09/2007  . HYPERTENSION, BENIGN 04/19/2010  . GERD 04/09/2007  . RENAL CYST 05/27/2010  . OSTEOARTHRITIS, LUMBAR SPINE 04/09/2007  . LUMBAR DISC DISORDER 05/27/2010  . Headache(784.0)   . CAD, NATIVE VESSEL 04/19/2010    nonobstructive by cath 2007:  oLAD 20-30%, mLAD 50%, pCFX 20-30%, oAVCFX 20-30%, L renal art 50%;  normal LVF    Past Surgical History  Procedure Laterality Date  . Spine surgery  2006    C-spine surgery x 2  . Neck surgery    . Lumbar disc surgery  08/06/2012    L3 & L4  . Foot fracture surgery    . Lumbar laminectomy/decompression microdiscectomy Left 08/10/2012    Procedure: Dura Repair Left Side L2-L3;  Surgeon: [name], MD;  Location: [facility];  Service: Orthopedics;  Laterality: Left;  Wilson Frame, Sliding table, dura repair kit, microscope  . Cholecystectomy    . Cholecystectomy N/A 07/13/2013    Procedure: LAPAROSCOPIC CHOLECYSTECTOMY WITH INTRAOPERATIVE CHOLANGIOGRAM;  Surgeon: Shann Medal, MD;  Location: [facility];  Service: General;  Laterality: N/A;  . Ercp N/A 07/14/2013    Procedure: ENDOSCOPIC RETROGRADE CHOLANGIOPANCREATOGRAPHY (ERCP);  Surgeon: [name], MD;  Location: [facility];  Service: Endoscopy;  Laterality: N/A;  . Cardiac catheterization  2007    Dr Loanne Drilling  . Fracture surgery    . Back surgery  2012,2014  . Lumbar disc surgery  11/11/2015    L1 L2    DR NITKA  . Lumbar laminectomy N/A 11/10/2013    Procedure: Left L1-2 far lateral approach to excise herniated nucleus pulposus;  Surgeon: [name], MD;  Location: [facility];  Service: Orthopedics;  Laterality: N/A;     History   Social History  . Marital Status: Single    Spouse Name: N/A    Number of Children: 1  . Years of Education: N/A   Occupational History  . [employer]    Social History Main Topics  . Smoking status: Former Smoker -- 0.50 packs/day for 44 years    Types: Cigarettes    Quit date: 04/06/2012  . Smokeless tobacco: Never Used     Comment: pt has stopped smoking about 9 months now  . Alcohol Use: No  . Drug Use: No  . Sexual Activity: Not Currently   Other Topics Concern  . Not on file   Social History Narrative   Works in Psychologist, educational. Retired.   Lives alone, has one child in [place].     Current Outpatient Prescriptions on File Prior to Visit  Medication Sig Dispense Refill  . aspirin EC 81 MG tablet Take 1 tablet (81 mg total) by mouth daily.      [name] azithromycin (ZITHROMAX) 500 MG tablet Take 1 tablet (500 mg total) by mouth daily.  7 tablet  0  . diazepam (VALIUM) 5 MG tablet Take 1 tablet (5 mg total) by mouth every 12 (twelve) hours as needed for anxiety.  30 tablet  5  . Fluticasone-Salmeterol (ADVAIR DISKUS) 100-50 MCG/DOSE AEPB Inhale 1 puff into the lungs 2 (two) times daily.  1 each  11  . gabapentin (NEURONTIN) 300 MG capsule Take 300 mg by mouth 3 (three) times daily.      [name] losartan-hydrochlorothiazide (HYZAAR) 100-12.5 MG per tablet take 1 tablet by mouth once daily  30 tablet  5  . methocarbamol (ROBAXIN) 500 MG tablet Take 1 tablet (500 mg total) by mouth every 6 (six) hours as needed for muscle spasms (spasm).  30 tablet  0  . metoprolol succinate (TOPROL-XL) 25 MG 24 hr tablet take 1 tablet by mouth once daily  30 tablet  5  . pantoprazole (PROTONIX) 40 MG tablet Take 40 mg by mouth 2 (two) times daily. Before meals      . pramipexole (MIRAPEX) 0.125 MG tablet Take 1 tablet (0.125 mg total) by mouth at bedtime.  30 tablet  3   No current facility-administered medications on file prior to visit.    Allergies  Allergen Reactions  .  Ceftin Other (See Comments)    Patient stated it caused "sores in mouth"    Family History  Problem Relation Age of Onset  . Cancer Mother     Breast Cancer, Uterine Cancer  . Breast cancer Mother   . Uterine cancer Mother   . Hypertension Brother   . Heart disease Mother   . Thyroid disease Neg Hx     BP 120/72  Pulse 57  Temp(Src) 98.6 F (37 C) (Oral)  Ht _0  (1.803 m)  Wt 167 lb (75.751 kg)  BMI 23.30 kg/m2  SpO2 97%  Review of Systems he has excessive diaphoresis and slight acral numbness.   Denies tremor and fever    Objective:   Physical Exam VITAL SIGNS:  See vs page GENERAL: no distress NECK: There is no palpable thyroid enlargement.  No thyroid nodule is palpable.  No palpable lymphadenopathy at the anterior neck.     Lab Results  Component Value Date   TSH 0.13* 03/19/2014      Assessment & Plan:  Hyperthyroidism: we discussed rx options.  He chooses I-131 rx Chronic pain syndrome.  This limits interpretation of thyroid sxs.   Numbness, new, uncertain etiology.    Patient is advised the following: Patient Instructions  You will be given a treatment pill of radioactive iodine.  Although it is a larger amount of radiation, you will again notice no symptoms from this.  The pill is gone from your body in a few days (during which you should stay away from other people), but takes several months to work.  Therefore, please return here approximately 6-8 weeks after the treatment.  This treatment has been available for many years, and the only known side-effect is an underactive thyroid.  It is possible that i would eventually prescribe for you a thyroid hormone pill, which is very inexpensive.  You don't have to worry about side-effects of this thyroid hormone pill, because it is the same molecule your thyroid makes.

## 2014-04-14 NOTE — Patient Instructions (Addendum)
You will be given a treatment pill of radioactive iodine.  Although it is a larger amount of radiation, you will again notice no symptoms from this.  The pill is gone from your body in a few days (during which you should stay away from other people), but takes several months to work.  Therefore, please return here approximately 6-8 weeks after the treatment.  This treatment has been available for many years, and the only known side-effect is an underactive thyroid.  It is possible that i would eventually prescribe for you a thyroid hormone pill, which is very inexpensive.  You don't have to worry about side-effects of this thyroid hormone pill, because it is the same molecule your thyroid makes.

## 2014-04-20 ENCOUNTER — Telehealth: Payer: Self-pay | Admitting: Endocrinology

## 2014-04-20 NOTE — Telephone Encounter (Signed)
Contacted [facility]. [facility] states they will contact to schedule appointment with pt soon. Referral was mistakenly sent to a different MD and canceled.

## 2014-04-20 NOTE — Telephone Encounter (Signed)
Referral has been sent to [facility]. Called [facility] to discuss if pt has been scheduled. Could not reach [facility] requested call back to discuss.

## 2014-04-20 NOTE — Telephone Encounter (Signed)
Pt has high iron in his blood what are the next steps for him to take to address this concern.

## 2014-04-24 ENCOUNTER — Telehealth: Payer: Self-pay | Admitting: Hematology and Oncology

## 2014-04-24 NOTE — Telephone Encounter (Signed)
S/W PATIENT AND GAVE NP APPT FOR 10/30 @ 11 W/DR. Lillian

## 2014-04-30 ENCOUNTER — Ambulatory Visit: Payer: BC Managed Care – PPO

## 2014-04-30 ENCOUNTER — Telehealth: Payer: Self-pay | Admitting: Hematology and Oncology

## 2014-04-30 ENCOUNTER — Ambulatory Visit (HOSPITAL_BASED_OUTPATIENT_CLINIC_OR_DEPARTMENT_OTHER): Payer: BC Managed Care – PPO

## 2014-04-30 ENCOUNTER — Ambulatory Visit (HOSPITAL_BASED_OUTPATIENT_CLINIC_OR_DEPARTMENT_OTHER): Payer: BC Managed Care – PPO | Admitting: Hematology and Oncology

## 2014-04-30 ENCOUNTER — Encounter: Payer: Self-pay | Admitting: Hematology and Oncology

## 2014-04-30 LAB — CBC WITH DIFFERENTIAL/PLATELET
BASO%: 1 % (ref 0.0–2.0)
BASOS ABS: 0.1 10*3/uL (ref 0.0–0.1)
EOS%: 0.9 % (ref 0.0–7.0)
Eosinophils Absolute: 0.1 10*3/uL (ref 0.0–0.5)
HCT: 42 % (ref 38.4–49.9)
HEMOGLOBIN: 13.8 g/dL (ref 13.0–17.1)
LYMPH#: 1.3 10*3/uL (ref 0.9–3.3)
LYMPH%: 15 % (ref 14.0–49.0)
MCH: 32.2 pg (ref 27.2–33.4)
MCHC: 32.8 g/dL (ref 32.0–36.0)
MCV: 98.1 fL — ABNORMAL HIGH (ref 79.3–98.0)
MONO#: 0.7 10*3/uL (ref 0.1–0.9)
MONO%: 8.9 % (ref 0.0–14.0)
NEUT#: 6.2 10*3/uL (ref 1.5–6.5)
NEUT%: 74.2 % (ref 39.0–75.0)
Platelets: 215 10*3/uL (ref 140–400)
RBC: 4.28 10*6/uL (ref 4.20–5.82)
RDW: 13.1 % (ref 11.0–14.6)
WBC: 8.4 10*3/uL (ref 4.0–10.3)

## 2014-04-30 LAB — FERRITIN CHCC: FERRITIN: 309 ng/mL (ref 22–316)

## 2014-04-30 NOTE — Telephone Encounter (Signed)
Gave avs & cal for Nov, Dec, Jan.

## 2014-04-30 NOTE — Telephone Encounter (Signed)
I reviewed her test result with the patient over the telephone. Ferritin level was slightly over 300. At this point in time, I felt that we could just observed and not proceed with phlebotomy. I will recheck his ferritin level again in 6 months and will start phlebotomy program if it is over 500.

## 2014-04-30 NOTE — Assessment & Plan Note (Signed)
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 The patient has clinical signs of iron overload. I discussed with him the risks, benefit, side effects of phlebotomy and he agreed to proceed. I am drawing a baseline ferritin today. Goalould be to get ferritin level under 100. I plan to order CBC with ferritin with each treatment prior to phlebotomy. The patient is advised to continue on low iron diet. We also discussed the genetic aspects of hemachromatosis. The patient has 1 son and I recommend he discuss this his diagnosis with his son.

## 2014-04-30 NOTE — Progress Notes (Signed)
[facility] CONSULT NOTE  Patient Care Team: Renato Shin, MD as PCP - General (Internal Medicine) Alphonsa Overall, MD as Consulting Physician (General Surgery)  CHIEF COMPLAINTS/PURPOSE OF CONSULTATION:  Hemochromatosis, homozygous for C282Y  HISTORY OF PRESENTING ILLNESS:  Jeff Lopez 63 y.o. male is here because of recent diagnosis of hemochromatosis. The patient has been complaining of arthritis pain. He have borderline normal CBC recently. His primary care physician office drew further blood and DNA test confirmed hemochromatosis. He denies family history of hemachromatosis. Apart from arthritis pain, he has no evidence of organ damage such as diabetes or abnormal liver function tests.  MEDICAL HISTORY:  Past Medical History  Diagnosis Date  . HYPERLIPIDEMIA 04/09/2007  . HYPERTENSION, BENIGN 04/19/2010  . GERD 04/09/2007  . RENAL CYST 05/27/2010  . OSTEOARTHRITIS, LUMBAR SPINE 04/09/2007  . LUMBAR DISC DISORDER 05/27/2010  . Headache(784.0)   . CAD, NATIVE VESSEL 04/19/2010    nonobstructive by cath 2007:  oLAD 20-30%, mLAD 50%, pCFX 20-30%, oAVCFX 20-30%, L renal art 50%;  normal LVF    SURGICAL HISTORY: Past Surgical History  Procedure Laterality Date  . Spine surgery  2006    C-spine surgery x 2  . Neck surgery    . Lumbar disc surgery  08/06/2012    L3 & L4  . Foot fracture surgery    . Lumbar laminectomy/decompression microdiscectomy Left 08/10/2012    Procedure: Dura Repair Left Side L2-L3;  Surgeon: [name], MD;  Location: [facility];  Service: Orthopedics;  Laterality: Left;  Wilson Frame, Sliding table, dura repair kit, microscope  . Cholecystectomy    . Cholecystectomy N/A 07/13/2013    Procedure: LAPAROSCOPIC CHOLECYSTECTOMY WITH INTRAOPERATIVE CHOLANGIOGRAM;  Surgeon: Shann Medal, MD;  Location: [facility];  Service: General;  Laterality: N/A;  . Ercp N/A 07/14/2013    Procedure: ENDOSCOPIC RETROGRADE CHOLANGIOPANCREATOGRAPHY (ERCP);  Surgeon:  [name], MD;  Location: [facility];  Service: Endoscopy;  Laterality: N/A;  . Cardiac catheterization  2007    Dr Loanne Drilling  . Fracture surgery    . Back surgery  2012,2014  . Lumbar disc surgery  11/11/2015    L1 L2    DR NITKA  . Lumbar laminectomy N/A 11/10/2013    Procedure: Left L1-2 far lateral approach to excise herniated nucleus pulposus;  Surgeon: [name], MD;  Location: [facility];  Service: Orthopedics;  Laterality: N/A;    SOCIAL HISTORY: History   Social History  . Marital Status: Single    Spouse Name: N/A    Number of Children: 1  . Years of Education: N/A   Occupational History  . [employer]    Social History Main Topics  . Smoking status: Former Smoker -- 0.50 packs/day for 44 years    Types: Cigarettes    Quit date: 04/06/2012  . Smokeless tobacco: Never Used     Comment: pt has stopped smoking about 9 months now  . Alcohol Use: No  . Drug Use: No  . Sexual Activity: Not Currently   Other Topics Concern  . Not on file   Social History Narrative   Works in Psychologist, educational. Retired.   Lives alone, has one child in [place].     FAMILY HISTORY: Family History  Problem Relation Age of Onset  . Cancer Mother     Breast Cancer, Uterine Cancer  . Breast cancer Mother   . Uterine cancer Mother   . Hypertension Brother   . Heart disease Mother   .  Thyroid disease Neg Hx     ALLERGIES:  is allergic to ceftin.  MEDICATIONS:  Current Outpatient Prescriptions  Medication Sig Dispense Refill  . losartan-hydrochlorothiazide (HYZAAR) 100-12.5 MG per tablet take 1 tablet by mouth once daily 30 tablet 5  . metoprolol succinate (TOPROL-XL) 25 MG 24 hr tablet take 1 tablet by mouth once daily 30 tablet 5  . pantoprazole (PROTONIX) 40 MG tablet Take 40 mg by mouth 2 (two) times daily. Before meals     No current facility-administered medications for this visit.    REVIEW OF SYSTEMS:   Constitutional: Denies fevers, chills or abnormal night  sweats Eyes: Denies blurriness of vision, double vision or watery eyes Ears, nose, mouth, throat, and face: Denies mucositis or sore throat Respiratory: Denies cough, dyspnea or wheezes Cardiovascular: Denies palpitation, chest discomfort or lower extremity swelling Gastrointestinal:  Denies nausea, heartburn or change in bowel habits Skin: Denies abnormal skin rashes Lymphatics: Denies new lymphadenopathy or easy bruising Neurological:Denies numbness, tingling or new weaknesses Behavioral/Psych: Mood is stable, no new changes  All other systems were reviewed with the patient and are negative.  PHYSICAL EXAMINATION: ECOG PERFORMANCE STATUS: 0 - Asymptomatic  Filed Vitals:   04/30/14 0940  BP: 142/67  Pulse: 57  Temp: 98.4 F (36.9 C)  Resp: 18   Filed Weights   04/30/14 0940  Weight: 171 lb 1.6 oz (77.61 kg)    GENERAL:alert, no distress and comfortable SKIN: skin color, texture, turgor are normal, no rashes or significant lesions EYES: normal, conjunctiva are pink and non-injected, sclera clear OROPHARYNX:no exudate, no erythema and lips, buccal mucosa, and tongue normal  NECK: supple, thyroid normal size, non-tender, without nodularity LYMPH:  no palpable lymphadenopathy in the cervical, axillary or inguinal LUNGS: clear to auscultation and percussion with normal breathing effort HEART: regular rate & rhythm and no murmurs and no lower extremity edema ABDOMEN:abdomen soft, non-tender and normal bowel sounds Musculoskeletal:no cyanosis of digits and no clubbing  PSYCH: alert & oriented x 3 with fluent speech NEURO: no focal motor/sensory deficits  LABORATORY DATA:  I have reviewed the data as listed Lab Results  Component Value Date   WBC 6.8 02/12/2014   HGB 15.5 02/12/2014   HCT 46.6 02/12/2014   MCV 100.1* 02/12/2014   PLT 181.0 02/12/2014    Recent Labs  07/14/13 0532 07/15/13 0554 11/05/13 1233 02/12/14 1206  NA 139 138 144 140  K 3.5* 3.3* 3.2* 3.5   CL 100 101 105 105  CO2 '31 26 23 23  ' GLUCOSE 135* 139* 166* 121*  BUN '14 13 17 16  ' CREATININE 1.00 0.82 1.03 0.9  CALCIUM 7.7* 7.9* 8.8 8.7  GFRNONAA 79* >90 76*  --   GFRAA >90 >90 89*  --   PROT 5.4* 5.7* 6.6 6.6  ALBUMIN 2.7* 2.8* 3.9 3.8  AST 139* 41* 15 20  ALT 217* 134* 16 16  ALKPHOS 118* 106 73 71  BILITOT 5.4* 2.6* 1.2 1.0  BILIDIR  --   --   --  0.2   ASSESSMENT & PLAN:  Hemochromatosis The patient tested positive for homozygous C282Y mutation. The patient has clinical signs of iron overload. I discussed with him the risks, benefit, side effects of phlebotomy and he agreed to proceed. I am drawing a baseline ferritin today. Goalould be to get ferritin level under 100. I plan to order CBC with ferritin with each treatment prior to phlebotomy. The patient is advised to continue on low iron diet. We also discussed  the genetic aspects of hemachromatosis. The patient has 1 son and I recommend he discuss this his diagnosis with his son.     Orders Placed This Encounter  Procedures  . Ferritin    Standing Status: Standing     Number of Occurrences: 22     Standing Expiration Date: 05/01/2015  . CBC with Differential    Standing Status: Standing     Number of Occurrences: 22     Standing Expiration Date: 05/01/2015    All questions were answered. The patient knows to call the clinic with any problems, questions or concerns. I spent 30 minutes counseling the patient face to face. The total time spent in the appointment was 40 minutes and more than 50% was on counseling.     [place], [place], MD 04/30/2014 10:50 AM

## 2014-04-30 NOTE — Telephone Encounter (Signed)
mailed pt sched for 2016

## 2014-05-15 ENCOUNTER — Other Ambulatory Visit: Payer: BC Managed Care – PPO

## 2014-05-20 ENCOUNTER — Other Ambulatory Visit: Payer: BC Managed Care – PPO

## 2014-05-29 ENCOUNTER — Other Ambulatory Visit: Payer: BC Managed Care – PPO

## 2014-06-03 ENCOUNTER — Other Ambulatory Visit: Payer: BC Managed Care – PPO

## 2014-06-05 ENCOUNTER — Ambulatory Visit (HOSPITAL_COMMUNITY)
Admission: RE | Admit: 2014-06-05 | Discharge: 2014-06-05 | Disposition: A | Discharge status: Home / Self Care | Payer: BC Managed Care – PPO | Source: Ambulatory Visit | Attending: Endocrinology | Admitting: Endocrinology

## 2014-06-05 DIAGNOSIS — J029 Acute pharyngitis, unspecified: Secondary | ICD-10-CM | POA: Diagnosis not present

## 2014-06-05 DIAGNOSIS — R61 Generalized hyperhidrosis: Secondary | ICD-10-CM | POA: Insufficient documentation

## 2014-06-05 DIAGNOSIS — R450 Nervousness: Secondary | ICD-10-CM | POA: Diagnosis not present

## 2014-06-05 DIAGNOSIS — E0590 Thyrotoxicosis, unspecified without thyrotoxic crisis or storm: Secondary | ICD-10-CM | POA: Insufficient documentation

## 2014-06-05 DIAGNOSIS — G479 Sleep disorder, unspecified: Secondary | ICD-10-CM | POA: Insufficient documentation

## 2014-06-05 DIAGNOSIS — R197 Diarrhea, unspecified: Secondary | ICD-10-CM | POA: Diagnosis not present

## 2014-06-05 MED ORDER — SODIUM IODIDE I 131 CAPSULE
29.7000 | Freq: Once | INTRAVENOUS | Status: AC | PRN
  Administered 2014-06-05: 29.7 via ORAL

## 2014-06-12 ENCOUNTER — Other Ambulatory Visit: Payer: BC Managed Care – PPO

## 2014-06-17 ENCOUNTER — Other Ambulatory Visit: Payer: BC Managed Care – PPO

## 2014-06-25 ENCOUNTER — Ambulatory Visit (INDEPENDENT_AMBULATORY_CARE_PROVIDER_SITE_OTHER): Payer: BC Managed Care – PPO | Admitting: Cardiovascular Disease

## 2014-06-25 ENCOUNTER — Other Ambulatory Visit: Payer: BC Managed Care – PPO

## 2014-06-25 ENCOUNTER — Encounter: Payer: Self-pay | Admitting: Cardiovascular Disease

## 2014-06-25 VITALS — BP 126/90 | HR 56 | Ht 71.0 in | Wt 176.8 lb

## 2014-06-25 DIAGNOSIS — I25119 Atherosclerotic heart disease of native coronary artery with unspecified angina pectoris: Secondary | ICD-10-CM

## 2014-06-25 DIAGNOSIS — R079 Chest pain, unspecified: Secondary | ICD-10-CM

## 2014-06-25 NOTE — Patient Instructions (Signed)
Your physician has requested that you have a lexiscan myoview. For further information please visit [email or web address]. Please follow instruction sheet, as given.  Your physician recommends that you return for lab work ON SAME DAY AS LEXISCAN MYOVIEW. (Lipid/Liver/BMP)  Your physician wants you to follow-up in: 1 year with Dr. Burt Knack. You will receive a reminder letter in the mail two months in advance. If you don't receive a letter, please call our office to schedule the follow-up appointment.  Your physician recommends that you continue on your current medications as directed. Please refer to the Current Medication list given to you today.

## 2014-06-25 NOTE — Progress Notes (Signed)
Background: The patient has a history of nonobstructive coronary artery disease and has been managed medically. The patient had cardiac catheterization in 2007 with 50% stenosis of the LAD noted.  He also is followed for hypertension. Myoview scan was done in 2012. This demonstrated a gated left ventricular ejection fraction of 61% and inferior and apical thinning likely consistent with attenuation artifact. Perfusion was otherwise normal. Overall interpretation was low risk.   HPI:  63 year-old gentleman presenting for follow-up evaluation. The patient has several complaints. He has chronic fatigue but this has been more pronounced recently. He underwent thyroid irradiation for hyperthyroidism about 4 weeks ago. He is scheduled for thyroid studies in the near future. He's also had a cholecystectomy and lumbar back surgery (his 4th back surgery) this year. He complains of episodes of substernal chest pain with a dull characteristic, nonradiating. No shortness of breath, edema, palpitations, or lightheadedness. Chest pain is nonexertional. He has not taken NTG.   Studies:  Lipid Panel     Component Value Date/Time   CHOL 144 02/12/2014 1206   TRIG 116.0 02/12/2014 1206   HDL 45.80 02/12/2014 1206   CHOLHDL 3 02/12/2014 1206   VLDL 23.2 02/12/2014 1206   LDLCALC 75 02/12/2014 1206   LDLDIRECT 115.6 11/28/2012 0947     Outpatient Encounter Prescriptions as of 06/25/2014  Medication Sig  . diazepam (VALIUM) 5 MG tablet Take 5 mg by mouth every 6 (six) hours as needed for anxiety.   [name] losartan-hydrochlorothiazide (HYZAAR) 100-12.5 MG per tablet take 1 tablet by mouth once daily  . metoprolol succinate (TOPROL-XL) 25 MG 24 hr tablet take 1 tablet by mouth once daily  . OXYCONTIN 30 MG T12A Take 1 tablet by mouth continuous as needed (taking for back pain).   . pantoprazole (PROTONIX) 40 MG tablet Take 40 mg by mouth 2 (two) times daily. Before meals    Allergies  Allergen Reactions  .  Ceftin Other (See Comments)    Patient stated it caused "sores in mouth"    Past Medical History  Diagnosis Date  . HYPERLIPIDEMIA 04/09/2007  . HYPERTENSION, BENIGN 04/19/2010  . GERD 04/09/2007  . RENAL CYST 05/27/2010  . OSTEOARTHRITIS, LUMBAR SPINE 04/09/2007  . LUMBAR DISC DISORDER 05/27/2010  . Headache(784.0)   . CAD, NATIVE VESSEL 04/19/2010    nonobstructive by cath 2007:  oLAD 20-30%, mLAD 50%, pCFX 20-30%, oAVCFX 20-30%, L renal art 50%;  normal LVF    family history includes Breast cancer in his mother; Cancer in his mother; Heart attack in his mother; Heart disease in his mother; Hypertension in his brother; Uterine cancer in his mother. There is no history of Thyroid disease.   ROS: Negative except as per HPI  BP 126/90 mmHg  Pulse 56  Ht 5\' 11"  (1.803 m)  Wt 176 lb 12.8 oz (80.196 kg)  BMI 24.67 kg/m2  PHYSICAL EXAM: Pt is alert and oriented, NAD HEENT: normal Neck: JVP - normal, carotids 2+= without bruits Lungs: CTA bilaterally CV: RRR without murmur or gallop Abd: soft, NT, Positive BS, no hepatomegaly Ext: no C/C/E, distal pulses intact and equal Skin: warm/dry no rash  EKG:  Sinus brady 56 bpm, occasional PVC's, nonspecific ST changes  ASSESSMENT AND PLAN: 1. Coronary artery disease, native vessel. He has developed episodes of chest pain with primarily atypical features. However, his pain is dull and substernal, so ischemia has to be considered. His back problems limit his ability to exercise on the treadmill, so will order  a pharmacologic Myoview scan.  2. Hypertension. Blood pressure control is in range. He remains on losartan, hydrochlorothiazide, and metoprolol succinate  3. Hyperlipidemia. Lipids at goal as above on atorvastatin 10 mg daily  4. Tobacco. He has remained off cigarettes for 2 years now.  Sherren Mocha, MD 06/25/2014 11:01 AM

## 2014-06-27 ENCOUNTER — Encounter: Payer: Self-pay | Admitting: Cardiovascular Disease

## 2014-07-01 ENCOUNTER — Other Ambulatory Visit: Payer: BC Managed Care – PPO

## 2014-07-07 ENCOUNTER — Telehealth: Payer: Self-pay | Admitting: Cardiovascular Disease

## 2014-07-07 NOTE — Telephone Encounter (Signed)
Patient cancel test Myoview  schedule for 1/27 due to billing issue per per call to [employer].

## 2014-07-08 ENCOUNTER — Encounter (HOSPITAL_COMMUNITY): Payer: BC Managed Care – PPO

## 2014-07-09 ENCOUNTER — Encounter (HOSPITAL_COMMUNITY): Payer: Self-pay | Admitting: Gastroenterology

## 2014-07-10 ENCOUNTER — Other Ambulatory Visit: Payer: BC Managed Care – PPO

## 2014-07-15 ENCOUNTER — Other Ambulatory Visit: Payer: BC Managed Care – PPO

## 2014-07-17 ENCOUNTER — Ambulatory Visit: Payer: BC Managed Care – PPO | Admitting: Endocrinology

## 2014-07-17 ENCOUNTER — Other Ambulatory Visit: Payer: Self-pay | Admitting: Cardiovascular Disease

## 2014-07-21 ENCOUNTER — Ambulatory Visit: Payer: Self-pay | Admitting: Endocrinology

## 2014-07-22 ENCOUNTER — Other Ambulatory Visit: Payer: BC Managed Care – PPO

## 2014-07-22 ENCOUNTER — Ambulatory Visit (HOSPITAL_COMMUNITY): Payer: Self-pay

## 2014-07-24 ENCOUNTER — Other Ambulatory Visit: Payer: BC Managed Care – PPO

## 2014-07-29 ENCOUNTER — Other Ambulatory Visit: Payer: BC Managed Care – PPO

## 2014-07-30 ENCOUNTER — Other Ambulatory Visit: Payer: Self-pay | Admitting: Gastroenterology

## 2014-08-01 ENCOUNTER — Other Ambulatory Visit: Payer: Self-pay | Admitting: Gastroenterology

## 2014-08-05 ENCOUNTER — Ambulatory Visit (INDEPENDENT_AMBULATORY_CARE_PROVIDER_SITE_OTHER): Payer: BLUE CROSS/BLUE SHIELD | Admitting: Endocrinology

## 2014-08-05 ENCOUNTER — Encounter: Payer: Self-pay | Admitting: Endocrinology

## 2014-08-05 VITALS — BP 134/82 | HR 44 | Temp 97.9°F | Ht 71.0 in | Wt 185.0 lb

## 2014-08-05 DIAGNOSIS — E119 Type 2 diabetes mellitus without complications: Secondary | ICD-10-CM

## 2014-08-05 DIAGNOSIS — E0590 Thyrotoxicosis, unspecified without thyrotoxic crisis or storm: Secondary | ICD-10-CM

## 2014-08-05 LAB — HEMOGLOBIN A1C: Hgb A1c MFr Bld: 6.5 % (ref 4.6–6.5)

## 2014-08-05 LAB — T4, FREE: Free T4: 0.74 ng/dL (ref 0.60–1.60)

## 2014-08-05 LAB — TSH: TSH: 2.3 u[IU]/mL (ref 0.35–4.50)

## 2014-08-05 NOTE — Patient Instructions (Addendum)
blood tests are being requested for you today.  We'll let you know about the results.  Please stop taking the metoprolol and diazepam.   Please come back for a follow-up appointment in 1 month.

## 2014-08-05 NOTE — Progress Notes (Signed)
Subjective:    Patient ID: Jeff Lopez, male    DOB: Jul 19, 1950, 64 y.o.   MRN: [id number]  HPI Pt returns for f/u of hyperthyroidism (grave's dx is suggested by nuc med scan; he had I-131 rx in late 2015).  Pt states slight weight gain, worst at the abdomen, and assoc fatigue Past Medical History  Diagnosis Date  . HYPERLIPIDEMIA 04/09/2007  . HYPERTENSION, BENIGN 04/19/2010  . GERD 04/09/2007  . RENAL CYST 05/27/2010  . OSTEOARTHRITIS, LUMBAR SPINE 04/09/2007  . LUMBAR DISC DISORDER 05/27/2010  . Headache(784.0)   . CAD, NATIVE VESSEL 04/19/2010    nonobstructive by cath 2007:  oLAD 20-30%, mLAD 50%, pCFX 20-30%, oAVCFX 20-30%, L renal art 50%;  normal LVF    Past Surgical History  Procedure Laterality Date  . Spine surgery  2006    C-spine surgery x 2  . Neck surgery    . Lumbar disc surgery  08/06/2012    L3 & L4  . Foot fracture surgery    . Lumbar laminectomy/decompression microdiscectomy Left 08/10/2012    Procedure: Dura Repair Left Side L2-L3;  Surgeon: [name], MD;  Location: [facility];  Service: Orthopedics;  Laterality: Left;  Wilson Frame, Sliding table, dura repair kit, microscope  . Cholecystectomy    . Cholecystectomy N/A 07/13/2013    Procedure: LAPAROSCOPIC CHOLECYSTECTOMY WITH INTRAOPERATIVE CHOLANGIOGRAM;  Surgeon: Shann Medal, MD;  Location: [facility];  Service: General;  Laterality: N/A;  . Ercp N/A 07/14/2013    Procedure: ENDOSCOPIC RETROGRADE CHOLANGIOPANCREATOGRAPHY (ERCP);  Surgeon: [name], MD;  Location: [facility];  Service: Endoscopy;  Laterality: N/A;  . Cardiac catheterization  2007    Dr Loanne Drilling  . Fracture surgery    . Back surgery  2012,2014  . Lumbar disc surgery  11/11/2015    L1 L2    DR NITKA  . Lumbar laminectomy N/A 11/10/2013    Procedure: Left L1-2 far lateral approach to excise herniated nucleus pulposus;  Surgeon: [name], MD;  Location: [facility];  Service: Orthopedics;  Laterality: N/A;    History   Social  History  . Marital Status: Single    Spouse Name: N/A  . Number of Children: 1  . Years of Education: N/A   Occupational History  . DIST[employer]    Social History Main Topics  . Smoking status: Former Smoker -- 0.50 packs/day for 44 years    Types: Cigarettes    Quit date: 04/06/2012  . Smokeless tobacco: Never Used     Comment: pt has stopped smoking about 9 months now  . Alcohol Use: No  . Drug Use: No  . Sexual Activity: Not Currently   Other Topics Concern  . Not on file   Social History Narrative   Works in Psychologist, educational. Retired.   Lives alone, has one child in [place].     Current Outpatient Prescriptions on File Prior to Visit  Medication Sig Dispense Refill  . losartan-hydrochlorothiazide (HYZAAR) 100-12.5 MG per tablet take 1 tablet by mouth once daily 30 tablet 5  . OXYCONTIN 30 MG T12A Take 1 tablet by mouth continuous as needed (taking for back pain).   0  . pantoprazole (PROTONIX) 40 MG tablet Take 40 mg by mouth 2 (two) times daily. Before meals     No current facility-administered medications on file prior to visit.    Allergies  Allergen Reactions  . Ceftin Other (See Comments)    Patient stated it caused "sores  in mouth"    Family History  Problem Relation Age of Onset  . Cancer Mother     Breast Cancer, Uterine Cancer  . Breast cancer Mother   . Uterine cancer Mother   . Heart disease Mother   . Heart attack Mother   . Hypertension Brother   . Thyroid disease Neg Hx     BP 134/82 mmHg  Pulse 44  Temp(Src) 97.9 F (36.6 C) (Oral)  Ht 5' 11" (1.803 m)  Wt 185 lb (83.915 kg)  BMI 25.81 kg/m2  SpO2 97%   Review of Systems Denies edema and LOC    Objective:   Physical Exam VITAL SIGNS:  See vs page GENERAL: no distress Pulses: dorsalis pedis intact bilat.   MSK: no deformity of the feet CV: no leg edema Skin:  no ulcer on the feet.  normal color and temp on the feet. Neuro: sensation is intact to touch on the feet.     Lab Results  Component Value Date   TSH 2.30 08/05/2014   Lab Results  Component Value Date   HGBA1C 6.5 08/05/2014      Assessment & Plan:  Hyperthyroidism, resolved with I-131 rx, but he might develop hypothyroidism, so we'll follow. Bradycardia, new. DM: stable off medication   Patient is advised the following: Patient Instructions  blood tests are being requested for you today.  We'll let you know about the results.  Please stop taking the metoprolol and diazepam.   Please come back for a follow-up appointment in 1 month.

## 2014-09-09 ENCOUNTER — Ambulatory Visit: Payer: BLUE CROSS/BLUE SHIELD | Admitting: Endocrinology

## 2014-10-06 ENCOUNTER — Telehealth: Payer: Self-pay | Admitting: *Deleted

## 2014-10-06 ENCOUNTER — Other Ambulatory Visit: Payer: Self-pay | Admitting: *Deleted

## 2014-10-06 ENCOUNTER — Telehealth: Payer: Self-pay | Admitting: Hematology and Oncology

## 2014-10-06 NOTE — Telephone Encounter (Signed)
Pt called and left message re: pt cannot come for lab appt on 5/3 due to pt will be out of town and will not be back until after June 1.  Spoke with pt and was informed that pt will be eligible for Medicare in June - until then all medical expenses will be out of pocket and can be very expensive.  Pt would like to reschedule lab appt to last part of June.  Onc tx sent to scheduler. Message sent to Dr. Alvy Bimler and Jill Alexanders, desk nurse today. Pt's  Phone  [phone].

## 2014-10-06 NOTE — Telephone Encounter (Signed)
I will send a msg for scheduler to reschedule

## 2014-10-06 NOTE — Telephone Encounter (Signed)
°  Confirm lab reschedule form 05/3 to 06/30.

## 2014-10-27 ENCOUNTER — Other Ambulatory Visit: Payer: BC Managed Care – PPO

## 2014-12-02 ENCOUNTER — Encounter: Payer: Self-pay | Admitting: Cardiology

## 2014-12-10 ENCOUNTER — Other Ambulatory Visit: Payer: Self-pay | Admitting: Specialist

## 2014-12-10 DIAGNOSIS — M545 Low back pain: Secondary | ICD-10-CM

## 2014-12-16 ENCOUNTER — Encounter: Payer: Self-pay | Admitting: Gastroenterology

## 2014-12-16 ENCOUNTER — Telehealth (HOSPITAL_COMMUNITY): Payer: Self-pay | Admitting: *Deleted

## 2014-12-16 NOTE — Telephone Encounter (Signed)
Patient given detailed instructions per Myocardial Perfusion Study Information Sheet for test on 12/21/14 at 9:00. Patient Notified to arrive 15 minutes early, and that it is imperative to arrive on time for appointment to keep from having the test rescheduled. Patient verbalized understanding. Hubbard Robinson, RN

## 2014-12-17 ENCOUNTER — Telehealth: Payer: Self-pay | Admitting: Hematology and Oncology

## 2014-12-17 NOTE — Telephone Encounter (Signed)
lvm for pt appt moved from 11.4 to 11.10 per MD out of the office....mailed pt appt sched adn letter

## 2014-12-21 ENCOUNTER — Ambulatory Visit
Admission: RE | Admit: 2014-12-21 | Discharge: 2014-12-21 | Disposition: A | Discharge status: Home / Self Care | Payer: Medicare Other | Source: Ambulatory Visit | Attending: Specialist | Admitting: Specialist

## 2014-12-21 ENCOUNTER — Ambulatory Visit (HOSPITAL_COMMUNITY): Payer: Medicare Other | Attending: Cardiovascular Disease

## 2014-12-21 ENCOUNTER — Encounter (HOSPITAL_COMMUNITY): Payer: Self-pay

## 2014-12-21 ENCOUNTER — Other Ambulatory Visit (INDEPENDENT_AMBULATORY_CARE_PROVIDER_SITE_OTHER): Payer: Medicare Other | Admitting: *Deleted

## 2014-12-21 DIAGNOSIS — I25119 Atherosclerotic heart disease of native coronary artery with unspecified angina pectoris: Secondary | ICD-10-CM | POA: Insufficient documentation

## 2014-12-21 DIAGNOSIS — M545 Low back pain: Secondary | ICD-10-CM

## 2014-12-21 DIAGNOSIS — R079 Chest pain, unspecified: Secondary | ICD-10-CM | POA: Diagnosis not present

## 2014-12-21 LAB — LIPID PANEL
CHOLESTEROL: 136 mg/dL (ref 0–200)
HDL: 41.6 mg/dL (ref >39.00)
LDL Cholesterol: 68 mg/dL (ref 0–99)
NonHDL: 94.4
TRIGLYCERIDES: 130 mg/dL (ref 0.0–149.0)
Total CHOL/HDL Ratio: 3
VLDL: 26 mg/dL (ref 0.0–40.0)

## 2014-12-21 LAB — HEPATIC FUNCTION PANEL
ALBUMIN: 4.1 g/dL (ref 3.5–5.2)
ALK PHOS: 86 U/L (ref 39–117)
ALT: 20 U/L (ref 0–53)
AST: 18 U/L (ref 0–37)
BILIRUBIN TOTAL: 1 mg/dL (ref 0.2–1.2)
Bilirubin, Direct: 0.1 mg/dL (ref 0.0–0.3)
Total Protein: 6.8 g/dL (ref 6.0–8.3)

## 2014-12-21 LAB — MYOCARDIAL PERFUSION IMAGING
CHL CUP NUCLEAR SDS: 2
CHL CUP NUCLEAR SRS: 2
CHL CUP RESTING HR STRESS: 55 {beats}/min
CSEPPHR: 67 {beats}/min
LHR: 0.28
LV dias vol: 138 mL
LV sys vol: 74 mL
SSS: 4
TID: 1.01

## 2014-12-21 LAB — BASIC METABOLIC PANEL
BUN: 22 mg/dL (ref 6–23)
CHLORIDE: 106 meq/L (ref 96–112)
CO2: 32 mEq/L (ref 19–32)
CREATININE: 1.21 mg/dL (ref 0.40–1.50)
Calcium: 8.8 mg/dL (ref 8.4–10.5)
GFR: 64.43 mL/min (ref >60.00)
Glucose, Bld: 130 mg/dL — ABNORMAL HIGH (ref 70–99)
POTASSIUM: 4.7 meq/L (ref 3.5–5.1)
Sodium: 142 mEq/L (ref 135–145)

## 2014-12-21 MED ORDER — TECHNETIUM TC 99M SESTAMIBI GENERIC - CARDIOLITE
32.8000 | Freq: Once | INTRAVENOUS | Status: AC | PRN
  Administered 2014-12-21: 32.8 via INTRAVENOUS

## 2014-12-21 MED ORDER — REGADENOSON 0.4 MG/5ML IV SOLN
0.4000 mg | Freq: Once | INTRAVENOUS | Status: AC
  Administered 2014-12-21: 0.4 mg via INTRAVENOUS

## 2014-12-21 MED ORDER — TECHNETIUM TC 99M SESTAMIBI GENERIC - CARDIOLITE
10.8000 | Freq: Once | INTRAVENOUS | Status: AC | PRN
Start: 2014-12-21 — End: 2014-12-21
  Administered 2014-12-21: 11 via INTRAVENOUS

## 2014-12-21 MED ORDER — GADOBENATE DIMEGLUMINE 529 MG/ML IV SOLN
17.0000 mL | Freq: Once | INTRAVENOUS | Status: AC | PRN
  Administered 2014-12-21: 17 mL via INTRAVENOUS

## 2014-12-22 ENCOUNTER — Ambulatory Visit (INDEPENDENT_AMBULATORY_CARE_PROVIDER_SITE_OTHER): Payer: Medicare Other | Admitting: Endocrinology

## 2014-12-22 ENCOUNTER — Telehealth: Payer: Self-pay | Admitting: Hematology and Oncology

## 2014-12-22 ENCOUNTER — Encounter: Payer: Self-pay | Admitting: Endocrinology

## 2014-12-22 VITALS — BP 136/79 | HR 65 | Temp 97.7°F | Ht 71.0 in | Wt 184.0 lb

## 2014-12-22 DIAGNOSIS — E0590 Thyrotoxicosis, unspecified without thyrotoxic crisis or storm: Secondary | ICD-10-CM | POA: Diagnosis not present

## 2014-12-22 LAB — T4, FREE: FREE T4: 0.54 ng/dL — AB (ref 0.60–1.60)

## 2014-12-22 LAB — TSH: TSH: 32.39 u[IU]/mL — AB (ref 0.35–4.50)

## 2014-12-22 MED ORDER — LEVOTHYROXINE SODIUM 100 MCG PO TABS: 100.0000 ug | ORAL_TABLET | Freq: Every day | ORAL | Status: DC

## 2014-12-22 NOTE — Telephone Encounter (Signed)
s.w. pt and cx 6.30 appt per pt...he says he has given blood and everything is ok.Jeff KitchenMarland KitchenMarland Lopez

## 2014-12-22 NOTE — Patient Instructions (Addendum)
blood tests are requested for you today.  We'll let you know about the results.  Please come back for a regular physical appointment in 2 months.

## 2014-12-22 NOTE — Progress Notes (Signed)
Subjective:    Patient ID: Jeff Lopez, male    DOB: 21-Apr-1951, 64 y.o.   MRN: [id number]  HPI Pt returns for f/u of hyperthyroidism (grave's dx is suggested by nuc med scan; he had I-131 rx in late 2015).  pt states he feels well in general, except for chronic low-back pain. Past Medical History  Diagnosis Date  . HYPERLIPIDEMIA 04/09/2007  . HYPERTENSION, BENIGN 04/19/2010  . GERD 04/09/2007  . RENAL CYST 05/27/2010  . OSTEOARTHRITIS, LUMBAR SPINE 04/09/2007  . LUMBAR DISC DISORDER 05/27/2010  . Headache(784.0)   . CAD, NATIVE VESSEL 04/19/2010    nonobstructive by cath 2007:  oLAD 20-30%, mLAD 50%, pCFX 20-30%, oAVCFX 20-30%, L renal art 50%;  normal LVF    Past Surgical History  Procedure Laterality Date  . Spine surgery  2006    C-spine surgery x 2  . Neck surgery    . Lumbar disc surgery  08/06/2012    L3 & L4  . Foot fracture surgery    . Lumbar laminectomy/decompression microdiscectomy Left 08/10/2012    Procedure: Dura Repair Left Side L2-L3;  Surgeon: [name], MD;  Location: [facility];  Service: Orthopedics;  Laterality: Left;  Wilson Frame, Sliding table, dura repair kit, microscope  . Cholecystectomy    . Cholecystectomy N/A 07/13/2013    Procedure: LAPAROSCOPIC CHOLECYSTECTOMY WITH INTRAOPERATIVE CHOLANGIOGRAM;  Surgeon: Shann Medal, MD;  Location: [facility];  Service: General;  Laterality: N/A;  . Ercp N/A 07/14/2013    Procedure: ENDOSCOPIC RETROGRADE CHOLANGIOPANCREATOGRAPHY (ERCP);  Surgeon: [name], MD;  Location: [facility];  Service: Endoscopy;  Laterality: N/A;  . Cardiac catheterization  2007    Dr Loanne Drilling  . Fracture surgery    . Back surgery  2012,2014  . Lumbar disc surgery  11/11/2015    L1 L2    DR NITKA  . Lumbar laminectomy N/A 11/10/2013    Procedure: Left L1-2 far lateral approach to excise herniated nucleus pulposus;  Surgeon: [name], MD;  Location: [facility];  Service: Orthopedics;  Laterality: N/A;    History   Social  History  . Marital Status: Single    Spouse Name: N/A  . Number of Children: 1  . Years of Education: N/A   Occupational History  . [employer]    Social History Main Topics  . Smoking status: Former Smoker -- 0.50 packs/day for 44 years    Types: Cigarettes    Quit date: 04/06/2012  . Smokeless tobacco: Never Used     Comment: pt has stopped smoking about 9 months now  . Alcohol Use: No  . Drug Use: No  . Sexual Activity: Not Currently   Other Topics Concern  . Not on file   Social History Narrative   Works in Psychologist, educational. Retired.   Lives alone, has one child in [place].     Current Outpatient Prescriptions on File Prior to Visit  Medication Sig Dispense Refill  . losartan-hydrochlorothiazide (HYZAAR) 100-12.5 MG per tablet take 1 tablet by mouth once daily 30 tablet 5  . OXYCONTIN 30 MG T12A Take 1 tablet by mouth continuous as needed (taking for back pain).   0  . pantoprazole (PROTONIX) 40 MG tablet Take 40 mg by mouth 2 (two) times daily. Before meals     No current facility-administered medications on file prior to visit.    Allergies  Allergen Reactions  . Ceftin Other (See Comments)    Patient stated it caused "sores  in mouth"    Family History  Problem Relation Age of Onset  . Cancer Mother     Breast Cancer, Uterine Cancer  . Breast cancer Mother   . Uterine cancer Mother   . Heart disease Mother   . Heart attack Mother   . Hypertension Brother   . Thyroid disease Neg Hx     BP 136/79 mmHg  Pulse 65  Temp(Src) 97.7 F (36.5 C) (Oral)  Ht '5\' 11"'  (1.803 m)  Wt 184 lb (83.462 kg)  BMI 25.67 kg/m2  SpO2 96%  Review of Systems Denies weight change.    Objective:   Physical Exam VITAL SIGNS:  See vs page GENERAL: no distress NECK: There is no palpable thyroid enlargement.  No thyroid nodule is palpable.  No palpable lymphadenopathy at the anterior neck.   Lab Results  Component Value Date   TSH 32.39* 12/22/2014         Assessment & Plan:  Post-I-131 hypothyroidism, new i have sent a prescription to your pharmacy, for synthroid. Please come back for a regular physical appointment in 2 months.

## 2014-12-23 ENCOUNTER — Telehealth: Payer: Self-pay | Admitting: Endocrinology

## 2014-12-23 NOTE — Telephone Encounter (Signed)
Pt called and would like to know his blood work results, please call pt  [phone]

## 2014-12-23 NOTE — Telephone Encounter (Signed)
I contacted the pt and advised his recent blood work shows his thyroid has gone low. Pt will begin taking 121mcg of levothyroxine. Pt voiced understanding

## 2014-12-24 ENCOUNTER — Other Ambulatory Visit: Payer: BLUE CROSS/BLUE SHIELD

## 2015-01-16 ENCOUNTER — Other Ambulatory Visit: Payer: Self-pay | Admitting: Cardiovascular Disease

## 2015-01-18 ENCOUNTER — Emergency Department (INDEPENDENT_AMBULATORY_CARE_PROVIDER_SITE_OTHER): Payer: Medicare Other

## 2015-01-18 ENCOUNTER — Emergency Department (HOSPITAL_COMMUNITY)
Admission: EM | Admit: 2015-01-18 | Discharge: 2015-01-18 | Disposition: A | Discharge status: Home / Self Care | Payer: Medicare Other | Source: Home / Self Care | Attending: Emergency Medicine | Admitting: Emergency Medicine

## 2015-01-18 ENCOUNTER — Encounter (HOSPITAL_COMMUNITY): Payer: Self-pay | Admitting: Emergency Medicine

## 2015-01-18 DIAGNOSIS — M5412 Radiculopathy, cervical region: Secondary | ICD-10-CM | POA: Diagnosis not present

## 2015-01-18 MED ORDER — PREDNISONE 50 MG PO TABS: ORAL_TABLET | ORAL | Status: DC

## 2015-01-18 NOTE — ED Provider Notes (Signed)
CSN: [id number]     Arrival date & time 01/18/15  57 History   First MD Initiated Contact with Patient 01/18/15 1740     Chief Complaint  Patient presents with  . Shoulder Pain  CC: left neck pain  (Consider location/radiation/quality/duration/timing/severity/associated sxs/prior Treatment) HPI  He is a 64 year old man here for evaluation of left-sided neck pain. He states this started 3-4 days ago, but got significantly worse last night. He reports a burning and numb type pain in his left neck that radiates to his shoulder and all the way down to his hand. He reports paresthesias in his hand. No weakness. He denies any injury or trauma. He does have a history of 4 back surgeries and 2 neck surgeries. He called [facility], where his surgeon is, and was told they were booked up and he needed to go to the urgent care. He has OxyContin at home for his back pain. He states he took one of those last night without improvement. He is also tried ice and heat which did not help, and actually made the pain worse.  Past Medical History  Diagnosis Date  . HYPERLIPIDEMIA 04/09/2007  . HYPERTENSION, BENIGN 04/19/2010  . GERD 04/09/2007  . RENAL CYST 05/27/2010  . OSTEOARTHRITIS, LUMBAR SPINE 04/09/2007  . LUMBAR DISC DISORDER 05/27/2010  . Headache(784.0)   . CAD, NATIVE VESSEL 04/19/2010    nonobstructive by cath 2007:  oLAD 20-30%, mLAD 50%, pCFX 20-30%, oAVCFX 20-30%, L renal art 50%;  normal LVF   Past Surgical History  Procedure Laterality Date  . Spine surgery  2006    C-spine surgery x 2  . Neck surgery    . Lumbar disc surgery  08/06/2012    L3 & L4  . Foot fracture surgery    . Lumbar laminectomy/decompression microdiscectomy Left 08/10/2012    Procedure: Dura Repair Left Side L2-L3;  Surgeon: [name], MD;  Location: [facility];  Service: Orthopedics;  Laterality: Left;  Wilson Frame, Sliding table, dura repair kit, microscope  . Cholecystectomy    . Cholecystectomy N/A  07/13/2013    Procedure: LAPAROSCOPIC CHOLECYSTECTOMY WITH INTRAOPERATIVE CHOLANGIOGRAM;  Surgeon: Shann Medal, MD;  Location: [facility];  Service: General;  Laterality: N/A;  . Ercp N/A 07/14/2013    Procedure: ENDOSCOPIC RETROGRADE CHOLANGIOPANCREATOGRAPHY (ERCP);  Surgeon: [name], MD;  Location: [facility];  Service: Endoscopy;  Laterality: N/A;  . Cardiac catheterization  2007    Dr Loanne Drilling  . Fracture surgery    . Back surgery  2012,2014  . Lumbar disc surgery  11/11/2015    L1 L2    DR NITKA  . Lumbar laminectomy N/A 11/10/2013    Procedure: Left L1-2 far lateral approach to excise herniated nucleus pulposus;  Surgeon: [name], MD;  Location: [facility];  Service: Orthopedics;  Laterality: N/A;   Family History  Problem Relation Age of Onset  . Cancer Mother     Breast Cancer, Uterine Cancer  . Breast cancer Mother   . Uterine cancer Mother   . Heart disease Mother   . Heart attack Mother   . Hypertension Brother   . Thyroid disease Neg Hx    History  Substance Use Topics  . Smoking status: Former Smoker -- 0.50 packs/day for 44 years    Types: Cigarettes    Quit date: 04/06/2012  . Smokeless tobacco: Never Used     Comment: pt has stopped smoking about 9 months now  . Alcohol Use: No  Review of Systems As in history of present illness Allergies  Ceftin  Home Medications   Prior to Admission medications   Medication Sig Start Date End Date Taking? Authorizing Provider  metoprolol tartrate (LOPRESSOR) 25 MG tablet Take 25 mg by mouth 2 (two) times daily.   Yes Historical Provider, MD  levothyroxine (SYNTHROID, LEVOTHROID) 100 MCG tablet Take 1 tablet (100 mcg total) by mouth daily. 12/22/14   Renato Shin, MD  losartan-hydrochlorothiazide [facility]) 100-12.5 MG per tablet take 1 tablet by mouth once daily 07/20/14   Sherren Mocha, MD  OXYCONTIN 30 MG T12A Take 1 tablet by mouth continuous as needed (taking for back pain).  06/22/14   Historical Provider, MD   pantoprazole (PROTONIX) 40 MG tablet Take 40 mg by mouth 2 (two) times daily. Before meals    Historical Provider, MD  predniSONE (DELTASONE) 50 MG tablet Take 1 pill daily for 5 days. 01/18/15   Melony Overly, MD   BP 165/80 mmHg  Pulse 66  Temp(Src) 97.9 F (36.6 C) (Oral)  Resp 16  SpO2 98% Physical Exam  Constitutional: He is oriented to person, place, and time. He appears well-developed and well-nourished. No distress.  Neck:  No vertebral tenderness. Spurling's positive on the left. He has 5 out of 5 strength in bilateral upper extremities. 2+ radial pulses.  Subjective paresthesias in the left hand.  Cardiovascular: Normal rate.   Pulmonary/Chest: Effort normal.  Neurological: He is alert and oriented to person, place, and time.    ED Course  Procedures (including critical care time) Labs Review Labs Reviewed - No data to display  Imaging Review Dg Cervical Spine Complete  01/18/2015   CLINICAL DATA:  Severe pain in the left shoulder 3 days. History of surgery on the neck. No known injury.  EXAM: CERVICAL SPINE  4+ VIEWS  COMPARISON:  05/30/2013  FINDINGS: Status post anterior fusion C5-C7. Mild degenerative changes are identified. There is normal alignment. No evidence for acute fracture or dislocation. Lung apices are normal in appearance.  IMPRESSION: 1. Postop changes. 2.  No evidence for acute cervical spine abnormality.   Electronically Signed   By: Nolon Nations M.D.   On: 01/18/2015 18:20     MDM   1. Cervical radicular pain    I suspect he has a pinched nerve. Will treat with 5 days of prednisone. Recommended regularly use of Aleve at home. He will use his OxyContin as needed. He has an appointment with Dr. Louanne Skye, on August 4. He is going to try and move that appointment to this week.    Melony Overly, MD 01/18/15 [phone]

## 2015-01-18 NOTE — ED Notes (Signed)
Pt states that he has had left neck to shoulder pain for 2 to 3 days

## 2015-01-18 NOTE — Discharge Instructions (Signed)
You likely have a pinched nerve in your neck. Take prednisone daily for 5 days. Use Aleve regularly for the next week. Use your OxyContin as needed. Please follow-up with Dr. Louanne Skye as soon as possible.

## 2015-01-22 IMAGING — CR DG CHEST 2V
2 series · 2 of 2 positions shown · non-contrast
Comparison: 02/06/2012

CLINICAL DATA: Chest pain and dizziness since last night

EXAM:
CHEST  2 VIEW

[w chest pa]
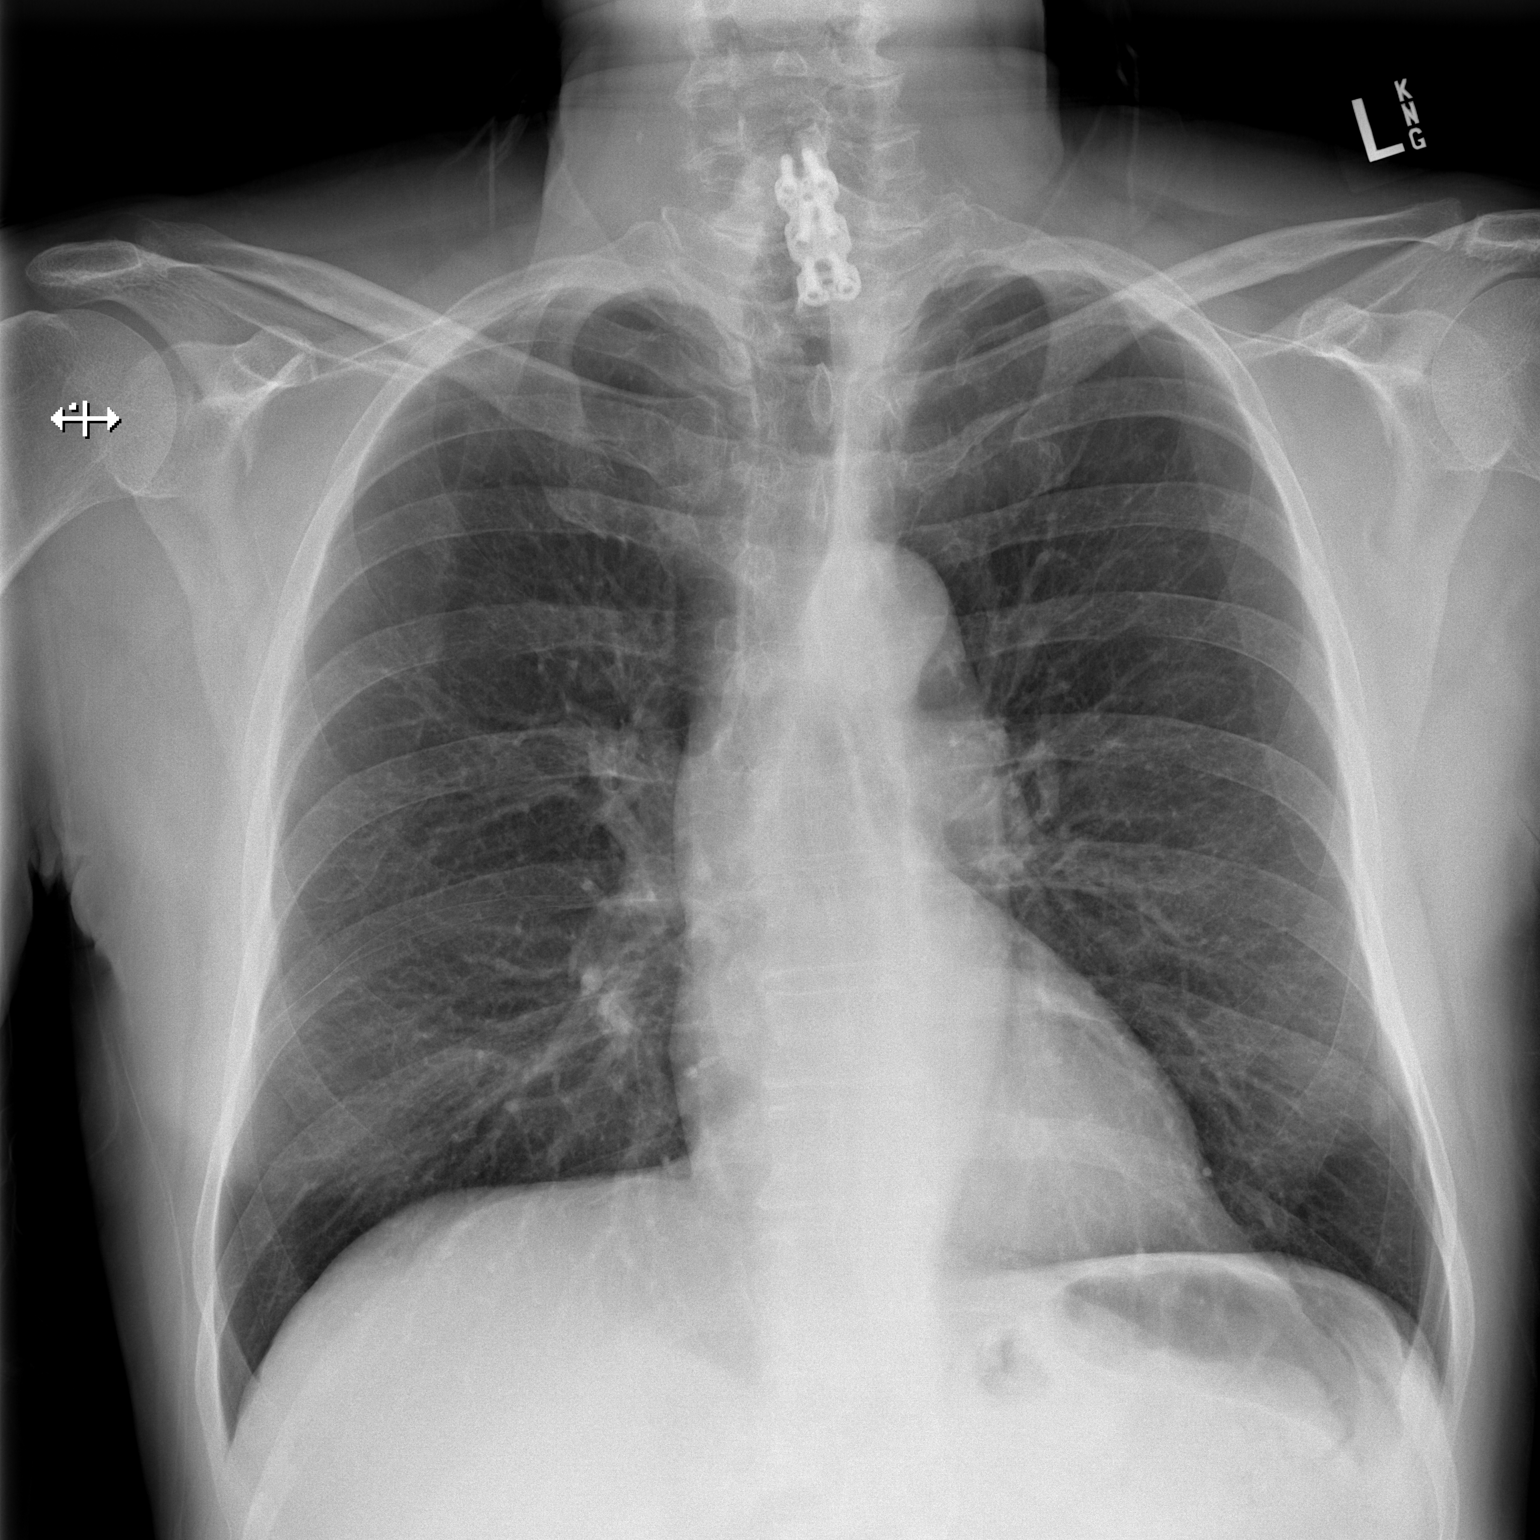

[w chest lat]
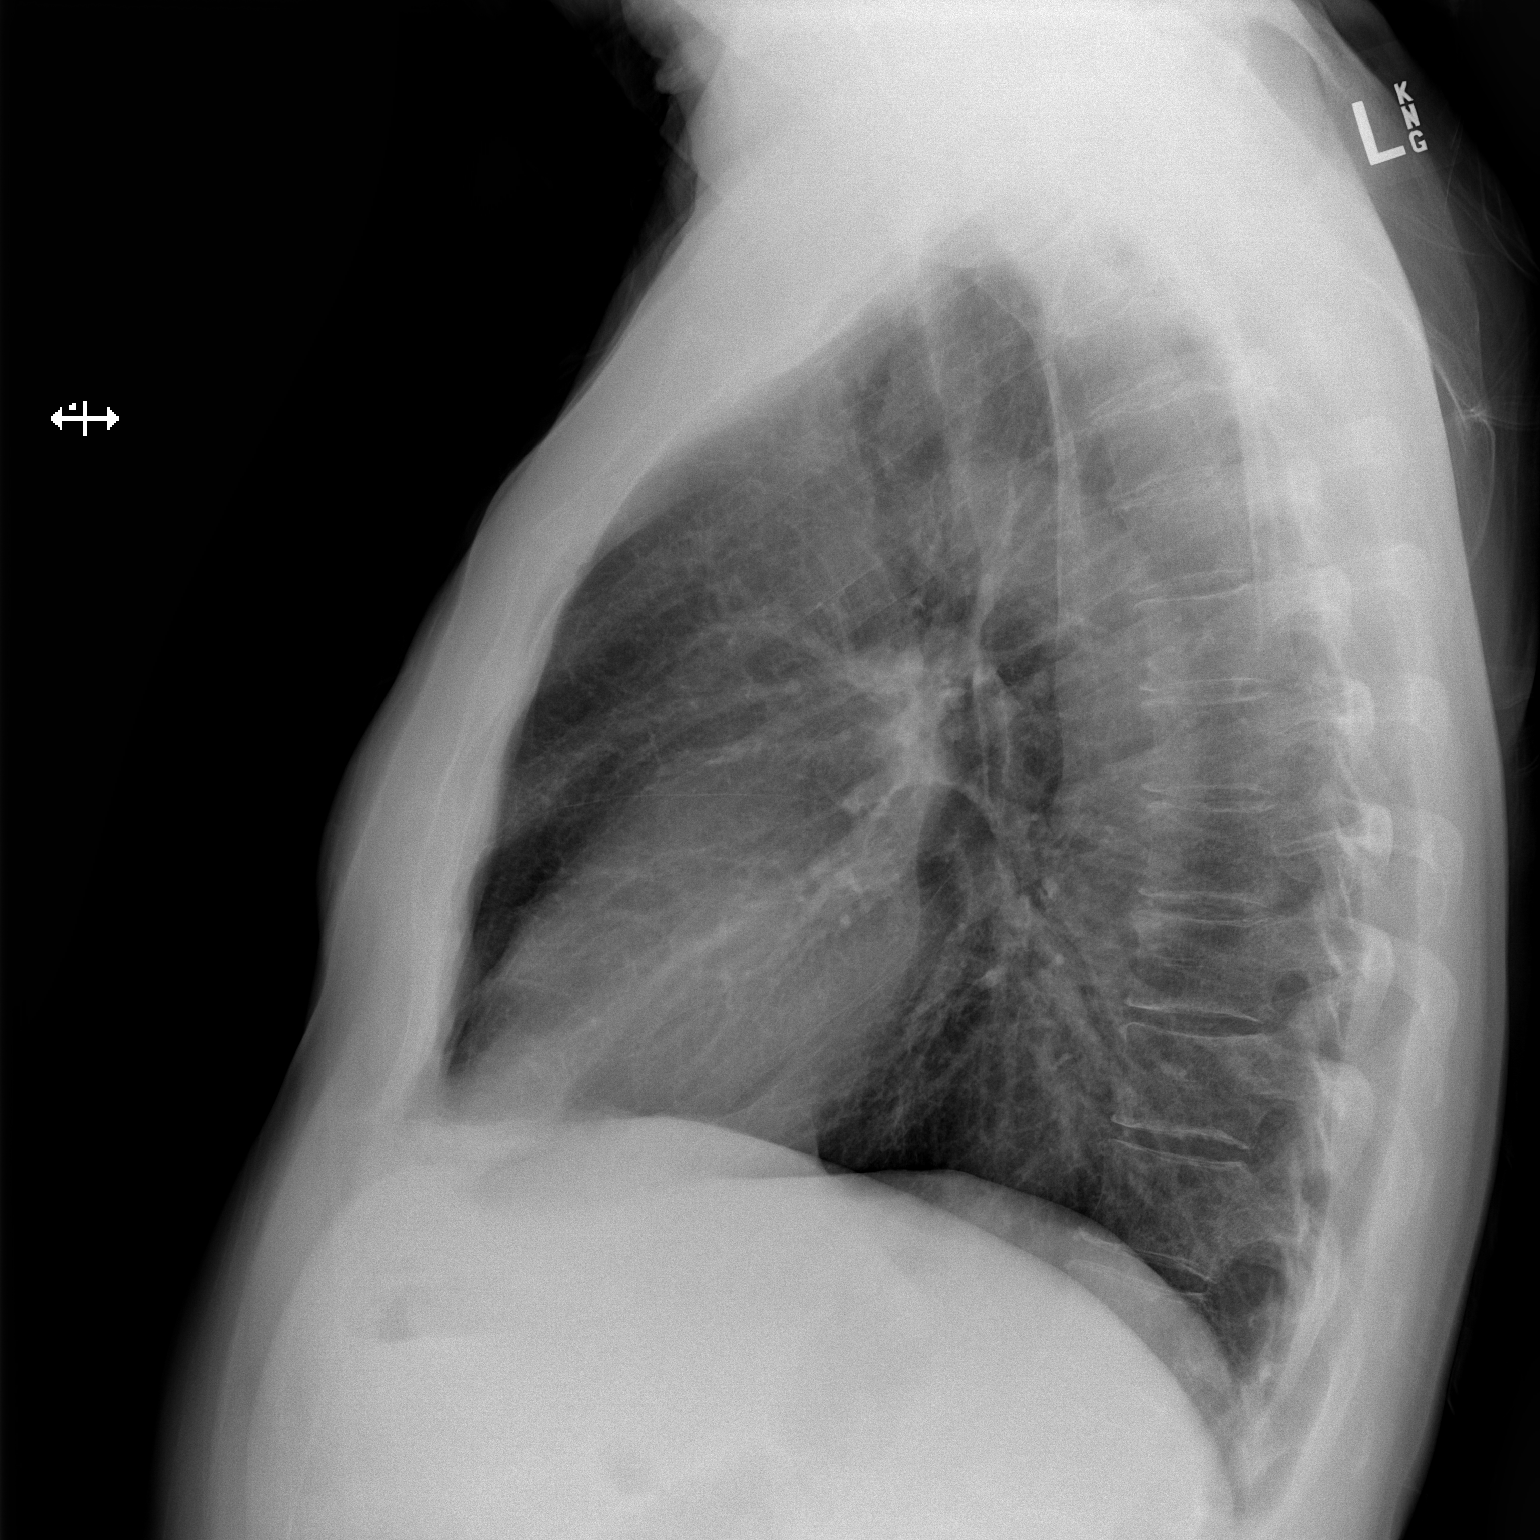

[2 of 2 positions shown; findings below may reference images not displayed]

FINDINGS: The heart size and mediastinal contours are within normal limits.
Both lungs are clear. The visualized skeletal structures are
unremarkable.
IMPRESSION: No active cardiopulmonary disease.

## 2015-01-22 IMAGING — CT CT ABD-PELV W/ CM
1 of 3 series · 14 of 32 positions shown, 19 images · IV contrast (omnipaque)
Comparison: None.

CLINICAL DATA: Epigastric pain.  Nausea and vomiting.

EXAM:
CT ABDOMEN AND PELVIS WITH CONTRAST
TECHNIQUE: Multidetector CT imaging of the abdomen and pelvis was performed
using the standard protocol following bolus administration of
intravenous contrast.
CONTRAST:  50mL OMNIPAQUE IOHEXOL 300 MG/ML SOLN, 100mL OMNIPAQUE
IOHEXOL 300 MG/ML SOLN

[Series 2: abd/pel with · axial · 0.77mm/px · z∈[-454,-44]mm · 14 of 92 slices shown, 19 images]
[im 5/92  soft-tissue]
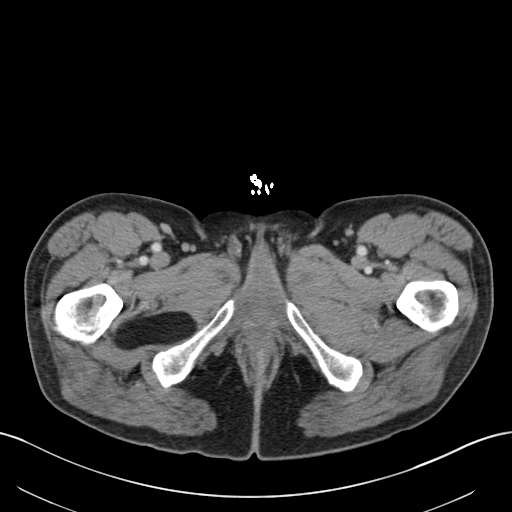
[im 5/92  bone]
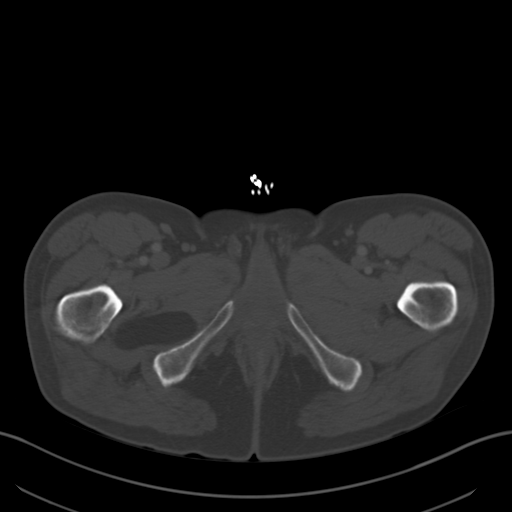
[im 15/92  soft-tissue]
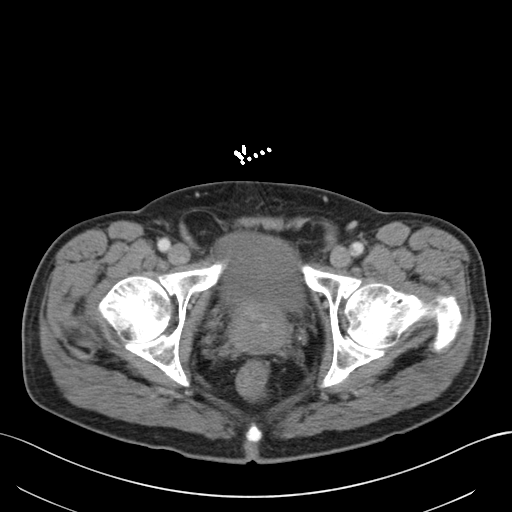
[im 20/92  soft-tissue]
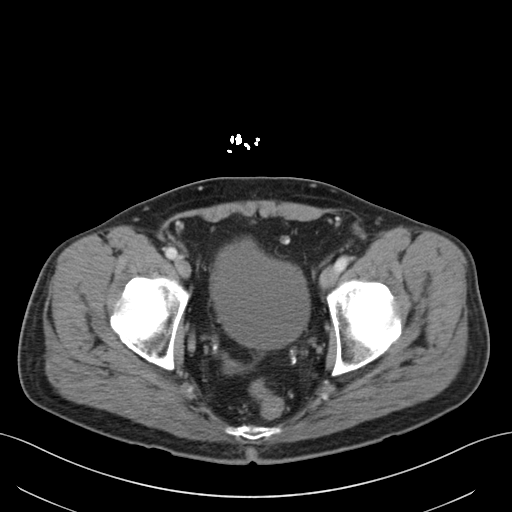
[im 24/92  soft-tissue]
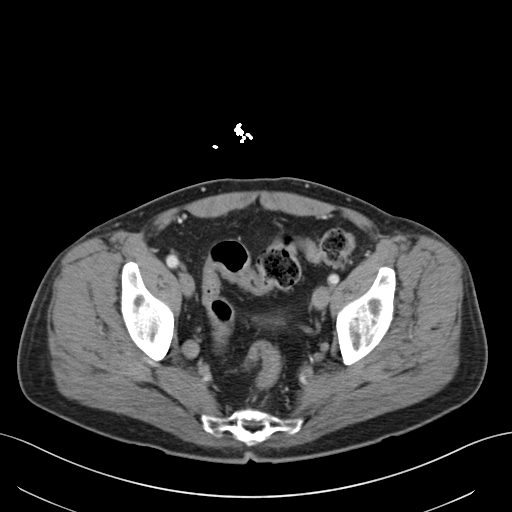
[im 34/92  soft-tissue]
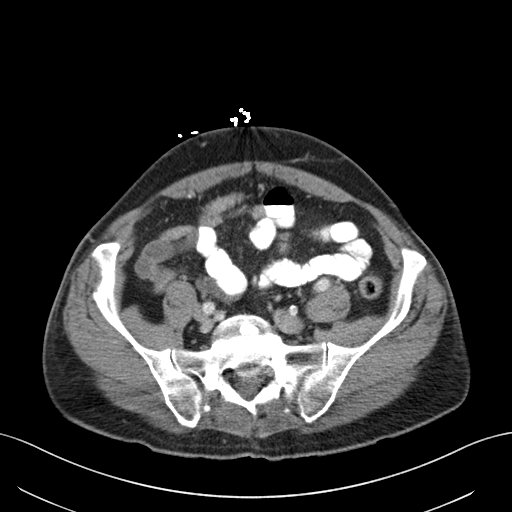
[im 39/92  soft-tissue]
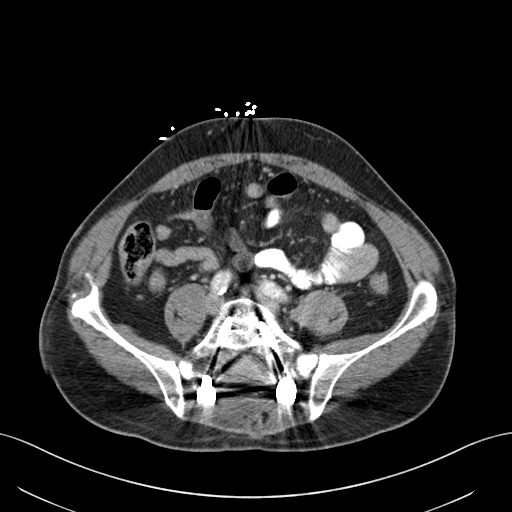
[im 48/92  soft-tissue]
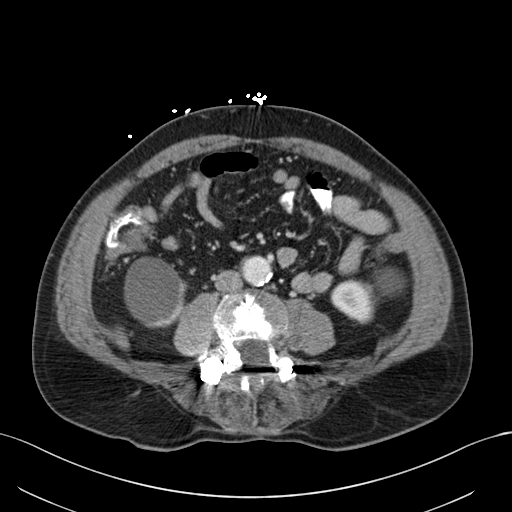
[im 53/92  soft-tissue]
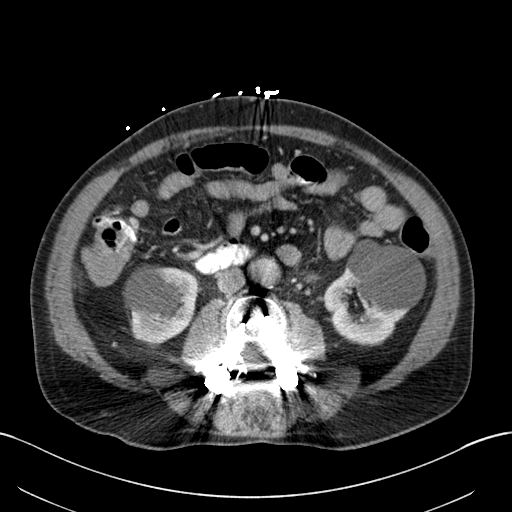
[im 58/92  soft-tissue]
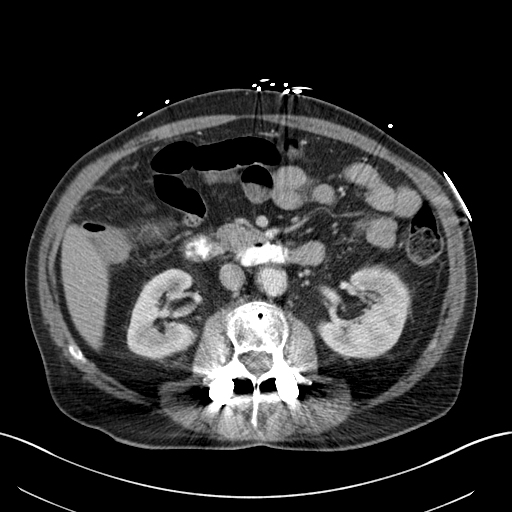
[im 58/92  bone]
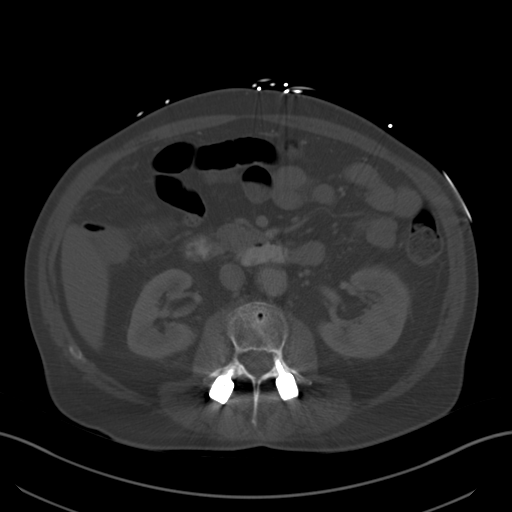
[im 68/92  soft-tissue]
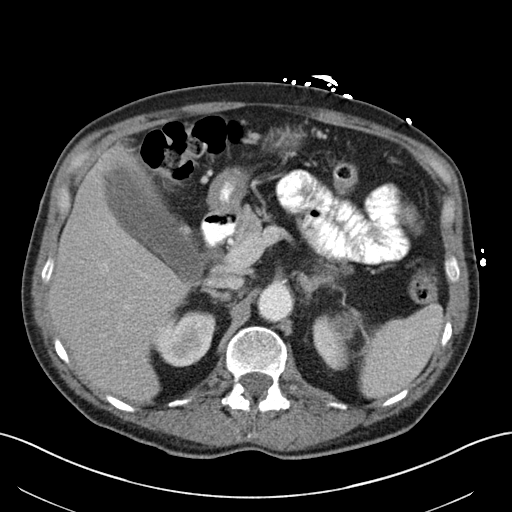
[im 72/92  soft-tissue]
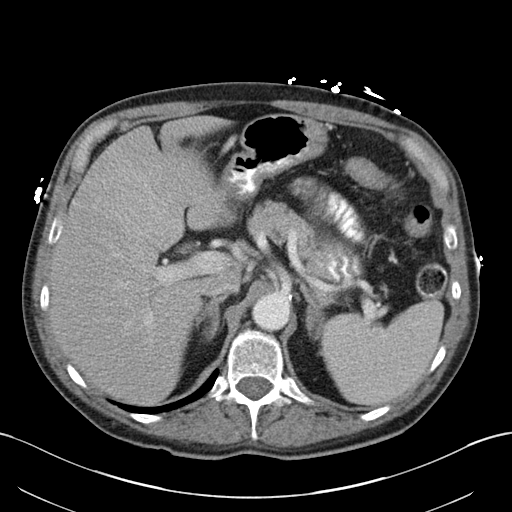
[im 72/92  lung]
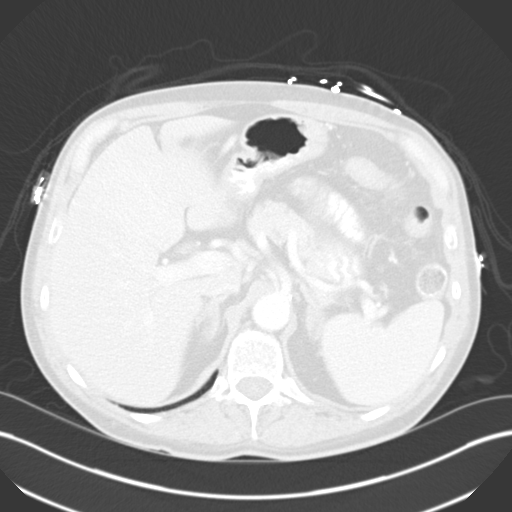
[im 77/92  soft-tissue]
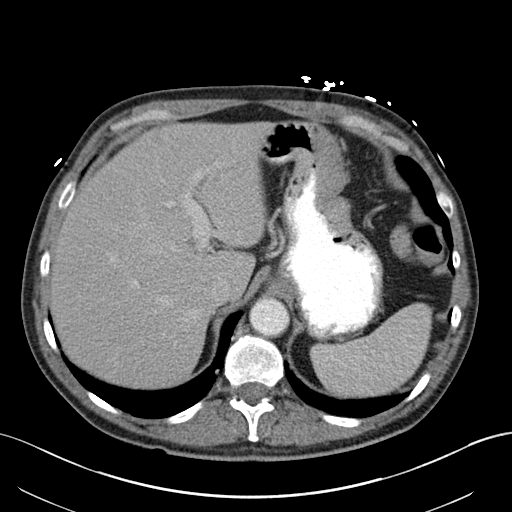
[im 77/92  lung]
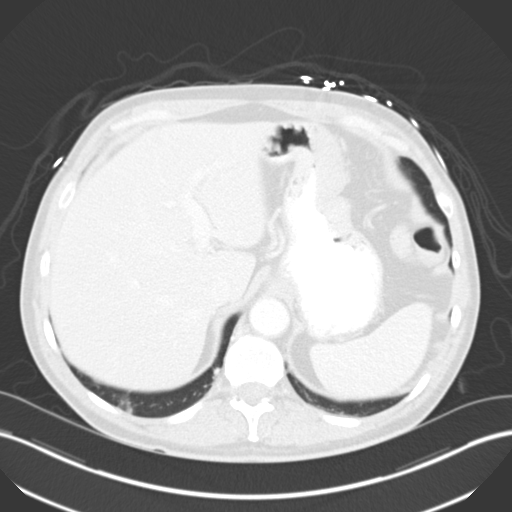
[im 82/92  lung]
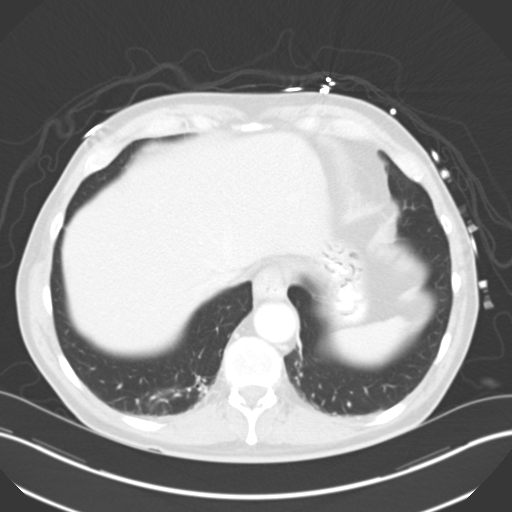
[im 87/92  soft-tissue]
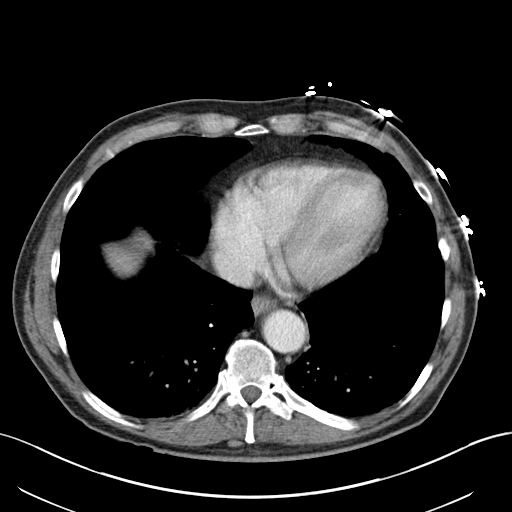
[im 87/92  lung]
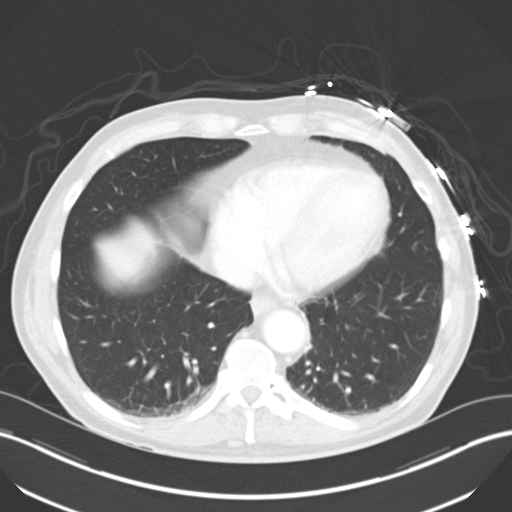

[14 of 32 positions shown; findings below may reference images not displayed]

FINDINGS: Mild dependent atelectasis is present bilaterally. The lungs are
otherwise clear. The heart size is normal. No significant pleural or
pericardial effusion is present.

A hyperdense lesion along the posterior dome of the left lobe of the
liver is not visualized on the delayed sequences. This likely
represents a hemangioma. The spleen is within normal limits. The
liver is otherwise unremarkable. The stomach, duodenum, and pancreas
are within normal limits. There is prominent wall nasal within the
gallbladder and some pericholecystic edema. The adrenal glands are
slightly thickened bilaterally without a discrete mass. Exophytic
cysts are present in both kidneys. No solid mass lesion is present.
The ureters are within normal limits bilaterally. The urinary
bladder is unremarkable.

The rectosigmoid colon is within normal limits. The remainder the
colon is unremarkable. The appendix is visualized and normal. Small
bowel is within normal limits. There is mild prominence of the
prostate gland extending into the base of the bladder.

Patient is status post lumbar fusion L2-S1. The hardware is intact.
No focal lytic or blastic lesions are present. Incidental note is
made of a benign appearing lab, within the obturator internus muscle
on the right.
IMPRESSION: 1. Inflammatory changes of the gallbladder suggest cholecystitis.
This could be confirmed with ultrasound if clinically indicated.
2. Bilateral exophytic renal cyst.
3. Postsurgical changes of the lumbar spine.
4. Hyperdense lesion over the dome of the liver likely represents a
hemangioma. This area could be further evaluated with ultrasound as
well.

## 2015-01-23 IMAGING — RF DG CHOLANGIOGRAM OPERATIVE
1 series · 8 of 8 positions shown · non-contrast
Comparison: Preoperative CT scan of the abdomen pelvis dated 12 July, 2013

CLINICAL DATA: Intraoperative cholangiogram.

EXAM:
INTRAOPERATIVE CHOLANGIOGRAM
TECHNIQUE: Cholangiographic images from the C-arm fluoroscopic device were
submitted for interpretation post-operatively. Please see the
procedural report for the amount of contrast and the fluoroscopy
time utilized.

[Series 1: run · 2 acquisitions, 8 frames shown]
[im 1/2]
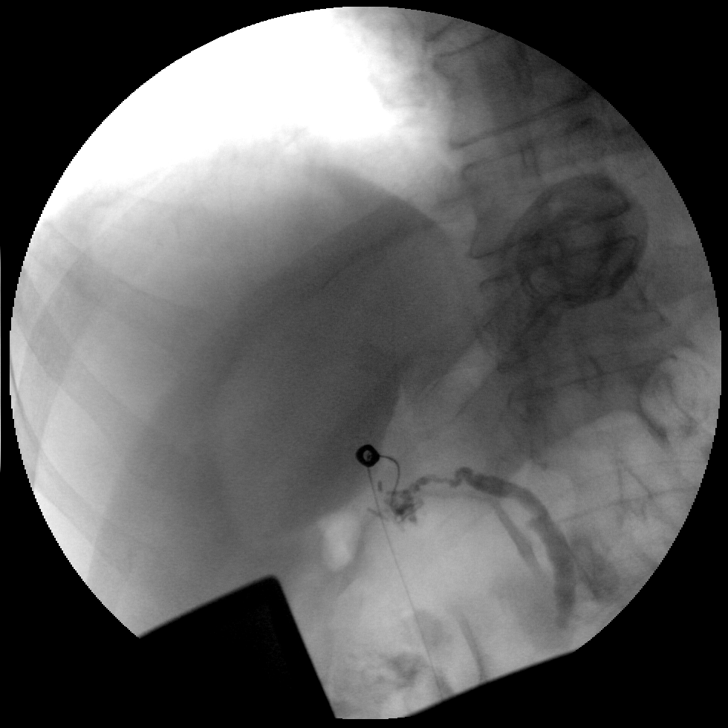
[im 1/2]
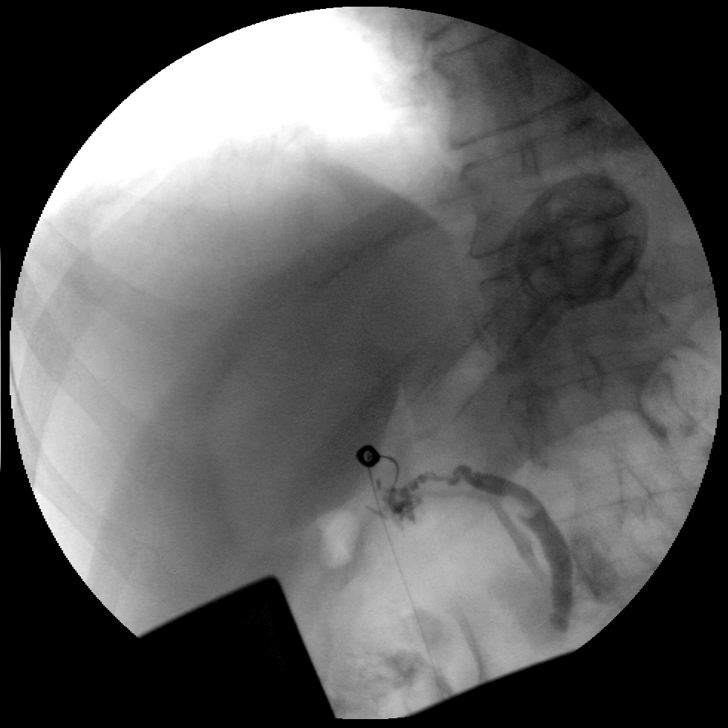
[im 1/2]
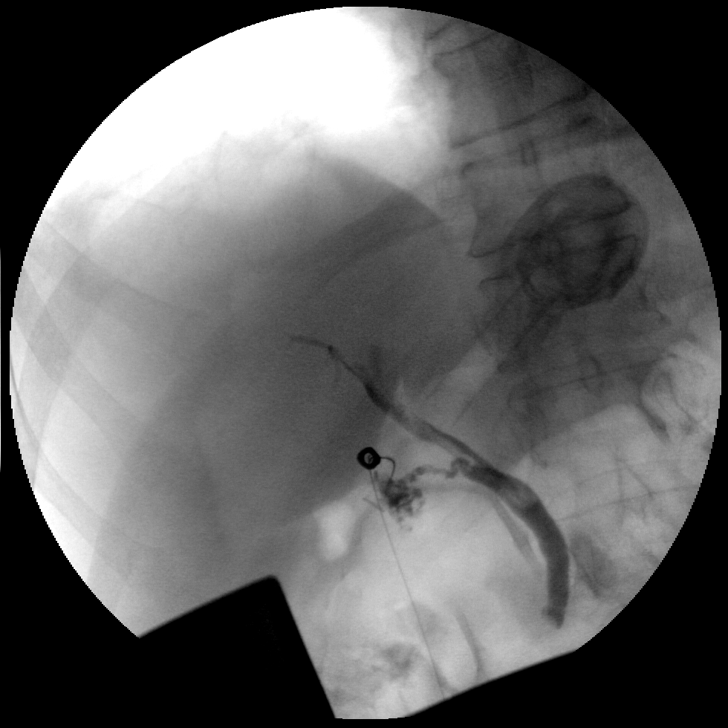
[im 1/2]
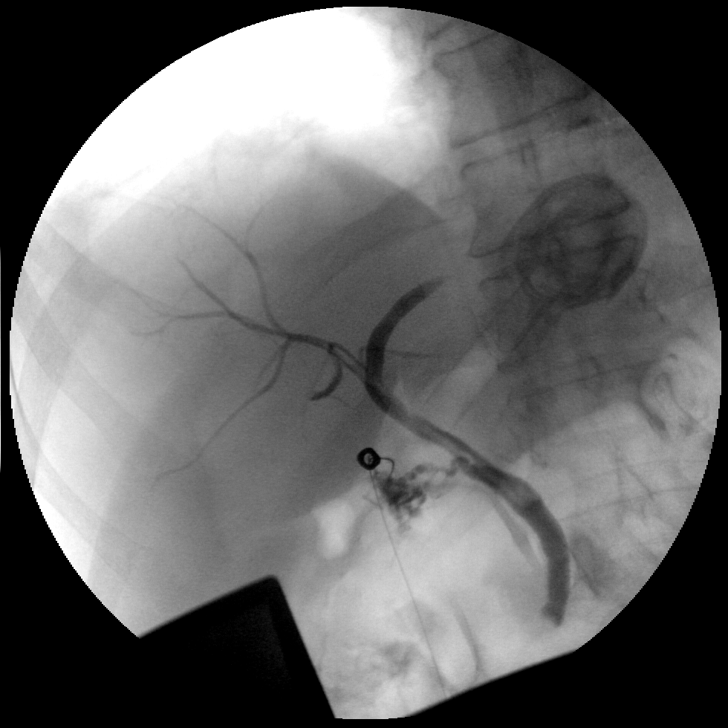
[im 2/2]
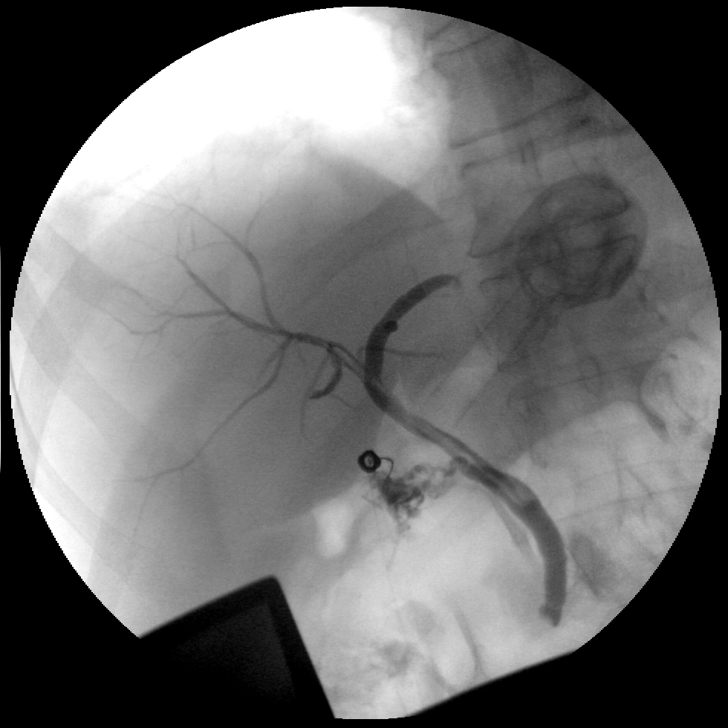
[im 2/2]
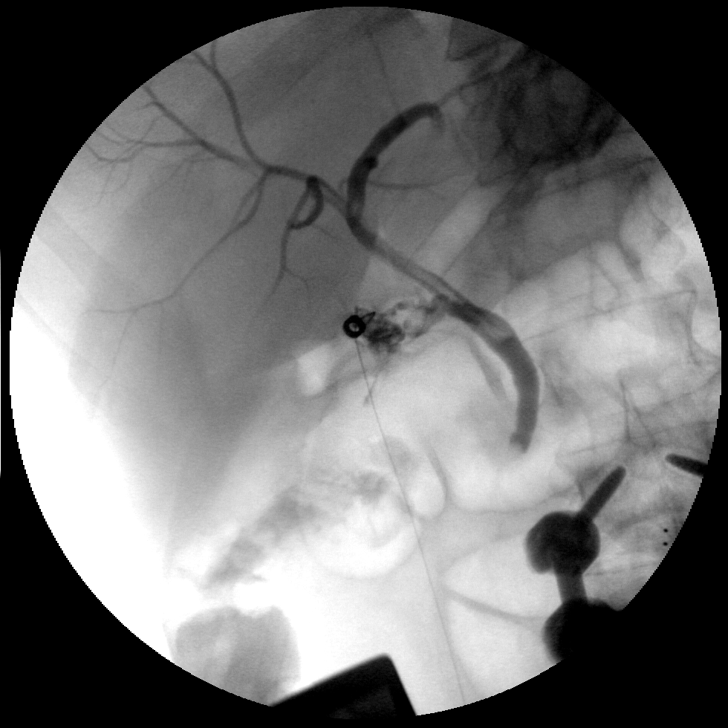
[im 2/2]
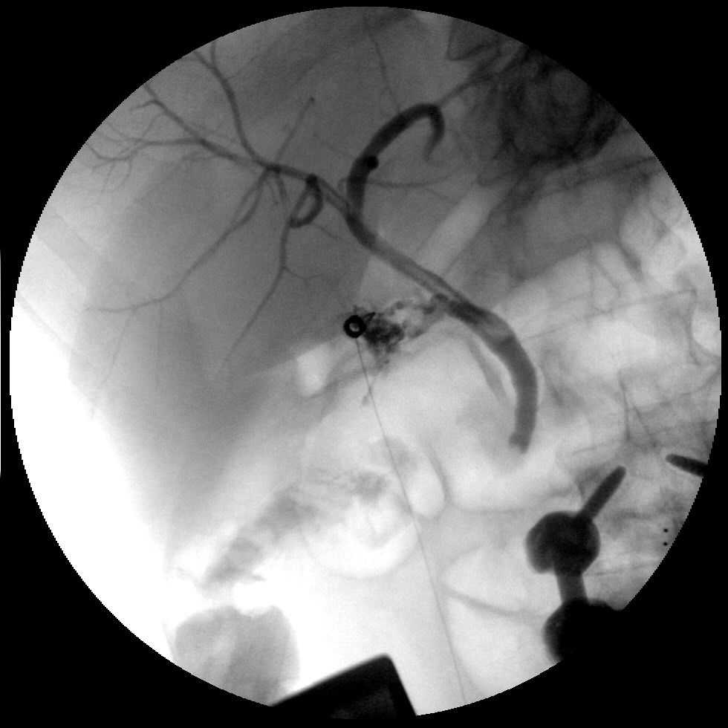
[im 2/2]
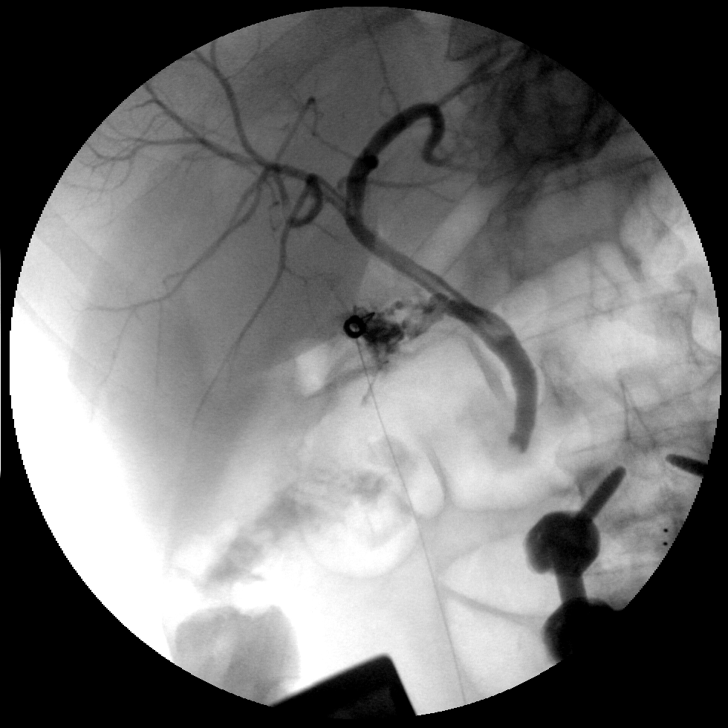

[8 of 8 positions shown; findings below may reference images not displayed]

FINDINGS: Contrast has filled normal-appearing intrahepatic ducts as well as
the and common hepatic duct and common bile duct. There is a long
segment filling defect within the and common hepatic duct and
proximal common bile duct. A discrete rounded stone is not
demonstrated. However, there is very little contrast entering the
duodenum. There is a convex border filling defect in the distal
aspect of the common bile duct which may reflect a retained stone.
IMPRESSION: 1. There is a long segment filling defect within the common hepatic
duct and proximal common bile duct. This may reflect sludge or blood
products.
2. Dilated convex bordered filling defect in the distal common bile
duct without allowing little drainage of contrast may reflect a
retained common bile duct stone.

## 2015-01-25 ENCOUNTER — Other Ambulatory Visit: Payer: Self-pay | Admitting: Specialist

## 2015-01-25 DIAGNOSIS — M542 Cervicalgia: Secondary | ICD-10-CM

## 2015-02-01 ENCOUNTER — Ambulatory Visit
Admission: RE | Admit: 2015-02-01 | Discharge: 2015-02-01 | Disposition: A | Discharge status: Home / Self Care | Payer: Medicare Other | Source: Ambulatory Visit | Attending: Specialist | Admitting: Specialist

## 2015-02-01 DIAGNOSIS — M542 Cervicalgia: Secondary | ICD-10-CM

## 2015-03-10 ENCOUNTER — Ambulatory Visit (INDEPENDENT_AMBULATORY_CARE_PROVIDER_SITE_OTHER): Payer: Medicare Other | Admitting: Endocrinology

## 2015-03-10 ENCOUNTER — Encounter: Payer: Self-pay | Admitting: Endocrinology

## 2015-03-10 VITALS — BP 137/87 | HR 62 | Temp 97.5°F | Ht 71.0 in | Wt 182.0 lb

## 2015-03-10 DIAGNOSIS — E119 Type 2 diabetes mellitus without complications: Secondary | ICD-10-CM | POA: Diagnosis not present

## 2015-03-10 DIAGNOSIS — E890 Postprocedural hypothyroidism: Secondary | ICD-10-CM | POA: Insufficient documentation

## 2015-03-10 DIAGNOSIS — Z125 Encounter for screening for malignant neoplasm of prostate: Secondary | ICD-10-CM

## 2015-03-10 DIAGNOSIS — Z0000 Encounter for general adult medical examination without abnormal findings: Secondary | ICD-10-CM

## 2015-03-10 DIAGNOSIS — I10 Essential (primary) hypertension: Secondary | ICD-10-CM

## 2015-03-10 LAB — URINALYSIS, ROUTINE W REFLEX MICROSCOPIC
BILIRUBIN URINE: NEGATIVE
Hgb urine dipstick: NEGATIVE
Ketones, ur: NEGATIVE
Leukocytes, UA: NEGATIVE
Nitrite: NEGATIVE
PH: 5.5 (ref 5.0–8.0)
SPECIFIC GRAVITY, URINE: 1.025 (ref 1.000–1.030)
TOTAL PROTEIN, URINE-UPE24: NEGATIVE
Urine Glucose: NEGATIVE
Urobilinogen, UA: 0.2 (ref 0.0–1.0)

## 2015-03-10 LAB — BASIC METABOLIC PANEL
BUN: 22 mg/dL (ref 6–23)
CHLORIDE: 104 meq/L (ref 96–112)
CO2: 25 mEq/L (ref 19–32)
Calcium: 9 mg/dL (ref 8.4–10.5)
Creatinine, Ser: 1.16 mg/dL (ref 0.40–1.50)
GFR: 67.6 mL/min (ref >60.00)
Glucose, Bld: 155 mg/dL — ABNORMAL HIGH (ref 70–99)
Potassium: 3.4 mEq/L — ABNORMAL LOW (ref 3.5–5.1)
Sodium: 142 mEq/L (ref 135–145)

## 2015-03-10 LAB — MICROALBUMIN / CREATININE URINE RATIO
Creatinine,U: 271.9 mg/dL
MICROALB/CREAT RATIO: 1 mg/g (ref 0.0–30.0)
Microalb, Ur: 2.6 mg/dL — ABNORMAL HIGH (ref 0.0–1.9)

## 2015-03-10 LAB — HEMOGLOBIN A1C: HEMOGLOBIN A1C: 6.5 % (ref 4.6–6.5)

## 2015-03-10 LAB — PSA: PSA: 1.59 ng/mL (ref 0.10–4.00)

## 2015-03-10 LAB — TSH: TSH: 1.72 u[IU]/mL (ref 0.35–4.50)

## 2015-03-10 NOTE — Patient Instructions (Addendum)
please consider these measures for your health:  minimize alcohol.  do not use tobacco products.  have a colonoscopy at least every 10 years from age 64.  keep firearms safely stored.  always use seat belts.  have working smoke alarms in your home.  see an eye doctor and dentist regularly.  never drive under the influence of alcohol or drugs (including prescription drugs).  those with fair skin should take precautions against the sun.  blood tests are requested for you today.  We'll let you know about the results.   Please come back for a follow-up appointment in 6 months.

## 2015-03-10 NOTE — Progress Notes (Signed)
Subjective:    Patient ID: Jeff Lopez, male    DOB: 10-15-1950, 64 y.o.   MRN: [id number]  HPI Pt is here for regular wellness examination, and is feeling pretty well in general, and says chronic med probs are stable, except as noted below Past Medical History  Diagnosis Date  . HYPERLIPIDEMIA 04/09/2007  . HYPERTENSION, BENIGN 04/19/2010  . GERD 04/09/2007  . RENAL CYST 05/27/2010  . OSTEOARTHRITIS, LUMBAR SPINE 04/09/2007  . LUMBAR DISC DISORDER 05/27/2010  . Headache(784.0)   . CAD, NATIVE VESSEL 04/19/2010    nonobstructive by cath 2007:  oLAD 20-30%, mLAD 50%, pCFX 20-30%, oAVCFX 20-30%, L renal art 50%;  normal LVF    Past Surgical History  Procedure Laterality Date  . Spine surgery  2006    C-spine surgery x 2  . Neck surgery    . Lumbar disc surgery  08/06/2012    L3 & L4  . Foot fracture surgery    . Lumbar laminectomy/decompression microdiscectomy Left 08/10/2012    Procedure: Dura Repair Left Side L2-L3;  Surgeon: [name], MD;  Location: [facility];  Service: Orthopedics;  Laterality: Left;  Wilson Frame, Sliding table, dura repair kit, microscope  . Cholecystectomy    . Cholecystectomy N/A 07/13/2013    Procedure: LAPAROSCOPIC CHOLECYSTECTOMY WITH INTRAOPERATIVE CHOLANGIOGRAM;  Surgeon: Shann Medal, MD;  Location: [facility];  Service: General;  Laterality: N/A;  . Ercp N/A 07/14/2013    Procedure: ENDOSCOPIC RETROGRADE CHOLANGIOPANCREATOGRAPHY (ERCP);  Surgeon: [name], MD;  Location: [facility];  Service: Endoscopy;  Laterality: N/A;  . Cardiac catheterization  2007    Dr Loanne Drilling  . Fracture surgery    . Back surgery  2012,2014  . Lumbar disc surgery  11/11/2015    L1 L2    DR NITKA  . Lumbar laminectomy N/A 11/10/2013    Procedure: Left L1-2 far lateral approach to excise herniated nucleus pulposus;  Surgeon: [name], MD;  Location: [facility];  Service: Orthopedics;  Laterality: N/A;    Social History   Social History  . Marital Status:  Single    Spouse Name: N/A  . Number of Children: 1  . Years of Education: N/A   Occupational History  . [employer]    Social History Main Topics  . Smoking status: Former Smoker -- 0.50 packs/day for 44 years    Types: Cigarettes    Quit date: 04/06/2012  . Smokeless tobacco: Never Used     Comment: pt has stopped smoking about 9 months now  . Alcohol Use: No  . Drug Use: No  . Sexual Activity: Not Currently   Other Topics Concern  . Not on file   Social History Narrative   Works in Psychologist, educational. Retired.   Lives alone, has one child in [place].     Current Outpatient Prescriptions on File Prior to Visit  Medication Sig Dispense Refill  . levothyroxine (SYNTHROID, LEVOTHROID) 100 MCG tablet Take 1 tablet (100 mcg total) by mouth daily. 90 tablet 3  . losartan-hydrochlorothiazide (HYZAAR) 100-12.5 MG per tablet take 1 tablet by mouth once daily 30 tablet 5  . metoprolol succinate (TOPROL-XL) 25 MG 24 hr tablet take 1 tablet by mouth once daily 30 tablet 5  . metoprolol tartrate (LOPRESSOR) 25 MG tablet Take 25 mg by mouth 2 (two) times daily.    . pantoprazole (PROTONIX) 40 MG tablet Take 40 mg by mouth 2 (two) times daily. Before meals  No current facility-administered medications on file prior to visit.    Allergies  Allergen Reactions  . Ceftin Other (See Comments)    Patient stated it caused "sores in mouth"    Family History  Problem Relation Age of Onset  . Cancer Mother     Breast Cancer, Uterine Cancer  . Breast cancer Mother   . Uterine cancer Mother   . Heart disease Mother   . Heart attack Mother   . Hypertension Brother   . Thyroid disease Neg Hx     BP 137/87 mmHg  Pulse 62  Temp(Src) 97.5 F (36.4 C) (Oral)  Ht _0  (1.803 m)  Wt 182 lb (82.555 kg)  BMI 25.40 kg/m2  SpO2 97%   Review of Systems  Constitutional: Negative for fever.  HENT: Negative for hearing loss.   Eyes: Negative for visual disturbance.  Respiratory:  Negative for shortness of breath.   Cardiovascular: Negative for chest pain.  Gastrointestinal: Negative for blood in stool.  Endocrine: Negative for cold intolerance.  Genitourinary: Negative for hematuria.  Musculoskeletal: Negative for gait problem.  Allergic/Immunologic: Negative for environmental allergies.  Neurological: Negative for syncope.  Hematological: Bruises/bleeds easily.  Psychiatric/Behavioral: Negative for dysphoric mood.       Objective:   Physical Exam VS: see vs page GEN: no distress HEAD: head: no deformity eyes: no periorbital swelling, no proptosis external nose and ears are normal mouth: no lesion seen NECK: supple, thyroid is not enlarged CHEST WALL: no deformity LUNGS: clear to auscultation BREASTS:  No gynecomastia CV: reg rate and rhythm, no murmur ABD: abdomen is soft, nontender.  no hepatosplenomegaly.  not distended.  no hernia RECTAL: normal external and internal exam.  heme neg. PROSTATE:  Normal size.  No nodule. MUSCULOSKELETAL: muscle bulk and strength are grossly normal.  no obvious joint swelling.  gait is normal and steady EXTEMITIES: no deformity.  no ulcer on the feet.  feet are of normal color and temp.  no edema PULSES: no carotid bruit NEURO:  cn 2-12 grossly intact.   readily moves all 4's.  sensation is intact to touch on the feet SKIN:  Normal texture and temperature.  No rash or suspicious lesion is visible.   NODES:  None palpable at the neck PSYCH: alert, well-oriented.  Does not appear anxious nor depressed.       Assessment & Plan:  Wellness visit today, with problems stable, except as noted.    SEPARATE EVALUATION FOLLOWS--EACH PROBLEM HERE IS NEW, NOT RESPONDING TO TREATMENT, OR POSES SIGNIFICANT RISK TO [name] HEALTH: HISTORY OF THE PRESENT ILLNESS: Pt states few weeks of moderate cramps in the legs, and assoc numbness.   PAST MEDICAL HISTORY reviewed and up to date today REVIEW OF SYSTEMS: He has weight  gain PHYSICAL EXAMINATION: VITAL SIGNS:  See vs page GENERAL: no distress Pulses: dorsalis pedis intact bilat.   LAB/XRAY RESULTS: Lab Results  Component Value Date   CALCIUM 9.0 03/10/2015   CAION 1.21 07/12/2013   Lab Results  Component Value Date   CREATININE 1.16 03/10/2015   BUN 22 03/10/2015   NA 142 03/10/2015   K 3.4* 03/10/2015   CL 104 03/10/2015   CO2 25 03/10/2015  IMPRESSION: Cramps, possible due to hypokalemia PLAN: i have sent a prescription to your pharmacy, for [facility]

## 2015-03-11 ENCOUNTER — Telehealth: Payer: Self-pay | Admitting: Endocrinology

## 2015-03-11 MED ORDER — POTASSIUM CHLORIDE ER 10 MEQ PO TBCR: 10.0000 meq | EXTENDED_RELEASE_TABLET | Freq: Every day | ORAL | Status: DC

## 2015-03-11 NOTE — Telephone Encounter (Signed)
please call patient: Reviewing your test results again, your potassium is slightly low. This could be causing your cramps. i have sent a prescription to your pharmacy, for the potassium.  i hope this helps.

## 2015-03-12 NOTE — Telephone Encounter (Signed)
Pt advised of note below and voiced understanding.  

## 2015-03-18 ENCOUNTER — Ambulatory Visit: Payer: Medicare Other | Admitting: Cardiovascular Disease

## 2015-04-08 ENCOUNTER — Other Ambulatory Visit (HOSPITAL_COMMUNITY): Payer: Self-pay | Admitting: Specialist

## 2015-04-13 NOTE — Pre-Procedure Instructions (Signed)
   FINNICK OROSZ  04/13/2015     Your procedure is scheduled on : Tuesday April 20, 2015 at 7:30 AM.  Report to [facility] Admitting at 5:30 A.M.  Call this number if you have problems the morning of surgery: [phone]    Remember:  Do not eat food or drink liquids after midnight.  Take these medicines the morning of surgery with A SIP OF WATER : Levothyroxine (Synthroid), Metoprolol (Toprol XL), Oxycodone if needed, Pantoprazole (Protonix)   Stop taking any vitamins, herbal medications, Ibuprofen, Advil, Motrin, Aleve, etc    Do not wear jewelry.  Do not wear lotions, powders, or cologne.    Men may shave face and neck.  Do not bring valuables to the hospital.  [facility] is not responsible for any belongings or valuables.  Contacts, dentures or bridgework may not be worn into surgery.  Leave your suitcase in the car.  After surgery it may be brought to your room.  For patients admitted to the hospital, discharge time will be determined by your treatment team.  Patients discharged the day of surgery will not be allowed to drive home.   Name and phone number of your driver:    Special instructions:  Shower using CHG soap the night before and the morning of your surgery  Please read over the following fact sheets that you were given. Pain Booklet, Coughing and Deep Breathing, Blood Transfusion Information, MRSA Information and Surgical Site Infection Prevention

## 2015-04-14 ENCOUNTER — Encounter (HOSPITAL_COMMUNITY): Payer: Self-pay

## 2015-04-14 ENCOUNTER — Encounter (HOSPITAL_COMMUNITY)
Admission: RE | Admit: 2015-04-14 | Discharge: 2015-04-14 | Disposition: A | Discharge status: Home / Self Care | Payer: Medicare Other | Source: Ambulatory Visit | Attending: Specialist | Admitting: Specialist

## 2015-04-14 DIAGNOSIS — M4316 Spondylolisthesis, lumbar region: Secondary | ICD-10-CM | POA: Insufficient documentation

## 2015-04-14 DIAGNOSIS — M5136 Other intervertebral disc degeneration, lumbar region: Secondary | ICD-10-CM | POA: Insufficient documentation

## 2015-04-14 DIAGNOSIS — Z0183 Encounter for blood typing: Secondary | ICD-10-CM | POA: Diagnosis not present

## 2015-04-14 DIAGNOSIS — E785 Hyperlipidemia, unspecified: Secondary | ICD-10-CM | POA: Insufficient documentation

## 2015-04-14 DIAGNOSIS — Z87891 Personal history of nicotine dependence: Secondary | ICD-10-CM | POA: Insufficient documentation

## 2015-04-14 DIAGNOSIS — Z01812 Encounter for preprocedural laboratory examination: Secondary | ICD-10-CM | POA: Diagnosis not present

## 2015-04-14 DIAGNOSIS — Z01818 Encounter for other preprocedural examination: Secondary | ICD-10-CM | POA: Diagnosis present

## 2015-04-14 DIAGNOSIS — Z79899 Other long term (current) drug therapy: Secondary | ICD-10-CM | POA: Insufficient documentation

## 2015-04-14 DIAGNOSIS — I2510 Atherosclerotic heart disease of native coronary artery without angina pectoris: Secondary | ICD-10-CM | POA: Diagnosis not present

## 2015-04-14 DIAGNOSIS — I10 Essential (primary) hypertension: Secondary | ICD-10-CM | POA: Insufficient documentation

## 2015-04-14 HISTORY — DX: Hypothyroidism, unspecified: E03.9

## 2015-04-14 HISTORY — DX: Other injury of unspecified body region, initial encounter: T14.8XXA

## 2015-04-14 HISTORY — DX: Other reaction to spinal and lumbar puncture: G97.1

## 2015-04-14 LAB — TYPE AND SCREEN
ABO/RH(D): O POS
ANTIBODY SCREEN: NEGATIVE

## 2015-04-14 LAB — COMPREHENSIVE METABOLIC PANEL
ALT: 20 U/L (ref 17–63)
AST: 24 U/L (ref 15–41)
Albumin: 3.9 g/dL (ref 3.5–5.0)
Alkaline Phosphatase: 77 U/L (ref 38–126)
Anion gap: 13 (ref 5–15)
BUN: 16 mg/dL (ref 6–20)
CHLORIDE: 103 mmol/L (ref 101–111)
CO2: 25 mmol/L (ref 22–32)
Calcium: 8.8 mg/dL — ABNORMAL LOW (ref 8.9–10.3)
Creatinine, Ser: 1.24 mg/dL (ref 0.61–1.24)
Glucose, Bld: 155 mg/dL — ABNORMAL HIGH (ref 65–99)
POTASSIUM: 2.4 mmol/L — AB (ref 3.5–5.1)
SODIUM: 141 mmol/L (ref 135–145)
Total Bilirubin: 2.3 mg/dL — ABNORMAL HIGH (ref 0.3–1.2)
Total Protein: 6.5 g/dL (ref 6.5–8.1)

## 2015-04-14 LAB — CBC
HCT: 42.8 % (ref 39.0–52.0)
Hemoglobin: 15.1 g/dL (ref 13.0–17.0)
MCH: 32.3 pg (ref 26.0–34.0)
MCHC: 35.3 g/dL (ref 30.0–36.0)
MCV: 91.6 fL (ref 78.0–100.0)
Platelets: 206 10*3/uL (ref 150–400)
RBC: 4.67 MIL/uL (ref 4.22–5.81)
RDW: 12.7 % (ref 11.5–15.5)
WBC: 6 10*3/uL (ref 4.0–10.5)

## 2015-04-14 LAB — SURGICAL PCR SCREEN
MRSA, PCR: NEGATIVE
Staphylococcus aureus: NEGATIVE

## 2015-04-14 NOTE — Progress Notes (Signed)
Nurse called Dr. Otho Ket office and spoke with Triage Nurse and informed her that patients potassium is 2.4. Triage Nurse stated she would get in touch with Dr. Louanne Skye and have him address it.

## 2015-04-14 NOTE — Progress Notes (Signed)
PCP is Renato Shin  Cardiologist is Dr. Burt Knack  Patient denied having any acute cardiac or pulmonary issues  Stress test in EPIC on 12/21/14  Patient informed Nurse that he started taking Gabapentin and Unisom on Monday night and that those medications has caused his stomach to be upset. Patient unsure if it was the gapabentin or unisom that was causing his upset stomach

## 2015-04-15 ENCOUNTER — Encounter (HOSPITAL_COMMUNITY): Payer: Self-pay

## 2015-04-15 NOTE — Progress Notes (Signed)
Anesthesia Chart Review:  Pt is 64 year old male scheduled for T12 to L 2 fusion, R transforaminal lumbar interbody fusion, posterior fusion T12 to L2 with hooks at T12 and L1, narrow pedicles L1 bilateral, allograft, local bone graft on 04/20/2015 with Dr. Louanne Skye.   PCP is Dr. Renato Shin. Cardiologist is Dr. Sherren Mocha, last office visit 06/25/14.   PMH includes: CAD (nonobstructive by cath 2007), HTN, hyperlipidemia. Former smoker. BMI 25. S/p L1-2 excision herniated nucleus pulposus 11/10/13. S/p lap cholecystectomy 07/14/13. S/p L2-3 dura repair 08/10/12. S/p posterior lumbar fusion 08/06/12.   Medications include: levothyroxine, losartan-hctz, metoprolol, protonix.   Preoperative labs reviewed.  K 2.4. Triage RN at Dr. Otho Ket office notified by PAT RN. Dr. Louanne Skye ordered for BMET to be rechecked DOS.   EKG 06/25/2014: sinus bradycardia with occasional PVCs. Nonspecific ST changes.   Nuclear stress test 12/21/2014:   Nuclear stress EF: 47%.  There was no ST segment deviation noted during stress.  The study is normal.  This is a low risk study.  The left ventricular ejection fraction is mildly decreased (45-54%).  Carotid duplex US 07/09/2012: O-39% B ICA stenosis  Cardiac cath 11/24/2005: 1. Well-preserved left ventricular function. 2. Approximate 50% segmental plaque at the mid LAD artery. 3. Scattered irregularities in the circumflex as described above. 4. A 50% left renal artery stenosis.  Pt has cardiac clearance for surgery from Dr. Burt Knack on paper chart.   If labs acceptable DOS, I anticipate pt can proceed as scheduled.   Willeen Cass, FNP-BC [facility]/Anesthesiology Phone: [phone] 04/15/2015 3:52 PM

## 2015-04-19 MED ORDER — CHLORHEXIDINE GLUCONATE 4 % EX LIQD: 60.0000 mL | Freq: Once | CUTANEOUS | Status: DC

## 2015-04-19 MED ORDER — CEFAZOLIN SODIUM-DEXTROSE 2-3 GM-% IV SOLR
2.0000 g | INTRAVENOUS | Status: AC
  Administered 2015-04-20 (×2): 2 g via INTRAVENOUS
  Filled 2015-04-19: qty 50

## 2015-04-19 NOTE — H&P (Signed)
Jeff Lopez is an 64 y.o. male.   Chief Complaint:  Chronic back pain HPI: patient presents to the hospital for surgery for the above complaint.  Failed conservative treatment.    Past Medical History  Diagnosis Date  . HYPERLIPIDEMIA 04/09/2007  . HYPERTENSION, BENIGN 04/19/2010  . GERD 04/09/2007  . RENAL CYST 05/27/2010  . OSTEOARTHRITIS, LUMBAR SPINE 04/09/2007  . LUMBAR DISC DISORDER 05/27/2010  . Headache(784.0)   . CAD, NATIVE VESSEL 04/19/2010    nonobstructive by cath 2007:  oLAD 20-30%, mLAD 50%, pCFX 20-30%, oAVCFX 20-30%, L renal art 50%;  normal LVF  . Nerve damage     Left leg  . Spinal headache     After having back surgery in 2014  . Hypothyroidism   . Hypothyroidism     previous hyperthyroidism, s/p I-131    Past Surgical History  Procedure Laterality Date  . Spine surgery  2006    C-spine surgery x 2  . Neck surgery      X 2  . Lumbar disc surgery  08/06/2012    L3 & L4  . Foot fracture surgery    . Lumbar laminectomy/decompression microdiscectomy Left 08/10/2012    Procedure: Dura Repair Left Side L2-L3;  Surgeon: [name], MD;  Location: [place] [facility];  Service: Orthopedics;  Laterality: Left;  Wilson Frame, Sliding table, dura repair kit, microscope  . Ercp N/A 07/14/2013    Procedure: ENDOSCOPIC RETROGRADE CHOLANGIOPANCREATOGRAPHY (ERCP);  Surgeon: [name], MD;  Location: [facility];  Service: Endoscopy;  Laterality: N/A;  . Cardiac catheterization  2007    Dr Loanne Drilling  . Fracture surgery    . Back surgery  2012,2014  . Lumbar disc surgery  11/11/2015    L1 L2    DR NITKA  . Lumbar laminectomy N/A 11/10/2013    Procedure: Left L1-2 far lateral approach to excise herniated nucleus pulposus;  Surgeon: [name], MD;  Location: [place];  Service: Orthopedics;  Laterality: N/A;  . Colonoscopy w/ polypectomy    . Cholecystectomy    . Cholecystectomy N/A 07/13/2013    Procedure: LAPAROSCOPIC CHOLECYSTECTOMY WITH INTRAOPERATIVE CHOLANGIOGRAM;   Surgeon: Shann Medal, MD;  Location: [facility];  Service: General;  Laterality: N/A;  . Eye surgery Bilateral     Cataract removal    Family History  Problem Relation Age of Onset  . Cancer Mother     Breast Cancer, Uterine Cancer  . Breast cancer Mother   . Uterine cancer Mother   . Heart disease Mother   . Heart attack Mother   . Hypertension Brother   . Thyroid disease Neg Hx    Social History:  reports that he quit smoking about 3 years ago. His smoking use included Cigarettes. He has a 22 pack-year smoking history. He has never used smokeless tobacco. He reports that he does not drink alcohol or use illicit drugs.  Allergies:  Allergies  Allergen Reactions  . Ceftin Other (See Comments)    Patient stated it caused "sores in mouth"  . Gabapentin     Causes upset stomach; pt unsure if Gabapentin or Unisom causes upset stomach    No prescriptions prior to admission    No results found for this or any previous visit (from the past 74 hour(s)). No results found.  Review of Systems  Constitutional: Negative.   HENT: Negative.   Respiratory: Negative.   Cardiovascular: Negative.   Gastrointestinal: Negative.   Genitourinary: Negative.   Musculoskeletal:  Positive for back pain.  Skin: Negative.   Psychiatric/Behavioral: Negative.     There were no vitals taken for this visit. Physical Exam  Constitutional: He is oriented to person, place, and time. No distress.  HENT:  Head: Atraumatic.  Eyes: EOM are normal.  Neck: Normal range of motion.  Cardiovascular: Normal rate.   Respiratory: No respiratory distress.  GI: He exhibits distension.  Musculoskeletal: He exhibits tenderness.  Neurological: He is alert and oriented to person, place, and time.  Skin: Skin is warm and dry.  Psychiatric: He has a normal mood and affect.   MRI report: COMPARISON: Intraoperative localization 11/10/2013. MRI of lumbar spine 09/25/2013.  FINDINGS: Segmentation: The numbering  convention used for this exam termed L5-S1 as the last intervertebral disc space.  Alignment: New retrolisthesis of L1 on L 2 measuring 4 mm. This is associated with progressive collapse of the disc space. Some instability was probably introduced with the recent operation given the localization films 11/10/2013. Alignment at the PLIF levels from L2 through the S1 is unchanged.  Vertebrae: Degenerative endplate changes are developing at L1-L 2. Chronic degenerative endplate changes at F5-N5. No aggressive osseous lesions. Negative for compression fracture.  Conus medullaris: Normal termination at T12-L1.  Paraspinal tissues: RIGHT renal cysts. There is probably also a LEFT renal cysts present, partially visible. No change compared to CT 07/12/2013 in the visible portions.  Disc levels:  Disc Signal: Progressive disc degeneration at L1-L2. Discectomy from L2 through S1.  Lower thoracic levels show mild disc bulging but no stenosis.  L1-L2: Mild central stenosis associated with retrolisthesis, posterior disc bulging and posterior ligamentum flavum redundancy. Interval LEFT foraminal and lateral partial discectomy. Enhancing fibrosis is present in the LEFT foraminal and lateral disc space, best seen on the sagittal images. No convincing evidence of recurrent protrusion. Mild LEFT foraminal stenosis associated with collapse of the disc and retrolisthesis. Suspect partial facetectomy with postsurgical changes in the LEFT paraspinal musculature.  L2-L3: Wide posterior decompression. No recurrent stenosis.  L3-L4: L3 laminectomy with wide posterior decompression. No change or recurrent stenosis.  L4-L5: L4 laminectomy. Wide posterior decompression. No recurrent stenosis.  L5-S1: L5 laminectomy. Wide posterior decompression. No recurrent stenosis. IMPRESSION: 1. Interval LEFT L1-L2 foraminal and lateral partial discectomy denuded aggressive L1-L2 disc degeneration  with collapse of the disc space and grade I retrolisthesis. Mild LEFT foraminal stenosis resulting from disc disease and spondylolisthesis. Mild central stenosis associated with bulging disc and posterior ligamentum flavum redundancy. 2. Unchanged L2 through S1 posterior lumbar interbody fusion.   Assessment/Plan Chronic low back and leg pain.  Failed conservative treatment. Will proceed with T12 to L2 fusion (Extension of Previous Fusion L2-S1 to T12-S1), Right Transforaminal lumbar interbody fusion, Posterior Fusion T12 to L2 with hooks at T12 and L1, Narrow Pedicles L1 Bilateral, allograft, local bone graft, Vivigen. Surgical procedure along with possible risks and complications discussed.  All questions answered.    OWENS,JAMES M 04/19/2015, 3:52 PM   Patient examined and lab reviewed with Ricard Dillon, PA-C.

## 2015-04-20 ENCOUNTER — Encounter (HOSPITAL_COMMUNITY)
Admission: RE | Disposition: A | Discharge status: Home / Self Care | Payer: Medicare Other | Source: Ambulatory Visit | Attending: Specialist

## 2015-04-20 ENCOUNTER — Inpatient Hospital Stay (HOSPITAL_COMMUNITY): Payer: Medicare Other

## 2015-04-20 ENCOUNTER — Inpatient Hospital Stay (HOSPITAL_COMMUNITY)
Admission: RE | Admit: 2015-04-20 | Discharge: 2015-04-23 | DRG: 460 | Disposition: A | Discharge status: Home / Self Care | Payer: Medicare Other | Source: Ambulatory Visit | Attending: Specialist | Admitting: Specialist

## 2015-04-20 ENCOUNTER — Encounter (HOSPITAL_COMMUNITY): Payer: Self-pay | Admitting: *Deleted

## 2015-04-20 ENCOUNTER — Inpatient Hospital Stay (HOSPITAL_COMMUNITY): Payer: Medicare Other | Admitting: Certified Registered Nurse Anesthetist

## 2015-04-20 ENCOUNTER — Inpatient Hospital Stay (HOSPITAL_COMMUNITY): Payer: Medicare Other | Admitting: Emergency Medicine

## 2015-04-20 DIAGNOSIS — M4316 Spondylolisthesis, lumbar region: Secondary | ICD-10-CM | POA: Diagnosis not present

## 2015-04-20 DIAGNOSIS — M549 Dorsalgia, unspecified: Secondary | ICD-10-CM | POA: Diagnosis present

## 2015-04-20 DIAGNOSIS — Z87891 Personal history of nicotine dependence: Secondary | ICD-10-CM

## 2015-04-20 DIAGNOSIS — Z8249 Family history of ischemic heart disease and other diseases of the circulatory system: Secondary | ICD-10-CM | POA: Diagnosis not present

## 2015-04-20 DIAGNOSIS — I2510 Atherosclerotic heart disease of native coronary artery without angina pectoris: Secondary | ICD-10-CM | POA: Diagnosis present

## 2015-04-20 DIAGNOSIS — K219 Gastro-esophageal reflux disease without esophagitis: Secondary | ICD-10-CM | POA: Diagnosis present

## 2015-04-20 DIAGNOSIS — M5136 Other intervertebral disc degeneration, lumbar region: Secondary | ICD-10-CM | POA: Diagnosis present

## 2015-04-20 DIAGNOSIS — M4806 Spinal stenosis, lumbar region: Secondary | ICD-10-CM | POA: Diagnosis not present

## 2015-04-20 DIAGNOSIS — M48062 Spinal stenosis, lumbar region with neurogenic claudication: Secondary | ICD-10-CM | POA: Diagnosis present

## 2015-04-20 DIAGNOSIS — E039 Hypothyroidism, unspecified: Secondary | ICD-10-CM | POA: Diagnosis present

## 2015-04-20 DIAGNOSIS — Z189 Retained foreign body fragments, unspecified material: Secondary | ICD-10-CM

## 2015-04-20 DIAGNOSIS — E785 Hyperlipidemia, unspecified: Secondary | ICD-10-CM | POA: Diagnosis present

## 2015-04-20 DIAGNOSIS — I10 Essential (primary) hypertension: Secondary | ICD-10-CM | POA: Diagnosis not present

## 2015-04-20 DIAGNOSIS — M5416 Radiculopathy, lumbar region: Secondary | ICD-10-CM

## 2015-04-20 DIAGNOSIS — Z419 Encounter for procedure for purposes other than remedying health state, unspecified: Secondary | ICD-10-CM

## 2015-04-20 HISTORY — PX: LUMBAR FUSION: SHX111

## 2015-04-20 LAB — BASIC METABOLIC PANEL
ANION GAP: 9 (ref 5–15)
BUN: 18 mg/dL (ref 6–20)
CHLORIDE: 102 mmol/L (ref 101–111)
CO2: 26 mmol/L (ref 22–32)
CREATININE: 1.08 mg/dL (ref 0.61–1.24)
Calcium: 8.6 mg/dL — ABNORMAL LOW (ref 8.9–10.3)
GFR calc non Af Amer: >60 mL/min (ref >60)
Glucose, Bld: 110 mg/dL — ABNORMAL HIGH (ref 65–99)
Potassium: 4 mmol/L (ref 3.5–5.1)
Sodium: 137 mmol/L (ref 135–145)

## 2015-04-20 SURGERY — FUSION, SPINE, LUMBAR, MINIMALLY INVASIVE
Anesthesia: General | Site: Spine Lumbar

## 2015-04-20 MED ORDER — ONDANSETRON HCL 4 MG/2ML IJ SOLN: 4.0000 mg | INTRAMUSCULAR | Status: DC | PRN

## 2015-04-20 MED ORDER — LIDOCAINE HCL (CARDIAC) 20 MG/ML IV SOLN
INTRAVENOUS | Status: DC | PRN
  Administered 2015-04-20: 40 mg via INTRAVENOUS

## 2015-04-20 MED ORDER — LOSARTAN POTASSIUM-HCTZ 100-12.5 MG PO TABS: 1.0000 | ORAL_TABLET | Freq: Every day | ORAL | Status: DC

## 2015-04-20 MED ORDER — POTASSIUM CHLORIDE IN NACL 20-0.9 MEQ/L-% IV SOLN
INTRAVENOUS | Status: DC
  Administered 2015-04-20: 1000 mL via INTRAVENOUS
  Filled 2015-04-20 (×10): qty 1000

## 2015-04-20 MED ORDER — ACETAMINOPHEN 325 MG PO TABS: 650.0000 mg | ORAL_TABLET | ORAL | Status: DC | PRN

## 2015-04-20 MED ORDER — OXYCODONE HCL 5 MG/5ML PO SOLN: 5.0000 mg | Freq: Once | ORAL | Status: AC | PRN

## 2015-04-20 MED ORDER — SUGAMMADEX SODIUM 200 MG/2ML IV SOLN
INTRAVENOUS | Status: AC
  Filled 2015-04-20: qty 2

## 2015-04-20 MED ORDER — OXYCODONE-ACETAMINOPHEN 5-325 MG PO TABS
ORAL_TABLET | ORAL | Status: AC
  Filled 2015-04-20: qty 2

## 2015-04-20 MED ORDER — METHOCARBAMOL 1000 MG/10ML IJ SOLN
500.0000 mg | Freq: Four times a day (QID) | INTRAVENOUS | Status: DC | PRN
  Filled 2015-04-20: qty 5

## 2015-04-20 MED ORDER — SENNOSIDES-DOCUSATE SODIUM 8.6-50 MG PO TABS: 1.0000 | ORAL_TABLET | Freq: Every evening | ORAL | Status: DC | PRN

## 2015-04-20 MED ORDER — MIDAZOLAM HCL 2 MG/2ML IJ SOLN
INTRAMUSCULAR | Status: AC
  Filled 2015-04-20: qty 4

## 2015-04-20 MED ORDER — STERILE WATER FOR INJECTION IJ SOLN
INTRAMUSCULAR | Status: AC
  Filled 2015-04-20: qty 10

## 2015-04-20 MED ORDER — POTASSIUM CHLORIDE ER 10 MEQ PO TBCR: 10.0000 meq | EXTENDED_RELEASE_TABLET | Freq: Every day | ORAL | Status: DC

## 2015-04-20 MED ORDER — HYDROMORPHONE HCL 1 MG/ML IJ SOLN
0.2500 mg | INTRAMUSCULAR | Status: DC | PRN
  Administered 2015-04-20: 1 mg via INTRAVENOUS

## 2015-04-20 MED ORDER — MORPHINE SULFATE (PF) 2 MG/ML IV SOLN
1.0000 mg | INTRAVENOUS | Status: DC | PRN
  Administered 2015-04-20 – 2015-04-21 (×4): 2 mg via INTRAVENOUS
  Filled 2015-04-20 (×4): qty 1

## 2015-04-20 MED ORDER — METHOCARBAMOL 500 MG PO TABS
500.0000 mg | ORAL_TABLET | Freq: Four times a day (QID) | ORAL | Status: DC | PRN
  Administered 2015-04-20: 500 mg via ORAL
  Filled 2015-04-20 (×3): qty 1

## 2015-04-20 MED ORDER — ONDANSETRON HCL 4 MG/2ML IJ SOLN
4.0000 mg | Freq: Once | INTRAMUSCULAR | Status: AC | PRN
  Administered 2015-04-20: 4 mg via INTRAVENOUS

## 2015-04-20 MED ORDER — SODIUM CHLORIDE 0.9 % IJ SOLN: 3.0000 mL | INTRAMUSCULAR | Status: DC | PRN

## 2015-04-20 MED ORDER — PHENYLEPHRINE HCL 10 MG/ML IJ SOLN
INTRAMUSCULAR | Status: DC | PRN
  Administered 2015-04-20: 40 ug via INTRAVENOUS
  Administered 2015-04-20: 80 ug via INTRAVENOUS

## 2015-04-20 MED ORDER — ONDANSETRON HCL 4 MG/2ML IJ SOLN
INTRAMUSCULAR | Status: AC
Start: 2015-04-20 — End: 2015-04-21
  Filled 2015-04-20: qty 2

## 2015-04-20 MED ORDER — OXYCODONE-ACETAMINOPHEN 5-325 MG PO TABS
1.0000 | ORAL_TABLET | ORAL | Status: DC | PRN
  Administered 2015-04-20 – 2015-04-22 (×7): 2 via ORAL
  Filled 2015-04-20 (×8): qty 2

## 2015-04-20 MED ORDER — MIDAZOLAM HCL 5 MG/5ML IJ SOLN
INTRAMUSCULAR | Status: DC | PRN
  Administered 2015-04-20: 2 mg via INTRAVENOUS

## 2015-04-20 MED ORDER — FENTANYL CITRATE (PF) 100 MCG/2ML IJ SOLN
INTRAMUSCULAR | Status: DC | PRN
  Administered 2015-04-20: 100 ug via INTRAVENOUS
  Administered 2015-04-20: 50 ug via INTRAVENOUS
  Administered 2015-04-20: 100 ug via INTRAVENOUS
  Administered 2015-04-20 (×6): 50 ug via INTRAVENOUS
  Administered 2015-04-20 (×2): 100 ug via INTRAVENOUS

## 2015-04-20 MED ORDER — THROMBIN 20000 UNITS EX SOLR
CUTANEOUS | Status: AC
  Filled 2015-04-20: qty 20000

## 2015-04-20 MED ORDER — BUPIVACAINE LIPOSOME 1.3 % IJ SUSP
20.0000 mL | INTRAMUSCULAR | Status: AC
  Administered 2015-04-20: 20 mL
  Filled 2015-04-20: qty 20

## 2015-04-20 MED ORDER — LOSARTAN POTASSIUM 50 MG PO TABS
100.0000 mg | ORAL_TABLET | Freq: Every day | ORAL | Status: DC
  Administered 2015-04-21 – 2015-04-22 (×2): 100 mg via ORAL
  Filled 2015-04-20 (×2): qty 2

## 2015-04-20 MED ORDER — MENTHOL 3 MG MT LOZG: 1.0000 | LOZENGE | OROMUCOSAL | Status: DC | PRN

## 2015-04-20 MED ORDER — EPHEDRINE SULFATE 50 MG/ML IJ SOLN
INTRAMUSCULAR | Status: DC | PRN
  Administered 2015-04-20 (×4): 5 mg via INTRAVENOUS

## 2015-04-20 MED ORDER — THROMBIN 20000 UNITS EX SOLR
CUTANEOUS | Status: DC | PRN
  Administered 2015-04-20: 20 mL via TOPICAL

## 2015-04-20 MED ORDER — CEFAZOLIN SODIUM 1-5 GM-% IV SOLN
1.0000 g | Freq: Three times a day (TID) | INTRAVENOUS | Status: AC
  Administered 2015-04-20 – 2015-04-21 (×2): 1 g via INTRAVENOUS
  Filled 2015-04-20 (×3): qty 50

## 2015-04-20 MED ORDER — KETOROLAC TROMETHAMINE 30 MG/ML IJ SOLN
30.0000 mg | Freq: Once | INTRAMUSCULAR | Status: AC
  Administered 2015-04-20: 30 mg via INTRAVENOUS
  Filled 2015-04-20: qty 1

## 2015-04-20 MED ORDER — HEMOSTATIC AGENTS (NO CHARGE) OPTIME
TOPICAL | Status: DC | PRN
  Administered 2015-04-20: 1 via TOPICAL

## 2015-04-20 MED ORDER — LACTATED RINGERS IV SOLN
INTRAVENOUS | Status: DC | PRN
  Administered 2015-04-20 (×3): via INTRAVENOUS

## 2015-04-20 MED ORDER — FENTANYL CITRATE (PF) 250 MCG/5ML IJ SOLN
INTRAMUSCULAR | Status: AC
  Filled 2015-04-20: qty 5

## 2015-04-20 MED ORDER — SODIUM CHLORIDE 0.9 % IJ SOLN
3.0000 mL | Freq: Two times a day (BID) | INTRAMUSCULAR | Status: DC
  Administered 2015-04-20 – 2015-04-22 (×5): 3 mL via INTRAVENOUS

## 2015-04-20 MED ORDER — ROCURONIUM BROMIDE 100 MG/10ML IV SOLN
INTRAVENOUS | Status: DC | PRN
  Administered 2015-04-20 (×2): 50 mg via INTRAVENOUS

## 2015-04-20 MED ORDER — SUCCINYLCHOLINE CHLORIDE 20 MG/ML IJ SOLN
INTRAMUSCULAR | Status: AC
  Filled 2015-04-20: qty 1

## 2015-04-20 MED ORDER — PHENYLEPHRINE HCL 10 MG/ML IJ SOLN
10.0000 mg | INTRAVENOUS | Status: DC | PRN
  Administered 2015-04-20: 10 ug/min via INTRAVENOUS

## 2015-04-20 MED ORDER — HYDROMORPHONE HCL 1 MG/ML IJ SOLN
INTRAMUSCULAR | Status: AC
  Filled 2015-04-20: qty 1

## 2015-04-20 MED ORDER — METOPROLOL SUCCINATE ER 25 MG PO TB24
25.0000 mg | ORAL_TABLET | Freq: Every day | ORAL | Status: DC
  Administered 2015-04-21 – 2015-04-23 (×3): 25 mg via ORAL
  Filled 2015-04-20 (×3): qty 1

## 2015-04-20 MED ORDER — ACETAMINOPHEN 650 MG RE SUPP: 650.0000 mg | RECTAL | Status: DC | PRN

## 2015-04-20 MED ORDER — EPHEDRINE SULFATE 50 MG/ML IJ SOLN
INTRAMUSCULAR | Status: AC
  Filled 2015-04-20: qty 1

## 2015-04-20 MED ORDER — FLEET ENEMA 7-19 GM/118ML RE ENEM: 1.0000 | ENEMA | Freq: Once | RECTAL | Status: DC | PRN

## 2015-04-20 MED ORDER — BUPIVACAINE HCL 0.5 % IJ SOLN
INTRAMUSCULAR | Status: DC | PRN
  Administered 2015-04-20: 30 mL

## 2015-04-20 MED ORDER — OXYCODONE HCL 5 MG PO TABS
5.0000 mg | ORAL_TABLET | Freq: Once | ORAL | Status: AC | PRN
  Administered 2015-04-20: 5 mg via ORAL

## 2015-04-20 MED ORDER — BISACODYL 5 MG PO TBEC: 5.0000 mg | DELAYED_RELEASE_TABLET | Freq: Every day | ORAL | Status: DC | PRN

## 2015-04-20 MED ORDER — ALUM & MAG HYDROXIDE-SIMETH 200-200-20 MG/5ML PO SUSP: 30.0000 mL | Freq: Four times a day (QID) | ORAL | Status: DC | PRN

## 2015-04-20 MED ORDER — ONDANSETRON HCL 4 MG/2ML IJ SOLN
INTRAMUSCULAR | Status: DC | PRN
  Administered 2015-04-20: 4 mg via INTRAVENOUS

## 2015-04-20 MED ORDER — ZOLPIDEM TARTRATE 5 MG PO TABS
5.0000 mg | ORAL_TABLET | Freq: Every evening | ORAL | Status: DC | PRN
  Administered 2015-04-21: 5 mg via ORAL
  Filled 2015-04-20 (×2): qty 1

## 2015-04-20 MED ORDER — SODIUM CHLORIDE 0.9 % IV SOLN: 250.0000 mL | INTRAVENOUS | Status: DC

## 2015-04-20 MED ORDER — SUGAMMADEX SODIUM 200 MG/2ML IV SOLN
INTRAVENOUS | Status: DC | PRN
  Administered 2015-04-20: 200 mg via INTRAVENOUS

## 2015-04-20 MED ORDER — 0.9 % SODIUM CHLORIDE (POUR BTL) OPTIME
TOPICAL | Status: DC | PRN
  Administered 2015-04-20: 1000 mL

## 2015-04-20 MED ORDER — ROCURONIUM BROMIDE 50 MG/5ML IV SOLN
INTRAVENOUS | Status: AC
  Filled 2015-04-20: qty 1

## 2015-04-20 MED ORDER — LEVOTHYROXINE SODIUM 100 MCG PO TABS
100.0000 ug | ORAL_TABLET | Freq: Every day | ORAL | Status: DC
  Administered 2015-04-21 – 2015-04-23 (×3): 100 ug via ORAL
  Filled 2015-04-20 (×3): qty 1

## 2015-04-20 MED ORDER — OXYCODONE HCL 5 MG PO TABS
ORAL_TABLET | ORAL | Status: AC
  Filled 2015-04-20: qty 1

## 2015-04-20 MED ORDER — PROPOFOL 10 MG/ML IV BOLUS
INTRAVENOUS | Status: AC
  Filled 2015-04-20: qty 20

## 2015-04-20 MED ORDER — CEFAZOLIN SODIUM-DEXTROSE 2-3 GM-% IV SOLR
INTRAVENOUS | Status: AC
  Filled 2015-04-20: qty 50

## 2015-04-20 MED ORDER — PROPOFOL 10 MG/ML IV BOLUS
INTRAVENOUS | Status: DC | PRN
  Administered 2015-04-20: 180 mg via INTRAVENOUS

## 2015-04-20 MED ORDER — HYDROCHLOROTHIAZIDE 12.5 MG PO CAPS
12.5000 mg | ORAL_CAPSULE | Freq: Every day | ORAL | Status: DC
  Administered 2015-04-21 – 2015-04-23 (×3): 12.5 mg via ORAL
  Filled 2015-04-20 (×3): qty 1

## 2015-04-20 MED ORDER — HYDROCODONE-ACETAMINOPHEN 5-325 MG PO TABS: 1.0000 | ORAL_TABLET | ORAL | Status: DC | PRN

## 2015-04-20 MED ORDER — DEXAMETHASONE SODIUM PHOSPHATE 4 MG/ML IJ SOLN
INTRAMUSCULAR | Status: DC | PRN
  Administered 2015-04-20: 10 mg via INTRAVENOUS

## 2015-04-20 MED ORDER — PANTOPRAZOLE SODIUM 40 MG IV SOLR
40.0000 mg | Freq: Every day | INTRAVENOUS | Status: DC
  Administered 2015-04-20: 40 mg via INTRAVENOUS
  Filled 2015-04-20: qty 40

## 2015-04-20 MED ORDER — PHENOL 1.4 % MT LIQD: 1.0000 | OROMUCOSAL | Status: DC | PRN

## 2015-04-20 MED ORDER — OXYCODONE HCL ER 10 MG PO T12A
20.0000 mg | EXTENDED_RELEASE_TABLET | Freq: Two times a day (BID) | ORAL | Status: DC
Start: 2015-04-20 — End: 2015-04-22
  Administered 2015-04-21 (×2): 20 mg via ORAL
  Filled 2015-04-20 (×2): qty 2

## 2015-04-20 MED ORDER — BUPIVACAINE HCL (PF) 0.5 % IJ SOLN
INTRAMUSCULAR | Status: AC
Start: 2015-04-20 — End: 2015-04-20
  Filled 2015-04-20: qty 30

## 2015-04-20 MED ORDER — METHOCARBAMOL 500 MG PO TABS
ORAL_TABLET | ORAL | Status: AC
Start: 2015-04-20 — End: 2015-04-21
  Filled 2015-04-20: qty 1

## 2015-04-20 MED ORDER — DOCUSATE SODIUM 100 MG PO CAPS
100.0000 mg | ORAL_CAPSULE | Freq: Two times a day (BID) | ORAL | Status: DC
  Administered 2015-04-20 – 2015-04-23 (×6): 100 mg via ORAL
  Filled 2015-04-20 (×6): qty 1

## 2015-04-20 MED FILL — Heparin Sodium (Porcine) Inj 1000 Unit/ML: INTRAMUSCULAR | Qty: 30 | Status: AC

## 2015-04-20 MED FILL — Sodium Chloride Irrigation Soln 0.9%: Qty: 3000 | Status: AC

## 2015-04-20 MED FILL — Sodium Chloride IV Soln 0.9%: INTRAVENOUS | Qty: 1000 | Status: AC

## 2015-04-20 SURGICAL SUPPLY — 79 items
ADH SKN CLS APL DERMABOND .7 (GAUZE/BANDAGES/DRESSINGS) ×1
BAG DECANTER FOR FLEXI CONT (MISCELLANEOUS) ×1 IMPLANT
BIT DRILL NEURO 2X3.1 SFT TUCH (MISCELLANEOUS) IMPLANT
BLADE SURG 10 STRL SS (BLADE) ×1 IMPLANT
BLADE SURG ROTATE 9660 (MISCELLANEOUS) ×1 IMPLANT
BONE CANC CHIPS 20CC PCAN1/4 (Bone Implant) ×2 IMPLANT
BONE MATRIX VIVIGEN 5CC (Bone Implant) ×2 IMPLANT
BUR RND FLUTED 2.5 (BURR) IMPLANT
BUR SABER RD CUTTING 3.0 (BURR) IMPLANT
CAGE BULLET CONCORDE 9X9X27 (Cage) ×1 IMPLANT
CHIPS CANC BONE 20CC PCAN1/4 (Bone Implant) ×1 IMPLANT
CONNECTOR EXPEDIUM 5.5 (Connector) ×4 IMPLANT
COVER SURGICAL LIGHT HANDLE (MISCELLANEOUS) ×2 IMPLANT
CROSSLINK EXPEDIUM SFX A1 (Neuro Prosthesis/Implant) ×1 IMPLANT
DECANTER SPIKE VIAL GLASS SM (MISCELLANEOUS) ×1 IMPLANT
DERMABOND ADVANCED (GAUZE/BANDAGES/DRESSINGS) ×1
DERMABOND ADVANCED .7 DNX12 (GAUZE/BANDAGES/DRESSINGS) ×1 IMPLANT
DRAPE C-ARM 42X72 X-RAY (DRAPES) ×2 IMPLANT
DRAPE C-ARMOR (DRAPES) ×2 IMPLANT
DRAPE MICROSCOPE LEICA (MISCELLANEOUS) ×1 IMPLANT
DRAPE SURG 17X23 STRL (DRAPES) ×8 IMPLANT
DRAPE TABLE COVER HEAVY DUTY (DRAPES) ×2 IMPLANT
DRILL NEURO 2X3.1 SOFT TOUCH (MISCELLANEOUS) ×2
DRSG MEPILEX BORDER 4X12 (GAUZE/BANDAGES/DRESSINGS) ×1 IMPLANT
DRSG MEPILEX BORDER 4X4 (GAUZE/BANDAGES/DRESSINGS) IMPLANT
DRSG MEPILEX BORDER 4X8 (GAUZE/BANDAGES/DRESSINGS) IMPLANT
DURAPREP 26ML APPLICATOR (WOUND CARE) ×2 IMPLANT
ELECT BLADE 6.5 EXT (BLADE) ×1 IMPLANT
ELECT CAUTERY BLADE 6.4 (BLADE) ×2 IMPLANT
ELECT REM PT RETURN 9FT ADLT (ELECTROSURGICAL) ×2
ELECTRODE REM PT RTRN 9FT ADLT (ELECTROSURGICAL) ×1 IMPLANT
EVACUATOR 1/8 PVC DRAIN (DRAIN) IMPLANT
GAUZE SPONGE 4X4 12PLY STRL (GAUZE/BANDAGES/DRESSINGS) ×2 IMPLANT
GLOVE BIOGEL PI IND STRL 8 (GLOVE) ×1 IMPLANT
GLOVE BIOGEL PI INDICATOR 8 (GLOVE) ×1
GLOVE ECLIPSE 9.0 STRL (GLOVE) ×2 IMPLANT
GLOVE ORTHO TXT STRL SZ7.5 (GLOVE) ×2 IMPLANT
GLOVE SURG 8.5 LATEX PF (GLOVE) ×2 IMPLANT
GOWN STRL REUS W/ TWL LRG LVL3 (GOWN DISPOSABLE) ×1 IMPLANT
GOWN STRL REUS W/TWL 2XL LVL3 (GOWN DISPOSABLE) ×4 IMPLANT
GOWN STRL REUS W/TWL LRG LVL3 (GOWN DISPOSABLE) ×2
GRAFT BNE CANC CHIPS 1-8 20CC (Bone Implant) IMPLANT
GRAFT BNE MATRIX VG 5 (Bone Implant) IMPLANT
KIT BASIN OR (CUSTOM PROCEDURE TRAY) ×2 IMPLANT
KIT POSITION SURG JACKSON T1 (MISCELLANEOUS) ×2 IMPLANT
KIT ROOM TURNOVER OR (KITS) ×2 IMPLANT
NDL ASP BONE MRW 11GX15 (NEEDLE) IMPLANT
NDL SPNL 18GX3.5 QUINCKE PK (NEEDLE) ×1 IMPLANT
NEEDLE 22X1 1/2 (OR ONLY) (NEEDLE) ×2 IMPLANT
NEEDLE ASP BONE MRW 11GX15 (NEEDLE) IMPLANT
NEEDLE BONE MARROW 8GAX6 (NEEDLE) IMPLANT
NEEDLE SPNL 18GX3.5 QUINCKE PK (NEEDLE) ×2 IMPLANT
NS IRRIG 1000ML POUR BTL (IV SOLUTION) ×2 IMPLANT
PACK LAMINECTOMY ORTHO (CUSTOM PROCEDURE TRAY) ×2 IMPLANT
PAD ARMBOARD 7.5X6 YLW CONV (MISCELLANEOUS) ×4 IMPLANT
PATTIES SURGICAL .75X.75 (GAUZE/BANDAGES/DRESSINGS) ×2 IMPLANT
PATTIES SURGICAL 1X1 (DISPOSABLE) ×1 IMPLANT
ROD EXPEDIUM 300MM (Rod) ×1 IMPLANT
SCREW SET SINGLE INNER (Screw) ×4 IMPLANT
SCREW VIPER CORT FIX 4.35X40 (Screw) ×1 IMPLANT
SCREW VIPER CORT FIX 5.00X40 (Screw) ×1 IMPLANT
SCREW VIPER CORTICAL FIX 6X40 (Screw) ×2 IMPLANT
SPONGE SURGIFOAM ABS GEL 100 (HEMOSTASIS) ×2 IMPLANT
SURGIFLO W/THROMBIN 8M KIT (HEMOSTASIS) ×1 IMPLANT
SUT VIC AB 0 CT1 27 (SUTURE) ×2
SUT VIC AB 0 CT1 27XBRD ANBCTR (SUTURE) ×1 IMPLANT
SUT VIC AB 1 CTX 36 (SUTURE) ×6
SUT VIC AB 1 CTX36XBRD ANBCTR (SUTURE) ×2 IMPLANT
SUT VIC AB 2-0 CT1 27 (SUTURE) ×4
SUT VIC AB 2-0 CT1 TAPERPNT 27 (SUTURE) ×1 IMPLANT
SUT VIC AB 3-0 X1 27 (SUTURE) ×4 IMPLANT
SUT VICRYL 0 CT 1 36IN (SUTURE) ×4 IMPLANT
SYR 20CC LL (SYRINGE) ×2 IMPLANT
SYR CONTROL 10ML LL (SYRINGE) ×6 IMPLANT
TOWEL OR 17X24 6PK STRL BLUE (TOWEL DISPOSABLE) ×2 IMPLANT
TOWEL OR 17X26 10 PK STRL BLUE (TOWEL DISPOSABLE) ×2 IMPLANT
TRAY FOLEY CATH 16FRSI W/METER (SET/KITS/TRAYS/PACK) ×2 IMPLANT
WATER STERILE IRR 1000ML POUR (IV SOLUTION) ×1 IMPLANT
YANKAUER SUCT BULB TIP NO VENT (SUCTIONS) ×2 IMPLANT

## 2015-04-20 NOTE — Anesthesia Procedure Notes (Signed)
Procedure Name: Intubation Date/Time: 04/20/2015 7:53 AM Performed by: Ollen Bowl Pre-anesthesia Checklist: Patient identified, Emergency Drugs available, Suction available, Patient being monitored and Timeout performed Patient Re-evaluated:Patient Re-evaluated prior to inductionOxygen Delivery Method: Circle system utilized and Simple face mask Preoxygenation: Pre-oxygenation with 100% oxygen Intubation Type: Combination inhalational/ intravenous induction Ventilation: Mask ventilation without difficulty Laryngoscope Size: Miller and 3 Grade View: Grade I Tube type: Oral Tube size: 7.5 mm Number of attempts: 1 Airway Equipment and Method: Patient positioned with wedge pillow and Stylet Placement Confirmation: ETT inserted through vocal cords under direct vision,  positive ETCO2 and breath sounds checked- equal and bilateral Secured at: 22 cm Tube secured with: Tape Dental Injury: Teeth and Oropharynx as per pre-operative assessment

## 2015-04-20 NOTE — Transfer of Care (Signed)
Immediate Anesthesia Transfer of Care Note  Patient: Jeff Lopez  Procedure(s) Performed: Procedure(s): T12 to L1 fusion (Extension of Previous Fusion L2-S1 to T12-S1), Right Transforaminal lumbar interbody fusion, Posterior Fusion T12 to L1, with Pedicle screws, allograft, local bone graft, and  Vivigen (N/A)  Patient Location: PACU  Anesthesia Type:General  Level of Consciousness: awake and alert   Airway & Oxygen Therapy: Patient Spontanous Breathing and Patient connected to nasal cannula oxygen  Post-op Assessment: Report given to RN and Post -op Vital signs reviewed and stable  Post vital signs: Reviewed and stable  Last Vitals:  Filed Vitals:   04/20/15 0543  BP: 161/90  Pulse: 65  Temp: 37.9 C    Complications: No apparent anesthesia complications

## 2015-04-20 NOTE — Anesthesia Postprocedure Evaluation (Signed)
  Anesthesia Post-op Note  Patient: Jeff Lopez  Procedure(s) Performed: Procedure(s): T12 to L1 fusion (Extension of Previous Fusion L2-S1 to T12-S1), Right Transforaminal lumbar interbody fusion, Posterior Fusion T12 to L1, with Pedicle screws, allograft, local bone graft, and  Vivigen (N/A)  Patient Location: PACU  Anesthesia Type:General  Level of Consciousness: awake, alert  and oriented  Airway and Oxygen Therapy: Patient Spontanous Breathing  Post-op Pain: mild  Post-op Assessment: Post-op Vital signs reviewed, Patient's Cardiovascular Status Stable, Respiratory Function Stable, Patent Airway and Pain level controlled              Post-op Vital Signs: stable  Last Vitals:  Filed Vitals:   04/20/15 1605  BP: 123/77  Pulse: 62  Temp: 36.8 C  Resp: 17    Complications: No apparent anesthesia complications

## 2015-04-20 NOTE — Discharge Instructions (Signed)
° ° °  Call if there is increasing drainage, fever greater than 101.5, severe head aches, and worsening nausea or light sensitivity. If shortness of breath, bloody cough or chest tightness or pain go to an emergency room. No lifting greater than 10 lbs. Avoid bending, stooping and twisting. Use brace when sitting and out of bed even to go to bathroom. Walk in house for first 2 weeks then may start to get out slowly increasing distances up to one half mile by 4-6 weeks post op. After 5 days may shower and change dressing following bathing with shower.When bathing remove the brace shower and replace brace before getting out of the shower. If drainage, keep dry dressing and do not bathe the incision, use an moisture impervious dressing. Please call and return for scheduled follow up appointment 2 weeks from the time of surgery.

## 2015-04-20 NOTE — Anesthesia Preprocedure Evaluation (Signed)
Anesthesia Evaluation  Patient identified by MRN, date of birth, ID band Patient awake    Reviewed: Allergy & Precautions, NPO status , Patient's Chart, lab work & pertinent test results  Airway Mallampati: II  TM Distance: >3 FB Neck ROM: Full    Dental  (+) Teeth Intact   Pulmonary former smoker,    breath sounds clear to auscultation       Cardiovascular hypertension,  Rhythm:Regular Rate:Normal     Neuro/Psych    GI/Hepatic   Endo/Other    Renal/GU      Musculoskeletal   Abdominal   Peds  Hematology   Anesthesia Other Findings   Reproductive/Obstetrics                             Anesthesia Physical Anesthesia Plan  ASA: III  Anesthesia Plan: General   Post-op Pain Management:    Induction: Intravenous  Airway Management Planned: Oral ETT  Additional Equipment: Arterial line  Intra-op Plan:   Post-operative Plan: Extubation in OR  Informed Consent: I have reviewed the patients History and Physical, chart, labs and discussed the procedure including the risks, benefits and alternatives for the proposed anesthesia with the patient or authorized representative who has indicated his/her understanding and acceptance.     Plan Discussed with: CRNA and Anesthesiologist  Anesthesia Plan Comments:         Anesthesia Quick Evaluation

## 2015-04-20 NOTE — Interval H&P Note (Signed)
History and Physical Interval Note:  04/20/2015 7:22 AM  [name]  has presented today for surgery, with the diagnosis of L1-2 spondylolisthesis, spinal stenosis, degenerative disc disease above L2-S1 fusion  The various methods of treatment have been discussed with the patient and family. After consideration of risks, benefits and other options for treatment, the patient has consented to  Procedure(s): T12 to L2 fusion (Extension of Previous Fusion L2-S1 to T12-S1), Right Transforaminal lumbar interbody fusion, Posterior Fusion T12 to L2 with hooks at T12 and L1, Narrow Pedicles L1 Bilateral, allograft, local bone graft, Vivigen (N/A) as a surgical intervention .  [name] history has been reviewed, patient examined, no change in status, stable for surgery.  I have reviewed the patient's chart and labs.  Questions were answered to the patient's satisfaction.     NITKA,JAMES E

## 2015-04-20 NOTE — Brief Op Note (Signed)
04/20/2015  1:39 PM  PATIENT:  Jeff Lopez  64 y.o. male  PRE-OPERATIVE DIAGNOSIS:  L1-2 spondylolisthesis, spinal stenosis, degenerative disc disease above L2-S1 fusion  POST-OPERATIVE DIAGNOSIS:  L1-2 spondylolisthesis, spinal stenosis, degenerative disc disease above L2-S1 fusion  PROCEDURE: Procedure(s): T12 to L2 fusion (Extension of Previous Fusion L2-S1 to T12-S1), Right Transforaminal lumbar interbody fusion Right L1-2, Posterior Fusion T12 to L1 with pedicle screws and rods at T12 and L1,allograft, local bone graft, Vivigen (N/A)     SURGEON:  Surgeon(s) and Role:     [name], MD - Primary  PHYSICIAN ASSISTANT:James Ricard Dillon, PA-C   ANESTHESIA:   local and general Dr. Linna Caprice.  EBL:  Total I/O In: 2750 [I.V.:2500; Blood:250] Out: 1350 [Urine:800; Blood:550]  BLOOD ADMINISTERED:250cc CC PRBC  DRAINS: Urinary Catheter (Foley)   LOCAL MEDICATIONS USED:  MARCAINE 0.5% 1:1 EXPAREL 1.3% Amount:30 ml  SPECIMEN:  No Specimen  DISPOSITION OF SPECIMEN:  N/A  COUNTS:  YES  TOURNIQUET:  * No tourniquets in log *  DICTATION: .Dragon Dictation  PLAN OF CARE: Admit to inpatient   PATIENT DISPOSITION:  PACU - hemodynamically stable.   Delay start of Pharmacological VTE agent (>24hrs) due to surgical blood loss or risk of bleeding: yes

## 2015-04-20 NOTE — Op Note (Addendum)
04/20/2015  2:01 PM  PATIENT:  Jeff Lopez  64 y.o. male  MRN: [id number]  OPERATIVE REPORT  PRE-OPERATIVE DIAGNOSIS:  L1-2 spondylolisthesis, spinal stenosis, degenerative disc disease above L2-S1 fusion  POST-OPERATIVE DIAGNOSIS:  L1-2 spondylolisthesis, spinal stenosis, degenerative disc disease above L2-S1 fusion  PROCEDURE:  Procedure(s): T12 to L1 fusion (Extension of Previous Fusion L2-S1 to T12-S1), Right Transforaminal lumbar interbody fusion, Posterior Fusion T12 to L1, with Pedicle screws, allograft, local bone graft, and  Vivigen    SURGEON:  [name], MD     ASSISTANT:  Benjiman Core, PA-C  (Present throughout the entire procedure and necessary for complJames Owensetion of procedure in a timely manner)     ANESTHESIA:  General, supplemented with local anesthesia marcaine 0.5% 1:1 exparel 1.3% total 30 cc.  Dr. Linna Caprice.    COMPLICATIONS:  None.   EBL: 500cc  BLOOD RETURNED: Cell Saver Blood 250 cc  DRAINS: Foley to SD.    COMPONENTS:  Implant Name Type Inv. Item Serial No. Manufacturer Lot No. LRB No. Used  BONE MATRIX VIVIGEN 5CC - [id number] Bone Implant BONE MATRIX VIVIGEN 5CC [id number] [facility]  N/A 1  BONE CANC CHIPS 20CC - [id number] Bone Implant BONE CANC CHIPS 20CC [id number] [facility] [place]  N/A 1  CAGE BULLET CONCORDE 9X9X27MM - [id number] Cage CAGE BULLET CONCORDE 9X9X27MM  DEPUY SPINE  N/A 1  SCREW VIPER CORTICAL FIX 6X40 - [id number] Screw SCREW VIPER CORTICAL FIX 6X40  DEPUY SPINE  N/A 2  SCREW VIPER CORT FIX 5.00X40 - [id number] Screw SCREW VIPER CORT FIX 5.00X40  DEPUY SPINE  N/A 1  SCREW VIPER CORT FIX 4.35X40 - [id number] Screw SCREW VIPER CORT FIX 4.35X40  DEPUY SPINE  N/A 1  SCREW SET SINGLE INNER - [id number] Screw SCREW SET SINGLE INNER  DEPUY SPINE  N/A 4  ROD EXPEDIUM 300MM - [id number] Rod ROD EXPEDIUM 300MM  DEPUY SPINE  N/A 1  Expedium connector Orthopedic Implant   DEPUY SPINE  N/A 4   Expedium SFX crosslink A1 Orthopedic Implant     DEPUY SPINE   N/A 1     PROCEDURE:  The patient was met in the holding area, and the appropriate Right L1-2 and central T12-L1 lumbar levels identified and marked with an "X" and my initials. I had discussion with the patient in the preop holding area regarding consent form. Patient understands the rationale for the fusion site as the L1-2 and T12-L1 segment For stenosis and degenerative spondylolisthesis L1-2 above previous fusion L2 to S1 The patient was then transported to OR and was placed under general anestheticwithout difficulty. The patient received appropriate preoperative antibiotic prophylaxis 2 gram Ancef for oral thrush non anaphylatic ceftin allergy. Nursing staff inserted a Foley catheter under sterile conditions. The patient was then turned to a prone position using the [place] spine frame. PAS. all pressure points well padded the arms at the side to 90 90. Standard prep with DuraPrep solution draped in the usual manner from the lower dorsal spine the mid sacral segment. Iodine Vi-Drape was used and the incision was marked. Time-out procedure was called and correct. Loupe magnification and headlight were used during this portion procedure. Skin in the midline between T11and L4 was then infiltrated with local marcaine 0.5% 1:1 exparel 1.3% total of 20 cc used. Incision was then made through the skin and subcutaneous layers down to the patient's lumbodorsal fascia and spinous processes. The incision then carried sharply excising the supraspinous ligament  and then continuing the lateral aspect of the spinous processes T11,T12,L1 and L2 and carried lateral over the lateral facets at L1-2,T12-L1and T11-12. Cobb elevators used to carefully elevate the paralumbar muscles off of the posterior elements using electrocautery carefully drilled bleeding and perform dissection of the muscle tissues of the preserving the facet at T11-12. Continuing the  exposure out laterally to expose the retained pedicle screws and rods at L2, L3and L4. Bleeding controlled using electrocautery monopolar electrocautery. Viper retractor was used for the upper part of the incision.  C-arm fluoroscopy was then brought into the field and using C-arm fluoroscopy then a hole made into the lateral aspect of the left pedicle of L1 observed in the pedicle using ball-tipped nerve hook and hockey stick nerve probe initial entry was determined on fluoroscopy to be good position alignment so that a ball handled probe was then used to probe the Left L1 pedicle to a depth of nearly 40 mm observed on C-arm fluoroscopy to be beyond the midpoint of the lumbar vertebra and then position alignment within the left L1 pedicle this was then removed and the pedicle channel probed demonstrating patency no sign of rupture the cortex of the pedicle. Tapping with a 3.5 mm screw then 4.35 mm x 40 mm screw was placed on the table for use st the left side at the L1 level following TLIF. C-arm fluoroscopy was then brought into the field and using C-arm fluoroscopy then a hole made into the lateral aspect of the right pedicle of L1 observed in the pedicle using ball tipped nerve hook and hockey stick nerve probe initial entry was determined on fluoroscopy to be good position alignment so that a ball handled probe was then used to probe the right L1 pedicle opening to a depth of nearly 40 mm observed on C-arm fluoroscopy to be beyond the midpoint of the lumbar vertebra and then position alignment within the right L1 pedicle this was then removed and the pedicle channel probed demonstrating patency no sign of rupture the cortex of the pedicle. Tapping with a 4.35 mm then 5.16m screw tap then 5.0 mm x 40 mm screw was placed on the table for use at the right side at the L1 level following TLIF.C-arm fluoroscopy was then brought into the field and using C-arm fluoroscopy then a hole made into the lateral aspect of  the right pedicle of T12 observed in the pedicle using ball tipped nerve hook and hockey stick nerve probe initial entry was determined on fluoroscopy to be good position alignment so that a ball handled probe was then used to probe the left T12 pedicle to a depth of nearly 40 mm observed on C-arm fluoroscopy to be well aligned within the left T12 pedicle, the pedicle channel probed demonstrating patency no sign of rupture the cortex of the pedicle. Tapping with a 4.371m then 5.33m101mnd then 6 mm screw tap then 6.0 mm x 40 mm screw was placed on the table for use at the left side at the T12 level following TLIF. C-arm fluoroscopy was used to localize the hole made in the lateral aspect of the right pedicle of T12 on the left localizing the pedicle within the spinal canal with nerve hook and hockey-stick nerve probe carefully passed down the center of the T12 pedicle to a depth of nearly 40 mm. Observed on C-arm fluoroscopy to be in good position alignment channel was probed with a ball-tipped probe ensure patency no sign of cortical disruption. Following tapping  with a 4.12m, then a 5 mm tap and a 642mtap a 6.0 x 40 mm screw was placed on the table for use st the right side at the T12 level following L1-2 TLIF. A left sided lateral recess decompression then performed over the left side at L1-2 with Osteotome used to remove a portion of the left inferior L1 lamina and approximately 25% of the medial inferior articular process of L1. 3 and 4 mm kerrisons used to perform resection of the left inferior lamina of L1 up to the insertion of the ligamentum flavum. The Flavum then resected with 3 and 4 mm kerrisons performing formaminotomy over the left L2 and L1 neuroforamen. Hemostasis with bipolar electrocautery and then thrombin soaked gel foam and cottonoids. Attention turned to the right sided preparation for the TLIF. The right inferior articular process of L1-L2 was resected in order to provide for exposure of  the right side L1-2 neuroforamen for ease of placement of TLIFs (transforaminal lumbar interbody fusion) inferior portions of the L1 lamina were also resected first beginning with the Leksell rongeur and then resecting using osteotome then 2 and 3 mm Kerrison the central and right portions of the lamina of L1  performing foraminotomies on the right side at the L1-2 level. The inferior articular process L1 wass resected on the right side. The L2 nerve root identified right side over the medial aspect of the L2 pedicle. Superior articular process of L2 was then resected from the right side further decompressing the right L1 nerve and providing for exposure of the area just superior to the L2 pedicle for a placement of cage at the L1-2 level. A disc was exposed on the right side at the L1-2 level and this was resected. The OR microscope was draped sterilely and brought into the field to allow for freeing up of the right side of the thecal sac at the L1-2 Level elevating the L2 nerve root away from the medial right L2 pedicle, exposing the right L1-2 disc above the right L2 pedicle, Incising the disc with 15 blade scapel and then resecting the disc with micropituitary and then pituitary with teeth.The lateral recess and the neuroforamen were resected decompressing the L1and L2 nerve roots and foraminotomies was widely performed over the right L1 nerve and L2 nerve roots.  Attention then turned to placement of the right transforaminal lumbar interbody fusion cage.The OR microscope was sterilely draped and brought into the field. Using a [place] 4 the lateral aspect of the thecal sac and the inferior aspect of the L2 nerve root on the right side at the L1-2 level was carefully drilled. The thecal sac could then easily be retracted in the posterior lateral aspect of the L1-2 disc was exposed 15 blade scalpel used to incise the osteotome used to resect a small portion of bone off the superior aspect of the posterior  superior vertebral body of L2 in order to ease the entry into the L1-2 disc space. A pituitary rongeur was then able to be introduced in the disc space debrided it of degenerative disc material. 7 mm dilator was used to dialate the L1-2 disc space on the right side attempts were made to dilate further in increments to 33m61muccessfully and using small curettes and the disc space was debrided a minimal degenerative disc present in the endplates debrided to bleeding endplate bone.Trial cage placement within the disc place could only allow for an 33mm68mge. An 9 mm parallel Concorde cage was carefully packed  with morcellized bone graft and the been harvested from previous laminotomies additional bone graft local morselized, cancellous allograft chips and vivigen was then packed into the intervertebral disc space using the 7 mm trial to impacted the graft multiple times. With this then a 6m x 27 mm parallel Concord cage was introdudced into the disc space on the right side in the correct degree convergence and then impacted then subset beneath the posterior aspect of the disc space by about 3 or 4 mm. Bleeding controlled using bipolar electrocautery thrombin soaked gel cottonoids. Then turned to the right L12-L1 level similarly the exposure the posterior aspect this was carried out electrocautery to control small bleeders present. Decortication of the posterior elements of T12 and L1 and the facets bilaterally at L1-2 and T12-L1 then packed with local bone graft cancellous allograft chips and vivigen Bleeding controlled using bipolar electrocautery. Bleeding was hemostasis attention was turned to the placement of the rods and Rod sleeves . The exposed retained hardware bilateral L2-L4 pedicle screw fasteners and rods. The crosslink was removed at the L3-4 level as there was not enough space between the fasteners at L3-L4 to allow for placement of a connector sleeve so the Crosslink was removed and the connecting  sleeve connected to the retained rod bilaterally at the L2-3 and L3-4 levels. With the transforaminal lumbar interbody fusion portion of the case completed bleeders were controlled using bipolar electrocautery thrombin-soaked Gelfoam were appropriate. 2 pedicle screws on the left T12 and L1 and 2 Pedicle screws on the right T12 and L1 levels and then each carefully placed and aligned to allow for placement of rods. The  rod was then contoured using a template on the left side and placed into the pedicle screws on the left extending from L2-3 and L3-4 rod connector sleeves into each of the left L1 and T12 pedicle fastener and caps carefully placed loosely tightened. Attention turned to the right side were similarly screws were carefully adjusted to allow for a better pattern screws to allow for placement of fixation rod template for the rod was then taken and a contoured titanium rod was placed. The rod placed into the connector sleeve at the L2-3 and L3-4 levels and reduced into the right L1and T12 pedicle fasteners. Both rod connector sleeve screws bilaterallywere tightened to 80 foot lbs. The right L1 rod fastener cap was then tightened 85 pounds similarly on  At the right side disc space at L1-2 screw fasteners compress the L1-2 disc Posteriorly and the L1 fastener cap was torqued to 85 foot lbs, the left side between L1 and L2 then compressed by loosening the cap at the L1 level, using the compressor and tightening the left L1 cap to 85 foot lbs. Similarly this was done bilaterally at T12-L1 and T12-L1 compressed and tightened 85 pounds. A single cross link was measured to A1 size and this was placed at the L1-2 level and 3 screws torqued to 80 foot lbs. First the connectors the the central adjusting screw.  The left upper rod then divided above the left T12 pedicle screw fastener using a rod bolt cutter instrument. Irrigation was carried out with copious amounts of saline solution this was done  throughout the case. Cell Saver was used during the case.Permanent C-arm images were obtained in AP and lateral and oblique planes. Remaining local bone graft was then applied along the left and right posterior region extending from T12 through L2 posterior facets following decortication of the facets and along the  bilateralT12-L1 interlaminar area posteriorly. Excess Gelfoam was then removed the lumbodorsal musculature carefully exam debrided of any devitalized tissue following removal of Vicryl retractors were the bleeders were controlled using electrocautery and the area dorsal lumbar muscle were then approximated in the midline with interrupted #1 Vicryl sutures loose the dorsal fascia was reattached to the spinous process of T12, L1 and L2 to superiorly and inferiorly this was done with #1 Vicryl sutures. The para lumbar muscle approximated over the lower incision site with #1 vicryl. The subcutaneous layers then approximated over the drain using interrupted 0 Vicryl sutures and 2-0 Vicryl sutures. Skin was closed with a running subcutaneous stitch of 4-0 Vicryl Dermabond was applied then MedPlex bandage. Sponge counts were correct.However a 5/8ths bolt nut for the rod cutter was found to be Missing. Inspection of the incision showed no sign of the missing bolt cutter nut so intraoperative KUB obtained in addition to evaluating the C arm views. No sign of the missing nut was found on this patients studies. Dr. Jake Samples, the radiologist on call noted a Metallic density in the left outer pelvis lateral to the iliac crest. This was not the missing nut and was found to be consistent with the [facility] spine table connecting iliac and thigh pad connectors. The patient was then returned to a supine position on his bed reactivated extubated and returned to the recovery room in satisfactory condition.    Benjiman Core, PA-C perform the duties of assistant surgeon during this case. He was present from the beginning of  the case to the end of the case assisting in transfer the patient from his stretcher to the OR table and back to the stretcher at the end of the case. Assisted in careful retraction and suction of the laminectomy site delicate neural structures operating under the operating room microscope. He performed closure of the incision from the fascia to the skin applying the dressing.          Jeff Lopez  04/20/2015, 2:01 PM

## 2015-04-21 ENCOUNTER — Encounter (HOSPITAL_COMMUNITY): Payer: Self-pay | Admitting: Specialist

## 2015-04-21 DIAGNOSIS — M4316 Spondylolisthesis, lumbar region: Secondary | ICD-10-CM | POA: Diagnosis present

## 2015-04-21 DIAGNOSIS — M48062 Spinal stenosis, lumbar region with neurogenic claudication: Secondary | ICD-10-CM | POA: Diagnosis present

## 2015-04-21 LAB — CBC
HEMATOCRIT: 36.5 % — AB (ref 39.0–52.0)
Hemoglobin: 12.1 g/dL — ABNORMAL LOW (ref 13.0–17.0)
MCH: 32.5 pg (ref 26.0–34.0)
MCHC: 33.2 g/dL (ref 30.0–36.0)
MCV: 98.1 fL (ref 78.0–100.0)
Platelets: 151 10*3/uL (ref 150–400)
RBC: 3.72 MIL/uL — ABNORMAL LOW (ref 4.22–5.81)
RDW: 12.6 % (ref 11.5–15.5)
WBC: 12 10*3/uL — ABNORMAL HIGH (ref 4.0–10.5)

## 2015-04-21 LAB — BASIC METABOLIC PANEL
Anion gap: 12 (ref 5–15)
BUN: 17 mg/dL (ref 6–20)
CALCIUM: 8 mg/dL — AB (ref 8.9–10.3)
CO2: 24 mmol/L (ref 22–32)
CREATININE: 1.05 mg/dL (ref 0.61–1.24)
Chloride: 102 mmol/L (ref 101–111)
GFR calc Af Amer: >60 mL/min (ref >60)
GFR calc non Af Amer: >60 mL/min (ref >60)
GLUCOSE: 172 mg/dL — AB (ref 65–99)
Potassium: 4.3 mmol/L (ref 3.5–5.1)
Sodium: 138 mmol/L (ref 135–145)

## 2015-04-21 MED ORDER — PANTOPRAZOLE SODIUM 40 MG PO TBEC
40.0000 mg | DELAYED_RELEASE_TABLET | Freq: Every day | ORAL | Status: DC
  Administered 2015-04-21 – 2015-04-23 (×3): 40 mg via ORAL
  Filled 2015-04-21 (×3): qty 1

## 2015-04-21 NOTE — Progress Notes (Signed)
PT Cancellation Note  Patient Details Name: Jeff Lopez MRN: [id number] DOB: 1950-07-09   Cancelled Treatment:    Reason Eval/Treat Not Completed: Other (comment)  No brace (per RN has been called in).   Lexxus Underhill 04/21/2015, 8:37 AM  Pager [phone]

## 2015-04-21 NOTE — Care Management Note (Signed)
Case Management Note  Patient Details  Name: Jeff Lopez MRN: [id number] Date of Birth: [birth year]  Subjective/Objective:                    Action/Plan: Patient was admitted for lumbar surgery. Lives at home alone. Will follow for discharge needs pending PT/OT evals and physician orders.  Expected Discharge Date:                  Expected Discharge Plan:  [place]  In-House Referral:     Discharge planning Services  CM Consult  Post Acute Care Choice:    Choice offered to:     DME Arranged:    DME Agency:     HH Arranged:    [place] Agency:     Status of Service:  In process, will continue to follow  Medicare Important Message Given:    Date Medicare IM Given:    Medicare IM give by:    Date Additional Medicare IM Given:    Additional Medicare Important Message give by:     If discussed at [place] of Stay Meetings, dates discussed:    Additional Comments:  Rolm Baptise, RN 04/21/2015, 3:41 PM

## 2015-04-21 NOTE — Evaluation (Addendum)
Physical Therapy Evaluation Patient Details Name: Jeff Lopez MRN: [id number] DOB: [birth year] Today's Date: 04/21/2015   History of Present Illness  64 y.o. male s/p T12 to L1 fusion (Extension of Previous Fusion L2-S1 to T12-S1), Right Transforaminal lumbar interbody fusion, Posterior Fusion T12 to L1. Hx of HTN, CAD, and multiple spinal surgeries.  Clinical Impression  Patient is s/p above procedure, presenting with functional limitations due to the deficits listed below (see PT Problem List). Plans to stay with his father at discharge. Demonstrates ability to ambulate up to 120 feet today with contact guard support for safety and stability. Appears to have RLE instability at times but able to self correct. Reviewed precautions and appropriate application of spinal brace. Patient will benefit from skilled PT to increase their independence and safety with mobility to allow discharge to the venue listed below.       Follow Up Recommendations Supervision - Intermittent    Equipment Recommendations  None recommended by PT    Recommendations for Other Services OT consult     Precautions / Restrictions Precautions Precautions: Back Precaution Booklet Issued: Yes (comment) Precaution Comments: reviewed precautions Required Braces or Orthoses: Spinal Brace Spinal Brace: Thoracolumbosacral orthotic;Applied in sitting position Restrictions Weight Bearing Restrictions: No      Mobility  Bed Mobility Overal bed mobility: Needs Assistance Bed Mobility: Rolling;Sidelying to Sit Rolling: Min guard Sidelying to sit: Min guard       General bed mobility comments: Min guard for safety. Cues for precautions and technique with log roll. Used rail today.  Transfers Overall transfer level: Needs assistance Equipment used: None Transfers: Sit to/from Stand Sit to Stand: Min guard         General transfer comment: Min guard for safety. Slow to rise. Declines use of RW. Cues for  precautions.  Ambulation/Gait Ambulation/Gait assistance: Min assist Ambulation Distance (Feet): 120 Feet Assistive device: None Gait Pattern/deviations: Step-through pattern;Decreased stride length Gait velocity: decreased Gait velocity interpretation: Below normal speed for age/gender General Gait Details: Demonstrates minor instability, reaching for rail and wall at times. Declined to use RW today but states he will be more willing at next therapy session. VC for awareness of instability. Rt LE appears to buckle slightly at times but he is able to self correct. Hands on patient at all times for safety and stability but no overt loss of balance noted.  Stairs            Wheelchair Mobility    Modified Rankin (Stroke Patients Only)       Balance Overall balance assessment: Needs assistance Sitting-balance support: No upper extremity supported;Feet supported Sitting balance-Leahy Scale: Good     Standing balance support: No upper extremity supported Standing balance-Leahy Scale: Fair                               Pertinent Vitals/Pain Pain Assessment: 0-10 Pain Score: 5  Pain Location: back Pain Descriptors / Indicators: Burning Pain Intervention(s): Monitored during session;Repositioned    Home Living Family/patient expects to be discharged to:: Private residence Living Arrangements: Alone Available Help at Discharge: Family;Available PRN/intermittently ("as much as needed" staying with father) Type of Home: House Home Access: Level entry     Home Layout: One level Home Equipment: Walker - 2 wheels      Prior Function Level of Independence: Independent               Hand Dominance  Dominant Hand: Right    Extremity/Trunk Assessment   Upper Extremity Assessment: Defer to OT evaluation           Lower Extremity Assessment: Generalized weakness         Communication   Communication: No difficulties  Cognition  Arousal/Alertness: Awake/alert Behavior During Therapy: WFL for tasks assessed/performed Overall Cognitive Status: Within Functional Limits for tasks assessed                      General Comments General comments (skin integrity, edema, etc.): Reviewed application of TLSO    Exercises        Assessment/Plan    PT Assessment Patient needs continued PT services  PT Diagnosis Difficulty walking;Generalized weakness;Abnormality of gait;Acute pain   PT Problem List Decreased strength;Decreased range of motion;Decreased activity tolerance;Decreased balance;Decreased mobility;Decreased knowledge of use of DME;Decreased knowledge of precautions;Pain  PT Treatment Interventions DME instruction;Gait training;Functional mobility training;Therapeutic activities;Therapeutic exercise;Balance training;Neuromuscular re-education;Patient/family education;Modalities   PT Goals (Current goals can be found in the Care Plan section) Acute Rehab PT Goals Patient Stated Goal: No pain PT Goal Formulation: With patient Time For Goal Achievement: 05/05/15 Potential to Achieve Goals: Good    Frequency Min 5X/week   Barriers to discharge        Co-evaluation               End of Session Equipment Utilized During Treatment: Gait belt;Back brace Activity Tolerance: Patient tolerated treatment well Patient left: in chair;with call bell/phone within reach;with family/visitor present Nurse Communication: Mobility status;Precautions         Time: 2440-1027 PT Time Calculation (min) (ACUTE ONLY): 22 min   Charges:   PT Evaluation $Initial PT Evaluation Tier I: 1 Procedure     PT G CodesEllouise Newer 04/21/2015, 4:37 PM  Elayne Snare, [place]

## 2015-04-21 NOTE — Progress Notes (Signed)
     Subjective: 1 Day Post-Op Procedure(s) (LRB): T12 to L1 fusion (Extension of Previous Fusion L2-S1 to T12-S1), Right Transforaminal lumbar interbody fusion, Posterior Fusion T12 to L1, with Pedicle screws, allograft, local bone graft, and  Vivigen (N/A) Awake, alert and oriented x4. Severe pain yesterday. Taking po nourishment. Patient reports pain as marked.    Objective:   VITALS:  Temp:  [98 F (36.7 C)-98.6 F (37 C)] 98 F (36.7 C) (10/25 2141) Pulse Rate:  [62-88] 77 (10/26 0550) Resp:  [9-18] 18 (10/26 0550) BP: (112-148)/(51-88) 112/51 mmHg (10/26 0550) SpO2:  [96 %-100 %] 100 % (10/26 0550) Arterial Line BP: (133-154)/(67-80) 154/80 mmHg (10/25 1530)  Neurologically intact ABD soft Neurovascular intact Sensation intact distally Intact pulses distally Incision: no drainage   LABS  Recent Labs  04/21/15 0444  HGB 12.1*  WBC 12.0*  PLT 151    Recent Labs  04/20/15 0608 04/21/15 0444  NA 137 138  K 4.0 4.3  CL 102 102  CO2 26 24  BUN 18 17  CREATININE 1.08 1.05  GLUCOSE 110* 172*   No results for input(s): LABPT, INR in the last 72 hours.   Assessment/Plan: 1 Day Post-Op Procedure(s) (LRB): T12 to L1 fusion (Extension of Previous Fusion L2-S1 to T12-S1), Right Transforaminal lumbar interbody fusion, Posterior Fusion T12 to L1, with Pedicle screws, allograft, local bone graft, and  Vivigen (N/A)  Advance diet Up with therapy D/C IV fluids Needs TLSO prior to oob. Ortho tech should order from [employer].  Ortho Tech number [id number]  NITKA,JAMES E 04/21/2015, 7:06 AM

## 2015-04-21 NOTE — Progress Notes (Signed)
OT Cancellation Note  Patient Details Name: Jeff Lopez MRN: [id number] DOB: [birth year]   Cancelled Treatment:    Reason Eval/Treat Not Completed: Other (comment) (awaiting back brace) Per RN has been called in.  Hortencia Pilar 04/21/2015, 9:17 AM

## 2015-04-22 ENCOUNTER — Inpatient Hospital Stay (HOSPITAL_COMMUNITY): Payer: Medicare Other

## 2015-04-22 MED ORDER — OXYCODONE HCL ER 10 MG PO T12A
10.0000 mg | EXTENDED_RELEASE_TABLET | Freq: Two times a day (BID) | ORAL | Status: DC
  Administered 2015-04-22 – 2015-04-23 (×3): 10 mg via ORAL
  Filled 2015-04-22 (×3): qty 1

## 2015-04-22 MED ORDER — METHYLPREDNISOLONE 4 MG PO TBPK
4.0000 mg | ORAL_TABLET | ORAL | Status: AC
  Administered 2015-04-22: 4 mg via ORAL
  Filled 2015-04-22: qty 21

## 2015-04-22 MED ORDER — METHYLPREDNISOLONE 4 MG PO TBPK
4.0000 mg | ORAL_TABLET | Freq: Three times a day (TID) | ORAL | Status: DC
  Administered 2015-04-23: 4 mg via ORAL

## 2015-04-22 MED ORDER — METHYLPREDNISOLONE 4 MG PO TBPK
8.0000 mg | ORAL_TABLET | Freq: Every morning | ORAL | Status: AC
  Administered 2015-04-22: 8 mg via ORAL
  Filled 2015-04-22: qty 21

## 2015-04-22 MED ORDER — METHYLPREDNISOLONE 4 MG PO TBPK
8.0000 mg | ORAL_TABLET | Freq: Every evening | ORAL | Status: AC
  Administered 2015-04-22: 8 mg via ORAL

## 2015-04-22 MED ORDER — METHYLPREDNISOLONE 4 MG PO TBPK: 8.0000 mg | ORAL_TABLET | Freq: Every evening | ORAL | Status: DC

## 2015-04-22 MED ORDER — OXYCODONE HCL 5 MG PO TABS
20.0000 mg | ORAL_TABLET | ORAL | Status: DC | PRN
  Administered 2015-04-22 – 2015-04-23 (×5): 20 mg via ORAL
  Filled 2015-04-22 (×5): qty 4

## 2015-04-22 MED ORDER — PREGABALIN 75 MG PO CAPS
75.0000 mg | ORAL_CAPSULE | Freq: Two times a day (BID) | ORAL | Status: DC
  Administered 2015-04-22 – 2015-04-23 (×3): 75 mg via ORAL
  Filled 2015-04-22 (×3): qty 1

## 2015-04-22 MED ORDER — METHYLPREDNISOLONE 4 MG PO TBPK
4.0000 mg | ORAL_TABLET | Freq: Four times a day (QID) | ORAL | Status: DC
Start: 2015-04-24 — End: 2015-04-23

## 2015-04-22 NOTE — Care Management Note (Signed)
Case Management Note  Patient Details  Name: Jeff Lopez MRN: [id number] Date of Birth: 12/18/1950  Subjective/Objective:                    Action/Plan: Met with patient to discuss discharge planning. Patient has post-op HH and DME orders.  PT/OT do not recommend any home health at discharge.  This was discussed with patient, who states that he is in agreement and does not want HH services at this time.  Patient states he does not need the ordered DME, as he already has it at home.  Expected Discharge Date:                  Expected Discharge Plan:  Home/Self Care  In-House Referral:     Discharge planning Services  CM Consult  Post Acute Care Choice:  NA Choice offered to:     DME Arranged:    DME Agency:     HH Arranged:    HH Agency:     Status of Service:  Completed, signed off  Medicare Important Message Given:    Date Medicare IM Given:    Medicare IM give by:    Date Additional Medicare IM Given:    Additional Medicare Important Message give by:     If discussed at Long Length of Stay Meetings, dates discussed:    Additional Comments:  Robarge, Courtney C, RN 04/22/2015, 10:11 AM  

## 2015-04-22 NOTE — Progress Notes (Signed)
Occupational Therapy Evaluation Patient Details Name: Jeff Lopez MRN: [id number] DOB: 1951/05/07 Today's Date: 04/22/2015    History of Present Illness 64 y.o. male s/p T12 to L1 fusion (Extension of Previous Fusion L2-S1 to T12-S1), Right Transforaminal lumbar interbody fusion, Posterior Fusion T12 to L1. Hx of HTN, CAD, and multiple spinal surgeries.   Clinical Impression   Pt admitted with the above diagnoses and presents with below problem list. Pt will benefit from continued acute OT to address the below listed deficits and maximize independence with BADLs prior to d/c to venue below. PTA pt was independent with ADLs.Pt is currently min guard for toilet transfers and functional mobility, min A for UB/LB ADLs and mod A for shower transfers. Discussed having plan for which family members can assist with shower transfers and donning/doffing brace, etc. Also discussed AE and DME. Pt is staying with 41 yo father who can provide supervision but not a lot of assist. OT to continue to follow acutely.     Follow Up Recommendations  Supervision - Intermittent;Other (comment);No OT follow up (OOB/mobility)    Equipment Recommendations  Other (comment) (declined tub bench recommendation)    Recommendations for Other Services       Precautions / Restrictions Precautions Precautions: Back Precaution Comments: reviewed precautions Required Braces or Orthoses: Spinal Brace Spinal Brace: Thoracolumbosacral orthotic;Applied in sitting position Restrictions Weight Bearing Restrictions: No      Mobility Bed Mobility Overal bed mobility: Needs Assistance Bed Mobility: Rolling;Sit to Sidelying Rolling: Min guard       Sit to sidelying: Min guard General bed mobility comments: close min guard at BLE for sit to sidelying. HOB flat.  Transfers Overall transfer level: Needs assistance Equipment used: Rolling walker (2 wheeled) Transfers: Sit to/from Stand Sit to Stand: Min guard         General transfer comment: cues to bring rw closer prior to sitting.     Balance Overall balance assessment: Needs assistance Sitting-balance support: No upper extremity supported;Feet supported Sitting balance-Leahy Scale: Good     Standing balance support: Bilateral upper extremity supported;During functional activity Standing balance-Leahy Scale: Fair Standing balance comment: stood to wash hands at sink                            ADL Overall ADL's : Needs assistance/impaired Eating/Feeding: Set up;Sitting   Grooming: Min guard;Wash/dry hands;Standing   Upper Body Bathing: Minimal assitance;Sitting;Cueing for compensatory techniques   Lower Body Bathing: Minimal assistance;Sit to/from stand   Upper Body Dressing : Minimal assistance;Sitting Upper Body Dressing Details (indicate cue type and reason): assist to don brace. pt plans to have cousin assist with this at d/c Lower Body Dressing: Minimal assistance;Sit to/from stand   Toilet Transfer: Min guard;Ambulation;BSC;RW   Toileting- Clothing Manipulation and Hygiene: Minimal assistance;Sitting/lateral lean;Sit to/from stand   Tub/ Banker: Ambulation;3 in 1;Moderate assistance;Tub transfer Tub/Shower Transfer Details (indicate cue type and reason): discussed options for showering as pt will likely have difficulty stepping over tub wall. did not attempt simulation due to 10/10 pain. Functional mobility during ADLs: Min guard;Rolling walker General ADL Comments: Pt with difficulty accessing feet in seated position and likely will have difficulty stepping over tub wall at father's house. Discussed strategies for safe completion of ADLs at home (someone with him to assist with shower transfers and setup, reacher for LB ADLs, etc.)     Vision     Perception     Praxis  Pertinent Vitals/Pain Pain Assessment: 0-10 Pain Score: 10-Worst pain ever Pain Location: back and LLE Pain  Intervention(s): Limited activity within patient's tolerance;Monitored during session;Repositioned;Other (comment) (notified nursing)     Hand Dominance Right   Extremity/Trunk Assessment Upper Extremity Assessment Upper Extremity Assessment: Overall WFL for tasks assessed   Lower Extremity Assessment Lower Extremity Assessment: Defer to PT evaluation       Communication Communication Communication: No difficulties   Cognition Arousal/Alertness: Awake/alert Behavior During Therapy: WFL for tasks assessed/performed Overall Cognitive Status: Within Functional Limits for tasks assessed                     General Comments       Exercises       Shoulder Instructions      Home Living Family/patient expects to be discharged to:: Private residence Living Arrangements: Alone Available Help at Discharge: Family;Available PRN/intermittently Type of Home: House Home Access: Level entry     Home Layout: One level     Bathroom Shower/Tub: Tub/shower unit Shower/tub characteristics: Curtain Biochemist, clinical: Standard     Home Equipment: Environmental consultant - 2 wheels;Bedside commode;Adaptive equipment Adaptive Equipment: Reacher Additional Comments: plans to stay with his 72 yo father at d/c; cousin will also be helping; discussed AE and DME for ADLs as pt will need to be fairly independent at d/c      Prior Functioning/Environment Level of Independence: Independent             OT Diagnosis: Acute pain   OT Problem List: Impaired balance (sitting and/or standing);Decreased activity tolerance;Decreased knowledge of use of DME or AE;Decreased knowledge of precautions;Pain   OT Treatment/Interventions: Self-care/ADL training;DME and/or AE instruction;Therapeutic activities;Patient/family education;Balance training    OT Goals(Current goals can be found in the care plan section) Acute Rehab OT Goals Patient Stated Goal: No pain OT Goal Formulation: With patient Time For  Goal Achievement: 04/29/15 Potential to Achieve Goals: Good ADL Goals Pt Will Perform Grooming: with modified independence;standing Pt Will Perform Upper Body Bathing: with modified independence;sitting Pt Will Perform Lower Body Bathing: with modified independence;sit to/from stand;with adaptive equipment Pt Will Perform Upper Body Dressing: with modified independence;sitting Pt Will Perform Lower Body Dressing: with modified independence;with adaptive equipment;sit to/from stand Pt Will Transfer to Toilet: with modified independence;ambulating;bedside commode Pt Will Perform Toileting - Clothing Manipulation and hygiene: with modified independence;sit to/from stand;sitting/lateral leans Pt Will Perform Tub/Shower Transfer: Tub transfer;ambulating;3 in 1;rolling walker  OT Frequency: Min 2X/week   Barriers to D/C:            Co-evaluation              End of Session Equipment Utilized During Treatment: Back brace Nurse Communication: Other (comment) (10/10 pain)  Activity Tolerance: Patient limited by pain;Patient tolerated treatment well Patient left: in bed;with call bell/phone within reach;with bed alarm set;with SCD's reapplied   Time: 3532-9924 OT Time Calculation (min): 21 min Charges:  OT General Charges $OT Visit: 1 Procedure OT Evaluation $Initial OT Evaluation Tier I: 1 Procedure G-Codes:    Hortencia Pilar 05/17/15, 9:45 AM

## 2015-04-22 NOTE — Progress Notes (Signed)
     Subjective: 2 Days Post-Op Procedure(s) (LRB): T12 to L1 fusion (Extension of Previous Fusion L2-S1 to T12-S1), Right Transforaminal lumbar interbody fusion, Posterior Fusion T12 to L1, with Pedicle screws, allograft, local bone graft, and  Vivigen (N/A) Awake, alert and oriented x 4. Voiding without difficulty. Notes left thigh numbness and dysestheia, L2 or L3 Distribution anterior thigh and left groin. Has stood and walking in the hall for a short distance. Patient reports pain as moderate.    Objective:   VITALS:  Temp:  [98.6 F (37 C)-100 F (37.8 C)] 98.6 F (37 C) (10/27 1742) Pulse Rate:  [66-77] 73 (10/27 1742) Resp:  [18-20] 20 (10/27 1742) BP: (126-146)/(57-85) 140/82 mmHg (10/27 1742) SpO2:  [94 %-95 %] 94 % (10/27 1742)  ABD soft Neurovascular intact Sensation intact distally Intact pulses distally Dorsiflexion/Plantar flexion intact Incision: dressing C/D/I, no drainage and Dressing changed, requested shower tomorrow, this is okay. Left LE with anesthesia/dysesthesia left L2 or L3, motor is normal.   LABS  Recent Labs  04/21/15 0444  HGB 12.1*  WBC 12.0*  PLT 151    Recent Labs  04/20/15 0608 04/21/15 0444  NA 137 138  K 4.0 4.3  CL 102 102  CO2 26 24  BUN 18 17  CREATININE 1.08 1.05  GLUCOSE 110* 172*   No results for input(s): LABPT, INR in the last 72 hours.   Assessment/Plan: 2 Days Post-Op Procedure(s) (LRB): T12 to L1 fusion (Extension of Previous Fusion L2-S1 to T12-S1), Right Transforaminal lumbar interbody fusion, Posterior Fusion T12 to L1, with Pedicle screws, allograft, local bone graft, and  Vivigen (N/A) Left LE dysesthesia r/o hardware impingement on the left L2 or L3 nerve roots.    Advance diet Up with therapy Plan for discharge tomorrow Discharge home with home health  Allow to shower tomorrow. Start medrol dose pak Start lyrica Obtain CT Scan to assess hardware placement. Assess if there is left sided screw  impingement at either the T12 or L1 levels.  Kamron Portee E 04/22/2015, 7:08 PM

## 2015-04-22 NOTE — Progress Notes (Signed)
PT Cancellation Note  Patient Details Name: Jeff Lopez MRN: [id number] DOB: [birth year]   Cancelled Treatment:    Reason Eval/Treat Not Completed: Fatigue/lethargy limiting ability to participate; just now able to rest after getting pain controlled.  Pt requests PT return this afternoon.  Will return later as time permits.   Celita Aron,CYNDI 04/22/2015, [place], [place] 04/22/2015

## 2015-04-22 NOTE — Progress Notes (Signed)
Pt is alert and oriented, resting in his bed with no complain. i agree with previous nurse's assessment

## 2015-04-23 LAB — CBC WITH DIFFERENTIAL/PLATELET
BASOS PCT: 0 %
Basophils Absolute: 0 10*3/uL (ref 0.0–0.1)
Eosinophils Absolute: 0 10*3/uL (ref 0.0–0.7)
Eosinophils Relative: 0 %
HEMATOCRIT: 37.7 % — AB (ref 39.0–52.0)
HEMOGLOBIN: 13.2 g/dL (ref 13.0–17.0)
LYMPHS ABS: 0.7 10*3/uL (ref 0.7–4.0)
LYMPHS PCT: 6 %
MCH: 33.5 pg (ref 26.0–34.0)
MCHC: 35 g/dL (ref 30.0–36.0)
MCV: 95.7 fL (ref 78.0–100.0)
MONOS PCT: 8 %
Monocytes Absolute: 1 10*3/uL (ref 0.1–1.0)
NEUTROS ABS: 10.2 10*3/uL — AB (ref 1.7–7.7)
NEUTROS PCT: 86 %
Platelets: 164 10*3/uL (ref 150–400)
RBC: 3.94 MIL/uL — ABNORMAL LOW (ref 4.22–5.81)
RDW: 12.1 % (ref 11.5–15.5)
WBC: 11.8 10*3/uL — ABNORMAL HIGH (ref 4.0–10.5)

## 2015-04-23 MED ORDER — METHYLPREDNISOLONE 4 MG PO TBPK: ORAL_TABLET | ORAL | Status: DC

## 2015-04-23 MED ORDER — SENNOSIDES-DOCUSATE SODIUM 8.6-50 MG PO TABS: 1.0000 | ORAL_TABLET | Freq: Every evening | ORAL | Status: DC | PRN

## 2015-04-23 MED ORDER — PREGABALIN 75 MG PO CAPS: 75.0000 mg | ORAL_CAPSULE | Freq: Two times a day (BID) | ORAL | Status: DC

## 2015-04-23 MED ORDER — OXYCODONE HCL 20 MG PO TABS
20.0000 mg | ORAL_TABLET | ORAL | Status: DC | PRN
Start: 2015-04-23 — End: 2015-09-10

## 2015-04-23 NOTE — Progress Notes (Signed)
Pt discharged home with father. Brace applied. IV removed. Discharge instructions and prescriptions provided. Transported to exit via wheelchair and volunteer services at 1150. [name]

## 2015-04-23 NOTE — Progress Notes (Signed)
PT Cancellation Note  Patient Details Name: Jeff Lopez MRN: [id number] DOB: 1950-08-15   Cancelled Treatment:    Reason Eval/Treat Not Completed: Patient declined, no reason specified.  I just want to take a shower, I've walked, I've had 6 back surgeries.  I don't need this today.    Mirza Fessel, Tessie Fass 04/23/2015, 9:49 AM

## 2015-04-23 NOTE — Care Management Important Message (Signed)
Important Message  Patient Details  Name: Jeff Lopez MRN: [id number] Date of Birth: [birth year]   Medicare Important Message Given:  Yes-second notification given    Delorse Lek 04/23/2015, 1:19 PM

## 2015-04-23 NOTE — Discharge Summary (Signed)
Physician Discharge Summary      Patient ID: Jeff Lopez MRN: [id number] DOB/AGE: [birth year] 64 y.o.  Admit date: 04/20/2015 Discharge date: 04/23/2015  Admission Diagnoses:  Principal Problem:   Spinal stenosis, lumbar region, with neurogenic claudication Active Problems:   Spondylolisthesis of lumbar region   Degenerative disc disease, lumbar   Discharge Diagnoses:  Same  Past Medical History  Diagnosis Date  . HYPERLIPIDEMIA 04/09/2007  . HYPERTENSION, BENIGN 04/19/2010  . GERD 04/09/2007  . RENAL CYST 05/27/2010  . OSTEOARTHRITIS, LUMBAR SPINE 04/09/2007  . LUMBAR DISC DISORDER 05/27/2010  . Headache(784.0)   . CAD, NATIVE VESSEL 04/19/2010    nonobstructive by cath 2007:  oLAD 20-30%, mLAD 50%, pCFX 20-30%, oAVCFX 20-30%, L renal art 50%;  normal LVF  . Nerve damage     Left leg  . Spinal headache     After having back surgery in 2014  . Hypothyroidism   . Hypothyroidism     previous hyperthyroidism, s/p I-131    Surgeries: Procedure(s): T12 to L1 fusion (Extension of Previous Fusion L2-S1 to T12-S1), Right Transforaminal lumbar interbody fusion, Posterior Fusion T12 to L1, with Pedicle screws, allograft, local bone graft, and  Vivigen on 04/20/2015   Consultants:    Discharged Condition: Improved  Hospital Course: TARVIS BLOSSOM is an 64 y.o. male who was admitted 04/20/2015 with a chief complaint of No chief complaint on file. , and found to have a diagnosis of Spinal stenosis, lumbar region, with neurogenic claudication.  He was brought to the operating room on 04/20/2015 and underwent the above named procedures.    He was given perioperative antibiotics:  Anti-infectives    Start     Dose/Rate Route Frequency Ordered Stop   04/20/15 1700  ceFAZolin (ANCEF) IVPB 1 g/50 mL premix     1 g 100 mL/hr over 30 Minutes Intravenous Every 8 hours 04/20/15 1613 04/21/15 0141   04/20/15 0500  ceFAZolin (ANCEF) IVPB 2 g/50 mL premix     2 g 100 mL/hr  over 30 Minutes Intravenous To ShortStay [facility] 04/19/15 1343 04/20/15 1157     Post op day #1 awake alert and oriented x 4. Foley discontinued and he was able to void without difficulty. TLSO fitted in the afternoon and he was started on PT and OT. Diet was able to be advance quickly with good return of bowel function, flatus. Hgb 12.4 post op. Narcotic medications adjusted. Tolerated a po diet. POD#2 he noted left thigh and groin burning discomfort and numbness. CT of the lumbar spine showed new screws at T12 and L1 in good position and alignment, old left L2 screw with minimal encroachment of the left lateral recess potentially  Near the left L2 nerve root. This was likely secondary to resection of bone off the medial L2 pedicle causing some uncovering of the medial part of the L2 pedicle screw as the screw has been unchanged for almost 2 1/2 years. Placed on a  Steroid dose pak and lyrica as previous use of gabapentin caused stomach upset. POD#3 VSS, Hgb 13. Taking and tolerating po narcotics and nourishment. Incision Dry  Without erythrema or fluctuance. Allowed to shower with dressing change. Discharged home on POD#3. All questions were answered. The CT scan findings discussed with Mr. Augusto Garbe. No recommendation for further intervention expected Discomfort is a L2 neuritis associated with the recent hemilaminectomy and foraminotomy over the left sided L2 nerve root.   He was given sequential compression devices and early ambulation for DVT  prophylaxis.  He benefited maximally from their hospital stay and there were no complications.    Recent vital signs:  Filed Vitals:   04/23/15 0929  BP: 141/78  Pulse: 76  Temp: 98.7 F (37.1 C)  Resp: 16    Recent laboratory studies:  Results for orders placed or performed during the hospital encounter of 12/19/92  Basic metabolic panel  Result Value Ref Range   Sodium 137 135 - 145 mmol/L   Potassium 4.0 3.5 - 5.1 mmol/L   Chloride 102 101 -  111 mmol/L   CO2 26 22 - 32 mmol/L   Glucose, Bld 110 (H) 65 - 99 mg/dL   BUN 18 6 - 20 mg/dL   Creatinine, Ser 1.08 0.61 - 1.24 mg/dL   Calcium 8.6 (L) 8.9 - 10.3 mg/dL   GFR calc non Af Amer >60 >60 mL/min   GFR calc Af Amer >60 >60 mL/min   Anion gap 9 5 - 15  CBC  Result Value Ref Range   WBC 12.0 (H) 4.0 - 10.5 K/uL   RBC 3.72 (L) 4.22 - 5.81 MIL/uL   Hemoglobin 12.1 (L) 13.0 - 17.0 g/dL   HCT 36.5 (L) 39.0 - 52.0 %   MCV 98.1 78.0 - 100.0 fL   MCH 32.5 26.0 - 34.0 pg   MCHC 33.2 30.0 - 36.0 g/dL   RDW 12.6 11.5 - 15.5 %   Platelets 151 150 - 400 K/uL  Basic Metabolic Panel  Result Value Ref Range   Sodium 138 135 - 145 mmol/L   Potassium 4.3 3.5 - 5.1 mmol/L   Chloride 102 101 - 111 mmol/L   CO2 24 22 - 32 mmol/L   Glucose, Bld 172 (H) 65 - 99 mg/dL   BUN 17 6 - 20 mg/dL   Creatinine, Ser 1.05 0.61 - 1.24 mg/dL   Calcium 8.0 (L) 8.9 - 10.3 mg/dL   GFR calc non Af Amer >60 >60 mL/min   GFR calc Af Amer >60 >60 mL/min   Anion gap 12 5 - 15  CBC with Differential/Platelet  Result Value Ref Range   WBC 11.8 (H) 4.0 - 10.5 K/uL   RBC 3.94 (L) 4.22 - 5.81 MIL/uL   Hemoglobin 13.2 13.0 - 17.0 g/dL   HCT 37.7 (L) 39.0 - 52.0 %   MCV 95.7 78.0 - 100.0 fL   MCH 33.5 26.0 - 34.0 pg   MCHC 35.0 30.0 - 36.0 g/dL   RDW 12.1 11.5 - 15.5 %   Platelets 164 150 - 400 K/uL   Neutrophils Relative % 86 %   Neutro Abs 10.2 (H) 1.7 - 7.7 K/uL   Lymphocytes Relative 6 %   Lymphs Abs 0.7 0.7 - 4.0 K/uL   Monocytes Relative 8 %   Monocytes Absolute 1.0 0.1 - 1.0 K/uL   Eosinophils Relative 0 %   Eosinophils Absolute 0.0 0.0 - 0.7 K/uL   Basophils Relative 0 %   Basophils Absolute 0.0 0.0 - 0.1 K/uL    Discharge Medications:     Medication List    STOP taking these medications        gabapentin 300 MG capsule  Commonly known as:  NEURONTIN      TAKE these medications        levothyroxine 100 MCG tablet  Commonly known as:  SYNTHROID, LEVOTHROID  Take 1 tablet (100 mcg  total) by mouth daily.     losartan-hydrochlorothiazide 100-12.5 MG tablet  Commonly known as:  HYZAAR  take 1 tablet by mouth once daily     methylPREDNISolone 4 MG Tbpk tablet  Commonly known as:  MEDROL DOSEPAK  Take one tablet qid on Saturday     methylPREDNISolone 4 MG Tbpk tablet  Commonly known as:  MEDROL DOSEPAK  Take 2 tablets po at hs on Sunday night.  Start taking on:  04/24/2015     metoprolol succinate 25 MG 24 hr tablet  Commonly known as:  TOPROL-XL  take 1 tablet by mouth once daily     Oxycodone HCl 20 MG Tabs  take 1 tablet by mouth every 6 hours if needed for pain     Oxycodone HCl 20 MG Tabs  Take 1 tablet (20 mg total) by mouth every 4 (four) hours as needed for severe pain.     pantoprazole 40 MG tablet  Commonly known as:  PROTONIX  Take 40 mg by mouth 2 (two) times daily. Before meals     potassium chloride 10 MEQ tablet  Commonly known as:  K-DUR  Take 1 tablet (10 mEq total) by mouth daily.     pregabalin 75 MG capsule  Commonly known as:  LYRICA  Take 1 capsule (75 mg total) by mouth 2 (two) times daily.     senna-docusate 8.6-50 MG tablet  Commonly known as:  Senokot-S  Take 1 tablet by mouth at bedtime as needed for mild constipation.        Diagnostic Studies: Dg Thoracolumabar Spine  04/20/2015  CLINICAL DATA:  Patient with T12-L2, extension of previous posterior fusion hardware. EXAM: THORACOLUMBAR SPINE 1V COMPARISON:  Fluoroscopic image [id number] FINDINGS: Four intraoperative fluoroscopic images were submitted for interpretation. Posterior fusion hardware is demonstrated from T12-L3 and from L2-S1. No definite evidence for associated acute abnormality. IMPRESSION: Multiple fluoroscopic images demonstrate posterior lumbar spinal fusion hardware. Electronically Signed   By: Lovey Newcomer M.D.   On: 04/20/2015 13:50   Ct Lumbar Spine Wo Contrast  04/22/2015  CLINICAL DATA:  Lumbar spine surgery yesterday with fusion at T12-L1 and L1-2.  Left L2 or L3 radicular dysesthesias. EXAM: CT LUMBAR SPINE WITHOUT CONTRAST TECHNIQUE: Multidetector CT imaging of the lumbar spine was performed without intravenous contrast administration. Multiplanar CT image reconstructions were also generated. COMPARISON:  One-view abdomen 04/20/2015. MRI of the lumbar spine 12/21/2014. CT of the abdomen and pelvis 07/12/2013 FINDINGS: Lumbar fusion at L3-4, lumbar fusion at L3-4 and L4-5 is solid. There is no definite bridging bone at the L5-S1 level. The hardware is intact L3-S1. Interval extension of fusion with pedicle screws extending superiorly to include T12, L1, and L2. Discectomy is evident at L1-2 and L2-3 with graft material contained inside the disc space at both levels. Slight retrolisthesis is evident at L1-2. The left pedicle screw at L2 reaches the pedicle and extends into the left recess, potentially affecting the left L2 nerve root. The screw is stable in location. The T12 and L1 screws are well contained within the bone bilaterally. Moderate left foraminal stenosis at L1-2 is stable. The right foramen ectomy is noted. The central canal and foramina patent at T12-L1. L1-2: Bilateral laminectomies and a right foraminotomy are noted. Mild left foraminal narrowing is stable to slightly worse. L2-3: A left laminectomy and foraminotomy is present without residual or recurrent stenosis. L3-4: A wide laminectomy is noted. The foramina are patent bilaterally. L4-5: Fusion is solid. A wide laminectomy is noted. The foramina are widely patent. L5-S1: The central canal is decompressed. The foramina are patent bilaterally. IMPRESSION:  1. Extension of fusion superiorly to include T12 L1 and L1-2. 2. Right mastectomy and laminectomy at L1-2. Stable to slight increase in left foraminal narrowing at L1-2. 3. The the left L2 pedicle screw breaches the medial cortex, but is unchanged from prior exams. 4. Decompression laminectomies in the lower lumbar spine without residual or  recurrent stenosis. 5. The hardware is otherwise intact. Electronically Signed   By: San Morelle M.D.   On: 04/22/2015 12:48   Dg Abd Portable 1v  04/20/2015  CLINICAL DATA:  Postop, missing metallic bolt EXAM: PORTABLE ABDOMEN - 1 VIEW COMPARISON:  Prior intraoperative film same day FINDINGS: There is normal small bowel gas pattern. Postcholecystectomy surgical clips are noted. Again noted metallic fixation rods and screws from T12-S1 level. The alignment is preserved. No definite evidence of retained metallic foreign body. IMPRESSION: Stable metallic fixation rods and screws within spine. No definite evidence of retained metallic foreign body. These results were called by telephone at the time of interpretation on 04/20/2015 at 3:47 pm to Dr. Basil Dess , who verbally acknowledged these results. Electronically Signed   By: Lahoma Crocker M.D.   On: 04/20/2015 15:49   Dg C-arm Gt 120 Min  04/20/2015  CLINICAL DATA:  Patient with T12-L2, extension of previous posterior fusion hardware. EXAM: THORACOLUMBAR SPINE 1V COMPARISON:  Fluoroscopic image [id number] FINDINGS: Four intraoperative fluoroscopic images were submitted for interpretation. Posterior fusion hardware is demonstrated from T12-L3 and from L2-S1. No definite evidence for associated acute abnormality. IMPRESSION: Multiple fluoroscopic images demonstrate posterior lumbar spinal fusion hardware. Electronically Signed   By: Lovey Newcomer M.D.   On: 04/20/2015 13:50   Dg Or Local Abdomen  04/20/2015  CLINICAL DATA:  Post T12-L2 fusion, missing bolt EXAM: OR LOCAL ABDOMEN COMPARISON:  None. FINDINGS: Single frontal view of the lumbar and sacral spine submitted. There is metallic fusion from E08 to L3 and from L2-S1 vertebral bodies. Four metallic fixation rods are noted. Fourteen fixation metallic screws are noted. A crossbar is noted at L2 level. There is partially visualized a metallic screw overlying left iliac bone. IMPRESSION: There is  metallic fusion from X44 to L3 and from L2-S1 vertebral bodies. Four metallic fixation rods are noted. Fourteen fixation metallic screws are noted. A crossbar is noted at L2 level. There is partially visualized a metallic screw overlying left iliac bone. Electronically Signed   By: Lahoma Crocker M.D.   On: 04/20/2015 13:28    Disposition: 01-Home or Self Care      Discharge Instructions    Call MD / Call 911    Complete by:  As directed   If you experience chest pain or shortness of breath, CALL 911 and be transported to the hospital emergency room.  If you develope a fever above 101 F, pus (white drainage) or increased drainage or redness at the wound, or calf pain, call your surgeon's office.     Constipation Prevention    Complete by:  As directed   Drink plenty of fluids.  Prune juice may be helpful.  You may use a stool softener, such as Colace (over the counter) 100 mg twice a day.  Use MiraLax (over the counter) for constipation as needed.     Diet - low sodium heart healthy    Complete by:  As directed      Discharge instructions    Complete by:  As directed   Call if there is increasing drainage, fever greater than 101.5, severe head aches,  and worsening nausea or light sensitivity. If shortness of breath, bloody cough or chest tightness or pain go to an emergency room. No lifting greater than 10 lbs. Avoid bending, stooping and twisting. Use brace when sitting and out of bed even to go to bathroom. Walk in house for first 2 weeks then may start to get out slowly increasing distances up to one half mile by 4-6 weeks post op. After 5 days may shower and change dressing following bathing with shower.When bathing remove the brace shower and replace brace before getting out of the shower. If drainage, keep dry dressing and do not bathe the incision, use an moisture impervious dressing. Please call and return for scheduled follow up appointment 2 weeks from the time of surgery.      Driving restrictions    Complete by:  As directed   No driving for 2 weeks     Increase activity slowly as tolerated    Complete by:  As directed      Lifting restrictions    Complete by:  As directed   No lifting for 8 weeks           Follow-up Information    Follow up with Onna Nodal E, MD In 2 weeks.   Specialty:  Orthopedic Surgery   Why:  For wound re-check   Contact information:   [place] [address] [phone]        Signed: [name] 04/23/2015, 11:25 AM

## 2015-04-29 ENCOUNTER — Other Ambulatory Visit: Payer: BLUE CROSS/BLUE SHIELD

## 2015-04-29 ENCOUNTER — Ambulatory Visit: Payer: BC Managed Care – PPO | Admitting: Hematology and Oncology

## 2015-04-29 ENCOUNTER — Encounter (HOSPITAL_COMMUNITY): Payer: Self-pay | Admitting: Specialist

## 2015-05-06 ENCOUNTER — Ambulatory Visit: Payer: BLUE CROSS/BLUE SHIELD | Admitting: Hematology and Oncology

## 2015-05-06 ENCOUNTER — Other Ambulatory Visit: Payer: BLUE CROSS/BLUE SHIELD

## 2015-07-15 DIAGNOSIS — M5136 Other intervertebral disc degeneration, lumbar region: Secondary | ICD-10-CM | POA: Diagnosis not present

## 2015-07-15 DIAGNOSIS — M5416 Radiculopathy, lumbar region: Secondary | ICD-10-CM | POA: Diagnosis not present

## 2015-07-17 ENCOUNTER — Other Ambulatory Visit: Payer: Self-pay | Admitting: Cardiovascular Disease

## 2015-07-27 ENCOUNTER — Ambulatory Visit: Payer: PPO | Attending: Specialist | Admitting: Physical Therapy

## 2015-07-27 DIAGNOSIS — M6289 Other specified disorders of muscle: Secondary | ICD-10-CM | POA: Diagnosis not present

## 2015-07-27 DIAGNOSIS — M545 Low back pain, unspecified: Secondary | ICD-10-CM

## 2015-07-27 DIAGNOSIS — R29898 Other symptoms and signs involving the musculoskeletal system: Secondary | ICD-10-CM

## 2015-07-27 DIAGNOSIS — M5386 Other specified dorsopathies, lumbar region: Secondary | ICD-10-CM

## 2015-07-27 DIAGNOSIS — R198 Other specified symptoms and signs involving the digestive system and abdomen: Secondary | ICD-10-CM | POA: Diagnosis not present

## 2015-07-27 DIAGNOSIS — M2560 Stiffness of unspecified joint, not elsewhere classified: Secondary | ICD-10-CM | POA: Diagnosis not present

## 2015-07-27 DIAGNOSIS — R2681 Unsteadiness on feet: Secondary | ICD-10-CM | POA: Diagnosis not present

## 2015-07-27 NOTE — Patient Instructions (Signed)
   Kristoffer Leamon PT, DPT, LAT, ATC  [facility] Phone: [phone]     

## 2015-07-27 NOTE — Therapy (Signed)
[place], [place], [address] Phone: [phone]   Fax:  [phone]  Physical Therapy Evaluation  Patient Details  Name: Jeff Lopez MRN: [id number] Date of Birth: 12/04/1950 Referring Provider: Basil Dess Md  Encounter Date: 07/27/2015      PT End of Session - 07/27/15 0943    Visit Number 1   Number of Visits 16   Date for PT Re-Evaluation 09/21/15   PT Start Time 0930   PT Stop Time 1018   PT Time Calculation (min) 48 min   Activity Tolerance Patient tolerated treatment well   Behavior During Therapy [facility] for tasks assessed/performed      Past Medical History  Diagnosis Date  . HYPERLIPIDEMIA 04/09/2007  . HYPERTENSION, BENIGN 04/19/2010  . GERD 04/09/2007  . RENAL CYST 05/27/2010  . OSTEOARTHRITIS, LUMBAR SPINE 04/09/2007  . LUMBAR DISC DISORDER 05/27/2010  . Headache(784.0)   . CAD, NATIVE VESSEL 04/19/2010    nonobstructive by cath 2007:  oLAD 20-30%, mLAD 50%, pCFX 20-30%, oAVCFX 20-30%, L renal art 50%;  normal LVF  . Nerve damage     Left leg  . Spinal headache     After having back surgery in 2014  . Hypothyroidism   . Hypothyroidism     previous hyperthyroidism, s/p I-131    Past Surgical History  Procedure Laterality Date  . Spine surgery  2006    C-spine surgery x 2  . Neck surgery      X 2  . Lumbar disc surgery  08/06/2012    L3 & L4  . Foot fracture surgery    . Lumbar laminectomy/decompression microdiscectomy Left 08/10/2012    Procedure: Dura Repair Left Side L2-L3;  Surgeon: Jeff Oto, MD;  Location: [place];  Service: Orthopedics;  Laterality: Left;  Wilson Frame, Sliding table, dura repair kit, microscope  . Ercp Jeff Lopez 07/14/2013    Procedure: ENDOSCOPIC RETROGRADE CHOLANGIOPANCREATOGRAPHY (ERCP);  Surgeon: Jeff Banister, MD;  Location: [facility];  Service: Endoscopy;  Laterality: Jeff Lopez;  . Cardiac catheterization  2007    Jeff Lopez  . Fracture surgery     . Back surgery  2012,2014  . Lumbar disc surgery  11/11/2015    L1 L2    Jeff NITKA  . Lumbar laminectomy Jeff Lopez 11/10/2013    Procedure: Left L1-2 far lateral approach to excise herniated nucleus pulposus;  Surgeon: Jeff Oto, MD;  Location: [place];  Service: Orthopedics;  Laterality: Jeff Lopez;  . Colonoscopy w/ polypectomy    . Cholecystectomy    . Cholecystectomy Jeff Lopez 07/13/2013    Procedure: LAPAROSCOPIC CHOLECYSTECTOMY WITH INTRAOPERATIVE CHOLANGIOGRAM;  Surgeon: Jeff Medal, MD;  Location: [facility];  Service: General;  Laterality: Jeff Lopez;  . Eye surgery Bilateral     Cataract removal  . Lumbar fusion Jeff Lopez 04/20/2015    Procedure: T12 to L1 fusion (Extension of Previous Fusion L2-S1 to T12-S1), Right Transforaminal lumbar interbody fusion, Posterior Fusion T12 to L1, with Pedicle screws, allograft, local bone graft, and  Vivigen;  Surgeon: Jeff Oto, MD;  Location: [place];  Service: Orthopedics;  Laterality: Jeff Lopez;    There were no vitals filed for this visit.  Visit Diagnosis:  Midline low back pain without sciatica - Plan: PT plan of care cert/re-cert  Weakness of both hips - Plan: PT plan of care cert/re-cert  Unsteadiness - Plan: PT plan of care cert/re-cert  Decreased ROM of lumbar spine - Plan: PT plan of care  cert/re-cert  Abdominal weakness - Plan: PT plan of care cert/re-cert      Subjective Assessment - 07/27/15 0944    Subjective pt is a 65 y.o M [name] on 04/20/2015 he reports that it is his 5th low back surgery. He states that he is feeling weak in the core and states he wears the brace all the time except for at night and when he showers. He reports weakness in the legs especially with squating. He does report that the pain is gradually decling and getting better.    Pertinent History 5 low back surgeries   Limitations Lifting;Sitting;Standing;Walking;House hold activities   How long can you sit comfortably? couple hours   How long can you stand comfortably? 2-3  hours with brace: without the brace 1 -2 hours   How long can you walk comfortably? 2-3 hours with brace: 2-3 hours wihtout hte brace   Diagnostic tests MRI on 12/21/2014   Patient Stated Goals to get strength back, and get away from wearing the brace   Currently in Pain? Yes   Pain Score 4    Pain Location Back   Pain Orientation Lower;Left;Mid   Pain Descriptors / Indicators Aching;Sore;Tightness   Pain Type Surgical pain   Pain Radiating Towards proximal thigh pain thats constant   Pain Onset More than a month ago   Pain Frequency Intermittent   Aggravating Factors  twisting, bending forward, not wearing the brace   Pain Relieving Factors wearing the brace, laying on the back, heat/warm shower, pain medication            [facility] PT Assessment - 07/27/15 0937    Assessment   Medical Diagnosis s/p T11 - S1 Fusion   Referring Provider Basil Dess Md   Onset Date/Surgical Date 04/20/15   Hand Dominance Right   Next MD Visit 08/12/2015   Prior Therapy yes   Precautions   Precaution Comments no lifting over 10#   Restrictions   Weight Bearing Restrictions No   Balance Screen   Has the patient fallen in the past 6 months No   Has the patient had a decrease in activity level because of a fear of falling?  No   Is the patient reluctant to leave their home because of a fear of falling?  No   Home Environment   Living Environment Private residence   Living Arrangements Alone   Type of [place] to enter   Entrance Stairs-Number of Steps 30   Entrance Stairs-Rails Can reach both   [place] One level   [place] --  thoracolumbar brace   Prior Function   Level of Independence Independent;Independent with basic ADLs   Cognition   Overall Cognitive Status Within Functional Limits for tasks assessed   Observation/Other Assessments   Focus on Therapeutic Outcomes (FOTO)  45% limited  Predicted 41% limited   Posture/Postural Control    Posture/Postural Control Postural limitations   Postural Limitations Rounded Shoulders;Forward head;Decreased lumbar lordosis   ROM / Strength   AROM / PROM / Strength AROM;PROM;Strength   AROM   AROM Assessment Site Lumbar   Lumbar Flexion 50   Lumbar Extension 10   Lumbar - Right Side Bend 22   Lumbar - Left Side Bend 22   Strength   Strength Assessment Site Hip;Knee   Right/Left Hip Right;Left   Right Hip Flexion 4/5   Right Hip Extension 4-/5   Right Hip ABduction 3+/5  Right Hip ADduction 4+/5   Left Hip Flexion 4/5   Left Hip Extension 4-/5   Left Hip ABduction 3+/5   Left Hip ADduction 4+/5   Right/Left Knee Right;Left   Right Knee Flexion 4+/5   Right Knee Extension 4+/5   Left Knee Flexion 4+/5   Left Knee Extension 4+/5   Palpation   Palpation comment tendneress noted along bil parapsinals with increased soreness in the L                            PT Education - 07/27/15 1101    Education provided Yes   Education Details evaluation findings, POC, goals, HEP   Person(s) Educated Patient   Methods Explanation   Comprehension Verbalized understanding          PT Short Term Goals - 07/27/15 1250    PT SHORT TERM GOAL #1   Title pt will be I with inital HEP (08/24/2015)   Time 4   Period Weeks   Status New   PT SHORT TERM GOAL #2   Title pt will be able to verbalize and demonstrate techniques to reduce low back pain via postural awarness, proper lifting and carrying mechanics, and HEP (08/24/2015)   Time 4   Period Weeks   Status New           PT Long Term Goals - 07/27/15 1252    PT LONG TERM GOAL #1   Title pt will be I with all HEP as of last visit (09/21/2015)   Time 8   Period Weeks   Status New   PT LONG TERM GOAL #2   Title pt will improve bil hip strength to >/= 4/5 strength with </=2/10 pain to assist with safety with prolonged standing and walking activities (09/21/2015)   Time 8   Period Weeks   Status New   PT  LONG TERM GOAL #3   Title pt improve trunk flexion by >/= 10 degrees, and trunk extension by >/=5 degrees with </= 2/10 pain to assist with ADLS (2/44/9753)   Time 8   Period Weeks   Status New   PT LONG TERM GOAL #4   Title pt will be able to go >/= 6 hours without his brace with no report of feeling weak to promote core stability and functional endurance during ADLS (0/10/1100)   Time 8   Period Weeks   Status New   PT LONG TERM GOAL #5   Title pt will improve his FOTO score to >/= 59 points to demonstrate improvement in function (09/21/2015)   Time 8   Period Weeks   Status New               Plan - 07/27/15 1103    Clinical Impression Statement Darivs present to [facility]PP[facility] s/p T11-S1 fusion on 04/20/2015 with a hx of 5 back surgeries. He demonstrates limited trunk mobility in all planes. He currently wears a thoracolumbar brace and is able to don/doff it independenlty and wears it all the time except for sleeping and showering since the surgery. He exhibits signifcant weakness and limited endurance with core strength, and weakness in bil hips. He would benefit from physical therapy to improve trunk mobility and core strength by addressing the impairments listed.   based on pt age, limited lumbar mobility, weaknes of hips, core, abnormal posture, with a hx or 5 fusion surgeries, stable symptomology, he presents as a low complexity  evaluation   Pt will benefit from skilled therapeutic intervention in order to improve on the following deficits Pain;Improper body mechanics;Postural dysfunction;Decreased strength;Hypomobility;Decreased endurance;Decreased range of motion;Decreased activity tolerance;Impaired flexibility   Rehab Potential Good   PT Frequency 3x / week   PT Duration 8 weeks   PT Treatment/Interventions ADLs/Self Care Home Management;Cryotherapy;Electrical Stimulation;Iontophoresis 33m/ml Dexamethasone;Moist Heat;Therapeutic exercise;Manual techniques;Therapeutic activities;Dry  needling;Ultrasound;Passive range of motion;Patient/family education;Taping   PT Next Visit Plan assess and review HEP, core strengthening, light extenison exercise per script, hip strengthening, modalities PRN   PT Home Exercise Plan standing hip abduction/ extension, supine marching, pelvic tilt, prone on pillows, hamstring stretching   Consulted and Agree with Plan of Care Patient          G-Codes - [id number]    Functional Limitation Changing and maintaining body position   Changing and Maintaining Body Position Current Status ((V6720 At least 40 percent but less than 60 percent impaired, limited or restricted   Changing and Maintaining Body Position Goal Status ((N4709 At least 20 percent but less than 40 percent impaired, limited or restricted       Problem List Patient Active Problem List   Diagnosis Date Noted  . Spinal stenosis, lumbar region, with neurogenic claudication 04/21/2015    Class: Chronic  . Spondylolisthesis of lumbar region 04/21/2015    Class: Chronic  . Hypothyroidism following radioiodine therapy 03/10/2015  . Screening for prostate cancer 03/10/2015  . Hemochromatosis 03/27/2014  . Other abnormal blood chemistry 02/12/2014  . Choledocholithiasis 07/14/2013  . Nonspecific (abnormal) findings on radiological and other examination of biliary tract 07/13/2013  . Calculus of gallbladder with acute cholecystitis, without mention of obstruction 07/13/2013  . Calculus of bile duct without mention of cholecystitis or obstruction 07/13/2013  . Abdominal pain 07/12/2013  . Neck pain on left side 05/30/2013  . Quit smoking 11/28/2012  . Postoperative CSF leak 08/10/2012    Class: Acute  . Degenerative disc disease, lumbar 08/06/2012    Class: Chronic  . HNP (herniated nucleus pulposus), lumbar 08/06/2012    Class: Acute  . Diabetes [place] 06/10/2012  . COPD GOLD 0 02/09/2012  . Chest pain at rest 02/06/2012  . Allergic rhinitis, cause unspecified  02/06/2012  . Radiculopathy of leg 11/05/2010  . Cough 08/23/2010  . RENAL CYST 05/27/2010  . LUMBAR DISC DISORDER 05/27/2010  . HYPERTENSION, BENIGN 04/19/2010  . CAD, NATIVE VESSEL 04/19/2010  . HYPERLIPIDEMIA 04/09/2007  . GERD 04/09/2007  . OSTEOARTHRITIS, LUMBAR SPINE 04/09/2007   KStarr LakePT, DPT, LAT, ATC  002/05/17 1:08 PM     [facility] [place] [address]Phone: [phone]  Fax:  [phone] Name: RGORDIE CRUMBYMRN: [id number] of Birth: 1Jan 20, 1952

## 2015-07-28 ENCOUNTER — Ambulatory Visit: Payer: PPO | Admitting: Physical Therapy

## 2015-08-02 ENCOUNTER — Ambulatory Visit: Payer: PPO | Attending: Specialist

## 2015-08-02 DIAGNOSIS — R2681 Unsteadiness on feet: Secondary | ICD-10-CM | POA: Insufficient documentation

## 2015-08-02 DIAGNOSIS — M2560 Stiffness of unspecified joint, not elsewhere classified: Secondary | ICD-10-CM | POA: Diagnosis not present

## 2015-08-02 DIAGNOSIS — M6289 Other specified disorders of muscle: Secondary | ICD-10-CM | POA: Diagnosis not present

## 2015-08-02 DIAGNOSIS — R29898 Other symptoms and signs involving the musculoskeletal system: Secondary | ICD-10-CM

## 2015-08-02 DIAGNOSIS — R198 Other specified symptoms and signs involving the digestive system and abdomen: Secondary | ICD-10-CM | POA: Insufficient documentation

## 2015-08-02 DIAGNOSIS — M545 Low back pain, unspecified: Secondary | ICD-10-CM

## 2015-08-02 DIAGNOSIS — M5386 Other specified dorsopathies, lumbar region: Secondary | ICD-10-CM

## 2015-08-02 NOTE — Therapy (Signed)
[place], [place], [address] Phone: [phone]   Fax:  [phone]  Physical Therapy Treatment  Patient Details  Name: Jeff Lopez MRN: [id number] Date of Birth: [birth year] Referring Provider: Basil Dess Md  Encounter Date: 08/02/2015      PT End of Session - 08/02/15 1059    Visit Number 2   Number of Visits 16   Date for PT Re-Evaluation 09/21/15   PT Start Time 8372   PT Stop Time 1112   PT Time Calculation (min) 57 min   Activity Tolerance Patient tolerated treatment well   Behavior During Therapy [facility] for tasks assessed/performed      Past Medical History  Diagnosis Date  . HYPERLIPIDEMIA 04/09/2007  . HYPERTENSION, BENIGN 04/19/2010  . GERD 04/09/2007  . RENAL CYST 05/27/2010  . OSTEOARTHRITIS, LUMBAR SPINE 04/09/2007  . LUMBAR DISC DISORDER 05/27/2010  . Headache(784.0)   . CAD, NATIVE VESSEL 04/19/2010    nonobstructive by cath 2007:  oLAD 20-30%, mLAD 50%, pCFX 20-30%, oAVCFX 20-30%, L renal art 50%;  normal LVF  . Nerve damage     Left leg  . Spinal headache     After having back surgery in 2014  . Hypothyroidism   . Hypothyroidism     previous hyperthyroidism, s/p I-131    Past Surgical History  Procedure Laterality Date  . Spine surgery  2006    C-spine surgery x 2  . Neck surgery      X 2  . Lumbar disc surgery  08/06/2012    L3 & L4  . Foot fracture surgery    . Lumbar laminectomy/decompression microdiscectomy Left 08/10/2012    Procedure: Dura Repair Left Side L2-L3;  Surgeon: [name], MD;  Location: [place];  Service: Orthopedics;  Laterality: Left;  Wilson Frame, Sliding table, dura repair kit, microscope  . Ercp N/A 07/14/2013    Procedure: ENDOSCOPIC RETROGRADE CHOLANGIOPANCREATOGRAPHY (ERCP);  Surgeon: [name], MD;  Location: [facility];  Service: Endoscopy;  Laterality: N/A;  . Cardiac catheterization  2007    Dr Loanne Drilling  . Fracture surgery     . Back surgery  2012,2014  . Lumbar disc surgery  11/11/2015    L1 L2    DR NITKA  . Lumbar laminectomy N/A 11/10/2013    Procedure: Left L1-2 far lateral approach to excise herniated nucleus pulposus;  Surgeon: [name], MD;  Location: [place];  Service: Orthopedics;  Laterality: N/A;  . Colonoscopy w/ polypectomy    . Cholecystectomy    . Cholecystectomy N/A 07/13/2013    Procedure: LAPAROSCOPIC CHOLECYSTECTOMY WITH INTRAOPERATIVE CHOLANGIOGRAM;  Surgeon: Shann Medal, MD;  [facility];  Service: General;  Laterality: N/A;  . Eye surgery Bilateral     Cataract removal  . Lumbar fusion N/A 04/20/2015    Procedure: T12 to L1 fusion (Extension of Previous Fusion L2-S1 to T12-S1), Right Transforaminal lumbar interbody fusion, Posterior Fusion T12 to L1, with Pedicle screws, allograft, local bone graft, and  Vivigen;  Surgeon: [name], MD;  Location: [place];  Service: Orthopedics;  Laterality: N/A;    There were no vitals filed for this visit.  Visit Diagnosis:  Midline low back pain without sciatica  Weakness of both hips  Unsteadiness  Decreased ROM of lumbar spine  Abdominal weakness      Subjective Assessment - 08/02/15 1025    Subjective Same as at eval . sore from use of brace  for longer than other  lumbar surgeries.    Currently in Pain? Yes   Pain Score 4    Pain Location Back   Pain Orientation Lower;Left;Mid   Pain Descriptors / Indicators Aching;Sore;Tightness   Pain Type Surgical pain   Multiple Pain Sites No                         [facility] Adult PT Treatment/Exercise - 08/02/15 1027    Lumbar Exercises: Stretches   Passive Hamstring Stretch 1 rep;60 seconds  RT and LT   Pelvic Tilt 5 reps;10 seconds   Pelvic Tilt Limitations verbal and tactile cues to get technique correct   Lumbar Exercises: Supine   Ab Set 10 reps   Glut Set 10 reps   Clam 10 reps  RT and LT   Heel Slides 10 reps  RT and LT   Bent Knee Raise 10 reps  RT  and LT   Lumbar Exercises: Quadruped   Other Quadruped Lumbar Exercises multifidus x 10 bilateral and x12 unilateral pelvic lift 1-2 inches  RT and LT   Knee/Hip Exercises: Aerobic   Nustep L5 LE only 6 min   Modalities   Modalities Moist Heat   Moist Heat Therapy   Number Minutes Moist Heat 14 Minutes   Moist Heat Location Lumbar Spine  prone per pt                PT Education - 08/02/15 1058    Education provided Yes   Education Details he needed cues for most exercises but did correctly post instruction. Added RT and LT pelvis lift  for multifidus added to durrent HEP sheet.    Person(s) Educated Patient   Methods Explanation;Demonstration;Tactile cues;Verbal cues   Comprehension Returned demonstration;Verbalized understanding          PT Short Term Goals - 07/27/15 1250    PT SHORT TERM GOAL #1   Title pt will be I with inital HEP (08/24/2015)   Time 4   Period Weeks   Status New   PT SHORT TERM GOAL #2   Title pt will be able to verbalize and demonstrate techniques to reduce low back pain via postural awarness, proper lifting and carrying mechanics, and HEP (08/24/2015)   Time 4   Period Weeks   Status New           PT Long Term Goals - 07/27/15 1252    PT LONG TERM GOAL #1   Title pt will be I with all HEP as of last visit (09/21/2015)   Time 8   Period Weeks   Status New   PT LONG TERM GOAL #2   Title pt will improve bil hip strength to >/= 4/5 strength with </=2/10 pain to assist with safety with prolonged standing and walking activities (09/21/2015)   Time 8   Period Weeks   Status New   PT LONG TERM GOAL #3   Title pt improve trunk flexion by >/= 10 degrees, and trunk extension by >/=5 degrees with </= 2/10 pain to assist with ADLS (7/48/2707)   Time 8   Period Weeks   Status New   PT LONG TERM GOAL #4   Title pt will be able to go >/= 6 hours without his brace with no report of feeling weak to promote core stability and functional endurance  during ADLS (8/67/5449)   Time 8   Period Weeks   Status New  PT LONG TERM GOAL #5   Title pt will improve his FOTO score to >/= 59 points to demonstrate improvement in function (09/21/2015)   Time 8   Period Weeks   Status New               Plan - 08/02/15 1100    Clinical Impression Statement [name] needed significant instruction for correct HEP but was able to do correctly after instruction. Will progress exercise next visit   PT Next Visit Plan  core strengthening, light extenison exercise per script, hip strengthening, modalities PRN   Consulted and Agree with Plan of Care Patient        Problem List Patient Active Problem List   Diagnosis Date Noted  . Spinal stenosis, lumbar region, with neurogenic claudication 04/21/2015    Class: Chronic  . Spondylolisthesis of lumbar region 04/21/2015    Class: Chronic  . Hypothyroidism following radioiodine therapy 03/10/2015  . Screening for prostate cancer 03/10/2015  . Hemochromatosis 03/27/2014  . Other abnormal blood chemistry 02/12/2014  . Choledocholithiasis 07/14/2013  . Nonspecific (abnormal) findings on radiological and other examination of biliary tract 07/13/2013  . Calculus of gallbladder with acute cholecystitis, without mention of obstruction 07/13/2013  . Calculus of bile duct without mention of cholecystitis or obstruction 07/13/2013  . Abdominal pain 07/12/2013  . Neck pain on left side 05/30/2013  . Quit smoking 11/28/2012  . Postoperative CSF leak 08/10/2012    Class: Acute  . Degenerative disc disease, lumbar 08/06/2012    Class: Chronic  . HNP (herniated nucleus pulposus), lumbar 08/06/2012    Class: Acute  . Diabetes ([place]) 06/10/2012  . COPD GOLD 0 02/09/2012  . Chest pain at rest 02/06/2012  . Allergic rhinitis, cause unspecified 02/06/2012  . Radiculopathy of leg 11/05/2010  . Cough 08/23/2010  . RENAL CYST 05/27/2010  . LUMBAR DISC DISORDER 05/27/2010  . HYPERTENSION, BENIGN  04/19/2010  . CAD, NATIVE VESSEL 04/19/2010  . HYPERLIPIDEMIA 04/09/2007  . GERD 04/09/2007  . OSTEOARTHRITIS, LUMBAR SPINE 04/09/2007    Darrel Hoover PT  08/02/2015, 11:01 AM  [facility] [address] [place], [place], [address] Phone: [phone]   Fax:  [phone]  Name: Jeff Lopez MRN: [id number] Date of Birth: 1951-05-05

## 2015-08-04 ENCOUNTER — Ambulatory Visit: Payer: PPO | Admitting: Physical Therapy

## 2015-08-04 DIAGNOSIS — M545 Low back pain, unspecified: Secondary | ICD-10-CM

## 2015-08-04 DIAGNOSIS — R198 Other specified symptoms and signs involving the digestive system and abdomen: Secondary | ICD-10-CM

## 2015-08-04 DIAGNOSIS — R2681 Unsteadiness on feet: Secondary | ICD-10-CM

## 2015-08-04 DIAGNOSIS — R29898 Other symptoms and signs involving the musculoskeletal system: Secondary | ICD-10-CM

## 2015-08-04 DIAGNOSIS — M5386 Other specified dorsopathies, lumbar region: Secondary | ICD-10-CM

## 2015-08-04 NOTE — Therapy (Signed)
[facility] [address] [place], [place], [address] Phone: [phone]   Fax:  [phone]  Physical Therapy Treatment  Patient Details  Name: Jeff Lopez MRN: [id number] Date of Birth: 04/16/1951 Referring Provider: James Nitka Md  Encounter Date: 08/04/2015      PT End of Session - 08/04/15 1031    Visit Number 3   Number of Visits 16   Date for PT Re-Evaluation 09/21/15   PT Start Time 0850   PT Stop Time 0945   PT Time Calculation (min) 55 min   Activity Tolerance Patient tolerated treatment well   Behavior During Therapy [facility] for tasks assessed/performed      Past Medical History  Diagnosis Date  . HYPERLIPIDEMIA 04/09/2007  . HYPERTENSION, BENIGN 04/19/2010  . GERD 04/09/2007  . RENAL CYST 05/27/2010  . OSTEOARTHRITIS, LUMBAR SPINE 04/09/2007  . LUMBAR DISC DISORDER 05/27/2010  . Headache(784.0)   . CAD, NATIVE VESSEL 04/19/2010    nonobstructive by cath 2007:  oLAD 20-30%, mLAD 50%, pCFX 20-30%, oAVCFX 20-30%, L renal art 50%;  normal LVF  . Nerve damage     Left leg  . Spinal headache     After having back surgery in 2014  . Hypothyroidism   . Hypothyroidism     previous hyperthyroidism, s/p I-131    Past Surgical History  Procedure Laterality Date  . Spine surgery  2006    C-spine surgery x 2  . Neck surgery      X 2  . Lumbar disc surgery  08/06/2012    L3 & L4  . Foot fracture surgery    . Lumbar laminectomy/decompression microdiscectomy Left 08/10/2012    Procedure: Dura Repair Left Side L2-L3;  Surgeon: James E Nitka, MD;  [facility]: [facility];  Service: Orthopedics;  Laterality: Left;  Wilson Frame, Sliding table, dura repair kit, microscope  . Ercp N/A 07/14/2013    Procedure: ENDOSCOPIC RETROGRADE CHOLANGIOPANCREATOGRAPHY (ERCP);  Surgeon: Daniel P Jacobs, MD;  Location: [facility];  Service: Endoscopy;  Laterality: N/A;  . Cardiac catheterization  2007    Dr Ellison  . Fracture surgery     . Back surgery  2012,2014  . Lumbar disc surgery  11/11/2015    L1 L2    DR NITKA  . Lumbar laminectomy N/A 11/10/2013    Procedure: Left L1-2 far lateral approach to excise herniated nucleus pulposus;  Surgeon: James E Nitka, MD;  Location: [facility];  Service: Orthopedics;  Laterality: N/A;  . Colonoscopy w/ polypectomy    . Cholecystectomy    . Cholecystectomy N/A 07/13/2013    Procedure: LAPAROSCOPIC CHOLECYSTECTOMY WITH INTRAOPERATIVE CHOLANGIOGRAM;  Surgeon: David H Newman, MD;  Location: [facility];  Service: General;  Laterality: N/A;  . Eye surgery Bilateral     Cataract removal  . Lumbar fusion N/A 04/20/2015    Procedure: T12 to L1 fusion (Extension of Previous Fusion L2-S1 to T12-S1), Right Transforaminal lumbar interbody fusion, Posterior Fusion T12 to L1, with Pedicle screws, allograft, local bone graft, and  Vivigen;  Surgeon: James E Nitka, MD;  Location: [facility];  Service: Orthopedics;  Laterality: N/A;    There were no vitals filed for this visit.  Visit Diagnosis:  Midline low back pain without sciatica  Weakness of both hips  Unsteadiness  Decreased ROM of lumbar spine  Abdominal weakness      Subjective Assessment - 08/04/15 0854    Subjective "I am feeling soreness everywhere, but I feel like it   is a good thing"    Currently in Pain? Yes   Pain Score 4    Pain Location Back   Pain Orientation Lower;Left;Mid   Pain Descriptors / Indicators Aching;Sore;Tightness   Pain Type Surgical pain   Pain Onset More than a month ago   Aggravating Factors  twisting, bending forward, not wearing the brace   Pain Relieving Factors wear the brace, laying on the back, heat/warm shower, pain medication                          [facility] Adult PT Treatment/Exercise - 08/04/15 0901    Lumbar Exercises: Stretches   Passive Hamstring Stretch 2 reps;30 seconds   Single Knee to Chest Stretch 2 reps;30 seconds  bil   Pelvic Tilt 5 reps;10 seconds   Piriformis Stretch 2  reps;30 seconds  bil   Lumbar Exercises: Aerobic   Stationary Bike Nu-Step L5 x 6 min   Lumbar Exercises: Seated   Sit to Stand 10 reps  from table   Lumbar Exercises: Supine   Bent Knee Raise 10 reps;2 seconds   Bridge 10 reps;1 second  x 2 sets, with ball between knees, with abdominal draw in   Bridge Limitations raising up only 2-3 inches to gently work on extension   Lumbar Exercises: Quadruped   Other Quadruped Lumbar Exercises multifidus x 10 bilateral and x12 unilateral pelvic lift 1-2 inches  RT and LT   Moist Heat Therapy   Number Minutes Moist Heat 10 Minutes   Moist Heat Location Lumbar Spine  with pt in prone   Manual Therapy   Manual Therapy Neural Stretch   Neural Stretch hamstring stretching with ankling pumping   for sciatic neural stretching                PT Education - 08/04/15 1031    Education Details HEP review   Person(s) Educated Patient   Methods Explanation   Comprehension Verbalized understanding          PT Short Term Goals - 08/04/15 1039    PT SHORT TERM GOAL #1   Title pt will be I with inital HEP (08/24/2015)   Time 4   Period Weeks   Status On-going   PT SHORT TERM GOAL #2   Title pt will be able to verbalize and demonstrate techniques to reduce low back pain via postural awarness, proper lifting and carrying mechanics, and HEP (08/24/2015)   Time 4   Period Weeks   Status On-going           PT Long Term Goals - 08/04/15 1039    PT LONG TERM GOAL #1   Title pt will be I with all HEP as of last visit (09/21/2015)   Time 8   Period Weeks   Status On-going   PT LONG TERM GOAL #2   Title pt will improve bil hip strength to >/= 4/5 strength with </=2/10 pain to assist with safety with prolonged standing and walking activities (09/21/2015)   Time 8   Period Weeks   Status On-going   PT LONG TERM GOAL #3   Title pt improve trunk flexion by >/= 10 degrees, and trunk extension by >/=5 degrees with </= 2/10 pain to assist with  ADLS (9/83/3825)   Time 8   Period Weeks   Status On-going   PT LONG TERM GOAL #4   Title pt will be able to go >/= 6 hours  without his brace with no report of feeling weak to promote core stability and functional endurance during ADLS (09/21/2015)   Time 8   Period Weeks   Status On-going   PT LONG TERM GOAL #5   Title pt will improve his FOTO score to >/= 59 points to demonstrate improvement in function (09/21/2015)   Time 8   Period Weeks   Status On-going               Plan - 08/04/15 1037    Clinical Impression Statement Mr. Alvira reports feeling sore since the last visit but that it seems to be more musculature in nature and probably due to his amount of previous inactivity. He was able to perform and complete all exercises given and reported the pirformis stretch gave him tremendous amount of relief with the tingling. Utlitized MHP following todays session to calm down additional sorenss from todays exercises.    PT Next Visit Plan  core strengthening, light extenison exercise per script, hip strengthening, modalities PRN, give pirifromis and hamstring stretch as HEP next visit   Consulted and Agree with Plan of Care Patient        Problem List Patient Active Problem List   Diagnosis Date Noted  . Spinal stenosis, lumbar region, with neurogenic claudication 04/21/2015    Class: Chronic  . Spondylolisthesis of lumbar region 04/21/2015    Class: Chronic  . Hypothyroidism following radioiodine therapy 03/10/2015  . Screening for prostate cancer 03/10/2015  . Hemochromatosis 03/27/2014  . Other abnormal blood chemistry 02/12/2014  . Choledocholithiasis 07/14/2013  . Nonspecific (abnormal) findings on radiological and other examination of biliary tract 07/13/2013  . Calculus of gallbladder with acute cholecystitis, without mention of obstruction 07/13/2013  . Calculus of bile duct without mention of cholecystitis or obstruction 07/13/2013  . Abdominal pain 07/12/2013   . Neck pain on left side 05/30/2013  . Quit smoking 11/28/2012  . Postoperative CSF leak 08/10/2012    Class: Acute  . Degenerative disc disease, lumbar 08/06/2012    Class: Chronic  . HNP (herniated nucleus pulposus), lumbar 08/06/2012    Class: Acute  . Diabetes ([facility]) 06/10/2012  . COPD GOLD 0 02/09/2012  . Chest pain at rest 02/06/2012  . Allergic rhinitis, cause unspecified 02/06/2012  . Radiculopathy of leg 11/05/2010  . Cough 08/23/2010  . RENAL CYST 05/27/2010  . LUMBAR DISC DISORDER 05/27/2010  . HYPERTENSION, BENIGN 04/19/2010  . CAD, NATIVE VESSEL 04/19/2010  . HYPERLIPIDEMIA 04/09/2007  . GERD 04/09/2007  . OSTEOARTHRITIS, LUMBAR SPINE 04/09/2007     PT, DPT, LAT, ATC  08/04/2015  10:41 AM     [facility] [address] [place], [place], [address] Phone: [phone]   Fax:  [phone]  Name: Akeem A Sarkis MRN: [id number] Date of Birth: 10/07/1950     

## 2015-08-06 ENCOUNTER — Ambulatory Visit: Payer: PPO

## 2015-08-06 DIAGNOSIS — M545 Low back pain, unspecified: Secondary | ICD-10-CM

## 2015-08-06 DIAGNOSIS — R2681 Unsteadiness on feet: Secondary | ICD-10-CM

## 2015-08-06 DIAGNOSIS — M5386 Other specified dorsopathies, lumbar region: Secondary | ICD-10-CM

## 2015-08-06 DIAGNOSIS — R198 Other specified symptoms and signs involving the digestive system and abdomen: Secondary | ICD-10-CM

## 2015-08-06 DIAGNOSIS — R29898 Other symptoms and signs involving the musculoskeletal system: Secondary | ICD-10-CM

## 2015-08-06 NOTE — Therapy (Signed)
[facility], [place], [address] Phone: [phone]   Fax:  [phone]  Physical Therapy Treatment  Patient Details  Name: Jeff Lopez MRN: [id number] Date of Birth: [birth year] Referring Provider: Basil Dess Md  Encounter Date: 08/06/2015      PT End of Session - 08/06/15 0953    Visit Number 4   Number of Visits 16   Date for PT Re-Evaluation 09/21/15   PT Start Time 0930   PT Stop Time 1030   PT Time Calculation (min) 60 min   Activity Tolerance Patient tolerated treatment well   Behavior During Therapy [facility] for tasks assessed/performed      Past Medical History  Diagnosis Date  . HYPERLIPIDEMIA 04/09/2007  . HYPERTENSION, BENIGN 04/19/2010  . GERD 04/09/2007  . RENAL CYST 05/27/2010  . OSTEOARTHRITIS, LUMBAR SPINE 04/09/2007  . LUMBAR DISC DISORDER 05/27/2010  . Headache(784.0)   . CAD, NATIVE VESSEL 04/19/2010    nonobstructive by cath 2007:  oLAD 20-30%, mLAD 50%, pCFX 20-30%, oAVCFX 20-30%, L renal art 50%;  normal LVF  . Nerve damage     Left leg  . Spinal headache     After having back surgery in 2014  . Hypothyroidism   . Hypothyroidism     previous hyperthyroidism, s/p I-131    Past Surgical History  Procedure Laterality Date  . Spine surgery  2006    C-spine surgery x 2  . Neck surgery      X 2  . Lumbar disc surgery  08/06/2012    L3 & L4  . Foot fracture surgery    . Lumbar laminectomy/decompression microdiscectomy Left 08/10/2012    Procedure: Dura Repair Left Side L2-L3;  Surgeon: [name], MD;  Location: [place];  Service: Orthopedics;  Laterality: Left;  Wilson Frame, Sliding table, dura repair kit, microscope  . Ercp N/A 07/14/2013    Procedure: ENDOSCOPIC RETROGRADE CHOLANGIOPANCREATOGRAPHY (ERCP);  Surgeon: [name], MD;  Location: [facility];  Service: Endoscopy;  Laterality: N/A;  . Cardiac catheterization  2007    Dr Loanne Drilling  . Fracture surgery     . Back surgery  2012,2014  . Lumbar disc surgery  11/11/2015    L1 L2    DR NITKA  . Lumbar laminectomy N/A 11/10/2013    Procedure: Left L1-2 far lateral approach to excise herniated nucleus pulposus;  Surgeon: [name], MD;  Location: [place];  Service: Orthopedics;  Laterality: N/A;  . Colonoscopy w/ polypectomy    . Cholecystectomy    . Cholecystectomy N/A 07/13/2013    Procedure: LAPAROSCOPIC CHOLECYSTECTOMY WITH INTRAOPERATIVE CHOLANGIOGRAM;  Surgeon: Shann Medal, MD;  Location: [facility];  Service: General;  Laterality: N/A;  . Eye surgery Bilateral     Cataract removal  . Lumbar fusion N/A 04/20/2015    Procedure: T12 to L1 fusion (Extension of Previous Fusion L2-S1 to T12-S1), Right Transforaminal lumbar interbody fusion, Posterior Fusion T12 to L1, with Pedicle screws, allograft, local bone graft, and  Vivigen;  Surgeon: [name], MD;  Location: [place];  Service: Orthopedics;  Laterality: N/A;    There were no vitals filed for this visit.  Visit Diagnosis:  Midline low back pain without sciatica  Weakness of both hips  Unsteadiness  Decreased ROM of lumbar spine  Abdominal weakness      Subjective Assessment - 08/06/15 0942    Subjective Doing ok today.    Currently in Pain? Yes  Pain Location Back   Pain Orientation Left;Mid;Lower                         [facility] Adult PT Treatment/Exercise - 08/06/15 0942    Lumbar Exercises: Aerobic   Stationary Bike Nu-Step L5 x 10 min UE and LE   Lumbar Exercises: Supine   Ab Set 10 reps   AB Set Limitations 10 sec hold VErbal and tactile cues for abominal control correct technique to limit rib raising   Glut Set 10 reps;5 seconds   Clam 10 reps  RT and LT blue band with cued for control with ab set   Heel Slides 10 reps   Bent Knee Raise 10 reps;2 seconds   Bent Knee Raise Limitations RT and LT   Bridge 10 reps;2 seconds   Bridge Limitations raising up only 2-3 inches to gently work on extension    Lumbar Exercises: Prone   Straight Leg Raise 10 reps  RT and LT   Lumbar Exercises: Quadruped   Other Quadruped Lumbar Exercises prone multifidus x 15 unilateral pelvic lift 1-2 inches  RT and LT   Moist Heat Therapy   Number Minutes Moist Heat 12 Minutes   Moist Heat Location Lumbar Spine                  PT Short Term Goals - 08/04/15 1039    PT SHORT TERM GOAL #1   Title pt will be I with inital HEP (08/24/2015)   Time 4   Period Weeks   Status On-going   PT SHORT TERM GOAL #2   Title pt will be able to verbalize and demonstrate techniques to reduce low back pain via postural awarness, proper lifting and carrying mechanics, and HEP (08/24/2015)   Time 4   Period Weeks   Status On-going           PT Long Term Goals - 08/04/15 1039    PT LONG TERM GOAL #1   Title pt will be I with all HEP as of last visit (09/21/2015)   Time 8   Period Weeks   Status On-going   PT LONG TERM GOAL #2   Title pt will improve bil hip strength to >/= 4/5 strength with </=2/10 pain to assist with safety with prolonged standing and walking activities (09/21/2015)   Time 8   Period Weeks   Status On-going   PT LONG TERM GOAL #3   Title pt improve trunk flexion by >/= 10 degrees, and trunk extension by >/=5 degrees with </= 2/10 pain to assist with ADLS (11/03/2583)   Time 8   Period Weeks   Status On-going   PT LONG TERM GOAL #4   Title pt will be able to go >/= 6 hours without his brace with no report of feeling weak to promote core stability and functional endurance during ADLS (2/77/8242)   Time 8   Period Weeks   Status On-going   PT LONG TERM GOAL #5   Title pt will improve his FOTO score to >/= 59 points to demonstrate improvement in function (09/21/2015)   Time 8   Period Weeks   Status On-going               Plan - 08/06/15 1037    Clinical Impression Statement He is doing well with exercises and without significant inc pain. His mobility is good. Continue to push  core strength/stability   PT Next  Visit Plan  core strengthening, light extenison exercise per script, hip strengthening, modalities PRN, give pirifromis and hamstring stretch as HEP next visit   Consulted and Agree with Plan of Care Patient        Problem List Patient Active Problem List   Diagnosis Date Noted  . Spinal stenosis, lumbar region, with neurogenic claudication 04/21/2015    Class: Chronic  . Spondylolisthesis of lumbar region 04/21/2015    Class: Chronic  . Hypothyroidism following radioiodine therapy 03/10/2015  . Screening for prostate cancer 03/10/2015  . Hemochromatosis 03/27/2014  . Other abnormal blood chemistry 02/12/2014  . Choledocholithiasis 07/14/2013  . Nonspecific (abnormal) findings on radiological and other examination of biliary tract 07/13/2013  . Calculus of gallbladder with acute cholecystitis, without mention of obstruction 07/13/2013  . Calculus of bile duct without mention of cholecystitis or obstruction 07/13/2013  . Abdominal pain 07/12/2013  . Neck pain on left side 05/30/2013  . Quit smoking 11/28/2012  . Postoperative CSF leak 08/10/2012    Class: Acute  . Degenerative disc disease, lumbar 08/06/2012    Class: Chronic  . HNP (herniated nucleus pulposus), lumbar 08/06/2012    Class: Acute  . Diabetes ([place]) 06/10/2012  . COPD GOLD 0 02/09/2012  . Chest pain at rest 02/06/2012  . Allergic rhinitis, cause unspecified 02/06/2012  . Radiculopathy of leg 11/05/2010  . Cough 08/23/2010  . RENAL CYST 05/27/2010  . LUMBAR DISC DISORDER 05/27/2010  . HYPERTENSION, BENIGN 04/19/2010  . CAD, NATIVE VESSEL 04/19/2010  . HYPERLIPIDEMIA 04/09/2007  . GERD 04/09/2007  . OSTEOARTHRITIS, LUMBAR SPINE 04/09/2007    Darrel Hoover PT 08/06/2015, 10:39 AM  [facility] [address] [place], [place], [address] Phone: [phone]   Fax:  [phone]  Name: JAVON SNEE MRN:  [id number] Date of Birth: 1950/12/04

## 2015-08-09 ENCOUNTER — Ambulatory Visit: Payer: PPO

## 2015-08-09 ENCOUNTER — Other Ambulatory Visit: Payer: Self-pay

## 2015-08-09 MED ORDER — PANTOPRAZOLE SODIUM 40 MG PO TBEC: 40.0000 mg | DELAYED_RELEASE_TABLET | Freq: Two times a day (BID) | ORAL | Status: DC

## 2015-08-09 NOTE — Telephone Encounter (Signed)
No further refills until appt is kept.  10/11/15 845 am

## 2015-08-11 ENCOUNTER — Ambulatory Visit: Payer: PPO | Admitting: Physical Therapy

## 2015-08-12 DIAGNOSIS — M4310 Spondylolisthesis, site unspecified: Secondary | ICD-10-CM | POA: Diagnosis not present

## 2015-08-12 DIAGNOSIS — M961 Postlaminectomy syndrome, not elsewhere classified: Secondary | ICD-10-CM | POA: Diagnosis not present

## 2015-08-13 ENCOUNTER — Ambulatory Visit: Payer: PPO

## 2015-08-16 ENCOUNTER — Ambulatory Visit: Payer: PPO

## 2015-08-16 DIAGNOSIS — M545 Low back pain, unspecified: Secondary | ICD-10-CM

## 2015-08-16 DIAGNOSIS — R198 Other specified symptoms and signs involving the digestive system and abdomen: Secondary | ICD-10-CM

## 2015-08-16 DIAGNOSIS — R29898 Other symptoms and signs involving the musculoskeletal system: Secondary | ICD-10-CM

## 2015-08-16 DIAGNOSIS — M5386 Other specified dorsopathies, lumbar region: Secondary | ICD-10-CM

## 2015-08-16 DIAGNOSIS — R2681 Unsteadiness on feet: Secondary | ICD-10-CM

## 2015-08-16 NOTE — Therapy (Signed)
[facility], [place], [address] Phone: [phone]   Fax:  [phone]  Physical Therapy Treatment  Patient Details  Name: Jeff Lopez MRN: [id number] Date of Birth: June 03, 1951 Referring Provider: Basil Dess Md  Encounter Date: 08/16/2015      PT End of Session - 08/16/15 1055    Visit Number 5   Number of Visits 16   Date for PT Re-Evaluation 09/21/15   PT Start Time 1010   PT Stop Time 1100   PT Time Calculation (min) 50 min   Activity Tolerance Patient tolerated treatment well   Behavior During Therapy [facility] for tasks assessed/performed      Past Medical History  Diagnosis Date  . HYPERLIPIDEMIA 04/09/2007  . HYPERTENSION, BENIGN 04/19/2010  . GERD 04/09/2007  . RENAL CYST 05/27/2010  . OSTEOARTHRITIS, LUMBAR SPINE 04/09/2007  . LUMBAR DISC DISORDER 05/27/2010  . Headache(784.0)   . CAD, NATIVE VESSEL 04/19/2010    nonobstructive by cath 2007:  oLAD 20-30%, mLAD 50%, pCFX 20-30%, oAVCFX 20-30%, L renal art 50%;  normal LVF  . Nerve damage     Left leg  . Spinal headache     After having back surgery in 2014  . Hypothyroidism   . Hypothyroidism     previous hyperthyroidism, s/p I-131    Past Surgical History  Procedure Laterality Date  . Spine surgery  2006    C-spine surgery x 2  . Neck surgery      X 2  . Lumbar disc surgery  08/06/2012    L3 & L4  . Foot fracture surgery    . Lumbar laminectomy/decompression microdiscectomy Left 08/10/2012    Procedure: Dura Repair Left Side L2-L3;  Surgeon: [name], MD;  Location: [place];  Service: Orthopedics;  Laterality: Left;  Wilson Frame, Sliding table, dura repair kit, microscope  . Ercp N/A 07/14/2013    Procedure: ENDOSCOPIC RETROGRADE CHOLANGIOPANCREATOGRAPHY (ERCP);  Surgeon: [name], MD;  Location: [facility];  Service: Endoscopy;  Laterality: N/A;  . Cardiac catheterization  2007    Dr Loanne Drilling  . Fracture surgery     . Back surgery  2012,2014  . Lumbar disc surgery  11/11/2015    L1 L2    DR NITKA  . Lumbar laminectomy N/A 11/10/2013    Procedure: Left L1-2 far lateral approach to excise herniated nucleus pulposus;  Surgeon: [name], MD;  Location: [place];  Service: Orthopedics;  Laterality: N/A;  . Colonoscopy w/ polypectomy    . Cholecystectomy    . Cholecystectomy N/A 07/13/2013    Procedure: LAPAROSCOPIC CHOLECYSTECTOMY WITH INTRAOPERATIVE CHOLANGIOGRAM;  Surgeon: Shann Medal, MD;  [facility]: [facility];  Service: General;  Laterality: N/A;  . Eye surgery Bilateral     Cataract removal  . Lumbar fusion N/A 04/20/2015    Procedure: T12 to L1 fusion (Extension of Previous Fusion L2-S1 to T12-S1), Right Transforaminal lumbar interbody fusion, Posterior Fusion T12 to L1, with Pedicle screws, allograft, local bone graft, and  Vivigen;  Surgeon: [name], MD;  Location: [place];  Service: Orthopedics;  Laterality: N/A;    There were no vitals filed for this visit.  Visit Diagnosis:  Midline low back pain without sciatica  Weakness of both hips  Unsteadiness  Decreased ROM of lumbar spine  Abdominal weakness      Subjective Assessment - 08/16/15 1014    Subjective Couldn't make last 3 visits cause needed to have car  fixed.    Currently in Pain? Yes   Pain Score 4    Pain Location Back   Pain Orientation Left;Lower;Mid   Pain Descriptors / Indicators Aching;Sore   Pain Type Surgical pain  on top of chronic   Pain Onset More than a month ago   Pain Frequency Intermittent   Aggravating Factors  twisting bending , activity   Pain Relieving Factors brace, rest lying, heat, meds   Multiple Pain Sites No                         [facility] Adult PT Treatment/Exercise - 08/16/15 1018    Lumbar Exercises: Stretches   Passive Hamstring Stretch 1 rep;60 seconds  RT and LT, stopped LT early due to cramp ant/lat hip   Single Knee to Chest Stretch 1 rep;60 seconds   Pelvic Tilt 10  seconds  10 reps   Piriformis Stretch 1 rep;60 seconds   Lumbar Exercises: Aerobic   Stationary Bike Nu-Step L5 x 9 min UE and LE   Lumbar Exercises: Supine   Clam 15 reps  with abdom set.   Heel Slides 10 reps  cued for abdomenal set RT and LT   Bent Knee Raise 10 reps;2 seconds   Bent Knee Raise Limitations RT and LT   Lumbar Exercises: Sidelying   Clam 15 reps  RT and LT   Hip Abduction 10 reps  RT and LT                  PT Short Term Goals - 08/16/15 1057    PT SHORT TERM GOAL #1   Title pt will be I with inital HEP (08/24/2015)   Status On-going   PT SHORT TERM GOAL #2   Title pt will be able to verbalize and demonstrate techniques to reduce low back pain via postural awarness, proper lifting and carrying mechanics, and HEP (08/24/2015)   Status On-going           PT Long Term Goals - 08/04/15 1039    PT LONG TERM GOAL #1   Title pt will be I with all HEP as of last visit (09/21/2015)   Time 8   Period Weeks   Status On-going   PT LONG TERM GOAL #2   Title pt will improve bil hip strength to >/= 4/5 strength with </=2/10 pain to assist with safety with prolonged standing and walking activities (09/21/2015)   Time 8   Period Weeks   Status On-going   PT LONG TERM GOAL #3   Title pt improve trunk flexion by >/= 10 degrees, and trunk extension by >/=5 degrees with </= 2/10 pain to assist with ADLS (7/94/8016)   Time 8   Period Weeks   Status On-going   PT LONG TERM GOAL #4   Title pt will be able to go >/= 6 hours without his brace with no report of feeling weak to promote core stability and functional endurance during ADLS (5/53/7482)   Time 8   Period Weeks   Status On-going   PT LONG TERM GOAL #5   Title pt will improve his FOTO score to >/= 59 points to demonstrate improvement in function (09/21/2015)   Time 8   Period Weeks   Status On-going               Plan - 08/16/15 1055    Clinical Impression Statement He missed a week due to  car  problems but is doing well with all exercises.  He did not have increased pain.    PT Next Visit Plan  core strengthening, light extenison exercise per script, hip strengthening, modalities PRN, give pirifromis and hamstring stretch as HEP next visit, review HEP previously issued.    Consulted and Agree with Plan of Care Patient        Problem List Patient Active Problem List   Diagnosis Date Noted  . Spinal stenosis, lumbar region, with neurogenic claudication 04/21/2015    Class: Chronic  . Spondylolisthesis of lumbar region 04/21/2015    Class: Chronic  . Hypothyroidism following radioiodine therapy 03/10/2015  . Screening for prostate cancer 03/10/2015  . Hemochromatosis 03/27/2014  . Other abnormal blood chemistry 02/12/2014  . Choledocholithiasis 07/14/2013  . Nonspecific (abnormal) findings on radiological and other examination of biliary tract 07/13/2013  . Calculus of gallbladder with acute cholecystitis, without mention of obstruction 07/13/2013  . Calculus of bile duct without mention of cholecystitis or obstruction 07/13/2013  . Abdominal pain 07/12/2013  . Neck pain on left side 05/30/2013  . Quit smoking 11/28/2012  . Postoperative CSF leak 08/10/2012    Class: Acute  . Degenerative disc disease, lumbar 08/06/2012    Class: Chronic  . HNP (herniated nucleus pulposus), lumbar 08/06/2012    Class: Acute  . Diabetes ([place]) 06/10/2012  . COPD GOLD 0 02/09/2012  . Chest pain at rest 02/06/2012  . Allergic rhinitis, cause unspecified 02/06/2012  . Radiculopathy of leg 11/05/2010  . Cough 08/23/2010  . RENAL CYST 05/27/2010  . LUMBAR DISC DISORDER 05/27/2010  . HYPERTENSION, BENIGN 04/19/2010  . CAD, NATIVE VESSEL 04/19/2010  . HYPERLIPIDEMIA 04/09/2007  . GERD 04/09/2007  . OSTEOARTHRITIS, LUMBAR SPINE 04/09/2007    Darrel Hoover PT 08/16/2015, 11:02 AM  [facility] [address] [place], [place],  [address] Phone: [phone]   Fax:  [phone]  Name: OLE LAFON MRN: [id number] Date of Birth: 12/10/1950

## 2015-08-18 ENCOUNTER — Ambulatory Visit: Payer: PPO

## 2015-08-18 DIAGNOSIS — R29898 Other symptoms and signs involving the musculoskeletal system: Secondary | ICD-10-CM

## 2015-08-18 DIAGNOSIS — R198 Other specified symptoms and signs involving the digestive system and abdomen: Secondary | ICD-10-CM

## 2015-08-18 DIAGNOSIS — M5386 Other specified dorsopathies, lumbar region: Secondary | ICD-10-CM

## 2015-08-18 DIAGNOSIS — M545 Low back pain, unspecified: Secondary | ICD-10-CM

## 2015-08-18 DIAGNOSIS — R2681 Unsteadiness on feet: Secondary | ICD-10-CM

## 2015-08-18 NOTE — Patient Instructions (Signed)
From cabinet issued sitting unattached scapula band exercises with cues for erect posture. He has green band at home. Daily 10-20 reps

## 2015-08-18 NOTE — Therapy (Signed)
[place], [place], [address] Phone: [phone]   Fax:  [phone]  Physical Therapy Treatment  Patient Details  Name: Jeff Lopez MRN: [id number] Date of Birth: 04-Jun-1951 Referring Provider: Basil Dess Md  Encounter Date: 08/18/2015      PT End of Session - 08/18/15 1006    Visit Number 6   Number of Visits 16   Date for PT Re-Evaluation 09/21/15   PT Start Time 0930   PT Stop Time 1030   PT Time Calculation (min) 60 min   Activity Tolerance Patient tolerated treatment well   Behavior During Therapy [facility] for tasks assessed/performed      Past Medical History  Diagnosis Date  . HYPERLIPIDEMIA 04/09/2007  . HYPERTENSION, BENIGN 04/19/2010  . GERD 04/09/2007  . RENAL CYST 05/27/2010  . OSTEOARTHRITIS, LUMBAR SPINE 04/09/2007  . LUMBAR DISC DISORDER 05/27/2010  . Headache(784.0)   . CAD, NATIVE VESSEL 04/19/2010    nonobstructive by cath 2007:  oLAD 20-30%, mLAD 50%, pCFX 20-30%, oAVCFX 20-30%, L renal art 50%;  normal LVF  . Nerve damage     Left leg  . Spinal headache     After having back surgery in 2014  . Hypothyroidism   . Hypothyroidism     previous hyperthyroidism, s/p I-131    Past Surgical History  Procedure Laterality Date  . Spine surgery  2006    C-spine surgery x 2  . Neck surgery      X 2  . Lumbar disc surgery  08/06/2012    L3 & L4  . Foot fracture surgery    . Lumbar laminectomy/decompression microdiscectomy Left 08/10/2012    Procedure: Dura Repair Left Side L2-L3;  Surgeon: [name], MD;  Location: [place];  Service: Orthopedics;  Laterality: Left;  Wilson Frame, Sliding table, dura repair kit, microscope  . Ercp N/A 07/14/2013    Procedure: ENDOSCOPIC RETROGRADE CHOLANGIOPANCREATOGRAPHY (ERCP);  Surgeon: [name], MD;  Location: [facility];  Service: Endoscopy;  Laterality: N/A;  . Cardiac catheterization  2007    Dr Loanne Drilling  . Fracture surgery     . Back surgery  2012,2014  . Lumbar disc surgery  11/11/2015    L1 L2    DR NITKA  . Lumbar laminectomy N/A 11/10/2013    Procedure: Left L1-2 far lateral approach to excise herniated nucleus pulposus;  Surgeon: [name], MD;  Location: [place];  Service: Orthopedics;  Laterality: N/A;  . Colonoscopy w/ polypectomy    . Cholecystectomy    . Cholecystectomy N/A 07/13/2013    Procedure: LAPAROSCOPIC CHOLECYSTECTOMY WITH INTRAOPERATIVE CHOLANGIOGRAM;  Surgeon: Shann Medal, MD;  Location: [facility];  Service: General;  Laterality: N/A;  . Eye surgery Bilateral     Cataract removal  . Lumbar fusion N/A 04/20/2015    Procedure: T12 to L1 fusion (Extension of Previous Fusion L2-S1 to T12-S1), Right Transforaminal lumbar interbody fusion, Posterior Fusion T12 to L1, with Pedicle screws, allograft, local bone graft, and  Vivigen;  Surgeon: [name], MD;  Location: [place];  Service: Orthopedics;  Laterality: N/A;    There were no vitals filed for this visit.  Visit Diagnosis:  Midline low back pain without sciatica  Weakness of both hips  Unsteadiness  Decreased ROM of lumbar spine  Abdominal weakness      Subjective Assessment - 08/18/15 0935    Subjective i didn't sleep much last night. Hurting today.  Currently in Pain? Yes   Pain Score 4    Pain Location Back   Pain Orientation Left;Lower;Mid   Pain Descriptors / Indicators Aching;Sore   Pain Type Chronic pain;Surgical pain   Pain Onset More than a month ago   Multiple Pain Sites No                         [facility] Adult PT Treatment/Exercise - 08/18/15 0937    Lumbar Exercises: Aerobic   Stationary Bike Nu-Step L5 x 6 min UE and LE   Lumbar Exercises: Supine   Ab Set 10 reps   AB Set Limitations 10 sec hold VErbal and tactile cues for abominal control correct technique to limit rib raising   Clam 15 reps  5 sets of 3   Bent Knee Raise 5 reps   Bent Knee Raise Limitations RT and LT   Bridge 15 reps    Bridge Limitations raising up only 2-3 inches to gently work on extension   Other Supine Lumbar Exercises Started in sitting without brace and  red band UE exercise with hor abduction 12 reps, flexion 6 reps RT and LT, ER 12 reps   Lumbar Exercises: Prone   Other Prone Lumbar Exercises multifidus with anterior and post pelvic tilt x 15   Lumbar Exercises: Quadruped   Other Quadruped Lumbar Exercises prone (not quadraped)multifidus x 15 unilateral pelvic lift 1-2 inches  RT and LT   Moist Heat Therapy   Number Minutes Moist Heat 15 Minutes   Moist Heat Location Lumbar Spine                PT Education - 08/18/15 1005    Education provided Yes   Education Details Band  exercise UE sitting   Person(s) Educated Patient   Methods Explanation;Tactile cues;Verbal cues;Handout   Comprehension Returned demonstration;Verbalized understanding          PT Short Term Goals - 08/16/15 1057    PT SHORT TERM GOAL #1   Title pt will be I with inital HEP (08/24/2015)   Status On-going   PT SHORT TERM GOAL #2   Title pt will be able to verbalize and demonstrate techniques to reduce low back pain via postural awarness, proper lifting and carrying mechanics, and HEP (08/24/2015)   Status On-going           PT Long Term Goals - 08/04/15 1039    PT LONG TERM GOAL #1   Title pt will be I with all HEP as of last visit (09/21/2015)   Time 8   Period Weeks   Status On-going   PT LONG TERM GOAL #2   Title pt will improve bil hip strength to >/= 4/5 strength with </=2/10 pain to assist with safety with prolonged standing and walking activities (09/21/2015)   Time 8   Period Weeks   Status On-going   PT LONG TERM GOAL #3   Title pt improve trunk flexion by >/= 10 degrees, and trunk extension by >/=5 degrees with </= 2/10 pain to assist with ADLS (06/28/7251)   Time 8   Period Weeks   Status On-going   PT LONG TERM GOAL #4   Title pt will be able to go >/= 6 hours without his brace with no  report of feeling weak to promote core stability and functional endurance during ADLS (6/64/4034)   Time 8   Period Weeks   Status On-going   PT LONG  TERM GOAL #5   Title pt will improve his FOTO score to >/= 59 points to demonstrate improvement in function (09/21/2015)   Time 8   Period Weeks   Status On-going               Plan - 08/18/15 1007    Clinical Impression Statement He continues to have pain but exercise does not incr pain though he reports some soreness lately and relates this to  activating muscles. He is able to demo all HEP correctly   PT Next Visit Plan  core strengthening, light extenison exercise per script, hip strengthening, modalities PRN, , review HEP previously issued.    PT Home Exercise Plan UE band exercise with good posture   Consulted and Agree with Plan of Care Patient        Problem List Patient Active Problem List   Diagnosis Date Noted  . Spinal stenosis, lumbar region, with neurogenic claudication 04/21/2015    Class: Chronic  . Spondylolisthesis of lumbar region 04/21/2015    Class: Chronic  . Hypothyroidism following radioiodine therapy 03/10/2015  . Screening for prostate cancer 03/10/2015  . Hemochromatosis 03/27/2014  . Other abnormal blood chemistry 02/12/2014  . Choledocholithiasis 07/14/2013  . Nonspecific (abnormal) findings on radiological and other examination of biliary tract 07/13/2013  . Calculus of gallbladder with acute cholecystitis, without mention of obstruction 07/13/2013  . Calculus of bile duct without mention of cholecystitis or obstruction 07/13/2013  . Abdominal pain 07/12/2013  . Neck pain on left side 05/30/2013  . Quit smoking 11/28/2012  . Postoperative CSF leak 08/10/2012    Class: Acute  . Degenerative disc disease, lumbar 08/06/2012    Class: Chronic  . HNP (herniated nucleus pulposus), lumbar 08/06/2012    Class: Acute  . Diabetes ([place]) 06/10/2012  . COPD GOLD 0 02/09/2012  . Chest pain at rest  02/06/2012  . Allergic rhinitis, cause unspecified 02/06/2012  . Radiculopathy of leg 11/05/2010  . Cough 08/23/2010  . RENAL CYST 05/27/2010  . LUMBAR DISC DISORDER 05/27/2010  . HYPERTENSION, BENIGN 04/19/2010  . CAD, NATIVE VESSEL 04/19/2010  . HYPERLIPIDEMIA 04/09/2007  . GERD 04/09/2007  . OSTEOARTHRITIS, LUMBAR SPINE 04/09/2007    Darrel Hoover PT 08/18/2015, 10:13 AM  [facility] [address] [place], [place], [address] Phone: [phone]   Fax:  [phone]  Name: Jeff Lopez MRN: [id number] Date of Birth: Jul 13, 1950

## 2015-08-23 ENCOUNTER — Ambulatory Visit: Payer: PPO

## 2015-08-25 ENCOUNTER — Ambulatory Visit: Payer: PPO

## 2015-08-30 ENCOUNTER — Ambulatory Visit: Payer: PPO | Attending: Specialist

## 2015-08-30 DIAGNOSIS — R198 Other specified symptoms and signs involving the digestive system and abdomen: Secondary | ICD-10-CM | POA: Diagnosis not present

## 2015-08-30 DIAGNOSIS — M2560 Stiffness of unspecified joint, not elsewhere classified: Secondary | ICD-10-CM | POA: Diagnosis not present

## 2015-08-30 DIAGNOSIS — R2681 Unsteadiness on feet: Secondary | ICD-10-CM | POA: Insufficient documentation

## 2015-08-30 DIAGNOSIS — M545 Low back pain, unspecified: Secondary | ICD-10-CM

## 2015-08-30 DIAGNOSIS — M5386 Other specified dorsopathies, lumbar region: Secondary | ICD-10-CM

## 2015-08-30 NOTE — Therapy (Signed)
[place], [place], [address] Phone: [phone]   Fax:  [phone]  Physical Therapy Treatment  Patient Details  Name: Jeff Lopez MRN: [id number] Date of Birth: May 05, 1951 Referring Provider: Basil Dess Md  Encounter Date: 08/30/2015      PT End of Session - 08/30/15 1038    Visit Number 7   Number of Visits 16   Date for PT Re-Evaluation 09/21/15   PT Start Time 8453   PT Stop Time 1100   PT Time Calculation (min) 45 min   Activity Tolerance Patient tolerated treatment well   Behavior During Therapy [facility] for tasks assessed/performed      Past Medical History  Diagnosis Date  . HYPERLIPIDEMIA 04/09/2007  . HYPERTENSION, BENIGN 04/19/2010  . GERD 04/09/2007  . RENAL CYST 05/27/2010  . OSTEOARTHRITIS, LUMBAR SPINE 04/09/2007  . LUMBAR DISC DISORDER 05/27/2010  . Headache(784.0)   . CAD, NATIVE VESSEL 04/19/2010    nonobstructive by cath 2007:  oLAD 20-30%, mLAD 50%, pCFX 20-30%, oAVCFX 20-30%, L renal art 50%;  normal LVF  . Nerve damage     Left leg  . Spinal headache     After having back surgery in 2014  . Hypothyroidism   . Hypothyroidism     previous hyperthyroidism, s/p I-131    Past Surgical History  Procedure Laterality Date  . Spine surgery  2006    C-spine surgery x 2  . Neck surgery      X 2  . Lumbar disc surgery  08/06/2012    L3 & L4  . Foot fracture surgery    . Lumbar laminectomy/decompression microdiscectomy Left 08/10/2012    Procedure: Dura Repair Left Side L2-L3;  Surgeon: [name], MD;  Location: [place];  Service: Orthopedics;  Laterality: Left;  Wilson Frame, Sliding table, dura repair kit, microscope  . Ercp N/A 07/14/2013    Procedure: ENDOSCOPIC RETROGRADE CHOLANGIOPANCREATOGRAPHY (ERCP);  Surgeon: [name], MD;  Location[facility];  Service: Endoscopy;  Laterality: N/A;  . Cardiac catheterization  2007    Dr Loanne Drilling  . Fracture surgery     . Back surgery  2012,2014  . Lumbar disc surgery  11/11/2015    L1 L2    DR NITKA  . Lumbar laminectomy N/A 11/10/2013    Procedure: Left L1-2 far lateral approach to excise herniated nucleus pulposus;  Surgeon: [name], MD;  Location: [place];  Service: Orthopedics;  Laterality: N/A;  . Colonoscopy w/ polypectomy    . Cholecystectomy    . Cholecystectomy N/A 07/13/2013    Procedure: LAPAROSCOPIC CHOLECYSTECTOMY WITH INTRAOPERATIVE CHOLANGIOGRAM;  Surgeon: Shann Medal, MD;  Location: [facility];  Service: General;  Laterality: N/A;  . Eye surgery Bilateral     Cataract removal  . Lumbar fusion N/A 04/20/2015    Procedure: T12 to L1 fusion (Extension of Previous Fusion L2-S1 to T12-S1), Right Transforaminal lumbar interbody fusion, Posterior Fusion T12 to L1, with Pedicle screws, allograft, local bone graft, and  Vivigen;  Surgeon: [name], MD;  Location: [place];  Service: Orthopedics;  Laterality: N/A;    There were no vitals filed for this visit.  Visit Diagnosis:  Midline low back pain without sciatica - Plan: PT plan of care cert/re-cert  Decreased ROM of lumbar spine - Plan: PT plan of care cert/re-cert  Unsteadiness - Plan: PT plan of care cert/re-cert  Abdominal weakness - Plan: PT plan of care cert/re-cert  Subjective Assessment - 08/30/15 1013    Subjective MD wants to have brace of 2 hours per week. Have it off some times 3-4 hours per day.     Currently in Pain? Yes   Pain Score 4    Pain Location Back   Pain Orientation Left;Lower   Pain Descriptors / Indicators Aching;Sore   Pain Type Chronic pain;Surgical pain   Pain Onset More than a month ago   Pain Frequency Intermittent   Multiple Pain Sites No            [facility] PT Assessment - 08/30/15 0001    Strength   Right Hip Flexion 5/5   Right Hip Extension 4+/5   Right Hip External Rotation  5/5   Right Hip Internal Rotation 5/5   Right Hip ABduction 4+/5   Right Hip ADduction 5/5   Left Hip  Flexion 5/5   Left Hip Extension 4+/5   Left Hip External Rotation 5/5   Left Hip Internal Rotation 5/5   Left Hip ABduction 5/5   Left Hip ADduction 5/5   Right Knee Flexion 5/5   Right Knee Extension 5/5   Left Knee Flexion 5/5   Left Knee Extension 5/5                     [facility] Adult PT Treatment/Exercise - 08/30/15 1020    Lumbar Exercises: Aerobic   Stationary Bike Nu-Step L5 x 6 min UE and LE   Lumbar Exercises: Supine   Ab Set 10 reps   AB Set Limitations 10 sec hold VErbal and tactile cues for abominal control correct technique to limit rib raising   Clam 20 reps   Clam Limitations with ab set   Bent Knee Raise 10 reps   Bent Knee Raise Limitations RT and LT   Bridge 15 reps   Bridge Limitations raising up only 2-3 inches to gently work on extension   Other Supine Lumbar Exercises Started in sitting without brace and  green band UE exercise with hor abduction 12 reps, flexion 6 reps RT and LT, ER 12 reps   Lumbar Exercises: Sidelying   Clam 15 reps   Hip Abduction 15 reps  Rt and LT   Lumbar Exercises: Prone   Other Prone Lumbar Exercises multifidus with single RT /LT 1 inch lift and then anterior and post pelvic tilt x 15                  PT Short Term Goals - 08/30/15 1039    PT SHORT TERM GOAL #1   Title pt will be I with inital HEP (08/24/2015)   Status Achieved   PT SHORT TERM GOAL #2   Title pt will be able to verbalize and demonstrate techniques to reduce low back pain via postural awarness, proper lifting and carrying mechanics, and HEP (08/24/2015)   Status On-going           PT Long Term Goals - 08/30/15 1039    PT LONG TERM GOAL #1   Title pt will be I with all HEP as of last visit (09/21/2015)   Status On-going   PT LONG TERM GOAL #2   Title pt will improve bil hip strength to >/= 4/5 strength with </=2/10 pain to assist with safety with prolonged standing and walking activities (09/21/2015)   Baseline Hip and LE strength  normal. Pain levels still up to 4-5/10   Status Partially Met   PT LONG  TERM GOAL #3   Title pt improve trunk flexion by >/= 10 degrees, and trunk extension by >/=5 degrees with </= 2/10 pain to assist with ADLS (5/88/3254)   Status On-going   PT LONG TERM GOAL #4   Title pt will be able to go >/= 6 hours without his brace with no report of feeling weak to promote core stability and functional endurance during ADLS (9/82/6415)   Status Partially Met   PT LONG TERM GOAL #5   Title pt will improve his FOTO score to >/= 59 points to demonstrate improvement in function (09/21/2015)   Status Unable to assess               Plan - 08/30/15 1042    Clinical Impression Statement He continues to improve as his leg strength is back to normal. He is functioning close to normal   with brace.  He was having LT leg pain  with Nustep  and was advised to stop or adjust seat to stop pain in leg.    PT Frequency 2x / week   PT Duration 3 weeks   PT Treatment/Interventions ADLs/Self Care Home Management;Cryotherapy;Electrical Stimulation;Iontophoresis 8m/ml Dexamethasone;Moist Heat;Therapeutic exercise;Manual techniques;Therapeutic activities;Dry needling;Ultrasound;Passive range of motion;Patient/family education;Taping   PT Next Visit Plan  core strengthening, light extenison exercise per script, hip strengthening, modalities PRN, , review HEP previously issued.    Consulted and Agree with Plan of Care Patient        Problem List Patient Active Problem List   Diagnosis Date Noted  . Spinal stenosis, lumbar region, with neurogenic claudication 04/21/2015    Class: Chronic  . Spondylolisthesis of lumbar region 04/21/2015    Class: Chronic  . Hypothyroidism following radioiodine therapy 03/10/2015  . Screening for prostate cancer 03/10/2015  . Hemochromatosis 03/27/2014  . Other abnormal blood chemistry 02/12/2014  . Choledocholithiasis 07/14/2013  . Nonspecific (abnormal) findings on  radiological and other examination of biliary tract 07/13/2013  . Calculus of gallbladder with acute cholecystitis, without mention of obstruction 07/13/2013  . Calculus of bile duct without mention of cholecystitis or obstruction 07/13/2013  . Abdominal pain 07/12/2013  . Neck pain on left side 05/30/2013  . Quit smoking 11/28/2012  . Postoperative CSF leak 08/10/2012    Class: Acute  . Degenerative disc disease, lumbar 08/06/2012    Class: Chronic  . HNP (herniated nucleus pulposus), lumbar 08/06/2012    Class: Acute  . Diabetes ([place] 06/10/2012  . COPD GOLD 0 02/09/2012  . Chest pain at rest 02/06/2012  . Allergic rhinitis, cause unspecified 02/06/2012  . Radiculopathy of leg 11/05/2010  . Cough 08/23/2010  . RENAL CYST 05/27/2010  . LUMBAR DISC DISORDER 05/27/2010  . HYPERTENSION, BENIGN 04/19/2010  . CAD, NATIVE VESSEL 04/19/2010  . HYPERLIPIDEMIA 04/09/2007  . GERD 04/09/2007  . OSTEOARTHRITIS, LUMBAR SPINE 04/09/2007    CDarrel HooverPT 08/30/2015, 10:54 AM  [facility]18[address]GBerlin N[place] [address]Phone: [phone]  Fax:  [phone] Name: REUCLIDE GRANITOMRN: [id number] of Birth: [birth year]

## 2015-09-01 ENCOUNTER — Ambulatory Visit: Payer: PPO | Admitting: Physical Therapy

## 2015-09-01 DIAGNOSIS — R198 Other specified symptoms and signs involving the digestive system and abdomen: Secondary | ICD-10-CM

## 2015-09-01 DIAGNOSIS — R2681 Unsteadiness on feet: Secondary | ICD-10-CM

## 2015-09-01 DIAGNOSIS — M545 Low back pain, unspecified: Secondary | ICD-10-CM

## 2015-09-01 DIAGNOSIS — M5386 Other specified dorsopathies, lumbar region: Secondary | ICD-10-CM

## 2015-09-01 NOTE — Therapy (Addendum)
[place], [place], [address] Phone: [phone]   Fax:  [phone]  Physical Therapy Treatment / Discharge note  Patient Details  Name: Jeff Lopez MRN: [id number] Date of Birth: 1950/07/17 Referring Provider: Basil Dess Md  Encounter Date: 09/01/2015      PT End of Session - 09/01/15 1059    Visit Number 8   Number of Visits 16   Date for PT Re-Evaluation 09/21/15   PT Start Time 1016   PT Stop Time 1100   PT Time Calculation (min) 44 min   Activity Tolerance Patient tolerated treatment well   Behavior During Therapy [facility] for tasks assessed/performed      Past Medical History  Diagnosis Date  . HYPERLIPIDEMIA 04/09/2007  . HYPERTENSION, BENIGN 04/19/2010  . GERD 04/09/2007  . RENAL CYST 05/27/2010  . OSTEOARTHRITIS, LUMBAR SPINE 04/09/2007  . LUMBAR DISC DISORDER 05/27/2010  . Headache(784.0)   . CAD, NATIVE VESSEL 04/19/2010    nonobstructive by cath 2007:  oLAD 20-30%, mLAD 50%, pCFX 20-30%, oAVCFX 20-30%, L renal art 50%;  normal LVF  . Nerve damage     Left leg  . Spinal headache     After having back surgery in 2014  . Hypothyroidism   . Hypothyroidism     previous hyperthyroidism, s/p I-131    Past Surgical History  Procedure Laterality Date  . Spine surgery  2006    C-spine surgery x 2  . Neck surgery      X 2  . Lumbar disc surgery  08/06/2012    L3 & L4  . Foot fracture surgery    . Lumbar laminectomy/decompression microdiscectomy Left 08/10/2012    Procedure: Dura Repair Left Side L2-L3;  Surgeon: [name], MD;  Location: [place];  Service: Orthopedics;  Laterality: Left;  Wilson Frame, Sliding table, dura repair kit, microscope  . Ercp N/A 07/14/2013    Procedure: ENDOSCOPIC RETROGRADE CHOLANGIOPANCREATOGRAPHY (ERCP);  Surgeon: [name], MD;  Location: [facility];  Service: Endoscopy;  Laterality: N/A;  . Cardiac catheterization  2007    Dr Loanne Drilling  .  Fracture surgery    . Back surgery  2012,2014  . Lumbar disc surgery  11/11/2015    L1 L2    DR NITKA  . Lumbar laminectomy N/A 11/10/2013    Procedure: Left L1-2 far lateral approach to excise herniated nucleus pulposus;  Surgeon: [name], MD;  Location: [place];  Service: Orthopedics;  Laterality: N/A;  . Colonoscopy w/ polypectomy    . Cholecystectomy    . Cholecystectomy N/A 07/13/2013    Procedure: LAPAROSCOPIC CHOLECYSTECTOMY WITH INTRAOPERATIVE CHOLANGIOGRAM;  Surgeon: Shann Medal, MD;  [facility]: [facility];  Service: General;  Laterality: N/A;  . Eye surgery Bilateral     Cataract removal  . Lumbar fusion N/A 04/20/2015    Procedure: T12 to L1 fusion (Extension of Previous Fusion L2-S1 to T12-S1), Right Transforaminal lumbar interbody fusion, Posterior Fusion T12 to L1, with Pedicle screws, allograft, local bone graft, and  Vivigen;  Surgeon: [name], MD;  Location: [place];  Service: Orthopedics;  Laterality: N/A;    There were no vitals filed for this visit.  Visit Diagnosis:  Midline low back pain without sciatica  Decreased ROM of lumbar spine  Unsteadiness  Abdominal weakness      Subjective Assessment - 09/01/15 1016    Subjective "I can tell that I have difficulty with stamina" He reports he  has been trying to work on going longer without the brace.   Currently in Pain? Yes   Pain Score 4    Pain Location Back   Pain Descriptors / Indicators --  fatigue   Pain Type Chronic pain;Surgical pain   Pain Onset More than a month ago   Pain Frequency Intermittent   Aggravating Factors  prolonged walking,    Pain Relieving Factors brace, resting, laying down, heat/ meds                         [facility] Adult PT Treatment/Exercise - 09/01/15 1024    Self-Care   Self-Care Other Self-Care Comments   Other Self-Care Comments  burning of the muscles is normal due to muscles giving off specific chemical causing a burning that should disspiate after  exercise has ceased   Lumbar Exercises: Stretches   Passive Hamstring Stretch 2 reps;30 seconds   Piriformis Stretch 2 reps;30 seconds   Lumbar Exercises: Aerobic   Stationary Bike Nu-Step L5 x 7 min UE and LE   Lumbar Exercises: Supine   Bent Knee Raise 20 reps  both legs to increase endurance    Bridge 15 reps  going half way up with glute squeeze on 2nd set   Other Supine Lumbar Exercises marching on dynadisc 2 x 10   cues to keep core tight and avoid using hands   Lumbar Exercises: Sidelying   Clam 15 reps  2 sets, 1 x each side   Lumbar Exercises: Prone   Opposite Arm/Leg Raise Right arm/Left leg;Left arm/Right leg;10 reps  on stomach lifting 1 inch off the ground. x 2 sets   Opposite Arm/Leg Raise Limitations cues for opposite sides and keeping core tight                PT Education - 09/01/15 1059    Education provided Yes   Education Details updated HEP   Person(s) Educated Patient   Methods Explanation   Comprehension Verbalized understanding          PT Short Term Goals - 08/30/15 1039    PT SHORT TERM GOAL #1   Title pt will be I with inital HEP (08/24/2015)   Status Achieved   PT SHORT TERM GOAL #2   Title pt will be able to verbalize and demonstrate techniques to reduce low back pain via postural awarness, proper lifting and carrying mechanics, and HEP (08/24/2015)   Status On-going           PT Long Term Goals - 08/30/15 1039    PT LONG TERM GOAL #1   Title pt will be I with all HEP as of last visit (09/21/2015)   Status On-going   PT LONG TERM GOAL #2   Title pt will improve bil hip strength to >/= 4/5 strength with </=2/10 pain to assist with safety with prolonged standing and walking activities (09/21/2015)   Baseline Hip and LE strength normal. Pain levels still up to 4-5/10   Status Partially Met   PT LONG TERM GOAL #3   Title pt improve trunk flexion by >/= 10 degrees, and trunk extension by >/=5 degrees with </= 2/10 pain to assist with  ADLS (2/50/5397)   Status On-going   PT LONG TERM GOAL #4   Title pt will be able to go >/= 6 hours without his brace with no report of feeling weak to promote core stability and functional endurance during ADLS (6/73/4193)  Status Partially Met   PT LONG TERM GOAL #5   Title pt will improve his FOTO score to >/= 59 points to demonstrate improvement in function (09/21/2015)   Status Unable to assess               Plan - 09/01/15 1106    Clinical Impression Statement Jeff Lopez states he feels like he is making progress with strength just reports having issues with stamina and endurance. Educated how muscles will causing burning symptoms with exercise and should be fleeting after exercises which pt reported understanding. Focused on core strengthening and progress trunk mobility. Discussed walking endurance and for pt to slowly progress in order to avoid doing too much.    PT Next Visit Plan  core strengthening, light extenison exercise per script, hip strengthening, modalities PRN   PT Home Exercise Plan piriformis stretching   Consulted and Agree with Plan of Care Patient        Problem List Patient Active Problem List   Diagnosis Date Noted  . Spinal stenosis, lumbar region, with neurogenic claudication 04/21/2015    Class: Chronic  . Spondylolisthesis of lumbar region 04/21/2015    Class: Chronic  . Hypothyroidism following radioiodine therapy 03/10/2015  . Screening for prostate cancer 03/10/2015  . Hemochromatosis 03/27/2014  . Other abnormal blood chemistry 02/12/2014  . Choledocholithiasis 07/14/2013  . Nonspecific (abnormal) findings on radiological and other examination of biliary tract 07/13/2013  . Calculus of gallbladder with acute cholecystitis, without mention of obstruction 07/13/2013  . Calculus of bile duct without mention of cholecystitis or obstruction 07/13/2013  . Abdominal pain 07/12/2013  . Neck pain on left side 05/30/2013  . Quit smoking 11/28/2012   . Postoperative CSF leak 08/10/2012    Class: Acute  . Degenerative disc disease, lumbar 08/06/2012    Class: Chronic  . HNP (herniated nucleus pulposus), lumbar 08/06/2012    Class: Acute  . Diabetes ([place]) 06/10/2012  . COPD GOLD 0 02/09/2012  . Chest pain at rest 02/06/2012  . Allergic rhinitis, cause unspecified 02/06/2012  . Radiculopathy of leg 11/05/2010  . Cough 08/23/2010  . RENAL CYST 05/27/2010  . LUMBAR DISC DISORDER 05/27/2010  . HYPERTENSION, BENIGN 04/19/2010  . CAD, NATIVE VESSEL 04/19/2010  . HYPERLIPIDEMIA 04/09/2007  . GERD 04/09/2007  . OSTEOARTHRITIS, LUMBAR SPINE 04/09/2007   Starr Lake PT, DPT, LAT, ATC  09/01/2015  11:11 AM     [facility] [address] [place], [place], [address] Phone: [phone]   Fax:  [phone]  Name: Jeff Lopez MRN: [id number] Date of Birth: 1951-06-11   PHYSICAL THERAPY DISCHARGE SUMMARY  Visits from Start of Care: 8  Current functional level related to goals / functional outcomes: See goals   Remaining deficits: Unknown due to pt not returning.    Education / Equipment: HEP, posture education.   Plan:                                                    Patient goals were partially met. Patient is being discharged due to not returning since the last visit.  ?????         Asra Gambrel PT, DPT, LAT, ATC  12/09/2015  2:54 PM

## 2015-09-03 ENCOUNTER — Ambulatory Visit: Payer: PPO | Admitting: Physical Therapy

## 2015-09-07 ENCOUNTER — Ambulatory Visit: Payer: PPO

## 2015-09-09 ENCOUNTER — Ambulatory Visit: Payer: PPO

## 2015-09-10 ENCOUNTER — Encounter: Payer: Self-pay | Admitting: Cardiovascular Disease

## 2015-09-10 ENCOUNTER — Ambulatory Visit (INDEPENDENT_AMBULATORY_CARE_PROVIDER_SITE_OTHER): Payer: PPO | Admitting: Cardiovascular Disease

## 2015-09-10 VITALS — BP 120/70 | HR 57 | Ht 71.0 in | Wt 189.0 lb

## 2015-09-10 DIAGNOSIS — R0602 Shortness of breath: Secondary | ICD-10-CM | POA: Diagnosis not present

## 2015-09-10 DIAGNOSIS — I2510 Atherosclerotic heart disease of native coronary artery without angina pectoris: Secondary | ICD-10-CM

## 2015-09-10 LAB — BASIC METABOLIC PANEL
BUN: 23 mg/dL (ref 7–25)
CALCIUM: 8.9 mg/dL (ref 8.6–10.3)
CO2: 24 mmol/L (ref 20–31)
CREATININE: 1.18 mg/dL (ref 0.70–1.25)
Chloride: 100 mmol/L (ref 98–110)
Glucose, Bld: 133 mg/dL — ABNORMAL HIGH (ref 65–99)
Potassium: 3.9 mmol/L (ref 3.5–5.3)
SODIUM: 138 mmol/L (ref 135–146)

## 2015-09-10 NOTE — Progress Notes (Signed)
Cardiology Office Note Date:  09/11/2015   ID:  Jeff Lopez, DOB 29-Jan-1951, MRN [id number]  PCP:  Renato Shin, MD  Cardiologist:  Sherren Mocha, MD    Chief Complaint  Patient presents with  . Shortness of Breath   History of Present Illness: Jeff Lopez is a 65 y.o. male who presents for follow-up of nonobstructive CAD.  The patient had cardiac catheterization in 2007 with 50% stenosis of the LAD noted. He also is followed for hypertension. His last nuclear stress test in 2016 showed no ischemia. Gated LVEF was 47%.   His biggest problems have been back-related. He had his 5th back surgery in October last year. He has DOE but hasn't been active and attributes this to weight gain. Denies chest pain, edema, or palpitations.   He's had hypokalemia with K as low as 2.4. He stopped taking potassium due to cost.    Past Medical History  Diagnosis Date  . HYPERLIPIDEMIA 04/09/2007  . HYPERTENSION, BENIGN 04/19/2010  . GERD 04/09/2007  . RENAL CYST 05/27/2010  . OSTEOARTHRITIS, LUMBAR SPINE 04/09/2007  . LUMBAR DISC DISORDER 05/27/2010  . Headache(784.0)   . CAD, NATIVE VESSEL 04/19/2010    nonobstructive by cath 2007:  oLAD 20-30%, mLAD 50%, pCFX 20-30%, oAVCFX 20-30%, L renal art 50%;  normal LVF  . Nerve damage     Left leg  . Spinal headache     After having back surgery in 2014  . Hypothyroidism   . Hypothyroidism     previous hyperthyroidism, s/p I-131    Past Surgical History  Procedure Laterality Date  . Spine surgery  2006    C-spine surgery x 2  . Neck surgery      X 2  . Lumbar disc surgery  08/06/2012    L3 & L4  . Foot fracture surgery    . Lumbar laminectomy/decompression microdiscectomy Left 08/10/2012    Procedure: Dura Repair Left Side L2-L3;  Surgeon: [name], MD;  Location: [place];  Service: Orthopedics;  Laterality: Left;  Wilson Frame, Sliding table, dura repair kit, microscope  . Ercp N/A 07/14/2013    Procedure: ENDOSCOPIC  RETROGRADE CHOLANGIOPANCREATOGRAPHY (ERCP);  Surgeon: [name], MD;  Location: [facility];  Service: Endoscopy;  Laterality: N/A;  . Cardiac catheterization  2007    Dr Loanne Drilling  . Fracture surgery    . Back surgery  2012,2014  . Lumbar disc surgery  11/11/2015    L1 L2    DR NITKA  . Lumbar laminectomy N/A 11/10/2013    Procedure: Left L1-2 far lateral approach to excise herniated nucleus pulposus;  Surgeon: [name], MD;  Location: [place];  Service: Orthopedics;  Laterality: N/A;  . Colonoscopy w/ polypectomy    . Cholecystectomy    . Cholecystectomy N/A 07/13/2013    Procedure: LAPAROSCOPIC CHOLECYSTECTOMY WITH INTRAOPERATIVE CHOLANGIOGRAM;  Surgeon: Shann Medal, MD;  Location: [facility];  Service: General;  Laterality: N/A;  . Eye surgery Bilateral     Cataract removal  . Lumbar fusion N/A 04/20/2015    Procedure: T12 to L1 fusion (Extension of Previous Fusion L2-S1 to T12-S1), Right Transforaminal lumbar interbody fusion, Posterior Fusion T12 to L1, with Pedicle screws, allograft, local bone graft, and  Vivigen;  Surgeon: [name], MD;  Location: [place];  Service: Orthopedics;  Laterality: N/A;    Current Outpatient Prescriptions  Medication Sig Dispense Refill  . levothyroxine (SYNTHROID, LEVOTHROID) 100 MCG tablet Take 1 tablet (100  mcg total) by mouth daily. 90 tablet 3  . losartan-hydrochlorothiazide (HYZAAR) 100-12.5 MG tablet take 1 tablet by mouth once daily 30 tablet 3  . metoprolol succinate (TOPROL-XL) 25 MG 24 hr tablet take 1 tablet by mouth once daily 30 tablet 3  . pantoprazole (PROTONIX) 40 MG tablet Take 1 tablet (40 mg total) by mouth 2 (two) times daily. Before meals 30 tablet 11  . Oxycodone HCl 20 MG TABS Reported on 09/10/2015  0   No current facility-administered medications for this visit.    Allergies:   Ceftin and Gabapentin   Social History:  The patient  reports that he quit smoking about 3 years ago. His smoking use included Cigarettes. He  has a 22 pack-year smoking history. He has never used smokeless tobacco. He reports that he does not drink alcohol or use illicit drugs.   Family History:  [name]  family history includes Breast cancer in his mother; Cancer in his mother; Heart attack in his mother; Heart disease in his mother; Hypertension in his brother; Uterine cancer in his mother. There is no history of Thyroid disease.    ROS:  Please see the history of present illness.  Otherwise, review of systems is positive for DOE, blood in stool, back pain, muscle pain, rash, easy bruising, headaches.  All other systems are reviewed and negative.    PHYSICAL EXAM: VS:  BP 120/70 mmHg  Pulse 57  Ht '5\' 11"'  (1.803 m)  Wt 189 lb (85.73 kg)  BMI 26.37 kg/m2 , BMI Body mass index is 26.37 kg/(m^2). GEN: Well nourished, well developed, in no acute distress HEENT: normal Neck: no JVD, no masses. No carotid bruits Cardiac: RRR without murmur or gallop                Respiratory:  clear to auscultation bilaterally, normal work of breathing GI: soft, nontender, nondistended, + BS MS: no deformity or atrophy Ext: no pretibial edema, pedal pulses 2+= bilaterally Skin: warm and dry, no rash Neuro:  Strength and sensation are intact Psych: euthymic mood, full affect  EKG:  EKG is ordered today. The ekg ordered today shows sinus bradycardia 57 bpm, T wave abnormality consider inferior ischemia  Recent Labs: 03/10/2015: TSH 1.72 04/14/2015: ALT 20 04/23/2015: Hemoglobin 13.2; Platelets 164 09/10/2015: BUN 23; Creat 1.18; Potassium 3.9; Sodium 138   Lipid Panel     Component Value Date/Time   CHOL 136 12/21/2014 0951   TRIG 130.0 12/21/2014 0951   HDL 41.60 12/21/2014 0951   CHOLHDL 3 12/21/2014 0951   VLDL 26.0 12/21/2014 0951   LDLCALC 68 12/21/2014 0951   LDLDIRECT 115.6 11/28/2012 0947      Wt Readings from Last 3 Encounters:  09/10/15 189 lb (85.73 kg)  04/20/15 182 lb (82.555 kg)  04/14/15 181 lb 11.2 oz  (82.419 kg)    Cardiac Studies Reviewed: Myoview Stress Test 6.27.2016: Study Highlights     Nuclear stress EF: 47%.  There was no ST segment deviation noted during stress.  The study is normal.  This is a low risk study.  The left ventricular ejection fraction is mildly decreased (45-54%).  No scar or reversible ischemia seen. There is apical thinning more notable on the resting study. There is mild LV systolic dysfunction without segmental wall motion abnormalities.   ASSESSMENT AND PLAN: 1.  CAD, native vessel: normal perfusion on recent stress nuclear scan. No anginal symptoms. I personally reviewed his cath images today and he had mild nonobstructive CAD  with plaquing in the LAD. With LVEF 47% on nuclear scan last year, I recommend a 2D Echo to make sure there is not significant LV dysfunction.   2. Essential HTN: BP well-controlled on current Rx.   3. Hyperlipidemia: lipids at goal and he's been off of a statin drug for a long time. Continue lifestyle modification.   4. Hypokalemia: will update labs today and consider potassium supplementation/discontinuation of HCTZ depending on results of labs.   4. Tobacco abuse: quit  Current medicines are reviewed with the patient today.  The patient does not have concerns regarding medicines.  Labs/ tests ordered today include:   Orders Placed This Encounter  Procedures  . Basic Metabolic Panel (BMET)  . EKG 12-Lead  . Echocardiogram    Disposition:   FU one year  Signed, Sherren Mocha, MD  09/11/2015 12:04 AM    [facility] [place], [place], [place]  [address] Phone: [phone]; Fax: [phone]

## 2015-09-10 NOTE — Patient Instructions (Addendum)
Medication Instructions:  Your physician recommends that you continue on your current medications as directed. Please refer to the Current Medication list given to you today.  Labwork: Your physician recommends that you have lab work today: BMP  Testing/Procedures: Your physician has requested that you have an echocardiogram. Echocardiography is a painless test that uses sound waves to create images of your heart. It provides your doctor with information about the size and shape of your heart and how well your heart's chambers and valves are working. This procedure takes approximately one hour. There are no restrictions for this procedure.  Follow-Up: Your physician wants you to follow-up in: 1 YEAR with Dr Burt Knack.  You will receive a reminder letter in the mail two months in advance. If you don't receive a letter, please call our office to schedule the follow-up appointment.   Any Other Special Instructions Will Be Listed Below (If Applicable).     If you need a refill on your cardiac medications before your next appointment, please call your pharmacy.

## 2015-09-14 ENCOUNTER — Encounter: Payer: Self-pay | Admitting: Cardiovascular Disease

## 2015-09-14 ENCOUNTER — Ambulatory Visit: Payer: PPO | Admitting: Physical Therapy

## 2015-09-14 NOTE — Telephone Encounter (Signed)
New Message  Pt called for the results of his labs. Pt states that he was advised that his potassium levels were severly low.

## 2015-09-14 NOTE — Telephone Encounter (Signed)
This encounter was created in error - please disregard.

## 2015-09-16 ENCOUNTER — Ambulatory Visit: Payer: PPO | Admitting: Physical Therapy

## 2015-09-17 DIAGNOSIS — M5416 Radiculopathy, lumbar region: Secondary | ICD-10-CM | POA: Diagnosis not present

## 2015-09-17 DIAGNOSIS — M5136 Other intervertebral disc degeneration, lumbar region: Secondary | ICD-10-CM | POA: Diagnosis not present

## 2015-09-17 DIAGNOSIS — M4310 Spondylolisthesis, site unspecified: Secondary | ICD-10-CM | POA: Diagnosis not present

## 2015-09-17 DIAGNOSIS — M961 Postlaminectomy syndrome, not elsewhere classified: Secondary | ICD-10-CM | POA: Diagnosis not present

## 2015-09-20 ENCOUNTER — Ambulatory Visit: Payer: PPO

## 2015-09-20 ENCOUNTER — Ambulatory Visit (HOSPITAL_COMMUNITY): Payer: PPO | Attending: Cardiology

## 2015-09-20 ENCOUNTER — Other Ambulatory Visit: Payer: Self-pay

## 2015-09-20 DIAGNOSIS — I2510 Atherosclerotic heart disease of native coronary artery without angina pectoris: Secondary | ICD-10-CM | POA: Diagnosis not present

## 2015-09-20 DIAGNOSIS — R0602 Shortness of breath: Secondary | ICD-10-CM | POA: Insufficient documentation

## 2015-09-20 DIAGNOSIS — I119 Hypertensive heart disease without heart failure: Secondary | ICD-10-CM | POA: Diagnosis not present

## 2015-09-20 DIAGNOSIS — E785 Hyperlipidemia, unspecified: Secondary | ICD-10-CM | POA: Insufficient documentation

## 2015-09-20 DIAGNOSIS — I351 Nonrheumatic aortic (valve) insufficiency: Secondary | ICD-10-CM | POA: Insufficient documentation

## 2015-09-20 DIAGNOSIS — I059 Rheumatic mitral valve disease, unspecified: Secondary | ICD-10-CM | POA: Diagnosis not present

## 2015-09-27 ENCOUNTER — Encounter: Payer: PPO | Admitting: Physical Therapy

## 2015-10-11 ENCOUNTER — Ambulatory Visit: Payer: PPO | Admitting: Gastroenterology

## 2015-10-11 ENCOUNTER — Ambulatory Visit (INDEPENDENT_AMBULATORY_CARE_PROVIDER_SITE_OTHER): Payer: PPO | Admitting: Gastroenterology

## 2015-10-11 ENCOUNTER — Encounter: Payer: Self-pay | Admitting: Gastroenterology

## 2015-10-11 VITALS — BP 150/88 | HR 84 | Ht 71.0 in | Wt 192.4 lb

## 2015-10-11 DIAGNOSIS — K219 Gastro-esophageal reflux disease without esophagitis: Secondary | ICD-10-CM

## 2015-10-11 MED ORDER — PANTOPRAZOLE SODIUM 40 MG PO TBEC: 40.0000 mg | DELAYED_RELEASE_TABLET | Freq: Two times a day (BID) | ORAL | Status: DC

## 2015-10-11 NOTE — Progress Notes (Signed)
Review of pertinent gastrointestinal problems:  1. GERD, intermittent dysphagia that is likely GERD related. EGD February 2010 found 2 cm hiatal hernia, no stricture, slightly irregular Z line that was biopsied and biopsies showed no sign of intestinal metaplasia.  2. Adenomatous polyps: colonoscopy with SML in 03/2004 found 67m HP polyp. Repeat colonoscopy 2013 ([facility]), done for new bleeding found two polyps, hemorrhoids. Polyps were adenomas on pathology. Recommended recall colonoscopy at 5 year interval. 3. Gallstone disease: acute cholecystitis, he underwent laparoscopic cholecystectomy January 2015. The intraoperative cholangiogram suggested filling defects in the common bile duct. ERCP done by Dr. JArdis HughsJanuary 2015 performed sphincterotomy and stone removal went smoothly.  HPI: This is a   very pleasant 65year old man whom I last saw the time of ERCP about 2 years ago.   Chief complaint is chronic GERD  Pyrosis a bit of a flare lately.    He describes pyrosis especially in the morning hours. He mention again chocolate and peppermint R big offenders  He needs twice daily protonix 422musually however not taking them at correct time in relation to eating meals.    No dysphagia.  Weight going up slowly.  Past Medical History  Diagnosis Date  . HYPERLIPIDEMIA 04/09/2007  . HYPERTENSION, BENIGN 04/19/2010  . GERD 04/09/2007  . RENAL CYST 05/27/2010  . OSTEOARTHRITIS, LUMBAR SPINE 04/09/2007  . LUMBAR DISC DISORDER 05/27/2010  . Headache(784.0)   . CAD, NATIVE VESSEL 04/19/2010    nonobstructive by cath 2007:  oLAD 20-30%, mLAD 50%, pCFX 20-30%, oAVCFX 20-30%, L renal art 50%;  normal LVF  . Nerve damage     Left leg  . Spinal headache     After having back surgery in 2014  . Hypothyroidism   . Hypothyroidism     previous hyperthyroidism, s/p I-131    Past Surgical History  Procedure Laterality Date  . Spine surgery  2006    C-spine surgery x 2  . Neck surgery      X  2  . Lumbar disc surgery  08/06/2012    L3 & L4  . Foot fracture surgery    . Lumbar laminectomy/decompression microdiscectomy Left 08/10/2012    Procedure: Dura Repair Left Side L2-L3;  Surgeon: JaJessy OtoMD;  [facility] Service: Orthopedics;  Laterality: Left;  Wilson Frame, Sliding table, dura repair kit, microscope  . Ercp N/A 07/14/2013    Procedure: ENDOSCOPIC RETROGRADE CHOLANGIOPANCREATOGRAPHY (ERCP);  Surgeon: DaMilus BanisterMD;  [facility];  Service: Endoscopy;  Laterality: N/A;  . Cardiac catheterization  2007    Dr ElLoanne Drilling. Fracture surgery    . Back surgery  2012,2014  . Lumbar disc surgery  11/11/2015    L1 L2    DR NITKA  . Lumbar laminectomy N/A 11/10/2013    Procedure: Left L1-2 far lateral approach to excise herniated nucleus pulposus;  Surgeon: JaJessy OtoMD;  [facility] Service: [facility]opedics;  Laterality: N/A;  . Colonoscopy w/ polypectomy    . Cholecystectomy    . Cholecystectomy N/A 07/13/2013    Procedure: LAPAROSCOPIC CHOLECYSTECTOMY WITH INTRAOPERATIVE CHOLANGIOGRAM;  Surgeon: DaShann MedalMD;  [facility];  Service: General;  Laterality: N/A;  . Eye surgery Bilateral     Cataract removal  . Lumbar fusion N/A 04/20/2015    Procedure: T12 to L1 fusion (Extension of Previous Fusion L2-S1 to T12-S1), Right Transforaminal lumbar interbody fusion, Posterior Fusion T12 to L1, with Pedicle screws, allograft, local  bone graft, and  Vivigen;  Surgeon: [name], MD;  Location: [facility];  Service: Orthopedics;  Laterality: N/A;    Current Outpatient Prescriptions  Medication Sig Dispense Refill  . levothyroxine (SYNTHROID, LEVOTHROID) 100 MCG tablet Take 1 tablet (100 mcg total) by mouth daily. 90 tablet 3  . losartan-hydrochlorothiazide (HYZAAR) 100-12.5 MG tablet take 1 tablet by mouth once daily 30 tablet 3  . metoprolol succinate (TOPROL-XL) 25 MG 24 hr tablet take 1 tablet by mouth once daily 30 tablet 3  . Oxycodone HCl 20  MG TABS Reported on 09/10/2015  0  . pantoprazole (PROTONIX) 40 MG tablet Take 1 tablet (40 mg total) by mouth 2 (two) times daily. Before meals 30 tablet 11  . potassium chloride (K-DUR) 10 MEQ tablet   1   No current facility-administered medications for this visit.    Allergies as of 10/11/2015 - Review Complete 10/11/2015  Allergen Reaction Noted  . Ceftin Other (See Comments) 07/31/2011  . Gabapentin  04/14/2015    Family History  Problem Relation Age of Onset  . Cancer Mother     Breast Cancer, Uterine Cancer  . Breast cancer Mother   . Uterine cancer Mother   . Heart disease Mother   . Heart attack Mother   . Hypertension Brother   . Thyroid disease Neg Hx     Social History   Social History  . Marital Status: Single    Spouse Name: N/A  . Number of Children: 1  . Years of Education: N/A   Occupational History  . [employer]    Social History Main Topics  . Smoking status: Former Smoker -- 0.50 packs/day for 44 years    Types: Cigarettes    Quit date: 04/06/2012  . Smokeless tobacco: Never Used     Comment: pt has stopped smoking about 9 months now  . Alcohol Use: No  . Drug Use: No  . Sexual Activity: Not Currently   Other Topics Concern  . Not on file   Social History Narrative   Works in Psychologist, educational. Retired.   Lives alone, has one child in [place].      Physical Exam: BP 150/88 mmHg  Pulse 84  Ht _0  (1.803 m)  Wt 192 lb 6.4 oz (87.272 kg)  BMI 26.85 kg/m2 Constitutional: generally well-appearing Psychiatric: alert and oriented x3 Abdomen: soft, nontender, nondistended, no obvious ascites, no peritoneal signs, normal bowel sounds   Assessment and plan: 65 y.o. male with GERD without alarm symptoms  We will call in proton pump inhibitor prescription for him as refills. He has not been taking these at the correct time and relation to meals and he will adjust accordingly. He may be able to go down to one pill once daily if it is  take at the correct time and relation to meals area he does I would like to see him in 2 years if he still requires prescription strength antiacid medicines. Sooner if any problems.   Owens Loffler, MD [facility] 10/11/2015, 8:51 AM

## 2015-10-11 NOTE — Patient Instructions (Signed)
New prescription called in for protonix 40mg  twice daily; try to take these 20-30 minutes prior to a meal.  60 pills, 11 refills. Return to see Dr. Ardis Hughs in 2 years if you still need prescription strength anti-acid medicines.

## 2015-10-14 ENCOUNTER — Encounter: Payer: Self-pay | Admitting: Endocrinology

## 2015-10-14 ENCOUNTER — Ambulatory Visit (INDEPENDENT_AMBULATORY_CARE_PROVIDER_SITE_OTHER): Payer: PPO | Admitting: Endocrinology

## 2015-10-14 VITALS — BP 138/100 | HR 71 | Ht 71.0 in | Wt 191.0 lb

## 2015-10-14 DIAGNOSIS — E890 Postprocedural hypothyroidism: Secondary | ICD-10-CM | POA: Diagnosis not present

## 2015-10-14 DIAGNOSIS — E119 Type 2 diabetes mellitus without complications: Secondary | ICD-10-CM

## 2015-10-14 DIAGNOSIS — E876 Hypokalemia: Secondary | ICD-10-CM

## 2015-10-14 LAB — BASIC METABOLIC PANEL
BUN: 26 mg/dL — AB (ref 6–23)
CALCIUM: 9.2 mg/dL (ref 8.4–10.5)
CO2: 25 mEq/L (ref 19–32)
Chloride: 100 mEq/L (ref 96–112)
Creatinine, Ser: 1.19 mg/dL (ref 0.40–1.50)
GFR: 65.51 mL/min (ref >60.00)
GLUCOSE: 162 mg/dL — AB (ref 70–99)
Potassium: 3.5 mEq/L (ref 3.5–5.1)
Sodium: 137 mEq/L (ref 135–145)

## 2015-10-14 LAB — MAGNESIUM: Magnesium: 2.1 mg/dL (ref 1.5–2.5)

## 2015-10-14 LAB — HEMOGLOBIN A1C: HEMOGLOBIN A1C: 6.8 % — AB (ref 4.6–6.5)

## 2015-10-14 LAB — VITAMIN D 25 HYDROXY (VIT D DEFICIENCY, FRACTURES): VITD: 16.53 ng/mL — ABNORMAL LOW (ref 30.00–100.00)

## 2015-10-14 LAB — TSH: TSH: 6.49 u[IU]/mL — ABNORMAL HIGH (ref 0.35–4.50)

## 2015-10-14 MED ORDER — VITAMIN D (ERGOCALCIFEROL) 1.25 MG (50000 UNIT) PO CAPS: 50000.0000 [IU] | ORAL_CAPSULE | ORAL | Status: DC

## 2015-10-14 MED ORDER — LEVOTHYROXINE SODIUM 112 MCG PO TABS: 112.0000 ug | ORAL_TABLET | Freq: Every day | ORAL | Status: DC

## 2015-10-14 NOTE — Patient Instructions (Signed)
blood tests are requested for you today.  We'll let you know about the results.  

## 2015-10-14 NOTE — Progress Notes (Signed)
Subjective:    Patient ID: Jeff Lopez, male    DOB: Jul 02, 1950, 65 y.o.   MRN: [id number]  HPI Pt states few mos of moderate cramps throughout the body, but no assoc numbness.   He says he never misses the BP medication, but he sometimes misses the [facility].   Past Medical History  Diagnosis Date  . HYPERLIPIDEMIA 04/09/2007  . HYPERTENSION, BENIGN 04/19/2010  . GERD 04/09/2007  . RENAL CYST 05/27/2010  . OSTEOARTHRITIS, LUMBAR SPINE 04/09/2007  . LUMBAR DISC DISORDER 05/27/2010  . Headache(784.0)   . CAD, NATIVE VESSEL 04/19/2010    nonobstructive by cath 2007:  oLAD 20-30%, mLAD 50%, pCFX 20-30%, oAVCFX 20-30%, L renal art 50%;  normal LVF  . Nerve damage     Left leg  . Spinal headache     After having back surgery in 2014  . Hypothyroidism   . Hypothyroidism     previous hyperthyroidism, s/p I-131    Past Surgical History  Procedure Laterality Date  . Spine surgery  2006    C-spine surgery x 2  . Neck surgery      X 2  . Lumbar disc surgery  08/06/2012    L3 & L4  . Foot fracture surgery    . Lumbar laminectomy/decompression microdiscectomy Left 08/10/2012    Procedure: Dura Repair Left Side L2-L3;  Surgeon: [name], MD;  Location: [place];  Service: Orthopedics;  Laterality: Left;  Wilson Frame, Sliding table, dura repair kit, microscope  . Ercp N/A 07/14/2013    Procedure: ENDOSCOPIC RETROGRADE CHOLANGIOPANCREATOGRAPHY (ERCP);  Surgeon: [name], MD;  Location: [facility];  Service: Endoscopy;  Laterality: N/A;  . Cardiac catheterization  2007    Dr Loanne Drilling  . Fracture surgery    . Back surgery  2012,2014  . Lumbar disc surgery  11/11/2015    L1 L2    DR NITKA  . Lumbar laminectomy N/A 11/10/2013    Procedure: Left L1-2 far lateral approach to excise herniated nucleus pulposus;  Surgeon: [name], MD;  Location: [place];  Service: Orthopedics;  Laterality: N/A;  . Colonoscopy w/ polypectomy    . Cholecystectomy    . Cholecystectomy N/A 07/13/2013      Procedure: LAPAROSCOPIC CHOLECYSTECTOMY WITH INTRAOPERATIVE CHOLANGIOGRAM;  Surgeon: Shann Medal, MD;  [facility];  Service: General;  Laterality: N/A;  . Eye surgery Bilateral     Cataract removal  . Lumbar fusion N/A 04/20/2015    Procedure: T12 to L1 fusion (Extension of Previous Fusion L2-S1 to T12-S1), Right Transforaminal lumbar interbody fusion, Posterior Fusion T12 to L1, with Pedicle screws, allograft, local bone graft, and  Vivigen;  Surgeon: [name], MD;  Location: [place];  Service: Orthopedics;  Laterality: N/A;    Social History   Social History  . Marital Status: Single    Spouse Name: N/A  . Number of Children: 1  . Years of Education: N/A   Occupational History  . [employer]    Social History Main Topics  . Smoking status: Former Smoker -- 0.50 packs/day for 44 years    Types: Cigarettes    Quit date: 04/06/2012  . Smokeless tobacco: Never Used     Comment: pt has stopped smoking about 9 months now  . Alcohol Use: No  . Drug Use: No  . Sexual Activity: Not Currently   Other Topics Concern  . Not on file   Social History Narrative   Works in  manufacturing. Retired.   Lives alone, has one child in [place].     Current Outpatient Prescriptions on File Prior to Visit  Medication Sig Dispense Refill  . losartan-hydrochlorothiazide (HYZAAR) 100-12.5 MG tablet take 1 tablet by mouth once daily 30 tablet 3  . metoprolol succinate (TOPROL-XL) 25 MG 24 hr tablet take 1 tablet by mouth once daily 30 tablet 3  . Oxycodone HCl 20 MG TABS Reported on 09/10/2015  0  . pantoprazole (PROTONIX) 40 MG tablet Take 1 tablet (40 mg total) by mouth 2 (two) times daily. Before meals 60 tablet 11  . potassium chloride (K-DUR) 10 MEQ tablet   1   No current facility-administered medications on file prior to visit.    Allergies  Allergen Reactions  . Ceftin Other (See Comments)    Patient stated it caused "sores in mouth"  . Gabapentin     Causes  upset stomach; pt unsure if Gabapentin or Unisom causes upset stomach    Family History  Problem Relation Age of Onset  . Cancer Mother     Breast Cancer, Uterine Cancer  . Breast cancer Mother   . Uterine cancer Mother   . Heart disease Mother   . Heart attack Mother   . Hypertension Brother   . Thyroid disease Neg Hx     BP 138/100 mmHg  Pulse 71  Ht _0  (1.803 m)  Wt 191 lb (86.637 kg)  BMI 26.65 kg/m2  SpO2 96%    Review of Systems No change in chronic low back pain.  Denies sob.      Objective:   Physical Exam VITAL SIGNS:  See vs page GENERAL: no distress Pulses: dorsalis pedis intact bilat.   MSK: no deformity of the feet CV: no leg edema Skin:  no ulcer on the feet.  normal color and temp on the feet. Neuro: sensation is intact to touch on the feet.    Lab Results  Component Value Date   TSH 6.49* 10/14/2015  vit-D is low  Lab Results  Component Value Date   CREATININE 1.19 10/14/2015   BUN 26* 10/14/2015   NA 137 10/14/2015   K 3.5 10/14/2015   CL 100 10/14/2015   CO2 25 10/14/2015      Assessment & Plan:  Vit-D deficiency, new Hypothyroidism: he needs increased rx Hypokalemia: therapy limited by noncompliance.  i'll do the best i can.   Patient is advised the following: Patient Instructions  blood tests are requested for you today.  We'll let you know about the results.   addendum: i rx'ed vit-D and increased synthroid.  Take KLOR as rx'ed.

## 2015-11-08 ENCOUNTER — Telehealth: Payer: Self-pay | Admitting: Endocrinology

## 2015-11-08 DIAGNOSIS — E890 Postprocedural hypothyroidism: Secondary | ICD-10-CM

## 2015-11-08 DIAGNOSIS — E876 Hypokalemia: Secondary | ICD-10-CM

## 2015-11-08 NOTE — Telephone Encounter (Signed)
I contacted the pt. Pt stated since his last office visit his muscle spasm have not gotten any better. Pt would like to know what is recommended next?

## 2015-11-08 NOTE — Telephone Encounter (Signed)
Please come in to recheck the 3 blood tests (potassium, vit-D, and thyroid), to see if these are improving.  i have ordered

## 2015-11-08 NOTE — Telephone Encounter (Signed)
PT said he was returning your call, he said that the new medications he is on are not working well

## 2015-11-09 NOTE — Telephone Encounter (Signed)
I contacted the pt and advised of note below. Pt voiced understanding and scheduled his lab appointment for 11/10/15.

## 2015-11-10 ENCOUNTER — Other Ambulatory Visit: Payer: Self-pay | Admitting: Endocrinology

## 2015-11-10 ENCOUNTER — Other Ambulatory Visit (INDEPENDENT_AMBULATORY_CARE_PROVIDER_SITE_OTHER): Payer: PPO

## 2015-11-10 DIAGNOSIS — E890 Postprocedural hypothyroidism: Secondary | ICD-10-CM | POA: Diagnosis not present

## 2015-11-10 DIAGNOSIS — E876 Hypokalemia: Secondary | ICD-10-CM | POA: Diagnosis not present

## 2015-11-10 LAB — BASIC METABOLIC PANEL
BUN: 20 mg/dL (ref 6–23)
CALCIUM: 9 mg/dL (ref 8.4–10.5)
CO2: 26 meq/L (ref 19–32)
Chloride: 104 mEq/L (ref 96–112)
Creatinine, Ser: 1.17 mg/dL (ref 0.40–1.50)
GFR: 66.79 mL/min (ref >60.00)
GLUCOSE: 133 mg/dL — AB (ref 70–99)
POTASSIUM: 3.8 meq/L (ref 3.5–5.1)
SODIUM: 139 meq/L (ref 135–145)

## 2015-11-10 LAB — VITAMIN D 25 HYDROXY (VIT D DEFICIENCY, FRACTURES): VITD: 30.04 ng/mL (ref 30.00–100.00)

## 2015-11-10 LAB — TSH: TSH: 6.06 u[IU]/mL — AB (ref 0.35–4.50)

## 2015-11-10 MED ORDER — LEVOTHYROXINE SODIUM 125 MCG PO TABS: 125.0000 ug | ORAL_TABLET | Freq: Every day | ORAL | Status: DC

## 2015-11-11 HISTORY — PX: LUMBAR DISC SURGERY: SHX700

## 2015-11-11 LAB — PTH, INTACT AND CALCIUM
Calcium: 8.4 mg/dL (ref 8.4–10.5)
PTH: 51 pg/mL (ref 14–64)

## 2015-11-15 ENCOUNTER — Other Ambulatory Visit: Payer: Self-pay | Admitting: *Deleted

## 2015-11-15 MED ORDER — LOSARTAN POTASSIUM-HCTZ 100-12.5 MG PO TABS: 1.0000 | ORAL_TABLET | Freq: Every day | ORAL | Status: DC

## 2015-11-15 MED ORDER — METOPROLOL SUCCINATE ER 25 MG PO TB24: 25.0000 mg | ORAL_TABLET | Freq: Every day | ORAL | Status: DC

## 2015-12-11 ENCOUNTER — Other Ambulatory Visit: Payer: Self-pay | Admitting: Endocrinology

## 2015-12-22 DIAGNOSIS — M4310 Spondylolisthesis, site unspecified: Secondary | ICD-10-CM | POA: Diagnosis not present

## 2015-12-22 DIAGNOSIS — M5416 Radiculopathy, lumbar region: Secondary | ICD-10-CM | POA: Diagnosis not present

## 2016-02-23 DIAGNOSIS — M4310 Spondylolisthesis, site unspecified: Secondary | ICD-10-CM | POA: Diagnosis not present

## 2016-02-23 DIAGNOSIS — M961 Postlaminectomy syndrome, not elsewhere classified: Secondary | ICD-10-CM | POA: Diagnosis not present

## 2016-02-23 DIAGNOSIS — M5416 Radiculopathy, lumbar region: Secondary | ICD-10-CM | POA: Diagnosis not present

## 2016-03-24 DIAGNOSIS — M461 Sacroiliitis, not elsewhere classified: Secondary | ICD-10-CM | POA: Diagnosis not present

## 2016-03-24 DIAGNOSIS — M5416 Radiculopathy, lumbar region: Secondary | ICD-10-CM | POA: Diagnosis not present

## 2016-03-24 DIAGNOSIS — M961 Postlaminectomy syndrome, not elsewhere classified: Secondary | ICD-10-CM | POA: Diagnosis not present

## 2016-03-24 DIAGNOSIS — M4310 Spondylolisthesis, site unspecified: Secondary | ICD-10-CM | POA: Diagnosis not present

## 2016-03-24 DIAGNOSIS — M5136 Other intervertebral disc degeneration, lumbar region: Secondary | ICD-10-CM | POA: Diagnosis not present

## 2016-04-10 ENCOUNTER — Other Ambulatory Visit: Payer: Self-pay | Admitting: Endocrinology

## 2016-04-20 ENCOUNTER — Telehealth (INDEPENDENT_AMBULATORY_CARE_PROVIDER_SITE_OTHER): Payer: Self-pay | Admitting: *Deleted

## 2016-04-21 ENCOUNTER — Ambulatory Visit (INDEPENDENT_AMBULATORY_CARE_PROVIDER_SITE_OTHER): Payer: Self-pay | Admitting: Specialist

## 2016-04-21 NOTE — Telephone Encounter (Signed)
ERROR

## 2016-04-26 ENCOUNTER — Encounter (INDEPENDENT_AMBULATORY_CARE_PROVIDER_SITE_OTHER): Payer: Self-pay | Admitting: Specialist

## 2016-04-26 ENCOUNTER — Encounter (INDEPENDENT_AMBULATORY_CARE_PROVIDER_SITE_OTHER): Payer: Self-pay

## 2016-04-26 ENCOUNTER — Ambulatory Visit (INDEPENDENT_AMBULATORY_CARE_PROVIDER_SITE_OTHER): Payer: PPO | Admitting: Specialist

## 2016-04-26 VITALS — BP 180/98 | HR 62 | Ht 71.0 in | Wt 190.0 lb

## 2016-04-26 DIAGNOSIS — M533 Sacrococcygeal disorders, not elsewhere classified: Secondary | ICD-10-CM

## 2016-04-26 DIAGNOSIS — M5442 Lumbago with sciatica, left side: Secondary | ICD-10-CM

## 2016-04-26 DIAGNOSIS — G8929 Other chronic pain: Secondary | ICD-10-CM | POA: Diagnosis not present

## 2016-04-26 MED ORDER — OXYCODONE HCL 20 MG PO TABS: 20.0000 mg | ORAL_TABLET | Freq: Three times a day (TID) | ORAL | 0 refills | Status: DC | PRN

## 2016-04-26 NOTE — Patient Instructions (Signed)
Avoid frequent bending and stooping and lifting. Walking in a swimming pool or a stationary bike for exercise. We are trying to get a referral to pain management arranged. Oxycodone 20 mg po BID or TID for chronic pain. Ice or moist heat are okay for pain relief. Use the TENS unit as needed. Call if there is worsening pain, any worsening weakness or increasing numbness. Ask for may assistant if you need to be seen.

## 2016-04-26 NOTE — Progress Notes (Addendum)
Office Visit Note   Patient: Jeff Lopez           Date of Birth: 1951/05/16           MRN: [id number] Visit Date: 04/26/2016              Requested by: Renato Shin, MD [address] [place], [place] [address] PCP: Renato Shin, MD   Assessment & Plan: Visit Diagnoses:  1. Chronic left-sided low back pain with left-sided sciatica   2. Sacroiliac joint disease     Plan:Avoid frequent bending and stooping and lifting. Walking in a swimming pool or a stationary bike for exercise. We are trying to get a referral to pain management arranged. Oxycodone 20 mg po BID or TID for chronic pain. Ice or moist heat are okay for pain relief. Use the TENS unit as needed. Call if there is worsening pain, any worsening weakness or increasing numbness. Ask for may assistant if you need to be seen.   Follow-Up Instructions: Return in about 6 months (around 10/24/2016) for follow up of chronic pain, post lumbar laminectomy.   Orders:  No orders of the defined types were placed in this encounter.  No orders of the defined types were placed in this encounter.     Procedures: No procedures performed   Clinical Data: No additional findings.   Subjective: Chief Complaint  Patient presents with  . Lower Back - Follow-up    Patient is following up from SI joint injection that was done on 03/24/2016 with Dr. Ernestina Patches. Patient states injection helped for a few days, but pain has returned. Complains that he is stiff first thing in the morning, but gets better once he gets up and moving around. Still experiencing pain in left leg, feels like this is chronic nerve injury pain. No bowel or bladder problems. Walking for any length of time increases the pain. Last xrays T12-S1 fusion with cages L1-2 through L5 S1. Stopped PT absent [facility] where they were performing soft tissue releases and dry needling 4 areas of fibrosis and muscular injury to the lumbar spine,  he was released with therapy indicating that they did not feel his leg is so they can provide further improvement. Wants to go longer before any further office visits. SI joint injection relieved maybe 30% of the pain right SI area. Consistent pain in the left leg. Dr. Posey Pronto did EMGs and gave med for restless leg syndrome and discussed use of gabapentin which he has already tried. He has also tried gabapentin in the past without relief does not wish to try Tri-Cyclic antidepressant agents. He does have a TENS unit that he has used in the past and it does provide some improvement in discomfort. Also has had discussion regarding possible indwelling spinal cord stimulator neurogenic pain in the left leg.    Review of Systems   Objective: Vital Signs: BP (!) 180/98   Pulse 62   Ht '5\' 11"'  (1.803 m)   Wt 190 lb (86.2 kg)   BMI 26.50 kg/m   Physical Exam  Ortho Exam  Specialty Comments:  No specialty comments available.  Imaging: No results found.   PMFS History: Patient Active Problem List   Diagnosis Date Noted  . Spinal stenosis, lumbar region, with neurogenic claudication 04/21/2015    Priority: High    Class: Chronic  . Spondylolisthesis of lumbar region 04/21/2015    Priority: High    Class: Chronic  . Postoperative  CSF leak 08/10/2012    Priority: High    Class: Acute  . HNP (herniated nucleus pulposus), lumbar 08/06/2012    Priority: High    Class: Acute  . Hypokalemia 10/14/2015  . Hypocalcemia 10/14/2015  . Hypothyroidism following radioiodine therapy 03/10/2015  . Screening for prostate cancer 03/10/2015  . Hemochromatosis 03/27/2014  . Other abnormal blood chemistry 02/12/2014  . Choledocholithiasis 07/14/2013  . Nonspecific (abnormal) findings on radiological and other examination of biliary tract 07/13/2013  . Calculus of gallbladder with acute cholecystitis, without mention of obstruction 07/13/2013  . Calculus of bile duct without mention of cholecystitis or  obstruction 07/13/2013  . Abdominal pain 07/12/2013  . Neck pain on left side 05/30/2013  . Quit smoking 11/28/2012  . Degenerative disc disease, lumbar 08/06/2012    Class: Chronic  . Diabetes ([place]) 06/10/2012  . COPD GOLD 0 02/09/2012  . Chest pain at rest 02/06/2012  . Allergic rhinitis, cause unspecified 02/06/2012  . Radiculopathy of leg 11/05/2010  . Cough 08/23/2010  . RENAL CYST 05/27/2010  . LUMBAR DISC DISORDER 05/27/2010  . HYPERTENSION, BENIGN 04/19/2010  . CAD, NATIVE VESSEL 04/19/2010  . HYPERLIPIDEMIA 04/09/2007  . GERD 04/09/2007  . OSTEOARTHRITIS, LUMBAR SPINE 04/09/2007   Past Medical History:  Diagnosis Date  . CAD, NATIVE VESSEL 04/19/2010   nonobstructive by cath 2007:  oLAD 20-30%, mLAD 50%, pCFX 20-30%, oAVCFX 20-30%, L renal art 50%;  normal LVF  . GERD 04/09/2007  . Headache(784.0)   . HYPERLIPIDEMIA 04/09/2007  . HYPERTENSION, BENIGN 04/19/2010  . Hypothyroidism   . Hypothyroidism    previous hyperthyroidism, s/p I-131  . LUMBAR DISC DISORDER 05/27/2010  . Nerve damage    Left leg  . OSTEOARTHRITIS, LUMBAR SPINE 04/09/2007  . RENAL CYST 05/27/2010  . Spinal headache    After having back surgery in 2014    Family History  Problem Relation Age of Onset  . Cancer Mother     Breast Cancer, Uterine Cancer  . Breast cancer Mother   . Uterine cancer Mother   . Heart disease Mother   . Heart attack Mother   . Hypertension Brother   . Thyroid disease Neg Hx     Past Surgical History:  Procedure Laterality Date  . BACK SURGERY  2012,2014  . CARDIAC CATHETERIZATION  2007   Dr Loanne Drilling  . CHOLECYSTECTOMY    . CHOLECYSTECTOMY N/A 07/13/2013   Procedure: LAPAROSCOPIC CHOLECYSTECTOMY WITH INTRAOPERATIVE CHOLANGIOGRAM;  Surgeon: Shann Medal, MD;  [facility];  Service: General;  Laterality: N/A;  . COLONOSCOPY W/ POLYPECTOMY    . ERCP N/A 07/14/2013   Procedure: ENDOSCOPIC RETROGRADE CHOLANGIOPANCREATOGRAPHY (ERCP);  Surgeon: [name],  MD;  Location: [facility];  Service: Endoscopy;  Laterality: N/A;  . EYE SURGERY Bilateral    Cataract removal  . FOOT FRACTURE SURGERY    . FRACTURE SURGERY    . LUMBAR DISC SURGERY  08/06/2012   L3 & L4  . LUMBAR DISC SURGERY  11/11/2015   L1 L2    DR Isaiah Torok  . LUMBAR FUSION N/A 04/20/2015   Procedure: T12 to L1 fusion (Extension of Previous Fusion L2-S1 to T12-S1), Right Transforaminal lumbar interbody fusion, Posterior Fusion T12 to L1, with Pedicle screws, allograft, local bone graft, and  Vivigen;  Surgeon: [name], MD;  Location: [place];  Service: Orthopedics;  Laterality: N/A;  . LUMBAR LAMINECTOMY N/A 11/10/2013   Procedure: Left L1-2 far lateral approach to excise herniated nucleus pulposus;  Surgeon: Jeneen Rinks  Howell Pringle, MD;  Location: [place];  Service: Orthopedics;  Laterality: N/A;  . LUMBAR LAMINECTOMY/DECOMPRESSION MICRODISCECTOMY Left 08/10/2012   Procedure: Dura Repair Left Side L2-L3;  Surgeon: [name], MD;  Location: [place];  Service: Orthopedics;  Laterality: Left;  Wilson Frame, Sliding table, dura repair kit, microscope  . NECK SURGERY     X 2  . SPINE SURGERY  2006   C-spine surgery x 2   Social History   Occupational History  . [employer]   Social History Main Topics  . Smoking status: Former Smoker    Packs/day: 0.50    Years: 44.00    Types: Cigarettes    Quit date: 04/06/2012  . Smokeless tobacco: Never Used     Comment: pt has stopped smoking about 9 months now  . Alcohol use No  . Drug use: No  . Sexual activity: Not Currently

## 2016-05-17 ENCOUNTER — Other Ambulatory Visit: Payer: Self-pay

## 2016-05-17 MED ORDER — POTASSIUM CHLORIDE ER 10 MEQ PO TBCR: 10.0000 meq | EXTENDED_RELEASE_TABLET | Freq: Every day | ORAL | 2 refills | Status: DC

## 2016-05-22 ENCOUNTER — Other Ambulatory Visit: Payer: Self-pay | Admitting: *Deleted

## 2016-05-22 ENCOUNTER — Ambulatory Visit (INDEPENDENT_AMBULATORY_CARE_PROVIDER_SITE_OTHER): Payer: Self-pay | Admitting: Specialist

## 2016-05-22 MED ORDER — LOSARTAN POTASSIUM-HCTZ 100-12.5 MG PO TABS: 1.0000 | ORAL_TABLET | Freq: Every day | ORAL | 3 refills | Status: DC

## 2016-05-22 MED ORDER — METOPROLOL SUCCINATE ER 25 MG PO TB24: 25.0000 mg | ORAL_TABLET | Freq: Every day | ORAL | 3 refills | Status: DC

## 2016-06-01 DIAGNOSIS — H43812 Vitreous degeneration, left eye: Secondary | ICD-10-CM | POA: Diagnosis not present

## 2016-06-01 DIAGNOSIS — H26491 Other secondary cataract, right eye: Secondary | ICD-10-CM | POA: Diagnosis not present

## 2016-06-01 DIAGNOSIS — H26492 Other secondary cataract, left eye: Secondary | ICD-10-CM | POA: Diagnosis not present

## 2016-06-04 NOTE — Progress Notes (Signed)
Subjective:    Patient ID: Jeff Lopez, male    DOB: 12-15-1950, 65 y.o.   MRN: [id number]  HPI Pt is here for regular wellness examination, and is feeling pretty well in general, and says chronic med probs are stable, except as noted below Past Medical History:  Diagnosis Date  . CAD, NATIVE VESSEL 04/19/2010   nonobstructive by cath 2007:  oLAD 20-30%, mLAD 50%, pCFX 20-30%, oAVCFX 20-30%, L renal art 50%;  normal LVF  . GERD 04/09/2007  . Headache(784.0)   . HYPERLIPIDEMIA 04/09/2007  . HYPERTENSION, BENIGN 04/19/2010  . Hypothyroidism   . Hypothyroidism    previous hyperthyroidism, s/p I-131  . LUMBAR DISC DISORDER 05/27/2010  . Nerve damage    Left leg  . OSTEOARTHRITIS, LUMBAR SPINE 04/09/2007  . RENAL CYST 05/27/2010  . Spinal headache    After having back surgery in 2014    Past Surgical History:  Procedure Laterality Date  . BACK SURGERY  2012,2014  . CARDIAC CATHETERIZATION  2007   Dr Loanne Drilling  . CHOLECYSTECTOMY    . CHOLECYSTECTOMY N/A 07/13/2013   Procedure: LAPAROSCOPIC CHOLECYSTECTOMY WITH INTRAOPERATIVE CHOLANGIOGRAM;  Surgeon: Shann Medal, MD;  [facility];  Service: General;  Laterality: N/A;  . COLONOSCOPY W/ POLYPECTOMY    . ERCP N/A 07/14/2013   Procedure: ENDOSCOPIC RETROGRADE CHOLANGIOPANCREATOGRAPHY (ERCP);  Surgeon: [name], MD;  Location[facility];  Service: Endoscopy;  Laterality: N/A;  . EYE SURGERY Bilateral    Cataract removal  . FOOT FRACTURE SURGERY    . FRACTURE SURGERY    . LUMBAR DISC SURGERY  08/06/2012   L3 & L4  . LUMBAR DISC SURGERY  11/11/2015   L1 L2    DR NITKA  . LUMBAR FUSION N/A 04/20/2015   Procedure: T12 to L1 fusion (Extension of Previous Fusion L2-S1 to T12-S1), Right Transforaminal lumbar interbody fusion, Posterior Fusion T12 to L1, with Pedicle screws, allograft, local bone graft, and  Vivigen;  Surgeon: [name], MD;  Location: [place];  Service: Orthopedics;  Laterality: N/A;  . LUMBAR  LAMINECTOMY N/A 11/10/2013   Procedure: Left L1-2 far lateral approach to excise herniated nucleus pulposus;  Surgeon: [name], MD;  Location: [place];  Service: Orthopedics;  Laterality: N/A;  . LUMBAR LAMINECTOMY/DECOMPRESSION MICRODISCECTOMY Left 08/10/2012   Procedure: Dura Repair Left Side L2-L3;  Surgeon: [name], MD;  Location: [place];  Service: Orthopedics;  Laterality: Left;  Wilson Frame, Sliding table, dura repair kit, microscope  . NECK SURGERY     X 2  . SPINE SURGERY  2006   C-spine surgery x 2    Social History   Social History  . Marital status: Single    Spouse name: N/A  . Number of children: 1  . Years of education: N/A   Occupational History  . [employer]   Social History Main Topics  . Smoking status: Former Smoker    Packs/day: 0.50    Years: 44.00    Types: Cigarettes    Quit date: 04/06/2012  . Smokeless tobacco: Never Used     Comment: pt has stopped smoking about 9 months now  . Alcohol use No  . Drug use: No  . Sexual activity: Not Currently   Other Topics Concern  . Not on file   Social History Narrative   Works in Psychologist, educational. Retired.   Lives alone, has one child in [place].     Current Outpatient Prescriptions  on File Prior to Visit  Medication Sig Dispense Refill  . levothyroxine (SYNTHROID, LEVOTHROID) 125 MCG tablet Take 1 tablet (125 mcg total) by mouth daily before breakfast. 30 tablet 11  . losartan-hydrochlorothiazide (HYZAAR) 100-12.5 MG tablet Take 1 tablet by mouth daily. 90 tablet 3  . metoprolol succinate (TOPROL-XL) 25 MG 24 hr tablet Take 1 tablet (25 mg total) by mouth daily. 90 tablet 3  . Oxycodone HCl 20 MG TABS Take 1 tablet (20 mg total) by mouth 3 (three) times daily as needed. 90 tablet 0  . pantoprazole (PROTONIX) 40 MG tablet Take 1 tablet (40 mg total) by mouth 2 (two) times daily. Before meals 60 tablet 11  . potassium chloride (K-DUR) 10 MEQ tablet Take 1 tablet (10 mEq  total) by mouth daily. 90 tablet 2   No current facility-administered medications on file prior to visit.     Allergies  Allergen Reactions  . Ceftin Other (See Comments)    Patient stated it caused "sores in mouth"  . Gabapentin     Causes upset stomach; pt unsure if Gabapentin or Unisom causes upset stomach    Family History  Problem Relation Age of Onset  . Cancer Mother     Breast Cancer, Uterine Cancer  . Breast cancer Mother   . Uterine cancer Mother   . Heart disease Mother   . Heart attack Mother   . Hypertension Brother   . Thyroid disease Neg Hx     BP 118/80   Pulse 62   Ht '5\' 11"'  (1.803 m)   Wt 195 lb (88.5 kg)   SpO2 95%   BMI 27.20 kg/m    Review of Systems Constitutional: Negative for fever.  he has gained weight.  HENT: Negative for hearing loss.   Eyes: sees opthal for for visual disturbance.  Respiratory: Negative for shortness of breath.    Cardiovascular: Negative for chest pain.  Gastrointestinal: Negative for blood in stool.   Endocrine: Negative for cold intolerance.  Genitourinary: Negative for hematuria. he has polyuria.   Musculoskeletal: Negative for gait problem.  Allergic/Immunologic: Negative for environmental allergies.  Neurological: Negative for syncope.  Hematological: Bruises/bleeds easily.  Psychiatric/Behavioral: Negative for dysphoric mood.     Objective:   Physical Exam VS: see vs page GEN: no distress HEAD: head: no deformity eyes: no periorbital swelling, no proptosis external nose and ears are normal mouth: no lesion seen NECK: a healed scar is present (C-spine procedure).  i do not appreciate a nodule in the thyroid or elsewhere in the neck CHEST WALL: no deformity LUNGS: clear to auscultation BREASTS:  No gynecomastia.  CV: reg rate and rhythm, no murmur ABD: abdomen is soft, nontender.  no hepatosplenomegaly.  not distended.  no hernia.  RECTAL: normal external and internal exam.  heme neg. PROSTATE:  Normal  size.  No nodule. MUSCULOSKELETAL: muscle bulk and strength are grossly normal.  no obvious joint swelling.  gait is normal and steady.  Old healed surgical scar at the lower back EXTEMITIES: no deformity.  no ulcer on the feet.  feet are of normal color and temp.  no edema PULSES: no carotid bruit NEURO:  cn 2-12 grossly intact.   readily moves all 4's.  sensation is intact to touch on the feet.  SKIN:  Normal texture and temperature.  No rash or suspicious lesion is visible.   NODES:  None palpable at the neck PSYCH: alert, well-oriented.  Does not appear anxious nor depressed.  Assessment & Plan:  Wellness visit today, with problems stable, except as noted. Type 2 DM: we discussed.  He agrees to take metformin Patient is advised the following: Patient Instructions  blood tests are requested for you today.  We'll let you know about the results. Please consider these measures for your health:  minimize alcohol.  Do not use tobacco products.  Have a colonoscopy at least every 10 years from age 31.   Keep firearms safely stored.  Always use seat belts.  have working smoke alarms in your home.  See an eye doctor and dentist regularly.  Never drive under the influence of alcohol or drugs (including prescription drugs).  Those with fair skin should take precautions against the sun, and should carefully examine their skin once per month, for any new or changed moles. It is critically important to prevent falling down (keep floor areas well-lit, dry, and free of loose objects.  If you have a cane, walker, or wheelchair, you should use it, even for short trips around the house.  Wear flat-soled shoes.  Also, try not to rush) I have sent a prescription to your pharmacy, for diabetes.   Please come back for a follow-up appointment in 6 months.

## 2016-06-05 ENCOUNTER — Ambulatory Visit (INDEPENDENT_AMBULATORY_CARE_PROVIDER_SITE_OTHER): Payer: PPO | Admitting: Endocrinology

## 2016-06-05 ENCOUNTER — Encounter: Payer: Self-pay | Admitting: Endocrinology

## 2016-06-05 VITALS — BP 118/80 | HR 62 | Ht 71.0 in | Wt 195.0 lb

## 2016-06-05 DIAGNOSIS — Z125 Encounter for screening for malignant neoplasm of prostate: Secondary | ICD-10-CM | POA: Diagnosis not present

## 2016-06-05 DIAGNOSIS — E119 Type 2 diabetes mellitus without complications: Secondary | ICD-10-CM

## 2016-06-05 DIAGNOSIS — Z0000 Encounter for general adult medical examination without abnormal findings: Secondary | ICD-10-CM

## 2016-06-05 DIAGNOSIS — I25119 Atherosclerotic heart disease of native coronary artery with unspecified angina pectoris: Secondary | ICD-10-CM

## 2016-06-05 DIAGNOSIS — E890 Postprocedural hypothyroidism: Secondary | ICD-10-CM

## 2016-06-05 DIAGNOSIS — I10 Essential (primary) hypertension: Secondary | ICD-10-CM

## 2016-06-05 LAB — CBC WITH DIFFERENTIAL/PLATELET
BASOS ABS: 0 10*3/uL (ref 0.0–0.1)
Basophils Relative: 0.5 % (ref 0.0–3.0)
Eosinophils Absolute: 0.2 10*3/uL (ref 0.0–0.7)
Eosinophils Relative: 2.9 % (ref 0.0–5.0)
HEMATOCRIT: 46.2 % (ref 39.0–52.0)
Hemoglobin: 15.9 g/dL (ref 13.0–17.0)
LYMPHS PCT: 19.5 % (ref 12.0–46.0)
Lymphs Abs: 1 10*3/uL (ref 0.7–4.0)
MCHC: 34.3 g/dL (ref 30.0–36.0)
MCV: 94.4 fl (ref 78.0–100.0)
MONOS PCT: 8.5 % (ref 3.0–12.0)
Monocytes Absolute: 0.5 10*3/uL (ref 0.1–1.0)
NEUTROS PCT: 68.6 % (ref 43.0–77.0)
Neutro Abs: 3.7 10*3/uL (ref 1.4–7.7)
Platelets: 188 10*3/uL (ref 150.0–400.0)
RBC: 4.9 Mil/uL (ref 4.22–5.81)
RDW: 12.3 % (ref 11.5–15.5)
WBC: 5.4 10*3/uL (ref 4.0–10.5)

## 2016-06-05 LAB — URINALYSIS, ROUTINE W REFLEX MICROSCOPIC
BILIRUBIN URINE: NEGATIVE
Ketones, ur: NEGATIVE
LEUKOCYTES UA: NEGATIVE
NITRITE: NEGATIVE
Specific Gravity, Urine: 1.025 (ref 1.000–1.030)
Total Protein, Urine: NEGATIVE
URINE GLUCOSE: NEGATIVE
Urobilinogen, UA: 0.2 (ref 0.0–1.0)
pH: 5.5 (ref 5.0–8.0)

## 2016-06-05 LAB — LIPID PANEL
CHOLESTEROL: 150 mg/dL (ref 0–200)
HDL: 41.3 mg/dL (ref >39.00)
LDL Cholesterol: 82 mg/dL (ref 0–99)
NonHDL: 109.07
Total CHOL/HDL Ratio: 4
Triglycerides: 135 mg/dL (ref 0.0–149.0)
VLDL: 27 mg/dL (ref 0.0–40.0)

## 2016-06-05 LAB — BASIC METABOLIC PANEL
BUN: 28 mg/dL — ABNORMAL HIGH (ref 6–23)
CHLORIDE: 102 meq/L (ref 96–112)
CO2: 26 meq/L (ref 19–32)
CREATININE: 1.22 mg/dL (ref 0.40–1.50)
Calcium: 9.1 mg/dL (ref 8.4–10.5)
GFR: 63.53 mL/min (ref >60.00)
GLUCOSE: 168 mg/dL — AB (ref 70–99)
POTASSIUM: 3.7 meq/L (ref 3.5–5.1)
SODIUM: 140 meq/L (ref 135–145)

## 2016-06-05 LAB — HEPATIC FUNCTION PANEL
ALBUMIN: 4.5 g/dL (ref 3.5–5.2)
ALK PHOS: 84 U/L (ref 39–117)
ALT: 33 U/L (ref 0–53)
AST: 20 U/L (ref 0–37)
BILIRUBIN DIRECT: 0.2 mg/dL (ref 0.0–0.3)
TOTAL PROTEIN: 6.9 g/dL (ref 6.0–8.3)
Total Bilirubin: 1.3 mg/dL — ABNORMAL HIGH (ref 0.2–1.2)

## 2016-06-05 LAB — HEPATITIS C ANTIBODY: HCV Ab: NEGATIVE

## 2016-06-05 LAB — POCT GLYCOSYLATED HEMOGLOBIN (HGB A1C): HEMOGLOBIN A1C: 6.7

## 2016-06-05 LAB — PSA: PSA: 1.49 ng/mL (ref 0.10–4.00)

## 2016-06-05 LAB — TSH: TSH: 2.92 u[IU]/mL (ref 0.35–4.50)

## 2016-06-05 MED ORDER — METFORMIN HCL ER 500 MG PO TB24: 500.0000 mg | ORAL_TABLET | Freq: Every day | ORAL | 3 refills | Status: DC

## 2016-06-05 NOTE — Addendum Note (Signed)
Addended by: Verlin Grills T on: 06/05/2016 10:47 AM   Modules accepted: Orders

## 2016-06-05 NOTE — Progress Notes (Signed)
we discussed code status.  pt requests full code, but would not want to be started or maintained on artificial life-support measures if there was not a reasonable chance of recovery 

## 2016-06-05 NOTE — Patient Instructions (Addendum)
blood tests are requested for you today.  We'll let you know about the results. Please consider these measures for your health:  minimize alcohol.  Do not use tobacco products.  Have a colonoscopy at least every 10 years from age 64.   Keep firearms safely stored.  Always use seat belts.  have working smoke alarms in your home.  See an eye doctor and dentist regularly.  Never drive under the influence of alcohol or drugs (including prescription drugs).  Those with fair skin should take precautions against the sun, and should carefully examine their skin once per month, for any new or changed moles. It is critically important to prevent falling down (keep floor areas well-lit, dry, and free of loose objects.  If you have a cane, walker, or wheelchair, you should use it, even for short trips around the house.  Wear flat-soled shoes.  Also, try not to rush) I have sent a prescription to your pharmacy, for diabetes.   Please come back for a follow-up appointment in 6 months.

## 2016-06-05 NOTE — Addendum Note (Signed)
Addended by: Kaylyn Lim I on: 06/05/2016 10:37 AM   Modules accepted: Orders

## 2016-06-06 LAB — HIV ANTIBODY (ROUTINE TESTING W REFLEX): HIV: NONREACTIVE

## 2016-06-14 ENCOUNTER — Telehealth (INDEPENDENT_AMBULATORY_CARE_PROVIDER_SITE_OTHER): Payer: Self-pay | Admitting: Specialist

## 2016-06-14 NOTE — Telephone Encounter (Signed)
Patient called needing Rx refilled ( Oxycodone 20mg ) The number to contact him is [phone]

## 2016-06-14 NOTE — Telephone Encounter (Signed)
Please advise on rx for oxycodone.

## 2016-06-15 ENCOUNTER — Other Ambulatory Visit (INDEPENDENT_AMBULATORY_CARE_PROVIDER_SITE_OTHER): Payer: Self-pay | Admitting: Specialist

## 2016-06-15 DIAGNOSIS — M5442 Lumbago with sciatica, left side: Principal | ICD-10-CM

## 2016-06-15 DIAGNOSIS — G8929 Other chronic pain: Secondary | ICD-10-CM

## 2016-06-15 MED ORDER — OXYCODONE HCL 20 MG PO TABS: 20.0000 mg | ORAL_TABLET | Freq: Three times a day (TID) | ORAL | 0 refills | Status: DC | PRN

## 2016-06-15 NOTE — Telephone Encounter (Signed)
Rx printed and signed for patient to pick up. Please inform patient that we are referring him to a pain management program to continue his medication while we follow his spine, this is too strong a medication to prescribe for post operative management clearly it is chronic pain, not a surgical treatment yet he does need to continue the meds, If he hurts.

## 2016-06-16 NOTE — Telephone Encounter (Signed)
Patient called and he is aware rx ready and being referred to pain management.  Thanks. Nira Conn

## 2016-06-22 DIAGNOSIS — H26492 Other secondary cataract, left eye: Secondary | ICD-10-CM | POA: Diagnosis not present

## 2016-06-22 DIAGNOSIS — H26491 Other secondary cataract, right eye: Secondary | ICD-10-CM | POA: Diagnosis not present

## 2016-06-22 DIAGNOSIS — H43812 Vitreous degeneration, left eye: Secondary | ICD-10-CM | POA: Diagnosis not present

## 2016-06-26 HISTORY — PX: COLONOSCOPY: SHX174

## 2016-06-28 ENCOUNTER — Ambulatory Visit (INDEPENDENT_AMBULATORY_CARE_PROVIDER_SITE_OTHER): Payer: PPO | Admitting: Specialist

## 2016-07-10 ENCOUNTER — Encounter: Payer: Self-pay | Admitting: Gastroenterology

## 2016-07-17 ENCOUNTER — Encounter: Payer: Self-pay | Admitting: Gastroenterology

## 2016-07-17 ENCOUNTER — Ambulatory Visit: Payer: PPO | Admitting: Physical Medicine & Rehabilitation

## 2016-07-24 ENCOUNTER — Encounter: Payer: PPO | Attending: Physical Medicine & Rehabilitation

## 2016-07-24 ENCOUNTER — Encounter: Payer: Self-pay | Admitting: Physical Medicine & Rehabilitation

## 2016-07-24 ENCOUNTER — Ambulatory Visit (HOSPITAL_BASED_OUTPATIENT_CLINIC_OR_DEPARTMENT_OTHER): Payer: PPO | Admitting: Physical Medicine & Rehabilitation

## 2016-07-24 VITALS — BP 157/95 | HR 59

## 2016-07-24 DIAGNOSIS — M259 Joint disorder, unspecified: Secondary | ICD-10-CM

## 2016-07-24 DIAGNOSIS — M5136 Other intervertebral disc degeneration, lumbar region: Secondary | ICD-10-CM

## 2016-07-24 DIAGNOSIS — M533 Sacrococcygeal disorders, not elsewhere classified: Secondary | ICD-10-CM | POA: Diagnosis not present

## 2016-07-24 DIAGNOSIS — Z9049 Acquired absence of other specified parts of digestive tract: Secondary | ICD-10-CM | POA: Insufficient documentation

## 2016-07-24 DIAGNOSIS — M5410 Radiculopathy, site unspecified: Secondary | ICD-10-CM

## 2016-07-24 DIAGNOSIS — M48062 Spinal stenosis, lumbar region with neurogenic claudication: Secondary | ICD-10-CM

## 2016-07-24 DIAGNOSIS — M545 Low back pain: Secondary | ICD-10-CM | POA: Insufficient documentation

## 2016-07-24 DIAGNOSIS — G8929 Other chronic pain: Secondary | ICD-10-CM | POA: Diagnosis not present

## 2016-07-24 DIAGNOSIS — M51369 Other intervertebral disc degeneration, lumbar region without mention of lumbar back pain or lower extremity pain: Secondary | ICD-10-CM

## 2016-07-24 DIAGNOSIS — Z79899 Other long term (current) drug therapy: Secondary | ICD-10-CM

## 2016-07-24 DIAGNOSIS — Z87891 Personal history of nicotine dependence: Secondary | ICD-10-CM | POA: Diagnosis not present

## 2016-07-24 DIAGNOSIS — M961 Postlaminectomy syndrome, not elsewhere classified: Secondary | ICD-10-CM | POA: Diagnosis not present

## 2016-07-24 DIAGNOSIS — Z5181 Encounter for therapeutic drug level monitoring: Secondary | ICD-10-CM | POA: Diagnosis not present

## 2016-07-24 DIAGNOSIS — Z9889 Other specified postprocedural states: Secondary | ICD-10-CM | POA: Diagnosis not present

## 2016-07-24 NOTE — Patient Instructions (Signed)
We'll do blocks to assess whether the radiofrequency probe procedure may be helpful for you. We'll try to get appt 1 week or at least first available

## 2016-07-24 NOTE — Addendum Note (Signed)
Addended by: Marland Mcalpine B on: 07/24/2016 02:48 PM   Modules accepted: Orders

## 2016-07-24 NOTE — Progress Notes (Signed)
Subjective:    Patient ID: Jeff Lopez, male    DOB: Nov 18, 1950, 66 y.o.   MRN: [id number]  HPI CC  Chronic low back pain  66 yo male with hx of 5 low back surgeries, 2012, 2014x2, 2015, 2016, Eventually fused T12-S1, Has followed up with orthopedics and has been receiving oxycodone 20 mg 3 times a day, previously had been on 80 mg per day.  Patient states that the oxycodone enables him to remain active, he is puzzled why his orthopedist is no longer prescribing this medication and why he has been referred to this clinic. We discussed the regulation of narcotic analgesics.  No spinal cord stimulator trials  Several rounds of PT, last in March 2017  Patient is independent with dressing and bathing. He drives. Retired since age 54.  Has had 5 injections per Dr. Ernestina Patches, including sacroiliac injection.Last injection 02/2016, 1 month relief  Walks ~1 mile per day  Other past medical history significant for coronary artery disease, gallbladder disease, hypothyroidism Pain Inventory Average Pain 0 Pain Right Now 0 My pain is intermittent, burning and aching  In the last 24 hours, has pain interfered with the following? General activity 0 Relation with others 0 Enjoyment of life 0 What TIME of day is your pain at its worst? morning, evening, and night Sleep (in general) Poor  Pain is worse with: . Pain improves with: heat/ice and medication Relief from Meds: 9  Mobility walk without assistance ability to climb steps?  yes do you drive?  yes  Function retired  Neuro/Psych No problems in this area  Prior Studies Any changes since last visit?  no  Physicians involved in your care Any changes since last visit?  no   Family History  Problem Relation Age of Onset  . Cancer Mother     Breast Cancer, Uterine Cancer  . Breast cancer Mother   . Uterine cancer Mother   . Heart disease Mother   . Heart attack Mother   . Hypertension Brother   . Thyroid disease Neg  Hx    Social History   Social History  . Marital status: Single    Spouse name: N/A  . Number of children: 1  . Years of education: N/A   Occupational History  . [employer]   Social History Main Topics  . Smoking status: Former Smoker    Packs/day: 0.50    Years: 44.00    Types: Cigarettes    Quit date: 04/06/2012  . Smokeless tobacco: Never Used     Comment: pt has stopped smoking about 9 months now  . Alcohol use No  . Drug use: No  . Sexual activity: Not Currently   Other Topics Concern  . None   Social History Narrative   Works in Psychologist, educational. Retired.   Lives alone, has one child in [place].    Past Surgical History:  Procedure Laterality Date  . BACK SURGERY  2012,2014  . CARDIAC CATHETERIZATION  2007   Dr Loanne Drilling  . CHOLECYSTECTOMY    . CHOLECYSTECTOMY N/A 07/13/2013   Procedure: LAPAROSCOPIC CHOLECYSTECTOMY WITH INTRAOPERATIVE CHOLANGIOGRAM;  Surgeon: Shann Medal, MD;  Location: [facility];  Service: General;  Laterality: N/A;  . COLONOSCOPY W/ POLYPECTOMY    . ERCP N/A 07/14/2013   Procedure: ENDOSCOPIC RETROGRADE CHOLANGIOPANCREATOGRAPHY (ERCP);  Surgeon: [name], MD;  Location: [facility];  Service: Endoscopy;  Laterality: N/A;  . EYE SURGERY Bilateral    Cataract removal  .  FOOT FRACTURE SURGERY    . FRACTURE SURGERY    . LUMBAR DISC SURGERY  08/06/2012   L3 & L4  . LUMBAR DISC SURGERY  11/11/2015   L1 L2    DR NITKA  . LUMBAR FUSION N/A 04/20/2015   Procedure: T12 to L1 fusion (Extension of Previous Fusion L2-S1 to T12-S1), Right Transforaminal lumbar interbody fusion, Posterior Fusion T12 to L1, with Pedicle screws, allograft, local bone graft, and  Vivigen;  Surgeon: [name], MD;  Location: [facility];  Service: Orthopedics;  Laterality: N/A;  . LUMBAR LAMINECTOMY N/A 11/10/2013   Procedure: Left L1-2 far lateral approach to excise herniated nucleus pulposus;  Surgeon: [name], MD;  Location: [facility];   Service: Orthopedics;  Laterality: N/A;  . LUMBAR LAMINECTOMY/DECOMPRESSION MICRODISCECTOMY Left 08/10/2012   Procedure: Dura Repair Left Side L2-L3;  Surgeon: [name], MD;  Location: [facility];  Service: Orthopedics;  Laterality: Left;  Wilson Frame, Sliding table, dura repair kit, microscope  . NECK SURGERY     X 2  . SPINE SURGERY  2006   C-spine surgery x 2   Past Medical History:  Diagnosis Date  . CAD, NATIVE VESSEL 04/19/2010   nonobstructive by cath 2007:  oLAD 20-30%, mLAD 50%, pCFX 20-30%, oAVCFX 20-30%, L renal art 50%;  normal LVF  . GERD 04/09/2007  . Headache(784.0)   . HYPERLIPIDEMIA 04/09/2007  . HYPERTENSION, BENIGN 04/19/2010  . Hypothyroidism   . Hypothyroidism    previous hyperthyroidism, s/p I-131  . LUMBAR DISC DISORDER 05/27/2010  . Nerve damage    Left leg  . OSTEOARTHRITIS, LUMBAR SPINE 04/09/2007  . RENAL CYST 05/27/2010  . Spinal headache    After having back surgery in 2014   BP (!) 157/95   Pulse (!) 59   SpO2 96%   Opioid Risk Score:   Fall Risk Score:  `1  Depression screen PHQ 2/9  Depression screen PHQ 2/9 07/24/2016  Decreased Interest 1  Down, Depressed, Hopeless 0  PHQ - 2 Score 1  Altered sleeping 2  Tired, decreased energy 1  Change in appetite 0  Feeling bad or failure about yourself  0  Trouble concentrating 0  Moving slowly or fidgety/restless 0  Suicidal thoughts 0  PHQ-9 Score 4  Difficult doing work/chores Not difficult at all   Review of Systems  Constitutional: Negative.   HENT: Negative.   Eyes: Negative.   Respiratory: Negative.   Cardiovascular: Negative.   Gastrointestinal: Negative.   Endocrine: Negative.   Genitourinary: Negative.   Musculoskeletal: Negative.   Skin: Negative.   Allergic/Immunologic: Negative.   Neurological: Negative.   Hematological: Negative.   Psychiatric/Behavioral: Negative.   All other systems reviewed and are negative.      Objective:   Physical Exam  Constitutional: He is  oriented to person, place, and time. He appears well-developed and well-nourished.  HENT:  Head: Normocephalic and atraumatic.  Eyes: Conjunctivae and EOM are normal. Pupils are equal, round, and reactive to light.  Cardiovascular: Normal rate and regular rhythm.   Murmur heard. Pulmonary/Chest: Effort normal and breath sounds normal. No respiratory distress. He has no wheezes.  Abdominal: Soft. Bowel sounds are normal. He exhibits no distension. There is no tenderness.  Neurological: He is alert and oriented to person, place, and time.  Skin: Skin is warm and dry.  Psychiatric: He has a normal mood and affect. His behavior is normal. Judgment and thought content normal.  Nursing note and vitals reviewed.  FROM BUE Neg impingement signs bilateral       Assessment & Plan:  1.  Lumbar post laminectomy syndrome s/p T12-S1 fusion Patient states that he has remained functional on narcotic analgesics at moderate doses. Reviewed imaging studies, does have extensive hardware. He has failed to progress with PT, short-term relief with sacroiliac injections As well as nonnarcotic medications including gabapentin  He does have significant pathology in his lumbar region Opioid risk is 0 Urine drug screen obtained, results pending If UDS results are consistent, would continue oxycodone 20 g 3 times a day, other alternatives given his neuropathic component of pain would be Nucynta   2. Sacroiliac disorder , left greater than right, we discussed the short-term relief with the left sacroiliac injection performed in September. Under fluoroscopic guidance. We discussed radiofrequency procedure and on first recommending S1, S2, S3 dorsal ramus injection under fluoroscopic guidance  3. Chronic radiculopathy per patient report, does not have any exam findings localizing any nerve root level, has decreased recruitment but no active denervation involving L3-S1 segmental levels

## 2016-07-29 LAB — TOXASSURE SELECT,+ANTIDEPR,UR

## 2016-07-31 ENCOUNTER — Encounter: Payer: Self-pay | Admitting: Physical Medicine & Rehabilitation

## 2016-07-31 ENCOUNTER — Ambulatory Visit (HOSPITAL_BASED_OUTPATIENT_CLINIC_OR_DEPARTMENT_OTHER): Payer: PPO | Admitting: Physical Medicine & Rehabilitation

## 2016-07-31 DIAGNOSIS — M545 Low back pain: Secondary | ICD-10-CM | POA: Insufficient documentation

## 2016-07-31 DIAGNOSIS — Z87891 Personal history of nicotine dependence: Secondary | ICD-10-CM | POA: Diagnosis not present

## 2016-07-31 DIAGNOSIS — M5442 Lumbago with sciatica, left side: Secondary | ICD-10-CM

## 2016-07-31 DIAGNOSIS — Z9889 Other specified postprocedural states: Secondary | ICD-10-CM | POA: Insufficient documentation

## 2016-07-31 DIAGNOSIS — M961 Postlaminectomy syndrome, not elsewhere classified: Secondary | ICD-10-CM | POA: Diagnosis not present

## 2016-07-31 DIAGNOSIS — G8929 Other chronic pain: Secondary | ICD-10-CM | POA: Insufficient documentation

## 2016-07-31 DIAGNOSIS — M533 Sacrococcygeal disorders, not elsewhere classified: Secondary | ICD-10-CM | POA: Insufficient documentation

## 2016-07-31 DIAGNOSIS — Z9049 Acquired absence of other specified parts of digestive tract: Secondary | ICD-10-CM | POA: Insufficient documentation

## 2016-07-31 MED ORDER — OXYCODONE HCL 20 MG PO TABS: 20.0000 mg | ORAL_TABLET | Freq: Three times a day (TID) | ORAL | 0 refills | Status: DC | PRN

## 2016-07-31 MED ORDER — DIAZEPAM 10 MG PO TABS: 10.0000 mg | ORAL_TABLET | Freq: Once | ORAL | 2 refills | Status: AC

## 2016-07-31 NOTE — Progress Notes (Signed)
HPI CC  Chronic low back pain  66 yo male with hx of 5 low back surgeries, 2012, 2014x2, 2015, 2016, Eventually fused T12-S1, Has followed up with orthopedics and has been receiving oxycodone 20 mg 3 times a day, previously had been on 80 mg per day.  Patient states that the oxycodone enables him to remain active, he is puzzled why his orthopedist is no longer prescribing this medication and why he has been referred to this clinic. We discussed the regulation of narcotic analgesics.   Patient returns today originally scheduled for S1, S2, S3 dorsal ramus injections. He states that he was not aware of this and is not mentally prepared for an injection today. Also, he does not have a driver. He states that he has tolerated injections in the past, but they're quite painful for him. He prefers IV sedation, but has had them with just local anesthetic as well.  He continues have pain in the left side of the low back into the left buttock and the posterior thigh on the left side. He had previous good but temporary relief with left sacroiliac injection. He has been maintained on a moderate dose of opioids by his orthopedist, who is no longer prescribing his pain medication. He has not tried other narcotic analgesics besides oxycodone.  Review of systems positive for low back pain, left-sided, with pain radiating to left lower extremity. No weakness in either lower extremity or upper extremity.  Examination Gen. no acute distress. Mood and affect appropriate. Extremities are without edema. Gait without evidence of toe drag or knee instability. Back has mild tenderness to palpation left L5-S1 paraspinal, as well as in the gluteus medius area. No tenderness over the greater trochanter of the hip.  Impression 1. Lumbar postlaminectomy syndrome with chronic postoperative pain. His urine toxicology from last week was reviewed and was consistent. We'll continue oxycodone immediate release 20 mg 3 times per  day. We'll sign new controlled substance agreement with our office. Continue opioid monitoring program. This consists of regular clinic visits, examinations, urine drug screen, pill counts as well as use of [place] controlled substance reporting System.  2.  sacro iliac disorder, common after lumbar fusion. He has had good temporary relief with a intra-articular injection. We discussed radiofrequency procedure and the need for dorsal ramus blocks to evaluate the effectiveness.  Will premedicate with Valium 10 mg before Procedure

## 2016-07-31 NOTE — Patient Instructions (Addendum)
Valium prior to procedure  Test blocks in 2 wk  CSA

## 2016-08-14 NOTE — Progress Notes (Signed)
Urine drug screen for this encounter is consistent for prescribed medication 

## 2016-08-15 ENCOUNTER — Encounter: Payer: Self-pay | Admitting: Physical Medicine & Rehabilitation

## 2016-08-15 ENCOUNTER — Encounter: Payer: PPO | Attending: Physical Medicine & Rehabilitation

## 2016-08-15 ENCOUNTER — Ambulatory Visit (HOSPITAL_BASED_OUTPATIENT_CLINIC_OR_DEPARTMENT_OTHER): Payer: PPO | Admitting: Physical Medicine & Rehabilitation

## 2016-08-15 VITALS — BP 155/94 | HR 70 | Resp 14

## 2016-08-15 DIAGNOSIS — M259 Joint disorder, unspecified: Secondary | ICD-10-CM | POA: Diagnosis not present

## 2016-08-15 DIAGNOSIS — G8929 Other chronic pain: Secondary | ICD-10-CM

## 2016-08-15 DIAGNOSIS — M5442 Lumbago with sciatica, left side: Secondary | ICD-10-CM

## 2016-08-15 DIAGNOSIS — M533 Sacrococcygeal disorders, not elsewhere classified: Secondary | ICD-10-CM | POA: Diagnosis not present

## 2016-08-15 DIAGNOSIS — G8928 Other chronic postprocedural pain: Secondary | ICD-10-CM | POA: Diagnosis not present

## 2016-08-15 MED ORDER — OXYCODONE HCL 20 MG PO TABS: 20.0000 mg | ORAL_TABLET | Freq: Three times a day (TID) | ORAL | 0 refills | Status: DC | PRN

## 2016-08-15 NOTE — Patient Instructions (Signed)
Patient had nerve blocks. 2. Decrease pain from the sacroiliac joint. If this is helpful, at least short-term, a radiofrequency ablation may give a longer result. Please keep tracking. Pain today and tomorrow We are looking for at least a 50% reduction of pain on the left side of  back.

## 2016-08-15 NOTE — Progress Notes (Signed)
  [facility]   Name: Jeff Lopez DOB:1950-07-28 MRN: [id number]  Date:08/15/2016  Physician: Alysia Penna, MD    Nurse/CMA: Tersea Aulds, CMA  Allergies:  Allergies  Allergen Reactions  . Ceftin Other (See Comments)    Patient stated it caused "sores in mouth"    Consent Signed: Yes.    Is patient diabetic? No.  CBG today?   Pregnant: No. LMP: No LMP for male patient. (age 65-55)  Anticoagulants: no Anti-inflammatory: no Antibiotics: no  Procedure: left medial branch block  Position: Prone Start Time: 10:22am  End Time: 10:28 am  Fluoro Time: 22  RN/CMA Kipling Graser, CMA Amelie Caracci, CMA    Time 10:10am 10:35am    BP 155/94 147/88    Pulse 70 70    Respirations 14 14    O2 Sat 94 952/10    S/S 6 6    Pain Level 4/10      D/C home with father, patient A & O X 3, D/C instructions reviewed, and sits independently.

## 2016-08-15 NOTE — Progress Notes (Signed)
Left S1, left S2 left S3 dorsal ramus injection. Indication sacroiliac pain, temporary improvement after intra-articular injection  Informed consent was obtained after discussing risks and benefits of the procedure with patient. These include bleeding, bruising, and infection. He elects to proceed and has given written consent. Patient placed prone on fluoroscopy table. Her diet prep, sterile drape 25-gauge 1.5 inch needle was used to anesthetize skin and subcutaneous tissue in 3 locations. Then a 22-gauge 3.5" needle was inserted under fluoroscopic guidance targeting the area just lateral to the sacral foramina at S1. Bone contact was made. Isovue-200 times point 5 mL demonstrated no intraforaminal spread or intravascular spread, followed by injection of point 5 mL 2% MPF lidocaine. The same procedure was repeated at S2 and S3 sacral foramina. Patient tolerated procedure well.  If patient gets 50% relief for greater. Will repeat next month.

## 2016-08-25 ENCOUNTER — Ambulatory Visit (AMBULATORY_SURGERY_CENTER): Payer: Self-pay

## 2016-08-25 ENCOUNTER — Encounter: Payer: Self-pay | Admitting: Internal Medicine

## 2016-08-25 VITALS — Ht 71.0 in | Wt 192.6 lb

## 2016-08-25 DIAGNOSIS — Z86010 Personal history of colonic polyps: Secondary | ICD-10-CM

## 2016-08-25 MED ORDER — NA SULFATE-K SULFATE-MG SULF 17.5-3.13-1.6 GM/177ML PO SOLN: 1.0000 | Freq: Once | ORAL | 0 refills | Status: AC

## 2016-08-25 NOTE — Progress Notes (Signed)
Denies allergies to eggs or soy products. Denies complication of anesthesia or sedation. Denies use of weight loss medication. Denies use of O2.   Emmi instructions given for colonoscopy.  

## 2016-08-29 DIAGNOSIS — H26491 Other secondary cataract, right eye: Secondary | ICD-10-CM | POA: Diagnosis not present

## 2016-08-29 DIAGNOSIS — H26492 Other secondary cataract, left eye: Secondary | ICD-10-CM | POA: Diagnosis not present

## 2016-09-08 ENCOUNTER — Ambulatory Visit (AMBULATORY_SURGERY_CENTER): Payer: PPO | Admitting: Gastroenterology

## 2016-09-08 ENCOUNTER — Encounter: Payer: Self-pay | Admitting: Gastroenterology

## 2016-09-08 VITALS — BP 138/87 | HR 68 | Temp 97.7°F | Resp 18 | Ht 71.0 in | Wt 192.0 lb

## 2016-09-08 DIAGNOSIS — D126 Benign neoplasm of colon, unspecified: Secondary | ICD-10-CM

## 2016-09-08 DIAGNOSIS — Z860100 Personal history of colon polyps, unspecified: Secondary | ICD-10-CM

## 2016-09-08 DIAGNOSIS — D125 Benign neoplasm of sigmoid colon: Secondary | ICD-10-CM | POA: Diagnosis not present

## 2016-09-08 DIAGNOSIS — D120 Benign neoplasm of cecum: Secondary | ICD-10-CM

## 2016-09-08 DIAGNOSIS — C182 Malignant neoplasm of ascending colon: Secondary | ICD-10-CM

## 2016-09-08 DIAGNOSIS — K635 Polyp of colon: Secondary | ICD-10-CM

## 2016-09-08 DIAGNOSIS — D124 Benign neoplasm of descending colon: Secondary | ICD-10-CM | POA: Diagnosis not present

## 2016-09-08 DIAGNOSIS — D122 Benign neoplasm of ascending colon: Secondary | ICD-10-CM | POA: Diagnosis not present

## 2016-09-08 DIAGNOSIS — Z1211 Encounter for screening for malignant neoplasm of colon: Secondary | ICD-10-CM | POA: Diagnosis not present

## 2016-09-08 MED ORDER — SODIUM CHLORIDE 0.9 % IV SOLN: 500.0000 mL | INTRAVENOUS | Status: DC

## 2016-09-08 NOTE — Patient Instructions (Signed)
YOU HAD AN ENDOSCOPIC PROCEDURE TODAY AT [facility]:   Refer to the procedure report that was given to you for any specific questions about what was found during the examination.  If the procedure report does not answer your questions, please call your gastroenterologist to clarify.  If you requested that your care partner not be given the details of your procedure findings, then the procedure report has been included in a sealed envelope for you to review at your convenience later.  YOU SHOULD EXPECT: Some feelings of bloating in the abdomen. Passage of more gas than usual.  Walking can help get rid of the air that was put into your GI tract during the procedure and reduce the bloating. If you had a lower endoscopy (such as a colonoscopy or flexible sigmoidoscopy) you may notice spotting of blood in your stool or on the toilet paper. If you underwent a bowel prep for your procedure, you may not have a normal bowel movement for a few days.  Please Note:  You might notice some irritation and congestion in your nose or some drainage.  This is from the oxygen used during your procedure.  There is no need for concern and it should clear up in a day or so.  SYMPTOMS TO REPORT IMMEDIATELY:   Following lower endoscopy (colonoscopy or flexible sigmoidoscopy):  Excessive amounts of blood in the stool  Significant tenderness or worsening of abdominal pains  Swelling of the abdomen that is new, acute  Fever of 100F or higher  For urgent or emergent issues, a gastroenterologist can be reached at any hour by calling [phone].   DIET:  We do recommend a small meal at first, but then you may proceed to your regular diet.  Drink plenty of fluids but you should avoid alcoholic beverages for 24 hours.  ACTIVITY:  You should plan to take it easy for the rest of today and you should NOT DRIVE or use heavy machinery until tomorrow (because of the sedation medicines used during the test).     FOLLOW UP: Our staff will call the number listed on your records the next business day following your procedure to check on you and address any questions or concerns that you may have regarding the information given to you following your procedure. If we do not reach you, we will leave a message.  However, if you are feeling well and you are not experiencing any problems, there is no need to return our call.  We will assume that you have returned to your regular daily activities without incident.  If any biopsies were taken you will be contacted by phone or by letter within the next 1-3 weeks.  Please call us at [phone] if you have not heard about the biopsies in 3 weeks.   SIGNATURES/CONFIDENTIALITY: You and/or your care partner have signed paperwork which will be entered into your electronic medical record.  These signatures attest to the fact that that the information above on your After Visit Summary has been reviewed and is understood.  Full responsibility of the confidentiality of this discharge information lies with you and/or your care-partner.  Please read over handouts about polyps  Continue your normal medications  Await pathology

## 2016-09-08 NOTE — Op Note (Signed)
[facility] Patient Name: Jeff Lopez Procedure Date: 09/08/2016 10:40 AM MRN: [id number] Endoscopist: [name] , MD Age: 66 Referring MD:  Date of Birth: Apr 15, 1951 Gender: Male Account #: [id number] Procedure:                Colonoscopy Indications:              High risk colon cancer surveillance: Personal                            history of colonic polyps: Adenomatous                            polyps:colonoscopy with SML in 03/2004 found 58mm                            HP polyp. Repeat colonoscopy 2013 ([facility]), done                            for new bleeding found two subCMpolyps,                            hemorrhoids. Polyps were adenomas on                            pathology.Recommended recall colonoscopy at 5 year                            interval.Recommended Medicines:                Monitored Anesthesia Care Procedure:                Pre-Anesthesia Assessment:                           - Prior to the procedure, a History and Physical                            was performed, and patient medications and                            allergies were reviewed. [name] tolerance of                            previous anesthesia was also reviewed. The risks                            and benefits of the procedure and the sedation                            options and risks were discussed with the patient.                            All questions were answered, and informed consent  was obtained. Prior Anticoagulants: The patient has                            taken no previous anticoagulant or antiplatelet                            agents. ASA Grade Assessment: II - A patient with                            mild systemic disease. After reviewing the risks                            and benefits, the patient was deemed in                            satisfactory condition to undergo the procedure.        After obtaining informed consent, the colonoscope                            was passed under direct vision. Throughout the                            procedure, the patient's blood pressure, pulse, and                            oxygen saturations were monitored continuously. The                            Colonoscope was introduced through the anus and                            advanced to the the cecum, identified by                            appendiceal orifice and ileocecal valve. The                            colonoscopy was performed without difficulty. The                            patient tolerated the procedure well. The quality                            of the bowel preparation was good. The ileocecal                            valve, appendiceal orifice, and rectum were                            photographed. Scope In: 10:45:08 AM Scope Out: 10:58:22 AM Scope Withdrawal Time: 0 hours 11 minutes 21 seconds  Total Procedure Duration: 0 hours 13 minutes 14 seconds  Findings:  Six sessile polyps were found in the sigmoid colon,                            descending colon, ascending colon and cecum. The                            polyps were 3 to 6 mm in size. These polyps were                            removed with a cold snare. Resection and retrieval                            were complete.                           The exam was otherwise without abnormality on                            direct and retroflexion views. Complications:            No immediate complications. Estimated blood loss:                            None. Estimated Blood Loss:     Estimated blood loss: none. Impression:               - Six 3 to 6 mm polyps in the sigmoid colon, in the                            descending colon, in the ascending colon and in the                            cecum, removed with a cold snare. Resected and                            retrieved.                            - The examination was otherwise normal on direct                            and retroflexion views. Recommendation:           - Patient has a contact number available for                            emergencies. The signs and symptoms of potential                            delayed complications were discussed with the                            patient. Return to normal activities tomorrow.  Written discharge instructions were provided to the                            patient.                           - Resume previous diet.                           - Continue present medications.                           You will receive a letter within 2-3 weeks with the                            pathology results and my final recommendations.                           If the polyp(s) is proven to be 'pre-cancerous' on                            pathology, you will need repeat colonoscopy in 3-5                            years. If the polyp(s) is NOT 'precancerous' on                            pathology then you should repeat colon cancer                            screening in 10 years with colonoscopy without need                            for colon cancer screening by any method prior to                            then (including stool testing). [name], MD 09/08/2016 11:01:15 AM This report has been signed electronically.

## 2016-09-08 NOTE — Progress Notes (Signed)
Pt's states no medical or surgical changes since previsit or office visit. maw 

## 2016-09-08 NOTE — Progress Notes (Signed)
A and O x3. Report to RN. Tolerated MAC anesthesia well.

## 2016-09-08 NOTE — Progress Notes (Signed)
Called to room to assist during endoscopic procedure.  Patient ID and intended procedure confirmed with present staff. Received instructions for my participation in the procedure from the performing physician.  

## 2016-09-11 ENCOUNTER — Telehealth: Payer: Self-pay

## 2016-09-11 NOTE — Telephone Encounter (Signed)
  Follow up Call-  Call back number 09/08/2016  Post procedure Call Back phone  # [phone] cell  Permission to leave phone message Yes  Some recent data might be hidden     Patient questions:  Do you have a fever, pain , or abdominal swelling? No. Pain Score  0 *  Have you tolerated food without any problems? Yes.    Have you been able to return to your normal activities? Yes.    Do you have any questions about your discharge instructions: Diet   No. Medications  No. Follow up visit  No.  Do you have questions or concerns about your Care? No.  Actions: * If pain score is 4 or above: No action needed, pain <4.

## 2016-09-14 ENCOUNTER — Encounter: Payer: Self-pay | Admitting: Gastroenterology

## 2016-09-19 ENCOUNTER — Ambulatory Visit: Payer: PPO | Admitting: Physical Medicine & Rehabilitation

## 2016-09-19 ENCOUNTER — Ambulatory Visit: Payer: PPO

## 2016-09-21 ENCOUNTER — Encounter: Payer: Self-pay | Admitting: Registered Nurse

## 2016-09-21 ENCOUNTER — Encounter: Payer: PPO | Attending: Physical Medicine & Rehabilitation | Admitting: Registered Nurse

## 2016-09-21 VITALS — BP 163/97 | HR 85 | Resp 14

## 2016-09-21 DIAGNOSIS — G8929 Other chronic pain: Secondary | ICD-10-CM | POA: Insufficient documentation

## 2016-09-21 DIAGNOSIS — Z87891 Personal history of nicotine dependence: Secondary | ICD-10-CM | POA: Insufficient documentation

## 2016-09-21 DIAGNOSIS — M961 Postlaminectomy syndrome, not elsewhere classified: Secondary | ICD-10-CM | POA: Insufficient documentation

## 2016-09-21 DIAGNOSIS — M259 Joint disorder, unspecified: Secondary | ICD-10-CM | POA: Diagnosis not present

## 2016-09-21 DIAGNOSIS — G894 Chronic pain syndrome: Secondary | ICD-10-CM | POA: Diagnosis not present

## 2016-09-21 DIAGNOSIS — Z79899 Other long term (current) drug therapy: Secondary | ICD-10-CM

## 2016-09-21 DIAGNOSIS — Z5181 Encounter for therapeutic drug level monitoring: Secondary | ICD-10-CM

## 2016-09-21 DIAGNOSIS — M5410 Radiculopathy, site unspecified: Secondary | ICD-10-CM

## 2016-09-21 DIAGNOSIS — M533 Sacrococcygeal disorders, not elsewhere classified: Secondary | ICD-10-CM | POA: Insufficient documentation

## 2016-09-21 DIAGNOSIS — M5442 Lumbago with sciatica, left side: Secondary | ICD-10-CM

## 2016-09-21 DIAGNOSIS — Z9889 Other specified postprocedural states: Secondary | ICD-10-CM | POA: Insufficient documentation

## 2016-09-21 DIAGNOSIS — M545 Low back pain: Secondary | ICD-10-CM | POA: Insufficient documentation

## 2016-09-21 DIAGNOSIS — Z9049 Acquired absence of other specified parts of digestive tract: Secondary | ICD-10-CM | POA: Insufficient documentation

## 2016-09-21 MED ORDER — OXYCODONE HCL 20 MG PO TABS: 20.0000 mg | ORAL_TABLET | Freq: Three times a day (TID) | ORAL | 0 refills | Status: DC | PRN

## 2016-09-21 NOTE — Progress Notes (Signed)
Subjective:    Patient ID: Jeff Lopez, male    DOB: 17-Jul-1950, 66 y.o.   MRN: [id number]  HPI: Jeff Lopez is a 66 year old male who returns for follow up appointment for chronic pain and medication refill. He states his pain is located in his lower back mainly left side and left lower extremity. His current exercise regime is walking.  S/P Left MBB with relief noted.   Pain Inventory Average Pain 5 Pain Right Now 4 My pain is burning, dull, tingling and aching  In the last 24 hours, has pain interfered with the following? General activity 2 Relation with others 2 Enjoyment of life 2 What TIME of day is your pain at its worst? morning Sleep (in general) Fair  Pain is worse with: bending, sitting, standing and some activites Pain improves with: medication Relief from Meds: 8  Mobility walk without assistance how many minutes can you walk? 60 ability to climb steps?  yes do you drive?  yes  Function retired  Neuro/Psych numbness tingling spasms  Prior Studies Any changes since last visit?  no  Physicians involved in your care Any changes since last visit?  no   Family History  Problem Relation Age of Onset  . Cancer Mother     Breast Cancer, Uterine Cancer  . Breast cancer Mother   . Uterine cancer Mother   . Heart disease Mother   . Heart attack Mother   . Prostate cancer Father   . Hypertension Brother   . Stomach cancer Paternal Grandmother   . Thyroid disease Neg Hx   . Colon cancer Neg Hx   . Esophageal cancer Neg Hx   . Rectal cancer Neg Hx   . Pancreatic cancer Neg Hx    Social History   Social History  . Marital status: Single    Spouse name: N/A  . Number of children: 1  . Years of education: N/A   Occupational History  . [employer]   Social History Main Topics  . Smoking status: Former Smoker    Packs/day: 0.50    Years: 44.00    Types: Cigarettes    Quit date: 04/07/2007  . Smokeless  tobacco: Never Used     Comment: pt has stopped smoking about 9 months now  . Alcohol use No  . Drug use: No  . Sexual activity: Not Currently   Other Topics Concern  . None   Social History Narrative   Works in Psychologist, educational. Retired.   Lives alone, has one child in [place].    Past Surgical History:  Procedure Laterality Date  . BACK SURGERY  2012,2014  . CARDIAC CATHETERIZATION  2007   Dr Loanne Drilling  . CHOLECYSTECTOMY    . CHOLECYSTECTOMY N/A 07/13/2013   Procedure: LAPAROSCOPIC CHOLECYSTECTOMY WITH INTRAOPERATIVE CHOLANGIOGRAM;  Surgeon: Shann Medal, MD;  [facility];  Service: General;  Laterality: N/A;  . COLONOSCOPY    . COLONOSCOPY W/ POLYPECTOMY    . ERCP N/A 07/14/2013   Procedure: ENDOSCOPIC RETROGRADE CHOLANGIOPANCREATOGRAPHY (ERCP);  Surgeon: [name], MD;  Location: [facility];  Service: Endoscopy;  Laterality: N/A;  . EYE SURGERY Bilateral    Cataract removal  . FOOT FRACTURE SURGERY    . FRACTURE SURGERY    . LUMBAR DISC SURGERY  08/06/2012   L3 & L4  . LUMBAR DISC SURGERY  11/11/2015   L1 L2    DR NITKA  . LUMBAR FUSION N/A 04/20/2015  Procedure: T12 to L1 fusion (Extension of Previous Fusion L2-S1 to T12-S1), Right Transforaminal lumbar interbody fusion, Posterior Fusion T12 to L1, with Pedicle screws, allograft, local bone graft, and  Vivigen;  Surgeon: [name], MD;  Location: [facility];  Service: Orthopedics;  Laterality: N/A;  . LUMBAR LAMINECTOMY N/A 11/10/2013   Procedure: Left L1-2 far lateral approach to excise herniated nucleus pulposus;  Surgeon: [name], MD;  Location: [facility];  Service: Orthopedics;  Laterality: N/A;  . LUMBAR LAMINECTOMY/DECOMPRESSION MICRODISCECTOMY Left 08/10/2012   Procedure: Dura Repair Left Side L2-L3;  Surgeon: [name], MD;  Location: [facility] [place];  Service: Orthopedics;  Laterality: Left;  Wilson Frame, Sliding table, dura repair kit, microscope  . NECK SURGERY     X 2  . SPINE SURGERY  2006   C-spine  surgery x 2  . UPPER GASTROINTESTINAL ENDOSCOPY     Past Medical History:  Diagnosis Date  . CAD, NATIVE VESSEL 04/19/2010   nonobstructive by cath 2007:  oLAD 20-30%, mLAD 50%, pCFX 20-30%, oAVCFX 20-30%, L renal art 50%;  normal LVF  . Cataract    bil cataracts removed  . COPD (chronic obstructive pulmonary disease) ([place])   . Diabetes mellitus without complication ([place])   . GERD 04/09/2007  . Headache(784.0)   . HYPERLIPIDEMIA 04/09/2007   Patient denies.  [name] HYPERTENSION, BENIGN 04/19/2010  . Hypothyroidism   . Hypothyroidism    previous hyperthyroidism, s/p I-131  . LUMBAR DISC DISORDER 05/27/2010  . Nerve damage    Left leg  . OSTEOARTHRITIS, LUMBAR SPINE 04/09/2007  . RENAL CYST 05/27/2010  . Spinal headache    After having back surgery in 2014   BP (!) 163/97 (BP Location: Right Arm, Patient Position: Sitting, Cuff Size: Normal)   Pulse 85   Resp 14   SpO2 95%   Opioid Risk Score:   Fall Risk Score:  `1  Depression screen PHQ 2/9  Depression screen PHQ 2/9 07/24/2016  Decreased Interest 1  Down, Depressed, Hopeless 0  PHQ - 2 Score 1  Altered sleeping 2  Tired, decreased energy 1  Change in appetite 0  Feeling bad or failure about yourself  0  Trouble concentrating 0  Moving slowly or fidgety/restless 0  Suicidal thoughts 0  PHQ-9 Score 4  Difficult doing work/chores Not difficult at all    Review of Systems  Constitutional: Negative.   HENT: Negative.   Eyes: Negative.   Respiratory: Negative.   Cardiovascular: Negative.   Gastrointestinal: Negative.   Endocrine: Negative.   Genitourinary: Negative.   Musculoskeletal:       Spasms  Skin: Negative.   Allergic/Immunologic: Negative.   Neurological: Positive for numbness.       Tingling  Hematological: Negative.   Psychiatric/Behavioral: Negative.   All other systems reviewed and are negative.      Objective:   Physical Exam  Constitutional: He is oriented to person, place, and time. He  appears well-developed and well-nourished.  HENT:  Head: Normocephalic and atraumatic.  Neck: Normal range of motion. Neck supple.  Cardiovascular: Normal rate and regular rhythm.   Pulmonary/Chest: Effort normal and breath sounds normal.  Musculoskeletal:  Normal Muscle Bulk and Muscle Testing Reveals: Upper Extremities: Full ROM and Muscle Strength 5/5 Lumbar Paraspinal Tenderness: L-3-L-5 Lower Extremities: Full ROM and Muscle Strength 5/5 Left Lower Extremity Flexion Produces Pain into left lower extremity Arises from Table with ease Narrow Based Gait  Neurological: He is alert and oriented to person,  place, and time.  Skin: Skin is warm and dry.  Psychiatric: He has a normal mood and affect.  Nursing note and vitals reviewed.         Assessment & Plan:  1.  Lumbar post laminectomy syndrome s/p T12-S1 fusion: Continue current HEP. Continue to Monitor Refilled: Oxycodone 20 mg one tablet three times daily as needed #90. 2. Sacroiliac disorder , left greater than right: S/P Left MBB with relief noted. Scheduled for Left MBB on 10/17/2016 with Dr. Letta Pate.  3. Chronic radiculopathy per patient report: Continue to Monitor.   15 minutes of face to face patient care time was spent during this visit. All questions were encouraged and answered.  F/U in 1 month

## 2016-09-25 ENCOUNTER — Telehealth: Payer: Self-pay | Admitting: Registered Nurse

## 2016-09-25 NOTE — Telephone Encounter (Signed)
On 09/25/2016 the [place] was reviewed no conflict was seen on the [place] with multiple prescribers. Jeff Lopez has a signed narcotic contract with our office. If there were any discrepancies this would have been reported to his physician.

## 2016-09-28 ENCOUNTER — Encounter: Payer: Self-pay | Admitting: Cardiovascular Disease

## 2016-09-30 ENCOUNTER — Other Ambulatory Visit: Payer: Self-pay | Admitting: Gastroenterology

## 2016-10-16 ENCOUNTER — Ambulatory Visit: Payer: PPO | Admitting: Cardiovascular Disease

## 2016-10-17 ENCOUNTER — Ambulatory Visit: Payer: PPO | Admitting: Physical Medicine & Rehabilitation

## 2016-10-19 ENCOUNTER — Encounter: Payer: PPO | Attending: Physical Medicine & Rehabilitation

## 2016-10-19 ENCOUNTER — Ambulatory Visit (HOSPITAL_BASED_OUTPATIENT_CLINIC_OR_DEPARTMENT_OTHER): Payer: PPO | Admitting: Physical Medicine & Rehabilitation

## 2016-10-19 VITALS — BP 173/138 | HR 84

## 2016-10-19 DIAGNOSIS — M533 Sacrococcygeal disorders, not elsewhere classified: Secondary | ICD-10-CM

## 2016-10-19 DIAGNOSIS — G894 Chronic pain syndrome: Secondary | ICD-10-CM | POA: Diagnosis not present

## 2016-10-19 DIAGNOSIS — G8929 Other chronic pain: Secondary | ICD-10-CM | POA: Diagnosis not present

## 2016-10-19 DIAGNOSIS — Z87891 Personal history of nicotine dependence: Secondary | ICD-10-CM | POA: Diagnosis not present

## 2016-10-19 DIAGNOSIS — Z5181 Encounter for therapeutic drug level monitoring: Secondary | ICD-10-CM

## 2016-10-19 DIAGNOSIS — Z79899 Other long term (current) drug therapy: Secondary | ICD-10-CM

## 2016-10-19 DIAGNOSIS — M961 Postlaminectomy syndrome, not elsewhere classified: Secondary | ICD-10-CM | POA: Insufficient documentation

## 2016-10-19 DIAGNOSIS — M545 Low back pain: Secondary | ICD-10-CM | POA: Diagnosis not present

## 2016-10-19 DIAGNOSIS — Z9049 Acquired absence of other specified parts of digestive tract: Secondary | ICD-10-CM | POA: Insufficient documentation

## 2016-10-19 DIAGNOSIS — Z9889 Other specified postprocedural states: Secondary | ICD-10-CM | POA: Insufficient documentation

## 2016-10-19 DIAGNOSIS — M5442 Lumbago with sciatica, left side: Secondary | ICD-10-CM

## 2016-10-19 MED ORDER — OXYCODONE HCL 20 MG PO TABS: 20.0000 mg | ORAL_TABLET | Freq: Three times a day (TID) | ORAL | 0 refills | Status: DC | PRN

## 2016-10-19 NOTE — Patient Instructions (Addendum)
Repeat Left S1,2,3 dorsal ramus

## 2016-10-19 NOTE — Progress Notes (Signed)
Subjective:    Patient ID: Jeff Lopez, male    DOB: May 24, 1951, 66 y.o.   MRN: [id number] T12-S1 fusion 5 surgeries Dr Louanne Skye HPI S1,2,3 Dorsal ramus injection helpful on temporary basis Scheduled for repeat Some Left thigh pain as well  Left thigh Pain is described as a funny feeling under the skin. Sometimes numbness. No weakness in the left thigh. No bowel or bladder dysfunction. Pain Inventory Average Pain 5 Pain Right Now 6 My pain is .  In the last 24 hours, has pain interfered with the following? General activity 4 Relation with others 0 Enjoyment of life 3 What TIME of day is your pain at its worst? morning Sleep (in general) Fair  Pain is worse with: standing and some activites Pain improves with: heat/ice and medication Relief from Meds: 7  Mobility walk without assistance ability to climb steps?  yes do you drive?  yes  Function retired  Neuro/Psych No problems in this area  Prior Studies Any changes since last visit?  no  Physicians involved in your care Any changes since last visit?  no   Family History  Problem Relation Age of Onset  . Cancer Mother     Breast Cancer, Uterine Cancer  . Breast cancer Mother   . Uterine cancer Mother   . Heart disease Mother   . Heart attack Mother   . Prostate cancer Father   . Hypertension Brother   . Stomach cancer Paternal Grandmother   . Thyroid disease Neg Hx   . Colon cancer Neg Hx   . Esophageal cancer Neg Hx   . Rectal cancer Neg Hx   . Pancreatic cancer Neg Hx    Social History   Social History  . Marital status: Single    Spouse name: N/A  . Number of children: 1  . Years of education: N/A   Occupational History  . [employer]   Social History Main Topics  . Smoking status: Former Smoker    Packs/day: 0.50    Years: 44.00    Types: Cigarettes    Quit date: 04/07/2007  . Smokeless tobacco: Never Used     Comment: pt has stopped smoking about 9 months  now  . Alcohol use No  . Drug use: No  . Sexual activity: Not Currently   Other Topics Concern  . Not on file   Social History Narrative   Works in Psychologist, educational. Retired.   Lives alone, has one child in [place].    Past Surgical History:  Procedure Laterality Date  . BACK SURGERY  2012,2014  . CARDIAC CATHETERIZATION  2007   Dr Loanne Drilling  . CHOLECYSTECTOMY    . CHOLECYSTECTOMY N/A 07/13/2013   Procedure: LAPAROSCOPIC CHOLECYSTECTOMY WITH INTRAOPERATIVE CHOLANGIOGRAM;  Surgeon: Shann Medal, MD;  [facility];  Service: General;  Laterality: N/A;  . COLONOSCOPY    . COLONOSCOPY W/ POLYPECTOMY    . ERCP N/A 07/14/2013   Procedure: ENDOSCOPIC RETROGRADE CHOLANGIOPANCREATOGRAPHY (ERCP);  Surgeon: [name], MD;  Location: [facility];  Service: Endoscopy;  Laterality: N/A;  . EYE SURGERY Bilateral    Cataract removal  . FOOT FRACTURE SURGERY    . FRACTURE SURGERY    . LUMBAR DISC SURGERY  08/06/2012   L3 & L4  . LUMBAR DISC SURGERY  11/11/2015   L1 L2    DR NITKA  . LUMBAR FUSION N/A 04/20/2015   Procedure: T12 to L1 fusion (Extension of Previous Fusion L2-S1 to  T12-S1), Right Transforaminal lumbar interbody fusion, Posterior Fusion T12 to L1, with Pedicle screws, allograft, local bone graft, and  Vivigen;  Surgeon: [name], MD;  Location: [facility];  Service: Orthopedics;  Laterality: N/A;  . LUMBAR LAMINECTOMY N/A 11/10/2013   Procedure: Left L1-2 far lateral approach to excise herniated nucleus pulposus;  Surgeon: [name], MD;  Location: [facility];  Service: Orthopedics;  Laterality: N/A;  . LUMBAR LAMINECTOMY/DECOMPRESSION MICRODISCECTOMY Left 08/10/2012   Procedure: Dura Repair Left Side L2-L3;  Surgeon: [name], MD;  Location: [facility];  Service: Orthopedics;  Laterality: Left;  Wilson Frame, Sliding table, dura repair kit, microscope  . NECK SURGERY     X 2  . SPINE SURGERY  2006   C-spine surgery x 2  . UPPER GASTROINTESTINAL ENDOSCOPY     Past Medical  History:  Diagnosis Date  . CAD, NATIVE VESSEL 04/19/2010   nonobstructive by cath 2007:  oLAD 20-30%, mLAD 50%, pCFX 20-30%, oAVCFX 20-30%, L renal art 50%;  normal LVF  . Cataract    bil cataracts removed  . COPD (chronic obstructive pulmonary disease) ([place])   . Diabetes mellitus without complication ([place])   . GERD 04/09/2007  . Headache(784.0)   . HYPERLIPIDEMIA 04/09/2007   Patient denies.  [name] HYPERTENSION, BENIGN 04/19/2010  . Hypothyroidism   . Hypothyroidism    previous hyperthyroidism, s/p I-131  . LUMBAR DISC DISORDER 05/27/2010  . Nerve damage    Left leg  . OSTEOARTHRITIS, LUMBAR SPINE 04/09/2007  . RENAL CYST 05/27/2010  . Spinal headache    After having back surgery in 2014   BP (!) 173/138   Pulse 84   SpO2 95%   Opioid Risk Score:   Fall Risk Score:  `1  Depression screen PHQ 2/9  Depression screen PHQ 2/9 07/24/2016  Decreased Interest 1  Down, Depressed, Hopeless 0  PHQ - 2 Score 1  Altered sleeping 2  Tired, decreased energy 1  Change in appetite 0  Feeling bad or failure about yourself  0  Trouble concentrating 0  Moving slowly or fidgety/restless 0  Suicidal thoughts 0  PHQ-9 Score 4  Difficult doing work/chores Not difficult at all    Review of Systems  Constitutional: Negative.   HENT: Negative.   Eyes: Negative.   Respiratory: Negative.   Cardiovascular: Negative.   Gastrointestinal: Negative.   Endocrine: Negative.   Genitourinary: Negative.   Musculoskeletal: Negative.   Skin: Negative.   Allergic/Immunologic: Negative.   Neurological: Negative.   Hematological: Negative.   Psychiatric/Behavioral: Negative.   All other systems reviewed and are negative.      Objective:   Physical Exam  Constitutional: He is oriented to person, place, and time. He appears well-developed and well-nourished.  HENT:  Head: Normocephalic and atraumatic.  Eyes: Conjunctivae and EOM are normal. Pupils are equal, round, and reactive to light.    Neck: Normal range of motion.  Musculoskeletal:  Tenderness over the left PSIS  Neurological: He is alert and oriented to person, place, and time. He has normal strength. No sensory deficit. Coordination and gait normal.  Motor strength is 5/5 bilateral hip flexor, knee extensor, ankle dorsiflexor  Negative straight leg raising test  Psychiatric: He has a normal mood and affect. His behavior is normal.  Nursing note and vitals reviewed.         Assessment & Plan:  1. Thoracic and lumbar postlaminectomy syndrome, status post T12-S1 fusion per Dr. Louanne Skye.  He has chronic  axial pain. Continue oxycodone 20 mg 3 times a day  Continue opioid monitoring program. This consists of regular clinic visits, examinations, urine drug screen, pill counts as well as use of [place] controlled substance reporting System.  2. Left sacroiliac disorder. We'll repeat S1, S2, S3 dorsal ramus injections under fluoroscopic guidance. If this is helpful on a temporary basis by relieving at least 50% of pain in the left low back, buttock region, would proceed to radiofrequency neurotomy

## 2016-10-25 ENCOUNTER — Telehealth: Payer: Self-pay | Admitting: *Deleted

## 2016-10-25 ENCOUNTER — Other Ambulatory Visit: Payer: Self-pay

## 2016-10-25 LAB — TOXASSURE SELECT,+ANTIDEPR,UR

## 2016-10-25 NOTE — Telephone Encounter (Signed)
Urine drug screen for this encounter is inconsistent for prescribed medication. He is prescribed oxycodone and it was absent of parent drug and metabolites.  By [facility] it was last filled 09/25/16 # 72. His UDS was done on 10/19/16 (day 25 of RX) so he should have had #17 pills left.  No pill count was recorded on that day and no mention of last dose taken..  The only valium he has been prescribed was one pill for pre injection dispensed on 07/31/16 w/2 refills.  He was not having an injection on the day of the UDS. But he had been scheduled for one 10/17/16 and it was cancelled due to C Arm was broken, so it is possible he took one thinking he was having injection so that may explain the oxazepam and temazepam metabolites in urine.

## 2016-10-26 NOTE — Telephone Encounter (Signed)
Discharge due to non compliance 

## 2016-10-26 NOTE — Telephone Encounter (Signed)
Attempted to reach Jeff Lopez.  He did not answer and recording said no voicemail to leave message.  Discharge letter will be mailed

## 2016-10-27 NOTE — Telephone Encounter (Signed)
Contact made with Jeff Lopez. Notified him of his discharge.

## 2016-11-08 ENCOUNTER — Other Ambulatory Visit: Payer: Self-pay | Admitting: Endocrinology

## 2016-11-13 ENCOUNTER — Ambulatory Visit: Payer: PPO | Admitting: Physical Medicine & Rehabilitation

## 2016-11-16 ENCOUNTER — Ambulatory Visit (INDEPENDENT_AMBULATORY_CARE_PROVIDER_SITE_OTHER): Payer: PPO | Admitting: Endocrinology

## 2016-11-16 ENCOUNTER — Encounter: Payer: Self-pay | Admitting: Endocrinology

## 2016-11-16 VITALS — BP 132/86 | HR 79 | Temp 98.7°F | Ht 71.0 in | Wt 183.0 lb

## 2016-11-16 DIAGNOSIS — E119 Type 2 diabetes mellitus without complications: Secondary | ICD-10-CM

## 2016-11-16 LAB — BASIC METABOLIC PANEL
BUN: 19 mg/dL (ref 6–23)
CO2: 23 meq/L (ref 19–32)
CREATININE: 1.29 mg/dL (ref 0.40–1.50)
Calcium: 9 mg/dL (ref 8.4–10.5)
Chloride: 106 mEq/L (ref 96–112)
GFR: 59.48 mL/min — ABNORMAL LOW (ref >60.00)
GLUCOSE: 203 mg/dL — AB (ref 70–99)
POTASSIUM: 3 meq/L — AB (ref 3.5–5.1)
Sodium: 140 mEq/L (ref 135–145)

## 2016-11-16 LAB — POCT GLYCOSYLATED HEMOGLOBIN (HGB A1C): Hemoglobin A1C: 6.4

## 2016-11-16 MED ORDER — AZITHROMYCIN 500 MG PO TABS: 500.0000 mg | ORAL_TABLET | Freq: Every day | ORAL | 0 refills | Status: DC

## 2016-11-16 NOTE — Patient Instructions (Addendum)
blood tests are requested for you today.  We'll let you know about the results. Based on the results, you may be able to stop the potassium.   I have sent a prescription to your pharmacy. Loratadine-d (non-prescription), and flonase will help your congestion, also.   I hope you feel better soon.  If you don't feel better by next week, please call back, so I can refer you to a specialist.  Please continue the same medication for diabetes.  Your next annual physical is due 06/05/17.

## 2016-11-16 NOTE — Progress Notes (Signed)
Subjective:    Patient ID: Jeff Lopez, male    DOB: 01-02-1951, 66 y.o.   MRN: [id number]  HPI  Pt returns for f/u of diabetes mellitus: DM type: 2 Dx'ed: 5643 Complications: mild renal insuff Therapy: metformin DKA: never Severe hypoglycemia: never Pancreatitis: never Other: he has never been on insulin Interval history: Pt states few days of moderate congestion in the nose, and assoc rhinorrhea.   He has stopped oxycodone.   Past Medical History:  Diagnosis Date  . CAD, NATIVE VESSEL 04/19/2010   nonobstructive by cath 2007:  oLAD 20-30%, mLAD 50%, pCFX 20-30%, oAVCFX 20-30%, L renal art 50%;  normal LVF  . Cataract    bil cataracts removed  . COPD (chronic obstructive pulmonary disease) ([place])   . Diabetes mellitus without complication ([place])   . GERD 04/09/2007  . Headache(784.0)   . HYPERLIPIDEMIA 04/09/2007   Patient denies.  [name] HYPERTENSION, BENIGN 04/19/2010  . Hypothyroidism   . Hypothyroidism    previous hyperthyroidism, s/p I-131  . LUMBAR DISC DISORDER 05/27/2010  . Nerve damage    Left leg  . OSTEOARTHRITIS, LUMBAR SPINE 04/09/2007  . RENAL CYST 05/27/2010  . Spinal headache    After having back surgery in 2014    Past Surgical History:  Procedure Laterality Date  . BACK SURGERY  2012,2014  . CARDIAC CATHETERIZATION  2007   Dr Loanne Drilling  . CHOLECYSTECTOMY    . CHOLECYSTECTOMY N/A 07/13/2013   Procedure: LAPAROSCOPIC CHOLECYSTECTOMY WITH INTRAOPERATIVE CHOLANGIOGRAM;  Surgeon: Shann Medal, MD;  [facility];  Service: General;  Laterality: N/A;  . COLONOSCOPY    . COLONOSCOPY W/ POLYPECTOMY    . ERCP N/A 07/14/2013   Procedure: ENDOSCOPIC RETROGRADE CHOLANGIOPANCREATOGRAPHY (ERCP);  Surgeon: [name], MD;  Location: [facility];  Service: Endoscopy;  Laterality: N/A;  . EYE SURGERY Bilateral    Cataract removal  . FOOT FRACTURE SURGERY    . FRACTURE SURGERY    . LUMBAR DISC SURGERY  08/06/2012   L3 & L4  . LUMBAR DISC SURGERY   11/11/2015   L1 L2    DR NITKA  . LUMBAR FUSION N/A 04/20/2015   Procedure: T12 to L1 fusion (Extension of Previous Fusion L2-S1 to T12-S1), Right Transforaminal lumbar interbody fusion, Posterior Fusion T12 to L1, with Pedicle screws, allograft, local bone graft, and  Vivigen;  Surgeon: [name], MD;  Location: [facility];  Service: Orthopedics;  Laterality: N/A;  . LUMBAR LAMINECTOMY N/A 11/10/2013   Procedure: Left L1-2 far lateral approach to excise herniated nucleus pulposus;  Surgeon: [name], MD;  Location: [facility];  Service: Orthopedics;  Laterality: N/A;  . LUMBAR LAMINECTOMY/DECOMPRESSION MICRODISCECTOMY Left 08/10/2012   Procedure: Dura Repair Left Side L2-L3;  Surgeon: [name], MD;  Location: [facility];  Service: Orthopedics;  Laterality: Left;  Wilson Frame, Sliding table, dura repair kit, microscope  . NECK SURGERY     X 2  . SPINE SURGERY  2006   C-spine surgery x 2  . UPPER GASTROINTESTINAL ENDOSCOPY      Social History   Social History  . Marital status: Single    Spouse name: N/A  . Number of children: 1  . Years of education: N/A   Occupational History  . [employer]   Social History Main Topics  . Smoking status: Former Smoker    Packs/day: 0.50    Years: 44.00    Types: Cigarettes    Quit date:  04/07/2007  . Smokeless tobacco: Never Used     Comment: pt has stopped smoking about 9 months now  . Alcohol use No  . Drug use: No  . Sexual activity: Not Currently   Other Topics Concern  . Not on file   Social History Narrative   Works in Psychologist, educational. Retired.   Lives alone, has one child in [place].     Current Outpatient Prescriptions on File Prior to Visit  Medication Sig Dispense Refill  . levothyroxine (SYNTHROID, LEVOTHROID) 125 MCG tablet take 1 tablet by mouth once daily before BREAKFAST. 90 tablet 0  . losartan-hydrochlorothiazide (HYZAAR) 100-12.5 MG tablet Take 1 tablet by mouth daily. 90 tablet 3  .  metFORMIN (GLUCOPHAGE-XR) 500 MG 24 hr tablet Take 1 tablet (500 mg total) by mouth daily with breakfast. 90 tablet 3  . metoprolol succinate (TOPROL-XL) 25 MG 24 hr tablet Take 1 tablet (25 mg total) by mouth daily. 90 tablet 3  . Multiple Vitamin (MULTIVITAMIN) tablet Take 1 tablet by mouth daily.    . pantoprazole (PROTONIX) 40 MG tablet take 1 tablet by mouth twice a day before MEALS. 60 tablet 11  . potassium chloride (K-DUR) 10 MEQ tablet Take 1 tablet (10 mEq total) by mouth daily. 90 tablet 2   No current facility-administered medications on file prior to visit.     Allergies  Allergen Reactions  . Ceftin Other (See Comments)    Patient stated it caused "sores in mouth" Thrush    Family History  Problem Relation Age of Onset  . Cancer Mother        Breast Cancer, Uterine Cancer  . Breast cancer Mother   . Uterine cancer Mother   . Heart disease Mother   . Heart attack Mother   . Prostate cancer Father   . Hypertension Brother   . Stomach cancer Paternal Grandmother   . Thyroid disease Neg Hx   . Colon cancer Neg Hx   . Esophageal cancer Neg Hx   . Rectal cancer Neg Hx   . Pancreatic cancer Neg Hx     BP 132/86   Pulse 79   Temp 98.7 F (37.1 C) (Oral)   Ht _0  (1.803 m)   Wt 183 lb (83 kg)   SpO2 95%   BMI 25.52 kg/m    Review of Systems Denies fever and earache.     Objective:   Physical Exam VITAL SIGNS:  See vs page GENERAL: no distress Pulses: dorsalis pedis intact bilat.   MSK: no deformity of the feet CV: no leg edema Skin:  no ulcer on the feet.  normal color and temp on the feet. Neuro: sensation is intact to touch on the feet Both eac's and tm's are normal LUNGS:  Clear to auscultation Pulses: dorsalis pedis intact bilat.   MSK: no deformity of the feet CV: no leg edema Skin:  no ulcer on the feet.  normal color and temp on the feet. Neuro: sensation is intact to touch on the feet   Lab Results  Component Value Date   HGBA1C 6.4  11/16/2016      Assessment & Plan:  Allergic rhinitis, mild exacerbation Type 2 DM, with renal insuff, well-controlled Hypokalemia: he wants to d/c [facility] if possible  Patient Instructions  blood tests are requested for you today.  We'll let you know about the results. Based on the results, you may be able to stop the potassium.   I have sent a prescription to  your pharmacy. Loratadine-d (non-prescription), and flonase will help your congestion, also.   I hope you feel better soon.  If you don't feel better by next week, please call back, so I can refer you to a specialist.  Please continue the same medication for diabetes.  Your next annual physical is due 06/05/17.

## 2016-11-24 ENCOUNTER — Telehealth: Payer: Self-pay | Admitting: Endocrinology

## 2016-11-24 DIAGNOSIS — J309 Allergic rhinitis, unspecified: Secondary | ICD-10-CM

## 2016-11-24 NOTE — Telephone Encounter (Signed)
I sent referral.

## 2016-11-24 NOTE — Telephone Encounter (Signed)
Pt came in and said that he has finished his antibiotics and also taken the Claritin and Flonase and he said that he is still having sinus problems.  He said that Dr. Loanne Drilling had mentioned that he would refer him to an ENT if things had not gotten better, so he is requesting this be done. Please advise.

## 2016-11-27 NOTE — Telephone Encounter (Signed)
Noted  

## 2016-11-29 DIAGNOSIS — J0140 Acute pansinusitis, unspecified: Secondary | ICD-10-CM | POA: Diagnosis not present

## 2016-12-18 ENCOUNTER — Ambulatory Visit: Payer: PPO | Admitting: Endocrinology

## 2017-01-01 ENCOUNTER — Telehealth: Payer: Self-pay | Admitting: Endocrinology

## 2017-01-01 NOTE — Telephone Encounter (Signed)
Pt conderned breathing etc other symptoms he wants to discuss and possible change meds. First available appt is 02/09/17 pt needs seen before that if possible. Call pt [phone].

## 2017-01-01 NOTE — Telephone Encounter (Signed)
Patient notified we can we see him 01/04/2017 at 800 am. Patient voiced understanding and had no further questions.

## 2017-01-04 ENCOUNTER — Encounter: Payer: Self-pay | Admitting: Endocrinology

## 2017-01-04 ENCOUNTER — Ambulatory Visit (INDEPENDENT_AMBULATORY_CARE_PROVIDER_SITE_OTHER): Payer: PPO | Admitting: Endocrinology

## 2017-01-04 VITALS — BP 134/84 | HR 77 | Ht 71.0 in | Wt 184.0 lb

## 2017-01-04 DIAGNOSIS — E119 Type 2 diabetes mellitus without complications: Secondary | ICD-10-CM | POA: Diagnosis not present

## 2017-01-04 DIAGNOSIS — R61 Generalized hyperhidrosis: Secondary | ICD-10-CM | POA: Diagnosis not present

## 2017-01-04 DIAGNOSIS — E876 Hypokalemia: Secondary | ICD-10-CM | POA: Diagnosis not present

## 2017-01-04 LAB — CBC WITH DIFFERENTIAL/PLATELET
BASOS ABS: 0 10*3/uL (ref 0.0–0.1)
Basophils Relative: 0.9 % (ref 0.0–3.0)
EOS ABS: 0.2 10*3/uL (ref 0.0–0.7)
Eosinophils Relative: 3.9 % (ref 0.0–5.0)
HCT: 46.9 % (ref 39.0–52.0)
Hemoglobin: 16.4 g/dL (ref 13.0–17.0)
LYMPHS ABS: 1.4 10*3/uL (ref 0.7–4.0)
Lymphocytes Relative: 25.2 % (ref 12.0–46.0)
MCHC: 34.9 g/dL (ref 30.0–36.0)
MCV: 96.8 fl (ref 78.0–100.0)
MONO ABS: 0.4 10*3/uL (ref 0.1–1.0)
Monocytes Relative: 8.2 % (ref 3.0–12.0)
NEUTROS PCT: 61.8 % (ref 43.0–77.0)
Neutro Abs: 3.4 10*3/uL (ref 1.4–7.7)
Platelets: 180 10*3/uL (ref 150.0–400.0)
RBC: 4.84 Mil/uL (ref 4.22–5.81)
RDW: 12.7 % (ref 11.5–15.5)
WBC: 5.5 10*3/uL (ref 4.0–10.5)

## 2017-01-04 LAB — BASIC METABOLIC PANEL
BUN: 13 mg/dL (ref 6–23)
CHLORIDE: 106 meq/L (ref 96–112)
CO2: 26 meq/L (ref 19–32)
Calcium: 9.1 mg/dL (ref 8.4–10.5)
Creatinine, Ser: 1.2 mg/dL (ref 0.40–1.50)
GFR: 64.63 mL/min (ref >60.00)
Glucose, Bld: 152 mg/dL — ABNORMAL HIGH (ref 70–99)
POTASSIUM: 3.1 meq/L — AB (ref 3.5–5.1)
SODIUM: 142 meq/L (ref 135–145)

## 2017-01-04 LAB — TSH: TSH: 1.3 u[IU]/mL (ref 0.35–4.50)

## 2017-01-04 LAB — VITAMIN D 25 HYDROXY (VIT D DEFICIENCY, FRACTURES): VITD: 29.81 ng/mL — AB (ref 30.00–100.00)

## 2017-01-04 MED ORDER — AMOXICILLIN 250 MG PO CAPS: 250.0000 mg | ORAL_CAPSULE | Freq: Three times a day (TID) | ORAL | 0 refills | Status: DC

## 2017-01-04 NOTE — Patient Instructions (Addendum)
I have sent a prescription to your pharmacy, for an antibiotic pill. blood and urine tests are requested for you today.  We'll let you know about the results.  If these tests are normal, try going off the metformin for a week or so, to see if this helps.

## 2017-01-04 NOTE — Progress Notes (Signed)
Subjective:    Patient ID: Jeff Lopez, male    DOB: 1951/05/16, 66 y.o.   MRN: [id number]  HPI Pt states few says of pain at both ears, and assoc sore throat.  He also has slight prod cough.   Past Medical History:  Diagnosis Date  . CAD, NATIVE VESSEL 04/19/2010   nonobstructive by cath 2007:  oLAD 20-30%, mLAD 50%, pCFX 20-30%, oAVCFX 20-30%, L renal art 50%;  normal LVF  . Cataract    bil cataracts removed  . COPD (chronic obstructive pulmonary disease) ([place])   . Diabetes mellitus without complication ([place])   . GERD 04/09/2007  . Headache(784.0)   . HYPERLIPIDEMIA 04/09/2007   Patient denies.  [name] HYPERTENSION, BENIGN 04/19/2010  . Hypothyroidism   . Hypothyroidism    previous hyperthyroidism, s/p I-131  . LUMBAR DISC DISORDER 05/27/2010  . Nerve damage    Left leg  . OSTEOARTHRITIS, LUMBAR SPINE 04/09/2007  . RENAL CYST 05/27/2010  . Spinal headache    After having back surgery in 2014    Past Surgical History:  Procedure Laterality Date  . BACK SURGERY  2012,2014  . CARDIAC CATHETERIZATION  2007   Dr Loanne Drilling  . CHOLECYSTECTOMY    . CHOLECYSTECTOMY N/A 07/13/2013   Procedure: LAPAROSCOPIC CHOLECYSTECTOMY WITH INTRAOPERATIVE CHOLANGIOGRAM;  Surgeon: Shann Medal, MD;  Location: [facility];  Service: General;  Laterality: N/A;  . COLONOSCOPY    . COLONOSCOPY W/ POLYPECTOMY    . ERCP N/A 07/14/2013   Procedure: ENDOSCOPIC RETROGRADE CHOLANGIOPANCREATOGRAPHY (ERCP);  Surgeon: [name], MD;  Location: [facility];  Service: Endoscopy;  Laterality: N/A;  . EYE SURGERY Bilateral    Cataract removal  . FOOT FRACTURE SURGERY    . FRACTURE SURGERY    . LUMBAR DISC SURGERY  08/06/2012   L3 & L4  . LUMBAR DISC SURGERY  11/11/2015   L1 L2    DR NITKA  . LUMBAR FUSION N/A 04/20/2015   Procedure: T12 to L1 fusion (Extension of Previous Fusion L2-S1 to T12-S1), Right Transforaminal lumbar interbody fusion, Posterior Fusion T12 to L1, with Pedicle screws, allograft,  local bone graft, and  Vivigen;  Surgeon: [name], MD;  Location: [facility];  Service: Orthopedics;  Laterality: N/A;  . LUMBAR LAMINECTOMY N/A 11/10/2013   Procedure: Left L1-2 far lateral approach to excise herniated nucleus pulposus;  Surgeon: [name], MD;  Location: [facility];  Service: Orthopedics;  Laterality: N/A;  . LUMBAR LAMINECTOMY/DECOMPRESSION MICRODISCECTOMY Left 08/10/2012   Procedure: Dura Repair Left Side L2-L3;  Surgeon: [name], MD;  Location: [facility];  Service: Orthopedics;  Laterality: Left;  Wilson Frame, Sliding table, dura repair kit, microscope  . NECK SURGERY     X 2  . SPINE SURGERY  2006   C-spine surgery x 2  . UPPER GASTROINTESTINAL ENDOSCOPY      Social History   Social History  . Marital status: Single    Spouse name: N/A  . Number of children: 1  . Years of education: N/A   Occupational History  . [employer]   Social History Main Topics  . Smoking status: Former Smoker    Packs/day: 0.50    Years: 44.00    Types: Cigarettes    Quit date: 04/07/2007  . Smokeless tobacco: Never Used     Comment: pt has stopped smoking about 9 months now  . Alcohol use No  . Drug use: No  . Sexual activity:  Not Currently   Other Topics Concern  . Not on file   Social History Narrative   Works in Psychologist, educational. Retired.   Lives alone, has one child in [place].     Current Outpatient Prescriptions on File Prior to Visit  Medication Sig Dispense Refill  . levothyroxine (SYNTHROID, LEVOTHROID) 125 MCG tablet take 1 tablet by mouth once daily before BREAKFAST. 90 tablet 0  . losartan-hydrochlorothiazide (HYZAAR) 100-12.5 MG tablet Take 1 tablet by mouth daily. 90 tablet 3  . metFORMIN (GLUCOPHAGE-XR) 500 MG 24 hr tablet Take 1 tablet (500 mg total) by mouth daily with breakfast. 90 tablet 3  . metoprolol succinate (TOPROL-XL) 25 MG 24 hr tablet Take 1 tablet (25 mg total) by mouth daily. 90 tablet 3  . Multiple Vitamin  (MULTIVITAMIN) tablet Take 1 tablet by mouth daily.    . pantoprazole (PROTONIX) 40 MG tablet take 1 tablet by mouth twice a day before MEALS. 60 tablet 11  . potassium chloride (K-DUR) 10 MEQ tablet Take 1 tablet (10 mEq total) by mouth daily. 90 tablet 2   No current facility-administered medications on file prior to visit.     Allergies  Allergen Reactions  . Ceftin Other (See Comments)    Patient stated it caused "sores in mouth" Thrush    Family History  Problem Relation Age of Onset  . Cancer Mother        Breast Cancer, Uterine Cancer  . Breast cancer Mother   . Uterine cancer Mother   . Heart disease Mother   . Heart attack Mother   . Prostate cancer Father   . Hypertension Brother   . Stomach cancer Paternal Grandmother   . Thyroid disease Neg Hx   . Colon cancer Neg Hx   . Esophageal cancer Neg Hx   . Rectal cancer Neg Hx   . Pancreatic cancer Neg Hx     BP 134/84   Pulse 77   Ht '5\' 11"'  (1.803 m)   Wt 184 lb (83.5 kg)   SpO2 96%   BMI 25.66 kg/m    Review of Systems He has headache, diarrhea, hight sweats, increased appetite, muscle weakness, fatigue, heat intolerance, insomnia, chills, low-back pain, acral numbness, and nausea.  Denies fever.      Objective:   Physical Exam VITAL SIGNS:  See vs page GENERAL: no distress head: no deformity  eyes: no periorbital swelling, no proptosis  external nose and ears are normal  mouth: no lesion seen Both eac's and tm's are normal Gait: normal and steady. LUNGS:  Clear to auscultation.        Assessment & Plan:  URI: new.  Multiple sxs.  Most likely related to psychological aspect of withdrawal from opiate addiction.    Patient Instructions  I have sent a prescription to your pharmacy, for an antibiotic pill. blood and urine tests are requested for you today.  We'll let you know about the results.  If these tests are normal, try going off the metformin for a week or so, to see if this helps.

## 2017-01-05 ENCOUNTER — Telehealth: Payer: Self-pay | Admitting: Endocrinology

## 2017-01-05 DIAGNOSIS — R61 Generalized hyperhidrosis: Secondary | ICD-10-CM | POA: Diagnosis not present

## 2017-01-05 NOTE — Telephone Encounter (Signed)
Patient stated he got a call from our office, don't know if it were you are not

## 2017-01-05 NOTE — Telephone Encounter (Signed)
I contacted the patient back and advised of results from labs on 01/04/2017. Patient's questions about the labs fwd to MD in a separate message.

## 2017-01-09 DIAGNOSIS — J329 Chronic sinusitis, unspecified: Secondary | ICD-10-CM | POA: Diagnosis not present

## 2017-01-09 DIAGNOSIS — J342 Deviated nasal septum: Secondary | ICD-10-CM | POA: Diagnosis not present

## 2017-01-09 DIAGNOSIS — J324 Chronic pansinusitis: Secondary | ICD-10-CM | POA: Insufficient documentation

## 2017-01-09 DIAGNOSIS — J320 Chronic maxillary sinusitis: Secondary | ICD-10-CM | POA: Diagnosis not present

## 2017-01-09 LAB — METANEPHRINES, URINE, 24 HOUR
METANEPH TOTAL UR: 325 ug/(24.h) (ref 224–832)
Metanephrines, Ur: 66 mcg/24 h — ABNORMAL LOW (ref 90–315)
Normetanephrine, 24H Ur: 259 mcg/24 h (ref 122–676)

## 2017-01-10 LAB — CATECHOLAMINES, FRACTIONATED, URINE, 24 HOUR
CREATININE, URINE MG/DAY-CATEUR: 1.77 g/(24.h) (ref 0.63–2.50)
Calculated Total (E+NE): 40 mcg/24 h (ref 26–121)
Dopamine, 24 hr Urine: 220 mcg/24 h (ref 52–480)
NOREPINEPHRINE, 24 HR UR: 40 ug/(24.h) (ref 15–100)
TOTAL VOLUME - CF 24HR U: 1000 mL

## 2017-01-18 ENCOUNTER — Encounter: Payer: Self-pay | Admitting: Cardiovascular Disease

## 2017-01-18 ENCOUNTER — Ambulatory Visit (INDEPENDENT_AMBULATORY_CARE_PROVIDER_SITE_OTHER): Payer: PPO | Admitting: Cardiovascular Disease

## 2017-01-18 VITALS — BP 150/100 | HR 68 | Ht 71.0 in | Wt 189.1 lb

## 2017-01-18 DIAGNOSIS — I10 Essential (primary) hypertension: Secondary | ICD-10-CM

## 2017-01-18 MED ORDER — METOPROLOL SUCCINATE ER 50 MG PO TB24: 50.0000 mg | ORAL_TABLET | Freq: Every day | ORAL | 3 refills | Status: DC

## 2017-01-18 NOTE — Patient Instructions (Signed)
Medication Instructions:  Your physician has recommended you make the following change in your medication:  1. INCREASE Metoprolol Succinate to 50mg  take one tablet by mouth daily 2. After surgery please START Aspirin 81mg  take one tablet by mouth daily  Labwork: No new orders.   Testing/Procedures: No new orders.   Follow-Up: Your physician recommends that you schedule a follow-up appointment in: 1 MONTH for BP check with the Hypertension Clinic  Your physician wants you to follow-up in: 1 YEAR with Dr Burt Knack.  You will receive a reminder letter in the mail two months in advance. If you don't receive a letter, please call our office to schedule the follow-up appointment.   Any Other Special Instructions Will Be Listed Below (If Applicable).     If you need a refill on your cardiac medications before your next appointment, please call your pharmacy.

## 2017-01-18 NOTE — Progress Notes (Signed)
Cardiology Office Note Date:  01/18/2017   ID:  [name], DOB Apr 28, 1951, MRN [id number]  PCP:  Jeff Shin, MD  Cardiologist:  Jeff Mocha, MD    Chief Complaint  Patient presents with  . Follow-up   History of Present Illness: Jeff Lopez is a 66 y.o. male who presents for preoperative cardiovascular evaluation. He is scheduled for sinus surgery next week.   He's been followed for nonobstructive CAD, last seen in March 2017. The patient had cardiac catheterization in 2007 with 50% stenosis of the LAD noted. He also is followed for hypertension. His last nuclear stress test in 2016 showed no ischemia. Gated LVEF was 47%. However, an echocardiogram last year demonstrated normal LV systolic function with an LVEF of 55-60%.  The patient has been on antibiotics for several weeks for sinus problems. Surgery is pending next week. He has been waking up with night sweats mostly with his had sweating. He denies chest pain. He does have exertional dyspnea which is chronic. No orthopnea, PND, or leg swelling. He doesn't monitor his blood pressure at home. States that he has not been sleeping well and last night only slept for 2 hours.    Past Medical History:  Diagnosis Date  . CAD, NATIVE VESSEL 04/19/2010   nonobstructive by cath 2007:  oLAD 20-30%, mLAD 50%, pCFX 20-30%, oAVCFX 20-30%, L renal art 50%;  normal LVF  . Cataract    bil cataracts removed  . COPD (chronic obstructive pulmonary disease) ([place])   . Diabetes mellitus without complication ([place])   . GERD 04/09/2007  . Headache(784.0)   . HYPERLIPIDEMIA 04/09/2007   Patient denies.  [name] HYPERTENSION, BENIGN 04/19/2010  . Hypothyroidism   . Hypothyroidism    previous hyperthyroidism, s/p I-131  . LUMBAR DISC DISORDER 05/27/2010  . Nerve damage    Left leg  . OSTEOARTHRITIS, LUMBAR SPINE 04/09/2007  . RENAL CYST 05/27/2010  . Spinal headache    After having back surgery in 2014    Past Surgical History:    Procedure Laterality Date  . BACK SURGERY  2012,2014  . CARDIAC CATHETERIZATION  2007   Dr Loanne Drilling  . CHOLECYSTECTOMY    . CHOLECYSTECTOMY N/A 07/13/2013   Procedure: LAPAROSCOPIC CHOLECYSTECTOMY WITH INTRAOPERATIVE CHOLANGIOGRAM;  Surgeon: Shann Medal, MD;  [facility];  Service: General;  Laterality: N/A;  . COLONOSCOPY    . COLONOSCOPY W/ POLYPECTOMY    . ERCP N/A 07/14/2013   Procedure: ENDOSCOPIC RETROGRADE CHOLANGIOPANCREATOGRAPHY (ERCP);  Surgeon: [name], MD;  Location: [facility];  Service: Endoscopy;  Laterality: N/A;  . EYE SURGERY Bilateral    Cataract removal  . FOOT FRACTURE SURGERY    . FRACTURE SURGERY    . LUMBAR DISC SURGERY  08/06/2012   L3 & L4  . LUMBAR DISC SURGERY  11/11/2015   L1 L2    DR NITKA  . LUMBAR FUSION N/A 04/20/2015   Procedure: T12 to L1 fusion (Extension of Previous Fusion L2-S1 to T12-S1), Right Transforaminal lumbar interbody fusion, Posterior Fusion T12 to L1, with Pedicle screws, allograft, local bone graft, and  Vivigen;  Surgeon: [name], MD;  Location: [facility];  Service: Orthopedics;  Laterality: N/A;  . LUMBAR LAMINECTOMY N/A 11/10/2013   Procedure: Left L1-2 far lateral approach to excise herniated nucleus pulposus;  Surgeon: [name], MD;  Location: [facility];  Service: Orthopedics;  Laterality: N/A;  . LUMBAR LAMINECTOMY/DECOMPRESSION MICRODISCECTOMY Left 08/10/2012   Procedure: Dura Repair Left Side  L2-L3;  Surgeon: James E Nitka, MD;  Location[facility];  Service: Orthopedics;  Laterality: Left;  Wilson Frame, Sliding table, dura repair kit, microscope  . NECK SURGERY     X 2  . SPINE SURGERY  2006   C-spine surgery x 2  . UPPER GASTROINTESTINAL ENDOSCOPY      Current Outpatient Prescriptions  Medication Sig Dispense Refill  . levothyroxine (SYNTHROID, LEVOTHROID) 125 MCG tablet take 1 tablet by mouth once daily before BREAKFAST. 90 tablet 0  . losartan-hydrochlorothiazide (HYZAAR) 100-12.5 MG tablet Take 1 tablet by  mouth daily. 90 tablet 3  . metoprolol succinate (TOPROL-XL) 50 MG 24 hr tablet Take 1 tablet (50 mg total) by mouth daily. 90 tablet 3  . Multiple Vitamin (MULTIVITAMIN) tablet Take 1 tablet by mouth daily.    . pantoprazole (PROTONIX) 40 MG tablet take 1 tablet by mouth twice a day before MEALS. 60 tablet 11   No current facility-administered medications for this visit.     Allergies:   Ceftin and Cefuroxime axetil   Social History:  The patient  reports that he quit smoking about 9 years ago. His smoking use included Cigarettes. He has a 22.00 pack-year smoking history. He has never used smokeless tobacco. He reports that he does not drink alcohol or use drugs.   Family History:  [name]  family history includes Breast cancer in his mother; Cancer in his mother; Heart attack in his mother; Heart disease in his mother; Hypertension in his brother; Prostate cancer in his father; Stomach cancer in his paternal grandmother; Uterine cancer in his mother.    ROS:  Please see the history of present illness.  Otherwise, review of systems is positive for diarrhea, back pain, muscle pain, excessive sweating, excessive fatigue, headaches.  All other systems are reviewed and negative.    PHYSICAL EXAM: VS:  BP (!) 150/100   Pulse 68   Ht 5' 11" (1.803 m)   Wt 189 lb 1.9 oz (85.8 kg)   BMI 26.38 kg/m  , BMI Body mass index is 26.38 kg/m. GEN: Well nourished, well developed, in no acute distress  HEENT: normal  Neck: no JVD, no masses. No carotid bruits Cardiac: RRR without murmur or gallop                Respiratory:  clear to auscultation bilaterally, normal work of breathing GI: soft, nontender, nondistended, + BS MS: no deformity or atrophy  Ext: no pretibial edema, pedal pulses 2+= bilaterally Skin: warm and dry, no rash Neuro:  Strength and sensation are intact Psych: euthymic mood, full affect  EKG:  EKG is ordered today. The ekg ordered today shows NSR, within normal limits,  HR 69 bpm  Recent Labs: 06/05/2016: ALT 33 01/04/2017: BUN 13; Creatinine, Ser 1.20; Hemoglobin 16.4; Platelets 180.0; Potassium 3.1; Sodium 142; TSH 1.30   Lipid Panel     Component Value Date/Time   CHOL 150 06/05/2016 1037   TRIG 135.0 06/05/2016 1037   HDL 41.30 06/05/2016 1037   CHOLHDL 4 06/05/2016 1037   VLDL 27.0 06/05/2016 1037   LDLCALC 82 06/05/2016 1037   LDLDIRECT 115.6 11/28/2012 0947      Wt Readings from Last 3 Encounters:  01/18/17 189 lb 1.9 oz (85.8 kg)  01/04/17 184 lb (83.5 kg)  11/16/16 183 lb (83 kg)     Cardiac Studies Reviewed: Echo 09/20/2015: Study Conclusions  - Left ventricle: The cavity size was normal. Wall thickness was   normal. Systolic   function was normal. The estimated ejection   fraction was in the range of 55% to 60%. Wall motion was normal;   there were no regional wall motion abnormalities. Doppler   parameters are consistent with abnormal left ventricular   relaxation (grade 1 diastolic dysfunction). - Aortic valve: There was mild regurgitation. - Mitral valve: Calcified annulus. - Left atrium: The atrium was mildly dilated.  Impressions:  - Normal LV systolic function; grade 1 diastolic dysfunction; mild   AI; mild LAE.  Nuclear stress test 12/21/2014: Study Highlights    Nuclear stress EF: 47%.  There was no ST segment deviation noted during stress.  The study is normal.  This is a low risk study.  The left ventricular ejection fraction is mildly decreased (45-54%).   No scar or reversible ischemia seen. There is apical thinning more notable on the resting study. There is mild LV systolic dysfunction without segmental wall motion abnormalities.   ASSESSMENT AND PLAN: 1.  Coronary artery disease, native vessel, without angina: The patient should continue on his current medical program. He should start aspirin 81 mg daily after he has surgery.  2. Hypertension, uncontrolled. Blood pressure on my recheck is 150/100  again. Recommended increase the Toprol succinate 50 mg daily. Continue losartan/HCTZ once daily. Follow-up in the Pharm-D clinic for a blood pressure check and medication titration in one month.  3. Preoperative cardiovascular evaluation: Twelve-lead EKG is normal. No exertional angina. Adjustment of blood pressure medication as outlined above. I think the patient is okay to proceed with surgery at low risk of cardiac complication.  Current medicines are reviewed with the patient today.  The patient does not have concerns regarding medicines.  Labs/ tests ordered today include:   Orders Placed This Encounter  Procedures  . EKG 12-Lead    Disposition:   FU one year  Signed, , , MD  01/18/2017 6:09 PM    [facility] [address], [place], [place]  [address] Phone: [phone]; Fax: [phone]  

## 2017-01-23 ENCOUNTER — Other Ambulatory Visit: Payer: Self-pay

## 2017-01-23 DIAGNOSIS — J324 Chronic pansinusitis: Secondary | ICD-10-CM | POA: Diagnosis not present

## 2017-01-23 DIAGNOSIS — J320 Chronic maxillary sinusitis: Secondary | ICD-10-CM | POA: Diagnosis not present

## 2017-01-23 DIAGNOSIS — J322 Chronic ethmoidal sinusitis: Secondary | ICD-10-CM | POA: Diagnosis not present

## 2017-01-23 DIAGNOSIS — J342 Deviated nasal septum: Secondary | ICD-10-CM | POA: Diagnosis not present

## 2017-01-23 DIAGNOSIS — J321 Chronic frontal sinusitis: Secondary | ICD-10-CM | POA: Diagnosis not present

## 2017-02-07 ENCOUNTER — Other Ambulatory Visit: Payer: Self-pay | Admitting: Gastroenterology

## 2017-02-09 ENCOUNTER — Ambulatory Visit: Payer: PPO | Admitting: Endocrinology

## 2017-02-15 ENCOUNTER — Other Ambulatory Visit: Payer: Self-pay

## 2017-02-15 ENCOUNTER — Telehealth: Payer: Self-pay | Admitting: Endocrinology

## 2017-02-15 ENCOUNTER — Other Ambulatory Visit: Payer: Self-pay | Admitting: Gastroenterology

## 2017-02-15 DIAGNOSIS — L219 Seborrheic dermatitis, unspecified: Secondary | ICD-10-CM | POA: Diagnosis not present

## 2017-02-15 MED ORDER — LEVOTHYROXINE SODIUM 125 MCG PO TABS: ORAL_TABLET | ORAL | 0 refills | Status: DC

## 2017-02-15 NOTE — Telephone Encounter (Signed)
Submitted

## 2017-02-15 NOTE — Telephone Encounter (Signed)
MEDICATION:  levothyroxine (SYNTHROID, LEVOTHROID) 125 MCG tablet  PHARMACY:  [facility] [place], [place] - [address] AT [address] [phone] (Phone) [phone] (Fax)   IS THIS A 90 DAY SUPPLY : yes  IS PATIENT OUT OF MEDICATION: yes  IF NOT; HOW MUCH IS LEFT: n/a  LAST APPOINTMENT DATE:01/04/17  NEXT APPOINTMENT DATE:06/06/17  OTHER COMMENTS:    **Let patient know to contact pharmacy at the end of the day to make sure medication is ready. **  ** Please notify patient to allow 48-72 hours to process**  **Encourage patient to contact the pharmacy for refills or they can request refills through [facility]**

## 2017-02-19 ENCOUNTER — Ambulatory Visit: Payer: PPO

## 2017-02-19 NOTE — Progress Notes (Signed)
Patient ID: Jeff Lopez                 DOB: [birth year]                      MRN: [id number]     HPI: Jeff Lopez is a 66 y.o. male referred by Dr. Burt Knack to HTN clinic. PMH is significant for nonobstructive CAD, cardiac catheterization in 2007, hypertension, COPD, diabetes, and hyperlipidemia. Pt was seen by Dr. Burt Knack last month for preoperative cardiovascular evaluation prior to sinus surgery. BP was found to be elevated, and metoprolol succinate was increased to 50 mg daily and losartan-HCTZ was continued.  Pt presents to clinic in good spirits. Denies dizziness, lightheadedness, or falls. States that he doesn't sleep well at night and feels like he's gotten more fatigued at home since the dose increase of metoprolol succinate. Pt is also concerned about his low potassium level. He is currently taking a potassium supplement and eating foods rich in potassium. He does not check BP at home.   Current HTN meds: metoprolol succinate 50 mg daily, losartan-HCTZ 100 mg-12.5 mg daily  BP goal: <130/80 mmHg   Family History: [name]  family history includes Breast cancer in his mother; Cancer in his mother; Heart attack in his mother; Heart disease in his mother; Hypertension in his brother; Prostate cancer in his father; Stomach cancer in his paternal grandmother; Uterine cancer in his mother.   Social History: The patient  reports that he quit smoking about 9 years ago. His smoking use included Cigarettes. He has a 22.00 pack-year smoking history. He has never used smokeless tobacco. He reports that he does not drink alcohol or use drugs.   Diet: Tries to eat a lot of vegetables. Not a lot of sweets, trying to cut carbs. Eats out more than eating at home. 2-3 glasses regular soda daily. No coffee, drinks tea occasionally when he is out in [employer].   Exercise: Likes to walk in the park, will start back after he recovers from recent sinus surgery.   Home BP readings: Does not  check BP at home  Wt Readings from Last 3 Encounters:  01/18/17 189 lb 1.9 oz (85.8 kg)  01/04/17 184 lb (83.5 kg)  11/16/16 183 lb (83 kg)   BP Readings from Last 3 Encounters:  01/18/17 (!) 150/100  01/04/17 134/84  11/16/16 132/86   Pulse Readings from Last 3 Encounters:  01/18/17 68  01/04/17 77  11/16/16 79    Renal function: CrCl cannot be calculated (Patient's most recent lab result is older than the maximum 21 days allowed.).  Past Medical History:  Diagnosis Date  . CAD, NATIVE VESSEL 04/19/2010   nonobstructive by cath 2007:  oLAD 20-30%, mLAD 50%, pCFX 20-30%, oAVCFX 20-30%, L renal art 50%;  normal LVF  . Cataract    bil cataracts removed  . COPD (chronic obstructive pulmonary disease) ([place])   . Diabetes mellitus without complication ([place])   . GERD 04/09/2007  . Headache(784.0)   . HYPERLIPIDEMIA 04/09/2007   Patient denies.  [name] HYPERTENSION, BENIGN 04/19/2010  . Hypothyroidism   . Hypothyroidism    previous hyperthyroidism, s/p I-131  . LUMBAR DISC DISORDER 05/27/2010  . Nerve damage    Left leg  . OSTEOARTHRITIS, LUMBAR SPINE 04/09/2007  . RENAL CYST 05/27/2010  . Spinal headache    After having back surgery in 2014    Current Outpatient Prescriptions on File Prior to Visit  Medication Sig Dispense Refill  . levothyroxine (SYNTHROID, LEVOTHROID) 125 MCG tablet take 1 tablet by mouth once daily before BREAKFAST. 90 tablet 0  . losartan-hydrochlorothiazide (HYZAAR) 100-12.5 MG tablet Take 1 tablet by mouth daily. 90 tablet 3  . metoprolol succinate (TOPROL-XL) 50 MG 24 hr tablet Take 1 tablet (50 mg total) by mouth daily. 90 tablet 3  . Multiple Vitamin (MULTIVITAMIN) tablet Take 1 tablet by mouth daily.    . pantoprazole (PROTONIX) 40 MG tablet take 1 tablet by mouth twice a day before MEALS. 60 tablet 11   No current facility-administered medications on file prior to visit.     Allergies  Allergen Reactions  . Ceftin Other (See Comments)     Patient stated it caused "sores in mouth" Thrush  . Cefuroxime Axetil Other (See Comments)    Sores in mouth     Assessment/Plan:  1. Hypertension: Pt's BP is above his goal of <130/80 mmHg. Due to hypokalemia concerns and pt preference, will stop losartan-hydrochlorothiazide 100-12.5 mg. Will start irbesartan 300 mg daily for more potent BP lowering effects than losartan. In addition, will start spironolactone 25 mg daily for BP control and to help increase K. Continue metoprolol succinate 50 mg daily. Discussed lifestyle modifications to help lower BP. Encouraged pt to check BP at home and keep a written log of the readings. Plan to f/u in 1 week for BMET and BP check.  -Barkley Boards, PharmD Student  Sherryll Skoczylas E. Shawntia Mangal, PharmD, CPP, [facility] [address] [place], [place] [address] Phone: [phone]; Fax: [phone] 02/20/2017 4:31 PM

## 2017-02-20 ENCOUNTER — Ambulatory Visit (INDEPENDENT_AMBULATORY_CARE_PROVIDER_SITE_OTHER): Payer: PPO | Admitting: Pharmacist

## 2017-02-20 VITALS — BP 152/94 | HR 70

## 2017-02-20 DIAGNOSIS — I10 Essential (primary) hypertension: Secondary | ICD-10-CM | POA: Diagnosis not present

## 2017-02-20 MED ORDER — IRBESARTAN 300 MG PO TABS: 300.0000 mg | ORAL_TABLET | Freq: Every day | ORAL | 11 refills | Status: DC

## 2017-02-20 MED ORDER — SPIRONOLACTONE 25 MG PO TABS: 25.0000 mg | ORAL_TABLET | Freq: Every day | ORAL | 11 refills | Status: DC

## 2017-02-20 NOTE — Patient Instructions (Addendum)
It was good seeing you today.  Your blood pressure is elevated today.  STOP taking losartan-hydrochlorothiazide 100-12.5 mg.  To prevent decreasing your potassium and to lower your blood pressure more effectively, START irbesartan 300 mg 1 tablet by mouth once daily and spironolactone 25 mg 1 tablet by mouth once daily.  Continue metoprolol succinate 50 mg daily.  If you experience increased dizziness or fatigue/weakness, please contact the clinic at [phone].  Try to check your blood pressure at home and keep a log of the readings.   Follow up for lab work and blood pressure evaluation in 1 week on 03/01/17.

## 2017-03-01 ENCOUNTER — Encounter (INDEPENDENT_AMBULATORY_CARE_PROVIDER_SITE_OTHER): Payer: Self-pay

## 2017-03-01 ENCOUNTER — Other Ambulatory Visit: Payer: PPO | Admitting: *Deleted

## 2017-03-01 ENCOUNTER — Ambulatory Visit (INDEPENDENT_AMBULATORY_CARE_PROVIDER_SITE_OTHER): Payer: PPO | Admitting: Pharmacist

## 2017-03-01 VITALS — BP 144/100 | HR 65

## 2017-03-01 DIAGNOSIS — I10 Essential (primary) hypertension: Secondary | ICD-10-CM

## 2017-03-01 NOTE — Progress Notes (Signed)
Patient ID: Jeff Lopez                 DOB: Nov 23, 1950                      MRN: [id number]     HPI: Jeff Lopez is a 66 y.o. male referred by Dr. Burt Knack to HTN clinic. PMH is significant for nonobstructive CAD, cardiac catheterization in 2007, hypertension, COPD, diabetes, and hyperlipidemia.  At last visit BP was elevated at 152/78mmHg, and HR of 70 bpm. Losartan-HCTZ 100-12.5mg  was discontinued due to hypokalemia concerns and replaced with irbesartan 300mg  daily and spironolactone 25mg  daily.   Pt presents in good spirits. States that he does not sleep well at night and has tried taking Unisom and melatonin without benefit. Pt is concerned about recent diabetes diagnosis by PCP and is interested in controlling his blood sugar by diet and exercise. Pt is holding off on taking potassium supplement until BMET has resulted from today. He has not checked his BP at home since his last visit. He denies dizziness, blurred vision, headaches, or falls. He took all of his BP medications this morning.  Current HTN meds: metoprolol succinate 50 mg daily, irbesartan 300mg  daily, spironolactone 25mg  daily.  BP goal: <130/80 mmHg  Family History: [name] family history includes Breast cancer in his mother; Cancer in his mother; Heart attack in his mother; Heart disease in his mother; Hypertension in his brother; Prostate cancer in his father; Stomach cancer in his paternal grandmother; Uterine cancer in his mother.  Social History: The patient reports that he quit smoking about 9 years ago. His smoking use included Cigarettes. He has a 22.00 pack-year smoking history. He has never used smokeless tobacco. He reports that he does not drink alcohol or use drugs.  Diet: Eats out more than eating at home. States he drinks a lot of Pepsi and Gatorade, and drinks sweet tea when eating out. Trying to eat a low carb diet, but states he's not sure what to eat due to everything containing carbs.    Exercise: Likes to walk in the park, will start back after he recovers from recent sinus surgery.   Home BP readings: Does not check BP at home  Wt Readings from Last 3 Encounters:  01/18/17 189 lb 1.9 oz (85.8 kg)  01/04/17 184 lb (83.5 kg)  11/16/16 183 lb (83 kg)   BP Readings from Last 3 Encounters:  02/20/17 (!) 152/94  01/18/17 (!) 150/100  01/04/17 134/84   Pulse Readings from Last 3 Encounters:  02/20/17 70  01/18/17 68  01/04/17 77    Renal function: CrCl cannot be calculated (Patient's most recent lab result is older than the maximum 21 days allowed.).  Past Medical History:  Diagnosis Date  . CAD, NATIVE VESSEL 04/19/2010   nonobstructive by cath 2007:  oLAD 20-30%, mLAD 50%, pCFX 20-30%, oAVCFX 20-30%, L renal art 50%;  normal LVF  . Cataract    bil cataracts removed  . COPD (chronic obstructive pulmonary disease) ([place])   . Diabetes mellitus without complication ([place])   . GERD 04/09/2007  . Headache(784.0)   . HYPERLIPIDEMIA 04/09/2007   Patient denies.  [name] HYPERTENSION, BENIGN 04/19/2010  . Hypothyroidism   . Hypothyroidism    previous hyperthyroidism, s/p I-131  . LUMBAR DISC DISORDER 05/27/2010  . Nerve damage    Left leg  . OSTEOARTHRITIS, LUMBAR SPINE 04/09/2007  . RENAL CYST 05/27/2010  . Spinal headache  After having back surgery in 2014    Current Outpatient Prescriptions on File Prior to Visit  Medication Sig Dispense Refill  . irbesartan (AVAPRO) 300 MG tablet Take 1 tablet (300 mg total) by mouth daily. 30 tablet 11  . levothyroxine (SYNTHROID, LEVOTHROID) 125 MCG tablet take 1 tablet by mouth once daily before BREAKFAST. 90 tablet 0  . metFORMIN (GLUCOPHAGE) 500 MG tablet Take 500 mg by mouth daily.    . metoprolol succinate (TOPROL-XL) 50 MG 24 hr tablet Take 1 tablet (50 mg total) by mouth daily. 90 tablet 3  . Multiple Vitamin (MULTIVITAMIN) tablet Take 1 tablet by mouth daily.    . pantoprazole (PROTONIX) 40 MG tablet take 1 tablet  by mouth twice a day before MEALS. 60 tablet 11  . spironolactone (ALDACTONE) 25 MG tablet Take 1 tablet (25 mg total) by mouth daily. 30 tablet 11   No current facility-administered medications on file prior to visit.     Allergies  Allergen Reactions  . Ceftin Other (See Comments)    Patient stated it caused "sores in mouth" Thrush  . Cefuroxime Axetil Other (See Comments)    Sores in mouth     Assessment/Plan:  1. Hypertension: Pt's BP is above his goal of <130/80 mmHg. Will monitor BMET results tomorrow and if stable will increase spironolactone to 50mg  daily. Continue metoprolol 50mg  daily and irbesartan 300mg  daily. Discussed diet changes and provided handout on how to improve food choices. Plan to f/u in 2 weeks for BP check if BMET is stable and spironolactone dose increased.   Patient seen with: Maple Mirza, PharmD student  Harlon Flor. Kervin Bones, PharmD, CPP, [facility] [address] [place], [place] [address] Phone: [phone]; Fax: [phone] 03/01/2017 4:28 PM

## 2017-03-01 NOTE — Patient Instructions (Signed)
It was nice to see you today! Your blood pressure is looking better, but it is still higher than we would like it to be.  Our goal for you is <130/80.    We will check lab work today, if everything is good we will increase the spironolactone dose to 50mg  daily. You can take 2 of the 25mg  tablets until you run out, we will send in a prescription to [facility] for the 50mg  tablets.   Try to watch your intake of sweet drinks (sodas/tea).   Thank you for coming in today!

## 2017-03-02 ENCOUNTER — Telehealth: Payer: Self-pay | Admitting: Pharmacist

## 2017-03-02 LAB — BASIC METABOLIC PANEL
BUN / CREAT RATIO: 19 (ref 10–24)
BUN: 20 mg/dL (ref 8–27)
CO2: 21 mmol/L (ref 20–29)
CREATININE: 1.04 mg/dL (ref 0.76–1.27)
Calcium: 9.2 mg/dL (ref 8.6–10.2)
Chloride: 104 mmol/L (ref 96–106)
GFR calc Af Amer: 87 mL/min/{1.73_m2} (ref >59)
GFR, EST NON AFRICAN AMERICAN: 76 mL/min/{1.73_m2} (ref >59)
Glucose: 110 mg/dL — ABNORMAL HIGH (ref 65–99)
Potassium: 4.4 mmol/L (ref 3.5–5.2)
SODIUM: 145 mmol/L — AB (ref 134–144)

## 2017-03-02 MED ORDER — SPIRONOLACTONE 50 MG PO TABS: 50.0000 mg | ORAL_TABLET | Freq: Every day | ORAL | 11 refills | Status: DC

## 2017-03-02 NOTE — Telephone Encounter (Addendum)
BMET stable and K improved and now in range since stopping losartan-HCTZ and potassium supplement and starting irbesartan and spironolactone. Since BP remained elevated at HTN visit yesterday, will increase spironolactone from 25mg  to 50mg  daily and f/u with pt in 10 days for BMET and BP check. Pt is aware of lab results and medication changes.

## 2017-03-14 ENCOUNTER — Ambulatory Visit: Payer: PPO

## 2017-03-16 DIAGNOSIS — J324 Chronic pansinusitis: Secondary | ICD-10-CM | POA: Diagnosis not present

## 2017-03-23 DIAGNOSIS — J324 Chronic pansinusitis: Secondary | ICD-10-CM | POA: Diagnosis not present

## 2017-03-29 DIAGNOSIS — Z23 Encounter for immunization: Secondary | ICD-10-CM | POA: Diagnosis not present

## 2017-03-29 DIAGNOSIS — L219 Seborrheic dermatitis, unspecified: Secondary | ICD-10-CM | POA: Diagnosis not present

## 2017-04-05 ENCOUNTER — Ambulatory Visit (INDEPENDENT_AMBULATORY_CARE_PROVIDER_SITE_OTHER): Payer: PPO | Admitting: Endocrinology

## 2017-04-05 ENCOUNTER — Encounter: Payer: Self-pay | Admitting: Endocrinology

## 2017-04-05 VITALS — BP 132/90 | HR 77 | Temp 98.6°F | Resp 16 | Wt 196.1 lb

## 2017-04-05 DIAGNOSIS — H6690 Otitis media, unspecified, unspecified ear: Secondary | ICD-10-CM | POA: Diagnosis not present

## 2017-04-05 DIAGNOSIS — E1159 Type 2 diabetes mellitus with other circulatory complications: Secondary | ICD-10-CM

## 2017-04-05 DIAGNOSIS — E119 Type 2 diabetes mellitus without complications: Secondary | ICD-10-CM

## 2017-04-05 LAB — POCT GLYCOSYLATED HEMOGLOBIN (HGB A1C): Hemoglobin A1C: 6.7

## 2017-04-05 MED ORDER — DOXYCYCLINE HYCLATE 100 MG PO TABS: 100.0000 mg | ORAL_TABLET | Freq: Two times a day (BID) | ORAL | 0 refills | Status: DC

## 2017-04-05 NOTE — Patient Instructions (Addendum)
I have sent a prescription to your pharmacy, for an antibiotic pill.  Loratadine-d (non-prescription) will help your congestion.  Other meds to try are robitussin and flonase.   Please continue the same metformin.  Please consider these measures for your health:  minimize alcohol.  Do not use tobacco products.  Have a colonoscopy at least every 10 years from age 65.  Women should have an annual mammogram from age 40.  Keep firearms safely stored.  Always use seat belts.  have working smoke alarms in your home.  See an eye doctor and dentist regularly.  Never drive under the influence of alcohol or drugs (including prescription drugs).  Those with fair skin should take precautions against the sun, and should carefully examine their skin once per month, for any new or changed moles. It is critically important to prevent falling down (keep floor areas well-lit, dry, and free of loose objects.  If you have a cane, walker, or wheelchair, you should use it, even for short trips around the house.  Wear flat-soled shoes.  Also, try not to rush) good diet and exercise significantly improve the control of your diabetes.  please let me know if you wish to be referred to a dietician.  high blood sugar is very risky to your health.  you should see an eye doctor and dentist every year.  It is very important to get all recommended vaccinations.  I'll see you next time.

## 2017-04-05 NOTE — Progress Notes (Signed)
we discussed code status.  pt requests full code, but would not want to be started or maintained on artificial life-support measures if there was not a reasonable chance of recovery 

## 2017-04-05 NOTE — Progress Notes (Signed)
Subjective:    Patient ID: Jeff Lopez, male    DOB: 05-04-1951, 66 y.o.   MRN: [id number]  HPI Pt returns for f/u of diabetes mellitus: DM type: 2 Dx'ed: 0092 Complications: CAD Therapy: metformin.  DKA: never Severe hypoglycemia: never Pancreatitis: never Pancreatic imaging: never Other: he has never taken insulin.   Interval history: pt states few days of moderate pain at both ears, and assoc prod-quality cough. He sees Dr Janace Hoard for sinus problems.   Past Medical History:  Diagnosis Date  . CAD, NATIVE VESSEL 04/19/2010   nonobstructive by cath 2007:  oLAD 20-30%, mLAD 50%, pCFX 20-30%, oAVCFX 20-30%, L renal art 50%;  normal LVF  . Cataract    bil cataracts removed  . COPD (chronic obstructive pulmonary disease) ([place])   . Diabetes mellitus without complication ([place])   . GERD 04/09/2007  . Headache(784.0)   . HYPERLIPIDEMIA 04/09/2007   Patient denies.  [name] HYPERTENSION, BENIGN 04/19/2010  . Hypothyroidism   . Hypothyroidism    previous hyperthyroidism, s/p I-131  . LUMBAR DISC DISORDER 05/27/2010  . Nerve damage    Left leg  . OSTEOARTHRITIS, LUMBAR SPINE 04/09/2007  . RENAL CYST 05/27/2010  . Spinal headache    After having back surgery in 2014    Past Surgical History:  Procedure Laterality Date  . BACK SURGERY  2012,2014  . CARDIAC CATHETERIZATION  2007   Dr Loanne Drilling  . CHOLECYSTECTOMY    . CHOLECYSTECTOMY N/A 07/13/2013   Procedure: LAPAROSCOPIC CHOLECYSTECTOMY WITH INTRAOPERATIVE CHOLANGIOGRAM;  Surgeon: Shann Medal, MD;  Location: [facility];  Service: General;  Laterality: N/A;  . COLONOSCOPY    . COLONOSCOPY W/ POLYPECTOMY    . ERCP N/A 07/14/2013   Procedure: ENDOSCOPIC RETROGRADE CHOLANGIOPANCREATOGRAPHY (ERCP);  Surgeon: [name], MD;  Location: [facility];  Service: Endoscopy;  Laterality: N/A;  . EYE SURGERY Bilateral    Cataract removal  . FOOT FRACTURE SURGERY    . FRACTURE SURGERY    . LUMBAR DISC SURGERY  08/06/2012   L3 & L4  .  LUMBAR DISC SURGERY  11/11/2015   L1 L2    DR NITKA  . LUMBAR FUSION N/A 04/20/2015   Procedure: T12 to L1 fusion (Extension of Previous Fusion L2-S1 to T12-S1), Right Transforaminal lumbar interbody fusion, Posterior Fusion T12 to L1, with Pedicle screws, allograft, local bone graft, and  Vivigen;  Surgeon: [name], MD;  Location: [facility];  Service: Orthopedics;  Laterality: N/A;  . LUMBAR LAMINECTOMY N/A 11/10/2013   Procedure: Left L1-2 far lateral approach to excise herniated nucleus pulposus;  Surgeon: [name], MD;  Location: [facility];  Service: Orthopedics;  Laterality: N/A;  . LUMBAR LAMINECTOMY/DECOMPRESSION MICRODISCECTOMY Left 08/10/2012   Procedure: Dura Repair Left Side L2-L3;  Surgeon: [name], MD;  Location: [facility];  Service: Orthopedics;  Laterality: Left;  Wilson Frame, Sliding table, dura repair kit, microscope  . NECK SURGERY     X 2  . SPINE SURGERY  2006   C-spine surgery x 2  . UPPER GASTROINTESTINAL ENDOSCOPY      Social History   Social History  . Marital status: Single    Spouse name: N/A  . Number of children: 1  . Years of education: N/A   Occupational History  . [employer]   Social History Main Topics  . Smoking status: Former Smoker    Packs/day: 0.50    Years: 44.00    Types: Cigarettes  Quit date: 04/07/2007  . Smokeless tobacco: Never Used     Comment: pt has stopped smoking about 9 months now  . Alcohol use No  . Drug use: No  . Sexual activity: Not Currently   Other Topics Concern  . Not on file   Social History Narrative   Works in Psychologist, educational. Retired.   Lives alone, has one child in [place].     Current Outpatient Prescriptions on File Prior to Visit  Medication Sig Dispense Refill  . irbesartan (AVAPRO) 300 MG tablet Take 1 tablet (300 mg total) by mouth daily. 30 tablet 11  . levothyroxine (SYNTHROID, LEVOTHROID) 125 MCG tablet take 1 tablet by mouth once daily before BREAKFAST. 90 tablet  0  . metFORMIN (GLUCOPHAGE) 500 MG tablet Take 500 mg by mouth daily.    . metoprolol succinate (TOPROL-XL) 50 MG 24 hr tablet Take 1 tablet (50 mg total) by mouth daily. 90 tablet 3  . Multiple Vitamin (MULTIVITAMIN) tablet Take 1 tablet by mouth daily.    . pantoprazole (PROTONIX) 40 MG tablet take 1 tablet by mouth twice a day before MEALS. 60 tablet 11  . spironolactone (ALDACTONE) 50 MG tablet Take 1 tablet (50 mg total) by mouth daily. 30 tablet 11   No current facility-administered medications on file prior to visit.     Allergies  Allergen Reactions  . Ceftin Other (See Comments)    Patient stated it caused "sores in mouth" Thrush  . Cefuroxime Axetil Other (See Comments)    Sores in mouth    Family History  Problem Relation Age of Onset  . Cancer Mother        Breast Cancer, Uterine Cancer  . Breast cancer Mother   . Uterine cancer Mother   . Heart disease Mother   . Heart attack Mother   . Prostate cancer Father   . Hypertension Brother   . Stomach cancer Paternal Grandmother   . Thyroid disease Neg Hx   . Colon cancer Neg Hx   . Esophageal cancer Neg Hx   . Rectal cancer Neg Hx   . Pancreatic cancer Neg Hx     BP 132/90 (BP Location: Left Arm, Patient Position: Sitting, Cuff Size: Normal)   Pulse 77   Temp 98.6 F (37 C) (Oral)   Resp 16   Wt 196 lb 2 oz (89 kg)   SpO2 97%   BMI 27.35 kg/m    Review of Systems Denies fever and sob    Objective:   Physical Exam VITAL SIGNS:  See vs page GENERAL: no distress head: no deformity  eyes: no periorbital swelling, no proptosis  external nose and ears are normal  mouth: no lesion seen Both TM's are red.    Lab Results  Component Value Date   HGBA1C 6.7 04/05/2017      Assessment & Plan:  Type 2 DM, with CAD: well-controlled.   AOM, new.  I have sent a prescription to your pharmacy, for an antibiotic pill.  Loratadine-d (non-prescription) will help your congestion.  Other meds to try are  robitussin and flonase.   Please continue the same metformin.   Subjective:   Patient here for Medicare annual wellness visit and management of other chronic and acute problems.     Risk factors: advanced age    57 of Physicians Providing Medical Care to Patient:  See "snapshot"   Activities of Daily Living: In your present state of health, do you have any difficulty performing the  following activities (lives alone)?:  Preparing food and eating?: No  Bathing yourself: No  Getting dressed: No  Using the toilet:No  Moving around from place to place: No  In the past year have you fallen or had a near fall?: No    Home Safety: Has smoke detector and wears seat belts. No firearms. No excess sun exposure.  Opioid Use: none   Diet and Exercise  Current exercise habits: pt says good Dietary issues discussed: pt reports a healthy diet   Depression Screen  Q1: Over the past two weeks, have you felt down, depressed or hopeless? no  Q2: Over the past two weeks, have you felt little interest or pleasure in doing things? no   The following portions of the patient's history were reviewed and updated as appropriate: allergies, current medications, past family history, past medical history, past social history, past surgical history and problem list.   Review of Systems  Denies hearing loss, and visual loss Objective:   Vision:  Advertising account executive, so he declines VA today.  Hearing: grossly normal Body mass index:  See vs page Msk: pt easily and quickly performs "get-up-and-go" from a sitting position Cognitive Impairment Assessment: cognition, memory and judgment appear normal.  remembers 3/3 at 5 minutes.  excellent recall.  can easily read and write a sentence.  alert and oriented x 3.    Assessment:   Medicare wellness utd on preventive parameters    Plan:   During the course of the visit the patient was educated and counseled about appropriate screening and preventive  services including:        Fall prevention is advised today  Diabetes screening  Nutrition counseling is offered   Vaccines are updated as needed  Patient Instructions (the written plan) was given to the patient.

## 2017-05-13 ENCOUNTER — Other Ambulatory Visit: Payer: Self-pay | Admitting: Internal Medicine

## 2017-06-06 ENCOUNTER — Encounter: Payer: PPO | Admitting: Endocrinology

## 2017-06-14 NOTE — Progress Notes (Signed)
Patient ID: [name]                 DOB: 06/30/1950                      MRN: [id number]     HPI: Jeff Lopez is a 66 y.o. male referred by Dr. Burt Knack and PCP DR Loanne Drilling to HTN clinic. PMH is significant for nonobstructive CAD, cardiac catheterization in 2007, hypertension, COPD, diabetes, and hyperlipidemia. Losartan-HCTZ 100-12.5mg  was discontinued due to hypokalemia concerns and replaced with irbesartan 300mg  daily and spironolactone 25mg  daily. At last visit in September, spironolactone was increased to 50mg  daily. Pt did not show for his follow up visit.   Pt presents today because his BP was elevated at 180//115 at his PCP visit yesterday, although he states his BP was checked as soon as he sat down and was not rechecked again. Pt has been feeling poorly overall. He complains of fatigue and reports only sleeping for 1-2 hours at a time. He has been eating a lot more and has gained 25-30 lbs over the past 6 months. He used to walk at the park 2-3 miles each day but has not done this in the past few months. He has been having some numbness in his feet and hands and thinks this might be due to the shingles vaccine he received recently. He is diabetic but A1c is < 7 so unsure if this could be the beginning of diabetic neuropathy. He does not have a BP cuff at home to check his BP with. He is concerned with all of the medication he is taking because his family members do not take as much as him. He took his BP medications 2 hours ago and has not had any caffeine.   Current HTN meds: metoprolol succinate 50mg  daily, irbesartan 300mg  daily, spironolactone 50mg  daily BP goal: <130/80 mmHg  Family History: [name] family history includes Breast cancer in his mother; Cancer in his mother; Heart attack in his mother (died at 29 from MI); Heart disease in his mother; Hypertension in his brother; Prostate cancer in his father; Stomach cancer in his paternal grandmother; Uterine cancer in  his mother.  Social History: The patient reports that he quit smoking about 9 years ago. His smoking use included Cigarettes. He has a 22.00 pack-year smoking history. He has never used smokeless tobacco. He reports that he does not drink alcohol or use drugs.  Diet: Eats out more than eating at home. States he drinks a lot of Pepsi and Gatorade, and drinks sweet tea when eating out. Trying to eat a low carb diet, but states he's not sure what to eat due to everything containing carbs.   Exercise: Likes to walk in the park, will start back after he recovers from recent sinus surgery.   Home BP readings: Does not check BP at home   Wt Readings from Last 3 Encounters:  04/05/17 196 lb 2 oz (89 kg)  01/18/17 189 lb 1.9 oz (85.8 kg)  01/04/17 184 lb (83.5 kg)   BP Readings from Last 3 Encounters:  04/05/17 132/90  03/01/17 (!) 144/100  02/20/17 (!) 152/94   Pulse Readings from Last 3 Encounters:  04/05/17 77  03/01/17 65  02/20/17 70    Renal function: CrCl cannot be calculated (Patient's most recent lab result is older than the maximum 21 days allowed.).  Past Medical History:  Diagnosis Date  . CAD, NATIVE VESSEL 04/19/2010  nonobstructive by cath 2007:  oLAD 20-30%, mLAD 50%, pCFX 20-30%, oAVCFX 20-30%, L renal art 50%;  normal LVF  . Cataract    bil cataracts removed  . COPD (chronic obstructive pulmonary disease) ([place])   . Diabetes mellitus without complication (Jeff Lopez)   . GERD 04/09/2007  . Headache(784.0)   . HYPERLIPIDEMIA 04/09/2007   Patient denies.  [name] HYPERTENSION, BENIGN 04/19/2010  . Hypothyroidism   . Hypothyroidism    previous hyperthyroidism, s/p I-131  . LUMBAR DISC DISORDER 05/27/2010  . Nerve damage    Left leg  . OSTEOARTHRITIS, LUMBAR SPINE 04/09/2007  . RENAL CYST 05/27/2010  . Spinal headache    After having back surgery in 2014    Current Outpatient Medications on File Prior to Visit  Medication Sig Dispense Refill  . doxycycline  (VIBRA-TABS) 100 MG tablet Take 1 tablet (100 mg total) by mouth 2 (two) times daily. 20 tablet 0  . irbesartan (AVAPRO) 300 MG tablet Take 1 tablet (300 mg total) by mouth daily. 30 tablet 11  . levothyroxine (SYNTHROID, LEVOTHROID) 125 MCG tablet take 1 tablet by mouth once daily before BREAKFAST. 90 tablet 0  . levothyroxine (SYNTHROID, LEVOTHROID) 125 MCG tablet TAKE 1 TABLET BY MOUTH EVERY DAY BEFORE BREAKFAST 90 tablet 0  . metFORMIN (GLUCOPHAGE) 500 MG tablet Take 500 mg by mouth daily.    . metoprolol succinate (TOPROL-XL) 50 MG 24 hr tablet Take 1 tablet (50 mg total) by mouth daily. 90 tablet 3  . Multiple Vitamin (MULTIVITAMIN) tablet Take 1 tablet by mouth daily.    . pantoprazole (PROTONIX) 40 MG tablet take 1 tablet by mouth twice a day before MEALS. 60 tablet 11  . spironolactone (ALDACTONE) 50 MG tablet Take 1 tablet (50 mg total) by mouth daily. 30 tablet 11   No current facility-administered medications on file prior to visit.     Allergies  Allergen Reactions  . Ceftin Other (See Comments)    Patient stated it caused "sores in mouth" Thrush  . Cefuroxime Axetil Other (See Comments)    Sores in mouth     Assessment/Plan:  1. Hypertension - BP is above goal <130/30mmHg in clinic today and has increased since he was last seen. Pt has gained 8 pounds in the past 2 months and has been eating more and exercising less. Discussed that lifestyle is likely contributing to his rise in BP and encouraged pt to resume physical activity and cut back on portion sizes. Will check BMET today since starting spironolactone. If BMET is stable, will plan to increase spironolactone to 100mg  daily and continue irbesartan 300mg  daily and metoprolol succinate 50mg  daily. F/u in HTN clinic in 3 weeks.   Megan E. Supple, PharmD, CPP, [facility] [address], [place], [place] [address] Phone: [phone]; Fax: [phone] 06/15/2017 10:01 AM   Addendum:  BMET shows stable SCr and K. Will continue with plan to increase spironolactone to 100mg  daily. New rx sent in. Pt is aware of lab results and plan.

## 2017-06-15 ENCOUNTER — Ambulatory Visit (INDEPENDENT_AMBULATORY_CARE_PROVIDER_SITE_OTHER): Payer: PPO | Admitting: Pharmacist

## 2017-06-15 VITALS — BP 160/108 | HR 68 | Wt 203.5 lb

## 2017-06-15 DIAGNOSIS — I10 Essential (primary) hypertension: Secondary | ICD-10-CM

## 2017-06-15 LAB — BASIC METABOLIC PANEL
BUN/Creatinine Ratio: 15 (ref 10–24)
BUN: 16 mg/dL (ref 8–27)
CALCIUM: 9 mg/dL (ref 8.6–10.2)
CO2: 23 mmol/L (ref 20–29)
CREATININE: 1.09 mg/dL (ref 0.76–1.27)
Chloride: 105 mmol/L (ref 96–106)
GFR calc Af Amer: 82 mL/min/{1.73_m2} (ref >59)
GFR, EST NON AFRICAN AMERICAN: 71 mL/min/{1.73_m2} (ref >59)
Glucose: 141 mg/dL — ABNORMAL HIGH (ref 65–99)
Potassium: 4 mmol/L (ref 3.5–5.2)
Sodium: 143 mmol/L (ref 134–144)

## 2017-06-15 MED ORDER — SPIRONOLACTONE 100 MG PO TABS: 100.0000 mg | ORAL_TABLET | Freq: Every day | ORAL | 11 refills | Status: DC

## 2017-06-15 NOTE — Patient Instructions (Signed)
It was nice to see you today  If your labwork is stable, we will increase your spironolactone to 50mg  daily  Continue your other medications  Follow up in clinic in 3 weeks

## 2017-06-20 DIAGNOSIS — M79642 Pain in left hand: Secondary | ICD-10-CM | POA: Diagnosis not present

## 2017-06-20 DIAGNOSIS — M180 Bilateral primary osteoarthritis of first carpometacarpal joints: Secondary | ICD-10-CM | POA: Diagnosis not present

## 2017-06-20 DIAGNOSIS — R52 Pain, unspecified: Secondary | ICD-10-CM | POA: Diagnosis not present

## 2017-06-20 DIAGNOSIS — M79641 Pain in right hand: Secondary | ICD-10-CM | POA: Diagnosis not present

## 2017-06-21 ENCOUNTER — Encounter: Payer: Self-pay | Admitting: Endocrinology

## 2017-06-29 ENCOUNTER — Ambulatory Visit (INDEPENDENT_AMBULATORY_CARE_PROVIDER_SITE_OTHER): Payer: PPO

## 2017-06-29 ENCOUNTER — Encounter: Payer: Self-pay | Admitting: Podiatry

## 2017-06-29 ENCOUNTER — Ambulatory Visit: Payer: PPO | Admitting: Podiatry

## 2017-06-29 DIAGNOSIS — M722 Plantar fascial fibromatosis: Secondary | ICD-10-CM

## 2017-06-29 DIAGNOSIS — G629 Polyneuropathy, unspecified: Secondary | ICD-10-CM

## 2017-06-29 MED ORDER — GABAPENTIN 300 MG PO CAPS: 300.0000 mg | ORAL_CAPSULE | Freq: Three times a day (TID) | ORAL | 3 refills | Status: DC

## 2017-06-29 NOTE — Patient Instructions (Addendum)
Fascitis plantar  (Plantar Fasciitis)  La fascitis plantar es una afeccin dolorosa que se produce en el taln. Ocurre cuando la banda de tejido que conecta los dedos con el hueso del taln (fascia plantar) se irrita. Esto puede ocurrir despus de hacer mucho ejercicio u otras actividades repetitivas (lesin por uso excesivo). El dolor de la fascitis plantar puede ser de leve (irritacin) a intenso, y en los casos ms agudos puede dificultar que la persona camine o se mueva. Por lo general, el dolor es peor a la maana o despus de permanecer sentado o acostado durante un perodo.  CAUSAS  Este trastorno puede ser causado por:   Estar de pie durante largos perodos.   Usar zapatos que no calcen bien.   Practicar actividades de alto impacto, como correr, o hacer ejercicios aerbicos o ballet.   Tener sobrepeso.   Tener una forma de caminar (marcha) anormal.   Tener los msculos de la pantorrilla tensos.   Tener el arco alto en los pies.   Comenzar una nueva actividad fsica.  SNTOMAS  El sntoma principal de esta afeccin es el dolor en el taln. Otros sntomas pueden ser los siguientes:   Dolor que empeora despus de una actividad o un ejercicio.   Dolor ms intenso a la maana o despus de descansar.   Dolor que desaparece despus de caminar durante unos minutos.  DIAGNSTICO  Esta afeccin se puede diagnosticar en funcin de los signos y los sntomas. El mdico tambin le realizar un examen fsico para controlar si tiene lo siguiente:   Un rea dolorida en la parte inferior del pie.   El arco alto.   Dolor al mover el pie.   Dificultad para mover el pie.  Tambin puede necesitar estudios por imgenes para confirmar el diagnstico. Estos pueden incluir los siguientes:   Radiografas.   Ecografa.   Resonancia magntica.  TRATAMIENTO  El tratamiento de la fascitis plantar depende de la gravedad de la afeccin. El tratamiento puede incluir lo siguiente:   Reposo, hielo y analgsicos de venta  libre para controlar el dolor.   Ejercicios para estirar las pantorrillas y la fascia plantar.   Una frula que mantiene el pie estirado y hacia arriba mientras usted duerme (frula nocturna).   Fisioterapia para aliviar los sntomas y evitar problemas en el futuro.   Inyecciones de cortisona para aliviar el dolor intenso.   Tratamiento con ondas de choque extracorpreas para estimular con impulsos elctricos la fascia plantar lesionada. Esto suele usarse como un ltimo recurso antes de la ciruga.   Ciruga, si los otros tratamientos no han funcionado despus de 12meses.  INSTRUCCIONES PARA EL CUIDADO EN EL HOGAR   Tome los medicamentos solamente como se lo haya indicado el mdico.   Evite las actividades que causan dolor.   Frote la parte inferior del pie sobre una bolsa de hielo o una botella de agua fra. Haga esto durante 20minutos, de 3a 4veces al da.   Realice estiramientos simples como se lo haya indicado el mdico.   Trate de usar calzado deportivo con amortiguacin de aire o gel, o plantillas blandas.   Si el mdico se lo ha indicado, use una frula nocturna para dormir.   Cumpla con todas las visitas de control, segn le indique su mdico.  PREVENCIN   No realice ejercicios ni actividades que le causen dolor en el taln.   Considere la posibilidad de empezar actividades de bajo impacto si sigue teniendo problemas.   Pierda peso si   de [employer] diarias. Esta informacin no tiene Marine scientist el consejo del mdico. Asegrese de hacerle al mdico cualquier pregunta que tenga. Document Released: 03/22/2005 Document Revised: 10/04/2015 Document Reviewed: 04/22/2014 Elsevier  Interactive Patient Education  2018 [place] (ROM) y ejercicios de estiramiento-fascitis plantar (sndrome del ESPOLN del taln) Estos ejercicios pueden ayudarlo cuando empiece a Education administrator su lesin. Sus sntomas pueden resolverse con o sin una mayor implicacin de su mdico, fisioterapeuta o Administrator, sports atltico. Al completar estos ejercicios, recuerde:  Chartered certified accountant la flexibilidad del tejido ayuda a que el movimiento normal regrese a las articulaciones. Esto permite un movimiento y Ardelia Mems actividad ms saludables y [employer].   Debe realizarse un estiramiento efectivo durante al menos 30 segundos.   Un estiramiento nunca debe ser doloroso. Slo debe sentir un alargamiento o liberacin suave en el tejido estirado.  GAMA de movimiento-extensin del dedo del pie, flexin   Sintese con la pierna derecha/izquierda cruzada sobre la rodilla opuesta.   Agarre los dedos de los pies y tire suavemente hacia atrs hacia la parte superior del pie. Usted debe sentir un estiramiento en la parte inferior de los dedos de los pies y/o pie   Mantn este estiramiento durante 10 segundos.   Ahora, tire suavemente de los dedos de los pies hacia la parte inferior del pie. Usted debe sentir un estiramiento en la parte superior de los dedos de los pies y o pie.    Mantn este estiramiento durante 10 segundos.  repetir tiempos.  Completa este tramo 3 veces al da   GAMA de movimiento-dorsiflexin del tobillo, Active Assisted  Quite los zapatos y sintese en una silla que preferiblemente no est en una superficie alfombrada.   Coloque el pie derecho/izquierdo debajo de la rodilla. Extienda su pierna opuesta para el apoyo.  Manteniendo el taln hacia abajo, deslice el pie derecho/izquierdo hacia la silla hasta que sienta un estiramiento en el tobillo o la pantorrilla. Si no siente un estiramiento, deslice su parte inferior hacia adelante [employer] borde de la silla,  mientras mantiene el taln [place].   Mantn este estiramiento durante 10 segundos.  repetir 3 veces. Completa este tramo 2 veces al da.   ESTIRAMIENTO gastroc, standing   Coloque las manos en la pared.   Extender la pierna derecha/izquierda, manteniendo la rodilla delantera algo doblada.    Apunta ligeramente los dedos hacia adentro en el pie trasero  Manteniendo el taln derecho/izquierdo en el suelo y la rodilla recta, cambia el peso hacia la pared, no permitiendo que la espalda se arquee.   Usted debe sentir un estiramiento suave en la pantorrilla derecha/izquierda. Mantenga esta posicin durante 10 segundos.  repetir 3 veces.  Completa este tramo 2 veces al da   Estiramiento soleus, parada  [employer] en la pared.    Extender la pierna derecha/izquierda, manteniendo la otra rodilla algo doblada.   Apunta ligeramente los dedos hacia adentro en el pie trasero.  Mantn el taln derecho/izquierdo en el suelo, dobla la rodilla de la espalda y Insurance risk surveyor ligeramente el peso sobre la pierna trasera para que sientas un suave estiramiento profundo en la pantorrilla de atrs.   Mantenga esta posicin durante 10 segundos.  repetir 3 veces.  Completa este tramo 2 veces al da.   ESTIRAMIENTO Gastrocsoleus, parado Nota: este ejercicio puede poner mucho estrs en el pie y el tobillo. Por favor complete este ejercicio solo si su cuidador lo instruye especficamente.   Coloque  la bola de su pie derecho/izquierdo sobre un escaln, manteniendo el otro pie firmemente en el mismo paso.    Sujete la pared o un riel para Careers adviser.    Levante lentamente el [employer], permitiendo que su peso corporal Presione el taln hacia abajo sobre el borde del escaln.    Usted debe sentir un estiramiento en su becerro derecho/izquierdo.    Mantenga esta posicin durante 10 segundos.    Repita este ejercicio con una leve curvatura en la rodilla derecha/izquierda.  repetir 3 veces.    Completa este tramo 2 veces al da.    FUERZA-toalla rizos   Sit in a chair positioned on a non-carpeted surface.    Sintese en una silla colocada sobre una superficie no alfombrada.   Coloque el pie sobre una toalla, manteniendo el taln en el suelo.    Tire de la toalla hacia el taln con slo rizar los dedos de los pies. Mantn el taln en el suelo.  repetir 3 veces.   Completa este ejercicio 2 veces al da  FUERZA-de inversin de tobillo   Asegure un extremo de una banda/tubo de ejercicio de goma a un objeto fijo (mesa, poste)   Enrolle el otro extremo alrededor del pie justo antes de los dedos de los pies.    Coloca los puos [employer] rodillas. Esto concentrar su fortalecimiento en su tobillo.   Lentamente, tire de su dedo gordo [place], asegurndose de que la banda/tubo est posicionado para resistir todo Actuary.    Mantenga esta posicin durante 10 segundos.     Los msculos se resisten a la banda/tubo ya que lentamente empuja el pie hacia la posicin inicial.  repetir 3 veces.  [place]

## 2017-06-29 NOTE — Progress Notes (Signed)
Subjective:   Patient ID: Jeff Lopez, male   DOB: 67 y.o.   MRN: [id number]   HPI Patient states has had some discomfort in the mid arch area left and also some stinging in the lesser digits left with possibility for neuropathic-like symptoms.  Patient was noted to have good digital perfusion and states he does not currently smoke and likes to be active   Review of Systems  All other systems reviewed and are negative.       Objective:  Physical Exam  Constitutional: He appears well-developed and well-nourished.  Cardiovascular: Intact distal pulses.  Pulmonary/Chest: Effort normal.  Musculoskeletal: Normal range of motion.  Neurological: He is alert.  Skin: Skin is warm.  Nursing note and vitals reviewed. Patient has good neurovascular status currently with patient noted to have inflammation of the mid arch area left slightly around posterior tibial tendon and also is noted to have tingling in the lesser digits with a slight diminishment of sharp dull vibratory.  Patient was noted to have good digital perfusion well oriented x3      Assessment:  Inflammatory changes with chronic mid arch pain with orthotics which have been successful in the past     Plan:  H&P x-rays performed and conditions discussed.  At this point patient is scanned for customized orthotics to reduce plantar stresses and is advised on physical therapy anti-inflammatories and supportive shoe gear.  Patient will be seen back when orthotics are returned.  X-rays indicate spur formation with no indications of stress fracture or advanced arthritis

## 2017-06-29 NOTE — Progress Notes (Signed)
   Subjective:    Patient ID: Jeff Lopez, male    DOB: June 09, 1951, 67 y.o.   MRN: [id number]  HPI    Review of Systems  All other systems reviewed and are negative.      Objective:   Physical Exam        Assessment & Plan:

## 2017-07-05 ENCOUNTER — Ambulatory Visit (INDEPENDENT_AMBULATORY_CARE_PROVIDER_SITE_OTHER): Payer: PPO | Admitting: Pharmacist

## 2017-07-05 ENCOUNTER — Encounter: Payer: Self-pay | Admitting: Pharmacist

## 2017-07-05 VITALS — BP 138/88 | HR 67 | Wt 201.6 lb

## 2017-07-05 DIAGNOSIS — I10 Essential (primary) hypertension: Secondary | ICD-10-CM | POA: Diagnosis not present

## 2017-07-05 LAB — BASIC METABOLIC PANEL
BUN/Creatinine Ratio: 15 (ref 10–24)
BUN: 19 mg/dL (ref 8–27)
CALCIUM: 9.1 mg/dL (ref 8.6–10.2)
CHLORIDE: 103 mmol/L (ref 96–106)
CO2: 21 mmol/L (ref 20–29)
Creatinine, Ser: 1.29 mg/dL — ABNORMAL HIGH (ref 0.76–1.27)
GFR calc non Af Amer: 58 mL/min/{1.73_m2} — ABNORMAL LOW (ref >59)
GFR, EST AFRICAN AMERICAN: 67 mL/min/{1.73_m2} (ref >59)
Glucose: 140 mg/dL — ABNORMAL HIGH (ref 65–99)
Potassium: 4.3 mmol/L (ref 3.5–5.2)
Sodium: 140 mmol/L (ref 134–144)

## 2017-07-05 NOTE — Progress Notes (Signed)
Patient ID: Jeff Lopez                 DOB: 03-02-1951                      MRN: [id number]     HPI: Jeff Lopez is a 67 y.o. male patient of Dr. Burt Knack who presents today for hypertension follow up. PMH significant for nonobstructive CAD, cardiac catheterization in 2007, hypertension, COPD, diabetes, and hyperlipidemia. Losartan-HCTZ 100-12.5mg  was discontinued due to hypokalemia concerns and replacedwithirbesartan 300mg  daily. At his last OV his spironolactone was increased to 100mg  daily.   Today he presents for follow up and reports that he has been taking only spironolactone 50mg  daily. He states he remembered Jeff Lopez saying that she was going to double the dose and he was previously only taking 25mg  daily. He states he may have misunderstood the instructions from previous and never increased to 50mg  daily. He also reports he completed his supply of 25mg  daily about 10 days ago and then started the 50mg  dose (he is actually taking 1/2 tablet of 100mg ). He did NOT take 2 tablets of his 25mg  prior to 10 days ago.   He states overall he is doing well, but wants to come off ALL of his medications. He states he has considered stopping all of them on his own. We had lengthy conversation about why he should not do this. We also discussed lifestyle modifications that would help decrease the number of medications that he takes. He knows this will help, but has been reluctant to actually implement these.    Current HTN meds:  Irbesartan 300mg  daily  Metoprolol succinate 50mg  daily  Spironolactone 50mg  daily   BP goal:<130/80 mmHg  Family History:[name] family history includes Breast cancer in his mother; Cancer in his mother; Heart attack in his mother (died at 68 from MI); Heart disease in his mother; Hypertension in his brother; Prostate cancer in his father; Stomach cancer in his paternal grandmother; Uterine cancer in his mother.  Social History:The patient reports that  he quit smoking about 9 years ago. His smoking use included Cigarettes. He has a 22.00 pack-year smoking history. He has never used smokeless tobacco. He reports that he does not drink alcohol or use drugs.  Diet:Eats out more than eating at home.States he drinks a lot of Pepsi and Gatorade, and drinks sweet tea when eating out.Trying to eat a low carb diet, but states he's not sure what to eat due to everything containing carbs.  Exercise:Likes to walk in the park, will start back after he recovers from recent sinus surgery.He plans on trying to do this now to help with his weight gain.   Home BP readings:Does not check BP at home  Wt Readings from Last 3 Encounters:  07/05/17 201 lb 9.6 oz (91.4 kg)  06/15/17 203 lb 8 oz (92.3 kg)  04/05/17 196 lb 2 oz (89 kg)   BP Readings from Last 3 Encounters:  07/05/17 138/88  06/15/17 (!) 160/108  04/05/17 132/90   Pulse Readings from Last 3 Encounters:  07/05/17 67  06/15/17 68  04/05/17 77    Renal function: Estimated Creatinine Clearance: 78.1 mL/min (by C-G formula based on SCr of 1.09 mg/dL).  Past Medical History:  Diagnosis Date  . CAD, NATIVE VESSEL 04/19/2010   nonobstructive by cath 2007:  oLAD 20-30%, mLAD 50%, pCFX 20-30%, oAVCFX 20-30%, L renal art 50%;  normal LVF  . Cataract  bil cataracts removed  . COPD (chronic obstructive pulmonary disease) ([place])   . Diabetes mellitus without complication ([place])   . GERD 04/09/2007  . Headache(784.0)   . HYPERLIPIDEMIA 04/09/2007   Patient denies.  [name] HYPERTENSION, BENIGN 04/19/2010  . Hypothyroidism   . Hypothyroidism    previous hyperthyroidism, s/p I-131  . LUMBAR DISC DISORDER 05/27/2010  . Nerve damage    Left leg  . OSTEOARTHRITIS, LUMBAR SPINE 04/09/2007  . RENAL CYST 05/27/2010  . Spinal headache    After having back surgery in 2014    Current Outpatient Medications on File Prior to Visit  Medication Sig Dispense Refill  . gabapentin (NEURONTIN) 300 MG  capsule Take 1 capsule (300 mg total) by mouth 3 (three) times daily. 90 capsule 3  . irbesartan (AVAPRO) 300 MG tablet Take 1 tablet (300 mg total) by mouth daily. 30 tablet 11  . levothyroxine (SYNTHROID, LEVOTHROID) 125 MCG tablet TAKE 1 TABLET BY MOUTH EVERY DAY BEFORE BREAKFAST 90 tablet 0  . metFORMIN (GLUCOPHAGE) 500 MG tablet Take 500 mg by mouth daily.    . metoprolol succinate (TOPROL-XL) 50 MG 24 hr tablet Take 1 tablet (50 mg total) by mouth daily. 90 tablet 3  . Multiple Vitamin (MULTIVITAMIN) tablet Take 1 tablet by mouth daily.    . pantoprazole (PROTONIX) 40 MG tablet take 1 tablet by mouth twice a day before MEALS. 60 tablet 11  . spironolactone (ALDACTONE) 50 MG tablet Take 1 tablet (50 mg total) by mouth daily. 90 tablet 1   No current facility-administered medications on file prior to visit.     Allergies  Allergen Reactions  . Ceftin Other (See Comments)    Patient stated it caused "sores in mouth" Thrush  . Cefuroxime Axetil Other (See Comments)    Sores in mouth    Blood pressure 138/88, pulse 67, weight 201 lb 9.6 oz (91.4 kg).   Assessment/Plan: Hypertension: Repeat BMET today on spironolactone 50mg  daily. Will keep medications as prescribed for now since he has only been taking increased dose of spironolactone for about 10 days. Will follow up in 3 weeks for additional adjustment if needed. Would plan to go to spironolactone 100mg  daily (as previously discussed) if pressures not at goal on next visit.    Thank you, Lelan Pons. Patterson Hammersmith, [facility]  07/05/2017 11:55 AM

## 2017-07-05 NOTE — Patient Instructions (Addendum)
We will do blood work today.   Continue spironolactone 50mg  daily (1/2 tablet of your current supply), metoprolol 50mg  daily, and irbesartan 300mg  daily   Start to increase your exercise. This will help with your blood pressure.

## 2017-07-11 ENCOUNTER — Ambulatory Visit: Payer: PPO | Admitting: Endocrinology

## 2017-07-18 ENCOUNTER — Other Ambulatory Visit: Payer: Self-pay | Admitting: Endocrinology

## 2017-07-30 ENCOUNTER — Encounter: Payer: PPO | Admitting: Orthotics

## 2017-08-02 ENCOUNTER — Ambulatory Visit: Payer: PPO | Admitting: Pharmacist

## 2017-08-02 NOTE — Progress Notes (Deleted)
Patient ID: Jeff Lopez                 DOB: 04-15-1951                      MRN: [id number]     HPI: Jeff Lopez is a 67 y.o. male patient of Dr. Burt Knack who presents today for hypertension follow up. PMH is significant for nonobstructive CAD, cardiac catheterization in 2007, hypertension, COPD, diabetes, and hyperlipidemia. Losartan-HCTZ 100-12.5mg  was discontinued due to hypokalemia concerns and replacedwithirbesartan 300mg  daily. His spironolactone has been titrated most recently to help with BP control.  Taking spiro 50 and labs stable - increase to 100 (full tab) if needed Exercising more?   He states overall he is doing well, but wants to come off ALL of his medications. He states he has considered stopping all of them on his own. We had lengthy conversation about why he should not do this. We also discussed lifestyle modifications that would help decrease the number of medications that he takes. He knows this will help, but has been reluctant to actually implement these.    Current HTN meds: irbesartan 300mg  daily, metoprolol succinate 50mg  daily, spironolactone 50mg  daily   BP goal:<130/80 mmHg  Family History:[name] family history includes Breast cancer in his mother; Cancer in his mother; Heart attack in his mother (died at 64 from MI); Heart disease in his mother; Hypertension in his brother; Prostate cancer in his father; Stomach cancer in his paternal grandmother; Uterine cancer in his mother.  Social History:The patient reports that he quit smoking about 9 years ago. His smoking use included Cigarettes. He has a 22.00 pack-year smoking history. He has never used smokeless tobacco. He reports that he does not drink alcohol or use drugs.  Diet:Eats out more than eating at home.States he drinks a lot of Pepsi and Gatorade, and drinks sweet tea when eating out.Trying to eat a low carb diet, but states he's not sure what to eat due to everything  containing carbs.  Exercise:Likes to walk in the park, will start back after he recovers from recent sinus surgery.He plans on trying to do this now to help with his weight gain.   Home BP readings:Does not check BP at home  Wt Readings from Last 3 Encounters:  07/05/17 201 lb 9.6 oz (91.4 kg)  06/15/17 203 lb 8 oz (92.3 kg)  04/05/17 196 lb 2 oz (89 kg)   BP Readings from Last 3 Encounters:  07/05/17 138/88  06/15/17 (!) 160/108  04/05/17 132/90   Pulse Readings from Last 3 Encounters:  07/05/17 67  06/15/17 68  04/05/17 77    Renal function: CrCl cannot be calculated (Patient's most recent lab result is older than the maximum 21 days allowed.).  Past Medical History:  Diagnosis Date  . CAD, NATIVE VESSEL 04/19/2010   nonobstructive by cath 2007:  oLAD 20-30%, mLAD 50%, pCFX 20-30%, oAVCFX 20-30%, L renal art 50%;  normal LVF  . Cataract    bil cataracts removed  . COPD (chronic obstructive pulmonary disease) ([place])   . Diabetes mellitus without complication ([place])   . GERD 04/09/2007  . Headache(784.0)   . HYPERLIPIDEMIA 04/09/2007   Patient denies.  [name] HYPERTENSION, BENIGN 04/19/2010  . Hypothyroidism   . Hypothyroidism    previous hyperthyroidism, s/p I-131  . LUMBAR DISC DISORDER 05/27/2010  . Nerve damage    Left leg  . OSTEOARTHRITIS, LUMBAR SPINE 04/09/2007  .  RENAL CYST 05/27/2010  . Spinal headache    After having back surgery in 2014    Current Outpatient Medications on File Prior to Visit  Medication Sig Dispense Refill  . gabapentin (NEURONTIN) 300 MG capsule Take 1 capsule (300 mg total) by mouth 3 (three) times daily. 90 capsule 3  . irbesartan (AVAPRO) 300 MG tablet Take 1 tablet (300 mg total) by mouth daily. 30 tablet 11  . levothyroxine (SYNTHROID, LEVOTHROID) 125 MCG tablet TAKE 1 TABLET BY MOUTH EVERY DAY BEFORE BREAKFAST 90 tablet 0  . metFORMIN (GLUCOPHAGE) 500 MG tablet Take 500 mg by mouth daily.    . metFORMIN (GLUCOPHAGE-XR) 500 MG  24 hr tablet TAKE 1 TABLET BY MOUTH ONCE DAILY WITH BREAKFAST 90 tablet 0  . metoprolol succinate (TOPROL-XL) 50 MG 24 hr tablet Take 1 tablet (50 mg total) by mouth daily. 90 tablet 3  . Multiple Vitamin (MULTIVITAMIN) tablet Take 1 tablet by mouth daily.    . pantoprazole (PROTONIX) 40 MG tablet take 1 tablet by mouth twice a day before MEALS. 60 tablet 11  . spironolactone (ALDACTONE) 50 MG tablet Take 1 tablet (50 mg total) by mouth daily. 90 tablet 1   No current facility-administered medications on file prior to visit.     Allergies  Allergen Reactions  . Ceftin Other (See Comments)    Patient stated it caused "sores in mouth" Thrush  . Cefuroxime Axetil Other (See Comments)    Sores in mouth    There were no vitals taken for this visit.   Assessment/Plan: Hypertension: Repeat BMET today on spironolactone 50mg  daily. Will keep medications as prescribed for now since he has only been taking increased dose of spironolactone for about 10 days. Will follow up in 3 weeks for additional adjustment if needed. Would plan to go to spironolactone 100mg  daily (as previously discussed) if pressures not at goal on next visit.

## 2017-08-06 ENCOUNTER — Ambulatory Visit (INDEPENDENT_AMBULATORY_CARE_PROVIDER_SITE_OTHER): Payer: PPO | Admitting: Orthotics

## 2017-08-06 DIAGNOSIS — M722 Plantar fascial fibromatosis: Secondary | ICD-10-CM

## 2017-08-14 ENCOUNTER — Other Ambulatory Visit: Payer: Self-pay | Admitting: Internal Medicine

## 2017-08-14 DIAGNOSIS — L219 Seborrheic dermatitis, unspecified: Secondary | ICD-10-CM | POA: Diagnosis not present

## 2017-08-14 DIAGNOSIS — L570 Actinic keratosis: Secondary | ICD-10-CM | POA: Diagnosis not present

## 2017-08-14 DIAGNOSIS — L299 Pruritus, unspecified: Secondary | ICD-10-CM | POA: Diagnosis not present

## 2017-08-14 DIAGNOSIS — L821 Other seborrheic keratosis: Secondary | ICD-10-CM | POA: Diagnosis not present

## 2017-08-14 DIAGNOSIS — L814 Other melanin hyperpigmentation: Secondary | ICD-10-CM | POA: Diagnosis not present

## 2017-08-22 ENCOUNTER — Other Ambulatory Visit: Payer: Self-pay | Admitting: *Deleted

## 2017-08-22 MED ORDER — IRBESARTAN 150 MG PO TABS: 300.0000 mg | ORAL_TABLET | Freq: Every day | ORAL | 1 refills | Status: DC

## 2017-08-22 NOTE — Telephone Encounter (Signed)
Per fax from [facility] irbesartan 300mg  tabs are on backorder. They are requesting an order for the 150mg  tab with a sig of two tabs qd. Tried several times to reach patient to make him aware but was unsuccessful and he did not have a vm set up. Will call pharmacy to be sure that they inform patient of change so he will be aware that he needs to take two tabs to equal correct dose of 300 mg qd.

## 2017-10-03 ENCOUNTER — Ambulatory Visit (INDEPENDENT_AMBULATORY_CARE_PROVIDER_SITE_OTHER): Payer: PPO | Admitting: Endocrinology

## 2017-10-03 ENCOUNTER — Encounter: Payer: Self-pay | Admitting: Endocrinology

## 2017-10-03 VITALS — BP 118/80 | HR 72 | Wt 207.8 lb

## 2017-10-03 DIAGNOSIS — R200 Anesthesia of skin: Secondary | ICD-10-CM

## 2017-10-03 DIAGNOSIS — R0600 Dyspnea, unspecified: Secondary | ICD-10-CM | POA: Diagnosis not present

## 2017-10-03 DIAGNOSIS — M2550 Pain in unspecified joint: Secondary | ICD-10-CM | POA: Diagnosis not present

## 2017-10-03 DIAGNOSIS — E119 Type 2 diabetes mellitus without complications: Secondary | ICD-10-CM

## 2017-10-03 DIAGNOSIS — E876 Hypokalemia: Secondary | ICD-10-CM

## 2017-10-03 DIAGNOSIS — R5383 Other fatigue: Secondary | ICD-10-CM | POA: Diagnosis not present

## 2017-10-03 DIAGNOSIS — E890 Postprocedural hypothyroidism: Secondary | ICD-10-CM | POA: Diagnosis not present

## 2017-10-03 LAB — POCT GLYCOSYLATED HEMOGLOBIN (HGB A1C): HEMOGLOBIN A1C: 7.4

## 2017-10-03 NOTE — Progress Notes (Signed)
Subjective:    Patient ID: Jeff Lopez, male    DOB: 1950-09-16, 67 y.o.   MRN: [id number]  HPI Pt returns for f/u of diabetes mellitus: DM type: 2 Dx'ed: 5621 Complications: CAD Therapy: metformin.  DKA: never Severe hypoglycemia: never Pancreatitis: never Pancreatic imaging: never Other: he has never taken insulin.   Interval history: He reports fatigue, and weight gain.   Past Medical History:  Diagnosis Date  . CAD, NATIVE VESSEL 04/19/2010   nonobstructive by cath 2007:  oLAD 20-30%, mLAD 50%, pCFX 20-30%, oAVCFX 20-30%, L renal art 50%;  normal LVF  . Cataract    bil cataracts removed  . COPD (chronic obstructive pulmonary disease) ([place])   . Diabetes mellitus without complication ([place])   . GERD 04/09/2007  . Headache(784.0)   . HYPERLIPIDEMIA 04/09/2007   Patient denies.  [name] HYPERTENSION, BENIGN 04/19/2010  . Hypothyroidism   . Hypothyroidism    previous hyperthyroidism, s/p I-131  . LUMBAR DISC DISORDER 05/27/2010  . Nerve damage    Left leg  . OSTEOARTHRITIS, LUMBAR SPINE 04/09/2007  . RENAL CYST 05/27/2010  . Spinal headache    After having back surgery in 2014    Past Surgical History:  Procedure Laterality Date  . BACK SURGERY  2012,2014  . CARDIAC CATHETERIZATION  2007   Dr Loanne Drilling  . CHOLECYSTECTOMY    . CHOLECYSTECTOMY N/A 07/13/2013   Procedure: LAPAROSCOPIC CHOLECYSTECTOMY WITH INTRAOPERATIVE CHOLANGIOGRAM;  Surgeon: Shann Medal, MD;  [facility]: [facility];  Service: General;  Laterality: N/A;  . COLONOSCOPY    . COLONOSCOPY W/ POLYPECTOMY    . ERCP N/A 07/14/2013   Procedure: ENDOSCOPIC RETROGRADE CHOLANGIOPANCREATOGRAPHY (ERCP);  Surgeon: [name], MD;  Location: [facility];  Service: Endoscopy;  Laterality: N/A;  . EYE SURGERY Bilateral    Cataract removal  . FOOT FRACTURE SURGERY    . FRACTURE SURGERY    . LUMBAR DISC SURGERY  08/06/2012   L3 & L4  . LUMBAR DISC SURGERY  11/11/2015   L1 L2    DR NITKA  . LUMBAR FUSION N/A  04/20/2015   Procedure: T12 to L1 fusion (Extension of Previous Fusion L2-S1 to T12-S1), Right Transforaminal lumbar interbody fusion, Posterior Fusion T12 to L1, with Pedicle screws, allograft, local bone graft, and  Vivigen;  Surgeon: [name], MD;  Location: [facility];  Service: Orthopedics;  Laterality: N/A;  . LUMBAR LAMINECTOMY N/A 11/10/2013   Procedure: Left L1-2 far lateral approach to excise herniated nucleus pulposus;  Surgeon: [name], MD;  Location: [facility];  Service: Orthopedics;  Laterality: N/A;  . LUMBAR LAMINECTOMY/DECOMPRESSION MICRODISCECTOMY Left 08/10/2012   Procedure: Dura Repair Left Side L2-L3;  Surgeon: [name], MD;  Location: [facility];  Service: Orthopedics;  Laterality: Left;  Wilson Frame, Sliding table, dura repair kit, microscope  . NECK SURGERY     X 2  . SPINE SURGERY  2006   C-spine surgery x 2  . UPPER GASTROINTESTINAL ENDOSCOPY      Social History   Socioeconomic History  . Marital status: Single    Spouse name: Not on file  . Number of children: 1  . Years of education: Not on file  . Highest education level: Not on file  Occupational History  . Occupation: DISTRIBUTION [employer]    Employer: [employer]  Social Needs  . Financial resource strain: Not on file  . Food insecurity:    Worry: Not on file    Inability: Not  on file  . Transportation needs:    Medical: Not on file    Non-medical: Not on file  Tobacco Use  . Smoking status: Former Smoker    Packs/day: 0.50    Years: 44.00    Pack years: 22.00    Types: Cigarettes    Last attempt to quit: 04/07/2007    Years since quitting: 10.5  . Smokeless tobacco: Never Used  . Tobacco comment: pt has stopped smoking about 9 months now  Substance and Sexual Activity  . Alcohol use: No  . Drug use: No  . Sexual activity: Not Currently  Lifestyle  . Physical activity:    Days per week: Not on file    Minutes per session: Not on file  . Stress: Not on file  Relationships  .  Social connections:    Talks on phone: Not on file    Gets together: Not on file    Attends religious service: Not on file    Active member of club or organization: Not on file    Attends meetings of clubs or organizations: Not on file    Relationship status: Not on file  . Intimate partner violence:    Fear of current or ex partner: Not on file    Emotionally abused: Not on file    Physically abused: Not on file    Forced sexual activity: Not on file  Other Topics Concern  . Not on file  Social History Narrative   Works in Psychologist, educational. Retired.   Lives alone, has one child in [place].     Current Outpatient Medications on File Prior to Visit  Medication Sig Dispense Refill  . gabapentin (NEURONTIN) 300 MG capsule Take 1 capsule (300 mg total) by mouth 3 (three) times daily. 90 capsule 3  . irbesartan (AVAPRO) 150 MG tablet Take 2 tablets (300 mg total) by mouth daily. 180 tablet 1  . levothyroxine (SYNTHROID, LEVOTHROID) 125 MCG tablet TAKE 1 TABLET BY MOUTH EVERY DAY BEFORE BREAKFAST 90 tablet 0  . metFORMIN (GLUCOPHAGE-XR) 500 MG 24 hr tablet TAKE 1 TABLET BY MOUTH ONCE DAILY WITH BREAKFAST 90 tablet 0  . metoprolol succinate (TOPROL-XL) 50 MG 24 hr tablet Take 1 tablet (50 mg total) by mouth daily. 90 tablet 3  . Multiple Vitamin (MULTIVITAMIN) tablet Take 1 tablet by mouth daily.    . pantoprazole (PROTONIX) 40 MG tablet take 1 tablet by mouth twice a day before MEALS. 60 tablet 11  . spironolactone (ALDACTONE) 50 MG tablet Take 1 tablet (50 mg total) by mouth daily. 90 tablet 1  . metFORMIN (GLUCOPHAGE) 500 MG tablet Take 500 mg by mouth daily.     No current facility-administered medications on file prior to visit.     Allergies  Allergen Reactions  . Ceftin Other (See Comments)    Patient stated it caused "sores in mouth" Thrush  . Cefuroxime Axetil Other (See Comments)    Sores in mouth    Family History  Problem Relation Age of Onset  . Cancer Mother         Breast Cancer, Uterine Cancer  . Breast cancer Mother   . Uterine cancer Mother   . Heart disease Mother   . Heart attack Mother   . Prostate cancer Father   . Hypertension Brother   . Stomach cancer Paternal Grandmother   . Thyroid disease Neg Hx   . Colon cancer Neg Hx   . Esophageal cancer Neg Hx   . Rectal  cancer Neg Hx   . Pancreatic cancer Neg Hx     BP 118/80 (BP Location: Left Arm, Patient Position: Sitting, Cuff Size: Normal)   Pulse 72   Wt 207 lb 12.8 oz (94.3 kg)   SpO2 95%   BMI 28.98 kg/m   Review of Systems He has diffuse arthralgias, doe, and numbness of all 4's.  He denies hypoglycemia.     Objective:   Physical Exam VITAL SIGNS:  See vs page GENERAL: no distress LUNGS:  Clear to auscultation. Pulses: dorsalis pedis intact bilat.   MSK: no deformity of the feet CV: no leg edema Skin:  no ulcer on the feet.  normal color and temp on the feet. Neuro: sensation is intact to touch on the feet   Lab Results  Component Value Date   HGBA1C 7.4 10/03/2017       Assessment & Plan:  Type 2 DM: Please continue the same medication for now.  I hesitate to change  Fatigue, new, uncertain etiology Arthralgias: check labs Doe: check CXR  Patient Instructions  blood tests, and a chest x-ray, are requested for you today.  We'll let you know about the results.   Please come back for a follow-up appointment in 2 days.

## 2017-10-03 NOTE — Patient Instructions (Signed)
blood tests, and a chest x-ray, are requested for you today.  We'll let you know about the results.   Please come back for a follow-up appointment in 2 days.

## 2017-10-04 LAB — VITAMIN D 25 HYDROXY (VIT D DEFICIENCY, FRACTURES): VITD: 24.43 ng/mL — ABNORMAL LOW (ref 30.00–100.00)

## 2017-10-04 LAB — BRAIN NATRIURETIC PEPTIDE: PRO B NATRI PEPTIDE: 36 pg/mL (ref 0.0–100.0)

## 2017-10-04 LAB — CBC WITH DIFFERENTIAL/PLATELET
BASOS ABS: 0.1 10*3/uL (ref 0.0–0.1)
BASOS PCT: 1.5 % (ref 0.0–3.0)
EOS ABS: 0.2 10*3/uL (ref 0.0–0.7)
Eosinophils Relative: 2.9 % (ref 0.0–5.0)
HCT: 46.6 % (ref 39.0–52.0)
Hemoglobin: 16.1 g/dL (ref 13.0–17.0)
Lymphocytes Relative: 30.7 % (ref 12.0–46.0)
Lymphs Abs: 1.6 10*3/uL (ref 0.7–4.0)
MCHC: 34.5 g/dL (ref 30.0–36.0)
MCV: 97.9 fl (ref 78.0–100.0)
MONO ABS: 0.6 10*3/uL (ref 0.1–1.0)
Monocytes Relative: 11.1 % (ref 3.0–12.0)
NEUTROS ABS: 2.9 10*3/uL (ref 1.4–7.7)
Neutrophils Relative %: 53.8 % (ref 43.0–77.0)
PLATELETS: 205 10*3/uL (ref 150.0–400.0)
RBC: 4.76 Mil/uL (ref 4.22–5.81)
RDW: 12.5 % (ref 11.5–15.5)
WBC: 5.4 10*3/uL (ref 4.0–10.5)

## 2017-10-04 LAB — IBC PANEL
IRON: 198 ug/dL — AB (ref 42–165)
Saturation Ratios: 57.3 % — ABNORMAL HIGH (ref 20.0–50.0)
TRANSFERRIN: 247 mg/dL (ref 212.0–360.0)

## 2017-10-04 LAB — BASIC METABOLIC PANEL
BUN: 20 mg/dL (ref 6–23)
CO2: 22 mEq/L (ref 19–32)
Calcium: 9.4 mg/dL (ref 8.4–10.5)
Chloride: 103 mEq/L (ref 96–112)
Creatinine, Ser: 1.37 mg/dL (ref 0.40–1.50)
GFR: 55.34 mL/min — AB (ref >60.00)
Glucose, Bld: 144 mg/dL — ABNORMAL HIGH (ref 70–99)
Potassium: 4.3 mEq/L (ref 3.5–5.1)
SODIUM: 137 meq/L (ref 135–145)

## 2017-10-04 LAB — TSH: TSH: 3.16 u[IU]/mL (ref 0.35–4.50)

## 2017-10-04 LAB — VITAMIN B12: VITAMIN B 12: 385 pg/mL (ref 211–911)

## 2017-10-04 LAB — PTH, INTACT AND CALCIUM
Calcium: 9.6 mg/dL (ref 8.6–10.3)
PTH: 61 pg/mL (ref 14–64)

## 2017-10-04 LAB — SEDIMENTATION RATE: Sed Rate: 14 mm/hr (ref 0–20)

## 2017-10-05 ENCOUNTER — Ambulatory Visit (INDEPENDENT_AMBULATORY_CARE_PROVIDER_SITE_OTHER): Payer: PPO | Admitting: Endocrinology

## 2017-10-05 ENCOUNTER — Encounter: Payer: Self-pay | Admitting: Endocrinology

## 2017-10-05 ENCOUNTER — Ambulatory Visit
Admission: RE | Admit: 2017-10-05 | Discharge: 2017-10-05 | Disposition: A | Discharge status: Home / Self Care | Payer: PPO | Source: Ambulatory Visit | Attending: Endocrinology | Admitting: Endocrinology

## 2017-10-05 DIAGNOSIS — R0600 Dyspnea, unspecified: Secondary | ICD-10-CM

## 2017-10-05 DIAGNOSIS — M2550 Pain in unspecified joint: Secondary | ICD-10-CM | POA: Diagnosis not present

## 2017-10-05 DIAGNOSIS — J9811 Atelectasis: Secondary | ICD-10-CM | POA: Diagnosis not present

## 2017-10-05 NOTE — Progress Notes (Addendum)
Subjective:    Patient ID: Jeff Lopez, male    DOB: August 07, 1950, 67 y.o.   MRN: [id number]  HPI Pt states 1 year of slight doe sensation in the chest, but no assoc pain.   Past Medical History:  Diagnosis Date  . CAD, NATIVE VESSEL 04/19/2010   nonobstructive by cath 2007:  oLAD 20-30%, mLAD 50%, pCFX 20-30%, oAVCFX 20-30%, L renal art 50%;  normal LVF  . Cataract    bil cataracts removed  . COPD (chronic obstructive pulmonary disease) ([place])   . Diabetes mellitus without complication ([place])   . GERD 04/09/2007  . Headache(784.0)   . HYPERLIPIDEMIA 04/09/2007   Patient denies.  [name] HYPERTENSION, BENIGN 04/19/2010  . Hypothyroidism   . Hypothyroidism    previous hyperthyroidism, s/p I-131  . LUMBAR DISC DISORDER 05/27/2010  . Nerve damage    Left leg  . OSTEOARTHRITIS, LUMBAR SPINE 04/09/2007  . RENAL CYST 05/27/2010  . Spinal headache    After having back surgery in 2014    Past Surgical History:  Procedure Laterality Date  . BACK SURGERY  2012,2014  . CARDIAC CATHETERIZATION  2007   Dr Loanne Drilling  . CHOLECYSTECTOMY    . CHOLECYSTECTOMY N/A 07/13/2013   Procedure: LAPAROSCOPIC CHOLECYSTECTOMY WITH INTRAOPERATIVE CHOLANGIOGRAM;  Surgeon: Shann Medal, MD;  [facility];  Service: General;  Laterality: N/A;  . COLONOSCOPY    . COLONOSCOPY W/ POLYPECTOMY    . ERCP N/A 07/14/2013   Procedure: ENDOSCOPIC RETROGRADE CHOLANGIOPANCREATOGRAPHY (ERCP);  Surgeon: [name], MD;  Location: [facility];  Service: Endoscopy;  Laterality: N/A;  . EYE SURGERY Bilateral    Cataract removal  . FOOT FRACTURE SURGERY    . FRACTURE SURGERY    . LUMBAR DISC SURGERY  08/06/2012   L3 & L4  . LUMBAR DISC SURGERY  11/11/2015   L1 L2    DR NITKA  . LUMBAR FUSION N/A 04/20/2015   Procedure: T12 to L1 fusion (Extension of Previous Fusion L2-S1 to T12-S1), Right Transforaminal lumbar interbody fusion, Posterior Fusion T12 to L1, with Pedicle screws, allograft, local bone graft, and   Vivigen;  Surgeon: [name], MD;  Location: [facility];  Service: Orthopedics;  Laterality: N/A;  . LUMBAR LAMINECTOMY N/A 11/10/2013   Procedure: Left L1-2 far lateral approach to excise herniated nucleus pulposus;  Surgeon: [name], MD;  Location: [facility];  Service: Orthopedics;  Laterality: N/A;  . LUMBAR LAMINECTOMY/DECOMPRESSION MICRODISCECTOMY Left 08/10/2012   Procedure: Dura Repair Left Side L2-L3;  Surgeon: [name], MD;  Location: [facility];  Service: Orthopedics;  Laterality: Left;  Wilson Frame, Sliding table, dura repair kit, microscope  . NECK SURGERY     X 2  . SPINE SURGERY  2006   C-spine surgery x 2  . UPPER GASTROINTESTINAL ENDOSCOPY      Social History   Socioeconomic History  . Marital status: Single    Spouse name: Not on file  . Number of children: 1  . Years of education: Not on file  . Highest education level: Not on file  Occupational History  . Occupation: DISTRIBUTION MGR    Employer: [employer]  Social Needs  . Financial resource strain: Not on file  . Food insecurity:    Worry: Not on file    Inability: Not on file  . Transportation needs:    Medical: Not on file    Non-medical: Not on file  Tobacco Use  . Smoking status: Former  Smoker    Packs/day: 0.50    Years: 44.00    Pack years: 22.00    Types: Cigarettes    Last attempt to quit: 04/07/2007    Years since quitting: 10.5  . Smokeless tobacco: Never Used  . Tobacco comment: pt has stopped smoking about 9 months now  Substance and Sexual Activity  . Alcohol use: No  . Drug use: No  . Sexual activity: Not Currently  Lifestyle  . Physical activity:    Days per week: Not on file    Minutes per session: Not on file  . Stress: Not on file  Relationships  . Social connections:    Talks on phone: Not on file    Gets together: Not on file    Attends religious service: Not on file    Active member of club or organization: Not on file    Attends meetings of clubs or  organizations: Not on file    Relationship status: Not on file  . Intimate partner violence:    Fear of current or ex partner: Not on file    Emotionally abused: Not on file    Physically abused: Not on file    Forced sexual activity: Not on file  Other Topics Concern  . Not on file  Social History Narrative   Works in Psychologist, educational. Retired.   Lives alone, has one child in [place].     Current Outpatient Medications on File Prior to Visit  Medication Sig Dispense Refill  . gabapentin (NEURONTIN) 300 MG capsule Take 1 capsule (300 mg total) by mouth 3 (three) times daily. 90 capsule 3  . irbesartan (AVAPRO) 150 MG tablet Take 2 tablets (300 mg total) by mouth daily. 180 tablet 1  . levothyroxine (SYNTHROID, LEVOTHROID) 125 MCG tablet TAKE 1 TABLET BY MOUTH EVERY DAY BEFORE BREAKFAST 90 tablet 0  . metFORMIN (GLUCOPHAGE-XR) 500 MG 24 hr tablet TAKE 1 TABLET BY MOUTH ONCE DAILY WITH BREAKFAST 90 tablet 0  . metoprolol succinate (TOPROL-XL) 50 MG 24 hr tablet Take 1 tablet (50 mg total) by mouth daily. 90 tablet 3  . Multiple Vitamin (MULTIVITAMIN) tablet Take 1 tablet by mouth daily.    . pantoprazole (PROTONIX) 40 MG tablet take 1 tablet by mouth twice a day before MEALS. 60 tablet 11  . spironolactone (ALDACTONE) 50 MG tablet Take 1 tablet (50 mg total) by mouth daily. 90 tablet 1   No current facility-administered medications on file prior to visit.     Allergies  Allergen Reactions  . Ceftin Other (See Comments)    Patient stated it caused "sores in mouth" Thrush  . Cefuroxime Axetil Other (See Comments)    Sores in mouth    Family History  Problem Relation Age of Onset  . Cancer Mother        Breast Cancer, Uterine Cancer  . Breast cancer Mother   . Uterine cancer Mother   . Heart disease Mother   . Heart attack Mother   . Prostate cancer Father   . Hypertension Brother   . Stomach cancer Paternal Grandmother   . Thyroid disease Neg Hx   . Colon cancer Neg Hx     . Esophageal cancer Neg Hx   . Rectal cancer Neg Hx   . Pancreatic cancer Neg Hx     BP 118/80 (BP Location: Left Arm, Patient Position: Sitting, Cuff Size: Normal)   Pulse 60   Wt 210 lb 12.8 oz (95.6 kg)   SpO2  94%   BMI 29.40 kg/m    Review of Systems Fatigue and weight gain persist.    Objective:   Physical Exam VITAL SIGNS:  See vs page GENERAL: no distress LUNGS:  Clear to auscultation.   Lab Results  Component Value Date   IRON 198 (H) 10/03/2017   FERRITIN 309 04/30/2014       Assessment & Plan:  Vit-D deficiency, new. Doe, persistent. Hemochromatosis: he needs to go for f/u.  Check fe panel today  Patient Instructions  Take vitamin-D, 1000 units/day.  Please go back to see 2 specialists.  you will receive a phone call, about days and times for an appointments.  A chest x-ray requested for you today.  We'll let you know about the results.

## 2017-10-05 NOTE — Patient Instructions (Addendum)
Take vitamin-D, 1000 units/day.  Please go back to see 2 specialists.  you will receive a phone call, about days and times for an appointments.  A chest x-ray requested for you today.  We'll let you know about the results.

## 2017-10-06 ENCOUNTER — Other Ambulatory Visit: Payer: Self-pay | Admitting: Gastroenterology

## 2017-10-09 ENCOUNTER — Telehealth: Payer: Self-pay | Admitting: Endocrinology

## 2017-10-09 NOTE — Telephone Encounter (Signed)
Patient stated he is returning a call from the office.

## 2017-10-10 NOTE — Telephone Encounter (Signed)
I called and read results to patient.

## 2017-10-14 NOTE — Addendum Note (Signed)
Addended by: Renato Shin on: 10/14/2017 03:21 PM   Modules accepted: Orders

## 2017-10-19 ENCOUNTER — Other Ambulatory Visit: Payer: Self-pay | Admitting: Endocrinology

## 2017-10-25 ENCOUNTER — Ambulatory Visit: Payer: PPO | Admitting: Podiatry

## 2017-10-25 ENCOUNTER — Encounter: Payer: Self-pay | Admitting: Podiatry

## 2017-10-25 DIAGNOSIS — M722 Plantar fascial fibromatosis: Secondary | ICD-10-CM

## 2017-10-25 MED ORDER — TRIAMCINOLONE ACETONIDE 10 MG/ML IJ SUSP
10.0000 mg | Freq: Once | INTRAMUSCULAR | Status: AC
  Administered 2017-10-25: 10 mg

## 2017-10-25 NOTE — Progress Notes (Signed)
Subjective:   Patient ID: Jeff Lopez, male   DOB: 67 y.o.   MRN: [id number]   HPI Patient states the mid arch area left has been bothering him and he like to try injections and see if that will help   ROS      Objective:  Physical Exam  Orthotics are helping him but he does have inflammation of the mid arch left     Assessment:  Fasciitis left with mid arch pain     Plan:  Continue orthotics continue physical therapy and went ahead and injected the mid arch area left 3 mg Kenalog 5 mg Xylocaine and advised on heat therapy and we will see back if symptoms persist

## 2017-10-26 ENCOUNTER — Other Ambulatory Visit: Payer: Self-pay | Admitting: Podiatry

## 2017-10-30 ENCOUNTER — Telehealth: Payer: Self-pay | Admitting: Endocrinology

## 2017-10-30 NOTE — Telephone Encounter (Signed)
Patient has appointment tomorrow 9:45.

## 2017-10-30 NOTE — Telephone Encounter (Signed)
Please advise ov tomorrow 

## 2017-10-30 NOTE — Telephone Encounter (Signed)
Patient is fighting a cold and would like to know if there is some kind of medication that can be called in for him.   Please advise

## 2017-10-31 ENCOUNTER — Ambulatory Visit (INDEPENDENT_AMBULATORY_CARE_PROVIDER_SITE_OTHER): Payer: PPO | Admitting: Endocrinology

## 2017-10-31 ENCOUNTER — Encounter: Payer: Self-pay | Admitting: Endocrinology

## 2017-10-31 VITALS — BP 142/82 | HR 78 | Temp 98.9°F | Wt 205.0 lb

## 2017-10-31 DIAGNOSIS — R05 Cough: Secondary | ICD-10-CM

## 2017-10-31 DIAGNOSIS — R059 Cough, unspecified: Secondary | ICD-10-CM

## 2017-10-31 MED ORDER — AZITHROMYCIN 500 MG PO TABS: 500.0000 mg | ORAL_TABLET | Freq: Every day | ORAL | 0 refills | Status: DC

## 2017-10-31 MED ORDER — PROMETHAZINE-CODEINE 6.25-10 MG/5ML PO SYRP: 5.0000 mL | ORAL_SOLUTION | ORAL | 0 refills | Status: DC | PRN

## 2017-10-31 MED ORDER — FLUTICASONE-SALMETEROL 115-21 MCG/ACT IN AERO: 2.0000 | INHALATION_SPRAY | Freq: Two times a day (BID) | RESPIRATORY_TRACT | 1 refills | Status: DC

## 2017-10-31 NOTE — Patient Instructions (Signed)
I have sent 3 prescriptions to your pharmacy. A chest x-ray is requested for you today.  We'll let you know about the results. I hope you feel better soon.  If you don't feel better by next week, please call back.  Please call sooner if you get worse.

## 2017-10-31 NOTE — Progress Notes (Signed)
Subjective:    Patient ID: Jeff Lopez, male    DOB: 29-Oct-1950, 67 y.o.   MRN: [id number]  HPI 2 days of moderate prod-quality cough in the chest, and assoc pain.  He also has nasal congestion.   Past Medical History:  Diagnosis Date  . CAD, NATIVE VESSEL 04/19/2010   nonobstructive by cath 2007:  oLAD 20-30%, mLAD 50%, pCFX 20-30%, oAVCFX 20-30%, L renal art 50%;  normal LVF  . Cataract    bil cataracts removed  . COPD (chronic obstructive pulmonary disease) ([place])   . Diabetes mellitus without complication ([place])   . GERD 04/09/2007  . Headache(784.0)   . HYPERLIPIDEMIA 04/09/2007   Patient denies.  [name] HYPERTENSION, BENIGN 04/19/2010  . Hypothyroidism   . Hypothyroidism    previous hyperthyroidism, s/p I-131  . LUMBAR DISC DISORDER 05/27/2010  . Nerve damage    Left leg  . OSTEOARTHRITIS, LUMBAR SPINE 04/09/2007  . RENAL CYST 05/27/2010  . Spinal headache    After having back surgery in 2014    Past Surgical History:  Procedure Laterality Date  . BACK SURGERY  2012,2014  . CARDIAC CATHETERIZATION  2007   Dr Loanne Drilling  . CHOLECYSTECTOMY    . CHOLECYSTECTOMY N/A 07/13/2013   Procedure: LAPAROSCOPIC CHOLECYSTECTOMY WITH INTRAOPERATIVE CHOLANGIOGRAM;  Surgeon: Shann Medal, MD;  [facility];  Service: General;  Laterality: N/A;  . COLONOSCOPY    . COLONOSCOPY W/ POLYPECTOMY    . ERCP N/A 07/14/2013   Procedure: ENDOSCOPIC RETROGRADE CHOLANGIOPANCREATOGRAPHY (ERCP);  Surgeon: [name], MD;  Location: [facility];  Service: Endoscopy;  Laterality: N/A;  . EYE SURGERY Bilateral    Cataract removal  . FOOT FRACTURE SURGERY    . FRACTURE SURGERY    . LUMBAR DISC SURGERY  08/06/2012   L3 & L4  . LUMBAR DISC SURGERY  11/11/2015   L1 L2    DR NITKA  . LUMBAR FUSION N/A 04/20/2015   Procedure: T12 to L1 fusion (Extension of Previous Fusion L2-S1 to T12-S1), Right Transforaminal lumbar interbody fusion, Posterior Fusion T12 to L1, with Pedicle screws, allograft,  local bone graft, and  Vivigen;  Surgeon: [name], MD;  Location: [facility];  Service: Orthopedics;  Laterality: N/A;  . LUMBAR LAMINECTOMY N/A 11/10/2013   Procedure: Left L1-2 far lateral approach to excise herniated nucleus pulposus;  Surgeon: [name], MD;  Location: [facility];  Service: Orthopedics;  Laterality: N/A;  . LUMBAR LAMINECTOMY/DECOMPRESSION MICRODISCECTOMY Left 08/10/2012   Procedure: Dura Repair Left Side L2-L3;  Surgeon: [name], MD;  Location: [facility];  Service: Orthopedics;  Laterality: Left;  Wilson Frame, Sliding table, dura repair kit, microscope  . NECK SURGERY     X 2  . SPINE SURGERY  2006   C-spine surgery x 2  . UPPER GASTROINTESTINAL ENDOSCOPY      Social History   Socioeconomic History  . Marital status: Single    Spouse name: Not on file  . Number of children: 1  . Years of education: Not on file  . Highest education level: Not on file  Occupational History  . Occupation: DISTRIBUTION MGR    Employer: [employer]  Social Needs  . Financial resource strain: Not on file  . Food insecurity:    Worry: Not on file    Inability: Not on file  . Transportation needs:    Medical: Not on file    Non-medical: Not on file  Tobacco Use  .  Smoking status: Former Smoker    Packs/day: 0.50    Years: 44.00    Pack years: 22.00    Types: Cigarettes    Last attempt to quit: 04/07/2007    Years since quitting: 10.5  . Smokeless tobacco: Never Used  . Tobacco comment: pt has stopped smoking about 9 months now  Substance and Sexual Activity  . Alcohol use: No  . Drug use: No  . Sexual activity: Not Currently  Lifestyle  . Physical activity:    Days per week: Not on file    Minutes per session: Not on file  . Stress: Not on file  Relationships  . Social connections:    Talks on phone: Not on file    Gets together: Not on file    Attends religious service: Not on file    Active member of club or organization: Not on file    Attends  meetings of clubs or organizations: Not on file    Relationship status: Not on file  . Intimate partner violence:    Fear of current or ex partner: Not on file    Emotionally abused: Not on file    Physically abused: Not on file    Forced sexual activity: Not on file  Other Topics Concern  . Not on file  Social History Narrative   Works in Psychologist, educational. Retired.   Lives alone, has one child in [place].     Current Outpatient Medications on File Prior to Visit  Medication Sig Dispense Refill  . gabapentin (NEURONTIN) 300 MG capsule TAKE ONE CAPSULE BY MOUTH THREE TIMES DAILY 90 capsule 11  . irbesartan (AVAPRO) 150 MG tablet Take 2 tablets (300 mg total) by mouth daily. 180 tablet 1  . levothyroxine (SYNTHROID, LEVOTHROID) 125 MCG tablet TAKE 1 TABLET BY MOUTH EVERY DAY BEFORE BREAKFAST 90 tablet 0  . metFORMIN (GLUCOPHAGE-XR) 500 MG 24 hr tablet TAKE 1 TABLET BY MOUTH ONCE DAILY WITH BREAKFAST 90 tablet 0  . metoprolol succinate (TOPROL-XL) 50 MG 24 hr tablet Take 1 tablet (50 mg total) by mouth daily. 90 tablet 3  . Multiple Vitamin (MULTIVITAMIN) tablet Take 1 tablet by mouth daily.    . pantoprazole (PROTONIX) 40 MG tablet TAKE 1 TABLET BY MOUTH TWICE A DAY BEFORE MEALS 60 tablet 0  . spironolactone (ALDACTONE) 50 MG tablet Take 1 tablet (50 mg total) by mouth daily. 90 tablet 1   No current facility-administered medications on file prior to visit.     Allergies  Allergen Reactions  . Ceftin Other (See Comments)    Patient stated it caused "sores in mouth" Thrush  . Cefuroxime Axetil Other (See Comments)    Sores in mouth    Family History  Problem Relation Age of Onset  . Cancer Mother        Breast Cancer, Uterine Cancer  . Breast cancer Mother   . Uterine cancer Mother   . Heart disease Mother   . Heart attack Mother   . Prostate cancer Father   . Hypertension Brother   . Stomach cancer Paternal Grandmother   . Thyroid disease Neg Hx   . Colon cancer Neg Hx     . Esophageal cancer Neg Hx   . Rectal cancer Neg Hx   . Pancreatic cancer Neg Hx     BP (!) 142/82   Pulse 78   Temp 98.9 F (37.2 C)   Wt 205 lb (93 kg)   SpO2 97%   BMI 28.59  kg/m    Review of Systems Denies fever and sob.      Objective:   Physical Exam VITAL SIGNS:  See vs page GENERAL: no distress head: no deformity  eyes: no periorbital swelling, no proptosis  external nose and ears are normal  mouth: no lesion seen Both eac's and tm's are normal LUNGS:  Clear to auscultation.      Assessment & Plan:  Cough, new, uncertain etiology.  HTN: prob worse due to acute illness.   Patient Instructions  I have sent 3 prescriptions to your pharmacy. A chest x-ray is requested for you today.  We'll let you know about the results. I hope you feel better soon.  If you don't feel better by next week, please call back.  Please call sooner if you get worse.

## 2017-11-05 ENCOUNTER — Other Ambulatory Visit: Payer: Self-pay | Admitting: Gastroenterology

## 2017-11-12 ENCOUNTER — Other Ambulatory Visit: Payer: Self-pay | Admitting: Endocrinology

## 2017-12-02 ENCOUNTER — Other Ambulatory Visit: Payer: Self-pay | Admitting: Gastroenterology

## 2017-12-24 DIAGNOSIS — L03011 Cellulitis of right finger: Secondary | ICD-10-CM | POA: Diagnosis not present

## 2017-12-24 DIAGNOSIS — L299 Pruritus, unspecified: Secondary | ICD-10-CM | POA: Diagnosis not present

## 2017-12-24 DIAGNOSIS — L219 Seborrheic dermatitis, unspecified: Secondary | ICD-10-CM | POA: Diagnosis not present

## 2017-12-26 ENCOUNTER — Ambulatory Visit (INDEPENDENT_AMBULATORY_CARE_PROVIDER_SITE_OTHER): Payer: PPO | Admitting: Endocrinology

## 2017-12-26 ENCOUNTER — Encounter: Payer: Self-pay | Admitting: Endocrinology

## 2017-12-26 VITALS — BP 150/82 | HR 77 | Wt 200.0 lb

## 2017-12-26 DIAGNOSIS — E119 Type 2 diabetes mellitus without complications: Secondary | ICD-10-CM | POA: Diagnosis not present

## 2017-12-26 DIAGNOSIS — R1032 Left lower quadrant pain: Secondary | ICD-10-CM | POA: Insufficient documentation

## 2017-12-26 DIAGNOSIS — I25119 Atherosclerotic heart disease of native coronary artery with unspecified angina pectoris: Secondary | ICD-10-CM

## 2017-12-26 LAB — CBC WITH DIFFERENTIAL/PLATELET
BASOS ABS: 0 10*3/uL (ref 0.0–0.1)
BASOS PCT: 0.8 % (ref 0.0–3.0)
EOS ABS: 0.1 10*3/uL (ref 0.0–0.7)
Eosinophils Relative: 2.7 % (ref 0.0–5.0)
HEMATOCRIT: 43.5 % (ref 39.0–52.0)
Hemoglobin: 14.9 g/dL (ref 13.0–17.0)
LYMPHS PCT: 27.5 % (ref 12.0–46.0)
Lymphs Abs: 1.5 10*3/uL (ref 0.7–4.0)
MCHC: 34.4 g/dL (ref 30.0–36.0)
MCV: 97.9 fl (ref 78.0–100.0)
MONOS PCT: 9.3 % (ref 3.0–12.0)
Monocytes Absolute: 0.5 10*3/uL (ref 0.1–1.0)
Neutro Abs: 3.2 10*3/uL (ref 1.4–7.7)
Neutrophils Relative %: 59.7 % (ref 43.0–77.0)
Platelets: 180 10*3/uL (ref 150.0–400.0)
RBC: 4.44 Mil/uL (ref 4.22–5.81)
RDW: 12 % (ref 11.5–15.5)
WBC: 5.3 10*3/uL (ref 4.0–10.5)

## 2017-12-26 LAB — URINALYSIS, ROUTINE W REFLEX MICROSCOPIC
BILIRUBIN URINE: NEGATIVE
Hgb urine dipstick: NEGATIVE
KETONES UR: NEGATIVE
Leukocytes, UA: NEGATIVE
NITRITE: NEGATIVE
PH: 6 (ref 5.0–8.0)
RBC / HPF: NONE SEEN (ref 0–?)
SPECIFIC GRAVITY, URINE: 1.02 (ref 1.000–1.030)
Total Protein, Urine: NEGATIVE
Urine Glucose: NEGATIVE
Urobilinogen, UA: 0.2 (ref 0.0–1.0)

## 2017-12-26 LAB — BASIC METABOLIC PANEL
BUN: 23 mg/dL (ref 6–23)
CHLORIDE: 104 meq/L (ref 96–112)
CO2: 23 meq/L (ref 19–32)
CREATININE: 1.37 mg/dL (ref 0.40–1.50)
Calcium: 9.2 mg/dL (ref 8.4–10.5)
GFR: 55.3 mL/min — ABNORMAL LOW (ref >60.00)
Glucose, Bld: 155 mg/dL — ABNORMAL HIGH (ref 70–99)
POTASSIUM: 4 meq/L (ref 3.5–5.1)
Sodium: 139 mEq/L (ref 135–145)

## 2017-12-26 LAB — POCT GLYCOSYLATED HEMOGLOBIN (HGB A1C): HEMOGLOBIN A1C: 7.1 % — AB (ref 4.0–5.6)

## 2017-12-26 NOTE — Patient Instructions (Addendum)
Please come back for a regular physical appointment in 2 months.  blood tests and an ultrasound, are requested for you today.  We'll let you know about the results. Please finish taking the antibiotics.

## 2017-12-26 NOTE — Progress Notes (Signed)
Subjective:    Patient ID: Jeff Lopez, male    DOB: Dec 26, 1950, 67 y.o.   MRN: [id number]  HPI 1 month of intermitt moderate LLQ pain, worse with walking.  He has slight assoc diarrhea.  Yesterday, he started antibiotic for finger paronychia, but does not recall the name Past Medical History:  Diagnosis Date  . CAD, NATIVE VESSEL 04/19/2010   nonobstructive by cath 2007:  oLAD 20-30%, mLAD 50%, pCFX 20-30%, oAVCFX 20-30%, L renal art 50%;  normal LVF  . Cataract    bil cataracts removed  . COPD (chronic obstructive pulmonary disease) ([place])   . Diabetes mellitus without complication ([place])   . GERD 04/09/2007  . Headache(784.0)   . HYPERLIPIDEMIA 04/09/2007   Patient denies.  [name] HYPERTENSION, BENIGN 04/19/2010  . Hypothyroidism   . Hypothyroidism    previous hyperthyroidism, s/p I-131  . LUMBAR DISC DISORDER 05/27/2010  . Nerve damage    Left leg  . OSTEOARTHRITIS, LUMBAR SPINE 04/09/2007  . RENAL CYST 05/27/2010  . Spinal headache    After having back surgery in 2014    Past Surgical History:  Procedure Laterality Date  . BACK SURGERY  2012,2014  . CARDIAC CATHETERIZATION  2007   Dr Loanne Drilling  . CHOLECYSTECTOMY    . CHOLECYSTECTOMY N/A 07/13/2013   Procedure: LAPAROSCOPIC CHOLECYSTECTOMY WITH INTRAOPERATIVE CHOLANGIOGRAM;  Surgeon: Shann Medal, MD;  [facility];  Service: General;  Laterality: N/A;  . COLONOSCOPY    . COLONOSCOPY W/ POLYPECTOMY    . ERCP N/A 07/14/2013   Procedure: ENDOSCOPIC RETROGRADE CHOLANGIOPANCREATOGRAPHY (ERCP);  Surgeon: [name], MD;  Location: [facility];  Service: Endoscopy;  Laterality: N/A;  . EYE SURGERY Bilateral    Cataract removal  . FOOT FRACTURE SURGERY    . FRACTURE SURGERY    . LUMBAR DISC SURGERY  08/06/2012   L3 & L4  . LUMBAR DISC SURGERY  11/11/2015   L1 L2    DR NITKA  . LUMBAR FUSION N/A 04/20/2015   Procedure: T12 to L1 fusion (Extension of Previous Fusion L2-S1 to T12-S1), Right Transforaminal lumbar  interbody fusion, Posterior Fusion T12 to L1, with Pedicle screws, allograft, local bone graft, and  Vivigen;  Surgeon: [name], MD;  Location: [facility];  Service: Orthopedics;  Laterality: N/A;  . LUMBAR LAMINECTOMY N/A 11/10/2013   Procedure: Left L1-2 far lateral approach to excise herniated nucleus pulposus;  Surgeon: [name], MD;  Location: [facility];  Service: Orthopedics;  Laterality: N/A;  . LUMBAR LAMINECTOMY/DECOMPRESSION MICRODISCECTOMY Left 08/10/2012   Procedure: Dura Repair Left Side L2-L3;  Surgeon: [name], MD;  Location: [facility];  Service: Orthopedics;  Laterality: Left;  Wilson Frame, Sliding table, dura repair kit, microscope  . NECK SURGERY     X 2  . SPINE SURGERY  2006   C-spine surgery x 2  . UPPER GASTROINTESTINAL ENDOSCOPY      Social History   Socioeconomic History  . Marital status: Single    Spouse name: Not on file  . Number of children: 1  . Years of education: Not on file  . Highest education level: Not on file  Occupational History  . Occupation: DISTRIBUTION MGR    Employer: [employer]  Social Needs  . Financial resource strain: Not on file  . Food insecurity:    Worry: Not on file    Inability: Not on file  . Transportation needs:    Medical: Not on file  Non-medical: Not on file  Tobacco Use  . Smoking status: Former Smoker    Packs/day: 0.50    Years: 44.00    Pack years: 22.00    Types: Cigarettes    Last attempt to quit: 04/07/2007    Years since quitting: 10.7  . Smokeless tobacco: Never Used  . Tobacco comment: pt has stopped smoking about 9 months now  Substance and Sexual Activity  . Alcohol use: No  . Drug use: No  . Sexual activity: Not Currently  Lifestyle  . Physical activity:    Days per week: Not on file    Minutes per session: Not on file  . Stress: Not on file  Relationships  . Social connections:    Talks on phone: Not on file    Gets together: Not on file    Attends religious service: Not on  file    Active member of club or organization: Not on file    Attends meetings of clubs or organizations: Not on file    Relationship status: Not on file  . Intimate partner violence:    Fear of current or ex partner: Not on file    Emotionally abused: Not on file    Physically abused: Not on file    Forced sexual activity: Not on file  Other Topics Concern  . Not on file  Social History Narrative   Works in Psychologist, educational. Retired.   Lives alone, has one child in [place].     Current Outpatient Medications on File Prior to Visit  Medication Sig Dispense Refill  . irbesartan (AVAPRO) 150 MG tablet Take 2 tablets (300 mg total) by mouth daily. 180 tablet 1  . levothyroxine (SYNTHROID, LEVOTHROID) 125 MCG tablet TAKE 1 TABLET BY MOUTH EVERY DAY BEFORE BREAKFAST. 90 tablet 0  . metFORMIN (GLUCOPHAGE-XR) 500 MG 24 hr tablet TAKE 1 TABLET BY MOUTH ONCE DAILY WITH BREAKFAST 90 tablet 0  . metoprolol succinate (TOPROL-XL) 50 MG 24 hr tablet Take 1 tablet (50 mg total) by mouth daily. 90 tablet 3  . pantoprazole (PROTONIX) 40 MG tablet TAKE 1 TABLET BY MOUTH TWICE A DAY BEFORE MEALS 60 tablet 0  . spironolactone (ALDACTONE) 50 MG tablet Take 1 tablet (50 mg total) by mouth daily. 90 tablet 1  . fluticasone-salmeterol (ADVAIR HFA) 115-21 MCG/ACT inhaler Inhale 2 puffs into the lungs 2 (two) times daily. (Patient not taking: Reported on 12/26/2017) 1 Inhaler 1  . gabapentin (NEURONTIN) 300 MG capsule TAKE ONE CAPSULE BY MOUTH THREE TIMES DAILY (Patient not taking: Reported on 12/26/2017) 90 capsule 11  . Multiple Vitamin (MULTIVITAMIN) tablet Take 1 tablet by mouth daily.     No current facility-administered medications on file prior to visit.     Allergies  Allergen Reactions  . Ceftin Other (See Comments)    Patient stated it caused "sores in mouth" Thrush  . Cefuroxime Axetil Other (See Comments)    Sores in mouth    Family History  Problem Relation Age of Onset  . Cancer Mother         Breast Cancer, Uterine Cancer  . Breast cancer Mother   . Uterine cancer Mother   . Heart disease Mother   . Heart attack Mother   . Prostate cancer Father   . Hypertension Brother   . Stomach cancer Paternal Grandmother   . Thyroid disease Neg Hx   . Colon cancer Neg Hx   . Esophageal cancer Neg Hx   . Rectal cancer Neg  Hx   . Pancreatic cancer Neg Hx     BP (!) 150/82 (BP Location: Left Arm, Patient Position: Sitting, Cuff Size: Normal)   Pulse 77   Wt 200 lb (90.7 kg)   SpO2 97%   BMI 27.89 kg/m    Review of Systems Denies hematuria/n/v/fever/rash    Objective:   Physical Exam VITAL SIGNS:  See vs page GENERAL: no distress ABDOMEN: abdomen is soft.  Slight tenderness at the LLQ.  no hepatosplenomegaly.  not distended.  no hernia   Lab Results  Component Value Date   HGBA1C 7.1 (A) 12/26/2017      Assessment & Plan:  abd pain, new, uncertain etiology HTN: prob situational: recheck next time Paronychia: this antibiotic will likely cover diverticulitis.   Type 2 DM: worse: he declines to add another med.   Patient Instructions  Please come back for a regular physical appointment in 2 months.  blood tests and an ultrasound, are requested for you today.  We'll let you know about the results. Please finish taking the antibiotics.

## 2018-01-03 ENCOUNTER — Telehealth: Payer: Self-pay

## 2018-01-03 DIAGNOSIS — R109 Unspecified abdominal pain: Secondary | ICD-10-CM

## 2018-01-03 DIAGNOSIS — R1032 Left lower quadrant pain: Secondary | ICD-10-CM

## 2018-01-03 NOTE — Addendum Note (Signed)
Addended by: Renato Shin on: 01/03/2018 04:14 PM   Modules accepted: Orders

## 2018-01-03 NOTE — Telephone Encounter (Signed)
done

## 2018-01-03 NOTE — Telephone Encounter (Signed)
Jeff Lopez from [facility] called today to make MD aware that the order he placed for abdominal pain needs to be worded differently- Jeff Lopez stated they can not view lower L quadrant order needs to say abdominal- need orders ASAP appointment is for tomorrow - best call back number is [phone]

## 2018-01-04 ENCOUNTER — Other Ambulatory Visit: Payer: PPO

## 2018-01-09 ENCOUNTER — Other Ambulatory Visit: Payer: Self-pay | Admitting: Endocrinology

## 2018-01-09 DIAGNOSIS — R1032 Left lower quadrant pain: Secondary | ICD-10-CM

## 2018-01-16 ENCOUNTER — Ambulatory Visit (INDEPENDENT_AMBULATORY_CARE_PROVIDER_SITE_OTHER): Payer: PPO

## 2018-01-16 ENCOUNTER — Encounter (INDEPENDENT_AMBULATORY_CARE_PROVIDER_SITE_OTHER): Payer: Self-pay | Admitting: Specialist

## 2018-01-16 ENCOUNTER — Ambulatory Visit (INDEPENDENT_AMBULATORY_CARE_PROVIDER_SITE_OTHER): Payer: PPO | Admitting: Specialist

## 2018-01-16 VITALS — BP 144/66 | HR 83 | Ht 71.0 in | Wt 200.0 lb

## 2018-01-16 DIAGNOSIS — M545 Low back pain: Secondary | ICD-10-CM

## 2018-01-16 DIAGNOSIS — R1032 Left lower quadrant pain: Secondary | ICD-10-CM | POA: Diagnosis not present

## 2018-01-16 DIAGNOSIS — M25552 Pain in left hip: Secondary | ICD-10-CM

## 2018-01-16 NOTE — Progress Notes (Signed)
Office Visit Note   Patient: Jeff Lopez           Date of Birth: October 06, 1950           MRN: [id number] Visit Date: 01/16/2018              Requested by: Renato Shin, MD [address] [place], [place] [address] PCP: Renato Shin, MD   Assessment & Plan: Visit Diagnoses:  1. Pain of left hip joint   2. Low back pain, unspecified back pain laterality, unspecified chronicity, with sciatica presence unspecified     Plan: Avoid frequent bending and stooping  No lifting greater than 10 lbs. May use ice or moist heat for pain. Weight loss is of benefit. Handicap license is approved.  Follow-Up Instructions: No follow-ups on file.   Orders:  Orders Placed This Encounter  Procedures  . XR Lumbar Spine 2-3 Views  . XR HIP UNILAT W OR W/O PELVIS 2-3 VIEWS LEFT   No orders of the defined types were placed in this encounter.     Procedures: No procedures performed   Clinical Data: No additional findings.   Subjective: Chief Complaint  Patient presents with  . Lower Back - Follow-up  . Left Hip - Pain    67 year old male with history of lumbar degenerative disc disease s/p multiple level lumbar fusion. He is disabled and the back has been a problem but over the last month the pain in the Back is worsening and he has pain with lying on the left side and with trying to walk. He has difficulty bending, reaching his socks and with losing weight. He is having pain with sleep and difficulty Sleeping due to the back pain. No bowel and bladder difficulty. He has pain with walking.No weight loss, had a significant.   Review of Systems  Constitutional: Negative.   HENT: Negative.   Eyes: Negative.   Respiratory: Negative.   Cardiovascular: Negative.   Gastrointestinal: Negative.   Endocrine: Negative.   Genitourinary: Negative.   Musculoskeletal: Negative.   Skin: Negative.   Allergic/Immunologic: Negative.   Neurological: Negative.   Hematological:  Negative.   Psychiatric/Behavioral: Negative.      Objective: Vital Signs: BP (!) 144/66 (BP Location: Left Arm, Patient Position: Sitting)   Pulse 83   Ht _0  (1.803 m)   Wt 200 lb (90.7 kg)   BMI 27.89 kg/m   Physical Exam  Constitutional: He is oriented to person, place, and time. He appears well-developed and well-nourished.  HENT:  Head: Normocephalic and atraumatic.  Eyes: Pupils are equal, round, and reactive to light. EOM are normal.  Neck: Normal range of motion. Neck supple.  Pulmonary/Chest: Effort normal and breath sounds normal.  Abdominal: Soft. Bowel sounds are normal.  Neurological: He is alert and oriented to person, place, and time.  Skin: Skin is warm and dry.  Psychiatric: He has a normal mood and affect. His behavior is normal. Judgment and thought content normal.    Back Exam   Tenderness  The patient is experiencing tenderness in the lumbar.  Range of Motion  Extension: abnormal  Flexion: abnormal  Lateral bend right: normal  Lateral bend left: normal  Rotation right: normal  Rotation left: normal   Muscle Strength  Right Quadriceps:  5/5  Left Quadriceps:  5/5  Right Hamstrings:  5/5  Left Hamstrings:  5/5   Tests  Straight leg raise right: negative Straight leg raise left: negative  Reflexes  Patellar: normal Achilles: normal Babinski's sign: normal   Other  Toe walk: normal Heel walk: normal Sensation: normal Gait: normal  Erythema: no back redness Scars: absent  Comments:  Tight hamstrings      Specialty Comments:  No specialty comments available.  Imaging: No results found.   PMFS History: Patient Active Problem List   Diagnosis Date Noted  . Spinal stenosis, lumbar region, with neurogenic claudication 04/21/2015    Priority: High    Class: Chronic  . Spondylolisthesis of lumbar region 04/21/2015    Priority: High    Class: Chronic  . Postoperative CSF leak 08/10/2012    Priority: High    Class: Acute   . HNP (herniated nucleus pulposus), lumbar 08/06/2012    Priority: High    Class: Acute  . Left lower quadrant pain 12/26/2017  . Dyspnea 10/03/2017  . Arthralgia 10/03/2017  . Numbness 10/03/2017  . Diaphoresis 01/04/2017  . Chronic left sacroiliac pain 10/19/2016  . Chronic left-sided low back pain with left-sided sciatica 07/31/2016  . Hypokalemia 10/14/2015  . Hypocalcemia 10/14/2015  . Hypothyroidism following radioiodine therapy 03/10/2015  . Screening for prostate cancer 03/10/2015  . Hemochromatosis 03/27/2014  . Other abnormal blood chemistry 02/12/2014  . Choledocholithiasis 07/14/2013  . Nonspecific (abnormal) findings on radiological and other examination of biliary tract 07/13/2013  . Calculus of gallbladder with acute cholecystitis, without mention of obstruction 07/13/2013  . Calculus of bile duct without mention of cholecystitis or obstruction 07/13/2013  . Abdominal pain 07/12/2013  . Neck pain on left side 05/30/2013  . Quit smoking 11/28/2012  . Degenerative disc disease, lumbar 08/06/2012    Class: Chronic  . Diabetes ([place]) 06/10/2012  . COPD GOLD 0 02/09/2012  . Chest pain at rest 02/06/2012  . Allergic rhinitis 02/06/2012  . Wellness examination 08/12/2011  . Radiculopathy of leg 11/05/2010  . Cough 08/23/2010  . RENAL CYST 05/27/2010  . LUMBAR DISC DISORDER 05/27/2010  . HYPERTENSION, BENIGN 04/19/2010  . CAD, NATIVE VESSEL 04/19/2010  . HYPERLIPIDEMIA 04/09/2007  . GERD 04/09/2007  . OSTEOARTHRITIS, LUMBAR SPINE 04/09/2007   Past Medical History:  Diagnosis Date  . CAD, NATIVE VESSEL 04/19/2010   nonobstructive by cath 2007:  oLAD 20-30%, mLAD 50%, pCFX 20-30%, oAVCFX 20-30%, L renal art 50%;  normal LVF  . Cataract    bil cataracts removed  . COPD (chronic obstructive pulmonary disease) ([place])   . Diabetes mellitus without complication ([place])   . GERD 04/09/2007  . Headache(784.0)   . HYPERLIPIDEMIA 04/09/2007   Patient denies.  [name]  HYPERTENSION, BENIGN 04/19/2010  . Hypothyroidism   . Hypothyroidism    previous hyperthyroidism, s/p I-131  . LUMBAR DISC DISORDER 05/27/2010  . Nerve damage    Left leg  . OSTEOARTHRITIS, LUMBAR SPINE 04/09/2007  . RENAL CYST 05/27/2010  . Spinal headache    After having back surgery in 2014    Family History  Problem Relation Age of Onset  . Cancer Mother        Breast Cancer, Uterine Cancer  . Breast cancer Mother   . Uterine cancer Mother   . Heart disease Mother   . Heart attack Mother   . Prostate cancer Father   . Hypertension Brother   . Stomach cancer Paternal Grandmother   . Thyroid disease Neg Hx   . Colon cancer Neg Hx   . Esophageal cancer Neg Hx   . Rectal cancer Neg Hx   . Pancreatic cancer Neg Hx  Past Surgical History:  Procedure Laterality Date  . BACK SURGERY  2012,2014  . CARDIAC CATHETERIZATION  2007   Dr Loanne Drilling  . CHOLECYSTECTOMY    . CHOLECYSTECTOMY N/A 07/13/2013   Procedure: LAPAROSCOPIC CHOLECYSTECTOMY WITH INTRAOPERATIVE CHOLANGIOGRAM;  Surgeon: Shann Medal, MD;  [facility];  Service: General;  Laterality: N/A;  . COLONOSCOPY    . COLONOSCOPY W/ POLYPECTOMY    . ERCP N/A 07/14/2013   Procedure: ENDOSCOPIC RETROGRADE CHOLANGIOPANCREATOGRAPHY (ERCP);  Surgeon: [name], MD;  Location: [facility];  Service: Endoscopy;  Laterality: N/A;  . EYE SURGERY Bilateral    Cataract removal  . FOOT FRACTURE SURGERY    . FRACTURE SURGERY    . LUMBAR DISC SURGERY  08/06/2012   L3 & L4  . LUMBAR DISC SURGERY  11/11/2015   L1 L2    DR NITKA  . LUMBAR FUSION N/A 04/20/2015   Procedure: T12 to L1 fusion (Extension of Previous Fusion L2-S1 to T12-S1), Right Transforaminal lumbar interbody fusion, Posterior Fusion T12 to L1, with Pedicle screws, allograft, local bone graft, and  Vivigen;  Surgeon: [name], MD;  Location: [facility];  Service: Orthopedics;  Laterality: N/A;  . LUMBAR LAMINECTOMY N/A 11/10/2013   Procedure: Left L1-2 far  lateral approach to excise herniated nucleus pulposus;  Surgeon: [name], MD;  Location: [facility];  Service: Orthopedics;  Laterality: N/A;  . LUMBAR LAMINECTOMY/DECOMPRESSION MICRODISCECTOMY Left 08/10/2012   Procedure: Dura Repair Left Side L2-L3;  Surgeon: [name], MD;  Location: [facility];  Service: Orthopedics;  Laterality: Left;  Wilson Frame, Sliding table, dura repair kit, microscope  . NECK SURGERY     X 2  . SPINE SURGERY  2006   C-spine surgery x 2  . UPPER GASTROINTESTINAL ENDOSCOPY     Social History   Occupational History  . 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 Smoking status: Former Smoker    Packs/day: 0.50    Years: 44.00    Pack years: 22.00    Types: Cigarettes    Last attempt to quit: 04/07/2007    Years since quitting: 10.7  . Smokeless tobacco: Never Used  . Tobacco comment: pt has stopped smoking about 9 months now  Substance and Sexual Activity  . Alcohol use: No  . Drug use: No  . Sexual activity: Not Currently

## 2018-01-16 NOTE — Patient Instructions (Signed)
Avoid frequent bending and stooping  No lifting greater than 10 lbs. May use ice or moist heat for pain. Weight loss is of benefit. Handicap license is approved.   

## 2018-01-20 ENCOUNTER — Other Ambulatory Visit: Payer: Self-pay | Admitting: Endocrinology

## 2018-01-23 ENCOUNTER — Ambulatory Visit
Admission: RE | Admit: 2018-01-23 | Discharge: 2018-01-23 | Disposition: A | Discharge status: Home / Self Care | Payer: PPO | Source: Ambulatory Visit | Attending: Specialist | Admitting: Specialist

## 2018-01-23 ENCOUNTER — Encounter

## 2018-01-23 DIAGNOSIS — M545 Low back pain: Secondary | ICD-10-CM

## 2018-01-23 DIAGNOSIS — N401 Enlarged prostate with lower urinary tract symptoms: Secondary | ICD-10-CM | POA: Diagnosis not present

## 2018-01-23 DIAGNOSIS — M7062 Trochanteric bursitis, left hip: Secondary | ICD-10-CM | POA: Diagnosis not present

## 2018-01-23 DIAGNOSIS — R1032 Left lower quadrant pain: Secondary | ICD-10-CM

## 2018-01-23 DIAGNOSIS — M25552 Pain in left hip: Secondary | ICD-10-CM

## 2018-01-26 ENCOUNTER — Other Ambulatory Visit: Payer: Self-pay | Admitting: Cardiovascular Disease

## 2018-01-28 ENCOUNTER — Other Ambulatory Visit: Payer: Self-pay | Admitting: Gastroenterology

## 2018-01-28 ENCOUNTER — Other Ambulatory Visit: Payer: Self-pay | Admitting: Cardiovascular Disease

## 2018-01-28 NOTE — Telephone Encounter (Signed)
Spoke to patient. An appointment has been made for 10/19 for 2 year follow up and med check. Patient has enough medication to get him to his next appointment.

## 2018-01-30 ENCOUNTER — Telehealth (INDEPENDENT_AMBULATORY_CARE_PROVIDER_SITE_OTHER): Payer: Self-pay | Admitting: Specialist

## 2018-01-30 NOTE — Telephone Encounter (Signed)
Patient came into the clinic stating that he had an MRI last Wednesday and wanted to know if there was an appointment available before 03/04/18 in order for him to get his results.  He also wanted to know if someone would call him if there was something serious would someone call him?  Patient would like to be placed on the cancellation list.  CB#[phone].  Thank you.

## 2018-01-30 NOTE — Telephone Encounter (Signed)
I put him on the cancellation list 

## 2018-02-01 NOTE — Telephone Encounter (Signed)
Can you review his MRI and make sure nothing is serious on his scan?  He also wanted to know if someone would call him if there was something serious would someone call him?

## 2018-02-04 DIAGNOSIS — N644 Mastodynia: Secondary | ICD-10-CM | POA: Diagnosis not present

## 2018-02-07 ENCOUNTER — Ambulatory Visit (INDEPENDENT_AMBULATORY_CARE_PROVIDER_SITE_OTHER): Payer: PPO | Admitting: Endocrinology

## 2018-02-07 ENCOUNTER — Other Ambulatory Visit: Payer: Self-pay | Admitting: Endocrinology

## 2018-02-07 DIAGNOSIS — N644 Mastodynia: Secondary | ICD-10-CM

## 2018-02-07 DIAGNOSIS — E119 Type 2 diabetes mellitus without complications: Secondary | ICD-10-CM | POA: Diagnosis not present

## 2018-02-07 LAB — POCT GLYCOSYLATED HEMOGLOBIN (HGB A1C): HEMOGLOBIN A1C: 7.1 % — AB (ref 4.0–5.6)

## 2018-02-07 MED ORDER — METFORMIN HCL ER 500 MG PO TB24: 1000.0000 mg | ORAL_TABLET | Freq: Every day | ORAL | 3 refills | Status: DC

## 2018-02-07 NOTE — Patient Instructions (Addendum)
Let's check the mammogram test.  you will receive a phone call, about a day and time for an appointment.   I have sent a prescription to your pharmacy, to increase the metformin.  Based on the results, you may also need blood tests for this, which we can do at your physical.

## 2018-02-07 NOTE — Progress Notes (Signed)
Subjective:    Patient ID: Jeff Lopez, male    DOB: Oct 28, 1950, 67 y.o.   MRN: [id number]  HPI 2 weeks of moderate pain at the left nipple, but no assoc swelling.   he takes metformin 500/d.   Past Medical History:  Diagnosis Date  . CAD, NATIVE VESSEL 04/19/2010   nonobstructive by cath 2007:  oLAD 20-30%, mLAD 50%, pCFX 20-30%, oAVCFX 20-30%, L renal art 50%;  normal LVF  . Cataract    bil cataracts removed  . COPD (chronic obstructive pulmonary disease) ([place])   . Diabetes mellitus without complication ([place])   . GERD 04/09/2007  . Headache(784.0)   . HYPERLIPIDEMIA 04/09/2007   Patient denies.  [name] HYPERTENSION, BENIGN 04/19/2010  . Hypothyroidism   . Hypothyroidism    previous hyperthyroidism, s/p I-131  . LUMBAR DISC DISORDER 05/27/2010  . Nerve damage    Left leg  . OSTEOARTHRITIS, LUMBAR SPINE 04/09/2007  . RENAL CYST 05/27/2010  . Spinal headache    After having back surgery in 2014    Past Surgical History:  Procedure Laterality Date  . BACK SURGERY  2012,2014  . CARDIAC CATHETERIZATION  2007   Dr Loanne Drilling  . CHOLECYSTECTOMY    . CHOLECYSTECTOMY N/A 07/13/2013   Procedure: LAPAROSCOPIC CHOLECYSTECTOMY WITH INTRAOPERATIVE CHOLANGIOGRAM;  Surgeon: Shann Medal, MD;  [facility];  Service: General;  Laterality: N/A;  . COLONOSCOPY    . COLONOSCOPY W/ POLYPECTOMY    . ERCP N/A 07/14/2013   Procedure: ENDOSCOPIC RETROGRADE CHOLANGIOPANCREATOGRAPHY (ERCP);  Surgeon: [name], MD;  Location: [facility];  Service: Endoscopy;  Laterality: N/A;  . EYE SURGERY Bilateral    Cataract removal  . FOOT FRACTURE SURGERY    . FRACTURE SURGERY    . LUMBAR DISC SURGERY  08/06/2012   L3 & L4  . LUMBAR DISC SURGERY  11/11/2015   L1 L2    DR NITKA  . LUMBAR FUSION N/A 04/20/2015   Procedure: T12 to L1 fusion (Extension of Previous Fusion L2-S1 to T12-S1), Right Transforaminal lumbar interbody fusion, Posterior Fusion T12 to L1, with Pedicle screws, allograft,  local bone graft, and  Vivigen;  Surgeon: [name], MD;  Location: [facility];  Service: Orthopedics;  Laterality: N/A;  . LUMBAR LAMINECTOMY N/A 11/10/2013   Procedure: Left L1-2 far lateral approach to excise herniated nucleus pulposus;  Surgeon: [name], MD;  Location: [facility];  Service: Orthopedics;  Laterality: N/A;  . LUMBAR LAMINECTOMY/DECOMPRESSION MICRODISCECTOMY Left 08/10/2012   Procedure: Dura Repair Left Side L2-L3;  Surgeon: [name], MD;  Location: [facility];  Service: Orthopedics;  Laterality: Left;  Wilson Frame, Sliding table, dura repair kit, microscope  . NECK SURGERY     X 2  . SPINE SURGERY  2006   C-spine surgery x 2  . UPPER GASTROINTESTINAL ENDOSCOPY      Social History   Socioeconomic History  . Marital status: Single    Spouse name: Not on file  . Number of children: 1  . Years of education: Not on file  . Highest education level: Not on file  Occupational History  . Occupation: DISTRIBUTION MGR    Employer: [employer]  Social Needs  . Financial resource strain: Not on file  . Food insecurity:    Worry: Not on file    Inability: Not on file  . Transportation needs:    Medical: Not on file    Non-medical: Not on file  Tobacco Use  .  Smoking status: Former Smoker    Packs/day: 0.50    Years: 44.00    Pack years: 22.00    Types: Cigarettes    Last attempt to quit: 04/07/2007    Years since quitting: 10.8  . Smokeless tobacco: Never Used  . Tobacco comment: pt has stopped smoking about 9 months now  Substance and Sexual Activity  . Alcohol use: No  . Drug use: No  . Sexual activity: Not Currently  Lifestyle  . Physical activity:    Days per week: Not on file    Minutes per session: Not on file  . Stress: Not on file  Relationships  . Social connections:    Talks on phone: Not on file    Gets together: Not on file    Attends religious service: Not on file    Active member of club or organization: Not on file    Attends  meetings of clubs or organizations: Not on file    Relationship status: Not on file  . Intimate partner violence:    Fear of current or ex partner: Not on file    Emotionally abused: Not on file    Physically abused: Not on file    Forced sexual activity: Not on file  Other Topics Concern  . Not on file  Social History Narrative   Works in Psychologist, educational. Retired.   Lives alone, has one child in [place].     Current Outpatient Medications on File Prior to Visit  Medication Sig Dispense Refill  . irbesartan (AVAPRO) 150 MG tablet Take 2 tablets (300 mg total) by mouth daily. 180 tablet 1  . metoprolol succinate (TOPROL-XL) 50 MG 24 hr tablet Take 1 tablet (50 mg total) by mouth daily. Please make overdue yearly appt with Dr. Burt Knack before anymore refills. 1st attempt 30 tablet 0  . pantoprazole (PROTONIX) 40 MG tablet TAKE 1 TABLET BY MOUTH TWICE A DAY BEFORE MEALS 60 tablet 0  . pantoprazole (PROTONIX) 40 MG tablet TAKE 1 TABLET BY MOUTH TWICE A DAY BEFORE MEALS 180 tablet 0  . spironolactone (ALDACTONE) 50 MG tablet Take 1 tablet (50 mg total) by mouth daily. 90 tablet 1   No current facility-administered medications on file prior to visit.     Allergies  Allergen Reactions  . Ceftin Other (See Comments)    Patient stated it caused "sores in mouth" Thrush  . Cefuroxime Axetil Other (See Comments)    Sores in mouth    Family History  Problem Relation Age of Onset  . Cancer Mother        Breast Cancer, Uterine Cancer  . Breast cancer Mother   . Uterine cancer Mother   . Heart disease Mother   . Heart attack Mother   . Prostate cancer Father   . Hypertension Brother   . Stomach cancer Paternal Grandmother   . Thyroid disease Neg Hx   . Colon cancer Neg Hx   . Esophageal cancer Neg Hx   . Rectal cancer Neg Hx   . Pancreatic cancer Neg Hx     There were no vitals taken for this visit.  Review of Systems Denies fever or drainage.     Objective:   Physical  Exam GENERAL: no distress  Left nipple: tender, but no swell/red/drainage/warmth.    Lab Results  Component Value Date   HGBA1C 7.1 (A) 02/07/2018       Assessment & Plan:  Mastalgia, new, uncertain etiology Type 2 DM: he needs increased  rx   Patient Instructions  Let's check the mammogram test.  you will receive a phone call, about a day and time for an appointment.   I have sent a prescription to your pharmacy, to increase the metformin.  Based on the results, you may also need blood tests for this, which we can do at your physical.

## 2018-02-12 ENCOUNTER — Encounter (INDEPENDENT_AMBULATORY_CARE_PROVIDER_SITE_OTHER): Payer: Self-pay | Admitting: Specialist

## 2018-02-12 ENCOUNTER — Ambulatory Visit (INDEPENDENT_AMBULATORY_CARE_PROVIDER_SITE_OTHER): Payer: PPO | Admitting: Specialist

## 2018-02-12 ENCOUNTER — Telehealth (INDEPENDENT_AMBULATORY_CARE_PROVIDER_SITE_OTHER): Payer: Self-pay | Admitting: Specialist

## 2018-02-12 VITALS — BP 129/80 | HR 65 | Ht 71.0 in | Wt 200.0 lb

## 2018-02-12 DIAGNOSIS — M792 Neuralgia and neuritis, unspecified: Secondary | ICD-10-CM | POA: Diagnosis not present

## 2018-02-12 DIAGNOSIS — M4325 Fusion of spine, thoracolumbar region: Secondary | ICD-10-CM | POA: Diagnosis not present

## 2018-02-12 NOTE — Telephone Encounter (Signed)
I have put him on the cancellation list 

## 2018-02-12 NOTE — Patient Instructions (Signed)
Avoid frequent bending and stooping  No lifting greater than 10 lbs. May use ice or moist heat for pain. Weight loss is of benefit. Handicap license is approved. MRI of the thoracic spine ordered to evaluate for left lower thoracic area of signs of nerve compression.

## 2018-02-12 NOTE — Telephone Encounter (Signed)
Patient scheduled 03/27/18 with Dr Louanne Skye. Patient was to come back in 3 wks for MRI review. Please add patient to wait list.

## 2018-02-12 NOTE — Progress Notes (Signed)
Office Visit Note   Patient: Jeff Lopez           Date of Birth: 1950/11/27           MRN: [id number] Visit Date: 02/12/2018              Requested by: Renato Shin, MD [address] [place], [place] [address] PCP: Renato Shin, MD   Assessment & Plan: Visit Diagnoses:  1. Thoracic neuralgia   2. Fusion of spine of thoracolumbar region     Plan: Avoid frequent bending and stooping  No lifting greater than 10 lbs. May use ice or moist heat for pain. Weight loss is of benefit. Handicap license is approved. MRI of the thoracic spine ordered to evaluate for left lower thoracic area of signs of nerve compression.  Follow-Up Instructions: Return in about 3 weeks (around 03/05/2018).   Orders:  Orders Placed This Encounter  Procedures  . MR Thoracic Spine w/o contrast   No orders of the defined types were placed in this encounter.     Procedures: No procedures performed   Clinical Data: No additional findings.   Subjective: Chief Complaint  Patient presents with  . Left Hip - Pain, Follow-up    67 year old right handed male with history of cervical fusions in 2005 and 2006 and previous lumbar fusions L3 to S1. He has increasing pain in the left inguinal area with standing and walking. MRI of the  Pelvis is negative for left sided cause of the pain.     Review of Systems  Constitutional: Negative.   HENT: Negative.   Eyes: Negative.   Respiratory: Negative.   Cardiovascular: Negative.   Gastrointestinal: Negative.   Endocrine: Negative.   Genitourinary: Negative.   Musculoskeletal: Negative.   Skin: Negative.   Allergic/Immunologic: Negative.   Neurological: Negative.   Hematological: Negative.   Psychiatric/Behavioral: Negative.      Objective: Vital Signs: BP 129/80 (BP Location: Left Arm, Patient Position: Sitting)   Pulse 65   Ht _0  (1.803 m)   Wt 200 lb (90.7 kg)   BMI 27.89 kg/m   Physical Exam  Constitutional:  He is oriented to person, place, and time. He appears well-developed and well-nourished.  HENT:  Head: Normocephalic and atraumatic.  Eyes: Pupils are equal, round, and reactive to light. EOM are normal.  Neck: Normal range of motion. Neck supple.  Pulmonary/Chest: Effort normal and breath sounds normal.  Abdominal: Soft. Bowel sounds are normal.  Neurological: He is alert and oriented to person, place, and time.  Skin: Skin is warm and dry.  Psychiatric: He has a normal mood and affect. His behavior is normal. Judgment and thought content normal.    Back Exam   Tenderness  The patient is experiencing tenderness in the lumbar.  Range of Motion  Extension: abnormal  Flexion: abnormal  Lateral bend right: abnormal  Lateral bend left: abnormal  Rotation right: abnormal  Rotation left: abnormal   Muscle Strength  Right Quadriceps:  5/5  Left Quadriceps:  5/5  Right Hamstrings:  5/5  Left Hamstrings:  5/5   Tests  Straight leg raise right: negative Straight leg raise left: negative  Reflexes  Patellar: normal Achilles: normal Babinski's sign: normal   Other  Toe walk: normal Heel walk: normal Sensation: normal Gait: normal  Erythema: no back redness Scars: present      Specialty Comments:  No specialty comments available.  Imaging: No results found.  PMFS History: Patient Active Problem List   Diagnosis Date Noted  . Spinal stenosis, lumbar region, with neurogenic claudication 04/21/2015    Priority: High    Class: Chronic  . Spondylolisthesis of lumbar region 04/21/2015    Priority: High    Class: Chronic  . Postoperative CSF leak 08/10/2012    Priority: High    Class: Acute  . HNP (herniated nucleus pulposus), lumbar 08/06/2012    Priority: High    Class: Acute  . Mastalgia 02/07/2018  . Left lower quadrant pain 12/26/2017  . Dyspnea 10/03/2017  . Arthralgia 10/03/2017  . Numbness 10/03/2017  . Diaphoresis 01/04/2017  . Chronic left  sacroiliac pain 10/19/2016  . Chronic left-sided low back pain with left-sided sciatica 07/31/2016  . Hypokalemia 10/14/2015  . Hypocalcemia 10/14/2015  . Hypothyroidism following radioiodine therapy 03/10/2015  . Screening for prostate cancer 03/10/2015  . Hemochromatosis 03/27/2014  . Other abnormal blood chemistry 02/12/2014  . Choledocholithiasis 07/14/2013  . Nonspecific (abnormal) findings on radiological and other examination of biliary tract 07/13/2013  . Calculus of gallbladder with acute cholecystitis, without mention of obstruction 07/13/2013  . Calculus of bile duct without mention of cholecystitis or obstruction 07/13/2013  . Abdominal pain 07/12/2013  . Neck pain on left side 05/30/2013  . Quit smoking 11/28/2012  . Degenerative disc disease, lumbar 08/06/2012    Class: Chronic  . Diabetes ([place]) 06/10/2012  . COPD GOLD 0 02/09/2012  . Chest pain at rest 02/06/2012  . Allergic rhinitis 02/06/2012  . Wellness examination 08/12/2011  . Radiculopathy of leg 11/05/2010  . Cough 08/23/2010  . RENAL CYST 05/27/2010  . LUMBAR DISC DISORDER 05/27/2010  . HYPERTENSION, BENIGN 04/19/2010  . CAD, NATIVE VESSEL 04/19/2010  . HYPERLIPIDEMIA 04/09/2007  . GERD 04/09/2007  . OSTEOARTHRITIS, LUMBAR SPINE 04/09/2007   Past Medical History:  Diagnosis Date  . CAD, NATIVE VESSEL 04/19/2010   nonobstructive by cath 2007:  oLAD 20-30%, mLAD 50%, pCFX 20-30%, oAVCFX 20-30%, L renal art 50%;  normal LVF  . Cataract    bil cataracts removed  . COPD (chronic obstructive pulmonary disease) ([place])   . Diabetes mellitus without complication ([place])   . GERD 04/09/2007  . Headache(784.0)   . HYPERLIPIDEMIA 04/09/2007   Patient denies.  [name] HYPERTENSION, BENIGN 04/19/2010  . Hypothyroidism   . Hypothyroidism    previous hyperthyroidism, s/p I-131  . LUMBAR DISC DISORDER 05/27/2010  . Nerve damage    Left leg  . OSTEOARTHRITIS, LUMBAR SPINE 04/09/2007  . RENAL CYST 05/27/2010  . Spinal  headache    After having back surgery in 2014    Family History  Problem Relation Age of Onset  . Cancer Mother        Breast Cancer, Uterine Cancer  . Breast cancer Mother   . Uterine cancer Mother   . Heart disease Mother   . Heart attack Mother   . Prostate cancer Father   . Hypertension Brother   . Stomach cancer Paternal Grandmother   . Thyroid disease Neg Hx   . Colon cancer Neg Hx   . Esophageal cancer Neg Hx   . Rectal cancer Neg Hx   . Pancreatic cancer Neg Hx     Past Surgical History:  Procedure Laterality Date  . BACK SURGERY  2012,2014  . CARDIAC CATHETERIZATION  2007   Dr Loanne Drilling  . CHOLECYSTECTOMY    . CHOLECYSTECTOMY N/A 07/13/2013   Procedure: LAPAROSCOPIC CHOLECYSTECTOMY WITH INTRAOPERATIVE CHOLANGIOGRAM;  Surgeon: Shann Medal, MD;  Location: [facility];  Service: General;  Laterality: N/A;  . COLONOSCOPY    . COLONOSCOPY W/ POLYPECTOMY    . ERCP N/A 07/14/2013   Procedure: ENDOSCOPIC RETROGRADE CHOLANGIOPANCREATOGRAPHY (ERCP);  Surgeon: [name], MD;  Location: [facility];  Service: Endoscopy;  Laterality: N/A;  . EYE SURGERY Bilateral    Cataract removal  . FOOT FRACTURE SURGERY    . FRACTURE SURGERY    . LUMBAR DISC SURGERY  08/06/2012   L3 & L4  . LUMBAR DISC SURGERY  11/11/2015   L1 L2    DR Lindwood Mogel  . LUMBAR FUSION N/A 04/20/2015   Procedure: T12 to L1 fusion (Extension of Previous Fusion L2-S1 to T12-S1), Right Transforaminal lumbar interbody fusion, Posterior Fusion T12 to L1, with Pedicle screws, allograft, local bone graft, and  Vivigen;  Surgeon: [name], MD;  Location: [facility];  Service: Orthopedics;  Laterality: N/A;  . LUMBAR LAMINECTOMY N/A 11/10/2013   Procedure: Left L1-2 far lateral approach to excise herniated nucleus pulposus;  Surgeon: [name], MD;  Location: [facility];  Service: Orthopedics;  Laterality: N/A;  . LUMBAR LAMINECTOMY/DECOMPRESSION MICRODISCECTOMY Left 08/10/2012   Procedure: Dura Repair Left Side L2-L3;   Surgeon: [name], MD;  Location: [facility];  Service: Orthopedics;  Laterality: Left;  Wilson Frame, Sliding table, dura repair kit, microscope  . NECK SURGERY     X 2  . SPINE SURGERY  2006   C-spine surgery x 2  . UPPER GASTROINTESTINAL ENDOSCOPY     Social History   Occupational History  . 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 Smoking status: Former Smoker    Packs/day: 0.50    Years: 44.00    Pack years: 22.00    Types: Cigarettes    Last attempt to quit: 04/07/2007    Years since quitting: 10.8  . Smokeless tobacco: Never Used  . Tobacco comment: pt has stopped smoking about 9 months now  Substance and Sexual Activity  . Alcohol use: No  . Drug use: No  . Sexual activity: Not Currently

## 2018-02-15 ENCOUNTER — Other Ambulatory Visit: Payer: Self-pay | Admitting: Endocrinology

## 2018-02-15 DIAGNOSIS — N644 Mastodynia: Secondary | ICD-10-CM

## 2018-02-18 ENCOUNTER — Ambulatory Visit
Admission: RE | Admit: 2018-02-18 | Discharge: 2018-02-18 | Disposition: A | Discharge status: Home / Self Care | Payer: PPO | Source: Ambulatory Visit | Attending: Endocrinology | Admitting: Endocrinology

## 2018-02-18 ENCOUNTER — Ambulatory Visit: Payer: PPO

## 2018-02-18 DIAGNOSIS — R928 Other abnormal and inconclusive findings on diagnostic imaging of breast: Secondary | ICD-10-CM | POA: Diagnosis not present

## 2018-02-18 DIAGNOSIS — N644 Mastodynia: Secondary | ICD-10-CM

## 2018-02-19 ENCOUNTER — Other Ambulatory Visit: Payer: Self-pay | Admitting: Endocrinology

## 2018-02-19 MED ORDER — TAMOXIFEN CITRATE 10 MG PO TABS: 10.0000 mg | ORAL_TABLET | Freq: Two times a day (BID) | ORAL | 11 refills | Status: DC

## 2018-02-27 ENCOUNTER — Other Ambulatory Visit: Payer: Self-pay | Admitting: Cardiovascular Disease

## 2018-02-28 ENCOUNTER — Encounter: Payer: Self-pay | Admitting: Endocrinology

## 2018-02-28 ENCOUNTER — Ambulatory Visit (INDEPENDENT_AMBULATORY_CARE_PROVIDER_SITE_OTHER): Payer: PPO | Admitting: Endocrinology

## 2018-02-28 VITALS — BP 130/90 | HR 61 | Ht 71.0 in | Wt 206.4 lb

## 2018-02-28 DIAGNOSIS — Z23 Encounter for immunization: Secondary | ICD-10-CM | POA: Diagnosis not present

## 2018-02-28 DIAGNOSIS — R7989 Other specified abnormal findings of blood chemistry: Secondary | ICD-10-CM

## 2018-02-28 DIAGNOSIS — Z0000 Encounter for general adult medical examination without abnormal findings: Secondary | ICD-10-CM | POA: Diagnosis not present

## 2018-02-28 DIAGNOSIS — N62 Hypertrophy of breast: Secondary | ICD-10-CM | POA: Insufficient documentation

## 2018-02-28 LAB — HCG, QUANTITATIVE, PREGNANCY: QUANTITATIVE HCG: 0.26 m[IU]/mL

## 2018-02-28 MED ORDER — EPLERENONE 25 MG PO TABS: 25.0000 mg | ORAL_TABLET | Freq: Two times a day (BID) | ORAL | 11 refills | Status: DC

## 2018-02-28 NOTE — Patient Instructions (Addendum)
I have sent a prescription to your pharmacy, for a pill to substitute for the spironolactone. blood tests are requested for you today.  We'll let you know about the results. Please consider these measures for your health:  minimize alcohol.  Do not use tobacco products.  Have a colonoscopy at least every 10 years from age 67.  Keep firearms safely stored.  Always use seat belts.  have working smoke alarms in your home.  See an eye doctor and dentist regularly.  Never drive under the influence of alcohol or drugs (including prescription drugs).  Those with fair skin should take precautions against the sun, and should carefully examine their skin once per month, for any new or changed moles.   Please go back to see the heart doctor. Please come back for a follow-up appointment in 3 months.        Diabetes Mellitus and Nutrition When you have diabetes (diabetes mellitus), it is very important to have healthy eating habits because your blood sugar (glucose) levels are greatly affected by what you eat and drink. Eating healthy foods in the appropriate amounts, at about the same times every day, can help you:  Control your blood glucose.  Lower your risk of heart disease.  Improve your blood pressure.  Reach or maintain a healthy weight.  Every person with diabetes is different, and each person has different needs for a meal plan. Your health care provider may recommend that you work with a diet and nutrition specialist (dietitian) to make a meal plan that is best for you. Your meal plan may vary depending on factors such as:  The calories you need.  The medicines you take.  Your weight.  Your blood glucose, blood pressure, and cholesterol levels.  Your activity level.  Other health conditions you have, such as heart or kidney disease.  How do carbohydrates affect me? Carbohydrates affect your blood glucose level more than any other type of food. Eating carbohydrates naturally increases  the amount of glucose in your blood. Carbohydrate counting is a method for keeping track of how many carbohydrates you eat. Counting carbohydrates is important to keep your blood glucose at a healthy level, especially if you use insulin or take certain oral diabetes medicines. It is important to know how many carbohydrates you can safely have in each meal. This is different for every person. Your dietitian can help you calculate how many carbohydrates you should have at each meal and for snack. Foods that contain carbohydrates include:  Bread, cereal, rice, pasta, and crackers.  Potatoes and corn.  Peas, beans, and lentils.  Milk and yogurt.  Fruit and juice.  Desserts, such as cakes, cookies, ice cream, and candy.  How does alcohol affect me? Alcohol can cause a sudden decrease in blood glucose (hypoglycemia), especially if you use insulin or take certain oral diabetes medicines. Hypoglycemia can be a life-threatening condition. Symptoms of hypoglycemia (sleepiness, dizziness, and confusion) are similar to symptoms of having too much alcohol. If your health care provider says that alcohol is safe for you, follow these guidelines:  Limit alcohol intake to no more than 1 drink per day for nonpregnant women and 2 drinks per day for men. One drink equals 12 oz of beer, 5 oz of wine, or 1 oz of hard liquor.  Do not drink on an empty stomach.  Keep yourself hydrated with water, diet soda, or unsweetened iced tea.  Keep in mind that regular soda, juice, and other mixers may contain  a lot of sugar and must be counted as carbohydrates.  What are tips for following this plan? Reading food labels  Start by checking the serving size on the label. The amount of calories, carbohydrates, fats, and other nutrients listed on the label are based on one serving of the food. Many foods contain more than one serving per package.  Check the total grams (g) of carbohydrates in one serving. You can  calculate the number of servings of carbohydrates in one serving by dividing the total carbohydrates by 15. For example, if a food has 30 g of total carbohydrates, it would be equal to 2 servings of carbohydrates.  Check the number of grams (g) of saturated and trans fats in one serving. Choose foods that have low or no amount of these fats.  Check the number of milligrams (mg) of sodium in one serving. Most people should limit total sodium intake to less than 2,300 mg per day.  Always check the nutrition information of foods labeled as "low-fat" or "nonfat". These foods may be higher in added sugar or refined carbohydrates and should be avoided.  Talk to your dietitian to identify your daily goals for nutrients listed on the label. Shopping  Avoid buying canned, premade, or processed foods. These foods tend to be high in fat, sodium, and added sugar.  Shop around the outside edge of the grocery store. This includes fresh fruits and vegetables, bulk grains, fresh meats, and fresh dairy. Cooking  Use low-heat cooking methods, such as baking, instead of high-heat cooking methods like deep frying.  Cook using healthy oils, such as olive, canola, or sunflower oil.  Avoid cooking with butter, cream, or high-fat meats. Meal planning  Eat meals and snacks regularly, preferably at the same times every day. Avoid going long periods of time without eating.  Eat foods high in fiber, such as fresh fruits, vegetables, beans, and whole grains. Talk to your dietitian about how many servings of carbohydrates you can eat at each meal.  Eat 4-6 ounces of lean protein each day, such as lean meat, chicken, fish, eggs, or tofu. 1 ounce is equal to 1 ounce of meat, chicken, or fish, 1 egg, or 1/4 cup of tofu.  Eat some foods each day that contain healthy fats, such as avocado, nuts, seeds, and fish. Lifestyle   Check your blood glucose regularly.  Exercise at least 30 minutes 5 or more days each week,  or as told by your health care provider.  Take medicines as told by your health care provider.  Do not use any products that contain nicotine or tobacco, such as cigarettes and e-cigarettes. If you need help quitting, ask your health care provider.  Work with a Social worker or diabetes educator to identify strategies to manage stress and any emotional and social challenges. What are some questions to ask my health care provider?  Do I need to meet with a diabetes educator?  Do I need to meet with a dietitian?  What number can I call if I have questions?  When are the best times to check my blood glucose? Where to find more information:  American Diabetes Association: diabetes.org/food-and-fitness/food  Academy of Nutrition and Dietetics: PokerClues.dk  [employer] of Diabetes and Digestive and Kidney Diseases (NIH): ContactWire.be Summary  A healthy meal plan will help you control your blood glucose and maintain a healthy lifestyle.  Working with a diet and nutrition specialist (dietitian) can help you make a meal plan that is best for  you.  Keep in mind that carbohydrates and alcohol have immediate effects on your blood glucose levels. It is important to count carbohydrates and to use alcohol carefully. This information is not intended to replace advice given to you by your health care provider. Make sure you discuss any questions you have with your health care provider. Document Released: 03/09/2005 Document Revised: 07/17/2016 Document Reviewed: 07/17/2016 Elsevier Interactive Patient Education  [employer].

## 2018-02-28 NOTE — Progress Notes (Signed)
Subjective:    Patient ID: Jeff Lopez, male    DOB: [birth year], 67 y.o.   MRN: [id number]  HPI Pt is here for regular wellness examination, and is feeling pretty well in general, and says chronic med probs are stable, except as noted below Past Medical History:  Diagnosis Date  . CAD, NATIVE VESSEL 04/19/2010   nonobstructive by cath 2007:  oLAD 20-30%, mLAD 50%, pCFX 20-30%, oAVCFX 20-30%, L renal art 50%;  normal LVF  . Cataract    bil cataracts removed  . COPD (chronic obstructive pulmonary disease) ([place])   . Diabetes mellitus without complication ([place])   . GERD 04/09/2007  . Headache(784.0)   . HYPERLIPIDEMIA 04/09/2007   Patient denies.  [name] HYPERTENSION, BENIGN 04/19/2010  . Hypothyroidism   . Hypothyroidism    previous hyperthyroidism, s/p I-131  . LUMBAR DISC DISORDER 05/27/2010  . Nerve damage    Left leg  . OSTEOARTHRITIS, LUMBAR SPINE 04/09/2007  . RENAL CYST 05/27/2010  . Spinal headache    After having back surgery in 2014    Past Surgical History:  Procedure Laterality Date  . BACK SURGERY  2012,2014  . CARDIAC CATHETERIZATION  2007   Dr Loanne Drilling  . CHOLECYSTECTOMY    . CHOLECYSTECTOMY N/A 07/13/2013   Procedure: LAPAROSCOPIC CHOLECYSTECTOMY WITH INTRAOPERATIVE CHOLANGIOGRAM;  Surgeon: Shann Medal, MD;  [facility]: [facility];  Service: General;  Laterality: N/A;  . COLONOSCOPY    . COLONOSCOPY W/ POLYPECTOMY    . ERCP N/A 07/14/2013   Procedure: ENDOSCOPIC RETROGRADE CHOLANGIOPANCREATOGRAPHY (ERCP);  Surgeon: [name], MD;  Location: [facility];  Service: Endoscopy;  Laterality: N/A;  . EYE SURGERY Bilateral    Cataract removal  . FOOT FRACTURE SURGERY    . FRACTURE SURGERY    . LUMBAR DISC SURGERY  08/06/2012   L3 & L4  . LUMBAR DISC SURGERY  11/11/2015   L1 L2    DR NITKA  . LUMBAR FUSION N/A 04/20/2015   Procedure: T12 to L1 fusion (Extension of Previous Fusion L2-S1 to T12-S1), Right Transforaminal lumbar interbody fusion, Posterior Fusion  T12 to L1, with Pedicle screws, allograft, local bone graft, and  Vivigen;  Surgeon: [name], MD;  Location: [facility];  Service: Orthopedics;  Laterality: N/A;  . LUMBAR LAMINECTOMY N/A 11/10/2013   Procedure: Left L1-2 far lateral approach to excise herniated nucleus pulposus;  Surgeon: [name], MD;  Location: [facility];  Service: Orthopedics;  Laterality: N/A;  . LUMBAR LAMINECTOMY/DECOMPRESSION MICRODISCECTOMY Left 08/10/2012   Procedure: Dura Repair Left Side L2-L3;  Surgeon: [name], MD;  Location: [facility];  Service: Orthopedics;  Laterality: Left;  Wilson Frame, Sliding table, dura repair kit, microscope  . NECK SURGERY     X 2  . SPINE SURGERY  2006   C-spine surgery x 2  . UPPER GASTROINTESTINAL ENDOSCOPY      Social History   Socioeconomic History  . Marital status: Single    Spouse name: Not on file  . Number of children: 1  . Years of education: Not on file  . Highest education level: Not on file  Occupational History  . Occupation: DISTRIBUTION MGR    Employer: [employer]  Social Needs  . Financial resource strain: Not on file  . Food insecurity:    Worry: Not on file    Inability: Not on file  . Transportation needs:    Medical: Not on file    Non-medical: Not on file  Tobacco Use  . Smoking status: Former Smoker    Packs/day: 0.50    Years: 44.00    Pack years: 22.00    Types: Cigarettes    Last attempt to quit: 04/07/2007    Years since quitting: 10.9  . Smokeless tobacco: Never Used  . Tobacco comment: pt has stopped smoking about 9 months now  Substance and Sexual Activity  . Alcohol use: No  . Drug use: No  . Sexual activity: Not Currently  Lifestyle  . Physical activity:    Days per week: Not on file    Minutes per session: Not on file  . Stress: Not on file  Relationships  . Social connections:    Talks on phone: Not on file    Gets together: Not on file    Attends religious service: Not on file    Active member of club or  organization: Not on file    Attends meetings of clubs or organizations: Not on file    Relationship status: Not on file  . Intimate partner violence:    Fear of current or ex partner: Not on file    Emotionally abused: Not on file    Physically abused: Not on file    Forced sexual activity: Not on file  Other Topics Concern  . Not on file  Social History Narrative   Works in Psychologist, educational. Retired.   Lives alone, has one child in [place].     Current Outpatient Medications on File Prior to Visit  Medication Sig Dispense Refill  . irbesartan (AVAPRO) 150 MG tablet Take 2 tablets (300 mg total) by mouth daily. 180 tablet 1  . levothyroxine (SYNTHROID, LEVOTHROID) 125 MCG tablet TAKE 1 TABLET BY MOUTH EVERY DAY BEFORE BREAKFAST. 90 tablet 0  . metFORMIN (GLUCOPHAGE-XR) 500 MG 24 hr tablet Take 2 tablets (1,000 mg total) by mouth daily. 180 tablet 3  . metoprolol succinate (TOPROL-XL) 50 MG 24 hr tablet Take 1 tablet (50 mg total) by mouth daily. Please make overdue yearly appt with Dr. Burt Knack before anymore refills. 1st attempt 30 tablet 0  . pantoprazole (PROTONIX) 40 MG tablet TAKE 1 TABLET BY MOUTH TWICE A DAY BEFORE MEALS 60 tablet 0  . tamoxifen (NOLVADEX) 10 MG tablet Take 1 tablet (10 mg total) by mouth 2 (two) times daily. 30 tablet 11   No current facility-administered medications on file prior to visit.     Allergies  Allergen Reactions  . Ceftin Other (See Comments)    Patient stated it caused "sores in mouth" Thrush  . Cefuroxime Axetil Other (See Comments)    Sores in mouth    Family History  Problem Relation Age of Onset  . Cancer Mother        Breast Cancer, Uterine Cancer  . Breast cancer Mother   . Uterine cancer Mother   . Heart disease Mother   . Heart attack Mother   . Prostate cancer Father   . Hypertension Brother   . Stomach cancer Paternal Grandmother   . Thyroid disease Neg Hx   . Colon cancer Neg Hx   . Esophageal cancer Neg Hx   . Rectal  cancer Neg Hx   . Pancreatic cancer Neg Hx     BP 130/90 (BP Location: Right Arm)   Pulse 61   Ht _0  (1.803 m)   Wt 206 lb 6.4 oz (93.6 kg)   SpO2 97%   BMI 28.79 kg/m     Review of Systems  Denies fever, visual loss, hearing loss, chest pain, sob, depression, cold intolerance, BRBPR, hematuria, syncope, numbness, easy bruising, and rash.  He has fatigue, rhinorrhea, easy bruising, and chronic back pain.  He has gained weight.       Objective:   Physical Exam VS: see vs page GEN: no distress HEAD: head: no deformity eyes: no periorbital swelling, no proptosis external nose and ears are normal mouth: no lesion seen NECK: a healed scar is present (C-spine procedure).  I do not appreciate a nodule in the thyroid or elsewhere in the neck CHEST WALL: no deformity   CV: reg rate and rhythm, no murmur.   ABD: abdomen is soft, nontender.  no hepatosplenomegaly.  not distended.  no hernia MUSCULOSKELETAL: muscle bulk and strength are grossly normal.  joint swelling.  gait is normal and steady.  Old healed surgical scar at the midline lower back.   EXTEMITIES: no deformity.  no ulcer on the feet.  feet are of normal color and temp.  Trace bilat edema of the legs.  Old healed surgical scar on the left foot.   PULSES: dorsalis pedis intact bilat.  no carotid bruit NEURO:  cn 2-12 grossly intact.   readily moves all 4's.  sensation is intact to touch on the feet.   SKIN:  Normal texture and temperature.  No rash or suspicious lesion is visible.   NODES:  None palpable at the neck.  PSYCH: alert, well-oriented.  Does not appear anxious nor depressed.  I personally reviewed electrocardiogram tracing (today): Indication: HTN Impression: SB.  No MI.  No hypertrophy. Compared to 2018: no significant change.        Assessment & Plan:  Wellness visit today, with problems stable, except as noted.  SEPARATE EVALUATION FOLLOWS--EACH PROBLEM HERE IS NEW, NOT RESPONDING TO TREATMENT, OR  POSES SIGNIFICANT RISK TO [name] HEALTH:  HISTORY OF THE PRESENT ILLNESS: I advised pt to ask cardiol if he could change aldactone to inspra, but he just stopped aldactone. He says breast pain is resolved PAST MEDICAL HISTORY Past Medical History:  Diagnosis Date  . CAD, NATIVE VESSEL 04/19/2010   nonobstructive by cath 2007:  oLAD 20-30%, mLAD 50%, pCFX 20-30%, oAVCFX 20-30%, L renal art 50%;  normal LVF  . Cataract    bil cataracts removed  . COPD (chronic obstructive pulmonary disease) ([place])   . Diabetes mellitus without complication ([place])   . GERD 04/09/2007  . Headache(784.0)   . HYPERLIPIDEMIA 04/09/2007   Patient denies.  [name] HYPERTENSION, BENIGN 04/19/2010  . Hypothyroidism   . Hypothyroidism    previous hyperthyroidism, s/p I-131  . LUMBAR DISC DISORDER 05/27/2010  . Nerve damage    Left leg  . OSTEOARTHRITIS, LUMBAR SPINE 04/09/2007  . RENAL CYST 05/27/2010  . Spinal headache    After having back surgery in 2014    Past Surgical History:  Procedure Laterality Date  . BACK SURGERY  2012,2014  . CARDIAC CATHETERIZATION  2007   Dr Loanne Drilling  . CHOLECYSTECTOMY    . CHOLECYSTECTOMY N/A 07/13/2013   Procedure: LAPAROSCOPIC CHOLECYSTECTOMY WITH INTRAOPERATIVE CHOLANGIOGRAM;  Surgeon: Shann Medal, MD;  [facility];  Service: General;  Laterality: N/A;  . COLONOSCOPY    . COLONOSCOPY W/ POLYPECTOMY    . ERCP N/A 07/14/2013   Procedure: ENDOSCOPIC RETROGRADE CHOLANGIOPANCREATOGRAPHY (ERCP);  Surgeon: [name], MD;  Location: [facility];  Service: Endoscopy;  Laterality: N/A;  . EYE SURGERY Bilateral    Cataract removal  . FOOT  FRACTURE SURGERY    . FRACTURE SURGERY    . LUMBAR DISC SURGERY  08/06/2012   L3 & L4  . LUMBAR DISC SURGERY  11/11/2015   L1 L2    DR NITKA  . LUMBAR FUSION N/A 04/20/2015   Procedure: T12 to L1 fusion (Extension of Previous Fusion L2-S1 to T12-S1), Right Transforaminal lumbar interbody fusion, Posterior Fusion T12 to L1, with  Pedicle screws, allograft, local bone graft, and  Vivigen;  Surgeon: [name], MD;  Location: [facility];  Service: Orthopedics;  Laterality: N/A;  . LUMBAR LAMINECTOMY N/A 11/10/2013   Procedure: Left L1-2 far lateral approach to excise herniated nucleus pulposus;  Surgeon: [name], MD;  Location: [facility];  Service: Orthopedics;  Laterality: N/A;  . LUMBAR LAMINECTOMY/DECOMPRESSION MICRODISCECTOMY Left 08/10/2012   Procedure: Dura Repair Left Side L2-L3;  Surgeon: [name], MD;  Location: [facility];  Service: Orthopedics;  Laterality: Left;  Wilson Frame, Sliding table, dura repair kit, microscope  . NECK SURGERY     X 2  . SPINE SURGERY  2006   C-spine surgery x 2  . UPPER GASTROINTESTINAL ENDOSCOPY      Social History   Socioeconomic History  . Marital status: Single    Spouse name: Not on file  . Number of children: 1  . Years of education: Not on file  . Highest education level: Not on file  Occupational History  . Occupation: DISTRIBUTION MGR    Employer: [employer]  Social Needs  . Financial resource strain: Not on file  . Food insecurity:    Worry: Not on file    Inability: Not on file  . Transportation needs:    Medical: Not on file    Non-medical: Not on file  Tobacco Use  . Smoking status: Former Smoker    Packs/day: 0.50    Years: 44.00    Pack years: 22.00    Types: Cigarettes    Last attempt to quit: 04/07/2007    Years since quitting: 10.9  . Smokeless tobacco: Never Used  . Tobacco comment: pt has stopped smoking about 9 months now  Substance and Sexual Activity  . Alcohol use: No  . Drug use: No  . Sexual activity: Not Currently  Lifestyle  . Physical activity:    Days per week: Not on file    Minutes per session: Not on file  . Stress: Not on file  Relationships  . Social connections:    Talks on phone: Not on file    Gets together: Not on file    Attends religious service: Not on file    Active member of club or organization: Not  on file    Attends meetings of clubs or organizations: Not on file    Relationship status: Not on file  . Intimate partner violence:    Fear of current or ex partner: Not on file    Emotionally abused: Not on file    Physically abused: Not on file    Forced sexual activity: Not on file  Other Topics Concern  . Not on file  Social History Narrative   Works in Psychologist, educational. Retired.   Lives alone, has one child in [place].     Current Outpatient Medications on File Prior to Visit  Medication Sig Dispense Refill  . irbesartan (AVAPRO) 150 MG tablet Take 2 tablets (300 mg total) by mouth daily. 180 tablet 1  . levothyroxine (SYNTHROID, LEVOTHROID) 125 MCG tablet TAKE 1 TABLET  BY MOUTH EVERY DAY BEFORE BREAKFAST. 90 tablet 0  . metFORMIN (GLUCOPHAGE-XR) 500 MG 24 hr tablet Take 2 tablets (1,000 mg total) by mouth daily. 180 tablet 3  . metoprolol succinate (TOPROL-XL) 50 MG 24 hr tablet Take 1 tablet (50 mg total) by mouth daily. Please make overdue yearly appt with Dr. Burt Knack before anymore refills. 1st attempt 30 tablet 0  . pantoprazole (PROTONIX) 40 MG tablet TAKE 1 TABLET BY MOUTH TWICE A DAY BEFORE MEALS 60 tablet 0  . tamoxifen (NOLVADEX) 10 MG tablet Take 1 tablet (10 mg total) by mouth 2 (two) times daily. 30 tablet 11   No current facility-administered medications on file prior to visit.     Allergies  Allergen Reactions  . Ceftin Other (See Comments)    Patient stated it caused "sores in mouth" Thrush  . Cefuroxime Axetil Other (See Comments)    Sores in mouth    Family History  Problem Relation Age of Onset  . Cancer Mother        Breast Cancer, Uterine Cancer  . Breast cancer Mother   . Uterine cancer Mother   . Heart disease Mother   . Heart attack Mother   . Prostate cancer Father   . Hypertension Brother   . Stomach cancer Paternal Grandmother   . Thyroid disease Neg Hx   . Colon cancer Neg Hx   . Esophageal cancer Neg Hx   . Rectal cancer Neg Hx   .  Pancreatic cancer Neg Hx     BP 130/90 (BP Location: Right Arm)   Pulse 61   Ht _0  (1.803 m)   Wt 206 lb 6.4 oz (93.6 kg)   SpO2 97%   BMI 28.79 kg/m   REVIEW OF SYSTEMS: Denies sob PHYSICAL EXAMINATION: VITAL SIGNS:  See vs page GENERAL: no distress BREASTS: slight bilat gynecomastia.   LUNGS: clear to auscultation. IMPRESSION: CHF: well-controlled, but he needs inspra Gynecomastia, improved PLAN:  I rx'ed inspra, but he needs to see cardiol.   Check labs

## 2018-03-02 ENCOUNTER — Ambulatory Visit
Admission: RE | Admit: 2018-03-02 | Discharge: 2018-03-02 | Disposition: A | Discharge status: Home / Self Care | Payer: PPO | Source: Ambulatory Visit | Attending: Specialist | Admitting: Specialist

## 2018-03-02 DIAGNOSIS — M4325 Fusion of spine, thoracolumbar region: Secondary | ICD-10-CM

## 2018-03-02 DIAGNOSIS — R7989 Other specified abnormal findings of blood chemistry: Secondary | ICD-10-CM | POA: Insufficient documentation

## 2018-03-02 DIAGNOSIS — M792 Neuralgia and neuritis, unspecified: Secondary | ICD-10-CM

## 2018-03-02 DIAGNOSIS — M5124 Other intervertebral disc displacement, thoracic region: Secondary | ICD-10-CM | POA: Diagnosis not present

## 2018-03-02 LAB — TESTOSTERONE,FREE AND TOTAL
TESTOSTERONE FREE: 5.7 pg/mL — AB (ref 6.6–18.1)
Testosterone: 131 ng/dL — ABNORMAL LOW (ref 264–916)

## 2018-03-04 ENCOUNTER — Ambulatory Visit (INDEPENDENT_AMBULATORY_CARE_PROVIDER_SITE_OTHER): Payer: PPO | Admitting: Specialist

## 2018-03-07 LAB — ESTRADIOL, FREE
ESTRADIOL: 14 pg/mL
Estradiol, Free: 0.33 pg/mL

## 2018-03-13 ENCOUNTER — Ambulatory Visit (INDEPENDENT_AMBULATORY_CARE_PROVIDER_SITE_OTHER): Payer: PPO | Admitting: Specialist

## 2018-03-13 ENCOUNTER — Encounter (INDEPENDENT_AMBULATORY_CARE_PROVIDER_SITE_OTHER): Payer: Self-pay | Admitting: Specialist

## 2018-03-13 VITALS — BP 149/85 | HR 62 | Ht 71.0 in | Wt 209.0 lb

## 2018-03-13 DIAGNOSIS — Z981 Arthrodesis status: Secondary | ICD-10-CM

## 2018-03-13 DIAGNOSIS — M5134 Other intervertebral disc degeneration, thoracic region: Secondary | ICD-10-CM | POA: Diagnosis not present

## 2018-03-13 NOTE — Patient Instructions (Signed)
Avoid frequent bending and stooping  No lifting greater than 10 lbs. May use ice or moist heat for pain. Weight loss is of benefit. Best medication for lumbar disc disease is arthritis medications like motrin, celebrex and naprosyn. Exercise is important to improve your indurance and does allow people to function better inspite of back pain.   

## 2018-03-13 NOTE — Progress Notes (Signed)
Office Visit Note   Patient: Jeff Lopez           Date of Birth: 04/08/1951           MRN: [id number] Visit Date: 03/13/2018              Requested by: Renato Shin, MD [address] [place], [place] [address] PCP: Renato Shin, MD   Assessment & Plan: Visit Diagnoses:  1. S/P lumbar spinal fusion   2. Degenerative disc disease, thoracic     Plan:Avoid frequent bending and stooping  No lifting greater than 10 lbs. May use ice or moist heat for pain. Weight loss is of benefit. Best medication for lumbar disc disease is arthritis medications like motrin, celebrex and naprosyn. Exercise is important to improve your indurance and does allow people to function better inspite of back pain.  Follow-Up Instructions: Return in about 6 months (around 09/11/2018).   Orders:  No orders of the defined types were placed in this encounter.  No orders of the defined types were placed in this encounter.     Procedures: No procedures performed   Clinical Data: No additional findings.   Subjective: Chief Complaint  Patient presents with  . Middle Back - Pain, Follow-up    67 year old male with history of Lumbar fusion L3 to S1 with mild adjacent lumbar disease he has been having left lower abdomenal pain. Pain with sleeping of the left side. MRI report is negative for thoracic  Compressive findings. He stopped spironolactone and was on an antibiotic for a short period for possible left abdomenal pain due to a colitis or diverticular disease. He is going to change primary care soon. He had decreased his walking due to hip pain and left lower abdomenal.   Review of Systems  Constitutional: Negative.   HENT: Negative.   Eyes: Negative.   Respiratory: Negative.   Cardiovascular: Negative.   Gastrointestinal: Negative.   Endocrine: Negative.   Genitourinary: Negative.   Musculoskeletal: Negative.   Skin: Negative.   Allergic/Immunologic: Negative.     Neurological: Negative.   Hematological: Negative.   Psychiatric/Behavioral: Negative.      Objective: Vital Signs: BP (!) 149/85   Pulse 62   Wt 209 lb (94.8 kg)   BMI 29.15 kg/m   Physical Exam  Constitutional: He is oriented to person, place, and time. He appears well-developed and well-nourished.  HENT:  Head: Normocephalic and atraumatic.  Eyes: Pupils are equal, round, and reactive to light. EOM are normal.  Neck: Normal range of motion. Neck supple.  Pulmonary/Chest: Effort normal and breath sounds normal.  Abdominal: Soft. Bowel sounds are normal.  Neurological: He is alert and oriented to person, place, and time.  Skin: Skin is warm and dry.  Psychiatric: He has a normal mood and affect. His behavior is normal. Judgment and thought content normal.    Back Exam   Tenderness  The patient is experiencing tenderness in the lumbar.  Range of Motion  Extension: abnormal  Flexion: abnormal  Lateral bend right: abnormal  Lateral bend left: abnormal  Rotation right: abnormal  Rotation left: abnormal   Muscle Strength  Right Quadriceps:  5/5  Left Quadriceps:  5/5  Right Hamstrings:  5/5  Left Hamstrings:  5/5   Tests  Straight leg raise right: negative Straight leg raise left: negative  Reflexes  Patellar: Hyporeflexic Achilles: Hyporeflexic Babinski's sign: normal   Other  Toe walk: normal Heel walk: normal Sensation:  normal Gait: normal  Erythema: no back redness Scars: present      Specialty Comments:  No specialty comments available.  Imaging: No results found.   PMFS History: Patient Active Problem List   Diagnosis Date Noted  . Spinal stenosis, lumbar region, with neurogenic claudication 04/21/2015    Priority: High    Class: Chronic  . Spondylolisthesis of lumbar region 04/21/2015    Priority: High    Class: Chronic  . Postoperative CSF leak 08/10/2012    Priority: High    Class: Acute  . HNP (herniated nucleus pulposus),  lumbar 08/06/2012    Priority: High    Class: Acute  . Low testosterone 03/02/2018  . Gynecomastia 02/28/2018  . Left lower quadrant pain 12/26/2017  . Dyspnea 10/03/2017  . Arthralgia 10/03/2017  . Numbness 10/03/2017  . Chronic pansinusitis 01/09/2017  . Deviated septum 01/09/2017  . Diaphoresis 01/04/2017  . Acute pansinusitis 11/29/2016  . Chronic left sacroiliac pain 10/19/2016  . Chronic left-sided low back pain with left-sided sciatica 07/31/2016  . Hypokalemia 10/14/2015  . Hypocalcemia 10/14/2015  . Hypothyroidism following radioiodine therapy 03/10/2015  . Screening for prostate cancer 03/10/2015  . Hemochromatosis 03/27/2014  . Other abnormal blood chemistry 02/12/2014  . Choledocholithiasis 07/14/2013  . Nonspecific (abnormal) findings on radiological and other examination of biliary tract 07/13/2013  . Calculus of gallbladder with acute cholecystitis, without mention of obstruction 07/13/2013  . Calculus of bile duct without mention of cholecystitis or obstruction 07/13/2013  . Abdominal pain 07/12/2013  . Neck pain on left side 05/30/2013  . Quit smoking 11/28/2012  . Degenerative disc disease, lumbar 08/06/2012    Class: Chronic  . Diabetes ([place]) 06/10/2012  . COPD GOLD 0 02/09/2012  . Chest pain at rest 02/06/2012  . Allergic rhinitis 02/06/2012  . Wellness examination 08/12/2011  . Radiculopathy of leg 11/05/2010  . Cough 08/23/2010  . RENAL CYST 05/27/2010  . LUMBAR DISC DISORDER 05/27/2010  . HYPERTENSION, BENIGN 04/19/2010  . CAD, NATIVE VESSEL 04/19/2010  . HYPERLIPIDEMIA 04/09/2007  . GERD 04/09/2007  . OSTEOARTHRITIS, LUMBAR SPINE 04/09/2007   Past Medical History:  Diagnosis Date  . CAD, NATIVE VESSEL 04/19/2010   nonobstructive by cath 2007:  oLAD 20-30%, mLAD 50%, pCFX 20-30%, oAVCFX 20-30%, L renal art 50%;  normal LVF  . Cataract    bil cataracts removed  . COPD (chronic obstructive pulmonary disease) ([place])   . Diabetes mellitus without  complication ([place])   . GERD 04/09/2007  . Headache(784.0)   . HYPERLIPIDEMIA 04/09/2007   Patient denies.  [name] HYPERTENSION, BENIGN 04/19/2010  . Hypothyroidism   . Hypothyroidism    previous hyperthyroidism, s/p I-131  . LUMBAR DISC DISORDER 05/27/2010  . Nerve damage    Left leg  . OSTEOARTHRITIS, LUMBAR SPINE 04/09/2007  . RENAL CYST 05/27/2010  . Spinal headache    After having back surgery in 2014    Family History  Problem Relation Age of Onset  . Cancer Mother        Breast Cancer, Uterine Cancer  . Breast cancer Mother   . Uterine cancer Mother   . Heart disease Mother   . Heart attack Mother   . Prostate cancer Father   . Hypertension Brother   . Stomach cancer Paternal Grandmother   . Thyroid disease Neg Hx   . Colon cancer Neg Hx   . Esophageal cancer Neg Hx   . Rectal cancer Neg Hx   . Pancreatic cancer Neg Hx  Past Surgical History:  Procedure Laterality Date  . BACK SURGERY  2012,2014  . CARDIAC CATHETERIZATION  2007   Dr Loanne Drilling  . CHOLECYSTECTOMY    . CHOLECYSTECTOMY N/A 07/13/2013   Procedure: LAPAROSCOPIC CHOLECYSTECTOMY WITH INTRAOPERATIVE CHOLANGIOGRAM;  Surgeon: Shann Medal, MD;  [facility];  Service: General;  Laterality: N/A;  . COLONOSCOPY    . COLONOSCOPY W/ POLYPECTOMY    . ERCP N/A 07/14/2013   Procedure: ENDOSCOPIC RETROGRADE CHOLANGIOPANCREATOGRAPHY (ERCP);  Surgeon: [name], MD;  Location: [facility];  Service: Endoscopy;  Laterality: N/A;  . EYE SURGERY Bilateral    Cataract removal  . FOOT FRACTURE SURGERY    . FRACTURE SURGERY    . LUMBAR DISC SURGERY  08/06/2012   L3 & L4  . LUMBAR DISC SURGERY  11/11/2015   L1 L2    DR NITKA  . LUMBAR FUSION N/A 04/20/2015   Procedure: T12 to L1 fusion (Extension of Previous Fusion L2-S1 to T12-S1), Right Transforaminal lumbar interbody fusion, Posterior Fusion T12 to L1, with Pedicle screws, allograft, local bone graft, and  Vivigen;  Surgeon: [name], MD;  Location: [facility];  Service: Orthopedics;  Laterality: N/A;  . LUMBAR LAMINECTOMY N/A 11/10/2013   Procedure: Left L1-2 far lateral approach to excise herniated nucleus pulposus;  Surgeon: [name], MD;  Location: [facility];  Service: Orthopedics;  Laterality: N/A;  . LUMBAR LAMINECTOMY/DECOMPRESSION MICRODISCECTOMY Left 08/10/2012   Procedure: Dura Repair Left Side L2-L3;  Surgeon: [name], MD;  Location: [facility];  Service: Orthopedics;  Laterality: Left;  Wilson Frame, Sliding table, dura repair kit, microscope  . NECK SURGERY     X 2  . SPINE SURGERY  2006   C-spine surgery x 2  . UPPER GASTROINTESTINAL ENDOSCOPY     Social History   Occupational History  . 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 Smoking status: Former Smoker    Packs/day: 0.50    Years: 44.00    Pack years: 22.00    Types: Cigarettes    Last attempt to quit: 04/07/2007    Years since quitting: 10.9  . Smokeless tobacco: Never Used  . Tobacco comment: pt has stopped smoking about 9 months now  Substance and Sexual Activity  . Alcohol use: No  . Drug use: No  . Sexual activity: Not Currently

## 2018-03-26 ENCOUNTER — Other Ambulatory Visit: Payer: Self-pay | Admitting: Cardiovascular Disease

## 2018-03-27 ENCOUNTER — Telehealth: Payer: Self-pay | Admitting: Cardiovascular Disease

## 2018-03-27 ENCOUNTER — Encounter: Payer: Self-pay | Admitting: Gastroenterology

## 2018-03-27 ENCOUNTER — Ambulatory Visit: Payer: PPO | Admitting: Gastroenterology

## 2018-03-27 ENCOUNTER — Ambulatory Visit (INDEPENDENT_AMBULATORY_CARE_PROVIDER_SITE_OTHER): Payer: PPO | Admitting: Specialist

## 2018-03-27 VITALS — BP 152/96 | HR 72 | Ht 68.75 in | Wt 208.4 lb

## 2018-03-27 DIAGNOSIS — Z86010 Personal history of colonic polyps: Secondary | ICD-10-CM

## 2018-03-27 DIAGNOSIS — K219 Gastro-esophageal reflux disease without esophagitis: Secondary | ICD-10-CM

## 2018-03-27 MED ORDER — FAMOTIDINE 20 MG PO TABS: 20.0000 mg | ORAL_TABLET | Freq: Every day | ORAL | 11 refills | Status: DC

## 2018-03-27 MED ORDER — PANTOPRAZOLE SODIUM 40 MG PO TBEC: 40.0000 mg | DELAYED_RELEASE_TABLET | Freq: Every day | ORAL | 11 refills | Status: DC

## 2018-03-27 NOTE — Patient Instructions (Addendum)
Refills of protonix 40mg  pills, one pill once daily, take 20-30 min before your BF meal. Disp one month with 11 refills. Also start pepcid (famotidine) 20mg  pill, one pill at bedtime nightly.  Call in 3-4 weeks to report on this change in your medicines.  Normal BMI (Body Mass Index- based on height and weight) is between 19 and 25. Your BMI today is Body mass index is 31 kg/m. [name] Please consider follow up  regarding your BMI with your Primary Care Provider.  Thank you for entrusting me with your care and choosing [facility].  Dr Ardis Hughs

## 2018-03-27 NOTE — Telephone Encounter (Signed)
Ok - this is fine with me. Issue with care or access?

## 2018-03-27 NOTE — Progress Notes (Signed)
Review of pertinent gastrointestinal problems:  1. GERD, intermittent dysphagia that is likely GERD related. EGD February 2010 found 2 cm hiatal hernia, no stricture, slightly irregular Z line that was biopsied and biopsies showed no sign of intestinal metaplasia.  2. Adenomatous polyps: colonoscopy with SML in 03/2004 found 45m HP polyp. Repeat colonoscopy 2013 (Taevon Aschoff), done for new bleeding found two polyps, hemorrhoids. Polyps were adenomas on pathology. Recommended recall colonoscopy at 5 year interval. Colonoscopy Dr. JArdis Hughs3/2018: six subCM polyps removed, 5 were adenomas, recommended 3 year recall . 3. Gallstone disease: acute cholecystitis, he underwent laparoscopic cholecystectomy January 2015. The intraoperative cholangiogram suggested filling defects in the common bile duct. ERCP done by Dr. JArdis HughsJanuary 2015 performed sphincterotomy and stone removal went smoothly.   HPI: This is a very pleasant 67year old man whom I last saw at the time of a colonoscopy about a year and a half ago.  He is here today for Protonix refills for his chronic GERD  Chief complaint is chronic GERD  Has intermittent loose stools.  A lot diet related.  He is on Glucophage daily and wants to get off of it.  Urinates at night a lot.  Talked a lot about side effects of his medicines.  As long as he takes his Protonix twice daily he has 0 GERD symptoms.  He is not bothered by dysphasia.  He has no unexplained weight loss or overt GI bleeding  ROS: complete GI ROS as described in HPI, all other review negative.  Constitutional:  No unintentional weight loss  His weight is up about 16 pounds compared to 2-1/2 years ago, same scale here in our office   Past Medical History:  Diagnosis Date  . CAD, NATIVE VESSEL 04/19/2010   nonobstructive by cath 2007:  oLAD 20-30%, mLAD 50%, pCFX 20-30%, oAVCFX 20-30%, L renal art 50%;  normal LVF  . Cataract    bil cataracts removed  . COPD (chronic obstructive  pulmonary disease) ([place]   . [place] complication [place]   . GERD 04/09/2007  . Headache(784.0)   . HYPERLIPIDEMIA 04/09/2007   Patient denies.  .Marland KitchenHYPERTENSION, BENIGN 04/19/2010  . Hypothyroidism   . Hypothyroidism    previous hyperthyroidism, s/p I-131  . LUMBAR DISC DISORDER 05/27/2010  . Nerve damage    Left leg  . OSTEOARTHRITIS, LUMBAR SPINE 04/09/2007  . RENAL CYST 05/27/2010  . Spinal headache    After having back surgery in 2014    Past Surgical History:  Procedure Laterality Date  . BACK SURGERY  2012,2014  . CARDIAC CATHETERIZATION  2007   Dr ELoanne Drilling . CHOLECYSTECTOMY    . CHOLECYSTECTOMY N/A 07/13/2013   Procedure: LAPAROSCOPIC CHOLECYSTECTOMY WITH INTRAOPERATIVE CHOLANGIOGRAM;  Surgeon: DShann Medal MD;  Location: [facility];  Service: General;  Laterality: N/A;  . COLONOSCOPY    . COLONOSCOPY W/ POLYPECTOMY    . ERCP N/A 07/14/2013   Procedure: ENDOSCOPIC RETROGRADE CHOLANGIOPANCREATOGRAPHY (ERCP);  Surgeon: DMilus Banister MD;  [facility];  Service: Endoscopy;  Laterality: N/A;  . EYE SURGERY Bilateral    Cataract removal  . FOOT FRACTURE SURGERY    . FRACTURE SURGERY    . LUMBAR DISC SURGERY  08/06/2012   L3 & L4  . LUMBAR DISC SURGERY  11/11/2015   L1 L2    DR NITKA  . LUMBAR FUSION N/A 04/20/2015   Procedure: T12 to L1 fusion (Extension of Previous Fusion L2-S1 to T12-S1), Right Transforaminal lumbar interbody fusion, Posterior Fusion  T12 to L1, with Pedicle screws, allograft, local bone graft, and  Vivigen;  Surgeon: [name], MD;  Location: [place];  Service: Orthopedics;  Laterality: N/A;  . LUMBAR LAMINECTOMY N/A 11/10/2013   Procedure: Left L1-2 far lateral approach to excise herniated nucleus pulposus;  Surgeon: [name], MD;  Location: [facility] [facility];  Service: Orthopedics;  Laterality: N/A;  . LUMBAR LAMINECTOMY/DECOMPRESSION MICRODISCECTOMY Left 08/10/2012   Procedure: Dura Repair Left Side L2-L3;  Surgeon: [name],  MD;  Location: [place];  Service: Orthopedics;  Laterality: Left;  Wilson Frame, Sliding table, dura repair kit, microscope  . NECK SURGERY     X 2  . SPINE SURGERY  2006   C-spine surgery x 2  . UPPER GASTROINTESTINAL ENDOSCOPY      Current Outpatient Medications  Medication Sig Dispense Refill  . irbesartan (AVAPRO) 150 MG tablet Take 2 tablets (300 mg total) by mouth daily. 180 tablet 1  . levothyroxine (SYNTHROID, LEVOTHROID) 125 MCG tablet TAKE 1 TABLET BY MOUTH EVERY DAY BEFORE BREAKFAST. 90 tablet 0  . metFORMIN (GLUCOPHAGE-XR) 500 MG 24 hr tablet Take 2 tablets (1,000 mg total) by mouth daily. 180 tablet 3  . metoprolol succinate (TOPROL-XL) 50 MG 24 hr tablet Take 1 tablet (50 mg total) by mouth daily. Please make overdue yearly appt with Dr. Burt Knack before anymore refills. 1st attempt 30 tablet 0  . pantoprazole (PROTONIX) 40 MG tablet TAKE 1 TABLET BY MOUTH TWICE A DAY BEFORE MEALS 60 tablet 0  . tamoxifen (NOLVADEX) 10 MG tablet Take 10 mg by mouth 2 (two) times daily.    [name] eplerenone (INSPRA) 25 MG tablet Take 1 tablet (25 mg total) by mouth 2 (two) times daily. (Patient not taking: Reported on 03/27/2018) 60 tablet 11   No current facility-administered medications for this visit.     Allergies as of 03/27/2018 - Review Complete 03/27/2018  Allergen Reaction Noted  . Ceftin Other (See Comments) 07/31/2011  . Cefuroxime axetil Other (See Comments) 11/29/2016    Family History  Problem Relation Age of Onset  . Breast cancer Mother   . Uterine cancer Mother   . Heart disease Mother   . Heart attack Mother   . Prostate cancer Father   . Hypertension Brother   . Non-Hodgkin's lymphoma Brother   . Stomach cancer Paternal Grandmother   . Thyroid disease Neg Hx   . Colon cancer Neg Hx   . Esophageal cancer Neg Hx   . Rectal cancer Neg Hx   . Pancreatic cancer Neg Hx     Social History   Socioeconomic History  . Marital status: Single    Spouse name: Not on file  .  Number of children: 1  . Years of education: Not on file  . Highest education level: Not on file  Occupational History  . Occupation: DISTRIBUTION MGR    Employer: [employer]  Social Needs  . Financial resource strain: Not on file  . Food insecurity:    Worry: Not on file    Inability: Not on file  . Transportation needs:    Medical: Not on file    Non-medical: Not on file  Tobacco Use  . Smoking status: Former Smoker    Packs/day: 0.50    Years: 44.00    Pack years: 22.00    Types: Cigarettes    Last attempt to quit: 04/07/2007    Years since quitting: 10.9  . Smokeless tobacco: Never Used  . Tobacco  comment: pt has stopped smoking about 9 months now  Substance and Sexual Activity  . Alcohol use: No  . Drug use: No  . Sexual activity: Not Currently  Lifestyle  . Physical activity:    Days per week: Not on file    Minutes per session: Not on file  . Stress: Not on file  Relationships  . Social connections:    Talks on phone: Not on file    Gets together: Not on file    Attends religious service: Not on file    Active member of club or organization: Not on file    Attends meetings of clubs or organizations: Not on file    Relationship status: Not on file  . Intimate partner violence:    Fear of current or ex partner: Not on file    Emotionally abused: Not on file    Physically abused: Not on file    Forced sexual activity: Not on file  Other Topics Concern  . Not on file  Social History Narrative   Works in Psychologist, educational. Retired.   Lives alone, has one child in [place].      Physical Exam: BP (!) 152/96 (BP Location: Left Arm, Patient Position: Sitting, Cuff Size: Normal)   Pulse 72   Ht 5' 8.75" (1.746 m) Comment: height measured without shoes  Wt 208 lb 6 oz (94.5 kg)   BMI 31.00 kg/m  Constitutional: generally well-appearing Psychiatric: alert and oriented x3 Abdomen: soft, nontender, nondistended, no obvious ascites, no peritoneal signs,  normal bowel sounds No peripheral edema noted in lower extremities  Assessment and plan: 67 y.o. male with chronic GERD  His symptoms are well controlled on Protonix twice daily.  We talked a lot about different medication side effects and asked him to try decreasing his Protonix to once daily.  He will also start taking a H2 blocker famotidine at bedtime every night and he will call to report on his response to this medication change in 3 or 4 weeks.  I am happy to refill his proton pump inhibitor again in 1 year but if he still requires prescription strength proton pump inhibitor in 2 years I would like to see him again in the office.  Please see the "Patient Instructions" section for addition details about the plan.  Owens Loffler, MD [facility] 03/27/2018, 9:54 AM

## 2018-03-27 NOTE — Telephone Encounter (Signed)
New message    Patient would like to switch from Dr Burt Knack to another cardiologist

## 2018-03-28 NOTE — Telephone Encounter (Signed)
He's a nice guy. I'd be fine with anyone in the practice seeing him. Probably best to get him in with whoever has best availability.

## 2018-03-28 NOTE — Telephone Encounter (Signed)
Apologized to [name] for inconvenience and frustration with scheduling an appointment. Scheduled the patient tomorrow with Dr. Burt Knack. He understands he can discuss switching providers to find best fit at appointment tomorrow if he so decides. He was grateful for assistance.

## 2018-03-29 ENCOUNTER — Ambulatory Visit: Payer: PPO | Admitting: Cardiovascular Disease

## 2018-03-29 ENCOUNTER — Encounter: Payer: Self-pay | Admitting: Cardiovascular Disease

## 2018-03-29 VITALS — BP 140/100 | HR 82 | Ht 68.75 in | Wt 208.2 lb

## 2018-03-29 DIAGNOSIS — N183 Chronic kidney disease, stage 3 unspecified: Secondary | ICD-10-CM

## 2018-03-29 DIAGNOSIS — R0602 Shortness of breath: Secondary | ICD-10-CM | POA: Diagnosis not present

## 2018-03-29 DIAGNOSIS — I10 Essential (primary) hypertension: Secondary | ICD-10-CM

## 2018-03-29 DIAGNOSIS — I2510 Atherosclerotic heart disease of native coronary artery without angina pectoris: Secondary | ICD-10-CM

## 2018-03-29 MED ORDER — AMLODIPINE BESYLATE 5 MG PO TABS: 5.0000 mg | ORAL_TABLET | Freq: Every day | ORAL | 3 refills | Status: DC

## 2018-03-29 NOTE — Patient Instructions (Signed)
Medication Instructions:  1) START AMLODIPINE 5 mg daily  Labwork: None  Testing/Procedures: Your provider has requested that you have a lexiscan myoview. For further information please visit [email or web address]. Please follow instruction sheet, as given.  Follow-Up: Your provider recommends that you schedule a follow-up appointment in 1 YEAR with Dr. Burt Knack. You will be called to arrange appointment when Dr. Antionette Char schedule opens.   Any Other Special Instructions Will Be Listed Below (If Applicable).     If you need a refill on your cardiac medications before your next appointment, please call your pharmacy.

## 2018-03-29 NOTE — Progress Notes (Signed)
Cardiology Office Note:    Date:  03/29/2018   ID:  Jeff Lopez, DOB Nov 19, 1950, MRN [id number]  PCP:  Renato Shin, MD  Cardiologist:  Sherren Mocha, MD  Electrophysiologist:  None   Referring MD: Renato Shin, MD   Chief Complaint  Patient presents with  . Shortness of Breath    History of Present Illness:    Jeff Lopez is a 67 y.o. male with a hx of nonobstructive coronary artery disease, hypertension, COPD, diabetes, and hyperlipidemia.  The patient is here alone today.  He has had trouble getting access for a follow-up appointments.  He had problems with blood pressure control last year.  He was managed through the hypertension clinic.  Spironolactone was added to his medical regimen but he developed painful gynecomastia.  He is discontinued Spironolactone and currently taking irbesartan and metoprolol succinate.  States that his blood pressure has been poorly controlled with readings consistently greater than 140/90.  The patient has had no chest pain or pressure.  He does complain of exertional dyspnea with low to moderate level activity.  He denies orthopnea or PND.  He complains of diarrhea that he attributes to metformin.  Also complains of generalized fatigue.  Past Medical History:  Diagnosis Date  . CAD, NATIVE VESSEL 04/19/2010   nonobstructive by cath 2007:  oLAD 20-30%, mLAD 50%, pCFX 20-30%, oAVCFX 20-30%, L renal art 50%;  normal LVF  . Cataract    bil cataracts removed  . COPD (chronic obstructive pulmonary disease) ([place])   . Diabetes mellitus without complication ([place])   . GERD 04/09/2007  . Headache(784.0)   . HYPERLIPIDEMIA 04/09/2007   Patient denies.  [name] HYPERTENSION, BENIGN 04/19/2010  . Hypothyroidism   . Hypothyroidism    previous hyperthyroidism, s/p I-131  . LUMBAR DISC DISORDER 05/27/2010  . Nerve damage    Left leg  . OSTEOARTHRITIS, LUMBAR SPINE 04/09/2007  . RENAL CYST 05/27/2010  . Spinal headache    After having back surgery  in 2014    Past Surgical History:  Procedure Laterality Date  . BACK SURGERY  2012,2014  . CARDIAC CATHETERIZATION  2007   Dr Loanne Drilling  . CHOLECYSTECTOMY    . CHOLECYSTECTOMY N/A 07/13/2013   Procedure: LAPAROSCOPIC CHOLECYSTECTOMY WITH INTRAOPERATIVE CHOLANGIOGRAM;  Surgeon: Shann Medal, MD;  [facility]: [facility];  Service: General;  Laterality: N/A;  . COLONOSCOPY    . COLONOSCOPY W/ POLYPECTOMY    . ERCP N/A 07/14/2013   Procedure: ENDOSCOPIC RETROGRADE CHOLANGIOPANCREATOGRAPHY (ERCP);  Surgeon: [name], MD;  [facility];  Service: Endoscopy;  Laterality: N/A;  . EYE SURGERY Bilateral    Cataract removal  . FOOT FRACTURE SURGERY    . FRACTURE SURGERY    . LUMBAR DISC SURGERY  08/06/2012   L3 & L4  . LUMBAR DISC SURGERY  11/11/2015   L1 L2    DR NITKA  . LUMBAR FUSION N/A 04/20/2015   Procedure: T12 to L1 fusion (Extension of Previous Fusion L2-S1 to T12-S1), Right Transforaminal lumbar interbody fusion, Posterior Fusion T12 to L1, with Pedicle screws, allograft, local bone graft, and  Vivigen;  Surgeon: [name], MD;  Location: [facility];  Service: Orthopedics;  Laterality: N/A;  . LUMBAR LAMINECTOMY N/A 11/10/2013   Procedure: Left L1-2 far lateral approach to excise herniated nucleus pulposus;  Surgeon: [name], MD;  Location: [facility];  Service: Orthopedics;  Laterality: N/A;  . LUMBAR LAMINECTOMY/DECOMPRESSION MICRODISCECTOMY Left 08/10/2012   Procedure: Dura Repair Left  Side L2-L3;  Surgeon: [name], MD;  Location: [facility];  Service: Orthopedics;  Laterality: Left;  Wilson Frame, Sliding table, dura repair kit, microscope  . NECK SURGERY     X 2  . SPINE SURGERY  2006   C-spine surgery x 2  . UPPER GASTROINTESTINAL ENDOSCOPY      Current Medications: Current Meds  Medication Sig  . irbesartan (AVAPRO) 150 MG tablet Take 2 tablets (300 mg total) by mouth daily.  [name] levothyroxine (SYNTHROID, LEVOTHROID) 125 MCG tablet TAKE 1 TABLET BY MOUTH EVERY DAY  BEFORE BREAKFAST.  . metFORMIN (GLUCOPHAGE-XR) 500 MG 24 hr tablet Take 2 tablets (1,000 mg total) by mouth daily.  . metoprolol succinate (TOPROL-XL) 50 MG 24 hr tablet Take 1 tablet (50 mg total) by mouth daily. Please make overdue yearly appt with Dr. Burt Knack before anymore refills. 1st attempt  . pantoprazole (PROTONIX) 40 MG tablet TAKE 1 TABLET BY MOUTH TWICE A DAY BEFORE MEALS  . tamoxifen (NOLVADEX) 10 MG tablet Take 10 mg by mouth 2 (two) times daily.     Allergies:   Ceftin and Cefuroxime axetil   Social History   Socioeconomic History  . Marital status: Single    Spouse name: Not on file  . Number of children: 1  . Years of education: Not on file  . Highest education level: Not on file  Occupational History  . Occupation: DISTRIBUTION MGR    Employer: [employer]  Social Needs  . Financial resource strain: Not on file  . Food insecurity:    Worry: Not on file    Inability: Not on file  . Transportation needs:    Medical: Not on file    Non-medical: Not on file  Tobacco Use  . Smoking status: Former Smoker    Packs/day: 0.50    Years: 44.00    Pack years: 22.00    Types: Cigarettes    Last attempt to quit: 04/07/2007    Years since quitting: 10.9  . Smokeless tobacco: Never Used  . Tobacco comment: pt has stopped smoking about 9 months now  Substance and Sexual Activity  . Alcohol use: No  . Drug use: No  . Sexual activity: Not Currently  Lifestyle  . Physical activity:    Days per week: Not on file    Minutes per session: Not on file  . Stress: Not on file  Relationships  . Social connections:    Talks on phone: Not on file    Gets together: Not on file    Attends religious service: Not on file    Active member of club or organization: Not on file    Attends meetings of clubs or organizations: Not on file    Relationship status: Not on file  Other Topics Concern  . Not on file  Social History Narrative   Works in Psychologist, educational. Retired.    Lives alone, has one child in [place].      Family History: [name] family history includes Breast cancer in his mother; Heart attack in his mother; Heart disease in his mother; Hypertension in his brother; Non-Hodgkin's lymphoma in his brother; Prostate cancer in his father; Stomach cancer in his paternal grandmother; Uterine cancer in his mother. There is no history of Thyroid disease, Colon cancer, Esophageal cancer, Rectal cancer, or Pancreatic cancer.  ROS:   Please see the history of present illness.    Positive for excessive sweating, excessive fatigue, headaches, rash, easy bruising, muscle pain, back  pain, diarrhea.  All other systems reviewed and are negative.  EKGs/Labs/Other Studies Reviewed:    The following studies were reviewed today: Echo 09/20/2015: Study Conclusions  - Left ventricle: The cavity size was normal. Wall thickness was normal. Systolic function was normal. The estimated ejection fraction was in the range of 55% to 60%. Wall motion was normal; there were no regional wall motion abnormalities. Doppler parameters are consistent with abnormal left ventricular relaxation (grade 1 diastolic dysfunction). - Aortic valve: There was mild regurgitation. - Mitral valve: Calcified annulus. - Left atrium: The atrium was mildly dilated.  Impressions:  - Normal LV systolic function; grade 1 diastolic dysfunction; mild AI; mild LAE.  EKG:  EKG is not ordered today.    Recent Labs: 10/03/2017: Pro B Natriuretic peptide (BNP) 36.0; TSH 3.16 12/26/2017: BUN 23; Creatinine, Ser 1.37; Hemoglobin 14.9; Platelets 180.0; Potassium 4.0; Sodium 139  Recent Lipid Panel    Component Value Date/Time   CHOL 150 06/05/2016 1037   TRIG 135.0 06/05/2016 1037   HDL 41.30 06/05/2016 1037   CHOLHDL 4 06/05/2016 1037   VLDL 27.0 06/05/2016 1037   LDLCALC 82 06/05/2016 1037   LDLDIRECT 115.6 11/28/2012 0947    Physical Exam:    VS:  BP (!) 140/100   Pulse  82   Ht 5' 8.75" (1.746 m)   Wt 208 lb 3.2 oz (94.4 kg)   SpO2 94%   BMI 30.97 kg/m     Wt Readings from Last 3 Encounters:  03/29/18 208 lb 3.2 oz (94.4 kg)  03/27/18 208 lb 6 oz (94.5 kg)  03/13/18 209 lb (94.8 kg)     GEN:  Well nourished, well developed in no acute distress HEENT: Normal NECK: No JVD; No carotid bruits LYMPHATICS: No lymphadenopathy CARDIAC: RRR, no murmurs, rubs, gallops RESPIRATORY:  Clear to auscultation without rales, wheezing or rhonchi  ABDOMEN: Soft, non-tender, non-distended MUSCULOSKELETAL:  No edema; No deformity  SKIN: Warm and dry NEUROLOGIC:  Alert and oriented x 3 PSYCHIATRIC:  Normal affect   ASSESSMENT:    1. CAD in native artery   2. Shortness of breath   3. Essential hypertension   4. CKD (chronic kidney disease) stage 3, GFR 30-59 ml/min (HCC)    PLAN:    In order of problems listed above:  1. [name] cardiac catheterization report is reviewed from many years ago.  At the time he had moderate LAD stenosis of 50%.  He is having shortness of breath with exertion and I think he should be evaluated for myocardial ischemia with a Lexiscan Myoview stress test.  He will continue on his current medications. 2. Suspect multifactorial.  The patient has gained weight.  Myocardial ischemia will be evaluated with stress testing.  His last echocardiogram from 2017 is reviewed and demonstrated normal LV systolic function.  We discussed weight loss measures at length and he understands this is an important part of his care moving forward.  He really wants to minimize medications but I do not think he will get off of hypoglycemics or antihypertensive medications without significant weight loss. 3. Blood pressure control is suboptimal.  Lifestyle modification reviewed at length.  Amlodipine 5 mg added to his medical regimen. 4. Stable.    Medication Adjustments/Labs and Tests Ordered: Current medicines are reviewed at length with the patient  today.  Concerns regarding medicines are outlined above.  Orders Placed This Encounter  Procedures  . MYOCARDIAL PERFUSION IMAGING   Meds ordered this encounter  Medications  .  amLODipine (NORVASC) 5 MG tablet    Sig: Take 1 tablet (5 mg total) by mouth daily.    Dispense:  180 tablet    Refill:  3    Patient Instructions  Medication Instructions:  1) START AMLODIPINE 5 mg daily  Labwork: None  Testing/Procedures: Your provider has requested that you have a lexiscan myoview. For further information please visit [email or web address]. Please follow instruction sheet, as given.  Follow-Up: Your provider recommends that you schedule a follow-up appointment in 1 YEAR with Dr. Burt Knack. You will be called to arrange appointment when Dr. Antionette Char schedule opens.   Any Other Special Instructions Will Be Listed Below (If Applicable).     If you need a refill on your cardiac medications before your next appointment, please call your pharmacy.      Signed, Sherren Mocha, MD  03/29/2018 5:34 PM    [place]

## 2018-04-04 ENCOUNTER — Other Ambulatory Visit: Payer: PPO

## 2018-04-04 ENCOUNTER — Encounter: Payer: Self-pay | Admitting: Family Medicine

## 2018-04-04 ENCOUNTER — Ambulatory Visit (INDEPENDENT_AMBULATORY_CARE_PROVIDER_SITE_OTHER): Payer: PPO | Admitting: Family Medicine

## 2018-04-04 VITALS — BP 122/64 | HR 65 | Temp 97.6°F | Ht 69.0 in | Wt 208.2 lb

## 2018-04-04 DIAGNOSIS — E1169 Type 2 diabetes mellitus with other specified complication: Secondary | ICD-10-CM

## 2018-04-04 DIAGNOSIS — G4700 Insomnia, unspecified: Secondary | ICD-10-CM | POA: Diagnosis not present

## 2018-04-04 DIAGNOSIS — Z23 Encounter for immunization: Secondary | ICD-10-CM | POA: Diagnosis not present

## 2018-04-04 DIAGNOSIS — I25119 Atherosclerotic heart disease of native coronary artery with unspecified angina pectoris: Secondary | ICD-10-CM

## 2018-04-04 DIAGNOSIS — I10 Essential (primary) hypertension: Secondary | ICD-10-CM | POA: Diagnosis not present

## 2018-04-04 DIAGNOSIS — E118 Type 2 diabetes mellitus with unspecified complications: Secondary | ICD-10-CM | POA: Diagnosis not present

## 2018-04-04 DIAGNOSIS — E785 Hyperlipidemia, unspecified: Secondary | ICD-10-CM

## 2018-04-04 MED ORDER — GLUCOPHAGE XR 500 MG PO TB24: 500.0000 mg | ORAL_TABLET | Freq: Every day | ORAL | 5 refills | Status: DC

## 2018-04-04 NOTE — Patient Instructions (Addendum)
Check blood pressures at home and record so they have a baseline to compare to.   Work on cutting out excess carbohydrates and calories. Exercise daily is important.   Why is Exercise Important? If I told you I had a single pill that would help you decrease stress by improving anxiety, decreasing depression, help you achieve a healthy weight, give you more energy, make you more productive, help you focus, decrease your risk of dementia/heart attack/stroke/falls, improve your bone health, and more would you be interested? These are just some of the benefits that exercise brings to you. IT IS WORTH carving out some time every day to fit in exercise. It will help in every aspect of your health. Even if you have injuries that prevent you from participating in a type of exercise you used to do; there is always something that you can do to keep exercise a part of your life. If improving your health is important, make exercise your priority. It is worth the time! If you have questions about the type of exercise that is right for you, please talk with me about this!     Exercising to Stay Healthy  Exercising regularly is important. It has many health benefits, such as:  Improving your overall fitness, flexibility, and endurance.  Increasing your bone density.  Helping with weight control.  Decreasing your body fat.  Increasing your muscle strength.  Reducing stress and tension.  Improving your overall health.   In order to become healthy and stay healthy, it is recommended that you do moderate-intensity and vigorous-intensity exercise. You can tell that you are exercising at a moderate intensity if you have a higher heart rate and faster breathing, but you are still able to hold a conversation. You can tell that you are exercising at a vigorous intensity if you are breathing much harder and faster and cannot hold a conversation while exercising. How often should I exercise? Choose an activity  that you enjoy and set realistic goals. Your health care provider can help you to make an activity plan that works for you. Exercise regularly as directed by your health care provider. This may include:  Doing resistance training twice each week, such as: ? Push-ups. ? Sit-ups. ? Lifting weights. ? Using resistance bands.  Doing a given intensity of exercise for a given amount of time. Choose from these options: ? 150 minutes of moderate-intensity exercise every week. ? 75 minutes of vigorous-intensity exercise every week. ? A mix of moderate-intensity and vigorous-intensity exercise every week.   Children, pregnant women, people who are out of shape, people who are overweight, and older adults may need to consult a health care provider for individual recommendations. If you have any sort of medical condition, be sure to consult your health care provider before starting a new exercise program. What are some exercise ideas? Some moderate-intensity exercise ideas include:  Walking at a rate of 1 mile in 15 minutes.  Biking.  Hiking.  Golfing.  Dancing.   Some vigorous-intensity exercise ideas include:  Walking at a rate of at least 4.5 miles per hour.  Jogging or running at a rate of 5 miles per hour.  Biking at a rate of at least 10 miles per hour.  Lap swimming.  Roller-skating or in-line skating.  Cross-country skiing.  Vigorous competitive sports, such as football, basketball, and soccer.  Jumping rope.  Aerobic dancing.   What are some everyday activities that can help me to get exercise?  Yard work,  such as: ? Pushing a Conservation officer, nature. ? Raking and bagging leaves.  Washing and waxing your car.  Pushing a stroller.  Shoveling snow.  Gardening.  Washing windows or floors. How can I be more active in my day-to-day activities?  Use the stairs instead of the elevator.  Take a walk during your lunch break.  If you drive, park your car farther away from  work or school.  If you take public transportation, get off one stop early and walk the rest of the way.  Make all of your phone calls while standing up and walking around.  Get up, stretch, and walk around every 30 minutes throughout the day. What guidelines should I follow while exercising?  Do not exercise so much that you hurt yourself, feel dizzy, or get very short of breath.  Consult your health care provider before starting a new exercise program.  Wear comfortable clothes and shoes with good support.  Drink plenty of water while you exercise to prevent dehydration or heat stroke. Body water is lost during exercise and must be replaced.  Work out until you breathe faster and your heart beats faster. This information is not intended to replace advice given to you by your health care provider. Make sure you discuss any questions you have with your health care provider.

## 2018-04-04 NOTE — Progress Notes (Signed)
[name] DOB: 10/23/1950 Encounter date: 04/04/2018  This isa 67 y.o. male who presents to establish care. Chief Complaint  Patient presents with  . New Patient (Initial Visit)    flu shot,     History of present illness: No family hx of diabetes. Not eating a lot of sweets; quit soda 1-2 months ago. Occasional gatorade and sweet tea. A1C has been around 7.1. Has been on the metformin for some time, but states it has vicious side effects. States another doc told him A1C "stuff is blown out of proportion". Has been doing some research and thinks that with diet/exercise he might be able to get off of medication. Really wants to decrease medications that he is taking if possible. Metformin is causing diarrhea. Taking '500mg'$  metformin once daily. Diarrhea after first meal once taking the metformin. This does affect him esp if trying to be out and about.   Follows with heart doc regularly. Later this month is going for routine stress testing, etc. States that cardiologist told him to get off medication. Doesn't like fact that he is on 3 blood pressure medications. Just started on amlodipine recently. Was previously on spironolactone which caused some gynecomastia and so this was stopped.   Needs protonix for his severe heartburn. Not interested in stopping this.   Sleeping is horrible. Sleeps a couple hours; frequent awakenings. Sometimes going to bathroom; sometimes just wakes up. Not told that he snores. Urination need is not waking him up; it is something else.  Wants to lose weight; knows that he will feel better if he can do this. Has been trying but not having success with losing weight. Drinks a glass or two of sweet tea/day. Still eating potatoes regularly, pastas. Making some adjustments like whole to 2% milk. Had been walking 5 miles/day, but then started having some pain in left hip. Now can only go half mile before hip starts bothering him. Has lifting restrictions due to back  surgery. No access to stationary bike; does have access to swimming pool.   Not on anything for cholesterol. Was put on something years ago; went on and off medications.   Past Medical History:  Diagnosis Date  . CAD, NATIVE VESSEL 04/19/2010   nonobstructive by cath 2007:  oLAD 20-30%, mLAD 50%, pCFX 20-30%, oAVCFX 20-30%, L renal art 50%;  normal LVF  . Cataract    bil cataracts removed  . COPD (chronic obstructive pulmonary disease) ([place])   . Diabetes mellitus without complication ([place])   . GERD 04/09/2007  . Headache(784.0)   . HYPERLIPIDEMIA 04/09/2007   Patient denies.  [name] HYPERTENSION, BENIGN 04/19/2010  . Hypothyroidism   . Hypothyroidism    previous hyperthyroidism, s/p I-131  . LUMBAR DISC DISORDER 05/27/2010  . Nerve damage    Left leg  . OSTEOARTHRITIS, LUMBAR SPINE 04/09/2007  . RENAL CYST 05/27/2010  . Spinal headache    After having back surgery in 2014   Past Surgical History:  Procedure Laterality Date  . BACK SURGERY  2012,2014  . CARDIAC CATHETERIZATION  2007   Dr Loanne Drilling  . CHOLECYSTECTOMY    . CHOLECYSTECTOMY N/A 07/13/2013   Procedure: LAPAROSCOPIC CHOLECYSTECTOMY WITH INTRAOPERATIVE CHOLANGIOGRAM;  Surgeon: Shann Medal, MD;  Location: [facility];  Service: General;  Laterality: N/A;  . COLONOSCOPY    . COLONOSCOPY W/ POLYPECTOMY    . ERCP N/A 07/14/2013   Procedure: ENDOSCOPIC RETROGRADE CHOLANGIOPANCREATOGRAPHY (ERCP);  Surgeon: [name], MD;  Location: [facility];  Service: Endoscopy;  Laterality: N/A;  . EYE SURGERY Bilateral    Cataract removal  . FOOT FRACTURE SURGERY    . FRACTURE SURGERY    . LUMBAR DISC SURGERY  08/06/2012   L3 & L4  . LUMBAR DISC SURGERY  11/11/2015   L1 L2    DR NITKA  . LUMBAR FUSION N/A 04/20/2015   Procedure: T12 to L1 fusion (Extension of Previous Fusion L2-S1 to T12-S1), Right Transforaminal lumbar interbody fusion, Posterior Fusion T12 to L1, with Pedicle screws, allograft, local bone graft, and  Vivigen;   Surgeon: [name], MD;  Location: [facility];  Service: Orthopedics;  Laterality: N/A;  . LUMBAR LAMINECTOMY N/A 11/10/2013   Procedure: Left L1-2 far lateral approach to excise herniated nucleus pulposus;  Surgeon: [name], MD;  Location: [facility];  Service: Orthopedics;  Laterality: N/A;  . LUMBAR LAMINECTOMY/DECOMPRESSION MICRODISCECTOMY Left 08/10/2012   Procedure: Dura Repair Left Side L2-L3;  Surgeon: [name], MD;  Location: [facility];  Service: Orthopedics;  Laterality: Left;  Wilson Frame, Sliding table, dura repair kit, microscope  . NECK SURGERY     X 2  . SPINE SURGERY  2006   C-spine surgery x 2  . UPPER GASTROINTESTINAL ENDOSCOPY     Allergies  Allergen Reactions  . Ceftin Other (See Comments)    Patient stated it caused "sores in mouth" Thrush  . Cefuroxime Axetil Other (See Comments)    Sores in mouth Sores in mouth Patient stated it caused "sores in mouth" Thrush other   Current Meds  Medication Sig  . amLODipine (NORVASC) 5 MG tablet Take 1 tablet (5 mg total) by mouth daily.  . irbesartan (AVAPRO) 150 MG tablet Take 2 tablets (300 mg total) by mouth daily.  [name] levothyroxine (SYNTHROID, LEVOTHROID) 125 MCG tablet TAKE 1 TABLET BY MOUTH EVERY DAY BEFORE BREAKFAST.  . metoprolol succinate (TOPROL-XL) 50 MG 24 hr tablet Take 1 tablet (50 mg total) by mouth daily. Please make overdue yearly appt with Dr. Burt Knack before anymore refills. 1st attempt  . pantoprazole (PROTONIX) 40 MG tablet TAKE 1 TABLET BY MOUTH TWICE A DAY BEFORE MEALS  . tamoxifen (NOLVADEX) 10 MG tablet Take 10 mg by mouth 2 (two) times daily.  . [DISCONTINUED] metFORMIN (GLUCOPHAGE-XR) 500 MG 24 hr tablet Take 2 tablets (1,000 mg total) by mouth daily.   Social History   Tobacco Use  . Smoking status: Former Smoker    Packs/day: 0.50    Years: 44.00    Pack years: 22.00    Types: Cigarettes    Last attempt to quit: 04/07/2007    Years since quitting: 11.0  . Smokeless tobacco: Never Used  .  Tobacco comment: pt has stopped smoking about 9 months now  Substance Use Topics  . Alcohol use: No   Family History  Problem Relation Age of Onset  . Breast cancer Mother   . Uterine cancer Mother   . Heart disease Mother   . Heart attack Mother   . Prostate cancer Father   . Hypertension Brother   . Non-Hodgkin's lymphoma Brother   . Stomach cancer Paternal Grandmother   . Thyroid disease Neg Hx   . Colon cancer Neg Hx   . Esophageal cancer Neg Hx   . Rectal cancer Neg Hx   . Pancreatic cancer Neg Hx      Review of Systems  Constitutional: Negative for chills, fatigue and fever.  Respiratory: Negative for cough, chest tightness, shortness of breath and wheezing.  Cardiovascular: Negative for chest pain, palpitations and leg swelling.    Objective:  BP 122/64 (BP Location: Left Arm, Patient Position: Sitting, Cuff Size: Normal)   Pulse 65   Temp 97.6 F (36.4 C) (Oral)   Ht _0  (1.753 m)   Wt 208 lb 3.2 oz (94.4 kg)   SpO2 96%   BMI 30.75 kg/m   Weight: 208 lb 3.2 oz (94.4 kg)   BP Readings from Last 3 Encounters:  04/04/18 122/64  03/29/18 (!) 140/100  03/27/18 (!) 152/96   Wt Readings from Last 3 Encounters:  04/04/18 208 lb 3.2 oz (94.4 kg)  03/29/18 208 lb 3.2 oz (94.4 kg)  03/27/18 208 lb 6 oz (94.5 kg)    Physical Exam  Constitutional: He is oriented to person, place, and time. He appears well-developed and well-nourished. No distress.  HENT:  Mouth/Throat: Uvula is midline and oropharynx is clear and moist.  Small oropharynx  Cardiovascular: Normal rate and regular rhythm. Exam reveals no friction rub.  Murmur heard.  Systolic murmur is present with a grade of 2/6. No lower extremity edema  Pulmonary/Chest: Effort normal and breath sounds normal. No respiratory distress. He has no wheezes. He has no rales.  Neurological: He is alert and oriented to person, place, and time.  Psychiatric: He has a normal mood and affect. His behavior is normal.  His speech is rapid and/or pressured. Cognition and memory are normal.    Assessment/Plan: We had an extensive discussion today regarding medications/need for medications and benefits of these as well as benefits of lifestyle modifications.   1. Controlled type 2 diabetes mellitus with complication, without long-term current use of insulin ([place]) We discussed benefits of staying on the metformin, esp with wanting to lose weight and concerns with insulin resistance. He does not have a diabetic friendly diet at this point although has made some changes. I think he would greatly benefit from on one on to review foods that would be better choices for him. We discussed in the meanwhile - cutting potato in half and cutting out sweet tea as these are easily modifiable dietary factors for him. We also discussed that with dietary changes, exercise there is a chance of coming off medication, but that diabetes can be progressive even with best lifestyle control, so it might not be possible for him to come off medications. Trial if covered with PA of glucophage as I find patients sometimes have less diarrhea with brand name. In meanwhile; trial of metformin at bedtime. - Amb Referral to Nutrition and Diabetic E - Hemoglobin A1c; Future  2. Insomnia, unspecified type Sleep is one of his biggest concerns. Would like further evaluation for this.  - Ambulatory referral to Sleep Studies  3. Hyperlipidemia associated with type 2 diabetes mellitus ([place]) - Lipid panel; Future  4. Need for influenza vaccination - Flu Vaccine QUAD 6+ mos PF IM (Fluarix Quad PF)  5. CAD/HTN: bp looks well controlled today. Reviewed pathophys reasons for current bp medications. Also will plan to recheck lipids as statin would be recommended with diabetes and CAD.  Return bloodwork in November with physical to follow.  Micheline Rough, MD

## 2018-04-08 ENCOUNTER — Encounter: Payer: PPO | Admitting: Family Medicine

## 2018-04-09 ENCOUNTER — Telehealth (HOSPITAL_COMMUNITY): Payer: Self-pay

## 2018-04-09 ENCOUNTER — Other Ambulatory Visit: Payer: Self-pay | Admitting: Cardiovascular Disease

## 2018-04-09 NOTE — Telephone Encounter (Signed)
Reached the pt and gave him detailed instructions for his stress test on Thursday 04/11/2018. Pt stated that he understood and would be here for his test. S.Lacy Sofia EMTP

## 2018-04-10 ENCOUNTER — Telehealth: Payer: Self-pay | Admitting: *Deleted

## 2018-04-10 NOTE — Telephone Encounter (Signed)
Prior auth for the brand name of Glucophage XR 500mg  tablets sent to [email or web address] [id number].

## 2018-04-11 ENCOUNTER — Ambulatory Visit (HOSPITAL_COMMUNITY): Payer: PPO | Attending: Internal Medicine

## 2018-04-11 DIAGNOSIS — R0602 Shortness of breath: Secondary | ICD-10-CM | POA: Diagnosis not present

## 2018-04-11 DIAGNOSIS — I2510 Atherosclerotic heart disease of native coronary artery without angina pectoris: Secondary | ICD-10-CM | POA: Insufficient documentation

## 2018-04-11 LAB — MYOCARDIAL PERFUSION IMAGING
CHL CUP NUCLEAR SDS: 1
CHL CUP NUCLEAR SRS: 1
CHL CUP NUCLEAR SSS: 2
CSEPPHR: 86 {beats}/min
LV dias vol: 89 mL (ref 62–150)
LV sys vol: 43 mL
Rest HR: 62 {beats}/min
TID: 1.19

## 2018-04-11 MED ORDER — TECHNETIUM TC 99M TETROFOSMIN IV KIT
32.8000 | PACK | Freq: Once | INTRAVENOUS | Status: AC | PRN
  Administered 2018-04-11: 32.8 via INTRAVENOUS
  Filled 2018-04-11: qty 33

## 2018-04-11 MED ORDER — TECHNETIUM TC 99M TETROFOSMIN IV KIT
10.3000 | PACK | Freq: Once | INTRAVENOUS | Status: AC | PRN
  Administered 2018-04-11: 10.3 via INTRAVENOUS
  Filled 2018-04-11: qty 11

## 2018-04-11 MED ORDER — REGADENOSON 0.4 MG/5ML IV SOLN
0.4000 mg | Freq: Once | INTRAVENOUS | Status: AC
  Administered 2018-04-11: 0.4 mg via INTRAVENOUS

## 2018-04-18 ENCOUNTER — Other Ambulatory Visit: Payer: Self-pay | Admitting: Cardiovascular Disease

## 2018-04-23 ENCOUNTER — Other Ambulatory Visit (INDEPENDENT_AMBULATORY_CARE_PROVIDER_SITE_OTHER): Payer: PPO

## 2018-04-23 DIAGNOSIS — E1169 Type 2 diabetes mellitus with other specified complication: Secondary | ICD-10-CM

## 2018-04-23 DIAGNOSIS — E118 Type 2 diabetes mellitus with unspecified complications: Secondary | ICD-10-CM

## 2018-04-23 DIAGNOSIS — E785 Hyperlipidemia, unspecified: Secondary | ICD-10-CM | POA: Diagnosis not present

## 2018-04-23 LAB — LIPID PANEL
CHOLESTEROL: 124 mg/dL (ref 0–200)
HDL: 30.9 mg/dL — ABNORMAL LOW (ref >39.00)
LDL CALC: 61 mg/dL (ref 0–99)
NonHDL: 93.36
Total CHOL/HDL Ratio: 4
Triglycerides: 163 mg/dL — ABNORMAL HIGH (ref 0.0–149.0)
VLDL: 32.6 mg/dL (ref 0.0–40.0)

## 2018-04-23 LAB — HEMOGLOBIN A1C: Hgb A1c MFr Bld: 7.8 % — ABNORMAL HIGH (ref 4.6–6.5)

## 2018-04-26 ENCOUNTER — Encounter: Payer: Self-pay | Admitting: Dietician

## 2018-04-26 ENCOUNTER — Encounter: Payer: PPO | Attending: Endocrinology | Admitting: Dietician

## 2018-04-26 DIAGNOSIS — Z6830 Body mass index (BMI) 30.0-30.9, adult: Secondary | ICD-10-CM | POA: Insufficient documentation

## 2018-04-26 DIAGNOSIS — Z713 Dietary counseling and surveillance: Secondary | ICD-10-CM | POA: Insufficient documentation

## 2018-04-26 DIAGNOSIS — E118 Type 2 diabetes mellitus with unspecified complications: Secondary | ICD-10-CM | POA: Insufficient documentation

## 2018-04-26 DIAGNOSIS — E119 Type 2 diabetes mellitus without complications: Secondary | ICD-10-CM

## 2018-04-26 NOTE — Patient Instructions (Addendum)
Water.  Avoid beverages with sugar.  Investigate Silver Sneakers Stay active.  Aim for 30 minutes most days.  Start slow and increase as tolerated. (stationary bike, armchair exercises, walking, pool)  Recommend Vitamin D 2000 units daily Consider Vitamin B-12 (sublingual or dissolvable)  Mindfulness:  Choices  Eat slowly away from distraction  Stop when you are satisfied  Aim for 3 Carb Choices per meal (45 grams) +/- 1 either way  Aim for 0-1 Carbs per snack if hungry  Include protein in moderation with your meals and snacks Consider reading food labels for Total Carbohydrate and Fat Grams of foods Consider checking BG at alternate times per day as directed by MD

## 2018-04-26 NOTE — Progress Notes (Signed)
Diabetes Self-Management Education  Visit Type: First/Initial  Appt. Start Time: 0900 Appt. End Time: 1030  04/26/2018  [name], identified by name and date of birth, is a 67 y.o. male with a diagnosis of Diabetes: Type 2.  History includes CAD with recent normal workup per patient, 5 back surgeries, GERD, HTN, COPD, HLD, hypothyroidism, poor sleep (sleeps 2 hours and frequent waking).  Patient is to be scheduled for a sleep study.  He wants to lose weight.  He wants to reduce his medications. Medications include:  Metformin ER but reports daily diarrhea. Labs noted to include :  Vitamin D 24, Vitamin B-12 385 (10/03/17) and Cholesterol 124, HDL 31, LDL 61, Triglycerides 163 (04/23/18)  Weight hx: 145 lbs in college 208 lbs today 210 lbs 1 month ago 2-3 months ago patient stopped all soda.  He continues to drink sweet tea when out and gatorade.  He has also changed from whole milk to 2% milk.  He states that he eats healthy food and occasionally fries.  He states that he is trying to eat the right foods.  He is not exercising but has had 5 back surgeries and is "completely" fused.  When he walks his hip hurts.  Patient lives alone.  He is retired as a Buyer, retail.   ASSESSMENT  Height 5\' 9"  (1.753 m), weight 208 lb (94.3 kg). Body mass index is 30.72 kg/m.  Diabetes Self-Management Education - 04/26/18 0924      Visit Information   Visit Type  First/Initial      Initial Visit   Diabetes Type  Type 2    Are you currently following a meal plan?  Yes    What type of meal plan do you follow?  decreased sugar    Are you taking your medications as prescribed?  Yes    Date Diagnosed  2018      Health Coping   How would you rate your overall health?  Good      Psychosocial Assessment   Patient Belief/Attitude about Diabetes  Motivated to manage diabetes    Self-care barriers  None    Self-management support  Doctor's office    Other persons  present  Patient    Patient Concerns  Nutrition/Meal planning;Glycemic Control;Weight Control    Special Needs  None    Preferred Learning Style  No preference indicated    Learning Readiness  Ready    How often do you need to have someone help you when you read instructions, pamphlets, or other written materials from your doctor or pharmacy?  1 - Never    What is the last grade level you completed in school?  college degree      Pre-Education Assessment   Patient understands the diabetes disease and treatment process.  Needs Instruction    Patient understands incorporating nutritional management into lifestyle.  Needs Instruction    Patient undertands incorporating physical activity into lifestyle.  Needs Instruction    Patient understands using medications safely.  Needs Instruction    Patient understands monitoring blood glucose, interpreting and using results  Needs Instruction    Patient understands prevention, detection, and treatment of acute complications.  Needs Instruction    Patient understands prevention, detection, and treatment of chronic complications.  Needs Instruction    Patient understands how to develop strategies to address psychosocial issues.  Needs Instruction    Patient understands how to develop strategies to promote health/change behavior.  Needs Instruction  Complications   Last HgB A1C per patient/outside source  7.8 %   04/23/18   How often do you check your blood sugar?  0 times/day (not testing)    Have you had a dilated eye exam in the past 12 months?  Yes    Have you had a dental exam in the past 12 months?  Yes    Are you checking your feet?  No      Dietary Intake   Breakfast  skips   "never a breakfast person"   Snack (morning)  none    Lunch  greek salad with vinegretteOR burger   11:00-11:30   Snack (afternoon)  Occasional 2 PB crackers    Dinner  chili or fried pork chips, baked potato, vegetables or cube steak or country style steak or  spaghetti   5-6   Snack (evening)  pork skins or chips, rare sweets   9-10   Beverage(s)  very little water, sweet tea when out, gatorade (1 bottle a day), 4 cups 2% milk daily      Exercise   Exercise Type  ADL's      Patient Education   Previous Diabetes Education  Yes (please comment)   at MD office   Disease state   Definition of diabetes, type 1 and 2, and the diagnosis of diabetes;Factors that contribute to the development of diabetes    Nutrition management   Role of diet in the treatment of diabetes and the relationship between the three main macronutrients and blood glucose level;Food label reading, portion sizes and measuring food.;Meal options for control of blood glucose level and chronic complications.;Information on hints to eating out and maintain blood glucose control.;Meal timing in regards to the patients' current diabetes medication.;Reviewed blood glucose goals for pre and post meals and how to evaluate the patients' food intake on their blood glucose level.    Physical activity and exercise   Role of exercise on diabetes management, blood pressure control and cardiac health.;Helped patient identify appropriate exercises in relation to his/her diabetes, diabetes complications and other health issue.    Medications  Reviewed patients medication for diabetes, action, purpose, timing of dose and side effects.    Monitoring  Taught/evaluated SMBG meter.;Purpose and frequency of SMBG.    Acute complications  Taught treatment of hypoglycemia - the 15 rule.;Discussed and identified patients' treatment of hyperglycemia.    Chronic complications  Assessed and discussed foot care and prevention of foot problems    Psychosocial adjustment  Role of stress on diabetes;Worked with patient to identify barriers to care and solutions    Personal strategies to promote health  Lifestyle issues that need to be addressed for better diabetes care      Individualized Goals (developed by patient)    Nutrition  General guidelines for healthy choices and portions discussed    Physical Activity  Exercise 5-7 days per week;30 minutes per day    Medications  take my medication as prescribed    Monitoring   test my blood glucose as discussed    Reducing Risk  examine blood glucose patterns;increase portions of healthy fats    Health Coping  discuss diabetes with (comment)   MD, RD, CDE     Post-Education Assessment   Patient understands the diabetes disease and treatment process.  Needs Instruction    Patient understands incorporating nutritional management into lifestyle.  Needs Instruction    Patient undertands incorporating physical activity into lifestyle.  Needs Instruction  Patient understands using medications safely.  Needs Instruction    Patient understands monitoring blood glucose, interpreting and using results  Needs Instruction    Patient understands prevention, detection, and treatment of acute complications.  Needs Instruction    Patient understands prevention, detection, and treatment of chronic complications.  Needs Instruction    Patient understands how to develop strategies to address psychosocial issues.  Needs Instruction    Patient understands how to develop strategies to promote health/change behavior.  Needs Review      Outcomes   Expected Outcomes  Demonstrated interest in learning. Expect positive outcomes    Future DMSE  4-6 wks    Program Status  Completed       Individualized Plan for Diabetes Self-Management Training:   Learning Objective:  Patient will have a greater understanding of diabetes self-management. Patient education plan is to attend individual and/or group sessions per assessed needs and concerns.   Plan:   Patient Instructions  Water.  Avoid beverages with sugar.  Investigate Silver Sneakers Stay active.  Aim for 30 minutes most days.  Start slow and increase as tolerated. (stationary bike, armchair exercises, walking,  pool)  Recommend Vitamin D 2000 units daily Consider Vitamin B-12 (sublingual or dissolvable)  Mindfulness:  Choices  Eat slowly away from distraction  Stop when you are satisfied  Aim for 3 Carb Choices per meal (45 grams) +/- 1 either way  Aim for 0-1 Carbs per snack if hungry  Include protein in moderation with your meals and snacks Consider reading food labels for Total Carbohydrate and Fat Grams of foods Consider checking BG at alternate times per day as directed by MD      Expected Outcomes:  Demonstrated interest in learning. Expect positive outcomes  Education material provided: ADA Diabetes: Your Take Control Guide, Food label handouts, Meal plan card, My Plate and Snack sheet, breakfast ideas, eating out  If problems or questions, patient to contact team via:  Phone  Future DSME appointment: 4-6 wks

## 2018-04-29 ENCOUNTER — Telehealth: Payer: Self-pay

## 2018-04-29 ENCOUNTER — Telehealth: Payer: Self-pay | Admitting: Family Medicine

## 2018-04-29 ENCOUNTER — Other Ambulatory Visit: Payer: Self-pay | Admitting: Family Medicine

## 2018-04-29 ENCOUNTER — Ambulatory Visit (INDEPENDENT_AMBULATORY_CARE_PROVIDER_SITE_OTHER): Payer: PPO | Admitting: Family Medicine

## 2018-04-29 ENCOUNTER — Encounter: Payer: Self-pay | Admitting: Family Medicine

## 2018-04-29 VITALS — BP 130/62 | HR 71 | Temp 98.9°F | Ht 69.0 in | Wt 206.8 lb

## 2018-04-29 DIAGNOSIS — I10 Essential (primary) hypertension: Secondary | ICD-10-CM

## 2018-04-29 DIAGNOSIS — Z23 Encounter for immunization: Secondary | ICD-10-CM | POA: Diagnosis not present

## 2018-04-29 DIAGNOSIS — I25119 Atherosclerotic heart disease of native coronary artery with unspecified angina pectoris: Secondary | ICD-10-CM | POA: Diagnosis not present

## 2018-04-29 DIAGNOSIS — E1169 Type 2 diabetes mellitus with other specified complication: Secondary | ICD-10-CM

## 2018-04-29 MED ORDER — EMPAGLIFLOZIN 10 MG PO TABS: 10.0000 mg | ORAL_TABLET | Freq: Every day | ORAL | 2 refills | Status: DC

## 2018-04-29 NOTE — Patient Instructions (Signed)
Vitamin D 1000 units daily

## 2018-04-29 NOTE — Telephone Encounter (Signed)
Sent for him. May note increased urination in first week; but should level off.

## 2018-04-29 NOTE — Telephone Encounter (Signed)
Spoke with patient he stated that he would like to try the jardiance 10 mg.

## 2018-04-29 NOTE — Telephone Encounter (Signed)
Noted patient is aware 

## 2018-04-29 NOTE — Telephone Encounter (Signed)
Patient has been added to the 11:00 slot. Nothing further needed.

## 2018-04-29 NOTE — Telephone Encounter (Signed)
Called patient to move her appointment to 10:30 today or if she can reschedule for another day. The provider is needing to leave early. The patients phone number is not set up to leave a voicemail.

## 2018-04-29 NOTE — Telephone Encounter (Signed)
So check with him; he is tolerating the metformin better now than he had in past. But we also discussed trying jardiance 10mg  daily instead. I am happy to go either way.

## 2018-04-29 NOTE — Telephone Encounter (Signed)
Patient was in the office today and stated that he has not heard from [facility] in regards to this RX. Spoke with patients pharmacy to make sure they did receive the RX. They stated that they had received a Rx but that it is not covered on patients insurance.  Please advise. Is there an alternative that you would like to try?

## 2018-04-29 NOTE — Progress Notes (Signed)
Jeff Lopez DOB: 1951/03/18 Encounter date: 04/29/2018  This is a 67 y.o. male who presents for chronic condition visit (scheduled for physical but had completed with previous provider in September)  History of present illness/Additional concerns: Met with dietician on 11/1. He feels like he will "turn into a squirrel" if he follows all recommendations. Did feel like tips were helpful. Had some good suggestions. Feels like he is making some changes that are keeping him on healthier track.   Had follow up with cardiology: amlodipine was added to help with bp control. Stress testing on 04/11/18 was normal.   Still has some diarrhea with glucophage. Ginger dressing seems to trigger this the most. States that he didn't get the glucophage so still taking the metformin. Does feel like healthier eating has helped with this although still has some episodes.   Exercise is still limited. Hip starts hurting on left side once he gets up to half a mile. Keeps him from walking further. 6 months ago could walk further. Has had MRI of left hip; states it was ok; not sure what is going on. Has been trying to do stretches we discussed at last visit and does feel like this is helping from muscular standpoint.    Past Medical History:  Diagnosis Date  . CAD, NATIVE VESSEL 04/19/2010   nonobstructive by cath 2007:  oLAD 20-30%, mLAD 50%, pCFX 20-30%, oAVCFX 20-30%, L renal art 50%;  normal LVF  . Cataract    bil cataracts removed  . COPD (chronic obstructive pulmonary disease) ([place])   . Diabetes mellitus without complication ([place])   . GERD 04/09/2007  . Headache(784.0)   . HYPERLIPIDEMIA 04/09/2007   Patient denies.  [name] HYPERTENSION, BENIGN 04/19/2010  . Hypothyroidism   . Hypothyroidism    previous hyperthyroidism, s/p I-131  . LUMBAR DISC DISORDER 05/27/2010  . Nerve damage    Left leg  . OSTEOARTHRITIS, LUMBAR SPINE 04/09/2007  . RENAL CYST 05/27/2010  . Spinal headache    After having back  surgery in 2014   Past Surgical History:  Procedure Laterality Date  . BACK SURGERY  2012,2014  . CARDIAC CATHETERIZATION  2007   Dr Loanne Drilling  . CHOLECYSTECTOMY    . CHOLECYSTECTOMY N/A 07/13/2013   Procedure: LAPAROSCOPIC CHOLECYSTECTOMY WITH INTRAOPERATIVE CHOLANGIOGRAM;  Surgeon: Shann Medal, MD;  Location: [facility];  Service: General;  Laterality: N/A;  . COLONOSCOPY    . COLONOSCOPY W/ POLYPECTOMY    . ERCP N/A 07/14/2013   Procedure: ENDOSCOPIC RETROGRADE CHOLANGIOPANCREATOGRAPHY (ERCP);  Surgeon: [name], MD;  Location: [facility];  Service: Endoscopy;  Laterality: N/A;  . EYE SURGERY Bilateral    Cataract removal  . FOOT FRACTURE SURGERY    . FRACTURE SURGERY    . LUMBAR DISC SURGERY  08/06/2012   L3 & L4  . LUMBAR DISC SURGERY  11/11/2015   L1 L2    DR NITKA  . LUMBAR FUSION N/A 04/20/2015   Procedure: T12 to L1 fusion (Extension of Previous Fusion L2-S1 to T12-S1), Right Transforaminal lumbar interbody fusion, Posterior Fusion T12 to L1, with Pedicle screws, allograft, local bone graft, and  Vivigen;  Surgeon: [name], MD;  Location: [place];  Service: Orthopedics;  Laterality: N/A;  . LUMBAR LAMINECTOMY N/A 11/10/2013   Procedure: Left L1-2 far lateral approach to excise herniated nucleus pulposus;  Surgeon: [name], MD;  Location: [place];  Service: Orthopedics;  Laterality: N/A;  . LUMBAR LAMINECTOMY/DECOMPRESSION MICRODISCECTOMY Left 08/10/2012  Procedure: Dura Repair Left Side L2-L3;  Surgeon: [name], MD;  Location: [place];  Service: Orthopedics;  Laterality: Left;  Wilson Frame, Sliding table, dura repair kit, microscope  . NECK SURGERY     X 2  . SPINE SURGERY  2006   C-spine surgery x 2  . UPPER GASTROINTESTINAL ENDOSCOPY     Allergies  Allergen Reactions  . Ceftin Other (See Comments)    Patient stated it caused "sores in mouth" Thrush  . Cefuroxime Axetil Other (See Comments)    Sores in mouth Sores in mouth Patient stated it caused  "sores in mouth" Thrush other   Current Meds  Medication Sig  . amLODipine (NORVASC) 5 MG tablet Take 1 tablet (5 mg total) by mouth daily.  [name] GLUCOPHAGE XR 500 MG 24 hr tablet Take 1 tablet (500 mg total) by mouth daily with breakfast.  . irbesartan (AVAPRO) 300 MG tablet TAKE 1 TABLET(300 MG) BY MOUTH DAILY  . levothyroxine (SYNTHROID, LEVOTHROID) 125 MCG tablet TAKE 1 TABLET BY MOUTH EVERY DAY BEFORE BREAKFAST.  . metoprolol succinate (TOPROL-XL) 50 MG 24 hr tablet TAKE 1 TABLET BY MOUTH DAILY  . pantoprazole (PROTONIX) 40 MG tablet TAKE 1 TABLET BY MOUTH TWICE A DAY BEFORE MEALS  . [DISCONTINUED] tamoxifen (NOLVADEX) 10 MG tablet Take 10 mg by mouth 2 (two) times daily.   Social History   Tobacco Use  . Smoking status: Former Smoker    Packs/day: 0.50    Years: 44.00    Pack years: 22.00    Types: Cigarettes    Last attempt to quit: 04/07/2007    Years since quitting: 11.0  . Smokeless tobacco: Never Used  . Tobacco comment: pt has stopped smoking about 9 months now  Substance Use Topics  . Alcohol use: No   Family History  Problem Relation Age of Onset  . Breast cancer Mother   . Uterine cancer Mother   . Heart disease Mother   . Heart attack Mother 64  . Prostate cancer Father   . Hypertension Brother   . Non-Hodgkin's lymphoma Brother   . Stomach cancer Paternal Grandmother   . Thyroid disease Neg Hx   . Colon cancer Neg Hx   . Esophageal cancer Neg Hx   . Rectal cancer Neg Hx   . Pancreatic cancer Neg Hx      Review of Systems  Constitutional: Negative for chills, fatigue and fever.  Respiratory: Negative for cough, chest tightness, shortness of breath and wheezing.   Cardiovascular: Negative for chest pain, palpitations and leg swelling.  Gastrointestinal: Positive for diarrhea. Negative for abdominal pain, blood in stool, constipation and vomiting.    CBC:  Lab Results  Component Value Date   WBC 5.3 12/26/2017   HGB 14.9 12/26/2017   HGB 13.8  04/30/2014   HCT 43.5 12/26/2017   HCT 42.0 04/30/2014   MCH 33.5 04/23/2015   MCHC 34.4 12/26/2017   RDW 12.0 12/26/2017   RDW 13.1 04/30/2014   PLT 180.0 12/26/2017   PLT 215 04/30/2014   CMP: Lab Results  Component Value Date   NA 139 12/26/2017   NA 140 07/05/2017   K 4.0 12/26/2017   CL 104 12/26/2017   CO2 23 12/26/2017   ANIONGAP 12 04/21/2015   GLUCOSE 155 (H) 12/26/2017   BUN 23 12/26/2017   BUN 19 07/05/2017   CREATININE 1.37 12/26/2017   CREATININE 1.18 09/10/2015   GFRAA 67 07/05/2017   CALCIUM 9.2 12/26/2017  PROT 6.9 06/05/2016   BILITOT 1.3 (H) 06/05/2016   ALKPHOS 84 06/05/2016   ALT 33 06/05/2016   AST 20 06/05/2016   LIPID: Lab Results  Component Value Date   CHOL 124 04/23/2018   TRIG 163.0 (H) 04/23/2018   HDL 30.90 (L) 04/23/2018   LDLCALC 61 04/23/2018    Objective:  BP 130/62 (BP Location: Left Arm, Patient Position: Sitting, Cuff Size: Normal)   Pulse 71   Temp 98.9 F (37.2 C) (Oral)   Ht '5\' 9"'  (1.753 m)   Wt 206 lb 12.8 oz (93.8 kg)   SpO2 93%   BMI 30.54 kg/m   Weight: 206 lb 12.8 oz (93.8 kg)   BP Readings from Last 3 Encounters:  04/29/18 130/62  04/04/18 122/64  03/29/18 (!) 140/100   Wt Readings from Last 3 Encounters:  04/29/18 206 lb 12.8 oz (93.8 kg)  04/26/18 208 lb (94.3 kg)  04/11/18 208 lb (94.3 kg)    Physical Exam  Constitutional: He appears well-developed and well-nourished. No distress.  HENT:  Head: Normocephalic and atraumatic.  Cardiovascular: Normal rate, regular rhythm, normal heart sounds and intact distal pulses.  No murmur heard. Pulmonary/Chest: Effort normal and breath sounds normal.  Abdominal: Soft. Bowel sounds are normal. He exhibits no distension. There is no tenderness. There is no guarding.  Skin: Skin is warm and dry.  Sensory exam of the foot is normal, tested with the monofilament. Good pulses, no lesions or ulcers, good peripheral pulses.  Psychiatric: He has a normal mood and  affect. Judgment normal.    Assessment/Plan: Health Maintenance Due  Topic Date Due  . PNA vac Low Risk Adult (2 of 2 - PPSV23) 04/02/2017  . OPHTHALMOLOGY EXAM  02/27/2018   Health Maintenance reviewed - Pneumovax given.  1. Need for pneumococcal vaccination - Pneumococcal polysaccharide vaccine 23-valent greater than or equal to 2yo subcutaneous/IM  2. HYPERTENSION, BENIGN Stable. Continue current medications. - Comprehensive metabolic panel; Future - CBC with Differential/Platelet; Future  3. Atherosclerosis of native coronary artery of native heart with angina pectoris [facility]) Follows with cardiology. Stress testing last month wnl.  4. Type 2 diabetes mellitus with other specified complication, without long-term current use of insulin ([place]) He is going to continue to work on dietary improvement; sugar reduction; carb reduction. We discussed exercise most. He is looking into water exercises which he will be able to tolerate with chronic back issues.  [facility] are calling [facility] to check on status of glucophage. If unable to get covered by insurance could switch to jardiance for bs control and cardio benefit.  We also discussed statin use. Discussed that it is recommended with dx of diabetes; however, his cholesterol is well controlled. Plan is to recheck in 6 months and if HDL is not improved will add statin. He is really wanting to come off of and not add medications at this point.   - Hemoglobin A1c; Future - Comprehensive metabolic panel; Future - Microalbumin / creatinine urine ratio; Future  Return in about 3 months (around 07/30/2018), or bloodwork prior to appointment.  Micheline Rough, MD

## 2018-05-14 ENCOUNTER — Other Ambulatory Visit: Payer: Self-pay | Admitting: Endocrinology

## 2018-05-14 ENCOUNTER — Other Ambulatory Visit: Payer: Self-pay | Admitting: Cardiovascular Disease

## 2018-05-16 ENCOUNTER — Encounter: Payer: Self-pay | Admitting: Pulmonary Disease

## 2018-05-16 ENCOUNTER — Ambulatory Visit: Payer: PPO | Admitting: Pulmonary Disease

## 2018-05-16 VITALS — BP 142/78 | HR 72 | Ht 69.0 in | Wt 205.8 lb

## 2018-05-16 DIAGNOSIS — G478 Other sleep disorders: Secondary | ICD-10-CM | POA: Diagnosis not present

## 2018-05-16 DIAGNOSIS — C182 Malignant neoplasm of ascending colon: Secondary | ICD-10-CM | POA: Diagnosis not present

## 2018-05-16 DIAGNOSIS — G2581 Restless legs syndrome: Secondary | ICD-10-CM

## 2018-05-16 LAB — FERRITIN: FERRITIN: 870.2 ng/mL — AB (ref 22.0–322.0)

## 2018-05-16 MED ORDER — ESZOPICLONE 2 MG PO TABS: 2.0000 mg | ORAL_TABLET | Freq: Every evening | ORAL | 1 refills | Status: DC | PRN

## 2018-05-16 NOTE — Progress Notes (Signed)
Jeff Lopez    [id number]    [birth year]  Primary Care Physician:Koberlein, Steele Berg, MD  Referring Physician: Caren Macadam, [place], [place] [id number]  Chief complaint:   Patient with nonrestorative sleep, insomnia, possible restless legs  HPI:  Patient with a history of nonrestorative sleep He would usually go to bed easily enough, wakes up every 2 hours Not able to sleep continuously for many hours He wakes up tired in the morning, he does take daytime naps that may last up to 2 hours on occasions Has gained some weight recently  Does not recollect been told about snoring in the past, no witnessed apneas  Has chronic back pain, has had at least 5 back surgeries Has a concern for possible restless legs He does have creepy qualities, moving his limbs around sometimes does help, onset of symptoms is usually when he is trying to relax and go to sleep He does have a lot of achiness in his thighs  He has tried multiple over-the-counter medications for his sleep maintenance insomnia without improvement  He does have morning headaches, no night sweats, no significant dryness of his mouth in the mornings  He usually goes to bed between 10 and 11 PM, has no set wake up time in the morning Usually wake up during the night every 2 hours  He does have a history of hypertension, diabetes, reformed smoker, no significant alcohol use  Corporate worker in the past, retired  Outpatient Encounter Medications as of 05/16/2018  Medication Sig  . amLODipine (NORVASC) 5 MG tablet Take 1 tablet (5 mg total) by mouth daily.  [name] aspirin-acetaminophen-caffeine (EXCEDRIN MIGRAINE) 250-250-65 MG tablet Take by mouth every 6 (six) hours as needed for headache.  . irbesartan (AVAPRO) 300 MG tablet TAKE 1 TABLET(300 MG) BY MOUTH DAILY  . levothyroxine (SYNTHROID, LEVOTHROID) 125 MCG tablet TAKE 1 TABLET BY MOUTH EVERY DAY BEFORE BREAKFAST.  . metFORMIN  (GLUCOPHAGE-XR) 500 MG 24 hr tablet   . metoprolol succinate (TOPROL-XL) 50 MG 24 hr tablet TAKE 1 TABLET BY MOUTH DAILY  . pantoprazole (PROTONIX) 40 MG tablet TAKE 1 TABLET BY MOUTH TWICE A DAY BEFORE MEALS  . eszopiclone (LUNESTA) 2 MG TABS tablet Take 1 tablet (2 mg total) by mouth at bedtime as needed for sleep. Take immediately before bedtime  . [DISCONTINUED] empagliflozin (JARDIANCE) 10 MG TABS tablet Take 10 mg by mouth daily.   No facility-administered encounter medications on file as of 05/16/2018.     Allergies as of 05/16/2018 - Review Complete 05/16/2018  Allergen Reaction Noted  . Ceftin Other (See Comments) 07/31/2011  . Cefuroxime axetil Other (See Comments) 07/31/2011    Past Medical History:  Diagnosis Date  . CAD, NATIVE VESSEL 04/19/2010   nonobstructive by cath 2007:  oLAD 20-30%, mLAD 50%, pCFX 20-30%, oAVCFX 20-30%, L renal art 50%;  normal LVF  . Cataract    bil cataracts removed  . COPD (chronic obstructive pulmonary disease) ([place])   . Diabetes mellitus without complication ([place])   . GERD 04/09/2007  . Headache(784.0)   . HYPERLIPIDEMIA 04/09/2007   Patient denies.  [name] HYPERTENSION, BENIGN 04/19/2010  . Hypothyroidism   . Hypothyroidism    previous hyperthyroidism, s/p I-131  . LUMBAR DISC DISORDER 05/27/2010  . Nerve damage    Left leg  . OSTEOARTHRITIS, LUMBAR SPINE 04/09/2007  . RENAL CYST 05/27/2010  . Spinal headache    After having back surgery  in 2014    Past Surgical History:  Procedure Laterality Date  . BACK SURGERY  2012,2014  . CARDIAC CATHETERIZATION  2007   Dr Loanne Drilling  . CHOLECYSTECTOMY    . CHOLECYSTECTOMY N/A 07/13/2013   Procedure: LAPAROSCOPIC CHOLECYSTECTOMY WITH INTRAOPERATIVE CHOLANGIOGRAM;  Surgeon: Shann Medal, MD;  Location[facility];  Service: General;  Laterality: N/A;  . COLONOSCOPY    . COLONOSCOPY W/ POLYPECTOMY    . ERCP N/A 07/14/2013   Procedure: ENDOSCOPIC RETROGRADE CHOLANGIOPANCREATOGRAPHY (ERCP);  Surgeon:  [name], MD;  Location: [facility];  Service: Endoscopy;  Laterality: N/A;  . EYE SURGERY Bilateral    Cataract removal  . FOOT FRACTURE SURGERY    . FRACTURE SURGERY    . LUMBAR DISC SURGERY  08/06/2012   L3 & L4  . LUMBAR DISC SURGERY  11/11/2015   L1 L2    DR NITKA  . LUMBAR FUSION N/A 04/20/2015   Procedure: T12 to L1 fusion (Extension of Previous Fusion L2-S1 to T12-S1), Right Transforaminal lumbar interbody fusion, Posterior Fusion T12 to L1, with Pedicle screws, allograft, local bone graft, and  Vivigen;  Surgeon: [name], MD;  Location: [facility];  Service: Orthopedics;  Laterality: N/A;  . LUMBAR LAMINECTOMY N/A 11/10/2013   Procedure: Left L1-2 far lateral approach to excise herniated nucleus pulposus;  Surgeon: [name], MD;  Location: [facility];  Service: Orthopedics;  Laterality: N/A;  . LUMBAR LAMINECTOMY/DECOMPRESSION MICRODISCECTOMY Left 08/10/2012   Procedure: Dura Repair Left Side L2-L3;  Surgeon: [name], MD;  Location: [facility];  Service: Orthopedics;  Laterality: Left;  Wilson Frame, Sliding table, dura repair kit, microscope  . NECK SURGERY     X 2  . SPINE SURGERY  2006   C-spine surgery x 2  . UPPER GASTROINTESTINAL ENDOSCOPY      Family History  Problem Relation Age of Onset  . Breast cancer Mother   . Uterine cancer Mother   . Heart disease Mother   . Heart attack Mother 64  . Prostate cancer Father   . Hypertension Brother   . Non-Hodgkin's lymphoma Brother   . Stomach cancer Paternal Grandmother   . Thyroid disease Neg Hx   . Colon cancer Neg Hx   . Esophageal cancer Neg Hx   . Rectal cancer Neg Hx   . Pancreatic cancer Neg Hx     Social History   Socioeconomic History  . Marital status: Single    Spouse name: Not on file  . Number of children: 1  . Years of education: Not on file  . Highest education level: Not on file  Occupational History  . Occupation: DISTRIBUTION [employer]    Employer: [employer]  Social Needs  .  Financial resource strain: Not on file  . Food insecurity:    Worry: Not on file    Inability: Not on file  . Transportation needs:    Medical: Not on file    Non-medical: Not on file  Tobacco Use  . Smoking status: Former Smoker    Packs/day: 0.50    Years: 44.00    Pack years: 22.00    Types: Cigarettes    Last attempt to quit: 04/07/2007    Years since quitting: 11.1  . Smokeless tobacco: Never Used  . Tobacco comment: pt has stopped smoking about 9 months now  Substance and Sexual Activity  . Alcohol use: No  . Drug use: No  . Sexual activity: Not Currently  Lifestyle  .  Physical activity:    Days per week: Not on file    Minutes per session: Not on file  . Stress: Not on file  Relationships  . Social connections:    Talks on phone: Not on file    Gets together: Not on file    Attends religious service: Not on file    Active member of club or organization: Not on file    Attends meetings of clubs or organizations: Not on file    Relationship status: Not on file  . Intimate partner violence:    Fear of current or ex partner: Not on file    Emotionally abused: Not on file    Physically abused: Not on file    Forced sexual activity: Not on file  Other Topics Concern  . Not on file  Social History Narrative   Works in Psychologist, educational. Retired.   Lives alone, has one child in [place].     Review of Systems  Constitutional: Positive for unexpected weight change.  Cardiovascular: Negative for chest pain and leg swelling.  Musculoskeletal: Positive for arthralgias and back pain.  Psychiatric/Behavioral: Positive for sleep disturbance.  All other systems reviewed and are negative.   Vitals:   05/16/18 1019  BP: (!) 142/78  Pulse: 72  SpO2: 96%     Physical Exam  Constitutional: He is oriented to person, place, and time. He appears well-developed and well-nourished.  HENT:  Head: Normocephalic and atraumatic.  Mallampati 3  Eyes: Pupils are equal, round,  and reactive to light. Conjunctivae are normal. Right eye exhibits no discharge. Left eye exhibits no discharge.  Neck: Normal range of motion. Neck supple. No tracheal deviation present. No thyromegaly present.  Cardiovascular: Normal rate and regular rhythm.  Pulmonary/Chest: Effort normal and breath sounds normal. No respiratory distress. He has no wheezes. He has no rales.  Abdominal: Soft. Bowel sounds are normal. He exhibits no distension. There is no tenderness.  Musculoskeletal: Normal range of motion. He exhibits no edema.  Neurological: He is alert and oriented to person, place, and time. He has normal reflexes. No cranial nerve deficit.  Skin: Skin is warm and dry. No erythema.  Psychiatric: He has a normal mood and affect.   Assessment:   Nonrestorative sleep  Possible restless leg syndrome  Sleep maintenance insomnia -May be worsened by his chronic back pain and discomfort, may also be affected by possibility of restless legs  Possible obstructive sleep apnea with weight gain, morning headaches, daytime sleepiness -This may be secondary to not getting adequate sleep at night  Plan/Recommendations:  Sleep restriction may help consolidate his sleep  Encouraged to avoid daytime naps and to keep it below 30 minutes if he has to  Try and spend just about 7 hours in bed  We will give him a prescription for Lunesta for sleep maintenance insomnia  Sleep disordered breathing was discussed with the patient and will rule this out by obtaining a home sleep study  Regular exercise and weight loss will also help with optimizing his sleep  I will see him back in the office in about 6 weeks  We will get iron studies to rule out iron deficiency  His back pain and discomfort may be contributing to multiple awakenings  Encouraged to call with any significant concerns   Sherrilyn Rist MD [facility] 05/16/2018, 11:04 AM  CC: Caren Macadam,  MD

## 2018-05-16 NOTE — Patient Instructions (Signed)
Patient with nonrestorative sleep, insomnia  History of recent weight gain  Possibility of sleep disordered breathing  Possibility of restless legs-we will get iron studies  I will see you back in the office in about 4 to 6 weeks  Sleep restriction as discussed  Regular exercise and weight loss as able  We will call you in a medication to help you stay asleep

## 2018-05-17 LAB — IRON AND TIBC
IRON SATURATION: 78 % — AB (ref 15–55)
IRON: 219 ug/dL — AB (ref 38–169)
TIBC: 279 ug/dL (ref 250–450)
UIBC: 60 ug/dL — AB (ref 111–343)

## 2018-05-17 NOTE — Telephone Encounter (Signed)
-----   Message from Laurin Coder, MD sent at 05/17/2018 11:03 AM EST ----- Regarding: FYI  ! Bringing his iron studies to your attention  I was questioning restless legs  ?  Liver function test, possibility of hemochromatosis?

## 2018-05-17 NOTE — Telephone Encounter (Signed)
ATC unable to leave a voicemail. CRM created.

## 2018-05-17 NOTE — Telephone Encounter (Signed)
I was copied from pulmonology on recent bloodwork. His iron stores are quite high. Please make sure he is not taking any iron supplementation. These can affect the liver and could contribute to other health issues. I would recommend he return for the CBC and CMP that I have already ordered (tell him to tell lab NOT to do the A1C). Also recommend putting in hematology referral (unless taking iron supplement) as he will likely need to see blood specialist to address iron overload.

## 2018-05-20 ENCOUNTER — Other Ambulatory Visit: Payer: Self-pay | Admitting: Family Medicine

## 2018-05-20 NOTE — Telephone Encounter (Signed)
Pt is returning Lindsay's call.   CB: [phone]

## 2018-05-20 NOTE — Telephone Encounter (Signed)
Lab has been ordered.

## 2018-05-20 NOTE — Telephone Encounter (Signed)
Spoke with patient, He is not taking Iron. Pt stated that his iron has always been high and that he has sent hematology in the past. He was told to donate blood to bring his levels down. Pt declines referral. Lab appointment made for 05/29/18. Patient is aware not to have A1C tested, this has also been put in the appointment notes.

## 2018-05-20 NOTE — Telephone Encounter (Signed)
I do see old hematology note from past (last 2015). If he has appt in dec that gives [place] chance to recheck. Please add ferritin to labs with dx of hemochromatosis. If levels still elevated we will rediscuss referral option. Would like to keep this option still on table since elevated iron can cause problems with all organs if not managed/controlled.

## 2018-05-20 NOTE — Telephone Encounter (Signed)
ATC unable to leave a voice mail. 

## 2018-05-22 ENCOUNTER — Telehealth: Payer: Self-pay | Admitting: Pulmonary Disease

## 2018-05-22 NOTE — Telephone Encounter (Signed)
Medication name and strength: Lunesta 2mg   Provider: AO Pharmacy: [facility] on [address] Patient insurance ID: [id number] Phone: [phone] Fax:   Was the PA started on CMM?  Yes If yes, please enter the Key: [id number] Timeframe for approval/denial: 24-72 hrs  Will route to [facility] for follow up.

## 2018-05-24 NOTE — Telephone Encounter (Signed)
Checked on status of PA, per CMM it is still pending. Will continue to await insurance's decision.

## 2018-05-24 NOTE — Telephone Encounter (Signed)
Received a fax from [facility] stating that patient's medication has been approved from 05/23/18 through 06/26/2019. Called [facility] to make them aware, no one answered the phone. Will leave this encounter open until pharmacy is aware of the approval.

## 2018-05-29 ENCOUNTER — Other Ambulatory Visit: Payer: PPO

## 2018-05-30 ENCOUNTER — Ambulatory Visit: Payer: PPO | Admitting: Endocrinology

## 2018-05-30 ENCOUNTER — Ambulatory Visit: Payer: PPO | Admitting: Dietician

## 2018-06-03 NOTE — Telephone Encounter (Signed)
Pharmacy is aware and patient has picked up medication.

## 2018-06-13 DIAGNOSIS — H1012 Acute atopic conjunctivitis, left eye: Secondary | ICD-10-CM | POA: Diagnosis not present

## 2018-06-27 ENCOUNTER — Ambulatory Visit: Payer: PPO | Admitting: Pulmonary Disease

## 2018-06-27 DIAGNOSIS — J300 Vasomotor rhinitis: Secondary | ICD-10-CM | POA: Diagnosis not present

## 2018-06-27 DIAGNOSIS — J324 Chronic pansinusitis: Secondary | ICD-10-CM | POA: Diagnosis not present

## 2018-06-28 DIAGNOSIS — H1012 Acute atopic conjunctivitis, left eye: Secondary | ICD-10-CM | POA: Diagnosis not present

## 2018-07-02 ENCOUNTER — Telehealth: Payer: Self-pay | Admitting: Gastroenterology

## 2018-07-02 ENCOUNTER — Other Ambulatory Visit: Payer: Self-pay | Admitting: Family Medicine

## 2018-07-02 ENCOUNTER — Other Ambulatory Visit: Payer: Self-pay | Admitting: Gastroenterology

## 2018-07-02 MED ORDER — PANTOPRAZOLE SODIUM 40 MG PO TBEC: 40.0000 mg | DELAYED_RELEASE_TABLET | Freq: Two times a day (BID) | ORAL | 8 refills | Status: DC

## 2018-07-02 MED ORDER — LEVOTHYROXINE SODIUM 125 MCG PO TABS: 125.0000 ug | ORAL_TABLET | Freq: Every day | ORAL | 0 refills | Status: DC

## 2018-07-02 NOTE — Telephone Encounter (Signed)
Pantoprazole 40 mg #60 with 8 refills sent to [facility]. Tried to notify patient. No voicemail set up

## 2018-07-02 NOTE — Telephone Encounter (Signed)
Last fill 05/14/18 filled by Dr. Loanne Drilling Last OV 04/29/18  Ok top send to new pharmacy?

## 2018-07-02 NOTE — Telephone Encounter (Signed)
Patient needs a refill called in for Levothyroxine 0.125 mg tab to [facility].  Patient wants medicine sent to new pharmacy and updated in his chart.  Patient DOES NOT use [facility] anymore.   New Pharmacy info:  [facility] [address] [place], [place] [address] [email or web address] [phone] Fax- [phone]

## 2018-07-09 ENCOUNTER — Telehealth: Payer: Self-pay | Admitting: *Deleted

## 2018-07-09 NOTE — Telephone Encounter (Signed)
Spoke with [employer] from [facility]. PA was for name brand medication due to side effects with metformin. PA has been approved for 1 year.

## 2018-07-09 NOTE — Telephone Encounter (Signed)
Copied from [place] [id number]. Topic: General - Other >> Jul 09, 2018 10:43 AM Carolyn Stare wrote:  Vicente Males with [facility] would like a call back about a PA he need to have a few questions answered. GLUCOPHAGE EXTENDED RELEASE 500 MG TABLET    [phone]   option 3 Ref  number [id number]

## 2018-07-17 DIAGNOSIS — J309 Allergic rhinitis, unspecified: Secondary | ICD-10-CM | POA: Diagnosis not present

## 2018-07-17 DIAGNOSIS — J3089 Other allergic rhinitis: Secondary | ICD-10-CM | POA: Diagnosis not present

## 2018-07-23 ENCOUNTER — Telehealth: Payer: Self-pay | Admitting: Pulmonary Disease

## 2018-07-23 NOTE — Telephone Encounter (Signed)
I called today trying to get the home sleep study scheduled with this patient. Dr. Ander Slade ordered the [facility] 07/08/2018. The patient is now scheduled to p/u HST machine 07/24/2018 @ 10:00am. Mr. Pollard also wanted another Rx for Lunesta/ Generic Eszopiclone. Dr. Ander Slade gave him a Rx and 2 refills and Mr. Godsey states that it is helping him stay asleep.

## 2018-07-23 NOTE — Telephone Encounter (Signed)
ATC pt-no answer and unable to leave VM

## 2018-07-24 DIAGNOSIS — G4733 Obstructive sleep apnea (adult) (pediatric): Secondary | ICD-10-CM

## 2018-07-25 DIAGNOSIS — G4733 Obstructive sleep apnea (adult) (pediatric): Secondary | ICD-10-CM | POA: Diagnosis not present

## 2018-07-26 ENCOUNTER — Telehealth: Payer: Self-pay

## 2018-07-26 DIAGNOSIS — G4733 Obstructive sleep apnea (adult) (pediatric): Secondary | ICD-10-CM

## 2018-07-26 NOTE — Addendum Note (Signed)
Addended by: Georjean Mode on: 07/26/2018 01:13 PM   Modules accepted: Orders

## 2018-07-26 NOTE — Telephone Encounter (Signed)
Called and spoke with patient regarding results.  Informed the patient of results and recommendations today. Placed order today for auto cpap machine 5-15cm with mask of choice, and supplies. Scheduled appt for 73mo f/u for OL on 09/27/2018 at 11:30am Pt verbalized understanding and denied any questions or concerns at this time.  Nothing further needed.   Dr. Ander Slade has reviewed the home sleep test this showed moderate OSA, mild O2 desaturations, the lowest Sa)2 was 82%, patient AHI 18.3/hour.   Recommendations   Treatment options are CPAP with the settings auto 5-15.   Weight loss measures .  Advise against driving while sleepy & against medication with sedative side effects.   Make appointment for 3 months for compliance with download with Dr. Ander Slade.

## 2018-07-31 ENCOUNTER — Other Ambulatory Visit (INDEPENDENT_AMBULATORY_CARE_PROVIDER_SITE_OTHER): Payer: PPO

## 2018-07-31 DIAGNOSIS — E1169 Type 2 diabetes mellitus with other specified complication: Secondary | ICD-10-CM

## 2018-07-31 DIAGNOSIS — Z23 Encounter for immunization: Secondary | ICD-10-CM | POA: Diagnosis not present

## 2018-07-31 DIAGNOSIS — D485 Neoplasm of uncertain behavior of skin: Secondary | ICD-10-CM | POA: Diagnosis not present

## 2018-07-31 DIAGNOSIS — C44519 Basal cell carcinoma of skin of other part of trunk: Secondary | ICD-10-CM | POA: Diagnosis not present

## 2018-07-31 DIAGNOSIS — L814 Other melanin hyperpigmentation: Secondary | ICD-10-CM | POA: Diagnosis not present

## 2018-07-31 DIAGNOSIS — I10 Essential (primary) hypertension: Secondary | ICD-10-CM

## 2018-07-31 LAB — COMPREHENSIVE METABOLIC PANEL
ALBUMIN: 4.4 g/dL (ref 3.5–5.2)
ALK PHOS: 72 U/L (ref 39–117)
ALT: 38 U/L (ref 0–53)
AST: 23 U/L (ref 0–37)
BUN: 20 mg/dL (ref 6–23)
CO2: 21 mEq/L (ref 19–32)
Calcium: 8.8 mg/dL (ref 8.4–10.5)
Chloride: 105 mEq/L (ref 96–112)
Creatinine, Ser: 1.19 mg/dL (ref 0.40–1.50)
GFR: 61.1 mL/min (ref >60.00)
GLUCOSE: 158 mg/dL — AB (ref 70–99)
Potassium: 4 mEq/L (ref 3.5–5.1)
SODIUM: 140 meq/L (ref 135–145)
TOTAL PROTEIN: 6.7 g/dL (ref 6.0–8.3)
Total Bilirubin: 1.1 mg/dL (ref 0.2–1.2)

## 2018-07-31 LAB — CBC WITH DIFFERENTIAL/PLATELET
Basophils Absolute: 0 10*3/uL (ref 0.0–0.1)
Basophils Relative: 0.8 % (ref 0.0–3.0)
EOS PCT: 2.9 % (ref 0.0–5.0)
Eosinophils Absolute: 0.1 10*3/uL (ref 0.0–0.7)
HCT: 42.9 % (ref 39.0–52.0)
HEMOGLOBIN: 14.7 g/dL (ref 13.0–17.0)
LYMPHS ABS: 1.1 10*3/uL (ref 0.7–4.0)
Lymphocytes Relative: 22.7 % (ref 12.0–46.0)
MCHC: 34.3 g/dL (ref 30.0–36.0)
MCV: 97.3 fl (ref 78.0–100.0)
MONO ABS: 0.4 10*3/uL (ref 0.1–1.0)
Monocytes Relative: 8.1 % (ref 3.0–12.0)
NEUTROS PCT: 65.5 % (ref 43.0–77.0)
Neutro Abs: 3.3 10*3/uL (ref 1.4–7.7)
Platelets: 177 10*3/uL (ref 150.0–400.0)
RBC: 4.41 Mil/uL (ref 4.22–5.81)
RDW: 12.5 % (ref 11.5–15.5)
WBC: 5 10*3/uL (ref 4.0–10.5)

## 2018-07-31 LAB — MICROALBUMIN / CREATININE URINE RATIO
Creatinine,U: 222 mg/dL
MICROALB/CREAT RATIO: 5.9 mg/g (ref 0.0–30.0)
Microalb, Ur: 13.1 mg/dL — ABNORMAL HIGH (ref 0.0–1.9)

## 2018-07-31 LAB — FERRITIN: Ferritin: 395.6 ng/mL — ABNORMAL HIGH (ref 22.0–322.0)

## 2018-07-31 LAB — HEMOGLOBIN A1C: HEMOGLOBIN A1C: 6.8 % — AB (ref 4.6–6.5)

## 2018-08-05 ENCOUNTER — Telehealth: Payer: Self-pay | Admitting: Pulmonary Disease

## 2018-08-05 ENCOUNTER — Other Ambulatory Visit: Payer: Self-pay | Admitting: Pulmonary Disease

## 2018-08-05 MED ORDER — ESZOPICLONE 2 MG PO TABS: 2.0000 mg | ORAL_TABLET | Freq: Every evening | ORAL | 3 refills | Status: DC | PRN

## 2018-08-05 NOTE — Telephone Encounter (Signed)
Refill sent.

## 2018-08-05 NOTE — Telephone Encounter (Signed)
Atc phone just rang repeatedly will call back

## 2018-08-05 NOTE — Telephone Encounter (Signed)
Spoke with pt. He is requesting a refill on Eszopiclone 2mg . This was last refilled on 05/16/18 #30 with 1 additional refill.  Dr. Ander Slade - please advise on refill. Thanks.

## 2018-08-06 NOTE — Telephone Encounter (Signed)
Called patient, unable to reach and unable to leave voicemail.

## 2018-08-07 ENCOUNTER — Telehealth: Payer: Self-pay | Admitting: Pulmonary Disease

## 2018-08-07 ENCOUNTER — Encounter: Payer: Self-pay | Admitting: Family Medicine

## 2018-08-07 ENCOUNTER — Ambulatory Visit (INDEPENDENT_AMBULATORY_CARE_PROVIDER_SITE_OTHER): Payer: PPO | Admitting: Family Medicine

## 2018-08-07 VITALS — BP 120/80 | HR 63 | Temp 98.0°F | Wt 193.8 lb

## 2018-08-07 DIAGNOSIS — E1169 Type 2 diabetes mellitus with other specified complication: Secondary | ICD-10-CM

## 2018-08-07 DIAGNOSIS — E785 Hyperlipidemia, unspecified: Secondary | ICD-10-CM

## 2018-08-07 DIAGNOSIS — I10 Essential (primary) hypertension: Secondary | ICD-10-CM

## 2018-08-07 MED ORDER — METFORMIN HCL ER 500 MG PO TB24: 500.0000 mg | ORAL_TABLET | Freq: Every day | ORAL | 1 refills | Status: DC

## 2018-08-07 NOTE — Telephone Encounter (Signed)
Spoke with pt. He is has already picked this up from the pharmacy. Nothing further was needed.

## 2018-08-07 NOTE — Progress Notes (Signed)
Jeff Lopez DOB: [birth year] Encounter date: 08/07/2018  This is a 68 y.o. male who presents for a chronic condition visit.     Chief Complaint  Patient presents with  . Follow-up   Unable to reach him with lab results. A1C improved from 7.8 to 6.8. At last visit we discussed working on exercise to improve sugars/health overall.  Has been walking about 2 miles daily. Back pain is doing well. He is really excited about starting this and getting health on track.   Has been doing a lot of reading on diabetes. Metformin does bother him still; insurance did approve glucophage but he hasn't started this yet. Wants to get off of all medications. Cut out red meat, really increasing vegetables overall.   Went to nutritionist and they did go over things, but he states that he pretty much knew about what he should/shouldn't be eating.   Sleep study results - they recommended CPAP. He isn't sure about doing this. Jeff Lopez is working well for him in terms of staying asleep. Has glitch in phone where they tell him they cannot set up voicemail. He states that he is getting calls through phone; provider did confirm numbers.   Has seen specialist for iron overload in past. Was told there was no problem (but states that if he gives blood regularly it stays normal).   Has noted significant improvement in energy with vitamin D daily.   Diabetes Mellitus Type 2: Current symptoms/problems include none.  Medication compliance:  compliant most of the time Diabetic diet compliance:  compliant most of the time,  Weight trend:decreasing Current exercise: walking 2 miles daily Barriers: frustration- upset that he is only diabetic in family; upset with diabetes dx.  Home blood sugarrecords: not checking Any episodes of hypoglycemia? None known Eye exam current (within one year): yes; per patient  reports that he quit smoking about 11 years ago. His smoking use included cigarettes. He has a 22.00 pack-year  smoking history. He has never used smokeless tobacco.  Daily Aspirin? No ACE/ARB? Yes Statin? No: patient declines; will continue to discuss  Hypertension:  Home blood pressure monitoring: no.  He is not adherent to a low sodium diet. Patient denies chest pain, shortness of breath, peripheral edema, palpitations.  Antihypertensive medication side effects: no medication side effects noted.  Use of agentsassociated with hypertension: none.   Hyperlipidemia:  Has improved diet.    A1C:No results found for: LABA1C Micro:  Lab Results  Component Value Date   CREATININE 1.19 07/31/2018   CREATININE 1.18 09/10/2015   CBC:  Lab Results  Component Value Date   WBC 5.0 07/31/2018   HGB 14.7 07/31/2018   HGB 13.8 04/30/2014   HCT 42.9 07/31/2018   HCT 42.0 04/30/2014   MCH 33.5 04/23/2015   MCHC 34.3 07/31/2018   RDW 12.5 07/31/2018   RDW 13.1 04/30/2014   PLT 177.0 07/31/2018   PLT 215 04/30/2014   CMP:  Lab Results  Component Value Date   NA 140 07/31/2018   NA 140 07/05/2017   K 4.0 07/31/2018   CL 105 07/31/2018   CO2 21 07/31/2018   ANIONGAP 12 04/21/2015   GLUCOSE 158 (H) 07/31/2018   BUN 20 07/31/2018   BUN 19 07/05/2017   CREATININE 1.19 07/31/2018   CREATININE 1.18 09/10/2015   GFRAA 67 07/05/2017   CALCIUM 8.8 07/31/2018   PROT 6.7 07/31/2018   BILITOT 1.1 07/31/2018   ALKPHOS 72 07/31/2018   ALT 38 07/31/2018  AST 23 07/31/2018   LIPID:  Lab Results  Component Value Date   CHOL 124 04/23/2018   TRIG 163.0 (H) 04/23/2018   HDL 30.90 (L) 04/23/2018   LDLCALC 61 04/23/2018        Allergies  Allergen Reactions  . Ceftin Other (See Comments)    Patient stated it caused "sores in mouth" Thrush  . Cefuroxime Axetil Other (See Comments)    Sores in mouth Sores in mouth Patient stated it caused "sores in mouth" Thrush other   Current Meds  Medication Sig  . aspirin-acetaminophen-caffeine (EXCEDRIN MIGRAINE) 250-250-65 MG tablet Take by mouth  every 6 (six) hours as needed for headache.  . eszopiclone (LUNESTA) 2 MG TABS tablet Take 1 tablet (2 mg total) by mouth at bedtime as needed for sleep. Take immediately before bedtime  . irbesartan (AVAPRO) 300 MG tablet TAKE 1 TABLET(300 MG) BY MOUTH DAILY  . levothyroxine (SYNTHROID, LEVOTHROID) 125 MCG tablet Take 1 tablet (125 mcg total) by mouth daily before breakfast.  . metoprolol succinate (TOPROL-XL) 50 MG 24 hr tablet TAKE 1 TABLET BY MOUTH DAILY  . pantoprazole (PROTONIX) 40 MG tablet Take 1 tablet (40 mg total) by mouth 2 (two) times daily.  . [DISCONTINUED] metFORMIN (GLUCOPHAGE-XR) 500 MG 24 hr tablet      Review of Systems  Constitutional: Negative for chills, fatigue and fever.  Respiratory: Negative for cough, chest tightness, shortness of breath and wheezing.   Cardiovascular: Negative for chest pain, palpitations and leg swelling.     Objective:  BP 120/80 (BP Location: Left Arm, Patient Position: Sitting, Cuff Size: Normal)   Pulse 63   Temp 98 F (36.7 C) (Oral)   Wt 193 lb 12.8 oz (87.9 kg)   SpO2 96%   BMI 28.62 kg/m   Weight: 193 lb 12.8 oz (87.9 kg)   BP Readings from Last 3 Encounters:  08/07/18 120/80  05/16/18 (!) 142/78  04/29/18 130/62   Wt Readings from Last 3 Encounters:  08/07/18 193 lb 12.8 oz (87.9 kg)  05/16/18 205 lb 12.8 oz (93.4 kg)  04/29/18 206 lb 12.8 oz (93.8 kg)    Physical Exam Constitutional:      General: He is not in acute distress.    Appearance: He is well-developed.  Cardiovascular:     Rate and Rhythm: Normal rate and regular rhythm.     Heart sounds: Normal heart sounds. No murmur. No friction rub.  Pulmonary:     Effort: Pulmonary effort is normal. No respiratory distress.     Breath sounds: Normal breath sounds. No wheezing or rales.  Musculoskeletal:     Right lower leg: No edema.     Left lower leg: No edema.  Neurological:     Mental Status: He is alert and oriented to person, place, and time.   Psychiatric:        Behavior: Behavior normal.     Assessment/Plan: Health Maintenance Due  Topic Date Due  . OPHTHALMOLOGY EXAM  02/27/2018   Health Maintenance reviewed.   Goals Addressed   None   1. HYPERTENSION, BENIGN Continue current medication. Will continue to monitor as patient continues to improve exercise/diet as we may be able to decrease dose. - CBC with Differential/Platelet; Future - Comprehensive metabolic panel; Future  2. Hemochromatosis, unspecified hemochromatosis type He has seen hematology in past. Does not wish to see specialist again. We will continue to monitor ferritin levels every few months; encouraged him to continue donating blood. If his ferritin  elevates he is willing to see specialist. - CBC with Differential/Platelet; Future - Ferritin; Future  3. Type 2 diabetes mellitus with other specified complication, without long-term current use of insulin (HCC) Improved A1C. Congratulated him on lifestyle changes he has beeen making. They are making large difference. Diarrhea has been better with healthier diet, but still with some loose stools. Glucaphage approved, he will try to get and try this. He really wants to stop diabetic meds; I think reasonable to give another 3 months time with trial of glucophage and continued effort for eating/weight loss (also still drinking regular dr pepper multiple times daily) before stopping medication. - metFORMIN (GLUCOPHAGE XR) 500 MG 24 hr tablet; Take 1 tablet (500 mg total) by mouth daily with breakfast.  Dispense: 90 tablet; Refill: 1 - Hemoglobin A1c; Future  4. Hyperlipidemia associated with type 2 diabetes mellitus ([place]) Will recheck in 3 months.  - Lipid panel; Future -continue current meds -reviewed labs -encouraged a healthy diet, regular exercise and maintaining a healthy weight -eye form given: no; he is regularly getting exams. Release completed today.   Return in about 3 months (around 11/05/2018)  for Chronic condition visit; bloodwork prior to visit.    Micheline Rough, MD

## 2018-08-07 NOTE — Telephone Encounter (Signed)
[facility] has tried multiple time to reach out to the patient I have tried to speak with him today phone rang busy I will reach back out so we can get him set up on cpap

## 2018-08-08 ENCOUNTER — Other Ambulatory Visit: Payer: Self-pay | Admitting: *Deleted

## 2018-08-08 DIAGNOSIS — G478 Other sleep disorders: Secondary | ICD-10-CM

## 2018-08-11 ENCOUNTER — Other Ambulatory Visit: Payer: Self-pay | Admitting: Endocrinology

## 2018-08-11 NOTE — Telephone Encounter (Signed)
Please forward refill request to pt's new primary care provider.  

## 2018-08-14 ENCOUNTER — Telehealth: Payer: Self-pay | Admitting: Family Medicine

## 2018-08-14 NOTE — Telephone Encounter (Signed)
Noted  

## 2018-08-14 NOTE — Telephone Encounter (Signed)
Pt came into the office with a letter that was mailed to him on 2/14 and he had discussed the lab results on 08/07/18 with the provider and stated that everything was going good.  His number on file is correct and will start looking out for our number to see if there is something different in his labs.

## 2018-08-20 NOTE — Telephone Encounter (Signed)
lmom 

## 2018-08-26 LAB — HM DIABETES EYE EXAM

## 2018-09-11 ENCOUNTER — Ambulatory Visit (INDEPENDENT_AMBULATORY_CARE_PROVIDER_SITE_OTHER): Payer: PPO | Admitting: Specialist

## 2018-09-17 NOTE — Telephone Encounter (Signed)
Med was refilled for pt 08/05/2018 with 3 additional refills and pt also had HST. Nothing further needed.

## 2018-09-27 ENCOUNTER — Ambulatory Visit: Payer: PPO | Admitting: Pulmonary Disease

## 2018-10-30 ENCOUNTER — Other Ambulatory Visit: Payer: Self-pay

## 2018-10-30 ENCOUNTER — Other Ambulatory Visit (INDEPENDENT_AMBULATORY_CARE_PROVIDER_SITE_OTHER): Payer: PPO

## 2018-10-30 DIAGNOSIS — E785 Hyperlipidemia, unspecified: Secondary | ICD-10-CM | POA: Diagnosis not present

## 2018-10-30 DIAGNOSIS — I10 Essential (primary) hypertension: Secondary | ICD-10-CM | POA: Diagnosis not present

## 2018-10-30 DIAGNOSIS — E1169 Type 2 diabetes mellitus with other specified complication: Secondary | ICD-10-CM

## 2018-10-30 LAB — HEMOGLOBIN A1C: Hgb A1c MFr Bld: 7.6 % — ABNORMAL HIGH (ref 4.6–6.5)

## 2018-10-30 LAB — COMPREHENSIVE METABOLIC PANEL
ALT: 22 U/L (ref 0–53)
AST: 18 U/L (ref 0–37)
Albumin: 4.3 g/dL (ref 3.5–5.2)
Alkaline Phosphatase: 79 U/L (ref 39–117)
BUN: 23 mg/dL (ref 6–23)
CO2: 23 mEq/L (ref 19–32)
Calcium: 8.8 mg/dL (ref 8.4–10.5)
Chloride: 104 mEq/L (ref 96–112)
Creatinine, Ser: 1.23 mg/dL (ref 0.40–1.50)
GFR: 58.77 mL/min — ABNORMAL LOW (ref >60.00)
Glucose, Bld: 149 mg/dL — ABNORMAL HIGH (ref 70–99)
Potassium: 4 mEq/L (ref 3.5–5.1)
Sodium: 139 mEq/L (ref 135–145)
Total Bilirubin: 1.3 mg/dL — ABNORMAL HIGH (ref 0.2–1.2)
Total Protein: 6.6 g/dL (ref 6.0–8.3)

## 2018-10-30 LAB — CBC WITH DIFFERENTIAL/PLATELET
Basophils Absolute: 0 10*3/uL (ref 0.0–0.1)
Basophils Relative: 0.6 % (ref 0.0–3.0)
Eosinophils Absolute: 0.2 10*3/uL (ref 0.0–0.7)
Eosinophils Relative: 3.6 % (ref 0.0–5.0)
HCT: 46 % (ref 39.0–52.0)
Hemoglobin: 15.9 g/dL (ref 13.0–17.0)
Lymphocytes Relative: 20.3 % (ref 12.0–46.0)
Lymphs Abs: 1.2 10*3/uL (ref 0.7–4.0)
MCHC: 34.6 g/dL (ref 30.0–36.0)
MCV: 94.8 fl (ref 78.0–100.0)
Monocytes Absolute: 0.5 10*3/uL (ref 0.1–1.0)
Monocytes Relative: 8.9 % (ref 3.0–12.0)
Neutro Abs: 4.1 10*3/uL (ref 1.4–7.7)
Neutrophils Relative %: 66.6 % (ref 43.0–77.0)
Platelets: 183 10*3/uL (ref 150.0–400.0)
RBC: 4.85 Mil/uL (ref 4.22–5.81)
RDW: 11.8 % (ref 11.5–15.5)
WBC: 6.1 10*3/uL (ref 4.0–10.5)

## 2018-10-30 LAB — LIPID PANEL
Cholesterol: 141 mg/dL (ref 0–200)
HDL: 36 mg/dL — ABNORMAL LOW (ref >39.00)
LDL Cholesterol: 79 mg/dL (ref 0–99)
NonHDL: 104.53
Total CHOL/HDL Ratio: 4
Triglycerides: 127 mg/dL (ref 0.0–149.0)
VLDL: 25.4 mg/dL (ref 0.0–40.0)

## 2018-10-30 LAB — FERRITIN: Ferritin: 360.5 ng/mL — ABNORMAL HIGH (ref 22.0–322.0)

## 2018-11-06 ENCOUNTER — Encounter: Payer: Self-pay | Admitting: Family Medicine

## 2018-11-06 ENCOUNTER — Ambulatory Visit (INDEPENDENT_AMBULATORY_CARE_PROVIDER_SITE_OTHER): Payer: PPO | Admitting: Family Medicine

## 2018-11-06 ENCOUNTER — Other Ambulatory Visit: Payer: Self-pay

## 2018-11-06 DIAGNOSIS — E1169 Type 2 diabetes mellitus with other specified complication: Secondary | ICD-10-CM

## 2018-11-06 DIAGNOSIS — E890 Postprocedural hypothyroidism: Secondary | ICD-10-CM | POA: Diagnosis not present

## 2018-11-06 DIAGNOSIS — J324 Chronic pansinusitis: Secondary | ICD-10-CM | POA: Diagnosis not present

## 2018-11-06 DIAGNOSIS — E1143 Type 2 diabetes mellitus with diabetic autonomic (poly)neuropathy: Secondary | ICD-10-CM | POA: Diagnosis not present

## 2018-11-06 DIAGNOSIS — E785 Hyperlipidemia, unspecified: Secondary | ICD-10-CM

## 2018-11-06 DIAGNOSIS — E538 Deficiency of other specified B group vitamins: Secondary | ICD-10-CM | POA: Diagnosis not present

## 2018-11-06 NOTE — Progress Notes (Signed)
Virtual Visit via Video Note  I connected with Jeff Lopez  on 11/06/18 at  9:30 AM EDT by a video enabled telemedicine application and verified that I am speaking with the correct person using two identifiers.  Location patient: home Location provider:work or home office Persons participating in the virtual visit: patient, provider  I discussed the limitations of evaluation and management by telemedicine and the availability of in person appointments. The patient expressed understanding and agreed to proceed.  Web failure; visit completed by phone.  Jeff Lopez DOB: Jun 20, 1951 Encounter date: 11/06/2018  This is a 68 y.o. male who presents with Chief Complaint  Patient presents with  . Follow-up    History of present illness: Has had bee sting sensation - sometimes in legs, sometimes in feet. Has really started in last month and a half. Has tried gabapentin in past, but didn't feel like he tolerated this very well.   Also states that sinuses are "so out of control". Opened a bottle of A1 Steak sauce and smell caused him to sneeze for 10 minutes. Was seeing Dr. Janace Hoard and had septal correction/sinus surgery. Ever since that surgery he has sneezing, nose running. Sneezing triggered so easily. Does feel congested. If he doesn't wear nose strip at night then he can't breathe. Now when he blows nose he gets blood out. He has tried multiple nasal sprays. Would just like to get another opinion because he feels like he is not making progress. Has been doing nasal saline flush, and it does feel better for a couple hours but then recurs. Doesn't feel like anything is actually "flushed out" with this. Has tried Claritin D, zyrtec, mucinex, flonase, atrovent. Didn't note significant improvement after clindamycin - maybe feels better for a little while but seems to keep coming back. Bleeding started after nasal sprays. Doesn't feel like the sinus pressure is relieved since surgery. Did see  allergist across street from [place] and had testing completed. Shots were recommended, but he did not get these.   At last visit he had started getting health back on track and was walking 2 miles daily. Back pain was doing well. Goal was still getting off all medications. Hasn't been walking like he was; states that coronavirus slowed him down. Hasn't regained weight, but states that he is holding pretty stable with possible decrease. Going to [employer] regularly. See below.  Bloodwork completed 10/30/18. Slight elevation bilirubin (1.3). Worsening A1C from 6.8 to 7.6. Ferritin stable (slightly improved at 360)   HPI   DM: has been eating pretty well overall. He is drinking soda. Not checking sugars at home currently. Wants to get restarted with walking since he really enjoyed being able to do this.   HTN: has been checking blood pressures at home. Running 120/80's.   HL: does not want to take statins. Will continue to monitor/discuss.   Started with vitamin D a few months ago and it helped with energy level.   Allergies  Allergen Reactions  . Ceftin Other (See Comments)    Patient stated it caused "sores in mouth" Thrush  . Cefuroxime Axetil Other (See Comments)    Sores in mouth Sores in mouth Patient stated it caused "sores in mouth" Thrush other   Current Meds  Medication Sig  . aspirin-acetaminophen-caffeine (EXCEDRIN MIGRAINE) 250-250-65 MG tablet Take by mouth every 6 (six) hours as needed for headache.  . eszopiclone (LUNESTA) 2 MG TABS tablet Take 1 tablet (2 mg total) by mouth at bedtime  as needed for sleep. Take immediately before bedtime  . irbesartan (AVAPRO) 300 MG tablet TAKE 1 TABLET(300 MG) BY MOUTH DAILY  . levothyroxine (SYNTHROID, LEVOTHROID) 125 MCG tablet Take 1 tablet (125 mcg total) by mouth daily before breakfast.  . metFORMIN (GLUCOPHAGE XR) 500 MG 24 hr tablet Take 1 tablet (500 mg total) by mouth daily with breakfast.  . metoprolol succinate (TOPROL-XL)  50 MG 24 hr tablet TAKE 1 TABLET BY MOUTH DAILY  . pantoprazole (PROTONIX) 40 MG tablet Take 1 tablet (40 mg total) by mouth 2 (two) times daily.    Review of Systems  Constitutional: Negative for chills, fatigue and fever.  HENT: Positive for congestion, nosebleeds and sinus pressure.   Respiratory: Negative for cough, chest tightness, shortness of breath and wheezing.   Cardiovascular: Negative for chest pain, palpitations and leg swelling.  Neurological: Positive for numbness.    Objective:  There were no vitals taken for this visit.      BP Readings from Last 3 Encounters:  08/07/18 120/80  05/16/18 (!) 142/78  04/29/18 130/62   Wt Readings from Last 3 Encounters:  08/07/18 193 lb 12.8 oz (87.9 kg)  05/16/18 205 lb 12.8 oz (93.4 kg)  04/29/18 206 lb 12.8 oz (93.8 kg)    EXAM:  GENERAL: alert, oriented, appears well and in no acute distress  LUNGS: no respiratory distress noted.   PSYCH/NEURO: pleasant and cooperative, no obvious depression or anxiety, speech and thought processing grossly intact   Assessment/Plan 1. Chronic pansinusitis He would like to get a second opinion for his sinus issues.  He has suffered for years with this and is not feeling like he is noting any improvement.  Continuous drainage bothersome.  Antibiotics do not seem to help him or provide any lasting relief. - Ambulatory referral to ENT  2. Hypothyroidism following radioiodine therapy Continue Synthroid. - TSH; Future  3. Hemochromatosis, unspecified hemochromatosis type Iron levels are stable.  He has seen hematology in the past.  As long as ferritin levels are remaining below 400 we will continue to monitor. - CBC with Differential/Platelet; Future - Comprehensive metabolic panel; Future  4. Type II diabetes mellitus with peripheral autonomic neuropathy [facility]) He still feels like he is "not diabetic" in spite of discussions about diagnostic A1c.  A1c is worsened due to lapse in diet  control and decreased exercise.  We discussed this in detail today.  He does plan to get back on track with walking and to work on healthier eating.  He saw at last visit there is a direct correlation with him "behaving" and his sugar control.  We will continue to work on decreasing soda intake.  See documentation under B12 diagnoses below.  We discussed how good sugar control is important to help protect the nerves and that high sugars will increase nerve damage and neuropathy.  He has tolerated gabapentin in the past and would consider taking this again if neuropathy is worsening. - Hemoglobin A1c; Future - Microalbumin / creatinine urine ratio; Future  5. Hyperlipidemia associated with type 2 diabetes mellitus ([place]) We will plan to recheck cholesterol and keep an eye on these numbers.  It would be advised for him to be on a statin and we will continue to discuss this. - Lipid panel; Future  6. Low serum vitamin B12 His previous B12 levels taken last year were in the low normal range.  I encouraged him to take 1000 mcg daily and see if this helps with some of  the neuropathy he has been experiencing.  Let me know if neuropathy is worsening, otherwise we will do a follow-up with this in 3 months time. - Vitamin B12; Future - Folate; Future  Return in about 3 months (around 02/06/2019), or bloodwork and then CCV.    I discussed the assessment and treatment plan with the patient. The patient was provided an opportunity to ask questions and all were answered. The patient agreed with the plan and demonstrated an understanding of the instructions.   The patient was advised to call back or seek an in-person evaluation if the symptoms worsen or if the condition fails to improve as anticipated.  I provided 30 minutes of non-face-to-face time during this encounter.   Micheline Rough, MD

## 2018-11-07 DIAGNOSIS — J329 Chronic sinusitis, unspecified: Secondary | ICD-10-CM | POA: Diagnosis not present

## 2018-11-07 DIAGNOSIS — J310 Chronic rhinitis: Secondary | ICD-10-CM | POA: Diagnosis not present

## 2018-11-08 ENCOUNTER — Other Ambulatory Visit: Payer: Self-pay | Admitting: Family Medicine

## 2018-11-15 ENCOUNTER — Other Ambulatory Visit: Payer: Self-pay

## 2018-11-15 ENCOUNTER — Encounter: Payer: Self-pay | Admitting: Specialist

## 2018-11-15 ENCOUNTER — Ambulatory Visit (INDEPENDENT_AMBULATORY_CARE_PROVIDER_SITE_OTHER): Payer: PPO | Admitting: Specialist

## 2018-11-15 VITALS — BP 124/66 | HR 62 | Ht 69.0 in | Wt 194.0 lb

## 2018-11-15 DIAGNOSIS — S76312A Strain of muscle, fascia and tendon of the posterior muscle group at thigh level, left thigh, initial encounter: Secondary | ICD-10-CM | POA: Diagnosis not present

## 2018-11-15 DIAGNOSIS — Z981 Arthrodesis status: Secondary | ICD-10-CM

## 2018-11-15 MED ORDER — METAXALONE 800 MG PO TABS: 800.0000 mg | ORAL_TABLET | Freq: Three times a day (TID) | ORAL | Status: DC

## 2018-11-15 MED ORDER — NAPROXEN 500 MG PO TABS: 500.0000 mg | ORAL_TABLET | Freq: Three times a day (TID) | ORAL | 2 refills | Status: DC

## 2018-11-15 NOTE — Patient Instructions (Signed)
The main ways of treat muscular strain of the, that are found to be success. Weight loss helps to decrease stress on the legs. Exercise is important to maintaining flexibilty and strengthening. NSAIDs like tylenol, naprosyn are meds decreasing the inflamation. Skelaxin 800 mg po BID or TID. Ice is okay  In afternoon and evening and hot shower in the am Please call in in 2 weeks if the pain is not improving and we would order physical therapy.

## 2018-11-15 NOTE — Telephone Encounter (Signed)
Pt is in the office and would like to see if someone can call in his levothyroxine 125 MCG   Pharm:  [facility] on [address].  Pt state that he is of the medication and the pharmacy will be closed on Monday and he would like to see if he could get it this weekend.

## 2018-11-15 NOTE — Progress Notes (Signed)
Office Visit Note   Patient: Jeff Lopez           Date of Birth: September 12, 1950           MRN: [id number] Visit Date: 11/15/2018              Requested by: Caren Macadam, MD [place], [place] [address] PCP: Caren Macadam, MD   Assessment & Plan: Visit Diagnoses:  1. Strain of left hamstring muscle, initial encounter   2. Status post lumbar spinal fusion     Plan: The main ways of treat muscular strain of the, that are found to be success. Weight loss helps to decrease stress on the legs. Exercise is important to maintaining flexibilty and strengthening. NSAIDs like tylenol, naprosyn are meds decreasing the inflamation. Skelaxin 800 mg po BID or TID. Ice is okay  In afternoon and evening and hot shower in the am Please call in in 2 weeks if the pain is not improving and we would order physical therapy.    Follow-Up Instructions: Return in about 4 weeks (around 12/13/2018).   Orders:  No orders of the defined types were placed in this encounter.  No orders of the defined types were placed in this encounter.     Procedures: No procedures performed   Clinical Data: No additional findings.   Subjective: Chief Complaint  Patient presents with  . Left ACL/Hamstring    68 year old male with history of T12 to S1 fusions with TLIFs L1-2 through L5-S1 last radiographs 12/2017 with apparent solid fusion. He reports pain levels are Not there in the back and or previous areas. He reports that he was leaving the house and a cat was on the car and he went to move to remove the cat and he felt a pull In the left posterior thigh and he is having pain with initiating walking and standing and with going from sitting to standing and with initiating change in sitting to standing and  Lying to sitting. No bowel or bladder difficulty.    Review of Systems  Constitutional: Negative.   HENT: Negative.   Eyes: Negative.   Respiratory: Negative.    Cardiovascular: Negative.   Gastrointestinal: Negative.   Endocrine: Negative.   Genitourinary: Negative.   Musculoskeletal: Positive for myalgias.  Skin: Negative.   Allergic/Immunologic: Negative.   Neurological: Negative.   Hematological: Negative.   Psychiatric/Behavioral: Negative.      Objective: Vital Signs: BP 124/66   Pulse 62   Ht 5' 9" (1.753 m)   Wt 194 lb (88 kg)   BMI 28.65 kg/m   Physical Exam Constitutional:      Appearance: He is well-developed.  HENT:     Head: Normocephalic and atraumatic.  Eyes:     Pupils: Pupils are equal, round, and reactive to light.  Neck:     Musculoskeletal: Normal range of motion and neck supple.  Pulmonary:     Effort: Pulmonary effort is normal.     Breath sounds: Normal breath sounds.  Abdominal:     General: Bowel sounds are normal.     Palpations: Abdomen is soft.  Skin:    General: Skin is warm and dry.  Neurological:     Mental Status: He is alert and oriented to person, place, and time.  Psychiatric:        Behavior: Behavior normal.        Thought Content: Thought content normal.  Judgment: Judgment normal.     Back Exam   Tenderness  The patient is experiencing tenderness in the lumbar.  Range of Motion  Extension: abnormal  Flexion: abnormal  Lateral bend right: normal  Lateral bend left: normal  Rotation right: normal  Rotation left: normal   Muscle Strength  The patient has normal back strength. Right Quadriceps:  5/5  Left Quadriceps:  5/5  Right Hamstrings:  5/5  Left Hamstrings:  5/5   Tests  Straight leg raise right: negative Straight leg raise left: positive  Reflexes  Patellar: normal Achilles: normal Biceps: normal Babinski's sign: normal   Other  Toe walk: normal Heel walk: normal Erythema: no back redness Scars: absent      Specialty Comments:  No specialty comments available.  Imaging: No results found.   PMFS History: Patient Active Problem List    Diagnosis Date Noted  . Spinal stenosis, lumbar region, with neurogenic claudication 04/21/2015    Priority: High    Class: Chronic  . Spondylolisthesis of lumbar region 04/21/2015    Priority: High    Class: Chronic  . Postoperative CSF leak 08/10/2012    Priority: High    Class: Acute  . HNP (herniated nucleus pulposus), lumbar 08/06/2012    Priority: High    Class: Acute  . Malignant neoplasm of ascending colon ([place]) 05/16/2018  . Low testosterone 03/02/2018  . Gynecomastia 02/28/2018  . Left lower quadrant pain 12/26/2017  . Dyspnea 10/03/2017  . Arthralgia 10/03/2017  . Numbness 10/03/2017  . Chronic pansinusitis 01/09/2017  . Deviated septum 01/09/2017  . Diaphoresis 01/04/2017  . Chronic left sacroiliac pain 10/19/2016  . Chronic left-sided low back pain with left-sided sciatica 07/31/2016  . Hypokalemia 10/14/2015  . Hypocalcemia 10/14/2015  . Hypothyroidism following radioiodine therapy 03/10/2015  . Screening for prostate cancer 03/10/2015  . Hemochromatosis 03/27/2014  . Choledocholithiasis 07/14/2013  . Nonspecific (abnormal) findings on radiological and other examination of biliary tract 07/13/2013  . Calculus of gallbladder with acute cholecystitis, without mention of obstruction 07/13/2013  . Calculus of bile duct without mention of cholecystitis or obstruction 07/13/2013  . Abdominal pain 07/12/2013  . Neck pain on left side 05/30/2013  . Quit smoking 11/28/2012  . Degenerative disc disease, lumbar 08/06/2012    Class: Chronic  . Type II diabetes mellitus with peripheral autonomic neuropathy ([place]) 06/10/2012  . COPD GOLD 0 02/09/2012  . Chest pain at rest 02/06/2012  . Allergic rhinitis 02/06/2012  . Radiculopathy of leg 11/05/2010  . RENAL CYST 05/27/2010  . LUMBAR DISC DISORDER 05/27/2010  . HYPERTENSION, BENIGN 04/19/2010  . CAD, NATIVE VESSEL 04/19/2010  . HYPERLIPIDEMIA 04/09/2007  . GERD 04/09/2007  . OSTEOARTHRITIS, LUMBAR SPINE 04/09/2007    Past Medical History:  Diagnosis Date  . CAD, NATIVE VESSEL 04/19/2010   nonobstructive by cath 2007:  oLAD 20-30%, mLAD 50%, pCFX 20-30%, oAVCFX 20-30%, L renal art 50%;  normal LVF  . Cataract    bil cataracts removed  . COPD (chronic obstructive pulmonary disease) ([place])   . Diabetes mellitus without complication ([place])   . GERD 04/09/2007  . Headache(784.0)   . HYPERLIPIDEMIA 04/09/2007   Patient denies.  [name] HYPERTENSION, BENIGN 04/19/2010  . Hypothyroidism   . Hypothyroidism    previous hyperthyroidism, s/p I-131  . LUMBAR DISC DISORDER 05/27/2010  . Nerve damage    Left leg  . OSTEOARTHRITIS, LUMBAR SPINE 04/09/2007  . RENAL CYST 05/27/2010  . Spinal headache    After having back surgery in  2014    Family History  Problem Relation Age of Onset  . Breast cancer Mother   . Uterine cancer Mother   . Heart disease Mother   . Heart attack Mother 59  . Prostate cancer Father   . Hypertension Brother   . Non-Hodgkin's lymphoma Brother   . Stomach cancer Paternal Grandmother   . Thyroid disease Neg Hx   . Colon cancer Neg Hx   . Esophageal cancer Neg Hx   . Rectal cancer Neg Hx   . Pancreatic cancer Neg Hx     Past Surgical History:  Procedure Laterality Date  . BACK SURGERY  2012,2014  . CARDIAC CATHETERIZATION  2007   Dr Loanne Drilling  . CHOLECYSTECTOMY    . CHOLECYSTECTOMY N/A 07/13/2013   Procedure: LAPAROSCOPIC CHOLECYSTECTOMY WITH INTRAOPERATIVE CHOLANGIOGRAM;  Surgeon: Shann Medal, MD;  Location: [facility];  Service: General;  Laterality: N/A;  . COLONOSCOPY    . COLONOSCOPY W/ POLYPECTOMY    . ERCP N/A 07/14/2013   Procedure: ENDOSCOPIC RETROGRADE CHOLANGIOPANCREATOGRAPHY (ERCP);  Surgeon: [name], MD;  Location: [facility];  Service: Endoscopy;  Laterality: N/A;  . EYE SURGERY Bilateral    Cataract removal  . FOOT FRACTURE SURGERY    . FRACTURE SURGERY    . LUMBAR DISC SURGERY  08/06/2012   L3 & L4  . LUMBAR DISC SURGERY  11/11/2015   L1 L2    DR    . LUMBAR FUSION N/A 04/20/2015   Procedure: T12 to L1 fusion (Extension of Previous Fusion L2-S1 to T12-S1), Right Transforaminal lumbar interbody fusion, Posterior Fusion T12 to L1, with Pedicle screws, allograft, local bone graft, and  Vivigen;  Surgeon: [name], MD;  Location: [place] [facility] [facility];  Service: Orthopedics;  Laterality: N/A;  . LUMBAR LAMINECTOMY N/A 11/10/2013   Procedure: Left L1-2 far lateral approach to excise herniated nucleus pulposus;  Surgeon: [name], MD;  Location: [place];  Service: Orthopedics;  Laterality: N/A;  . LUMBAR LAMINECTOMY/DECOMPRESSION MICRODISCECTOMY Left 08/10/2012   Procedure: Dura Repair Left Side L2-L3;  Surgeon: [name], MD;  Location: [place];  Service: Orthopedics;  Laterality: Left;  Wilson Frame, Sliding table, dura repair kit, microscope  . NECK SURGERY     X 2  . SPINE SURGERY  2006   C-spine surgery x 2  . UPPER GASTROINTESTINAL ENDOSCOPY     Social History   Occupational History  . 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 Smoking status: Former Smoker    Packs/day: 0.50    Years: 44.00    Pack years: 22.00    Types: Cigarettes    Last attempt to quit: 04/07/2007    Years since quitting: 11.6  . Smokeless tobacco: Never Used  . Tobacco comment: pt has stopped smoking about 9 months now  Substance and Sexual Activity  . Alcohol use: No  . Drug use: No  . Sexual activity: Not Currently

## 2018-11-15 NOTE — Addendum Note (Signed)
Addended by: Basil Dess on: 11/15/2018 10:44 AM   Modules accepted: Orders

## 2018-11-19 DIAGNOSIS — D485 Neoplasm of uncertain behavior of skin: Secondary | ICD-10-CM | POA: Diagnosis not present

## 2018-11-19 DIAGNOSIS — L821 Other seborrheic keratosis: Secondary | ICD-10-CM | POA: Diagnosis not present

## 2018-11-19 DIAGNOSIS — C44519 Basal cell carcinoma of skin of other part of trunk: Secondary | ICD-10-CM | POA: Diagnosis not present

## 2018-11-19 DIAGNOSIS — D1801 Hemangioma of skin and subcutaneous tissue: Secondary | ICD-10-CM | POA: Diagnosis not present

## 2018-11-19 DIAGNOSIS — C44712 Basal cell carcinoma of skin of right lower limb, including hip: Secondary | ICD-10-CM | POA: Diagnosis not present

## 2018-11-19 DIAGNOSIS — L814 Other melanin hyperpigmentation: Secondary | ICD-10-CM | POA: Diagnosis not present

## 2018-11-19 DIAGNOSIS — L219 Seborrheic dermatitis, unspecified: Secondary | ICD-10-CM | POA: Diagnosis not present

## 2018-11-24 ENCOUNTER — Other Ambulatory Visit: Payer: Self-pay | Admitting: Pulmonary Disease

## 2018-11-24 ENCOUNTER — Telehealth: Payer: Self-pay | Admitting: Cardiology

## 2018-11-24 NOTE — Telephone Encounter (Signed)
Attempted to call patient, no answer and no voice mail.  Call is in regards to recent office visit 5/22 and possible Covid exposure and offer free testing.

## 2018-11-25 ENCOUNTER — Ambulatory Visit (INDEPENDENT_AMBULATORY_CARE_PROVIDER_SITE_OTHER): Payer: PPO | Admitting: Pulmonary Disease

## 2018-11-25 ENCOUNTER — Other Ambulatory Visit: Payer: Self-pay

## 2018-11-25 ENCOUNTER — Encounter: Payer: Self-pay | Admitting: Pulmonary Disease

## 2018-11-25 VITALS — BP 124/84 | HR 54 | Temp 98.0°F | Ht 71.0 in | Wt 195.8 lb

## 2018-11-25 DIAGNOSIS — G478 Other sleep disorders: Secondary | ICD-10-CM

## 2018-11-25 DIAGNOSIS — G4733 Obstructive sleep apnea (adult) (pediatric): Secondary | ICD-10-CM | POA: Insufficient documentation

## 2018-11-25 DIAGNOSIS — J324 Chronic pansinusitis: Secondary | ICD-10-CM

## 2018-11-25 MED ORDER — ESZOPICLONE 2 MG PO TABS: 2.0000 mg | ORAL_TABLET | Freq: Every evening | ORAL | 3 refills | Status: DC | PRN

## 2018-11-25 NOTE — Assessment & Plan Note (Signed)
Assessment: This is likely multifactorial as patient has chronic back pain, chronic sinusitis as well as uncontrolled obstructive sleep apnea Patient reports clinical improvement with management of Lunesta he is requesting refill today  Plan: Refilled Lunesta Reviewed [place] PMP aware Can further evaluate Lunesta dosage once patient is compliant with CPAP and has well controlled obstructive sleep apnea

## 2018-11-25 NOTE — Progress Notes (Signed)
@Patient  ID: Jeff Lopez, male    DOB: March 04, 1951, 68 y.o.   MRN: [id number]  Chief Complaint  Patient presents with  . Follow-up    Never used Cpap but told by Ander Slade he needs it then covid happened. Had HST a few months back.     Referring provider: Caren Macadam, MD  HPI:  68 year old male former smoker followed in our office for moderate obstructive sleep apnea  PMH: GERD, type 2 diabetes, hypothyroidism, hyperlipidemia, hypertension chronic sinusitis (followed by Dr. Janace Hoard), allergic rhinitis Smoker/ Smoking History: Former smoker.  Quit 2008.  22-pack-year smoking history. Maintenance:   Pt of: Patient of Dr. Ander Slade  11/25/2018  - Visit   68 year old male former smoker followed in our office for moderate obstructive sleep apnea.  January/23 home sleep study revealed an AHI of 18.3.  Patient was last seen in our office in November/2019 and patient was also started on Lunesta.  Patient reports that the [place] worked well for him.  He also is interested in starting therapy to treat his obstructive sleep apnea.  He is here to discuss those results.    Tests:   07/24/2018- home sleep study- AHI 18.3, SaO2 low 82%  09/20/2015-echocardiogram- LV ejection fraction 55 to 60%, grade 1 diastolic dysfunction, aortic valve mild regurgitation  04/11/2018- mild perfusion Lexiscan Findings:  The left ventricular ejection fraction is mildly decreased (45-54%).  Nuclear stress EF: 52%.  No T wave inversion was noted during stress.  There was no ST segment deviation noted during stress.  This is a low risk study.  02/12/2014-spirometry-FVC 3.7 (76% predicted), ratio 72, FEV1 2.7  Results of the Epworth flowsheet 05/16/2018  Sitting and reading 0  Watching TV 0  Sitting, inactive in a public place (e.g. a theatre or a meeting) 0  As a passenger in a car for an hour without a break 1  Lying down to rest in the afternoon when circumstances permit 3  Sitting and talking  to someone 0  Sitting quietly after a lunch without alcohol 0  In a car, while stopped for a few minutes in traffic 0  Total score 4    FENO:  No results found for: NITRICOXIDE  PFT: No flowsheet data found.  Imaging: No results found.    Specialty Problems      Pulmonary Problems   Allergic rhinitis   COPD GOLD 0    Followed in Pulmonary clinic/ [facility]/ [place]   - spirometry 06/18/2012 wnl      Chronic pansinusitis    Managed by [facility] -Dr. Janace Hoard      Deviated septum   Dyspnea   OSA (obstructive sleep apnea)    07/24/2018- home sleep study- AHI 18.3, SaO2 low 82%          Allergies  Allergen Reactions  . Ceftin Other (See Comments)    Patient stated it caused "sores in mouth" Thrush  . Cefuroxime Axetil Other (See Comments)    Sores in mouth Sores in mouth Patient stated it caused "sores in mouth" Thrush other    Immunization History  Administered Date(s) Administered  . Influenza Split 03/16/2011, 04/12/2012  . Influenza Whole 04/23/2010  . Influenza, High Dose Seasonal PF 02/15/2015  . Influenza,inj,Quad PF,6+ Mos 03/19/2014, 04/04/2018  . Influenza-Unspecified 04/08/2013  . Pneumococcal Conjugate-13 02/12/2014  . Pneumococcal Polysaccharide-23 05/27/2010, 04/29/2018  . Td 01/24/2006  . Tdap 02/28/2018  . Varicella 05/10/2012  . Zoster Recombinat (Shingrix) 03/08/2017    Past  Medical History:  Diagnosis Date  . CAD, NATIVE VESSEL 04/19/2010   nonobstructive by cath 2007:  oLAD 20-30%, mLAD 50%, pCFX 20-30%, oAVCFX 20-30%, L renal art 50%;  normal LVF  . Cataract    bil cataracts removed  . COPD (chronic obstructive pulmonary disease) ([place])   . Diabetes mellitus without complication ([place])   . GERD 04/09/2007  . Headache(784.0)   . HYPERLIPIDEMIA 04/09/2007   Patient denies.  [name] HYPERTENSION, BENIGN 04/19/2010  . Hypothyroidism   . Hypothyroidism    previous hyperthyroidism, s/p I-131  . LUMBAR DISC DISORDER  05/27/2010  . Nerve damage    Left leg  . OSTEOARTHRITIS, LUMBAR SPINE 04/09/2007  . RENAL CYST 05/27/2010  . Spinal headache    After having back surgery in 2014    Tobacco History: Social History   Tobacco Use  Smoking Status Former Smoker  . Packs/day: 0.50  . Years: 44.00  . Pack years: 22.00  . Types: Cigarettes  . Last attempt to quit: 04/07/2007  . Years since quitting: 11.6  Smokeless Tobacco Never Used  Tobacco Comment   pt has stopped smoking about 9 months now   Counseling given: Yes Comment: pt has stopped smoking about 9 months now   Continue to not smoke  Outpatient Encounter Medications as of 11/25/2018  Medication Sig  . cholecalciferol (VITAMIN D3) 25 MCG (1000 UT) tablet Take 1,000 Units by mouth daily.  . eszopiclone (LUNESTA) 2 MG TABS tablet Take 1 tablet (2 mg total) by mouth at bedtime as needed for sleep. Take immediately before bedtime  . irbesartan (AVAPRO) 300 MG tablet TAKE 1 TABLET(300 MG) BY MOUTH DAILY  . levothyroxine (SYNTHROID) 125 MCG tablet TAKE 1 TABLET BY MOUTH EVERY DAY BEFORE BREAKFAST  . metFORMIN (GLUCOPHAGE XR) 500 MG 24 hr tablet Take 1 tablet (500 mg total) by mouth daily with breakfast.  . metoprolol succinate (TOPROL-XL) 50 MG 24 hr tablet TAKE 1 TABLET BY MOUTH DAILY  . naproxen (NAPROSYN) 500 MG tablet Take 1 tablet (500 mg total) by mouth 3 (three) times daily with meals.  . pantoprazole (PROTONIX) 40 MG tablet Take 1 tablet (40 mg total) by mouth 2 (two) times daily.  . [DISCONTINUED] eszopiclone (LUNESTA) 2 MG TABS tablet Take 1 tablet (2 mg total) by mouth at bedtime as needed for sleep. Take immediately before bedtime  . amLODipine (NORVASC) 5 MG tablet Take 1 tablet (5 mg total) by mouth daily.  [name] aspirin-acetaminophen-caffeine (EXCEDRIN MIGRAINE) 250-250-65 MG tablet Take by mouth every 6 (six) hours as needed for headache.  . metaxalone (SKELAXIN) 800 MG tablet Take 1 tablet (800 mg total) by mouth 3 (three) times daily.  (Patient not taking: Reported on 11/25/2018)   No facility-administered encounter medications on file as of 11/25/2018.      Review of Systems  Review of Systems  Constitutional: Negative for activity change, chills, fatigue, fever and unexpected weight change.  HENT: Negative for postnasal drip, rhinorrhea, sneezing and sore throat.   Eyes: Negative.   Respiratory: Negative for cough, shortness of breath and wheezing.   Cardiovascular: Negative for chest pain and palpitations.  Gastrointestinal: Negative for diarrhea, nausea and vomiting.  Endocrine: Negative.   Musculoskeletal: Negative.   Skin: Negative.   Neurological: Negative for dizziness and headaches.  Psychiatric/Behavioral: Negative.  Negative for dysphoric mood. The patient is not nervous/anxious.   All other systems reviewed and are negative.    Physical Exam  BP 124/84 (BP Location: Left Arm, Patient Position: Sitting,  Cuff Size: Normal)   Pulse (!) 54   Temp 98 F (36.7 C)   Ht 5\' 11"  (1.803 m)   Wt 195 lb 12.8 oz (88.8 kg)   SpO2 98%   BMI 27.31 kg/m   Wt Readings from Last 5 Encounters:  11/25/18 195 lb 12.8 oz (88.8 kg)  11/15/18 194 lb (88 kg)  08/07/18 193 lb 12.8 oz (87.9 kg)  05/16/18 205 lb 12.8 oz (93.4 kg)  04/29/18 206 lb 12.8 oz (93.8 kg)    Physical Exam  Constitutional: He is oriented to person, place, and time and well-developed, well-nourished, and in no distress. No distress.  Adult male  HENT:  Head: Normocephalic and atraumatic.  Right Ear: Hearing, tympanic membrane, external ear and ear canal normal.  Left Ear: Hearing, tympanic membrane, external ear and ear canal normal.  Nose: Nose normal.  Mouth/Throat: Uvula is midline and oropharynx is clear and moist. No oropharyngeal exudate.  Mallampati 1  Eyes: Pupils are equal, round, and reactive to light.  Neck: Normal range of motion. Neck supple.  Cardiovascular: Normal rate, regular rhythm and normal heart sounds.   Pulmonary/Chest: Effort normal and breath sounds normal. No accessory muscle usage. No respiratory distress. He has no decreased breath sounds. He has no wheezes. He has no rhonchi.  Abdominal: Soft. Bowel sounds are normal. He exhibits no distension. There is no abdominal tenderness.  Musculoskeletal: Normal range of motion.        General: No edema.  Lymphadenopathy:    He has no cervical adenopathy.  Neurological: He is alert and oriented to person, place, and time. Gait normal.  Skin: Skin is warm and dry. He is not diaphoretic. No erythema.  Psychiatric: Mood, memory, affect and judgment normal.  Nursing note and vitals reviewed.     Lab Results:  CBC    Component Value Date/Time   WBC 6.1 10/30/2018 0916   RBC 4.85 10/30/2018 0916   HGB 15.9 10/30/2018 0916   HGB 13.8 04/30/2014 1118   HCT 46.0 10/30/2018 0916   HCT 42.0 04/30/2014 1118   PLT 183.0 10/30/2018 0916   PLT 215 04/30/2014 1118   MCV 94.8 10/30/2018 0916   MCV 98.1 (H) 04/30/2014 1118   MCH 33.5 04/23/2015 0554   MCHC 34.6 10/30/2018 0916   RDW 11.8 10/30/2018 0916   RDW 13.1 04/30/2014 1118   LYMPHSABS 1.2 10/30/2018 0916   LYMPHSABS 1.3 04/30/2014 1118   MONOABS 0.5 10/30/2018 0916   MONOABS 0.7 04/30/2014 1118   EOSABS 0.2 10/30/2018 0916   EOSABS 0.1 04/30/2014 1118   BASOSABS 0.0 10/30/2018 0916   BASOSABS 0.1 04/30/2014 1118    BMET    Component Value Date/Time   NA 139 10/30/2018 0916   NA 140 07/05/2017 1024   K 4.0 10/30/2018 0916   CL 104 10/30/2018 0916   CO2 23 10/30/2018 0916   GLUCOSE 149 (H) 10/30/2018 0916   BUN 23 10/30/2018 0916   BUN 19 07/05/2017 1024   CREATININE 1.23 10/30/2018 0916   CREATININE 1.18 09/10/2015 1146   CALCIUM 8.8 10/30/2018 0916   GFRNONAA 58 (L) 07/05/2017 1024   GFRAA 67 07/05/2017 1024    BNP No results found for: BNP  ProBNP    Component Value Date/Time   PROBNP 36.0 10/03/2017 1700      Assessment & Plan:   OSA (obstructive sleep  apnea) Assessment: January/2020 home sleep study shows moderate effective sleep apnea, AHI 18.3 Mallampati 1 today Patient actively working on  losing weight he reports he is lost about 20 pounds since his last office visit BMI 27.3  Plan: Start CPAP therapy today Follow-up with our office in 2 months Continue to work on diet and exercise   Chronic pansinusitis Plan: Start CPAP therapy today If patient is intolerant of CPAP due to chronic sinusitis that may need to consider oral appliance, or inspire device  Non-restorative sleep Assessment: This is likely multifactorial as patient has chronic back pain, chronic sinusitis as well as uncontrolled obstructive sleep apnea Patient reports clinical improvement with management of Lunesta he is requesting refill today  Plan: Refilled Lunesta Reviewed [place] PMP aware Can further evaluate Lunesta dosage once patient is compliant with CPAP and has well controlled obstructive sleep apnea   Before prescribing the patient with Lunesta, I have checked [place] PMP aware and the patients overdose risk score is 280. Patient has 4 providers prescribing controlled substances. Patient has used 3 pharmacies. I have counseled the patient on the sedative effects of Lunesta. Patient to use this medication sparingly and not when driving, drinking alcohol, or using additional sedative medications. Patient has been prescribed 30 tablets with 3 refills.    Return in about 2 months (around 01/25/2019), or if symptoms worsen or fail to improve, for Follow up with Dr. Ander Slade, Follow up with Wyn Quaker FNP-C.   Lauraine Rinne, NP 11/25/2018   This appointment was 26 minutes long with over 50% of the time in direct face-to-face patient care, assessment, plan of care, and follow-up.

## 2018-11-25 NOTE — Assessment & Plan Note (Signed)
Assessment: January/2020 home sleep study shows moderate effective sleep apnea, AHI 18.3 Mallampati 1 today Patient actively working on losing weight he reports he is lost about 20 pounds since his last office visit BMI 27.3  Plan: Start CPAP therapy today Follow-up with our office in 2 months Continue to work on diet and exercise

## 2018-11-25 NOTE — Patient Instructions (Addendum)
New CPAP start  DME: [facility] 5-15  New mask of choice  Supplies    We recommend that you continue using your CPAP daily >>>Keep up the hard work using your device >>> Goal should be wearing this for the entire night that you are sleeping, at least 4 to 6 hours  Remember:  . Do not drive or operate heavy machinery if tired or drowsy.  . Please notify the supply company and office if you are unable to use your device regularly due to missing supplies or machine being broken.  . Work on maintaining a healthy weight and following your recommended nutrition plan  . Maintain proper daily exercise and movement  . Maintaining proper use of your device can also help improve management of other chronic illnesses such as: Blood pressure, blood sugars, and weight management.   BiPAP/ CPAP Cleaning:  >>>Clean weekly, with Dawn soap, and bottle brush.  Set up to air dry.   Refilled lunesta  >>>this medication is sedating  >>>use at night as needed    Continue daily exercise and diet for management of medications    Return in about 2 months (around 01/25/2019), or if symptoms worsen or fail to improve, for Follow up with Dr. Ander Slade, Follow up with Wyn Quaker FNP-C.   Coronavirus (COVID-19) Are you at risk?  Are you at risk for the Coronavirus (COVID-19)?  To be considered HIGH RISK for Coronavirus (COVID-19), you have to meet the following criteria:  . Traveled to [place], [place], [place], [place] or [place]; or in the [place] to [place], [place], [place], or [place]; and have fever, cough, and shortness of breath within the last 2 weeks of travel OR . Been in close contact with a person diagnosed with COVID-19 within the last 2 weeks and have fever, cough, and shortness of breath . IF YOU DO NOT MEET THESE CRITERIA, YOU ARE CONSIDERED LOW RISK FOR COVID-19.  What to do if you are HIGH RISK for COVID-19?  [name] If you are having a medical emergency, call 911. . Seek  medical care right away. Before you go to a doctor's office, urgent care or emergency department, call ahead and tell them about your recent travel, contact with someone diagnosed with COVID-19, and your symptoms. You should receive instructions from your physician's office regarding next steps of care.  . When you arrive at healthcare provider, tell the healthcare staff immediately you have returned from visiting [place], [place], [place], [place] or [place]; or traveled in the [place] to [place], [place], [place], or [place]; in the last two weeks or you have been in close contact with a person diagnosed with COVID-19 in the last 2 weeks.   . Tell the health care staff about your symptoms: fever, cough and shortness of breath. . After you have been seen by a medical provider, you will be either: o Tested for (COVID-19) and discharged home on quarantine except to seek medical care if symptoms worsen, and asked to  - Stay home and avoid contact with others until you get your results (4-5 days)  - Avoid travel on public transportation if possible (such as bus, train, or airplane) or o Sent to the Emergency Department by EMS for evaluation, COVID-19 testing, and possible admission depending on your condition and test results.  What to do if you are LOW RISK for COVID-19?  Reduce your risk of any infection by using the same precautions used for avoiding  the common cold or flu:  [name] Wash your hands often with soap and warm water for at least 20 seconds.  If soap and water are not readily available, use an alcohol-based hand sanitizer with at least 60% alcohol.  . If coughing or sneezing, cover your mouth and nose by coughing or sneezing into the elbow areas of your shirt or coat, into a tissue or into your sleeve (not your hands). . Avoid shaking hands with others and consider head nods or verbal greetings only. . Avoid touching your eyes, nose, or mouth with unwashed hands.  . Avoid close  contact with people who are sick. . Avoid places or events with large numbers of people in one location, like concerts or sporting events. . Carefully consider travel plans you have or are making. . If you are planning any travel outside or inside the [place], visit the CDC's Travelers' Health webpage for the latest health notices. . If you have some symptoms but not all symptoms, continue to monitor at home and seek medical attention if your symptoms worsen. . If you are having a medical emergency, call 911.   [place] / e-Visit: [email or web address]         [facility]: [facility]: [phone]                   [facility]: [phone]           It is flu season:   >>> Best ways to protect herself from the flu: Receive the yearly flu vaccine, practice good hand hygiene washing with soap and also using hand sanitizer when available, eat a nutritious meals, get adequate rest, hydrate appropriately   Please contact the office if your symptoms worsen or you have concerns that you are not improving.   Thank you for choosing [facility] for your healthcare, and for allowing [place] to partner with you on your healthcare journey. I am thankful to be able to provide care to you today.   Wyn Quaker FNP-C     Living With Sleep Apnea Sleep apnea is a condition in which breathing pauses or becomes shallow during sleep. Sleep apnea is most commonly caused by a collapsed or blocked airway. People with sleep apnea snore loudly and have times when they gasp and stop breathing for 10 seconds or more during sleep. This happens over and over during the night. This disrupts your sleep and keeps your body from getting the rest that it needs, which can cause tiredness and lack of energy (fatigue) during the day. The breaks in breathing also  interrupt the deep sleep that you need to feel rested. Even if you do not completely wake up from the gaps in breathing, your sleep may not be restful. You may also have a headache in the morning and low energy during the day, and you may feel anxious or depressed. How can sleep apnea affect me? Sleep apnea increases your chances of extreme tiredness during the day (daytime fatigue). It can also increase your risk for health conditions, such as:  Heart attack.  Stroke.  Diabetes.  Heart failure.  Irregular heartbeat.  High blood pressure. If you have daytime fatigue as a result of sleep apnea, you may be more likely to:  Perform poorly at school or work.  Fall asleep while driving.  Have difficulty with attention.  Develop depression or anxiety.  Become severely  overweight (obese).  Have sexual dysfunction. What actions can I take to manage sleep apnea? Sleep apnea treatment   If you were given a device to open your airway while you sleep, use it only as told by your health care provider. You may be given: ? An oral appliance. This is a custom-made mouthpiece that shifts your lower jaw forward. ? A continuous positive airway pressure (CPAP) device. This device blows air through a mask when you breathe out (exhale). ? A nasal expiratory positive airway pressure (EPAP) device. This device has valves that you put into each nostril. ? A bi-level positive airway pressure (BPAP) device. This device blows air through a mask when you breathe in (inhale) and breathe out (exhale).  You may need surgery if other treatments do not work for you. Sleep habits  Go to sleep and wake up at the same time every day. This helps set your internal clock (circadian rhythm) for sleeping. ? If you stay up later than usual, such as on weekends, try to get up in the morning within 2 hours of your normal wake time.  Try to get at least 7-9 hours of sleep each night.  Stop computer, tablet, and  mobile phone use a few hours before bedtime.  Do not take long naps during the day. If you nap, limit it to 30 minutes.  Have a relaxing bedtime routine. Reading or listening to music may relax you and help you sleep.  Use your bedroom only for sleep. ? Keep your television and computer out of your bedroom. ? Keep your bedroom cool, dark, and quiet. ? Use a supportive mattress and pillows.  Follow your health care provider's instructions for other changes to sleep habits. Nutrition  Do not eat heavy meals in the evening.  Do not have caffeine in the later part of the day. The effects of caffeine can last for more than 5 hours.  Follow your health care provider's or dietitian's instructions for any diet changes. Lifestyle      Do not drink alcohol before bedtime. Alcohol can cause you to fall asleep at first, but then it can cause you to wake up in the middle of the night and have trouble getting back to sleep.  Do not use any products that contain nicotine or tobacco, such as cigarettes and e-cigarettes. If you need help quitting, ask your health care provider. Medicines  Take over-the-counter and prescription medicines only as told by your health care provider.  Do not use over-the-counter sleep medicine. You can become dependent on this medicine, and it can make sleep apnea worse.  Do not use medicines, such as sedatives and narcotics, unless told by your health care provider. Activity  Exercise on most days, but avoid exercising in the evening. Exercising near bedtime can interfere with sleeping.  If possible, spend time outside every day. Natural light helps regulate your circadian rhythm. General information  Lose weight if you need to, and maintain a healthy weight.  Keep all follow-up visits as told by your health care provider. This is important.  If you are having surgery, make sure to tell your health care provider that you have sleep apnea. You may need to  bring your device with you. Where to find more information Learn more about sleep apnea and daytime fatigue from:  American Sleep Association: sleepassociation.[place]: sleepfoundation.org  National Heart, Lung, and Blood Institute: https://www.hartman-hill.biz/ Summary  Sleep apnea can cause daytime fatigue and other serious health  conditions.  Both sleep apnea and daytime fatigue can be bad for your health and well-being.  You may need to wear a device while sleeping to help keep your airway open.  If you are having surgery, make sure to tell your health care provider that you have sleep apnea. You may need to bring your device with you.  Making changes to sleep habits, diet, lifestyle, and activity can help you manage sleep apnea. This information is not intended to replace advice given to you by your health care provider. Make sure you discuss any questions you have with your health care provider. Document Released: 09/06/2017 Document Revised: 02/12/2018 Document Reviewed: 09/06/2017 Elsevier Interactive Patient Education  2019 [place].    CPAP and BPAP Information CPAP and BPAP are methods of helping a person breathe with the use of air pressure. CPAP stands for "continuous positive airway pressure." BPAP stands for "bi-level positive airway pressure." In both methods, air is blown through your nose or mouth and into your air passages to help you breathe well. CPAP and BPAP use different amounts of pressure to blow air. With CPAP, the amount of pressure stays the same while you breathe in and out. With BPAP, the amount of pressure is increased when you breathe in (inhale) so that you can take larger breaths. Your health care provider will recommend whether CPAP or BPAP would be more helpful for you. Why are CPAP and BPAP treatments used? CPAP or BPAP can be helpful if you have:  Sleep apnea.  Chronic obstructive pulmonary disease (COPD).  Heart  failure.  Medical conditions that weaken the muscles of the chest including muscular dystrophy, or neurological diseases such as amyotrophic lateral sclerosis (ALS).  Other problems that cause breathing to be weak, abnormal, or difficult. CPAP is most commonly used for obstructive sleep apnea (OSA) to keep the airways from collapsing when the muscles relax during sleep. How is CPAP or BPAP administered? Both CPAP and BPAP are provided by a small machine with a flexible plastic tube that attaches to a plastic mask. You wear the mask. Air is blown through the mask into your nose or mouth. The amount of pressure that is used to blow the air can be adjusted on the machine. Your health care provider will determine the pressure setting that should be used based on your individual needs. When should CPAP or BPAP be used? In most cases, the mask only needs to be worn during sleep. Generally, the mask needs to be worn throughout the night and during any daytime naps. People with certain medical conditions may also need to wear the mask at other times when they are awake. Follow instructions from your health care provider about when to use the machine. What are some tips for using the mask?   Because the mask needs to be snug, some people feel trapped or closed-in (claustrophobic) when first using the mask. If you feel this way, you may need to get used to the mask. One way to do this is by holding the mask loosely over your nose or mouth and then gradually applying the mask more snugly. You can also gradually increase the amount of time that you use the mask.  Masks are available in various types and sizes. Some fit over your mouth and nose while others fit over just your nose. If your mask does not fit well, talk with your health care provider about getting a different one.  If you are using a mask that  fits over your nose and you tend to breathe through your mouth, a chin strap may be applied to help keep  your mouth closed.  The CPAP and BPAP machines have alarms that may sound if the mask comes off or develops a leak.  If you have trouble with the mask, it is very important that you talk with your health care provider about finding a way to make the mask easier to tolerate. Do not stop using the mask. Stopping the use of the mask could have a negative impact on your health. What are some tips for using the machine?  Place your CPAP or BPAP machine on a secure table or stand near an electrical outlet.  Know where the on/off switch is located on the machine.  Follow instructions from your health care provider about how to set the pressure on your machine and when you should use it.  Do not eat or drink while the CPAP or BPAP machine is on. Food or fluids could get pushed into your lungs by the pressure of the CPAP or BPAP.  Do not smoke. Tobacco smoke residue can damage the machine.  For home use, CPAP and BPAP machines can be rented or purchased through home health care companies. Many different brands of machines are available. Renting a machine before purchasing may help you find out which particular machine works well for you.  Keep the CPAP or BPAP machine and attachments clean. Ask your health care provider for specific instructions. Get help right away if:  You have redness or open areas around your nose or mouth where the mask fits.  You have trouble using the CPAP or BPAP machine.  You cannot tolerate wearing the CPAP or BPAP mask.  You have pain, discomfort, and bloating in your abdomen. Summary  CPAP and BPAP are methods of helping a person breathe with the use of air pressure.  Both CPAP and BPAP are provided by a small machine with a flexible plastic tube that attaches to a plastic mask.  If you have trouble with the mask, it is very important that you talk with your health care provider about finding a way to make the mask easier to tolerate. This information is not  intended to replace advice given to you by your health care provider. Make sure you discuss any questions you have with your health care provider. Document Released: 03/10/2004 Document Revised: 02/12/2018 Document Reviewed: 05/01/2016 Elsevier Interactive Patient Education  2019 [employer].

## 2018-11-25 NOTE — Assessment & Plan Note (Signed)
Plan: Start CPAP therapy today If patient is intolerant of CPAP due to chronic sinusitis that may need to consider oral appliance, or inspire device

## 2018-11-26 ENCOUNTER — Other Ambulatory Visit: Payer: Self-pay | Admitting: Endocrinology

## 2018-11-26 NOTE — Telephone Encounter (Signed)
Please review refill request and refill if deemed appropriate.

## 2018-12-05 ENCOUNTER — Telehealth: Payer: Self-pay | Admitting: Pulmonary Disease

## 2018-12-05 NOTE — Telephone Encounter (Signed)
Patient stopped by the office today to check on the status of his CPAP machine that ordered on 6/1. Patient stated that he was advised to follow up with this office by Aaron Edelman last week if he hadn't heard anything within a week. He received a letter from [employer] 2 days ago stating that the equipment had been approved.   Reviewed patient's chart. Order was sent to [facility] on 6/1. Called [facility] and spoke with [place]. He stated that they have the order but are just waiting on the approval from the insurance. As soon as they have that, they will finish processing the order.   Asked patient if he could bring the letter to the office so we could get a copy of it. He stated yes and that he would be back tomorrow with the letter. Also gave him the number to [facility] for him to follow up as well.   Will keep this encounter open for follow up.

## 2018-12-13 DIAGNOSIS — G4733 Obstructive sleep apnea (adult) (pediatric): Secondary | ICD-10-CM | POA: Diagnosis not present

## 2019-01-08 DIAGNOSIS — J329 Chronic sinusitis, unspecified: Secondary | ICD-10-CM | POA: Diagnosis not present

## 2019-01-11 ENCOUNTER — Other Ambulatory Visit: Payer: Self-pay | Admitting: Otolaryngology

## 2019-01-11 DIAGNOSIS — J323 Chronic sphenoidal sinusitis: Secondary | ICD-10-CM

## 2019-01-11 DIAGNOSIS — J342 Deviated nasal septum: Secondary | ICD-10-CM

## 2019-01-11 DIAGNOSIS — J320 Chronic maxillary sinusitis: Secondary | ICD-10-CM

## 2019-01-12 DIAGNOSIS — G4733 Obstructive sleep apnea (adult) (pediatric): Secondary | ICD-10-CM | POA: Diagnosis not present

## 2019-01-21 ENCOUNTER — Ambulatory Visit
Admission: RE | Admit: 2019-01-21 | Discharge: 2019-01-21 | Disposition: A | Discharge status: Home / Self Care | Payer: PPO | Source: Ambulatory Visit | Attending: Otolaryngology | Admitting: Otolaryngology

## 2019-01-21 DIAGNOSIS — R062 Wheezing: Secondary | ICD-10-CM | POA: Diagnosis not present

## 2019-01-21 DIAGNOSIS — R040 Epistaxis: Secondary | ICD-10-CM | POA: Diagnosis not present

## 2019-01-21 DIAGNOSIS — J320 Chronic maxillary sinusitis: Secondary | ICD-10-CM

## 2019-01-21 DIAGNOSIS — J323 Chronic sphenoidal sinusitis: Secondary | ICD-10-CM

## 2019-01-21 DIAGNOSIS — J342 Deviated nasal septum: Secondary | ICD-10-CM

## 2019-01-24 ENCOUNTER — Telehealth: Payer: Self-pay | Admitting: Pulmonary Disease

## 2019-01-24 DIAGNOSIS — J310 Chronic rhinitis: Secondary | ICD-10-CM | POA: Diagnosis not present

## 2019-01-24 NOTE — Telephone Encounter (Signed)
Attempt to call x2 for appt reminder and covid screening - no answer and no answering machine.

## 2019-01-24 NOTE — Telephone Encounter (Signed)
01/24/2019 0914  We received notification from [facility] that patient has returned CPAP to them.  He states he cannot tolerate it.  Patient was offered a different mask by the DME company but patient declined.  Chart review shows that patient has not contacted [place] regarding this.  Triage, Can you please contact the patient and let them know that we have received this notice.  You can offer the patient a office visit with Dr. Ander Slade or APP to discuss other treatment options for obstructive sleep apnea if he is interested.  We will route to Dr. Ander Slade as Juluis Rainier.  Wyn Quaker FNP

## 2019-01-24 NOTE — Telephone Encounter (Signed)
Attempted to call pt but line rang and rang and then went to a busy tone. Unable to leave a message. Will try to call back later.

## 2019-01-24 NOTE — Progress Notes (Addendum)
@Patient  ID: Jeff Lopez, male    DOB: 05/06/1951, 68 y.o.   MRN: [id number]  Chief Complaint  Patient presents with   Follow-up    OSA follow-up, did not tolerate CPAP    Referring provider: Caren Macadam, MD  HPI:  68 year old male former smoker followed in our office for moderate obstructive sleep apnea  PMH: GERD, type 2 diabetes, hypothyroidism, hyperlipidemia, hypertension chronic sinusitis (followed by Dr. Janace Hoard), allergic rhinitis Smoker/ Smoking History: Former smoker.  Quit 2008.  22-pack-year smoking history. Maintenance:  None  Pt of: Patient of Dr. [name]   01/27/2019  - Visit   67 year old male former smoker followed in our office for moderate obstructive sleep apnea.  January/2020 home sleep study shows an AHI of 18.3.  Patient was started on CPAP therapy which patient tried to use for about 4 to 6 weeks.  He reported that he tried to use this every day and was struggling with use.  He was intolerant of the pressures as well as the mask fitting.  He returned the CPAP prior to contacting our office to the [facility].  He is not interested in restarting CPAP therapy.   Patient reporting today lots of confusion regarding CPAP use.  Patient says that some of the times he was using CPAP during the day while he was wide-awake.  He did not think that he needed CPAP therapy as he had no events at that time.  He reports that this is how the DME company told him he could utilize CPAP.  Patient would like to discuss treatment options for obstructive sleep apnea that are alternatives to CPAP therapy.  Patient also reports he recently had a CT sinus scan done by his ENT Dr. Lucia Gaskins.  He is started using saline washes, Nasacort or Flonase, and a daily allergy pill to help with sinus flares.  Tests:   07/24/2018- home sleep study- AHI 18.3, SaO2 low 82%  09/20/2015-echocardiogram- LV ejection fraction 55 to 61%, grade 1 diastolic dysfunction, aortic valve mild  regurgitation  04/11/2018- mild perfusion Lexiscan Findings:  The left ventricular ejection fraction is mildly decreased (45-54%).  Nuclear stress EF: 52%.  No T wave inversion was noted during stress.  There was no ST segment deviation noted during stress.  This is a low risk study.  02/12/2014-spirometry-FVC 3.7 (76% predicted), ratio 72, FEV1 2.7  Results of the Epworth flowsheet 01/27/2019 05/16/2018  Sitting and reading 3 0  Watching TV 0 0  Sitting, inactive in a public place (e.g. a theatre or a meeting) 0 0  As a passenger in a car for an hour without a break 3 1  Lying down to rest in the afternoon when circumstances permit 3 3  Sitting and talking to someone 0 0  Sitting quietly after a lunch without alcohol 0 0  In a car, while stopped for a few minutes in traffic 0 0  Total score 9 4     FENO:  No results found for: NITRICOXIDE  PFT: No flowsheet data found.  Imaging: Ct Maxillofacial Wo Contrast  Result Date: 01/22/2019 CLINICAL DATA:  Chronic nasal drainage and nose bleeds. Sneezing an allergies. Surgery several years ago at [facility]. Fusion procedure requested. EXAM: CT MAXILLOFACIAL WITHOUT CONTRAST TECHNIQUE: Multidetector CT images of the paranasal sinuses were obtained using the standard protocol without intravenous contrast. COMPARISON:  01/09/2017 FINDINGS: Paranasal sinuses: Frontal: Normally aerated. Patent frontal sinus drainage pathways. Ethmoid: Normally aerated. Previous partial ethmoidectomy on the  right. Maxillary: Clear except for a small focus of mucosal thickening or retention cyst at the floor of the right maxillary sinus. Sphenoid: Clear except for a small retention cyst or polyp in the right side. Right ostiomeatal unit: Infundibulum is narrow but patent. Left ostiomeatal unit: Infundibulum is narrow but patent. Adjacent Haller cell. Nasal passages: Diminutive middle turbinate on the right. This may relate to previous surgery or could be  congenital. Nasal septum bows 3 mm towards the right. Anatomy: No pneumatization superior to anterior ethmoid notches. Asymmetrical factory grooves, the left being broader and deeper than the right. Sellar sphenoid pneumatization pattern. No dehiscence of carotid or optic canals. No onodi cell. Other: None IMPRESSION: Previous partial ethmoidectomy on the right. No pronounced sinus disease presently. Retention cyst or polyp in the right side of the sphenoid sinus. 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 Electronically Signed   By: Nelson Chimes M.D.   On: 01/22/2019 15:09      Specialty Problems      Pulmonary Problems   Allergic rhinitis   COPD GOLD 0    Followed in Pulmonary clinic/ [facility]/ [place]   - spirometry 06/18/2012 wnl      Chronic pansinusitis   Deviated septum   Dyspnea   OSA (obstructive sleep apnea)    07/24/2018- home sleep study- AHI 18.3, SaO2 low 82%          Allergies  Allergen Reactions   Ceftin Other (See Comments)    Patient stated it caused "sores in mouth" Thrush   Cefuroxime Axetil Other (See Comments)    Sores in mouth Sores in mouth Patient stated it caused "sores in mouth" Thrush other    Immunization History  Administered Date(s) Administered   Influenza Split 03/16/2011, 04/12/2012   Influenza Whole 04/23/2010   Influenza, High Dose Seasonal PF 02/15/2015   Influenza,inj,Quad PF,6+ Mos 03/19/2014, 04/04/2018   Influenza-Unspecified 04/08/2013   Pneumococcal Conjugate-13 02/12/2014   Pneumococcal Polysaccharide-23 05/27/2010, 04/29/2018   Td 01/24/2006   Tdap 02/28/2018   Varicella 05/10/2012   Zoster Recombinat (Shingrix) 03/08/2017    Past Medical History:  Diagnosis Date   CAD, NATIVE VESSEL 04/19/2010   nonobstructive by cath 2007:  oLAD 20-30%, mLAD 50%, pCFX 20-30%, oAVCFX 20-30%, L renal art 50%;  normal LVF   Cataract    bil cataracts removed   COPD (chronic  obstructive pulmonary disease) ([place])    Diabetes mellitus without complication ([place])    GERD 04/09/2007   Headache(784.0)    HYPERLIPIDEMIA 04/09/2007   Patient denies.   HYPERTENSION, BENIGN 04/19/2010   Hypothyroidism    Hypothyroidism    previous hyperthyroidism, s/p I-131   LUMBAR DISC DISORDER 05/27/2010   Nerve damage    Left leg   OSTEOARTHRITIS, LUMBAR SPINE 04/09/2007   RENAL CYST 05/27/2010   Spinal headache    After having back surgery in 2014    Tobacco History: Social History   Tobacco Use  Smoking Status Former Smoker   Packs/day: 0.50   Years: 44.00   Pack years: 22.00   Types: Cigarettes   Quit date: 04/07/2007   Years since quitting: 11.8  Smokeless Tobacco Never Used  Tobacco Comment   pt has stopped smoking about 9 months now   Counseling given: Yes Comment: pt has stopped smoking about 9 months now  Continue to not smoke  Outpatient Encounter Medications as of 01/27/2019  Medication Sig   aspirin-acetaminophen-caffeine (EXCEDRIN MIGRAINE) 250-250-65 MG tablet Take by mouth  every 6 (six) hours as needed for headache.   cholecalciferol (VITAMIN D3) 25 MCG (1000 UT) tablet Take 1,000 Units by mouth daily.   eszopiclone (LUNESTA) 2 MG TABS tablet TAKE 1 TABLET BY MOUTH IMMEDIATLEY BEFORE BEDTIME AS NEEDED FOR SLEEP   eszopiclone (LUNESTA) 2 MG TABS tablet Take 1 tablet (2 mg total) by mouth at bedtime as needed for sleep. Take immediately before bedtime   irbesartan (AVAPRO) 300 MG tablet TAKE 1 TABLET(300 MG) BY MOUTH DAILY   levothyroxine (SYNTHROID) 125 MCG tablet TAKE 1 TABLET BY MOUTH EVERY DAY BEFORE BREAKFAST   metFORMIN (GLUCOPHAGE XR) 500 MG 24 hr tablet Take 1 tablet (500 mg total) by mouth daily with breakfast.   metoprolol succinate (TOPROL-XL) 50 MG 24 hr tablet TAKE 1 TABLET BY MOUTH DAILY   pantoprazole (PROTONIX) 40 MG tablet Take 1 tablet (40 mg total) by mouth 2 (two) times daily.   ADVAIR HFA 115-21 MCG/ACT  inhaler INHALE 2 PUFFS into lungs 2 TIMES DAILY (Patient not taking: Reported on 01/27/2019)   amLODipine (NORVASC) 5 MG tablet Take 1 tablet (5 mg total) by mouth daily.   metaxalone (SKELAXIN) 800 MG tablet Take 1 tablet (800 mg total) by mouth 3 (three) times daily. (Patient not taking: Reported on 11/25/2018)   naproxen (NAPROSYN) 500 MG tablet Take 1 tablet (500 mg total) by mouth 3 (three) times daily with meals.   No facility-administered encounter medications on file as of 01/27/2019.      Review of Systems  Review of Systems  Constitutional: Negative for activity change, chills, fatigue, fever and unexpected weight change.  HENT: Positive for congestion and sinus pressure. Negative for postnasal drip, rhinorrhea, sinus pain and sore throat.   Eyes: Negative.   Respiratory: Negative for cough, shortness of breath and wheezing.   Cardiovascular: Negative for chest pain and palpitations.  Gastrointestinal: Negative for constipation, diarrhea, nausea and vomiting.  Endocrine: Negative.   Genitourinary: Negative.   Musculoskeletal: Positive for back pain.       Multiple back surgeries  Skin: Negative.   Neurological: Negative for dizziness and headaches.  Psychiatric/Behavioral: Negative for dysphoric mood. The patient is nervous/anxious.   All other systems reviewed and are negative.    Physical Exam  BP 110/82 (BP Location: Left Arm, Cuff Size: Normal)    Pulse (!) 56    Temp 98.2 F (36.8 C) (Oral)    Ht 5\' 11"  (1.803 m)    Wt 195 lb 9.6 oz (88.7 kg)    SpO2 97%    BMI 27.28 kg/m   Wt Readings from Last 5 Encounters:  01/27/19 195 lb 9.6 oz (88.7 kg)  11/25/18 195 lb 12.8 oz (88.8 kg)  11/15/18 194 lb (88 kg)  08/07/18 193 lb 12.8 oz (87.9 kg)  05/16/18 205 lb 12.8 oz (93.4 kg)    Physical Exam Vitals signs and nursing note reviewed.  Constitutional:      General: He is not in acute distress.    Appearance: Normal appearance. He is obese.  HENT:     Head:  Normocephalic and atraumatic.     Right Ear: Hearing, tympanic membrane, ear canal and external ear normal.     Left Ear: Hearing, tympanic membrane, ear canal and external ear normal.     Nose: Rhinorrhea present. No mucosal edema or congestion.     Right Turbinates: Not enlarged.     Left Turbinates: Not enlarged.     Mouth/Throat:     Mouth: Mucous membranes  are dry.     Pharynx: Oropharynx is clear. No oropharyngeal exudate.     Comments: Mallampati 1 Eyes:     Pupils: Pupils are equal, round, and reactive to light.  Neck:     Musculoskeletal: Normal range of motion.  Cardiovascular:     Rate and Rhythm: Normal rate and regular rhythm.     Pulses: Normal pulses.     Heart sounds: Normal heart sounds. No murmur.  Pulmonary:     Effort: Pulmonary effort is normal.     Breath sounds: Normal breath sounds. No decreased breath sounds, wheezing or rales.  Abdominal:     Comments: Truncal obesity  Musculoskeletal:     Right lower leg: No edema.     Left lower leg: No edema.  Lymphadenopathy:     Cervical: No cervical adenopathy.  Skin:    General: Skin is warm and dry.     Capillary Refill: Capillary refill takes less than 2 seconds.     Findings: No erythema or rash.  Neurological:     General: No focal deficit present.     Mental Status: He is alert and oriented to person, place, and time.     Motor: No weakness.     Coordination: Coordination normal.     Gait: Gait is intact. Gait normal.  Psychiatric:        Mood and Affect: Mood is anxious.        Speech: Speech normal.        Behavior: Behavior normal. Behavior is cooperative.        Thought Content: Thought content normal.        Cognition and Memory: Cognition normal.        Judgment: Judgment normal.      Lab Results:  CBC    Component Value Date/Time   WBC 6.1 10/30/2018 0916   RBC 4.85 10/30/2018 0916   HGB 15.9 10/30/2018 0916   HGB 13.8 04/30/2014 1118   HCT 46.0 10/30/2018 0916   HCT 42.0  04/30/2014 1118   PLT 183.0 10/30/2018 0916   PLT 215 04/30/2014 1118   MCV 94.8 10/30/2018 0916   MCV 98.1 (H) 04/30/2014 1118   MCH 33.5 04/23/2015 0554   MCHC 34.6 10/30/2018 0916   RDW 11.8 10/30/2018 0916   RDW 13.1 04/30/2014 1118   LYMPHSABS 1.2 10/30/2018 0916   LYMPHSABS 1.3 04/30/2014 1118   MONOABS 0.5 10/30/2018 0916   MONOABS 0.7 04/30/2014 1118   EOSABS 0.2 10/30/2018 0916   EOSABS 0.1 04/30/2014 1118   BASOSABS 0.0 10/30/2018 0916   BASOSABS 0.1 04/30/2014 1118    BMET    Component Value Date/Time   NA 139 10/30/2018 0916   NA 140 07/05/2017 1024   K 4.0 10/30/2018 0916   CL 104 10/30/2018 0916   CO2 23 10/30/2018 0916   GLUCOSE 149 (H) 10/30/2018 0916   BUN 23 10/30/2018 0916   BUN 19 07/05/2017 1024   CREATININE 1.23 10/30/2018 0916   CREATININE 1.18 09/10/2015 1146   CALCIUM 8.8 10/30/2018 0916   GFRNONAA 58 (L) 07/05/2017 1024   GFRAA 67 07/05/2017 1024    BNP No results found for: BNP  ProBNP    Component Value Date/Time   PROBNP 36.0 10/03/2017 1700      Assessment & Plan:   OSA (obstructive sleep apnea) Patient intolerant to CPAP, return to DME company Patient is interested in alternative therapies to treat obstructive sleep apnea  Plan: Referral to Dr. Corky Sing  office for an oral appliance to manage obstructive sleep apnea Follow-up with our office in 3 months Could also consider inspire device referral to Dr. Redmond Baseman in the future if patient is intolerant of oral appliance  Chronic pansinusitis Plan: Continue follow-up with Dr. Lucia Gaskins    Return in about 3 months (around 04/29/2019), or if symptoms worsen or fail to improve, for Follow up with Dr. [name].   Lauraine Rinne, NP 01/27/2019   This appointment was 28 minutes long with over 50% of the time in direct face-to-face patient care, assessment, plan of care, and follow-up.

## 2019-01-27 ENCOUNTER — Encounter: Payer: Self-pay | Admitting: Pulmonary Disease

## 2019-01-27 ENCOUNTER — Ambulatory Visit (INDEPENDENT_AMBULATORY_CARE_PROVIDER_SITE_OTHER): Payer: PPO | Admitting: Pulmonary Disease

## 2019-01-27 ENCOUNTER — Other Ambulatory Visit: Payer: Self-pay

## 2019-01-27 VITALS — BP 110/82 | HR 56 | Temp 98.2°F | Ht 71.0 in | Wt 195.6 lb

## 2019-01-27 DIAGNOSIS — J324 Chronic pansinusitis: Secondary | ICD-10-CM

## 2019-01-27 DIAGNOSIS — G4733 Obstructive sleep apnea (adult) (pediatric): Secondary | ICD-10-CM | POA: Diagnosis not present

## 2019-01-27 NOTE — Assessment & Plan Note (Signed)
Plan: Continue follow-up with Dr. Lucia Gaskins

## 2019-01-27 NOTE — Patient Instructions (Addendum)
Continue follow up with ENT   We will refer you to Dr. Augustina Mood for your oral appliance  [address] [place]. [place], [place] [address] Phone: [phone] Website: [email or web address]  You will need to have six-month follow-up with Dr. Toy Cookey or your dentist to ensure your proper dentition and no issues with oral appliance.   We will also need to have you follow-up in 6 months with our office with a home sleep study to ensure that your sleep apnea is being treated adequately.   Return in about 3 months (around 04/29/2019), or if symptoms worsen or fail to improve, for Follow up with Dr. Ander Slade.   Coronavirus (COVID-19) Are you at risk?  Are you at risk for the Coronavirus (COVID-19)?  To be considered HIGH RISK for Coronavirus (COVID-19), you have to meet the following criteria:  . Traveled to [place], [place], [place], [place] or [place]; or in the [place] to [place], [place], [place], or [place]; and have fever, cough, and shortness of breath within the last 2 weeks of travel OR . Been in close contact with a person diagnosed with COVID-19 within the last 2 weeks and have fever, cough, and shortness of breath . IF YOU DO NOT MEET THESE CRITERIA, YOU ARE CONSIDERED LOW RISK FOR COVID-19.  What to do if you are HIGH RISK for COVID-19?  [name] If you are having a medical emergency, call 911. . Seek medical care right away. Before you go to a doctor's office, urgent care or emergency department, call ahead and tell them about your recent travel, contact with someone diagnosed with COVID-19, and your symptoms. You should receive instructions from your physician's office regarding next steps of care.  . When you arrive at healthcare provider, tell the healthcare staff immediately you have returned from visiting [place], [place], [place], [place] or [place]; or traveled in the [place] to [place], [place], [place], or [place]; in the last two weeks or you  have been in close contact with a person diagnosed with COVID-19 in the last 2 weeks.   . Tell the health care staff about your symptoms: fever, cough and shortness of breath. . After you have been seen by a medical provider, you will be either: o Tested for (COVID-19) and discharged home on quarantine except to seek medical care if symptoms worsen, and asked to  - Stay home and avoid contact with others until you get your results (4-5 days)  - Avoid travel on public transportation if possible (such as bus, train, or airplane) or o Sent to the Emergency Department by EMS for evaluation, COVID-19 testing, and possible admission depending on your condition and test results.  What to do if you are LOW RISK for COVID-19?  Reduce your risk of any infection by using the same precautions used for avoiding the common cold or flu:  [name] Wash your hands often with soap and warm water for at least 20 seconds.  If soap and water are not readily available, use an alcohol-based hand sanitizer with at least 60% alcohol.  . If coughing or sneezing, cover your mouth and nose by coughing or sneezing into the elbow areas of your shirt or coat, into a tissue or into your sleeve (not your hands). . Avoid shaking hands with others and consider head nods or verbal greetings only. . Avoid touching your eyes, nose, or mouth with unwashed hands.  . Avoid close contact with people who are sick. [name]  Avoid places or events with large numbers of people in one location, like concerts or sporting events. . Carefully consider travel plans you have or are making. . If you are planning any travel outside or inside the [place], visit the CDC's Travelers' Health webpage for the latest health notices. . If you have some symptoms but not all symptoms, continue to monitor at home and seek medical attention if your symptoms worsen. . If you are having a medical emergency, call 911.   [place] / e-Visit: [email or web address]         [facility]: [facility]: [phone]                   [facility]: [phone]           It is flu season:   >>> Best ways to protect herself from the flu: Receive the yearly flu vaccine, practice good hand hygiene washing with soap and also using hand sanitizer when available, eat a nutritious meals, get adequate rest, hydrate appropriately   Please contact the office if your symptoms worsen or you have concerns that you are not improving.   Thank you for choosing [facility] for your healthcare, and for allowing [place] to partner with you on your healthcare journey. I am thankful to be able to provide care to you today.   Wyn Quaker FNP-C     Living With Sleep Apnea Sleep apnea is a condition in which breathing pauses or becomes shallow during sleep. Sleep apnea is most commonly caused by a collapsed or blocked airway. People with sleep apnea snore loudly and have times when they gasp and stop breathing for 10 seconds or more during sleep. This happens over and over during the night. This disrupts your sleep and keeps your body from getting the rest that it needs, which can cause tiredness and lack of energy (fatigue) during the day. The breaks in breathing also interrupt the deep sleep that you need to feel rested. Even if you do not completely wake up from the gaps in breathing, your sleep may not be restful. You may also have a headache in the morning and low energy during the day, and you may feel anxious or depressed. How can sleep apnea affect me? Sleep apnea increases your chances of extreme tiredness during the day (daytime fatigue). It can also increase your risk for health conditions, such as:  Heart attack.  Stroke.  Diabetes.  Heart failure.  Irregular heartbeat.  High blood pressure. If you have daytime  fatigue as a result of sleep apnea, you may be more likely to:  Perform poorly at school or work.  Fall asleep while driving.  Have difficulty with attention.  Develop depression or anxiety.  Become severely overweight (obese).  Have sexual dysfunction. What actions can I take to manage sleep apnea? Sleep apnea treatment   If you were given a device to open your airway while you sleep, use it only as told by your health care provider. You may be given: ? An oral appliance. This is a custom-made mouthpiece that shifts your lower jaw forward. ? A continuous positive airway pressure (CPAP) device. This device blows air through a mask when you breathe out (exhale). ? A nasal expiratory positive airway pressure (EPAP) device. This device has valves that you put into each nostril. ? A bi-level positive airway pressure (BPAP) device. This  device blows air through a mask when you breathe in (inhale) and breathe out (exhale).  You may need surgery if other treatments do not work for you. Sleep habits  Go to sleep and wake up at the same time every day. This helps set your internal clock (circadian rhythm) for sleeping. ? If you stay up later than usual, such as on weekends, try to get up in the morning within 2 hours of your normal wake time.  Try to get at least 7-9 hours of sleep each night.  Stop computer, tablet, and mobile phone use a few hours before bedtime.  Do not take long naps during the day. If you nap, limit it to 30 minutes.  Have a relaxing bedtime routine. Reading or listening to music may relax you and help you sleep.  Use your bedroom only for sleep. ? Keep your television and computer out of your bedroom. ? Keep your bedroom cool, dark, and quiet. ? Use a supportive mattress and pillows.  Follow your health care provider's instructions for other changes to sleep habits. Nutrition  Do not eat heavy meals in the evening.  Do not have caffeine in the later part  of the day. The effects of caffeine can last for more than 5 hours.  Follow your health care provider's or dietitian's instructions for any diet changes. Lifestyle      Do not drink alcohol before bedtime. Alcohol can cause you to fall asleep at first, but then it can cause you to wake up in the middle of the night and have trouble getting back to sleep.  Do not use any products that contain nicotine or tobacco, such as cigarettes and e-cigarettes. If you need help quitting, ask your health care provider. Medicines  Take over-the-counter and prescription medicines only as told by your health care provider.  Do not use over-the-counter sleep medicine. You can become dependent on this medicine, and it can make sleep apnea worse.  Do not use medicines, such as sedatives and narcotics, unless told by your health care provider. Activity  Exercise on most days, but avoid exercising in the evening. Exercising near bedtime can interfere with sleeping.  If possible, spend time outside every day. Natural light helps regulate your circadian rhythm. General information  Lose weight if you need to, and maintain a healthy weight.  Keep all follow-up visits as told by your health care provider. This is important.  If you are having surgery, make sure to tell your health care provider that you have sleep apnea. You may need to bring your device with you. Where to find more information Learn more about sleep apnea and daytime fatigue from:  American Sleep Association: sleepassociation.[place]: sleepfoundation.org  National Heart, Lung, and Blood Institute: https://www.hartman-hill.biz/ Summary  Sleep apnea can cause daytime fatigue and other serious health conditions.  Both sleep apnea and daytime fatigue can be bad for your health and well-being.  You may need to wear a device while sleeping to help keep your airway open.  If you are having surgery, make sure to tell your health  care provider that you have sleep apnea. You may need to bring your device with you.  Making changes to sleep habits, diet, lifestyle, and activity can help you manage sleep apnea. This information is not intended to replace advice given to you by your health care provider. Make sure you discuss any questions you have with your health care provider. Document Released: 09/06/2017 Document Revised:  10/04/2018 Document Reviewed: 09/06/2017 Elsevier Patient Education  [place].

## 2019-01-27 NOTE — Telephone Encounter (Signed)
Pt had OV with Wyn Quaker, NP, today 01/27/19. Concerns were addressed at that time. Will sign off on message.

## 2019-01-27 NOTE — Assessment & Plan Note (Signed)
Patient intolerant to CPAP, return to DME company Patient is interested in alternative therapies to treat obstructive sleep apnea  Plan: Referral to Dr. Corky Sing office for an oral appliance to manage obstructive sleep apnea Follow-up with our office in 3 months Could also consider inspire device referral to Dr. Redmond Baseman in the future if patient is intolerant of oral appliance

## 2019-02-03 ENCOUNTER — Other Ambulatory Visit: Payer: Self-pay | Admitting: Cardiovascular Disease

## 2019-02-12 ENCOUNTER — Other Ambulatory Visit: Payer: Self-pay

## 2019-02-12 ENCOUNTER — Telehealth: Payer: Self-pay | Admitting: Family Medicine

## 2019-02-12 ENCOUNTER — Other Ambulatory Visit (INDEPENDENT_AMBULATORY_CARE_PROVIDER_SITE_OTHER): Payer: PPO

## 2019-02-12 DIAGNOSIS — E1169 Type 2 diabetes mellitus with other specified complication: Secondary | ICD-10-CM | POA: Diagnosis not present

## 2019-02-12 DIAGNOSIS — E785 Hyperlipidemia, unspecified: Secondary | ICD-10-CM

## 2019-02-12 DIAGNOSIS — E1143 Type 2 diabetes mellitus with diabetic autonomic (poly)neuropathy: Secondary | ICD-10-CM | POA: Diagnosis not present

## 2019-02-12 DIAGNOSIS — E890 Postprocedural hypothyroidism: Secondary | ICD-10-CM | POA: Diagnosis not present

## 2019-02-12 DIAGNOSIS — E538 Deficiency of other specified B group vitamins: Secondary | ICD-10-CM

## 2019-02-12 LAB — CBC WITH DIFFERENTIAL/PLATELET
Basophils Absolute: 0 10*3/uL (ref 0.0–0.1)
Basophils Relative: 0.8 % (ref 0.0–3.0)
Eosinophils Absolute: 0.2 10*3/uL (ref 0.0–0.7)
Eosinophils Relative: 2.7 % (ref 0.0–5.0)
HCT: 47 % (ref 39.0–52.0)
Hemoglobin: 15.9 g/dL (ref 13.0–17.0)
Lymphocytes Relative: 22.1 % (ref 12.0–46.0)
Lymphs Abs: 1.3 10*3/uL (ref 0.7–4.0)
MCHC: 33.9 g/dL (ref 30.0–36.0)
MCV: 97.8 fl (ref 78.0–100.0)
Monocytes Absolute: 0.6 10*3/uL (ref 0.1–1.0)
Monocytes Relative: 9.6 % (ref 3.0–12.0)
Neutro Abs: 3.9 10*3/uL (ref 1.4–7.7)
Neutrophils Relative %: 64.8 % (ref 43.0–77.0)
Platelets: 189 10*3/uL (ref 150.0–400.0)
RBC: 4.81 Mil/uL (ref 4.22–5.81)
RDW: 12.5 % (ref 11.5–15.5)
WBC: 6 10*3/uL (ref 4.0–10.5)

## 2019-02-12 LAB — COMPREHENSIVE METABOLIC PANEL
ALT: 25 U/L (ref 0–53)
AST: 19 U/L (ref 0–37)
Albumin: 4.7 g/dL (ref 3.5–5.2)
Alkaline Phosphatase: 75 U/L (ref 39–117)
BUN: 21 mg/dL (ref 6–23)
CO2: 25 mEq/L (ref 19–32)
Calcium: 9.4 mg/dL (ref 8.4–10.5)
Chloride: 103 mEq/L (ref 96–112)
Creatinine, Ser: 1.32 mg/dL (ref 0.40–1.50)
GFR: 54.12 mL/min — ABNORMAL LOW (ref >60.00)
Glucose, Bld: 176 mg/dL — ABNORMAL HIGH (ref 70–99)
Potassium: 4.1 mEq/L (ref 3.5–5.1)
Sodium: 139 mEq/L (ref 135–145)
Total Bilirubin: 1.6 mg/dL — ABNORMAL HIGH (ref 0.2–1.2)
Total Protein: 7.1 g/dL (ref 6.0–8.3)

## 2019-02-12 LAB — HEMOGLOBIN A1C: Hgb A1c MFr Bld: 7.1 % — ABNORMAL HIGH (ref 4.6–6.5)

## 2019-02-12 LAB — TSH: TSH: 2.85 u[IU]/mL (ref 0.35–4.50)

## 2019-02-12 LAB — LIPID PANEL
Cholesterol: 161 mg/dL (ref 0–200)
HDL: 42.5 mg/dL (ref >39.00)
LDL Cholesterol: 96 mg/dL (ref 0–99)
NonHDL: 118.14
Total CHOL/HDL Ratio: 4
Triglycerides: 112 mg/dL (ref 0.0–149.0)
VLDL: 22.4 mg/dL (ref 0.0–40.0)

## 2019-02-12 LAB — VITAMIN B12: Vitamin B-12: 300 pg/mL (ref 211–911)

## 2019-02-12 LAB — FOLATE: Folate: 10.4 ng/mL (ref >5.9)

## 2019-02-12 MED ORDER — LEVOTHYROXINE SODIUM 125 MCG PO TABS: ORAL_TABLET | ORAL | 0 refills | Status: DC

## 2019-02-12 NOTE — Telephone Encounter (Signed)
Patient is needing a refill on Levothyroxine (90 days) Patient is completely out.  Pharmacy: [facility]

## 2019-02-12 NOTE — Telephone Encounter (Signed)
Refill was sent in 

## 2019-02-13 LAB — MICROALBUMIN / CREATININE URINE RATIO
Creatinine,U: 130.9 mg/dL
Microalb Creat Ratio: 2.7 mg/g (ref 0.0–30.0)
Microalb, Ur: 3.6 mg/dL — ABNORMAL HIGH (ref 0.0–1.9)

## 2019-02-19 ENCOUNTER — Encounter: Payer: Self-pay | Admitting: Family Medicine

## 2019-02-19 ENCOUNTER — Other Ambulatory Visit: Payer: Self-pay

## 2019-02-19 ENCOUNTER — Ambulatory Visit (INDEPENDENT_AMBULATORY_CARE_PROVIDER_SITE_OTHER): Payer: PPO | Admitting: Family Medicine

## 2019-02-19 VITALS — BP 102/80 | HR 64 | Temp 97.8°F | Ht 71.0 in | Wt 192.1 lb

## 2019-02-19 DIAGNOSIS — E1143 Type 2 diabetes mellitus with diabetic autonomic (poly)neuropathy: Secondary | ICD-10-CM

## 2019-02-19 DIAGNOSIS — I10 Essential (primary) hypertension: Secondary | ICD-10-CM | POA: Diagnosis not present

## 2019-02-19 DIAGNOSIS — E890 Postprocedural hypothyroidism: Secondary | ICD-10-CM | POA: Diagnosis not present

## 2019-02-19 NOTE — Patient Instructions (Signed)
Cut out sweet potatoes, potatoes, rice, pasta.   A carbohydrate serving is 15 grams of carbs. You should look for 3 carb servings per meal. Pay attention to your carbohydrate intake because even healthy foods like vegetables and fruits can affect this.

## 2019-02-19 NOTE — Progress Notes (Signed)
Jeff Lopez DOB: [birth year] Encounter date: 02/19/2019  This is a 68 y.o. male who presents for a chronic condition visit.     Chief Complaint  Patient presents with  . Follow-up   Walking 4 miles/day at park.   Has given up sweet tea, but hasn't been able to give up soda yet. Not doing a lot of sweets. Drinks about 2L q 3-4 days. Used to drink 1/2 L daily. Always drinks regular soda.   Sometimes gets charlie horse left calf at night. Has to stand to relieve this.   Diabetes Mellitus Type 2: Current symptoms/problems include: none  Medication compliance:  compliant most of the time Diabetic diet compliance:  compliant most of the time,  Weight trend: stable/slightly decreasing. Current exercise: walking daily.  Barriers: soda intake.   Home blood sugar records: not checking Any episodes of hypoglycemia? no  reports that he quit smoking about 11 years ago. His smoking use included cigarettes. He has a 22.00 pack-year smoking history. He has never used smokeless tobacco.  Daily Aspirin? No ACE/ARB? Yes Statin? Patient does not want to take statin  Hypertension:  Home blood pressure monitoring: no.  He is not adherent to a low sodium diet. Patient denies chest pain, shortness of breath, peripheral edema, palpitations.  Antihypertensive medication side effects: no medication side effects noted.  Use of agents associated with hypertension: NSAIDS.   Hyperlipidemia:  Trying to work on diet/exercise.   A1C:No results found for: LABA1C Micro:  Lab Results  Component Value Date   CREATININE 1.32 02/12/2019   CREATININE 1.18 09/10/2015   CBC:  Lab Results  Component Value Date   WBC 6.0 02/12/2019   HGB 15.9 02/12/2019   HGB 13.8 04/30/2014   HCT 47.0 02/12/2019   HCT 42.0 04/30/2014   MCH 33.5 04/23/2015   MCHC 33.9 02/12/2019   RDW 12.5 02/12/2019   RDW 13.1 04/30/2014   PLT 189.0 02/12/2019   PLT 215 04/30/2014   CMP:  Lab Results  Component Value Date   NA 139  02/12/2019   NA 140 07/05/2017   K 4.1 02/12/2019   CL 103 02/12/2019   CO2 25 02/12/2019   ANIONGAP 12 04/21/2015   GLUCOSE 176 (H) 02/12/2019   BUN 21 02/12/2019   BUN 19 07/05/2017   CREATININE 1.32 02/12/2019   CREATININE 1.18 09/10/2015   GFRAA 67 07/05/2017   CALCIUM 9.4 02/12/2019   PROT 7.1 02/12/2019   BILITOT 1.6 (H) 02/12/2019   ALKPHOS 75 02/12/2019   ALT 25 02/12/2019   AST 19 02/12/2019   LIPID:  Lab Results  Component Value Date   CHOL 161 02/12/2019   TRIG 112.0 02/12/2019   HDL 42.50 02/12/2019   LDLCALC 96 02/12/2019        Allergies  Allergen Reactions  . Ceftin Other (See Comments)    Patient stated it caused "sores in mouth" Thrush  . Cefuroxime Axetil Other (See Comments)    Sores in mouth Sores in mouth Patient stated it caused "sores in mouth" Thrush other   Current Meds  Medication Sig  . ADVAIR HFA 115-21 MCG/ACT inhaler INHALE 2 PUFFS into lungs 2 TIMES DAILY  . aspirin-acetaminophen-caffeine (EXCEDRIN MIGRAINE) 250-250-65 MG tablet Take by mouth every 6 (six) hours as needed for headache.  . cholecalciferol (VITAMIN D3) 25 MCG (1000 UT) tablet Take 1,000 Units by mouth daily.  . eszopiclone (LUNESTA) 2 MG TABS tablet TAKE 1 TABLET BY MOUTH IMMEDIATLEY BEFORE BEDTIME AS NEEDED FOR SLEEP  .  eszopiclone (LUNESTA) 2 MG TABS tablet Take 1 tablet (2 mg total) by mouth at bedtime as needed for sleep. Take immediately before bedtime  . irbesartan (AVAPRO) 300 MG tablet TAKE 1 TABLET(300 MG) BY MOUTH DAILY  . levothyroxine (SYNTHROID) 125 MCG tablet TAKE 1 TABLET BY MOUTH EVERY DAY BEFORE BREAKFAST  . metaxalone (SKELAXIN) 800 MG tablet Take 1 tablet (800 mg total) by mouth 3 (three) times daily.  . metFORMIN (GLUCOPHAGE XR) 500 MG 24 hr tablet Take 1 tablet (500 mg total) by mouth daily with breakfast.  . metoprolol succinate (TOPROL-XL) 50 MG 24 hr tablet TAKE 1 TABLET BY MOUTH EVERY DAY  . naproxen (NAPROSYN) 500 MG tablet Take 1 tablet  (500 mg total) by mouth 3 (three) times daily with meals.  . pantoprazole (PROTONIX) 40 MG tablet Take 1 tablet (40 mg total) by mouth 2 (two) times daily.     Review of Systems  Constitutional: Negative for chills, fatigue and fever.  Respiratory: Negative for cough, chest tightness, shortness of breath and wheezing.   Cardiovascular: Negative for chest pain, palpitations and leg swelling.   Objective:  BP 102/80 (BP Location: Left Arm, Patient Position: Sitting, Cuff Size: Normal)   Pulse 64   Temp 97.8 F (36.6 C) (Temporal)   Ht 5\' 11"  (1.803 m)   Wt 192 lb 1.6 oz (87.1 kg)   SpO2 97%   BMI 26.79 kg/m   Weight: 192 lb 1.6 oz (87.1 kg)   BP Readings from Last 3 Encounters:  02/19/19 102/80  01/27/19 110/82  11/25/18 124/84   Wt Readings from Last 3 Encounters:  02/19/19 192 lb 1.6 oz (87.1 kg)  01/27/19 195 lb 9.6 oz (88.7 kg)  11/25/18 195 lb 12.8 oz (88.8 kg)    Physical Exam Constitutional:      General: He is not in acute distress.    Appearance: He is well-developed.  HENT:     Head: Normocephalic and atraumatic.  Cardiovascular:     Rate and Rhythm: Normal rate and regular rhythm.     Heart sounds: Normal heart sounds. No murmur.  Pulmonary:     Effort: Pulmonary effort is normal.     Breath sounds: Normal breath sounds.  Abdominal:     General: Bowel sounds are normal. There is no distension.     Palpations: Abdomen is soft.     Tenderness: There is no abdominal tenderness. There is no guarding.  Skin:    General: Skin is warm and dry.     Comments: Sensory exam of the foot is normal, tested with the monofilament. Good pulses, no lesions or ulcers, good peripheral pulses.  Psychiatric:        Judgment: Judgment normal.     Assessment/Plan: Health Maintenance Due  Topic Date Due  . FOOT EXAM  10/04/2018  . INFLUENZA VACCINE  01/25/2019   Health Maintenance reviewed. Will return for flu shot  Goals Addressed   None    1. HYPERTENSION,  BENIGN Well controlled, continue current medications.   2. Type II diabetes mellitus with peripheral autonomic neuropathy [facility]) [name] still doesn't feel like he "has" diabetes because he has no family history of this and feels he eats healthy overall so should not have issue with sugars. We reviewed again his A1C, we reviewed that although there is no family history he is still clearly diabetic and we need to make sure that we are taking all measures possible to prevent complications of this condition. We reviewed  again how diet can affect sugar. We specifically reviewed some of his dietary carbs - daily potatoes (regular and sweet), soda, and even vegetables. We also gave him diabetic handout with specific foods/carb content of these. His goal is to get off of medications. We reviewed that with better carbohydrate control, his sugar control will be better and this will also help with weight loss. I do think that it would be possible for him to get off some of medications with these healthy lifestyle changes. He is going to work towards this with recheck bloodwork in 3 months time.  - Hemoglobin A1c; Future - HM DIABETES FOOT EXAM; Future  3. Hypothyroidism following radioiodine therapy Continue current synthroid dose.   4. Hemochromatosis, unspecified hemochromatosis type Will recheck iron levels on next set of bloodwork. - Hepatic function panel; Future - IBC + Ferritin; Future  -continue current meds -reviewed labs -encouraged a healthy diet, regular exercise and maintaining a healthy weight   Return bloodwork repeat in 3 months followed by appointment.Micheline Rough, MD

## 2019-02-24 ENCOUNTER — Other Ambulatory Visit: Payer: Self-pay | Admitting: Pulmonary Disease

## 2019-02-24 DIAGNOSIS — G478 Other sleep disorders: Secondary | ICD-10-CM

## 2019-02-26 ENCOUNTER — Other Ambulatory Visit: Payer: Self-pay | Admitting: Family Medicine

## 2019-02-26 DIAGNOSIS — E1169 Type 2 diabetes mellitus with other specified complication: Secondary | ICD-10-CM

## 2019-03-05 ENCOUNTER — Telehealth: Payer: Self-pay | Admitting: Cardiovascular Disease

## 2019-03-05 NOTE — Telephone Encounter (Signed)
Informed the patient that I have not yet called him, but scheduled him for visit with Dr. Burt Knack 10/5. He was grateful for assistance.

## 2019-03-05 NOTE — Telephone Encounter (Signed)
New Message   Patient is calling stating that he received a call but no voicemail was left. I am not showing any notes. He states that it may be about an appt. But I am not showing Dr. Burt Knack has anything available. Please follow up.

## 2019-03-12 ENCOUNTER — Other Ambulatory Visit: Payer: Self-pay | Admitting: Cardiovascular Disease

## 2019-03-25 ENCOUNTER — Other Ambulatory Visit: Payer: Self-pay | Admitting: Pulmonary Disease

## 2019-03-25 DIAGNOSIS — G478 Other sleep disorders: Secondary | ICD-10-CM

## 2019-03-25 NOTE — Telephone Encounter (Signed)
Appropriate for refill

## 2019-03-25 NOTE — Telephone Encounter (Signed)
03/25/2019 1709  Medication has been sent in.  I have reviewed [place] PMP aware.  Patient will need an office visit with Dr. Ander Slade in 8 weeks for additional refills.  Please get the patient scheduled.  Please also follow-up with the patient to see if he has been seen by Dr. Corky Sing office to complete an oral appliance for management of obstructive sleep apnea.  Wyn Quaker, FNP

## 2019-03-31 ENCOUNTER — Ambulatory Visit: Payer: PPO | Admitting: Cardiovascular Disease

## 2019-05-03 ENCOUNTER — Ambulatory Visit: Payer: PPO

## 2019-05-08 ENCOUNTER — Other Ambulatory Visit: Payer: Self-pay | Admitting: Cardiovascular Disease

## 2019-05-15 ENCOUNTER — Other Ambulatory Visit: Payer: Self-pay | Admitting: *Deleted

## 2019-05-15 MED ORDER — LEVOTHYROXINE SODIUM 125 MCG PO TABS: ORAL_TABLET | ORAL | 0 refills | Status: DC

## 2019-05-15 NOTE — Telephone Encounter (Signed)
Rx done. 

## 2019-05-24 ENCOUNTER — Other Ambulatory Visit: Payer: Self-pay | Admitting: Pulmonary Disease

## 2019-05-24 DIAGNOSIS — G478 Other sleep disorders: Secondary | ICD-10-CM

## 2019-05-26 ENCOUNTER — Other Ambulatory Visit: Payer: PPO

## 2019-05-27 ENCOUNTER — Other Ambulatory Visit: Payer: Self-pay | Admitting: Cardiovascular Disease

## 2019-05-27 NOTE — Telephone Encounter (Signed)
Received RX refill for patient's Lunesta 2mg .   Last RX was on 03/25/19 for 30 tablets and 2 refills.  Last OV was on 01/27/19 with Aaron Edelman. Patient was advised to follow up in 3 months (around 04/29/2019) with Dr. Ander Slade. So far he has not done this.    AO, please advise if you are ok with this refill or if he will need an appt. I have pended the order. Thanks!

## 2019-05-30 ENCOUNTER — Ambulatory Visit: Payer: PPO | Admitting: Family Medicine

## 2019-06-24 ENCOUNTER — Other Ambulatory Visit: Payer: Self-pay | Admitting: Cardiovascular Disease

## 2019-06-26 ENCOUNTER — Other Ambulatory Visit: Payer: Self-pay | Admitting: *Deleted

## 2019-06-26 MED ORDER — LEVOTHYROXINE SODIUM 125 MCG PO TABS: ORAL_TABLET | ORAL | 1 refills | Status: DC

## 2019-06-26 NOTE — Telephone Encounter (Signed)
Rx done. 

## 2019-06-30 DIAGNOSIS — M180 Bilateral primary osteoarthritis of first carpometacarpal joints: Secondary | ICD-10-CM | POA: Diagnosis not present

## 2019-06-30 DIAGNOSIS — M19041 Primary osteoarthritis, right hand: Secondary | ICD-10-CM | POA: Diagnosis not present

## 2019-06-30 DIAGNOSIS — R52 Pain, unspecified: Secondary | ICD-10-CM | POA: Diagnosis not present

## 2019-07-03 DIAGNOSIS — G8929 Other chronic pain: Secondary | ICD-10-CM | POA: Diagnosis not present

## 2019-07-03 DIAGNOSIS — M180 Bilateral primary osteoarthritis of first carpometacarpal joints: Secondary | ICD-10-CM | POA: Diagnosis not present

## 2019-07-03 DIAGNOSIS — M79646 Pain in unspecified finger(s): Secondary | ICD-10-CM | POA: Diagnosis not present

## 2019-07-18 ENCOUNTER — Other Ambulatory Visit: Payer: PPO

## 2019-07-21 ENCOUNTER — Ambulatory Visit (INDEPENDENT_AMBULATORY_CARE_PROVIDER_SITE_OTHER): Payer: PPO | Admitting: Cardiovascular Disease

## 2019-07-21 ENCOUNTER — Other Ambulatory Visit: Payer: Self-pay

## 2019-07-21 ENCOUNTER — Encounter: Payer: Self-pay | Admitting: Cardiovascular Disease

## 2019-07-21 VITALS — BP 138/88 | HR 54 | Ht 71.0 in | Wt 197.1 lb

## 2019-07-21 DIAGNOSIS — I2510 Atherosclerotic heart disease of native coronary artery without angina pectoris: Secondary | ICD-10-CM

## 2019-07-21 DIAGNOSIS — E782 Mixed hyperlipidemia: Secondary | ICD-10-CM

## 2019-07-21 DIAGNOSIS — I10 Essential (primary) hypertension: Secondary | ICD-10-CM | POA: Diagnosis not present

## 2019-07-21 NOTE — Progress Notes (Signed)
Cardiology Office Note:    Date:  07/22/2019   ID:  Jeff Lopez, DOB May 02, 1951, MRN [id number]  PCP:  Caren Macadam, MD  Cardiologist:  Sherren Mocha, MD  Electrophysiologist:  None   Referring MD: Caren Macadam, MD   Chief Complaint  Patient presents with  . Coronary Artery Disease    History of Present Illness:    Jeff Lopez is a 69 y.o. male with a hx of nonobstructive coronary artery disease, hypertension, COPD, diabetes, and hyperlipidemia.  He is here alone today.  He had lost a good bit of weight but has gained some of it back during the COVID-19 pandemic.  He denies any symptoms of chest pain or pressure.  He denies shortness of breath, edema, orthopnea, or PND.  No heart palpitations.  Has been compliant with his medications.  Biggest complaint is generalized fatigue.  Past Medical History:  Diagnosis Date  . CAD, NATIVE VESSEL 04/19/2010   nonobstructive by cath 2007:  oLAD 20-30%, mLAD 50%, pCFX 20-30%, oAVCFX 20-30%, L renal art 50%;  normal LVF  . Cataract    bil cataracts removed  . COPD (chronic obstructive pulmonary disease) ([place])   . Diabetes mellitus without complication ([place])   . GERD 04/09/2007  . Headache(784.0)   . HYPERLIPIDEMIA 04/09/2007   Patient denies.  [name] HYPERTENSION, BENIGN 04/19/2010  . Hypothyroidism   . Hypothyroidism    previous hyperthyroidism, s/p I-131  . LUMBAR DISC DISORDER 05/27/2010  . Nerve damage    Left leg  . OSTEOARTHRITIS, LUMBAR SPINE 04/09/2007  . RENAL CYST 05/27/2010  . Spinal headache    After having back surgery in 2014    Past Surgical History:  Procedure Laterality Date  . BACK SURGERY  2012,2014  . CARDIAC CATHETERIZATION  2007   Dr Loanne Drilling  . CHOLECYSTECTOMY    . CHOLECYSTECTOMY N/A 07/13/2013   Procedure: LAPAROSCOPIC CHOLECYSTECTOMY WITH INTRAOPERATIVE CHOLANGIOGRAM;  Surgeon: Shann Medal, MD;  Location: [facility];  Service: General;  Laterality: N/A;  . COLONOSCOPY    .  COLONOSCOPY W/ POLYPECTOMY    . ERCP N/A 07/14/2013   Procedure: ENDOSCOPIC RETROGRADE CHOLANGIOPANCREATOGRAPHY (ERCP);  Surgeon: [name], MD;  Location: [facility];  Service: Endoscopy;  Laterality: N/A;  . EYE SURGERY Bilateral    Cataract removal  . FOOT FRACTURE SURGERY    . FRACTURE SURGERY    . LUMBAR DISC SURGERY  08/06/2012   L3 & L4  . LUMBAR DISC SURGERY  11/11/2015   L1 L2    DR NITKA  . LUMBAR FUSION N/A 04/20/2015   Procedure: T12 to L1 fusion (Extension of Previous Fusion L2-S1 to T12-S1), Right Transforaminal lumbar interbody fusion, Posterior Fusion T12 to L1, with Pedicle screws, allograft, local bone graft, and  Vivigen;  Surgeon: [name], MD;  Location: [facility];  Service: Orthopedics;  Laterality: N/A;  . LUMBAR LAMINECTOMY N/A 11/10/2013   Procedure: Left L1-2 far lateral approach to excise herniated nucleus pulposus;  Surgeon: [name], MD;  Location: [facility];  Service: Orthopedics;  Laterality: N/A;  . LUMBAR LAMINECTOMY/DECOMPRESSION MICRODISCECTOMY Left 08/10/2012   Procedure: Dura Repair Left Side L2-L3;  Surgeon: [name], MD;  Location: [facility];  Service: Orthopedics;  Laterality: Left;  Wilson Frame, Sliding table, dura repair kit, microscope  . NECK SURGERY     X 2  . SPINE SURGERY  2006   C-spine surgery x 2  . UPPER GASTROINTESTINAL ENDOSCOPY  Current Medications: Current Meds  Medication Sig  . ADVAIR HFA 115-21 MCG/ACT inhaler INHALE 2 PUFFS into lungs 2 TIMES DAILY  . amLODipine (NORVASC) 5 MG tablet Take 1 tablet (5 mg total) by mouth daily.  [name] aspirin-acetaminophen-caffeine (EXCEDRIN MIGRAINE) 250-250-65 MG tablet Take by mouth every 6 (six) hours as needed for headache.  . cholecalciferol (VITAMIN D3) 25 MCG (1000 UT) tablet Take 1,000 Units by mouth daily.  . eszopiclone (LUNESTA) 2 MG TABS tablet TAKE 1 TABLET BY MOUTH IMMEDIATLEY BEFORE BEDTIME AS NEEDED FOR SLEEP  . ipratropium (ATROVENT) 0.03 % nasal spray Place 2 sprays  into the nose as needed.  . irbesartan (AVAPRO) 300 MG tablet Take 1 tablet (300 mg total) by mouth daily.  [name] levothyroxine (SYNTHROID) 125 MCG tablet TAKE 1 TABLET BY MOUTH EVERY DAY BEFORE BREAKFAST  . metFORMIN (GLUCOPHAGE-XR) 500 MG 24 hr tablet TAKE 1 TABLET BY MOUTH EVERY DAY WITH BREAKFAST  . metoprolol succinate (TOPROL-XL) 50 MG 24 hr tablet TAKE 1 TABLET BY MOUTH EVERY DAY  . pantoprazole (PROTONIX) 40 MG tablet Take 1 tablet (40 mg total) by mouth 2 (two) times daily.     Allergies:   Ceftin and Cefuroxime axetil   Social History   Socioeconomic History  . Marital status: Single    Spouse name: Not on file  . Number of children: 1  . Years of education: Not on file  . Highest education level: Not on file  Occupational History  . 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 Smoking status: Former Smoker    Packs/day: 0.50    Years: 44.00    Pack years: 22.00    Types: Cigarettes    Quit date: 04/07/2007    Years since quitting: 12.2  . Smokeless tobacco: Never Used  . Tobacco comment: pt has stopped smoking about 9 months now  Substance and Sexual Activity  . Alcohol use: No  . Drug use: No  . Sexual activity: Not Currently  Other Topics Concern  . Not on file  Social History Narrative   Works in Psychologist, educational. Retired.   Lives alone, has one child in [place].    Social Determinants of Health   Financial Resource Strain:   . Difficulty of Paying Living Expenses: Not on file  Food Insecurity:   . Worried About Charity fundraiser in the Last Year: Not on file  . Ran Out of Food in the Last Year: Not on file  Transportation Needs:   . Lack of Transportation (Medical): Not on file  . Lack of Transportation (Non-Medical): Not on file  Physical Activity:   . Days of Exercise per Week: Not on file  . Minutes of Exercise per Session: Not on file  Stress:   . Feeling of Stress : Not on file  Social Connections:   .  Frequency of Communication with Friends and Family: Not on file  . Frequency of Social Gatherings with Friends and Family: Not on file  . Attends Religious Services: Not on file  . Active Member of Clubs or Organizations: Not on file  . Attends Archivist Meetings: Not on file  . Marital Status: Not on file     Family History: [name] family history includes Breast cancer in his mother; Heart attack (age of onset: 33) in his mother; Heart disease in his mother; Hypertension in his brother; Non-Hodgkin's lymphoma in his brother; Prostate cancer in his father; Stomach cancer in  his paternal grandmother; Uterine cancer in his mother. There is no history of Thyroid disease, Colon cancer, Esophageal cancer, Rectal cancer, or Pancreatic cancer.  ROS:   Please see the history of present illness.    All other systems reviewed and are negative.  EKGs/Labs/Other Studies Reviewed:    The following studies were reviewed today: Myoview Stress Test 04-11-2018: Study Highlights    The left ventricular ejection fraction is mildly decreased (45-54%).  Nuclear stress EF: 52%.  No T wave inversion was noted during stress.  There was no ST segment deviation noted during stress.  This is a low risk study.   Normal perfusion. LVEF 52% with normal wall motion. This is a low risk study.    EKG:  EKG is ordered today.  The ekg ordered today demonstrates sinus bradycardia 54 bpm, otherwise normal  Recent Labs: 02/12/2019: ALT 25; BUN 21; Creatinine, Ser 1.32; Hemoglobin 15.9; Platelets 189.0; Potassium 4.1; Sodium 139; TSH 2.85  Recent Lipid Panel    Component Value Date/Time   CHOL 161 02/12/2019 1006   TRIG 112.0 02/12/2019 1006   HDL 42.50 02/12/2019 1006   CHOLHDL 4 02/12/2019 1006   VLDL 22.4 02/12/2019 1006   LDLCALC 96 02/12/2019 1006   LDLDIRECT 115.6 11/28/2012 0947    Physical Exam:    VS:  BP 138/88   Pulse (!) 54   Ht '5\' 11"'  (1.803 m)   Wt 197 lb 1.9 oz  (89.4 kg)   SpO2 95%   BMI 27.49 kg/m     Wt Readings from Last 3 Encounters:  07/21/19 197 lb 1.9 oz (89.4 kg)  02/19/19 192 lb 1.6 oz (87.1 kg)  01/27/19 195 lb 9.6 oz (88.7 kg)     GEN:  Well nourished, well developed in no acute distress HEENT: Normal NECK: No JVD; No carotid bruits LYMPHATICS: No lymphadenopathy CARDIAC: RRR, no murmurs, rubs, gallops RESPIRATORY:  Clear to auscultation without rales, wheezing or rhonchi  ABDOMEN: Soft, non-tender, non-distended MUSCULOSKELETAL:  No edema; No deformity  SKIN: Warm and dry NEUROLOGIC:  Alert and oriented x 3 PSYCHIATRIC:  Normal affect   ASSESSMENT:    1. CAD in native artery   2. Essential hypertension   3. Mixed hyperlipidemia    PLAN:    In order of problems listed above:  1. Patient is stable without angina.  He had a normal Myoview stress scan about 15 months ago.  We will continue with medical therapy and plan to see him back in 1 year.  Discussed ongoing efforts at secondary risk reduction with lifestyle modification. 2. Blood pressure is controlled on a combination of ARB, beta-blocker, and amlodipine.  Amlodipine was added at last year's visit with better blood pressure control. 3. The patient was remotely treated with statin drug.  He has not been inclined to take lipid-lowering therapy.   Medication Adjustments/Labs and Tests Ordered: Current medicines are reviewed at length with the patient today.  Concerns regarding medicines are outlined above.  Orders Placed This Encounter  Procedures  . EKG 12-Lead   No orders of the defined types were placed in this encounter.   Patient Instructions  Medication Instructions:  Your provider recommends that you continue on your current medications as directed. Please refer to the Current Medication list given to you today.   *If you need a refill on your cardiac medications before your next appointment, please call your pharmacy*   Follow-Up: At [facility],  you and your health needs are our priority.  As part of our continuing mission to provide you with exceptional heart care, we have created designated Provider Care Teams.  These Care Teams include your primary Cardiologist (physician) and Advanced Practice Providers (APPs -  Physician Assistants and Nurse Practitioners) who all work together to provide you with the care you need, when you need it. Your next appointment:   12 month(s) The format for your next appointment:   In Person Provider:   You may see Sherren Mocha, MD or one of the following Advanced Practice Providers on your designated Care Team:    Richardson Dopp, PA-C  Vin Ravenden, PA-C  Daune Perch, [place]    Signed, Sherren Mocha, MD  07/22/2019 5:03 PM    [place]

## 2019-07-21 NOTE — Patient Instructions (Signed)

## 2019-07-23 ENCOUNTER — Other Ambulatory Visit: Payer: PPO

## 2019-07-24 ENCOUNTER — Other Ambulatory Visit: Payer: Self-pay

## 2019-07-24 ENCOUNTER — Other Ambulatory Visit (INDEPENDENT_AMBULATORY_CARE_PROVIDER_SITE_OTHER): Payer: PPO

## 2019-07-24 DIAGNOSIS — E1143 Type 2 diabetes mellitus with diabetic autonomic (poly)neuropathy: Secondary | ICD-10-CM

## 2019-07-24 LAB — HEPATIC FUNCTION PANEL
ALT: 29 U/L (ref 0–53)
AST: 19 U/L (ref 0–37)
Albumin: 4.4 g/dL (ref 3.5–5.2)
Alkaline Phosphatase: 73 U/L (ref 39–117)
Bilirubin, Direct: 0.2 mg/dL (ref 0.0–0.3)
Total Bilirubin: 1.5 mg/dL — ABNORMAL HIGH (ref 0.2–1.2)
Total Protein: 6.6 g/dL (ref 6.0–8.3)

## 2019-07-24 LAB — IBC + FERRITIN
Ferritin: 433.3 ng/mL — ABNORMAL HIGH (ref 22.0–322.0)
Iron: 197 ug/dL — ABNORMAL HIGH (ref 42–165)
Saturation Ratios: 66.1 % — ABNORMAL HIGH (ref 20.0–50.0)
Transferrin: 213 mg/dL (ref 212.0–360.0)

## 2019-07-24 LAB — HEMOGLOBIN A1C: Hgb A1c MFr Bld: 7.5 % — ABNORMAL HIGH (ref 4.6–6.5)

## 2019-07-28 ENCOUNTER — Other Ambulatory Visit: Payer: Self-pay | Admitting: Cardiovascular Disease

## 2019-07-30 ENCOUNTER — Encounter: Payer: Self-pay | Admitting: Family Medicine

## 2019-07-30 ENCOUNTER — Other Ambulatory Visit: Payer: Self-pay

## 2019-07-30 ENCOUNTER — Ambulatory Visit (INDEPENDENT_AMBULATORY_CARE_PROVIDER_SITE_OTHER): Payer: PPO | Admitting: Family Medicine

## 2019-07-30 VITALS — BP 128/84 | HR 60 | Temp 97.2°F | Ht 71.0 in | Wt 197.6 lb

## 2019-07-30 DIAGNOSIS — I10 Essential (primary) hypertension: Secondary | ICD-10-CM

## 2019-07-30 DIAGNOSIS — E1143 Type 2 diabetes mellitus with diabetic autonomic (poly)neuropathy: Secondary | ICD-10-CM

## 2019-07-30 DIAGNOSIS — E1169 Type 2 diabetes mellitus with other specified complication: Secondary | ICD-10-CM | POA: Diagnosis not present

## 2019-07-30 DIAGNOSIS — R0981 Nasal congestion: Secondary | ICD-10-CM

## 2019-07-30 DIAGNOSIS — E785 Hyperlipidemia, unspecified: Secondary | ICD-10-CM | POA: Diagnosis not present

## 2019-07-30 DIAGNOSIS — E890 Postprocedural hypothyroidism: Secondary | ICD-10-CM

## 2019-07-30 DIAGNOSIS — E559 Vitamin D deficiency, unspecified: Secondary | ICD-10-CM | POA: Diagnosis not present

## 2019-07-30 NOTE — Patient Instructions (Addendum)
Use humidifier in room to help with congestion  Consider nasal saline gel to help with moisturizing  Continue with atrovent prescribed from ENT; do not use short acting decongestants.   vasoline in nares with qtip at bedtime

## 2019-07-30 NOTE — Progress Notes (Signed)
[name] DOB: 02-Feb-1951 Encounter date: 07/30/2019  This is a 69 y.o. male who presents with Chief Complaint  Patient presents with  . Follow-up    History of present illness: Recently followed up with cardiology.  They did discuss consideration for statin.  He declined.  Has been taking the vitamin D supplement and good about taking this daily. Seemed to really help with energy level.   *Diabetes: started drinking soda again. Reason he is drinking them is "because I like them and I'm going to drink them". Wanting to enjoy life. Trying to eat better. States that when he was eating healthy and weight was down he felt like "crap". Still drinking sweet tea, eats burgers. Drinks regular soda; doesn't like diet soda. Just one glass of soda daily. Has stopped potatoes, but is back eating these.   *Hypertension: irbesartan, toprol. *Hyperlipidemia: does not want to be on statin *Hypothyroid: synthroid 165mcg daily  COVID has messed up a lot for him- exercise, walking, etc.   Waking up with significant congestion; blows out heavy mucous and bloody chunks. Has some blood in mucous through rest of day. Worried because he has addressed with specialists and didn't seem concerned.    Allergies  Allergen Reactions  . Ceftin Other (See Comments)    Patient stated it caused "sores in mouth" Thrush  . Cefuroxime Axetil Other (See Comments)    Sores in mouth Sores in mouth Patient stated it caused "sores in mouth" Thrush other   Current Meds  Medication Sig  . amLODipine (NORVASC) 5 MG tablet TAKE 1 TABLET BY MOUTH EVERY DAY  . aspirin-acetaminophen-caffeine (EXCEDRIN MIGRAINE) 250-250-65 MG tablet Take by mouth every 6 (six) hours as needed for headache.  . cholecalciferol (VITAMIN D3) 25 MCG (1000 UT) tablet Take 1,000 Units by mouth daily.  . eszopiclone (LUNESTA) 2 MG TABS tablet TAKE 1 TABLET BY MOUTH IMMEDIATLEY BEFORE BEDTIME AS NEEDED FOR SLEEP  . irbesartan (AVAPRO) 300 MG  tablet TAKE 1 TABLET BY MOUTH EVERY DAY  . levothyroxine (SYNTHROID) 125 MCG tablet TAKE 1 TABLET BY MOUTH EVERY DAY BEFORE BREAKFAST  . metFORMIN (GLUCOPHAGE-XR) 500 MG 24 hr tablet TAKE 1 TABLET BY MOUTH EVERY DAY WITH BREAKFAST  . metoprolol succinate (TOPROL-XL) 50 MG 24 hr tablet TAKE 1 TABLET BY MOUTH EVERY DAY  . pantoprazole (PROTONIX) 40 MG tablet Take 1 tablet (40 mg total) by mouth 2 (two) times daily.    Review of Systems  Constitutional: Negative for chills, fatigue and fever.  Respiratory: Negative for cough, chest tightness, shortness of breath and wheezing.   Cardiovascular: Negative for chest pain, palpitations and leg swelling.    Objective:  BP 128/84 (BP Location: Left Arm, Patient Position: Sitting, Cuff Size: Normal)   Pulse 60   Temp (!) 97.2 F (36.2 C) (Temporal)   Ht 5\' 11"  (1.803 m)   Wt 197 lb 9.6 oz (89.6 kg)   SpO2 97%   BMI 27.56 kg/m   Weight: 197 lb 9.6 oz (89.6 kg)   BP Readings from Last 3 Encounters:  07/30/19 128/84  07/21/19 138/88  02/19/19 102/80   Wt Readings from Last 3 Encounters:  07/30/19 197 lb 9.6 oz (89.6 kg)  07/21/19 197 lb 1.9 oz (89.4 kg)  02/19/19 192 lb 1.6 oz (87.1 kg)    Physical Exam Constitutional:      General: He is not in acute distress.    Appearance: He is well-developed.  HENT:     Head: Normocephalic and  atraumatic.     Right Ear: Tympanic membrane, ear canal and external ear normal.     Left Ear: Tympanic membrane, ear canal and external ear normal.     Nose:     Comments: There is small area that appears to be source of bleeding right nares; not actively bleeding.     Mouth/Throat:     Mouth: Mucous membranes are moist.  Cardiovascular:     Rate and Rhythm: Normal rate and regular rhythm.     Heart sounds: Normal heart sounds. No murmur.  Pulmonary:     Effort: Pulmonary effort is normal.     Breath sounds: Normal breath sounds.  Abdominal:     General: Bowel sounds are normal. There is no  distension.     Palpations: Abdomen is soft.     Tenderness: There is no abdominal tenderness. There is no guarding.  Skin:    General: Skin is warm and dry.     Comments: Sensory exam of the foot is normal, tested with the monofilament. Good pulses, no lesions or ulcers, good peripheral pulses.  Psychiatric:        Judgment: Judgment normal.     Assessment/Plan  1. HYPERTENSION, BENIGN Well controlled. Continue current medication.  - Comprehensive metabolic panel; Future - CBC with Differential/Platelet; Future  2. Type II diabetes mellitus with peripheral autonomic neuropathy ([place]) Work on restarting daily exercise and limiting soda, starches. - Hemoglobin A1c; Future - HM DIABETES FOOT EXAM  3. Hypothyroidism following radioiodine therapy Continue with synthroid. bloodwork in 3 mo. - TSH; Future  4. Hyperlipidemia associated with type 2 diabetes mellitus ([place]) Does not want to take statin.  - Lipid panel; Future  5. Vitamin D deficiency Continue with daily supplement. - VITAMIN D 25 Hydroxy (Vit-D Deficiency, Fractures); Future  6. Hemochromatosis, unspecified hemochromatosis type - IBC + Ferritin; Future  7. Nasal congestion Work on hydration first - vasoline, humidifier, nasal saline gel. Update me in 1 month; consider adding in medication at that point.     Return in about 3 months (around 10/27/2019) for bloodwork in 3 mo; followed by [facility], Chronic condition visit.    Micheline Rough, MD

## 2019-08-12 ENCOUNTER — Telehealth: Payer: Self-pay | Admitting: Family Medicine

## 2019-08-12 NOTE — Progress Notes (Signed)
  Chronic Care Management   Outreach Note  08/12/2019 Name: Jeff Lopez MRN: [id number] DOB: 08-24-1950  Referred by: Caren Macadam, MD Reason for referral : No chief complaint on file.   An unsuccessful telephone outreach was attempted today. The patient was referred to the pharmacist for assistance with care management and care coordination.   Follow Up Plan:   Raynicia Dukes [employer] Scheduler

## 2019-08-22 ENCOUNTER — Other Ambulatory Visit: Payer: Self-pay | Admitting: Pulmonary Disease

## 2019-08-25 ENCOUNTER — Other Ambulatory Visit: Payer: Self-pay

## 2019-08-25 MED ORDER — ESZOPICLONE 2 MG PO TABS: ORAL_TABLET | ORAL | 0 refills | Status: DC

## 2019-08-25 NOTE — Progress Notes (Signed)
Refill request for Lunesta. Order is pending please e-scribe

## 2019-08-27 ENCOUNTER — Other Ambulatory Visit: Payer: Self-pay | Admitting: Pulmonary Disease

## 2019-08-27 MED ORDER — ESZOPICLONE 2 MG PO TABS: 2.0000 mg | ORAL_TABLET | Freq: Every evening | ORAL | 2 refills | Status: DC | PRN

## 2019-08-27 NOTE — Progress Notes (Signed)
Lunesta sent into pharmacy

## 2019-08-27 NOTE — Progress Notes (Signed)
Lunesta 2 mg nightly prescribed

## 2019-09-03 ENCOUNTER — Telehealth: Payer: Self-pay | Admitting: Family Medicine

## 2019-09-03 NOTE — Progress Notes (Signed)
  Chronic Care Management   Outreach Note  09/03/2019 Name: Jeff Lopez MRN: [id number] DOB: 23-Aug-1950  Referred by: Caren Macadam, MD Reason for referral : No chief complaint on file.   A second unsuccessful telephone outreach was attempted today. The patient was referred to pharmacist for assistance with care management and care coordination.  Follow Up Plan:   Raynicia Dukes [employer] Scheduler

## 2019-09-08 ENCOUNTER — Telehealth: Payer: Self-pay | Admitting: Family Medicine

## 2019-09-08 NOTE — Chronic Care Management (AMB) (Signed)
  Chronic Care Management   Note  09/08/2019 Name: Jeff Lopez MRN: [id number] DOB: 14-Feb-1951  Jeff Lopez is a 69 y.o. year old male who is a primary care patient of Koberlein, Steele Berg, MD. I reached out to Jeff Lopez by phone today in response to a referral sent by [name] PCP, Caren Macadam, MD.   [name] was given information about Chronic Care Management services today including:  1. CCM service includes personalized support from designated clinical staff supervised by his physician, including individualized plan of care and coordination with other care providers 2. 24/7 contact phone numbers for assistance for urgent and routine care needs. 3. Service will only be billed when office clinical staff spend 20 minutes or more in a month to coordinate care. 4. Only one practitioner may furnish and bill the service in a calendar month. 5. The patient may stop CCM services at any time (effective at the end of the month) by phone call to the office staff.   Patient agreed to services and verbal consent obtained.   Follow up plan:   Raynicia Dukes [employer] Scheduler

## 2019-09-09 ENCOUNTER — Ambulatory Visit: Payer: PPO | Admitting: Pulmonary Disease

## 2019-09-16 ENCOUNTER — Other Ambulatory Visit: Payer: Self-pay

## 2019-09-16 ENCOUNTER — Ambulatory Visit (AMBULATORY_SURGERY_CENTER): Payer: Self-pay

## 2019-09-16 VITALS — Temp 97.5°F | Ht 71.0 in | Wt 198.0 lb

## 2019-09-16 DIAGNOSIS — Z86010 Personal history of colonic polyps: Secondary | ICD-10-CM

## 2019-09-16 NOTE — Progress Notes (Signed)
No allergies to soy or egg Pt is not on blood thinners or diet pills Denies issues with sedation/intubation Denies atrial flutter/fib Denies constipation   Emmi instructions given to pt  Pt is aware of Covid safety and care partner requirements.  

## 2019-09-22 NOTE — Chronic Care Management (AMB) (Signed)
Chronic Care Management Pharmacy  Name: LINNELL LUEDEMAN  MRN: [id number] DOB: 07-20-1950  Initial Questions: 1. Have you seen any other providers since your last visit? NA 2. Any changes in your medicines or health? No   Chief Complaint/ HPI  [name],  69 y.o. , male presents for their Initial CCM visit with the clinical pharmacist via telephone due to COVID-19 Pandemic  PCP : Caren Macadam, MD  Their chronic conditions include: DM, HTN, HLD/atherosclerosis of native coronary artery of native heart, Hypothyroidism, GERD, Vitamin D deficiency, Insomnia   Office Visits: 07/30/2019- Patient presented to Dr. Micheline Rough, MD for follow up. OV BP: 128/61mmHg. Patient to continue current regimen. For DM, patient to work on restarting daily exercise and limiting soda and starches. Patient declined statin. Patient to obtain TSH in 3 months and the following labs were ordered: CMP, CBC, Hemoglobin A1c, TSH, lipid panel, vitamin D25, IBC+ ferritin. For nasal congestion, patient to try hydration first (vasoline, humidifier, nasal saline gel) and update in 1 month. Labs scheduled on 4/26.   Consult Visit: 07/21/2019- Cardiology- Patient presented to Dr. Sherren Mocha, MD for coronary artery disease. BP at office visit: 138/54mmHg. Last Myoview stress test: 04/11/2018. Normal perfusion, LVEF: 52% with normal wall motion. Low risk study. Patient to continue ARB, beta blocker, and amlodipine for blood pressure. Patient declined lipid-lowering therapy.   07/03/2019- Occupational therapy- Patient presented to [facility] for chronic pain of both thumbs. Patient referred for fabrication and fitting of bilateral hard and soft orthoses.   06/30/2019- Orthopedic surgery- Patient presented to Dr. Sheran Luz, MD for reexamination of bilateral hands. Patient reported pain reoccurrence of both thumbs for past 3 to 4 weeks. Patient advised to use glucosamine chondroitin sulfate.  Patient to have new splints fabricated and prescribed meloxicam 7.5mg , 1 tablet daily. Patient to return in 4 to 6 weeks.   Medications: Outpatient Encounter Medications as of 09/23/2019  Medication Sig  . amLODipine (NORVASC) 5 MG tablet TAKE 1 TABLET BY MOUTH EVERY DAY  . cholecalciferol (VITAMIN D3) 25 MCG (1000 UT) tablet Take 1,000 Units by mouth daily.  . irbesartan (AVAPRO) 300 MG tablet TAKE 1 TABLET BY MOUTH EVERY DAY  . levothyroxine (SYNTHROID) 125 MCG tablet TAKE 1 TABLET BY MOUTH EVERY DAY BEFORE BREAKFAST  . metFORMIN (GLUCOPHAGE-XR) 500 MG 24 hr tablet TAKE 1 TABLET BY MOUTH EVERY DAY WITH BREAKFAST  . metoprolol succinate (TOPROL-XL) 50 MG 24 hr tablet TAKE 1 TABLET BY MOUTH EVERY DAY  . pantoprazole (PROTONIX) 40 MG tablet Take 1 tablet (40 mg total) by mouth 2 (two) times daily.   No facility-administered encounter medications on file as of 09/23/2019.    Current Diagnosis/Assessment:  Goals Addressed            This Visit's Progress   . Pharmacy Care Plan       CARE PLAN ENTRY  Current Barriers:  . Chronic Disease Management support, education, and care coordination needs related to HTN, HLD/ atherosclerosis of native coronary artery of native heart, DMII, and Hypothyroidism, GERD, Vitamin D deficiency, Insomnia  Pharmacist Clinical Goal(s):  [name] Work with the care management team to address educational, disease management, and care coordination needs  . Call provider office for new or worsened signs and symptoms  . Continue self health monitoring activities as directed today (blood pressure monitoring)  . Call care management team with questions or concerns. .  Diabetes:  [name] Achieve A1c <7.0% or 6.5% (based on  your provider). . Recent A1c: 7.5 (07/24/2019) . Within this year, obtain diabetes preventative health exams (eye and foot exams yearly). . Blood pressure:  [name] Maintain Blood pressure <130/80 mmHg.  [name] Recent blood pressure reading: 128/84 mmHg.   . Maintain low salt diet.  . High cholesterol:  . Cholesterol goals: Total Cholesterol goal under 200, Triglycerides goal under 150, HDL goal above 40 (men) or above 50 (women), LDL goal under 70.  [name] Recent cholesterol levels (02/12/2019) - Total cholesterol: 161 - Triglycerides: 112 - HDL: 42.50 - LDL: 96 . Hypothyroidism . Maintain TSH between 0.45 to 4.5uIU/ml. . Current TSH: 2.85 (02/12/2019) . GERD/ heartburn . Minimize reflux symptoms.  . Vitamin D deficiency . Maintain Vitamin D-25 level at or above 30.  [name] Last Vitamin D-25 (10/03/2017): 24.43 . Insomnia . Continue to see improvement in insomnia.   Interventions: . Comprehensive medication review performed. . Lifestyle modifications . Engaging in at least 150 minutes per week of moderate-intensity exercise such as brisk walking (15- to 20-minute mile) or something similar. Recommended they incorporate flexibility, balance, and some type of strength training exercises.  . Diabetes . Discussed importance of diabetes preventative health exams. . Obtain diabetic eye exam every 1 to 2 years.  . Obtain at least once yearly foot exams. . Continue: metformin ER 500mg , 1 tablet once daily with breakfast  . Blood pressure:  . Discussed need to continue checking blood pressure at home.  . Discussed diet modifications. DASH diet:  following a diet emphasizing fruits and vegetables and low-fat dairy products along with whole grains, fish, poultry, and nuts. Reducing red meats and sugars.  . Exercising- see exercising section above . Reducing the amount of salt intake to 1500mg /per day.  . Recommend using a salt substitute to replace your salt if you need flavor.      . Weight reduction- We discussed losing 5-10% of body weight . Continue:  - amlodipine 5mg , 1 tablet once daily  - irbesartan 300mg , 1 tablet once daily - metoprolol succinate 50mg , 1 tablet once daily  . Cholesterol/ atherosclerosis of native coronary artery of native  heart . How to reduce cholesterol through diet/weight management and physical activity.    . We discussed how a diet high in plant sterols (fruits/vegetables/nuts/whole grains/legumes) may reduce your cholesterol.  Encouraged increasing fiber to a daily intake of 10-25g/day  . Continue: diet modifications . Obtain cholesterol blood work (lipid panel) on 10/20/2019 and discuss addition of statins at your next visit with Dr. Ethlyn Gallery on 10/27/2019.  [name] Hypothyroidism . Continue: levothyroxine 134mcg, 1 tablet once daily before breakfast  . Obtain blood work for TSH on 10/20/2019 and for reassessment of levels and medication. . Insomnia:  . Discussed non-pharmacological interventions for insomnia. (Avoid napping, limit exposure to technology near bedtime, etc) . Continue: to work on sleep hygiene.  . GERD/ heart burn: . Discussed non-pharmacological interventions for acid reflux. Take measures to prevent acid reflux, such as avoiding spicy foods, avoiding caffeine, avoid laying down a few hours after eating, and raising the head of the bed. . Continue: pantoprazole 40mg , 1 tablet twice daily (or as directed by your provider) . Vitamin D deficiency . Continue: cholecalciferol (Vitamin D3) 78mcg (1000 units), 1 capsule once daily  . Obtain blood work for vitamin D on 10/20/2019 and for reassessment of levels and medication.   Patient Self Care Activities:  . Self administers medications as prescribed and Calls provider office for new concerns or questions . Continue current medications  as directed by providers.  . Continue following up with primary care provider and/or specialists. . Continue at home blood pressure readings. . Continue working on health habits (diet/ exercise).  Initial goal documentation        Diabetes   Recent Relevant Labs: Lab Results  Component Value Date/Time   HGBA1C 7.5 (H) 07/24/2019 09:58 AM   HGBA1C 7.1 (H) 02/12/2019 10:06 AM   MICROALBUR 3.6 (H)  02/12/2019 10:06 AM   MICROALBUR 13.1 (H) 07/31/2018 08:44 AM    Patient has failed these meds in past: Jardiance  Patient is currently uncontrolled on the following medications:  - metformin ER 500mg , 1 tablet once daily with breakfast   Last diabetic Eye exam: No results found for: HMDIABEYEEXA  - recommended obtaining yearly eye exams - patient states he plans on making appt for this year   Last diabetic Foot exam: No results found for: HMDIABFOOTEX  - recommended at least once yearly foot exams  - patient states he obtained at last PCP visit   We discussed: diet and exercise extensively  . Preventative diabetic health exams . Maintain a low carbohydrate diet, eating 45 grams of carbohydrates per meal (3 servings of carbohydrates per meal), 15 grams of carbohydrates per snack (1 serving of carbohydrate per snack).  . Discussed in detail diabetes classification and etiology.  . Diet . Patient states he is working on decreasing soda intake. He used to drink sweet tea, but was able to eliminate. . Patient states he has started to look at nutrition labels. He aims to stay away from high carbs and high sugars.   Exercise  Patient mentions since COVID, he has not been able to exercise as before. However, he does plan to be outside more when weather starts warming up (will go on walks for 5 miles (2.3 mile track). Patient has goal weight of 185 lbs.    Plan Patient would like to get off metformin and plans to work on lifestyle modifications. Continue current medications and control with diet and exercise   Hypertension   BP today is:  <130/80  Office blood pressures are  BP Readings from Last 3 Encounters:  07/30/19 128/84  07/21/19 138/88  02/19/19 102/80   Patient has failed these meds in the past: eplerenone, losartan, spironolactone (gynecomastia)   Patient checks BP at home has not been checking at home since his BP monitor broke; he used to check on daily basis. Patient  plans on buying new BP monitor.   Patient is controlled on:  - amlodipine 5mg , 1 tablet once daily  - irbesartan 300mg , 1 tablet once daily - metoprolol succinate 50mg , 1 tablet once daily   We discussed diet and exercise extensively . DASH diet:  following a diet emphasizing fruits and vegetables and low-fat dairy products along with whole grains, fish, poultry, and nuts. Reducing red meats and sugars.   . Exercising . Reducing the amount of salt intake to 1500mg /per day.  . Recommend using a salt substitute to replace your salt if you need flavor.    . Patient stated he does not add salt intake. "has not used salt shaker in 3 years"  . Discussed sodium content in processed foods.  . Weight reduction- We discussed losing 5-10% of body weight. . Patient aims for weight goal of: 185 lbs.  . Discussed mechanisms of action of each BP medication and need for each one for BP control.  Plan Managed by cardiologist.  Continue current medications and control  with diet and exercise.    Hyperlipidemia/ atherosclerosis of native coronary artery of native heart   Lipid Panel     Component Value Date/Time   CHOL 161 02/12/2019 1006   TRIG 112.0 02/12/2019 1006   HDL 42.50 02/12/2019 1006   CHOLHDL 4 02/12/2019 1006   VLDL 22.4 02/12/2019 1006   LDLCALC 96 02/12/2019 1006   LDLDIRECT 115.6 11/28/2012 0947    The 10-year ASCVD risk score Mikey Bussing DC Jr., et al., 2013) is: 29%   Values used to calculate the score:     Age: 73 years     Sex: Male     Is Non-Hispanic African American: No     Diabetic: Yes     Tobacco smoker: No     Systolic Blood Pressure: 0000000 mmHg     Is BP treated: Yes     HDL Cholesterol: 42.5 mg/dL     Total Cholesterol: 161 mg/dL   Patient has failed these meds in past: atorvastatin, simvastatin  Patient is currently uncontrolled on the following medications:  - managed by diet   We discussed:  diet and exercise extensively . How to reduce cholesterol through  diet/weight management and physical activity.    . We discussed different cholesterol components and cholesterol goals. . Discussed statin benefits in patients with diabetes even with normal cholesterol levels.    Plan Patient stated in the past he was on and off statins. Patient open to discuss further with Dr. Ethlyn Gallery about cholesterol in general and addition of statins at next visit.  Patient to obtain lipid panel on 10/20/19.  Continue control with diet and exercise.   Hypothyroidism   TSH  Date Value Ref Range Status  02/12/2019 2.85 0.35 - 4.50 uIU/mL Final    Patient is currently controlled on the following medications:  - levothyroxine 167mcg, 1 tablet once daily before breakfast   We discussed:  consistent levothyroxine administration in regards to food and other medications  - patient states he takes all of his medications together. Discussed to keep administration consistent   Plan Patient to obtain TSH on 10/20/19.  Continue current medications.  GERD  Patient has failed these meds in past: famotidine Patient is currently controlled on the following medications:  - pantoprazole 40mg , 1 tablet twice daily (patient reports decreasing to once daily on his own and it has not affected his heartburn)   We discussed:  non-pharmacological interventions for acid reflux. Take measures to prevent acid reflux, such as avoiding spicy foods, avoiding caffeine, avoid laying down a few hours after eating, and raising the head of the bed.  - patient triggers: chocolate and green peppers - endorses sx when he misses dose.   Plan Continue current medications.    Vitamin D deficiency  Patient is currently managed on the following medications: - cholecalciferol 45mcg (1000 units), 1 capsule once daily   Vitamin D-25 (10/03/17): 24.43  Plan Patient to obtain Vit D-25 on 10/20/19.  Continue current medications.  Insomnia  Patient has failed these meds in past: Lunesta (used for 3  to 5 months, but stopped due to abnormal dreams), melatonin 3mg   Patient is currently on the following medications:  - currently not on any medications - patient endorses being able to sleep after eating a snack (peanut butter crackers)    We discussed:  non-pharmacological interventions for insomnia. (Avoid napping, limit exposure to technology near bedtime, etc)  - patient endorses taking 2 hour naps. Discussed minimizing naps to improve insomnia.  Plan Managed with pulmonary.  Continue working on sleep hygiene.   Medication Management  Patient organizes medications: uses pill box; patient takes all medications in the morning  Barriers: denies financial barriers in obtaining medications. Adherence: no gaps in refill history (per medication dispense history 03/27/2019 to 09/23/2019)  Follow up Follow up visit with PharmD in 6 months.  Will conduct general telephone calls for periodic check-ins before next visit.   Anson Crofts, PharmD Clinical Pharmacist [facility] [place] [phone]

## 2019-09-23 ENCOUNTER — Ambulatory Visit: Payer: PPO

## 2019-09-23 ENCOUNTER — Other Ambulatory Visit: Payer: Self-pay

## 2019-09-23 DIAGNOSIS — E559 Vitamin D deficiency, unspecified: Secondary | ICD-10-CM

## 2019-09-23 DIAGNOSIS — I10 Essential (primary) hypertension: Secondary | ICD-10-CM

## 2019-09-23 DIAGNOSIS — E785 Hyperlipidemia, unspecified: Secondary | ICD-10-CM

## 2019-09-23 DIAGNOSIS — E890 Postprocedural hypothyroidism: Secondary | ICD-10-CM

## 2019-09-23 DIAGNOSIS — E1143 Type 2 diabetes mellitus with diabetic autonomic (poly)neuropathy: Secondary | ICD-10-CM

## 2019-09-23 DIAGNOSIS — I25119 Atherosclerotic heart disease of native coronary artery with unspecified angina pectoris: Secondary | ICD-10-CM

## 2019-09-23 DIAGNOSIS — E1169 Type 2 diabetes mellitus with other specified complication: Secondary | ICD-10-CM

## 2019-09-23 DIAGNOSIS — G4700 Insomnia, unspecified: Secondary | ICD-10-CM

## 2019-09-24 NOTE — Patient Instructions (Addendum)
Visit Information  Goals Addressed            This Visit's Progress   . Pharmacy Care Plan       CARE PLAN ENTRY  Current Barriers:  . Chronic Disease Management support, education, and care coordination needs related to HTN, HLD/ atherosclerosis of native coronary artery of native heart, DMII, and Hypothyroidism, GERD, Vitamin D deficiency, Insomnia  Pharmacist Clinical Goal(s):  [name] Work with the care management team to address educational, disease management, and care coordination needs  . Call provider office for new or worsened signs and symptoms  . Continue self health monitoring activities as directed today (blood pressure monitoring)  . Call care management team with questions or concerns. .  Diabetes:  [name] Achieve A1c <7.0% or 6.5% (based on your provider). . Recent A1c: 7.5 (07/24/2019) . Within this year, obtain diabetes preventative health exams (eye and foot exams yearly). . Blood pressure:  [name] Maintain Blood pressure <130/80 mmHg.  [name] Recent blood pressure reading: 128/84 mmHg.  . Maintain low salt diet.  . High cholesterol:  . Cholesterol goals: Total Cholesterol goal under 200, Triglycerides goal under 150, HDL goal above 40 (men) or above 50 (women), LDL goal under 70.  [name] Recent cholesterol levels (02/12/2019) - Total cholesterol: 161 - Triglycerides: 112 - HDL: 42.50 - LDL: 96 . Hypothyroidism . Maintain TSH between 0.45 to 4.5uIU/ml. . Current TSH: 2.85 (02/12/2019) . GERD/ heartburn . Minimize reflux symptoms.  . Vitamin D deficiency . Maintain Vitamin D-25 level at or above 30.  [name] Last Vitamin D-25 (10/03/2017): 24.43 . Insomnia . Continue to see improvement in insomnia.   Interventions: . Comprehensive medication review performed. . Lifestyle modifications . Engaging in at least 150 minutes per week of moderate-intensity exercise such as brisk walking (15- to 20-minute mile) or something similar. Recommended they incorporate flexibility, balance, and  some type of strength training exercises.  . Diabetes . Discussed importance of diabetes preventative health exams. . Obtain diabetic eye exam every 1 to 2 years.  . Obtain at least once yearly foot exams. . Continue: metformin ER 500mg , 1 tablet once daily with breakfast  . Blood pressure:  . Discussed need to continue checking blood pressure at home.  . Discussed diet modifications. DASH diet:  following a diet emphasizing fruits and vegetables and low-fat dairy products along with whole grains, fish, poultry, and nuts. Reducing red meats and sugars.  . Exercising- see exercising section above . Reducing the amount of salt intake to 1500mg /per day.  . Recommend using a salt substitute to replace your salt if you need flavor.      . Weight reduction- We discussed losing 5-10% of body weight . Continue:  - amlodipine 5mg , 1 tablet once daily  - irbesartan 300mg , 1 tablet once daily - metoprolol succinate 50mg , 1 tablet once daily  . Cholesterol/ atherosclerosis of native coronary artery of native heart . How to reduce cholesterol through diet/weight management and physical activity.    . We discussed how a diet high in plant sterols (fruits/vegetables/nuts/whole grains/legumes) may reduce your cholesterol.  Encouraged increasing fiber to a daily intake of 10-25g/day  . Continue: diet modifications . Obtain cholesterol blood work (lipid panel) on 10/20/2019 and discuss addition of statins at your next visit with Dr. Ethlyn Gallery on 10/27/2019.  [name] Hypothyroidism . Continue: levothyroxine 127mcg, 1 tablet once daily before breakfast  . Obtain blood work for TSH on 10/20/2019 and for reassessment of levels and medication. . Insomnia:  .  Discussed non-pharmacological interventions for insomnia. (Avoid napping, limit exposure to technology near bedtime, etc) . Continue: to work on sleep hygiene.  . GERD/ heart burn: . Discussed non-pharmacological interventions for acid reflux. Take measures to  prevent acid reflux, such as avoiding spicy foods, avoiding caffeine, avoid laying down a few hours after eating, and raising the head of the bed. . Continue: pantoprazole 40mg , 1 tablet twice daily (or as directed by your provider) . Vitamin D deficiency . Continue: cholecalciferol (Vitamin D3) 25mcg (1000 units), 1 capsule once daily  . Obtain blood work for vitamin D on 10/20/2019 and for reassessment of levels and medication.   Patient Self Care Activities:  . Self administers medications as prescribed and Calls provider office for new concerns or questions . Continue current medications as directed by providers.  . Continue following up with primary care provider and/or specialists. . Continue at home blood pressure readings. . Continue working on health habits (diet/ exercise).  Initial goal documentation        Jeff Lopez was given information about Chronic Care Management services today including:  1. CCM service includes personalized support from designated clinical staff supervised by his physician, including individualized plan of care and coordination with other care providers 2. 24/7 contact phone numbers for assistance for urgent and routine care needs. 3. Standard insurance, coinsurance, copays and deductibles apply for chronic care management only during months in which we provide at least 20 minutes of these services. Most insurances cover these services at 100%, however patients may be responsible for any copay, coinsurance and/or deductible if applicable. This service may help you avoid the need for more expensive face-to-face services. 4. Only one practitioner may furnish and bill the service in a calendar month. 5. The patient may stop CCM services at any time (effective at the end of the month) by phone call to the office staff.  Patient agreed to services and verbal consent obtained.   The patient verbalized understanding of instructions provided today and agreed  to receive a mailed copy of patient instruction and/or educational materials. Telephone follow up appointment with pharmacy team member scheduled for: 03/25/2020  Jeff Lopez, PharmD Clinical Pharmacist [facility] [place] [phone]     Insomnia Insomnia is a sleep disorder that makes it difficult to fall asleep or stay asleep. Insomnia can cause fatigue, low energy, difficulty concentrating, mood swings, and poor performance at work or school. There are three different ways to classify insomnia:  Difficulty falling asleep.  Difficulty staying asleep.  Waking up too early in the morning. Any type of insomnia can be long-term (chronic) or short-term (acute). Both are common. Short-term insomnia usually lasts for three months or less. Chronic insomnia occurs at least three times a week for longer than three months. What are the causes? Insomnia may be caused by another condition, situation, or substance, such as:  Anxiety.  Certain medicines.  Gastroesophageal reflux disease (GERD) or other gastrointestinal conditions.  Asthma or other breathing conditions.  Restless legs syndrome, sleep apnea, or other sleep disorders.  Chronic pain.  Menopause.  Stroke.  Abuse of alcohol, tobacco, or illegal drugs.  Mental health conditions, such as depression.  Caffeine.  Neurological disorders, such as Alzheimer's disease.  An overactive thyroid (hyperthyroidism). Sometimes, the cause of insomnia may not be known. What increases the risk? Risk factors for insomnia include:  Gender. Women are affected more often than men.  Age. Insomnia is more common as you get older.  Stress.  Lack of exercise.  Irregular work schedule or working night shifts.  Traveling between different time zones.  Certain medical and mental health conditions. What are the signs or symptoms? If you have insomnia, the main symptom is having trouble falling asleep or  having trouble staying asleep. This may lead to other symptoms, such as:  Feeling fatigued or having low energy.  Feeling nervous about going to sleep.  Not feeling rested in the morning.  Having trouble concentrating.  Feeling irritable, anxious, or depressed. How is this diagnosed? This condition may be diagnosed based on:  Your symptoms and medical history. Your health care provider may ask about: ? Your sleep habits. ? Any medical conditions you have. ? Your mental health.  A physical exam. How is this treated? Treatment for insomnia depends on the cause. Treatment may focus on treating an underlying condition that is causing insomnia. Treatment may also include:  Medicines to help you sleep.  Counseling or therapy.  Lifestyle adjustments to help you sleep better. Follow these instructions at home: Eating and drinking   Limit or avoid alcohol, caffeinated beverages, and cigarettes, especially close to bedtime. These can disrupt your sleep.  Do not eat a large meal or eat spicy foods right before bedtime. This can lead to digestive discomfort that can make it hard for you to sleep. Sleep habits   Keep a sleep diary to help you and your health care provider figure out what could be causing your insomnia. Write down: ? When you sleep. ? When you wake up during the night. ? How well you sleep. ? How rested you feel the next day. ? Any side effects of medicines you are taking. ? What you eat and drink.  Make your bedroom a dark, comfortable place where it is easy to fall asleep. ? Put up shades or blackout curtains to block light from outside. ? Use a white noise machine to block noise. ? Keep the temperature cool.  Limit screen use before bedtime. This includes: ? Watching TV. ? Using your smartphone, tablet, or computer.  Stick to a routine that includes going to bed and waking up at the same times every day and night. This can help you fall asleep faster.  Consider making a quiet activity, such as reading, part of your nighttime routine.  Try to avoid taking naps during the day so that you sleep better at night.  Get out of bed if you are still awake after 15 minutes of trying to sleep. Keep the lights down, but try reading or doing a quiet activity. When you feel sleepy, go back to bed. General instructions  Take over-the-counter and prescription medicines only as told by your health care provider.  Exercise regularly, as told by your health care provider. Avoid exercise starting several hours before bedtime.  Use relaxation techniques to manage stress. Ask your health care provider to suggest some techniques that may work well for you. These may include: ? Breathing exercises. ? Routines to release muscle tension. ? Visualizing peaceful scenes.  Make sure that you drive carefully. Avoid driving if you feel very sleepy.  Keep all follow-up visits as told by your health care provider. This is important. Contact a health care provider if:  You are tired throughout the day.  You have trouble in your daily routine due to sleepiness.  You continue to have sleep problems, or your sleep problems get worse. Get help right away if:  You have serious thoughts about  hurting yourself or someone else. If you ever feel like you may hurt yourself or others, or have thoughts about taking your own life, get help right away. You can go to your nearest emergency department or call:  Your local emergency services (911 in the [place]  A suicide crisis helpline, such as the [facility] at 858-304-4587. This is open 24 hours a day. Summary  Insomnia is a sleep disorder that makes it difficult to fall asleep or stay asleep.  Insomnia can be long-term (chronic) or short-term (acute).  Treatment for insomnia depends on the cause. Treatment may focus on treating an underlying condition that is causing insomnia.  Keep a sleep  diary to help you and your health care provider figure out what could be causing your insomnia. This information is not intended to replace advice given to you by your health care provider. Make sure you discuss any questions you have with your health care provider. Document Revised: 05/25/2017 Document Reviewed: 03/22/2017 Elsevier Patient Education  2020 [employer].

## 2019-09-30 ENCOUNTER — Encounter: Payer: PPO | Admitting: Gastroenterology

## 2019-10-20 ENCOUNTER — Other Ambulatory Visit: Payer: PPO

## 2019-10-23 ENCOUNTER — Encounter: Payer: Self-pay | Admitting: Gastroenterology

## 2019-10-23 DIAGNOSIS — D3132 Benign neoplasm of left choroid: Secondary | ICD-10-CM | POA: Diagnosis not present

## 2019-10-23 DIAGNOSIS — E119 Type 2 diabetes mellitus without complications: Secondary | ICD-10-CM | POA: Diagnosis not present

## 2019-10-23 LAB — HM DIABETES EYE EXAM

## 2019-10-24 ENCOUNTER — Other Ambulatory Visit: Payer: Self-pay | Admitting: *Deleted

## 2019-10-24 ENCOUNTER — Encounter: Payer: Self-pay | Admitting: Family Medicine

## 2019-10-24 DIAGNOSIS — E1169 Type 2 diabetes mellitus with other specified complication: Secondary | ICD-10-CM

## 2019-10-24 MED ORDER — METFORMIN HCL ER 500 MG PO TB24: ORAL_TABLET | ORAL | 0 refills | Status: DC

## 2019-10-27 ENCOUNTER — Ambulatory Visit: Payer: PPO | Admitting: Family Medicine

## 2019-10-29 ENCOUNTER — Other Ambulatory Visit: Payer: Self-pay

## 2019-10-29 ENCOUNTER — Encounter: Payer: Self-pay | Admitting: Gastroenterology

## 2019-10-29 ENCOUNTER — Ambulatory Visit (AMBULATORY_SURGERY_CENTER): Payer: PPO | Admitting: Gastroenterology

## 2019-10-29 VITALS — BP 98/58 | HR 57 | Temp 96.9°F | Resp 56 | Ht 71.0 in | Wt 198.0 lb

## 2019-10-29 DIAGNOSIS — D123 Benign neoplasm of transverse colon: Secondary | ICD-10-CM | POA: Diagnosis not present

## 2019-10-29 DIAGNOSIS — Z86010 Personal history of colonic polyps: Secondary | ICD-10-CM

## 2019-10-29 MED ORDER — SODIUM CHLORIDE 0.9 % IV SOLN
500.0000 mL | INTRAVENOUS | Status: DC
Start: 2019-10-29 — End: 2019-10-29

## 2019-10-29 NOTE — Progress Notes (Signed)
Called to room to assist during endoscopic procedure.  Patient ID and intended procedure confirmed with present staff. Received instructions for my participation in the procedure from the performing physician.  

## 2019-10-29 NOTE — Op Note (Signed)
[facility] Patient Name: Jeff Lopez Procedure Date: 10/29/2019 8:28 AM MRN: [id number] Endoscopist: [name] , MD Age: 69 Referring MD:  Date of Birth: [birth year] Gender: Male Account #: [id number] Procedure:                Colonoscopy Indications:              High risk colon cancer surveillance: Personal                            history of colonic polyps; colonoscopy with SML in                            03/2004 found 17mm HP polyp. Repeat colonoscopy 2013                            ([name]), done for new bleeding found two polyps,                            hemorrhoids. Polyps were adenomas on pathology.                            Recommended recall colonoscopy at 5 year interval.                            Colonoscopy Dr. Ardis Hughs 08/2016: six subCM polyps                            removed, 5 were adenomas, recommended 3 year recall Medicines:                Monitored Anesthesia Care Procedure:                Pre-Anesthesia Assessment:                           - Prior to the procedure, a History and Physical                            was performed, and patient medications and                            allergies were reviewed. [name] tolerance of                            previous anesthesia was also reviewed. The risks                            and benefits of the procedure and the sedation                            options and risks were discussed with the patient.                            All questions were answered, and informed consent  was obtained. Prior Anticoagulants: The patient has                            taken no previous anticoagulant or antiplatelet                            agents. ASA Grade Assessment: II - A patient with                            mild systemic disease. After reviewing the risks                            and benefits, the patient was deemed in   satisfactory condition to undergo the procedure.                           After obtaining informed consent, the colonoscope                            was passed under direct vision. Throughout the                            procedure, the patient's blood pressure, pulse, and                            oxygen saturations were monitored continuously. The                            Colonoscope was introduced through the anus and                            advanced to the the cecum, identified by                            appendiceal orifice and ileocecal valve. The                            colonoscopy was performed without difficulty. The                            patient tolerated the procedure well. The quality                            of the bowel preparation was good. The ileocecal                            valve, appendiceal orifice, and rectum were                            photographed. Scope In: 8:35:39 AM Scope Out: 8:51:56 AM Scope Withdrawal Time: 0 hours 12 minutes 36 seconds  Total Procedure Duration: 0 hours 16 minutes 17 seconds  Findings:                 A 5 mm polyp was found  in the transverse colon. The                            polyp was sessile. The polyp was removed with a                            cold snare. Resection and retrieval were complete.                           Multiple small-mouthed diverticula were found in                            the left colon.                           The exam was otherwise without abnormality on                            direct and retroflexion views. Complications:            No immediate complications. Estimated blood loss:                            None. Estimated Blood Loss:     Estimated blood loss: none. Impression:               - One 5 mm polyp in the transverse colon, removed                            with a cold snare. Resected and retrieved.                           - Diverticulosis in the left colon.                            - The examination was otherwise normal on direct                            and retroflexion views. Recommendation:           - Patient has a contact number available for                            emergencies. The signs and symptoms of potential                            delayed complications were discussed with the                            patient. Return to normal activities tomorrow.                            Written discharge instructions were provided to the                            patient.                           -  Resume previous diet.                           - Continue present medications.                           - Await pathology results. [name], MD 10/29/2019 8:54:10 AM This report has been signed electronically.

## 2019-10-29 NOTE — Patient Instructions (Signed)
Thank you for allowing [place] to care for you today!  Await biopsy results of removed polyp, will make recommendation at that time for next colonoscopy.  Resume previous diet and medications today.  Return to your normal activities tomorrow.     YOU HAD AN ENDOSCOPIC PROCEDURE TODAY AT [facility]:   Refer to the procedure report that was given to you for any specific questions about what was found during the examination.  If the procedure report does not answer your questions, please call your gastroenterologist to clarify.  If you requested that your care partner not be given the details of your procedure findings, then the procedure report has been included in a sealed envelope for you to review at your convenience later.  YOU SHOULD EXPECT: Some feelings of bloating in the abdomen. Passage of more gas than usual.  Walking can help get rid of the air that was put into your GI tract during the procedure and reduce the bloating. If you had a lower endoscopy (such as a colonoscopy or flexible sigmoidoscopy) you may notice spotting of blood in your stool or on the toilet paper. If you underwent a bowel prep for your procedure, you may not have a normal bowel movement for a few days.  Please Note:  You might notice some irritation and congestion in your nose or some drainage.  This is from the oxygen used during your procedure.  There is no need for concern and it should clear up in a day or so.  SYMPTOMS TO REPORT IMMEDIATELY:   Following lower endoscopy (colonoscopy or flexible sigmoidoscopy):  Excessive amounts of blood in the stool  Significant tenderness or worsening of abdominal pains  Swelling of the abdomen that is new, acute  Fever of 100F or higher   For urgent or emergent issues, a gastroenterologist can be reached at any hour by calling [phone]. Do not use MyChart messaging for urgent concerns.    DIET:  We do recommend a small meal at first, but then you  may proceed to your regular diet.  Drink plenty of fluids but you should avoid alcoholic beverages for 24 hours.  ACTIVITY:  You should plan to take it easy for the rest of today and you should NOT DRIVE or use heavy machinery until tomorrow (because of the sedation medicines used during the test).    FOLLOW UP: Our staff will call the number listed on your records 48-72 hours following your procedure to check on you and address any questions or concerns that you may have regarding the information given to you following your procedure. If we do not reach you, we will leave a message.  We will attempt to reach you two times.  During this call, we will ask if you have developed any symptoms of COVID 19. If you develop any symptoms (ie: fever, flu-like symptoms, shortness of breath, cough etc.) before then, please call [phone].  If you test positive for Covid 19 in the 2 weeks post procedure, please call and report this information to [place].    If any biopsies were taken you will be contacted by phone or by letter within the next 1-3 weeks.  Please call us at [phone] if you have not heard about the biopsies in 3 weeks.    SIGNATURES/CONFIDENTIALITY: You and/or your care partner have signed paperwork which will be entered into your electronic medical record.  These signatures attest to the fact that that the information above on  your After Visit Summary has been reviewed and is understood.  Full responsibility of the confidentiality of this discharge information lies with you and/or your care-partner. 

## 2019-10-29 NOTE — Progress Notes (Signed)
pt tolerated well. VSS. awake and to recovery. Report given to RN.  

## 2019-10-29 NOTE — Progress Notes (Signed)
Temp JB V/s CW I have reviewed the patient's medical history in detail and updated the computerized patient record. 

## 2019-10-31 ENCOUNTER — Telehealth: Payer: Self-pay

## 2019-10-31 NOTE — Telephone Encounter (Signed)
  Follow up Call-  Call back number 10/29/2019  Post procedure Call Back phone  # [phone]  Permission to leave phone message No  Some recent data might be hidden     Patient questions:  Do you have a fever, pain , or abdominal swelling? No. Pain Score  0 *  Have you tolerated food without any problems? Yes.    Have you been able to return to your normal activities? Yes.    Do you have any questions about your discharge instructions: Diet   No. Medications  No. Follow up visit  No.  Do you have questions or concerns about your Care? No.  Actions: * If pain score is 4 or above: No action needed, pain <4. 1. Have you developed a fever since your procedure? no  2.   Have you had an respiratory symptoms (SOB or cough) since your procedure? no  3.   Have you tested positive for COVID 19 since your procedure no  4.   Have you had any family members/close contacts diagnosed with the COVID 19 since your procedure?  no   If yes to any of these questions please route to Joylene John, RN and Erenest Rasher, RN

## 2019-11-03 ENCOUNTER — Encounter: Payer: Self-pay | Admitting: Gastroenterology

## 2019-11-04 NOTE — Telephone Encounter (Signed)
Opened telephone note in error  

## 2019-11-20 DIAGNOSIS — L814 Other melanin hyperpigmentation: Secondary | ICD-10-CM | POA: Diagnosis not present

## 2019-11-20 DIAGNOSIS — C4441 Basal cell carcinoma of skin of scalp and neck: Secondary | ICD-10-CM | POA: Diagnosis not present

## 2019-11-20 DIAGNOSIS — D485 Neoplasm of uncertain behavior of skin: Secondary | ICD-10-CM | POA: Diagnosis not present

## 2019-11-20 DIAGNOSIS — L821 Other seborrheic keratosis: Secondary | ICD-10-CM | POA: Diagnosis not present

## 2019-11-20 DIAGNOSIS — L578 Other skin changes due to chronic exposure to nonionizing radiation: Secondary | ICD-10-CM | POA: Diagnosis not present

## 2019-11-20 DIAGNOSIS — L219 Seborrheic dermatitis, unspecified: Secondary | ICD-10-CM | POA: Diagnosis not present

## 2019-11-20 DIAGNOSIS — Z85828 Personal history of other malignant neoplasm of skin: Secondary | ICD-10-CM | POA: Diagnosis not present

## 2019-11-25 ENCOUNTER — Other Ambulatory Visit: Payer: Self-pay | Admitting: Gastroenterology

## 2019-11-26 ENCOUNTER — Other Ambulatory Visit: Payer: Self-pay

## 2019-11-26 ENCOUNTER — Ambulatory Visit (INDEPENDENT_AMBULATORY_CARE_PROVIDER_SITE_OTHER): Payer: PPO | Admitting: Otolaryngology

## 2019-11-26 ENCOUNTER — Encounter (INDEPENDENT_AMBULATORY_CARE_PROVIDER_SITE_OTHER): Payer: Self-pay | Admitting: Otolaryngology

## 2019-11-26 VITALS — Temp 97.7°F

## 2019-11-26 DIAGNOSIS — R040 Epistaxis: Secondary | ICD-10-CM

## 2019-11-26 DIAGNOSIS — J310 Chronic rhinitis: Secondary | ICD-10-CM

## 2019-11-26 NOTE — Progress Notes (Signed)
HPI: Jeff Lopez is a 69 y.o. male who returns today for evaluation of nasal sinus complaints.  He has had a long history of allergies and sinus problems.  He underwent sinus surgery with Dr. Janace Hoard over 3 years ago.  He states that ever since his surgery his symptoms have been worse with occasional bleeding most recently from the right side of his nose.  He uses Flonase intermittently.  He is allergic to Ceftin and cephalexin.  He had a CT scan of the sinuses performed a year ago that showed no significant sinus disease. More recently has been having frequent nosebleeds more on the right side with the nose starts bleeding he does any know it at times.  He is able to stop it with pressure.  He also complains of congestion and symptoms worse on the right side.  Past Medical History:  Diagnosis Date  . Allergy    per pt  . CAD, NATIVE VESSEL 04/19/2010   nonobstructive by cath 2007:  oLAD 20-30%, mLAD 50%, pCFX 20-30%, oAVCFX 20-30%, L renal art 50%;  normal LVF  . Cataract    bil cataracts removed  . COPD (chronic obstructive pulmonary disease) ([place])   . Diabetes mellitus without complication ([place])   . GERD 04/09/2007  . Headache(784.0)   . HYPERLIPIDEMIA 04/09/2007   Patient denies.  [name] HYPERTENSION, BENIGN 04/19/2010  . Hypothyroidism   . Hypothyroidism    previous hyperthyroidism, s/p I-131  . LUMBAR DISC DISORDER 05/27/2010  . Nerve damage    Left leg  . OSTEOARTHRITIS, LUMBAR SPINE 04/09/2007  . RENAL CYST 05/27/2010  . Spinal headache    After having back surgery in 2014   Past Surgical History:  Procedure Laterality Date  . BACK SURGERY  2012,2014  . CARDIAC CATHETERIZATION  2007   Dr Loanne Drilling  . CHOLECYSTECTOMY    . CHOLECYSTECTOMY N/A 07/13/2013   Procedure: LAPAROSCOPIC CHOLECYSTECTOMY WITH INTRAOPERATIVE CHOLANGIOGRAM;  Surgeon: Shann Medal, MD;  Location: [facility];  Service: General;  Laterality: N/A;  . COLONOSCOPY  2018  . COLONOSCOPY W/ POLYPECTOMY    . ERCP N/A  07/14/2013   Procedure: ENDOSCOPIC RETROGRADE CHOLANGIOPANCREATOGRAPHY (ERCP);  Surgeon: [name], MD;  Location: [facility];  Service: Endoscopy;  Laterality: N/A;  . EYE SURGERY Bilateral    Cataract removal  . FOOT FRACTURE SURGERY    . FRACTURE SURGERY    . LUMBAR DISC SURGERY  08/06/2012   L3 & L4  . LUMBAR DISC SURGERY  11/11/2015   L1 L2    DR NITKA  . LUMBAR FUSION N/A 04/20/2015   Procedure: T12 to L1 fusion (Extension of Previous Fusion L2-S1 to T12-S1), Right Transforaminal lumbar interbody fusion, Posterior Fusion T12 to L1, with Pedicle screws, allograft, local bone graft, and  Vivigen;  Surgeon: [name], MD;  Location: [facility];  Service: Orthopedics;  Laterality: N/A;  . LUMBAR LAMINECTOMY N/A 11/10/2013   Procedure: Left L1-2 far lateral approach to excise herniated nucleus pulposus;  Surgeon: [name], MD;  Location: [facility];  Service: Orthopedics;  Laterality: N/A;  . LUMBAR LAMINECTOMY/DECOMPRESSION MICRODISCECTOMY Left 08/10/2012   Procedure: Dura Repair Left Side L2-L3;  Surgeon: [name], MD;  Location: [facility];  Service: Orthopedics;  Laterality: Left;  Wilson Frame, Sliding table, dura repair kit, microscope  . NECK SURGERY     X 2  . SPINE SURGERY  2006   C-spine surgery x 2  . UPPER GASTROINTESTINAL ENDOSCOPY  Social History   Socioeconomic History  . Marital status: Single    Spouse name: Not on file  . Number of children: 1  . Years of education: Not on file  . Highest education level: Not on file  Occupational History  . 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 Smoking status: Former Smoker    Packs/day: 0.50    Years: 44.00    Pack years: 22.00    Types: Cigarettes    Quit date: 04/07/2007    Years since quitting: 12.6  . Smokeless tobacco: Never Used  . Tobacco comment: pt has stopped smoking about 9 months now  Substance and Sexual Activity  . Alcohol use: No  . Drug use: No  . Sexual  activity: Not Currently  Other Topics Concern  . Not on file  Social History Narrative   Works in Psychologist, educational. Retired.   Lives alone, has one child in [place].    Social Determinants of Health   Financial Resource Strain:   . Difficulty of Paying Living Expenses:   Food Insecurity:   . Worried About Charity fundraiser in the Last Year:   . Arboriculturist in the Last Year:   Transportation Needs:   . Film/video editor (Medical):   [name] Lack of Transportation (Non-Medical):   Physical Activity:   . Days of Exercise per Week:   . Minutes of Exercise per Session:   Stress:   . Feeling of Stress :   Social Connections:   . Frequency of Communication with Friends and Family:   . Frequency of Social Gatherings with Friends and Family:   . Attends Religious Services:   . Active Member of Clubs or Organizations:   . Attends Archivist Meetings:   [name] Marital Status:    Family History  Problem Relation Age of Onset  . Breast cancer Mother   . Uterine cancer Mother   . Heart disease Mother   . Heart attack Mother 37  . Prostate cancer Father   . Hypertension Brother   . Non-Hodgkin's lymphoma Brother   . Stomach cancer Paternal Grandmother   . Thyroid disease Neg Hx   . Colon cancer Neg Hx   . Esophageal cancer Neg Hx   . Rectal cancer Neg Hx   . Pancreatic cancer Neg Hx   . Colon polyps Neg Hx    Allergies  Allergen Reactions  . Ceftin Other (See Comments)    Patient stated it caused "sores in mouth" Thrush  . Cefuroxime Axetil Other (See Comments)    Sores in mouth Sores in mouth Patient stated it caused "sores in mouth" Thrush other  . Cephalexin Other (See Comments)    Other   Prior to Admission medications   Medication Sig Start Date End Date Taking? Authorizing Provider  cholecalciferol (VITAMIN D3) 25 MCG (1000 UT) tablet Take 1,000 Units by mouth daily.   Yes [provider]  irbesartan (AVAPRO) 300 MG tablet TAKE 1 TABLET BY  MOUTH EVERY DAY 07/29/19  Yes Sherren Mocha, MD  levothyroxine (SYNTHROID) 125 MCG tablet TAKE 1 TABLET BY MOUTH EVERY DAY BEFORE BREAKFAST 06/26/19  Yes Koberlein, Junell C, MD  metFORMIN (GLUCOPHAGE-XR) 500 MG 24 hr tablet TAKE 1 TABLET BY MOUTH EVERY DAY WITH BREAKFAST 10/24/19  Yes Koberlein, Junell C, MD  metoprolol succinate (TOPROL-XL) 50 MG 24 hr tablet TAKE 1 TABLET BY MOUTH EVERY DAY 07/29/19  Yes Sherren Mocha, MD  pantoprazole (PROTONIX) 40 MG tablet TAKE 1 TABLET BY MOUTH 2 TIMES DAILY 11/25/19  Yes [name], MD     Positive ROS: Otherwise negative  All other systems have been reviewed and were otherwise negative with the exception of those mentioned in the HPI and as above.  Physical Exam: Constitutional: Alert, well-appearing, no acute distress. Ears: External ears without lesions or tenderness. Ear canals are clear bilaterally with intact, clear TMs.  Nasal: External nose without lesions.  Patient has moderate congestion on both sides.  After decongesting the nose the middle meatus regions were clear bilaterally with no obvious mucopurulent discharge noted no polyps noted.  When he blew his nose hard he developed bleeding from the right side of septum that was cauterized in the office with silver nitrate.  This apparently has been the only side that has been bleeding. Oral: Lips and gums without lesions. Tongue and palate mucosa without lesions. Posterior oropharynx clear. Neck: No palpable adenopathy or masses Respiratory: Breathing comfortably  Skin: No facial/neck lesions or rash noted.  Control of epistaxis  Date/Time: 11/26/2019 2:41 PM Performed by: Rozetta Nunnery, MD Authorized by: Rozetta Nunnery, MD   Consent:    Consent obtained:  Verbal   Consent given by:  Patient   Risks discussed:  Bleeding and pain   Alternatives discussed:  No treatment and observation Anesthesia:    Anesthesia method:  None Procedure details:    Treatment site:  R  septum   Treatment method:  Oxymetazoline and silver nitrate   Treatment complexity:  Limited   Treatment episode: initial   Post-procedure details:    Assessment:  Bleeding stopped   Patient tolerance of procedure:  Tolerated well, no immediate complications Comments:     Patient had bleeding from the anterior mid septum on the right side.  This was stopped with silver nitrate.  In this region the nose is fairly narrow.  Remaining nasal cavity was clear.    Assessment: Recurrent right-sided nosebleeds from septal vessel that was cauterized in the office with silver nitrate. Chronic rhinitis  Plan: Recommend use of Nasacort 2 sprays each nostril at night and wait to start this in 2 or 3 days. He will follow-up as needed any further nosebleeds. Also discussed use of nasal gel spray if he has any dryness or crusting in the nose which he has sometimes. Can also use saline nasal rinses.   Radene Journey, MD

## 2019-12-04 ENCOUNTER — Ambulatory Visit: Payer: PPO | Admitting: Pulmonary Disease

## 2019-12-15 ENCOUNTER — Other Ambulatory Visit: Payer: Self-pay | Admitting: Pulmonary Disease

## 2019-12-15 NOTE — Telephone Encounter (Signed)
Dr. O, please advise if you are okay refilling med for pt. 

## 2019-12-29 ENCOUNTER — Other Ambulatory Visit: Payer: Self-pay | Admitting: Family Medicine

## 2019-12-29 DIAGNOSIS — E1169 Type 2 diabetes mellitus with other specified complication: Secondary | ICD-10-CM

## 2020-01-08 ENCOUNTER — Other Ambulatory Visit (HOSPITAL_COMMUNITY): Payer: Self-pay | Admitting: Pulmonary Disease

## 2020-01-08 ENCOUNTER — Telehealth: Payer: Self-pay | Admitting: Pulmonary Disease

## 2020-01-08 MED ORDER — ESZOPICLONE 3 MG PO TABS
3.0000 mg | ORAL_TABLET | Freq: Every evening | ORAL | 0 refills | Status: DC | PRN
Start: 2020-01-08 — End: 2020-01-28

## 2020-01-08 NOTE — Telephone Encounter (Signed)
Dose increased to 3 mg sent to pharmacy

## 2020-01-08 NOTE — Telephone Encounter (Signed)
Patient's pharmacy requesting a dose increase on Eszpiclone. You ordered 2 mg at bedtime and they want to increase it to 3 mg at bedtime, 30 day supply. [facility]. Please refill/advise.

## 2020-01-09 NOTE — Addendum Note (Signed)
Addended by: Marrion Coy on: 01/09/2020 04:45 PM   Modules accepted: Orders

## 2020-01-19 ENCOUNTER — Other Ambulatory Visit: Payer: Self-pay

## 2020-01-19 ENCOUNTER — Other Ambulatory Visit: Payer: PPO

## 2020-01-19 ENCOUNTER — Ambulatory Visit: Payer: PPO | Admitting: Pulmonary Disease

## 2020-01-19 DIAGNOSIS — E890 Postprocedural hypothyroidism: Secondary | ICD-10-CM

## 2020-01-19 DIAGNOSIS — I10 Essential (primary) hypertension: Secondary | ICD-10-CM

## 2020-01-19 DIAGNOSIS — E559 Vitamin D deficiency, unspecified: Secondary | ICD-10-CM | POA: Diagnosis not present

## 2020-01-19 DIAGNOSIS — E785 Hyperlipidemia, unspecified: Secondary | ICD-10-CM | POA: Diagnosis not present

## 2020-01-19 DIAGNOSIS — E1169 Type 2 diabetes mellitus with other specified complication: Secondary | ICD-10-CM

## 2020-01-19 DIAGNOSIS — E1143 Type 2 diabetes mellitus with diabetic autonomic (poly)neuropathy: Secondary | ICD-10-CM | POA: Diagnosis not present

## 2020-01-20 LAB — HEMOGLOBIN A1C
Hgb A1c MFr Bld: 7 % of total Hgb — ABNORMAL HIGH (ref <5.7)
Mean Plasma Glucose: 154 (calc)
eAG (mmol/L): 8.5 (calc)

## 2020-01-20 LAB — CBC WITH DIFFERENTIAL/PLATELET
Absolute Monocytes: 465 cells/uL (ref 200–950)
Basophils Absolute: 30 cells/uL (ref 0–200)
Basophils Relative: 0.6 %
Eosinophils Absolute: 160 cells/uL (ref 15–500)
Eosinophils Relative: 3.2 %
HCT: 44.9 % (ref 38.5–50.0)
Hemoglobin: 15.4 g/dL (ref 13.2–17.1)
Lymphs Abs: 1275 cells/uL (ref 850–3900)
MCH: 32.9 pg (ref 27.0–33.0)
MCHC: 34.3 g/dL (ref 32.0–36.0)
MCV: 95.9 fL (ref 80.0–100.0)
MPV: 11.4 fL (ref 7.5–12.5)
Monocytes Relative: 9.3 %
Neutro Abs: 3070 cells/uL (ref 1500–7800)
Neutrophils Relative %: 61.4 %
Platelets: 183 10*3/uL (ref 140–400)
RBC: 4.68 10*6/uL (ref 4.20–5.80)
RDW: 11.8 % (ref 11.0–15.0)
Total Lymphocyte: 25.5 %
WBC: 5 10*3/uL (ref 3.8–10.8)

## 2020-01-20 LAB — LIPID PANEL
Cholesterol: 135 mg/dL (ref <200)
HDL: 35 mg/dL — ABNORMAL LOW (ref >=40)
LDL Cholesterol (Calc): 75 mg/dL (calc)
Non-HDL Cholesterol (Calc): 100 mg/dL (calc) (ref <130)
Total CHOL/HDL Ratio: 3.9 (calc) (ref <5.0)
Triglycerides: 149 mg/dL (ref <150)

## 2020-01-20 LAB — TSH: TSH: 3.84 mIU/L (ref 0.40–4.50)

## 2020-01-20 LAB — COMPREHENSIVE METABOLIC PANEL
AG Ratio: 1.9 (calc) (ref 1.0–2.5)
ALT: 21 U/L (ref 9–46)
AST: 19 U/L (ref 10–35)
Albumin: 4.3 g/dL (ref 3.6–5.1)
Alkaline phosphatase (APISO): 76 U/L (ref 35–144)
BUN: 21 mg/dL (ref 7–25)
CO2: 25 mmol/L (ref 20–32)
Calcium: 9.2 mg/dL (ref 8.6–10.3)
Chloride: 104 mmol/L (ref 98–110)
Creat: 1.11 mg/dL (ref 0.70–1.25)
Globulin: 2.3 g/dL (calc) (ref 1.9–3.7)
Glucose, Bld: 144 mg/dL — ABNORMAL HIGH (ref 65–99)
Potassium: 3.9 mmol/L (ref 3.5–5.3)
Sodium: 139 mmol/L (ref 135–146)
Total Bilirubin: 1.9 mg/dL — ABNORMAL HIGH (ref 0.2–1.2)
Total Protein: 6.6 g/dL (ref 6.1–8.1)

## 2020-01-20 LAB — IRON,TIBC AND FERRITIN PANEL
Ferritin: 265 ng/mL (ref 24–380)
Iron: 180 ug/dL (ref 50–180)

## 2020-01-20 LAB — VITAMIN D 25 HYDROXY (VIT D DEFICIENCY, FRACTURES): Vit D, 25-Hydroxy: 36 ng/mL (ref 30–100)

## 2020-01-28 ENCOUNTER — Ambulatory Visit (INDEPENDENT_AMBULATORY_CARE_PROVIDER_SITE_OTHER): Payer: PPO | Admitting: Family Medicine

## 2020-01-28 ENCOUNTER — Other Ambulatory Visit: Payer: Self-pay

## 2020-01-28 ENCOUNTER — Encounter: Payer: Self-pay | Admitting: Family Medicine

## 2020-01-28 VITALS — BP 132/80 | HR 55 | Temp 98.3°F | Ht 71.0 in | Wt 188.8 lb

## 2020-01-28 DIAGNOSIS — I10 Essential (primary) hypertension: Secondary | ICD-10-CM | POA: Diagnosis not present

## 2020-01-28 DIAGNOSIS — G629 Polyneuropathy, unspecified: Secondary | ICD-10-CM | POA: Diagnosis not present

## 2020-01-28 DIAGNOSIS — E538 Deficiency of other specified B group vitamins: Secondary | ICD-10-CM | POA: Diagnosis not present

## 2020-01-28 DIAGNOSIS — E890 Postprocedural hypothyroidism: Secondary | ICD-10-CM

## 2020-01-28 DIAGNOSIS — E785 Hyperlipidemia, unspecified: Secondary | ICD-10-CM | POA: Diagnosis not present

## 2020-01-28 DIAGNOSIS — E1169 Type 2 diabetes mellitus with other specified complication: Secondary | ICD-10-CM

## 2020-01-28 DIAGNOSIS — Z87891 Personal history of nicotine dependence: Secondary | ICD-10-CM

## 2020-01-28 DIAGNOSIS — R17 Unspecified jaundice: Secondary | ICD-10-CM

## 2020-01-28 MED ORDER — LEVOTHYROXINE SODIUM 137 MCG PO TABS: 137.0000 ug | ORAL_TABLET | Freq: Every day | ORAL | 1 refills | Status: DC

## 2020-01-28 MED ORDER — ESZOPICLONE 3 MG PO TABS: 3.0000 mg | ORAL_TABLET | Freq: Every evening | ORAL | 1 refills | Status: DC | PRN

## 2020-01-28 NOTE — Patient Instructions (Signed)
Consider [facility] program to help with blood sugars.

## 2020-01-28 NOTE — Progress Notes (Signed)
Jeff Lopez DOB: 23-Nov-1950 Encounter date: 01/28/2020  This is a 69 y.o. male who presents with Chief Complaint  Patient presents with   Follow-up    History of present illness: Diabetes, non-insulin-dependent: At last visit, he was not being compliant with diabetic diet.  A1c improved from January to July from 7.5 down to 7. Hasn't drank soda (or just very little); cut out baked potato. Has been doing this for 3 months. Also walking 4 miles/day. Plans to keep up with these healthier behavior and hoping for continued improvement. Doesn't like being on the metformin. Drinking a ton of water.   Takes medications every morning. When he got up this morning felt sluggish, not well. 20 minutes after taking medication he feels energized and ready to go. Wonders if related to thyroid medication.   Taking vitamin d now for a couple of years.   Friend having some prostate issues. Wondering about his PSA level.   Has lost weight, but feels that still has abdominal weight. Eating brussel sprouts, salad, non starchy vegetables.   *Hypertension: irbesartan, toprol. *Hyperlipidemia: does not want to be on statin *Hypothyroid: synthroid 182mcg daily  Sometimes feeling numbness/pins/needles in different areas - hands.    Allergies  Allergen Reactions   Ceftin Other (See Comments)    Patient stated it caused "sores in mouth" Thrush   Cefuroxime Axetil Other (See Comments)    Sores in mouth Sores in mouth Patient stated it caused "sores in mouth" Thrush other   Cephalexin Other (See Comments)    Other   Current Meds  Medication Sig   cholecalciferol (VITAMIN D3) 25 MCG (1000 UT) tablet Take 1,000 Units by mouth daily.   Eszopiclone (ESZOPICLONE) 3 MG TABS Take 1 tablet (3 mg total) by mouth at bedtime as needed. Take immediately before bedtime   irbesartan (AVAPRO) 300 MG tablet TAKE 1 TABLET BY MOUTH EVERY DAY   metFORMIN (GLUCOPHAGE-XR) 500 MG 24 hr tablet TAKE 1 TABLET BY  MOUTH EVERY DAY WITH BREAKFAST   metoprolol succinate (TOPROL-XL) 50 MG 24 hr tablet TAKE 1 TABLET BY MOUTH EVERY DAY   pantoprazole (PROTONIX) 40 MG tablet TAKE 1 TABLET BY MOUTH 2 TIMES DAILY   [DISCONTINUED] levothyroxine (SYNTHROID) 125 MCG tablet TAKE 1 TABLET BY MOUTH EVERY DAY BEFORE BREAKFAST    Review of Systems  Constitutional: Negative for chills, fatigue and fever.  Respiratory: Negative for cough, chest tightness, shortness of breath and wheezing.   Cardiovascular: Negative for chest pain, palpitations and leg swelling.    Objective:  BP 132/80 (BP Location: Left Arm, Patient Position: Sitting, Cuff Size: Normal)    Pulse (!) 55    Temp 98.3 F (36.8 C) (Oral)    Ht 5\' 11"  (1.803 m)    Wt 188 lb 12.8 oz (85.6 kg)    BMI 26.33 kg/m   Weight: 188 lb 12.8 oz (85.6 kg)   BP Readings from Last 3 Encounters:  01/28/20 132/80  10/29/19 (!) 98/58  07/30/19 128/84   Wt Readings from Last 3 Encounters:  01/28/20 188 lb 12.8 oz (85.6 kg)  10/29/19 198 lb (89.8 kg)  09/16/19 198 lb (89.8 kg)    Physical Exam Constitutional:      General: He is not in acute distress.    Appearance: He is well-developed.  HENT:     Head: Normocephalic and atraumatic.  Cardiovascular:     Rate and Rhythm: Normal rate and regular rhythm.     Heart sounds: Normal heart sounds.  No murmur heard.   Pulmonary:     Effort: Pulmonary effort is normal.     Breath sounds: Normal breath sounds.  Abdominal:     General: Bowel sounds are normal. There is no distension.     Palpations: Abdomen is soft.     Tenderness: There is no abdominal tenderness. There is no guarding.  Skin:    General: Skin is warm and dry.     Comments: Sensory exam of the foot is normal, tested with the monofilament. Good pulses, no lesions or ulcers, good peripheral pulses.  Psychiatric:        Judgment: Judgment normal.     Assessment/Plan  1. HYPERTENSION, BENIGN Well-controlled.  Continue current medications.   Suspect this will continue to improve with weight loss and healthy eating.  2. Hyperlipidemia associated with type 2 diabetes mellitus ([place]) He is done a great job with healthy dietary changes.  No longer eating red meat.  Can recheck in 6 months time. - Hemoglobin A1c; Future - Comprehensive metabolic panel; Future - HM DIABETES FOOT EXAM  3. Hypothyroidism following radioiodine therapy Feeling slightly sluggish in the morning.  Wearing to make small dose increase and levothyroxine to move TSH into lower normal range.  He does take this with other medications, but has always done this and tolerates well, feels he would be unable to be compliant if he took it differently. - levothyroxine (SYNTHROID) 137 MCG tablet; Take 1 tablet (137 mcg total) by mouth daily before breakfast.  Dispense: 90 tablet; Refill: 1 - TSH; Future  4. Elevated bilirubin We will plan to recheck in 3 months time.  He does have some fluctuation with this historically. - Iron, TIBC and Ferritin Panel; Future  5. Neuropathy We discussed doing further lab work and discuss causes for neuropathy.  May be related to blood sugars.  Reassured that circulation is good.  6. B12 deficiency Has been low normal in the past.  Intermittently has taken vitamin B12. - Vitamin B12; Future  7. Smoking history We will order low-dose CT for further evaluation.   Time spent with patient in room, addressing medical conditions, reviewing dietary plan and medication options, charting 49 minutes.  Return in about 3 months (around 04/29/2020) for Chronic condition visit; labs prior.  Micheline Rough, MD

## 2020-02-12 DIAGNOSIS — C4441 Basal cell carcinoma of skin of scalp and neck: Secondary | ICD-10-CM | POA: Diagnosis not present

## 2020-02-16 ENCOUNTER — Other Ambulatory Visit: Payer: Self-pay | Admitting: *Deleted

## 2020-02-16 DIAGNOSIS — Z87891 Personal history of nicotine dependence: Secondary | ICD-10-CM

## 2020-03-10 ENCOUNTER — Other Ambulatory Visit: Payer: Self-pay

## 2020-03-10 ENCOUNTER — Ambulatory Visit (INDEPENDENT_AMBULATORY_CARE_PROVIDER_SITE_OTHER)
Admission: RE | Admit: 2020-03-10 | Discharge: 2020-03-10 | Disposition: A | Discharge status: Home / Self Care | Payer: PPO | Source: Ambulatory Visit | Attending: Acute Care | Admitting: Acute Care

## 2020-03-10 ENCOUNTER — Encounter: Payer: Self-pay | Admitting: Acute Care

## 2020-03-10 ENCOUNTER — Ambulatory Visit (INDEPENDENT_AMBULATORY_CARE_PROVIDER_SITE_OTHER): Payer: PPO | Admitting: Acute Care

## 2020-03-10 VITALS — BP 120/70 | HR 71 | Temp 97.5°F | Ht 71.0 in | Wt 188.0 lb

## 2020-03-10 DIAGNOSIS — Z122 Encounter for screening for malignant neoplasm of respiratory organs: Secondary | ICD-10-CM

## 2020-03-10 DIAGNOSIS — Z87891 Personal history of nicotine dependence: Secondary | ICD-10-CM

## 2020-03-10 NOTE — Progress Notes (Signed)
Shared Decision Making Visit Lung Cancer Screening Program [phone])   Eligibility:  Age 69 y.o.  Pack Years Smoking History Calculation 37 pack year smoking history (# packs/per year x # years smoked)  Recent History of coughing up blood  no  Unexplained weight loss? no ( >Than 15 pounds within the last 6 months )  Prior History Lung / other cancer no (Diagnosis within the last 5 years already requiring surveillance chest CT Scans).  Smoking Status Former Smoker  Former Smokers: Years since quit: 12 years  Quit Date: 2009  Visit Components:  Discussion included one or more decision making aids. yes  Discussion included risk/benefits of screening. yes  Discussion included potential follow up diagnostic testing for abnormal scans. yes  Discussion included meaning and risk of over diagnosis. yes  Discussion included meaning and risk of False Positives. yes  Discussion included meaning of total radiation exposure. yes  Counseling Included:  Importance of adherence to annual lung cancer LDCT screening. yes  Impact of comorbidities on ability to participate in the program. yes  Ability and willingness to under diagnostic treatment. yes  Smoking Cessation Counseling:  Current Smokers:   Discussed importance of smoking cessation. yes  Information about tobacco cessation classes and interventions provided to patient. yes  Patient provided with "ticket" for LDCT Scan. yes Symptomatic Patient. No  Counseling NA  Diagnosis Code: Tobacco Use Z72.0  Asymptomatic Patient yes  Counseling (Intermediate counseling: > three minutes counseling) U0454  Former Smokers:   Discussed the importance of maintaining cigarette abstinence. yes  Diagnosis Code: Personal History of Nicotine Dependence. U98.119  Information about tobacco cessation classes and interventions provided to patient. Yes  Patient provided with "ticket" for LDCT Scan. yes  Written Order for Lung Cancer  Screening with LDCT placed in Epic. Yes (CT Chest Lung Cancer Screening Low Dose W/O CM) [id number] Z12.2-Screening of respiratory organs Z87.891-Personal history of nicotine dependence  BP 120/70 (BP Location: Left Arm, Cuff Size: Normal)   Pulse 71   Temp (!) 97.5 F (36.4 C) (Oral)   Ht 5\' 11"  (1.803 m)   Wt 188 lb (85.3 kg)   SpO2 97%   BMI 26.22 kg/m    I spent 25 minutes of face to face time with Mr. Stockhausen discussing the risks and benefits of lung cancer screening. We viewed a power point together that explained in detail the above noted topics. We took the time to pause the power point at intervals to allow for questions to be asked and answered to ensure understanding. We discussed that he had taken the single most powerful action possible to decrease his risk of developing lung cancer when he quit smoking. I counseled him to remain smoke free, and to contact me if he ever had the desire to smoke again so that I can provide resources and tools to help support the effort to remain smoke free. We discussed the time and location of the scan, and that either  Doroteo Glassman RN or I will call with the results within  24-48 hours of receiving them. He has my card and contact information in the event he needs to speak with me, in addition to a copy of the power point we reviewed as a resource. He verbalized understanding of all of the above and had no further questions upon leaving the office.     I explained to the patient that there has been a high incidence of coronary artery disease noted on these exams. I explained that  this is a non-gated exam therefore degree or severity cannot be determined. This patient is not on statin therapy. I have asked the patient to follow-up with their PCP regarding any incidental finding of coronary artery disease and management with diet or medication as they feel is clinically indicated. The patient verbalized understanding of the above and had no further  questions.     Magdalen Spatz, NP 03/10/2020 9:45 PM

## 2020-03-10 NOTE — Patient Instructions (Signed)
Thank you for participating in the [facility]. It was our pleasure to meet you today. We will call you with the results of your scan within the next few days. Your scan will be assigned a Lung RADS category score by the physicians reading the scans.  This Lung RADS score determines follow up scanning.  See below for description of categories, and follow up screening recommendations. We will be in touch to schedule your follow up screening annually or based on recommendations of our providers. We will fax a copy of your scan results to your Primary Care Physician, or the physician who referred you to the program, to ensure they have the results. Please call the office if you have any questions or concerns regarding your scanning experience or results.  Our office number is [phone]. Please speak with Denise Phelps, RN. She is our Lung Cancer Screening RN. If she is unavailable when you call, please have the office staff send her a message. She will return your call at her earliest convenience. Remember, if your scan is normal, we will scan you annually as long as you continue to meet the criteria for the program. (Age 55-77, Current smoker or smoker who has quit within the last 15 years). If you are a smoker, remember, quitting is the single most powerful action that you can take to decrease your risk of lung cancer and other pulmonary, breathing related problems. We know quitting is hard, and we are here to help.  Please let us know if there is anything we can do to help you meet your goal of quitting. If you are a former smoker, congratulations. We are proud of you! Remain smoke free! Remember you can refer friends or family members through the number above.  We will screen them to make sure they meet criteria for the program. Thank you for helping us take better care of you by participating in Lung Screening.  Lung RADS Categories:  Lung RADS 1: no nodules  or definitely non-concerning nodules.  Recommendation is for a repeat annual scan in 12 months.  Lung RADS 2:  nodules that are non-concerning in appearance and behavior with a very low likelihood of becoming an active cancer. Recommendation is for a repeat annual scan in 12 months.  Lung RADS 3: nodules that are probably non-concerning , includes nodules with a low likelihood of becoming an active cancer.  Recommendation is for a 6-month repeat screening scan. Often noted after an upper respiratory illness. We will be in touch to make sure you have no questions, and to schedule your 6-month scan.  Lung RADS 4 A: nodules with concerning findings, recommendation is most often for a follow up scan in 3 months or additional testing based on our provider's assessment of the scan. We will be in touch to make sure you have no questions and to schedule the recommended 3 month follow up scan.  Lung RADS 4 B:  indicates findings that are concerning. We will be in touch with you to schedule additional diagnostic testing based on our provider's  assessment of the scan.   

## 2020-03-16 NOTE — Progress Notes (Signed)
Please call patient and let them  know their  low dose Ct was read as a Lung RADS 2: nodules that are benign in appearance and behavior with a very low likelihood of becoming a clinically active cancer due to size or lack of growth. Recommendation per radiology is for a repeat LDCT in 12 months. .Please let them  know we will order and schedule their  annual screening scan for 02/2021. Please let them  know there was notation of CAD on their  scan.  Please remind the patient  that this is a non-gated exam therefore degree or severity of disease  cannot be determined. Please have them  follow up with their PCP regarding potential risk factor modification, dietary therapy or pharmacologic therapy if clinically indicated. °Pt.  is  not currently on statin therapy. Please place order for annual  screening scan for  02/2021 and fax results to PCP. Thanks so much. °

## 2020-03-17 ENCOUNTER — Telehealth: Payer: Self-pay | Admitting: Acute Care

## 2020-03-17 DIAGNOSIS — Z87891 Personal history of nicotine dependence: Secondary | ICD-10-CM

## 2020-03-19 NOTE — Telephone Encounter (Signed)
Pt informed of CT results per Sarah Groce, NP.  PT verbalized understanding.  Copy sent to PCP.  Order placed for 1 yr f/u CT.  

## 2020-03-24 DIAGNOSIS — C4491 Basal cell carcinoma of skin, unspecified: Secondary | ICD-10-CM | POA: Diagnosis not present

## 2020-03-24 DIAGNOSIS — L219 Seborrheic dermatitis, unspecified: Secondary | ICD-10-CM | POA: Diagnosis not present

## 2020-03-25 ENCOUNTER — Telehealth: Payer: PPO

## 2020-04-15 ENCOUNTER — Ambulatory Visit: Payer: Self-pay

## 2020-04-15 ENCOUNTER — Encounter: Payer: Self-pay | Admitting: Surgery

## 2020-04-15 ENCOUNTER — Ambulatory Visit: Payer: PPO | Admitting: Surgery

## 2020-04-15 VITALS — BP 128/79 | HR 66 | Ht 71.0 in | Wt 188.0 lb

## 2020-04-15 DIAGNOSIS — Z981 Arthrodesis status: Secondary | ICD-10-CM | POA: Diagnosis not present

## 2020-04-15 DIAGNOSIS — M533 Sacrococcygeal disorders, not elsewhere classified: Secondary | ICD-10-CM | POA: Diagnosis not present

## 2020-04-15 NOTE — Progress Notes (Signed)
Office Visit Note   Patient: Jeff Lopez           Date of Birth: 1951/02/19           MRN: [id number] Visit Date: 04/15/2020              Requested by: Caren Macadam, MD [place],  [place] [id number] PCP: Caren Macadam, MD   Assessment & Plan: Visit Diagnoses:  1. Status post lumbar spinal fusion   2. Sacroiliac joint dysfunction of left side     Plan: Since patient is localizing most of his pain to the left SI joint I asked Dr. Junius Roads to perform a ultrasound-guided SI joint injection.  Advised patient to close attention to how he feels after this is performed.  Follow-up with Dr. Louanne Skye in 3 weeks for recheck and if he is not doing any better will see Dr. Louanne Skye wants to order a CT lumbar spine.  Follow-Up Instructions: Return in about 3 weeks (around 05/06/2020) for WITH DR [place] RECHECK AFTER SI JOINT INJECTION.  QUESTION NEEDING CT SCAN LUMBAR.   Orders:  Orders Placed This Encounter  Procedures  . XR Lumbar Spine 2-3 Views  . US Guided Needle Placement   No orders of the defined types were placed in this encounter.     Procedures: No procedures performed   Clinical Data: No additional findings.   Subjective: Chief Complaint  Patient presents with  . Lower Back - Pain    HPI 69 year old white male comes in today with complaints of low back pain.  Over the years he has had instrumented fusion T12-S1.  States that he had increased back pain for about a week or so.  Localizes most of his pain to the left SI joint.  Pain when he is ambulating, standing, bending.  States that he can walk a mile without any issues.  He was previously walking 3 miles per day but not anymore since recent flareup of his symptoms.  No lower extremity numbness tingling.  He has intermittent episodes of pain shooting down his left leg.  Last seen by Dr. Louanne Skye May 2020. Review of Systems No current cardiac pulmonary GI GU issues  Objective: Vital  Signs: BP 128/79   Pulse 66   Ht _0  (1.803 m)   Wt 188 lb (85.3 kg)   BMI 26.22 kg/m   Physical Exam HENT:     Head: Normocephalic and atraumatic.     Nose: Nose normal.  Pulmonary:     Effort: No respiratory distress.  Musculoskeletal:     Comments: Gait is normal.  Patient has marked tenderness over the left SI joint.  Nontender over the right side.  Negative logroll.  Negative straight leg raise.  No focal motor deficits.  Neurological:     General: No focal deficit present.     Mental Status: He is alert and oriented to person, place, and time.     Ortho Exam  Specialty Comments:  No specialty comments available.  Imaging: No results found.   PMFS History: Patient Active Problem List   Diagnosis Date Noted  . OSA (obstructive sleep apnea) 11/25/2018  . Non-restorative sleep 11/25/2018  . Malignant neoplasm of ascending colon ([place]) 05/16/2018  . Low testosterone 03/02/2018  . Gynecomastia 02/28/2018  . Left lower quadrant pain 12/26/2017  . Arthralgia 10/03/2017  . Numbness 10/03/2017  . Chronic pansinusitis 01/09/2017  . Deviated septum 01/09/2017  . Chronic left sacroiliac  pain 10/19/2016  . Chronic left-sided low back pain with left-sided sciatica 07/31/2016  . Spinal stenosis, lumbar region, with neurogenic claudication 04/21/2015    Class: Chronic  . Spondylolisthesis of lumbar region 04/21/2015    Class: Chronic  . Hypothyroidism following radioiodine therapy 03/10/2015  . Screening for prostate cancer 03/10/2015  . Hemochromatosis 03/27/2014  . Choledocholithiasis 07/14/2013  . Nonspecific (abnormal) findings on radiological and other examination of biliary tract 07/13/2013  . Calculus of gallbladder with acute cholecystitis, without mention of obstruction 07/13/2013  . Calculus of bile duct without mention of cholecystitis or obstruction 07/13/2013  . Abdominal pain 07/12/2013  . Neck pain on left side 05/30/2013  . Quit smoking 11/28/2012  .  Postoperative CSF leak 08/10/2012    Class: Acute  . Degenerative disc disease, lumbar 08/06/2012    Class: Chronic  . HNP (herniated nucleus pulposus), lumbar 08/06/2012    Class: Acute  . Type II diabetes mellitus with peripheral autonomic neuropathy ([place]) 06/10/2012  . COPD GOLD 0 02/09/2012  . Chest pain at rest 02/06/2012  . Allergic rhinitis 02/06/2012  . Radiculopathy of leg 11/05/2010  . RENAL CYST 05/27/2010  . LUMBAR DISC DISORDER 05/27/2010  . HYPERTENSION, BENIGN 04/19/2010  . CAD, NATIVE VESSEL 04/19/2010  . Hyperlipidemia associated with type 2 diabetes mellitus ([place]) 04/09/2007  . GERD 04/09/2007  . OSTEOARTHRITIS, LUMBAR SPINE 04/09/2007   Past Medical History:  Diagnosis Date  . Allergy    per pt  . CAD, NATIVE VESSEL 04/19/2010   nonobstructive by cath 2007:  oLAD 20-30%, mLAD 50%, pCFX 20-30%, oAVCFX 20-30%, L renal art 50%;  normal LVF  . Cataract    bil cataracts removed  . COPD (chronic obstructive pulmonary disease) ([place])   . Diabetes mellitus without complication ([place])   . GERD 04/09/2007  . Headache(784.0)   . HYPERLIPIDEMIA 04/09/2007   Patient denies.  [name] HYPERTENSION, BENIGN 04/19/2010  . Hypothyroidism   . Hypothyroidism    previous hyperthyroidism, s/p I-131  . LUMBAR DISC DISORDER 05/27/2010  . Nerve damage    Left leg  . OSTEOARTHRITIS, LUMBAR SPINE 04/09/2007  . RENAL CYST 05/27/2010  . Spinal headache    After having back surgery in 2014    Family History  Problem Relation Age of Onset  . Breast cancer Mother   . Uterine cancer Mother   . Heart disease Mother   . Heart attack Mother 57  . Prostate cancer Father   . Hypertension Brother   . Non-Hodgkin's lymphoma Brother   . Stomach cancer Paternal Grandmother   . Thyroid disease Neg Hx   . Colon cancer Neg Hx   . Esophageal cancer Neg Hx   . Rectal cancer Neg Hx   . Pancreatic cancer Neg Hx   . Colon polyps Neg Hx     Past Surgical History:  Procedure Laterality Date  .  BACK SURGERY  2012,2014  . CARDIAC CATHETERIZATION  2007   Dr Loanne Drilling  . CHOLECYSTECTOMY    . CHOLECYSTECTOMY N/A 07/13/2013   Procedure: LAPAROSCOPIC CHOLECYSTECTOMY WITH INTRAOPERATIVE CHOLANGIOGRAM;  Surgeon: Shann Medal, MD;  Location: [facility];  Service: General;  Laterality: N/A;  . COLONOSCOPY  2018  . COLONOSCOPY W/ POLYPECTOMY    . ERCP N/A 07/14/2013   Procedure: ENDOSCOPIC RETROGRADE CHOLANGIOPANCREATOGRAPHY (ERCP);  Surgeon: [name], MD;  Location: [facility];  Service: Endoscopy;  Laterality: N/A;  . EYE SURGERY Bilateral    Cataract removal  . FOOT FRACTURE SURGERY    .  FRACTURE SURGERY    . LUMBAR DISC SURGERY  08/06/2012   L3 & L4  . LUMBAR DISC SURGERY  11/11/2015   L1 L2    DR NITKA  . LUMBAR FUSION N/A 04/20/2015   Procedure: T12 to L1 fusion (Extension of Previous Fusion L2-S1 to T12-S1), Right Transforaminal lumbar interbody fusion, Posterior Fusion T12 to L1, with Pedicle screws, allograft, local bone graft, and  Vivigen;  Surgeon: [name], MD;  Location: [facility];  Service: Orthopedics;  Laterality: N/A;  . LUMBAR LAMINECTOMY N/A 11/10/2013   Procedure: Left L1-2 far lateral approach to excise herniated nucleus pulposus;  Surgeon: [name], MD;  Location: [facility];  Service: Orthopedics;  Laterality: N/A;  . LUMBAR LAMINECTOMY/DECOMPRESSION MICRODISCECTOMY Left 08/10/2012   Procedure: Dura Repair Left Side L2-L3;  Surgeon: [name], MD;  Location: [facility];  Service: Orthopedics;  Laterality: Left;  Wilson Frame, Sliding table, dura repair kit, microscope  . NECK SURGERY     X 2  . SPINE SURGERY  2006   C-spine surgery x 2  . UPPER GASTROINTESTINAL ENDOSCOPY     Social History   Occupational History  . 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 Smoking status: Former Smoker    Packs/day: 1.00    Years: 37.00    Pack years: 37.00    Types: Cigarettes    Quit date: 04/06/2008    Years since quitting: 12.0   . Smokeless tobacco: Never Used  . Tobacco comment: pt has stopped smoking about 9 months now  Vaping Use  . Vaping Use: Never used  Substance and Sexual Activity  . Alcohol use: No  . Drug use: No  . Sexual activity: Not Currently

## 2020-04-15 NOTE — Progress Notes (Signed)
Subjective: Patient is here for ultrasound-guided intra-articular left SI joint injection.   Pain status-post multi-level fusion.  Objective:  Tender over left SI joint.  Procedure: Ultrasound-guided left SI injection: After sterile prep with Betadine, injected 5 cc 1% lidocaine without epinephrine and 40 mg methylprednisolone using a 22-gauge spinal needle, passing the needle through the sacroiliac ligament into the region of the SI joint.  Modest improvement during anesthetic phase.

## 2020-04-16 NOTE — Addendum Note (Signed)
Addended by: Marrion Coy on: 04/16/2020 01:30 PM   Modules accepted: Orders

## 2020-04-17 ENCOUNTER — Other Ambulatory Visit: Payer: Self-pay | Admitting: Family Medicine

## 2020-04-17 DIAGNOSIS — E1169 Type 2 diabetes mellitus with other specified complication: Secondary | ICD-10-CM

## 2020-04-23 ENCOUNTER — Other Ambulatory Visit: Payer: Self-pay

## 2020-04-23 ENCOUNTER — Other Ambulatory Visit: Payer: PPO

## 2020-04-23 DIAGNOSIS — E890 Postprocedural hypothyroidism: Secondary | ICD-10-CM | POA: Diagnosis not present

## 2020-04-23 DIAGNOSIS — R17 Unspecified jaundice: Secondary | ICD-10-CM | POA: Diagnosis not present

## 2020-04-23 DIAGNOSIS — E785 Hyperlipidemia, unspecified: Secondary | ICD-10-CM | POA: Diagnosis not present

## 2020-04-23 DIAGNOSIS — E538 Deficiency of other specified B group vitamins: Secondary | ICD-10-CM

## 2020-04-23 DIAGNOSIS — E1169 Type 2 diabetes mellitus with other specified complication: Secondary | ICD-10-CM | POA: Diagnosis not present

## 2020-04-24 LAB — COMPREHENSIVE METABOLIC PANEL
AG Ratio: 1.6 (calc) (ref 1.0–2.5)
ALT: 15 U/L (ref 9–46)
AST: 12 U/L (ref 10–35)
Albumin: 4.1 g/dL (ref 3.6–5.1)
Alkaline phosphatase (APISO): 80 U/L (ref 35–144)
BUN: 20 mg/dL (ref 7–25)
CO2: 23 mmol/L (ref 20–32)
Calcium: 9.1 mg/dL (ref 8.6–10.3)
Chloride: 103 mmol/L (ref 98–110)
Creat: 1.17 mg/dL (ref 0.70–1.25)
Globulin: 2.5 g/dL (calc) (ref 1.9–3.7)
Glucose, Bld: 190 mg/dL — ABNORMAL HIGH (ref 65–99)
Potassium: 4.1 mmol/L (ref 3.5–5.3)
Sodium: 138 mmol/L (ref 135–146)
Total Bilirubin: 1.9 mg/dL — ABNORMAL HIGH (ref 0.2–1.2)
Total Protein: 6.6 g/dL (ref 6.1–8.1)

## 2020-04-24 LAB — HEMOGLOBIN A1C
Hgb A1c MFr Bld: 7.3 % of total Hgb — ABNORMAL HIGH (ref <5.7)
Mean Plasma Glucose: 163 (calc)
eAG (mmol/L): 9 (calc)

## 2020-04-24 LAB — TSH: TSH: 3.38 mIU/L (ref 0.40–4.50)

## 2020-04-24 LAB — IRON,TIBC AND FERRITIN PANEL
%SAT: 21 % (calc) (ref 20–48)
Ferritin: 339 ng/mL (ref 24–380)
Iron: 58 ug/dL (ref 50–180)
TIBC: 279 mcg/dL (calc) (ref 250–425)

## 2020-04-24 LAB — VITAMIN B12: Vitamin B-12: 335 pg/mL (ref 200–1100)

## 2020-04-30 ENCOUNTER — Ambulatory Visit (INDEPENDENT_AMBULATORY_CARE_PROVIDER_SITE_OTHER): Payer: PPO | Admitting: Family Medicine

## 2020-04-30 ENCOUNTER — Other Ambulatory Visit: Payer: Self-pay

## 2020-04-30 ENCOUNTER — Encounter: Payer: Self-pay | Admitting: Family Medicine

## 2020-04-30 VITALS — BP 130/78 | HR 59 | Temp 98.2°F | Ht 71.0 in | Wt 183.5 lb

## 2020-04-30 DIAGNOSIS — E785 Hyperlipidemia, unspecified: Secondary | ICD-10-CM

## 2020-04-30 DIAGNOSIS — E1169 Type 2 diabetes mellitus with other specified complication: Secondary | ICD-10-CM | POA: Diagnosis not present

## 2020-04-30 DIAGNOSIS — E1143 Type 2 diabetes mellitus with diabetic autonomic (poly)neuropathy: Secondary | ICD-10-CM

## 2020-04-30 DIAGNOSIS — E890 Postprocedural hypothyroidism: Secondary | ICD-10-CM

## 2020-04-30 DIAGNOSIS — Z8042 Family history of malignant neoplasm of prostate: Secondary | ICD-10-CM | POA: Diagnosis not present

## 2020-04-30 NOTE — Progress Notes (Signed)
Jeff Lopez DOB: Jun 04, 1951 Encounter date: 04/30/2020  This is a 69 y.o. male who presents with Chief Complaint  Patient presents with  . Follow-up    3 month follow up, patient would like to go over lab results.     History of present illness: Last visit with me was August/2021.  He did complete blood work on 10/29. -Bilirubin elevated at 1.9  *Type 2 diabetes: A1c was 7.3 on recent blood work.  Taking Metformin 500 mg daily. He is upset with A1C going up. He is doing all the right things. Doing low carb diet, feeling great. No red meat. No potatoes. Really thought it would be below 7. Rare soda - 2-3 times in a few months. Has eaten french fries but only a few times. Broccoli, chicken daily, carrots, brussel sprouts, no sweet tea, increased water intake. Doesn't eat fruit. No desserts. Ran out of metformin yesterday, but is taking it regularly.   Walking 2-3 miles/day. Weight today is lowest in a long time.   *Hypertension:irbesartan 300 mg, toprol 50 mg, amlodipine 5 mg. *Hyperlipidemia: does not want to be on statin *Hypothyroid: synthroid 169mcg daily *GERD: Protonix 40 mg twice daily  Father just passed away. Was 20 years old; just that body kind of gave out.    Allergies  Allergen Reactions  . Ceftin Other (See Comments)    Patient stated it caused "sores in mouth" Thrush  . Cefuroxime Axetil Other (See Comments)    Sores in mouth Sores in mouth Patient stated it caused "sores in mouth" Thrush other  . Cephalexin Other (See Comments)    Other   Current Meds  Medication Sig  . amLODipine (NORVASC) 5 MG tablet Take 5 mg by mouth daily.  . cholecalciferol (VITAMIN D3) 25 MCG (1000 UT) tablet Take 1,000 Units by mouth daily.  [name] desonide (DESOWEN) 0.05 % cream Apply 1 application topically daily.  . irbesartan (AVAPRO) 300 MG tablet TAKE 1 TABLET BY MOUTH EVERY DAY  . levothyroxine (SYNTHROID) 137 MCG tablet Take 1 tablet (137 mcg total) by mouth daily before  breakfast.  . metFORMIN (GLUCOPHAGE-XR) 500 MG 24 hr tablet TAKE 1 TABLET BY MOUTH EVERY DAY WITH BREAKFAST  . metoprolol succinate (TOPROL-XL) 50 MG 24 hr tablet TAKE 1 TABLET BY MOUTH EVERY DAY  . pantoprazole (PROTONIX) 40 MG tablet TAKE 1 TABLET BY MOUTH 2 TIMES DAILY    Review of Systems  Constitutional: Negative for chills, fatigue and fever.  Respiratory: Negative for cough, chest tightness, shortness of breath and wheezing.   Cardiovascular: Negative for chest pain, palpitations and leg swelling.    Objective:  BP 130/78   Pulse (!) 59   Temp 98.2 F (36.8 C) (Tympanic)   Ht 5\' 11"  (1.803 m)   Wt 183 lb 8 oz (83.2 kg)   SpO2 93%   BMI 25.59 kg/m   Weight: 183 lb 8 oz (83.2 kg)   BP Readings from Last 3 Encounters:  04/30/20 130/78  04/15/20 128/79  03/10/20 120/70   Wt Readings from Last 3 Encounters:  04/30/20 183 lb 8 oz (83.2 kg)  04/15/20 188 lb (85.3 kg)  03/10/20 188 lb (85.3 kg)    Physical Exam Constitutional:      General: He is not in acute distress.    Appearance: He is well-developed.  Cardiovascular:     Rate and Rhythm: Normal rate and regular rhythm.     Heart sounds: Normal heart sounds. No murmur heard.  No friction  rub.  Pulmonary:     Effort: Pulmonary effort is normal. No respiratory distress.     Breath sounds: Normal breath sounds. No wheezing or rales.  Musculoskeletal:     Right lower leg: No edema.     Left lower leg: No edema.  Neurological:     Mental Status: He is alert and oriented to person, place, and time.  Psychiatric:        Behavior: Behavior normal.     Assessment/Plan  1. Hyperlipidemia associated with type 2 diabetes mellitus ([place]) Cholesterol has been diet controlled. We did discuss benefits of statin in terms of prevention, but have agreed to consider with adding after rechecking lipids in 3 mo.  - Comprehensive metabolic panel; Future - Lipid panel; Future  2. Type II diabetes mellitus with peripheral  autonomic neuropathy ([facility]) Cont with metformin. Limit carbs to 3  Carb servings per meal.  - Hemoglobin A1c; Future  3. Hypothyroidism following radioiodine therapy Has been stable.   4. Family history of prostate cancer - PSA; Future    Return in about 3 months (around 07/31/2020) for bloodwork in 3 months with follow up visit.    Micheline Rough, MD

## 2020-05-12 ENCOUNTER — Ambulatory Visit (INDEPENDENT_AMBULATORY_CARE_PROVIDER_SITE_OTHER): Payer: PPO | Admitting: Specialist

## 2020-05-12 ENCOUNTER — Encounter: Payer: Self-pay | Admitting: Specialist

## 2020-05-12 ENCOUNTER — Ambulatory Visit: Payer: PPO | Admitting: Pulmonary Disease

## 2020-05-12 ENCOUNTER — Other Ambulatory Visit: Payer: Self-pay

## 2020-05-12 VITALS — BP 131/78 | HR 69 | Ht 71.0 in | Wt 189.4 lb

## 2020-05-12 DIAGNOSIS — Z981 Arthrodesis status: Secondary | ICD-10-CM

## 2020-05-12 DIAGNOSIS — M533 Sacrococcygeal disorders, not elsewhere classified: Secondary | ICD-10-CM

## 2020-05-12 DIAGNOSIS — T84498A Other mechanical complication of other internal orthopedic devices, implants and grafts, initial encounter: Secondary | ICD-10-CM | POA: Diagnosis not present

## 2020-05-12 MED ORDER — TRAMADOL HCL 50 MG PO TABS: 50.0000 mg | ORAL_TABLET | Freq: Four times a day (QID) | ORAL | 0 refills | Status: AC | PRN

## 2020-05-12 NOTE — Patient Instructions (Signed)
Hot showers in the AM.  Injection with steroid may be of benefit. Hemp CBD capsules, amazon.com 5,000-7,000 mg per bottle, 60 capsules per bottle, take one capsule twice a day. Avoid bending, stooping and avoid lifting weights greater than 10 lbs. Avoid prolong standing. Avoid frequent bending and stooping  No lifting greater than 10 lbs. May use ice or moist heat for pain. Weight loss is of benefit. Handicap license is approved. Yoga or stretching exercises like [place]. Follow-Up Instructions: No follow-ups on file. Tramadol for more severe pain.

## 2020-05-12 NOTE — Progress Notes (Signed)
Office Visit Note   Patient: Jeff Lopez           Date of Birth: May 24, 1951           MRN: [id number] Visit Date: 05/12/2020              Requested by: Caren Macadam, MD [place],  [place] [address] PCP: Caren Macadam, MD   Assessment & Plan: Visit Diagnoses:  1. Status post lumbar spinal fusion   2. Sacroiliac joint dysfunction of left side   3. Loosening of hardware in spine [facility])     Plan: Hot showers in the AM.  Injection with steroid may be of benefit. Hemp CBD capsules, amazon.com 5,000-7,000 mg per bottle, 60 capsules per bottle, take one capsule twice a day. Avoid bending, stooping and avoid lifting weights greater than 10 lbs. Avoid prolong standing. Avoid frequent bending and stooping  No lifting greater than 10 lbs. May use ice or moist heat for pain. Weight loss is of benefit. Handicap license is approved. Yoga or stretching exercises like [place] Do. Follow-Up Instructions: No follow-ups on file. Tramadol for more severe pain  Follow-Up Instructions: Return in about 1 year (around 05/12/2021).   Orders:  No orders of the defined types were placed in this encounter.  No orders of the defined types were placed in this encounter.     Procedures: No procedures performed   Clinical Data: No additional findings.   Subjective: Chief Complaint  Patient presents with  . Lower Back - Follow-up    Had left SI joint injection and he got a 50% relief from it. Back flares up for 2-3 days, makes it hard to get out of the bed and while driving.    69 year old right handed male with history of L2 to S1 fusion reports episodic flares of pain into the SI joints. He has persistent numbness and paresthesias left thigh. The left leg is no worse painful. No bowel or bladder difficulty. He has been walking up to 3.5 miles per day.   Review of Systems  Constitutional: Negative.   HENT: Negative.   Eyes: Negative.     Respiratory: Negative.   Cardiovascular: Negative.   Gastrointestinal: Negative.   Endocrine: Negative.   Genitourinary: Negative.   Musculoskeletal: Negative.   Skin: Negative.   Allergic/Immunologic: Negative.   Neurological: Negative.   Hematological: Negative.   Psychiatric/Behavioral: Negative.      Objective: Vital Signs: BP 131/78 (BP Location: Left Arm, Patient Position: Sitting)   Pulse 69   Ht '5\' 11"'  (1.803 m)   Wt 189 lb 6.4 oz (85.9 kg)   BMI 26.42 kg/m   Physical Exam Constitutional:      Appearance: He is well-developed.  HENT:     Head: Normocephalic and atraumatic.  Eyes:     Pupils: Pupils are equal, round, and reactive to light.  Pulmonary:     Effort: Pulmonary effort is normal.     Breath sounds: Normal breath sounds.  Abdominal:     General: Bowel sounds are normal.     Palpations: Abdomen is soft.  Musculoskeletal:     Cervical back: Normal range of motion and neck supple.     Lumbar back: Negative left straight leg raise test.  Skin:    General: Skin is warm and dry.  Neurological:     Mental Status: He is alert and oriented to person, place, and time.  Psychiatric:  Behavior: Behavior normal.        Thought Content: Thought content normal.        Judgment: Judgment normal.     Back Exam   Tenderness  The patient is experiencing tenderness in the lumbar.  Range of Motion  Extension: abnormal  Flexion: abnormal  Lateral bend right: abnormal  Lateral bend left: abnormal  Rotation right: abnormal  Rotation left: abnormal   Muscle Strength  Right Quadriceps:  5/5  Left Quadriceps:  5/5  Right Hamstrings:  5/5  Left Hamstrings:  5/5   Tests  Straight leg raise left: negative  Reflexes  Patellar: 2/4 Achilles: 2/4 Biceps: 2/4 Babinski's sign: normal   Other  Toe walk: normal Heel walk: normal Sensation: normal Gait: normal  Erythema: no back redness Scars: absent      Specialty Comments:  No specialty  comments available.  Imaging: No results found.   PMFS History: Patient Active Problem List   Diagnosis Date Noted  . Spinal stenosis, lumbar region, with neurogenic claudication 04/21/2015    Priority: High    Class: Chronic  . Spondylolisthesis of lumbar region 04/21/2015    Priority: High    Class: Chronic  . Postoperative CSF leak 08/10/2012    Priority: High    Class: Acute  . HNP (herniated nucleus pulposus), lumbar 08/06/2012    Priority: High    Class: Acute  . OSA (obstructive sleep apnea) 11/25/2018  . Non-restorative sleep 11/25/2018  . Malignant neoplasm of ascending colon ([place]) 05/16/2018  . Low testosterone 03/02/2018  . Gynecomastia 02/28/2018  . Left lower quadrant pain 12/26/2017  . Arthralgia 10/03/2017  . Numbness 10/03/2017  . Chronic pansinusitis 01/09/2017  . Deviated septum 01/09/2017  . Chronic left sacroiliac pain 10/19/2016  . Chronic left-sided low back pain with left-sided sciatica 07/31/2016  . Hypothyroidism following radioiodine therapy 03/10/2015  . Screening for prostate cancer 03/10/2015  . Hemochromatosis 03/27/2014  . Choledocholithiasis 07/14/2013  . Nonspecific (abnormal) findings on radiological and other examination of biliary tract 07/13/2013  . Calculus of gallbladder with acute cholecystitis, without mention of obstruction 07/13/2013  . Calculus of bile duct without mention of cholecystitis or obstruction 07/13/2013  . Abdominal pain 07/12/2013  . Neck pain on left side 05/30/2013  . Quit smoking 11/28/2012  . Degenerative disc disease, lumbar 08/06/2012    Class: Chronic  . Type II diabetes mellitus with peripheral autonomic neuropathy ([place]) 06/10/2012  . COPD GOLD 0 02/09/2012  . Chest pain at rest 02/06/2012  . Allergic rhinitis 02/06/2012  . Radiculopathy of leg 11/05/2010  . RENAL CYST 05/27/2010  . LUMBAR DISC DISORDER 05/27/2010  . HYPERTENSION, BENIGN 04/19/2010  . CAD, NATIVE VESSEL 04/19/2010  . Hyperlipidemia  associated with type 2 diabetes mellitus ([place]) 04/09/2007  . GERD 04/09/2007  . OSTEOARTHRITIS, LUMBAR SPINE 04/09/2007   Past Medical History:  Diagnosis Date  . Allergy    per pt  . CAD, NATIVE VESSEL 04/19/2010   nonobstructive by cath 2007:  oLAD 20-30%, mLAD 50%, pCFX 20-30%, oAVCFX 20-30%, L renal art 50%;  normal LVF  . Cataract    bil cataracts removed  . COPD (chronic obstructive pulmonary disease) ([place])   . Diabetes mellitus without complication ([place])   . GERD 04/09/2007  . Headache(784.0)   . HYPERLIPIDEMIA 04/09/2007   Patient denies.  [name] HYPERTENSION, BENIGN 04/19/2010  . Hypothyroidism   . Hypothyroidism    previous hyperthyroidism, s/p I-131  . LUMBAR DISC DISORDER 05/27/2010  . Nerve damage  Left leg  . OSTEOARTHRITIS, LUMBAR SPINE 04/09/2007  . RENAL CYST 05/27/2010  . Spinal headache    After having back surgery in 2014    Family History  Problem Relation Age of Onset  . Breast cancer Mother   . Uterine cancer Mother   . Heart disease Mother   . Heart attack Mother 9  . Prostate cancer Father   . Hypertension Brother   . Non-Hodgkin's lymphoma Brother   . Stomach cancer Paternal Grandmother   . Thyroid disease Neg Hx   . Colon cancer Neg Hx   . Esophageal cancer Neg Hx   . Rectal cancer Neg Hx   . Pancreatic cancer Neg Hx   . Colon polyps Neg Hx     Past Surgical History:  Procedure Laterality Date  . BACK SURGERY  2012,2014  . CARDIAC CATHETERIZATION  2007   Dr Loanne Drilling  . CHOLECYSTECTOMY    . CHOLECYSTECTOMY N/A 07/13/2013   Procedure: LAPAROSCOPIC CHOLECYSTECTOMY WITH INTRAOPERATIVE CHOLANGIOGRAM;  Surgeon: Shann Medal, MD;  Location[facility];  Service: General;  Laterality: N/A;  . COLONOSCOPY  2018  . COLONOSCOPY W/ POLYPECTOMY    . ERCP N/A 07/14/2013   Procedure: ENDOSCOPIC RETROGRADE CHOLANGIOPANCREATOGRAPHY (ERCP);  Surgeon: [name], MD;  Location: [facility];  Service: Endoscopy;  Laterality: N/A;  . EYE SURGERY Bilateral     Cataract removal  . FOOT FRACTURE SURGERY    . FRACTURE SURGERY    . LUMBAR DISC SURGERY  08/06/2012   L3 & L4  . LUMBAR DISC SURGERY  11/11/2015   L1 L2    DR Serah Nicoletti  . LUMBAR FUSION N/A 04/20/2015   Procedure: T12 to L1 fusion (Extension of Previous Fusion L2-S1 to T12-S1), Right Transforaminal lumbar interbody fusion, Posterior Fusion T12 to L1, with Pedicle screws, allograft, local bone graft, and  Vivigen;  Surgeon: [name], MD;  Location: [facility];  Service: Orthopedics;  Laterality: N/A;  . LUMBAR LAMINECTOMY N/A 11/10/2013   Procedure: Left L1-2 far lateral approach to excise herniated nucleus pulposus;  Surgeon: [name], MD;  Location: [facility];  Service: Orthopedics;  Laterality: N/A;  . LUMBAR LAMINECTOMY/DECOMPRESSION MICRODISCECTOMY Left 08/10/2012   Procedure: Dura Repair Left Side L2-L3;  Surgeon: [name], MD;  Location: [facility];  Service: Orthopedics;  Laterality: Left;  Wilson Frame, Sliding table, dura repair kit, microscope  . NECK SURGERY     X 2  . SPINE SURGERY  2006   C-spine surgery x 2  . UPPER GASTROINTESTINAL ENDOSCOPY     Social History   Occupational History  . 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 Smoking status: Former Smoker    Packs/day: 1.00    Years: 37.00    Pack years: 37.00    Types: Cigarettes    Quit date: 04/06/2008    Years since quitting: 12.1  . Smokeless tobacco: Never Used  . Tobacco comment: pt has stopped smoking about 9 months now  Vaping Use  . Vaping Use: Never used  Substance and Sexual Activity  . Alcohol use: No  . Drug use: No  . Sexual activity: Not Currently

## 2020-05-13 ENCOUNTER — Ambulatory Visit: Payer: PPO | Admitting: Pulmonary Disease

## 2020-05-27 NOTE — Progress Notes (Signed)
Subjective:   LONIE NEWSHAM is a 69 y.o. male who presents for Medicare Annual/Subsequent preventive examination.  Review of Systems    N/A  Cardiac Risk Factors include: advanced age (>34mn, >>41women);male gender;diabetes mellitus;dyslipidemia;hypertension     Objective:    Today's Vitals   05/28/20 0903  BP: 140/80  Pulse: (!) 59  Temp: 97.8 F (36.6 C)  TempSrc: Oral  SpO2: 96%  Weight: 185 lb 9 oz (84.2 kg)  Height: _0  (1.803 m)   Body mass index is 25.88 kg/m.  Advanced Directives 05/28/2020 04/26/2018 04/05/2017 09/21/2016 08/25/2016 08/15/2016 07/24/2016  Does Patient Have a Medical Advance Directive? Yes Yes No Yes Yes Yes Yes  Type of Advance Directive Living will;Healthcare Power of Attorney - - Living will Living will Living will Living will  Does patient want to make changes to medical advance directive? No - Patient declined - - - - - -  Copy of [place]? No - copy requested - - - - - -  Would patient like information on creating a medical advance directive? - - - - - - -  Pre-existing out of facility DNR order (yellow form or pink MOST form) - - - - - - -    Current Medications (verified) Outpatient Encounter Medications as of 05/28/2020  Medication Sig  . amLODipine (NORVASC) 5 MG tablet Take 5 mg by mouth daily.  . cholecalciferol (VITAMIN D3) 25 MCG (1000 UT) tablet Take 1,000 Units by mouth daily.  .Marland Kitchendesonide (DESOWEN) 0.05 % cream Apply 1 application topically daily.  . Eszopiclone 3 MG TABS Take 1 tablet (3 mg total) by mouth at bedtime as needed. Take immediately before bedtime  . irbesartan (AVAPRO) 300 MG tablet TAKE 1 TABLET BY MOUTH EVERY DAY  . levothyroxine (SYNTHROID) 137 MCG tablet Take 1 tablet (137 mcg total) by mouth daily before breakfast.  . metFORMIN (GLUCOPHAGE-XR) 500 MG 24 hr tablet TAKE 1 TABLET BY MOUTH EVERY DAY WITH BREAKFAST  . metoprolol succinate (TOPROL-XL) 50 MG 24 hr tablet TAKE 1 TABLET BY  MOUTH EVERY DAY  . pantoprazole (PROTONIX) 40 MG tablet TAKE 1 TABLET BY MOUTH 2 TIMES DAILY   No facility-administered encounter medications on file as of 05/28/2020.    Allergies (verified) Ceftin, Cefuroxime axetil, and Cephalexin   History: Past Medical History:  Diagnosis Date  . Allergy    per pt  . CAD, NATIVE VESSEL 04/19/2010   nonobstructive by cath 2007:  oLAD 20-30%, mLAD 50%, pCFX 20-30%, oAVCFX 20-30%, L renal art 50%;  normal LVF  . Cataract    bil cataracts removed  . COPD (chronic obstructive pulmonary disease) [place] [facility]   . Diabetes mellitus without complication ([place]   . GERD 04/09/2007  . Headache(784.0)   . HYPERLIPIDEMIA 04/09/2007   Patient denies.  .Marland KitchenHYPERTENSION, BENIGN 04/19/2010  . Hypothyroidism   . Hypothyroidism    previous hyperthyroidism, s/p I-131  . LUMBAR DISC DISORDER 05/27/2010  . Nerve damage    Left leg  . OSTEOARTHRITIS, LUMBAR SPINE 04/09/2007  . RENAL CYST 05/27/2010  . Spinal headache    After having back surgery in 2014   Past Surgical History:  Procedure Laterality Date  . BACK SURGERY  2012,2014  . CARDIAC CATHETERIZATION  2007   Dr ELoanne Drilling . CHOLECYSTECTOMY    . CHOLECYSTECTOMY N/A 07/13/2013   Procedure: LAPAROSCOPIC CHOLECYSTECTOMY WITH INTRAOPERATIVE CHOLANGIOGRAM;  Surgeon: DShann Medal MD;  Location: [facility];  Service: General;  Laterality: N/A;  . COLONOSCOPY  2018  . COLONOSCOPY W/ POLYPECTOMY    . ERCP N/A 07/14/2013   Procedure: ENDOSCOPIC RETROGRADE CHOLANGIOPANCREATOGRAPHY (ERCP);  Surgeon: [name], MD;  Location: [facility];  Service: Endoscopy;  Laterality: N/A;  . EYE SURGERY Bilateral    Cataract removal  . FOOT FRACTURE SURGERY    . FRACTURE SURGERY    . LUMBAR DISC SURGERY  08/06/2012   L3 & L4  . LUMBAR DISC SURGERY  11/11/2015   L1 L2    DR NITKA  . LUMBAR FUSION N/A 04/20/2015   Procedure: T12 to L1 fusion (Extension of Previous Fusion L2-S1 to T12-S1), Right Transforaminal lumbar interbody  fusion, Posterior Fusion T12 to L1, with Pedicle screws, allograft, local bone graft, and  Vivigen;  Surgeon: [name], MD;  Location: [facility];  Service: Orthopedics;  Laterality: N/A;  . LUMBAR LAMINECTOMY N/A 11/10/2013   Procedure: Left L1-2 far lateral approach to excise herniated nucleus pulposus;  Surgeon: [name], MD;  Location: [facility];  Service: Orthopedics;  Laterality: N/A;  . LUMBAR LAMINECTOMY/DECOMPRESSION MICRODISCECTOMY Left 08/10/2012   Procedure: Dura Repair Left Side L2-L3;  Surgeon: [name], MD;  Location: [facility];  Service: Orthopedics;  Laterality: Left;  Wilson Frame, Sliding table, dura repair kit, microscope  . NECK SURGERY     X 2  . SPINE SURGERY  2006   C-spine surgery x 2  . UPPER GASTROINTESTINAL ENDOSCOPY     Family History  Problem Relation Age of Onset  . Breast cancer Mother   . Uterine cancer Mother   . Heart disease Mother   . Heart attack Mother 21  . Prostate cancer Father   . Hypertension Brother   . Non-Hodgkin's lymphoma Brother   . Stomach cancer Paternal Grandmother   . Thyroid disease Neg Hx   . Colon cancer Neg Hx   . Esophageal cancer Neg Hx   . Rectal cancer Neg Hx   . Pancreatic cancer Neg Hx   . Colon polyps Neg Hx    Social History   Socioeconomic History  . Marital status: Single    Spouse name: Not on file  . Number of children: 1  . Years of education: Not on file  . Highest education level: Not on file  Occupational History  . 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 Smoking status: Former Smoker    Packs/day: 1.00    Years: 37.00    Pack years: 37.00    Types: Cigarettes    Quit date: 04/06/2008    Years since quitting: 12.1  . Smokeless tobacco: Never Used  . Tobacco comment: pt has stopped smoking about 9 months now  Vaping Use  . Vaping Use: Never used  Substance and Sexual Activity  . Alcohol use: No  . Drug use: No  . Sexual activity: Not Currently  Other  Topics Concern  . Not on file  Social History Narrative   Works in Psychologist, educational. Retired.   Lives alone, has one child in [place].    Social Determinants of Health   Financial Resource Strain: Low Risk   . Difficulty of Paying Living Expenses: Not hard at all  Food Insecurity: No Food Insecurity  . Worried About Charity fundraiser in the Last Year: Never true  . Ran Out of Food in the Last Year: Never true  Transportation Needs: No Transportation Needs  . Lack of Transportation (  Medical): No  . Lack of Transportation (Non-Medical): No  Physical Activity: Inactive  . Days of Exercise per Week: 0 days  . Minutes of Exercise per Session: 0 min  Stress: No Stress Concern Present  . Feeling of Stress : Only a little  Social Connections: Socially Isolated  . Frequency of Communication with Friends and Family: More than three times a week  . Frequency of Social Gatherings with Friends and Family: Once a week  . Attends Religious Services: Never  . Active Member of Clubs or Organizations: No  . Attends Archivist Meetings: Never  . Marital Status: Widowed    Tobacco Counseling Counseling given: Not Answered Comment: pt has stopped smoking about 9 months now   Clinical Intake:  Pre-visit preparation completed: Yes  Pain : No/denies pain     Nutritional Risks: None Diabetes: Yes CBG done?: No Did pt. bring in CBG monitor from home?: No  How often do you need to have someone help you when you read instructions, pamphlets, or other written materials from your doctor or pharmacy?: 1 - Never What is the last grade level you completed in school?: College  Diabetic?yes Nutrition Risk Assessment:  Has the patient had any N/V/D within the last 2 months?  No  Does the patient have any non-healing wounds?  No  Has the patient had any unintentional weight loss or weight gain?  No   Diabetes:  Is the patient diabetic?  Yes  If diabetic, was a CBG obtained today?   No  Did the patient bring in their glucometer from home?  No  How often do you monitor your CBG's? Patient states does not check blood sugars at home.   Financial Strains and Diabetes Management:  Are you having any financial strains with the device, your supplies or your medication? No .  Does the patient want to be seen by Chronic Care Management for management of their diabetes?  No  Would the patient like to be referred to a Nutritionist or for Diabetic Management?  No   Diabetic Exams:  Diabetic Eye Exam: Completed 10/23/2019 Diabetic Foot Exam: Completed 01/28/2020   Interpreter Needed?: No  Information entered by :: [place] of Daily Living In your present state of health, do you have any difficulty performing the following activities: 05/28/2020  Hearing? N  Vision? N  Difficulty concentrating or making decisions? N  Walking or climbing stairs? N  Dressing or bathing? N  Doing errands, shopping? N  Preparing Food and eating ? N  Using the Toilet? N  In the past six months, have you accidently leaked urine? N  Do you have problems with loss of bowel control? N  Managing your Medications? N  Managing your Finances? N  Housekeeping or managing your Housekeeping? N  Some recent data might be hidden    Patient Care Team: Caren Macadam, MD as PCP - General (Family Medicine) Sherren Mocha, MD as PCP - Cardiology (Cardiology) Alphonsa Overall, MD as Consulting Physician (General Surgery) Earnie Larsson, [facility] as Pharmacist (Pharmacist)  Indicate any recent Medical Services you may have received from other than [facility] providers in the past year (date may be approximate).     Assessment:   This is a routine wellness examination for Zubayr.  Hearing/Vision screen  Hearing Screening   125Hz 250Hz 500Hz 1000Hz 2000Hz 3000Hz 4000Hz 6000Hz 8000Hz  Right ear:           Left ear:  Vision Screening Comments: Patient stated gets eyes checked  once per year . Had cataract surgery. Wears reading glasses only   Dietary issues and exercise activities discussed: Current Exercise Habits: The patient does not participate in regular exercise at present  Goals    . DIET - DECREASE SODA OR JUICE INTAKE    . Exercise 3x per week (30 min per time)    . HEMOGLOBIN A1C < 7    . Pharmacy Care Plan     CARE PLAN ENTRY  Current Barriers:  . Chronic Disease Management support, education, and care coordination needs related to HTN, HLD/ atherosclerosis of native coronary artery of native heart, DMII, and Hypothyroidism, GERD, Vitamin D deficiency, Insomnia  Pharmacist Clinical Goal(s):  [name] Work with the care management team to address educational, disease management, and care coordination needs  . Call provider office for new or worsened signs and symptoms  . Continue self health monitoring activities as directed today (blood pressure monitoring)  . Call care management team with questions or concerns. .  Diabetes:  [name] Achieve A1c <7.0% or 6.5% (based on your provider). . Recent A1c: 7.5 (07/24/2019) . Within this year, obtain diabetes preventative health exams (eye and foot exams yearly). . Blood pressure:  [name] Maintain Blood pressure <130/80 mmHg.  [name] Recent blood pressure reading: 128/84 mmHg.  . Maintain low salt diet.  . High cholesterol:  . Cholesterol goals: Total Cholesterol goal under 200, Triglycerides goal under 150, HDL goal above 40 (men) or above 50 (women), LDL goal under 70.  [name] Recent cholesterol levels (02/12/2019) - Total cholesterol: 161 - Triglycerides: 112 - HDL: 42.50 - LDL: 96 . Hypothyroidism . Maintain TSH between 0.45 to 4.5uIU/ml. . Current TSH: 2.85 (02/12/2019) . GERD/ heartburn . Minimize reflux symptoms.  . Vitamin D deficiency . Maintain Vitamin D-25 level at or above 30.  [name] Last Vitamin D-25 (10/03/2017): 24.43 . Insomnia . Continue to see improvement in insomnia.   Interventions: . Comprehensive  medication review performed. . Lifestyle modifications . Engaging in at least 150 minutes per week of moderate-intensity exercise such as brisk walking (15- to 20-minute mile) or something similar. Recommended they incorporate flexibility, balance, and some type of strength training exercises.  . Diabetes . Discussed importance of diabetes preventative health exams. . Obtain diabetic eye exam every 1 to 2 years.  . Obtain at least once yearly foot exams. . Continue: metformin ER 500mg , 1 tablet once daily with breakfast  . Blood pressure:  . Discussed need to continue checking blood pressure at home.  . Discussed diet modifications. DASH diet:  following a diet emphasizing fruits and vegetables and low-fat dairy products along with whole grains, fish, poultry, and nuts. Reducing red meats and sugars.  . Exercising- see exercising section above . Reducing the amount of salt intake to 1500mg /per day.  . Recommend using a salt substitute to replace your salt if you need flavor.      . Weight reduction- We discussed losing 5-10% of body weight . Continue:  - amlodipine 5mg , 1 tablet once daily  - irbesartan 300mg , 1 tablet once daily - metoprolol succinate 50mg , 1 tablet once daily  . Cholesterol/ atherosclerosis of native coronary artery of native heart . How to reduce cholesterol through diet/weight management and physical activity.    . We discussed how a diet high in plant sterols (fruits/vegetables/nuts/whole grains/legumes) may reduce your cholesterol.  Encouraged increasing fiber to a daily intake of 10-25g/day  . Continue: diet modifications .  Obtain cholesterol blood work (lipid panel) on 10/20/2019 and discuss addition of statins at your next visit with Dr. Ethlyn Gallery on 10/27/2019.  [name] Hypothyroidism . Continue: levothyroxine 154mg, 1 tablet once daily before breakfast  . Obtain blood work for TSH on 10/20/2019 and for reassessment of levels and medication. . Insomnia:  . Discussed  non-pharmacological interventions for insomnia. (Avoid napping, limit exposure to technology near bedtime, etc) . Continue: to work on sleep hygiene.  . GERD/ heart burn: . Discussed non-pharmacological interventions for acid reflux. Take measures to prevent acid reflux, such as avoiding spicy foods, avoiding caffeine, avoid laying down a few hours after eating, and raising the head of the bed. . Continue: pantoprazole 415m 1 tablet twice daily (or as directed by your provider) . Vitamin D deficiency . Continue: cholecalciferol (Vitamin D3) 25107m(1000 units), 1 capsule once daily  . Obtain blood work for vitamin D on 10/20/2019 and for reassessment of levels and medication.   Patient Self Care Activities:  . Self administers medications as prescribed and Calls provider office for new concerns or questions . Continue current medications as directed by providers.  . Continue following up with primary care provider and/or specialists. . Continue at home blood pressure readings. . Continue working on health habits (diet/ exercise).  Initial goal documentation       Depression Screen PHQ 2/9 Scores 05/28/2020 04/30/2020 01/28/2020 02/28/2018 07/24/2016  PHQ - 2 Score 0 0 0 0 1  PHQ- 9 Score 0 - - - 4  Exception Documentation Patient refusal - - - -    Fall Risk Fall Risk  05/28/2020 04/30/2020 04/26/2018 02/28/2018 09/21/2016  Falls in the past year? 0 0 0 No No  Number falls in past yr: 0 - - - -  Injury with Fall? 0 - - - -  Risk for fall due to : No Fall Risks - - - -  Follow up Falls evaluation completed;Falls prevention discussed - - - -    Any stairs in or around the home? Yes  If so, are there any without handrails? No  Home free of loose throw rugs in walkways, pet beds, electrical cords, etc? Yes  Adequate lighting in your home to reduce risk of falls? Yes   ASSISTIVE DEVICES UTILIZED TO PREVENT FALLS:  Life alert? No  Use of a cane, walker or w/c? No  Grab bars in the bathroom?  No  Shower chair or bench in shower? No  Elevated toilet seat or a handicapped toilet? No   TIMED UP AND GO:  Was the test performed? Yes .  Length of time to ambulate 10 feet: 4 sec.   Gait steady and fast without use of assistive device  Cognitive Function:  Cognitive screening not indicated based on direct observation.      Immunizations Immunization History  Administered Date(s) Administered  . Fluad Quad(high Dose 65+) 04/08/2019  . Influenza Split 03/16/2011, 04/12/2012  . Influenza Whole 04/23/2010  . Influenza, High Dose Seasonal PF 02/15/2015  . Influenza,inj,Quad PF,6+ Mos 03/19/2014, 04/04/2018  . Influenza-Unspecified 04/08/2013, 04/13/2019, 04/16/2020  . PFIZER SARS-COV-2 Vaccination 09/24/2019, 10/09/2019, 04/16/2020  . Pneumococcal Conjugate-13 02/12/2014  . Pneumococcal Polysaccharide-23 05/27/2010, 04/29/2018  . Td 01/24/2006  . Tdap 02/28/2018  . Varicella 05/10/2012  . Zoster Recombinat (Shingrix) 03/08/2017    TDAP status: Up to date Flu Vaccine status: Up to date Pneumococcal vaccine status: Up to date Covid-19 vaccine status: Completed vaccines  Qualifies for Shingles Vaccine? Yes   Zostavax completed  No   Shingrix Completed?: Yes  Screening Tests Health Maintenance  Topic Date Due  . HEMOGLOBIN A1C  10/22/2020  . OPHTHALMOLOGY EXAM  10/22/2020  . FOOT EXAM  01/27/2021  . COLONOSCOPY  10/29/2026  . TETANUS/TDAP  02/29/2028  . INFLUENZA VACCINE  Completed  . COVID-19 Vaccine  Completed  . Hepatitis C Screening  Completed  . PNA vac Low Risk Adult  Completed    Health Maintenance  There are no preventive care reminders to display for this patient.  Colorectal cancer screening: Completed 10/29/2019. Repeat every 5 years  Lung Cancer Screening: (Low Dose CT Chest recommended if Age 31-80 years, 30 pack-year currently smoking OR have quit w/in 15years.) does qualify.   Lung Cancer Screening Referral: No had recent CT Lung Cancer  screening 02/2020  Additional Screening:  Hepatitis C Screening: does qualify; Completed 06/05/2016  Vision Screening: Recommended annual ophthalmology exams for early detection of glaucoma and other disorders of the eye. Is the patient up to date with their annual eye exam?  Yes  Who is the provider or what is the name of the office in which the patient attends annual eye exams? [facility]  If pt is not established with a provider, would they like to be referred to a provider to establish care? No .   Dental Screening: Recommended annual dental exams for proper oral hygiene  Community Resource Referral / Chronic Care Management: CRR required this visit?  No   CCM required this visit?  No      Plan:     I have personally reviewed and noted the following in the patient's chart:   . Medical and social history . Use of alcohol, tobacco or illicit drugs  . Current medications and supplements . Functional ability and status . Nutritional status . Physical activity . Advanced directives . List of other physicians . Hospitalizations, surgeries, and ER visits in previous 12 months . Vitals . Screenings to include cognitive, depression, and falls . Referrals and appointments  In addition, I have reviewed and discussed with patient certain preventive protocols, quality metrics, and best practice recommendations. A written personalized care plan for preventive services as well as general preventive health recommendations were provided to patient.     Ofilia Neas, LPN   [id number]   Nurse Notes: None

## 2020-05-28 ENCOUNTER — Ambulatory Visit (INDEPENDENT_AMBULATORY_CARE_PROVIDER_SITE_OTHER): Payer: PPO

## 2020-05-28 ENCOUNTER — Other Ambulatory Visit: Payer: Self-pay

## 2020-05-28 VITALS — BP 140/80 | HR 59 | Temp 97.8°F | Ht 71.0 in | Wt 185.6 lb

## 2020-05-28 DIAGNOSIS — Z0000 Encounter for general adult medical examination without abnormal findings: Secondary | ICD-10-CM

## 2020-05-28 NOTE — Patient Instructions (Signed)
[name] , Thank you for taking time to come for your Medicare Wellness Visit. I appreciate your ongoing commitment to your health goals. Please review the following plan we discussed and let me know if I can assist you in the future.   Screening recommendations/referrals: Colonoscopy: Up to date, next due 10/29/2026 Recommended yearly ophthalmology/optometry visit for glaucoma screening and checkup Recommended yearly dental visit for hygiene and checkup  Vaccinations: Influenza vaccine: Up to date, next due fall 2022 Pneumococcal vaccine: Completed series  Tdap vaccine: Up to date, next due 02/29/2028 Shingles vaccine: Completed series     Advanced directives: Please bring a copy of your advanced medical directives into our office so that it may be scan into your chart.  Conditions/risks identified: None   Next appointment: 08/04/2020 @ 9:30 am with Dr. Ethlyn Gallery   Preventive Care 68 Years and Older, Male Preventive care refers to lifestyle choices and visits with your health care provider that can promote health and wellness. What does preventive care include?  A yearly physical exam. This is also called an annual well check.  Dental exams once or twice a year.  Routine eye exams. Ask your health care provider how often you should have your eyes checked.  Personal lifestyle choices, including:  Daily care of your teeth and gums.  Regular physical activity.  Eating a healthy diet.  Avoiding tobacco and drug use.  Limiting alcohol use.  Practicing safe sex.  Taking low doses of aspirin every day.  Taking vitamin and mineral supplements as recommended by your health care provider. What happens during an annual well check? The services and screenings done by your health care provider during your annual well check will depend on your age, overall health, lifestyle risk factors, and family history of disease. Counseling  Your health care provider may ask you questions  about your:  Alcohol use.  Tobacco use.  Drug use.  Emotional well-being.  Home and relationship well-being.  Sexual activity.  Eating habits.  History of falls.  Memory and ability to understand (cognition).  Work and work Statistician. Screening  You may have the following tests or measurements:  Height, weight, and BMI.  Blood pressure.  Lipid and cholesterol levels. These may be checked every 5 years, or more frequently if you are over 3 years old.  Skin check.  Lung cancer screening. You may have this screening every year starting at age 22 if you have a 30-pack-year history of smoking and currently smoke or have quit within the past 15 years.  Fecal occult blood test (FOBT) of the stool. You may have this test every year starting at age 52.  Flexible sigmoidoscopy or colonoscopy. You may have a sigmoidoscopy every 5 years or a colonoscopy every 10 years starting at age 65.  Prostate cancer screening. Recommendations will vary depending on your family history and other risks.  Hepatitis C blood test.  Hepatitis B blood test.  Sexually transmitted disease (STD) testing.  Diabetes screening. This is done by checking your blood sugar (glucose) after you have not eaten for a while (fasting). You may have this done every 1-3 years.  Abdominal aortic aneurysm (AAA) screening. You may need this if you are a current or former smoker.  Osteoporosis. You may be screened starting at age 3 if you are at high risk. Talk with your health care provider about your test results, treatment options, and if necessary, the need for more tests. Vaccines  Your health care provider may recommend  certain vaccines, such as:  Influenza vaccine. This is recommended every year.  Tetanus, diphtheria, and acellular pertussis (Tdap, Td) vaccine. You may need a Td booster every 10 years.  Zoster vaccine. You may need this after age 23.  Pneumococcal 13-valent conjugate (PCV13)  vaccine. One dose is recommended after age 70.  Pneumococcal polysaccharide (PPSV23) vaccine. One dose is recommended after age 30. Talk to your health care provider about which screenings and vaccines you need and how often you need them. This information is not intended to replace advice given to you by your health care provider. Make sure you discuss any questions you have with your health care provider. Document Released: 07/09/2015 Document Revised: 03/01/2016 Document Reviewed: 04/13/2015 Elsevier Interactive Patient Education  2017 [place] Prevention in the Home Falls can cause injuries. They can happen to people of all ages. There are many things you can do to make your home safe and to help prevent falls. What can I do on the outside of my home?  Regularly fix the edges of walkways and driveways and fix any cracks.  Remove anything that might make you trip as you walk through a door, such as a raised step or threshold.  Trim any bushes or trees on the path to your home.  Use bright outdoor lighting.  Clear any walking paths of anything that might make someone trip, such as rocks or tools.  Regularly check to see if handrails are loose or broken. Make sure that both sides of any steps have handrails.  Any raised decks and porches should have guardrails on the edges.  Have any leaves, snow, or ice cleared regularly.  Use sand or salt on walking paths during winter.  Clean up any spills in your garage right away. This includes oil or grease spills. What can I do in the bathroom?  Use night lights.  Install grab bars by the toilet and in the tub and shower. Do not use towel bars as grab bars.  Use non-skid mats or decals in the tub or shower.  If you need to sit down in the shower, use a plastic, non-slip stool.  Keep the floor dry. Clean up any water that spills on the floor as soon as it happens.  Remove soap buildup in the tub or shower  regularly.  Attach bath mats securely with double-sided non-slip rug tape.  Do not have throw rugs and other things on the floor that can make you trip. What can I do in the bedroom?  Use night lights.  Make sure that you have a light by your bed that is easy to reach.  Do not use any sheets or blankets that are too big for your bed. They should not hang down onto the floor.  Have a firm chair that has side arms. You can use this for support while you get dressed.  Do not have throw rugs and other things on the floor that can make you trip. What can I do in the kitchen?  Clean up any spills right away.  Avoid walking on wet floors.  Keep items that you use a lot in easy-to-reach places.  If you need to reach something above you, use a strong step stool that has a grab bar.  Keep electrical cords out of the way.  Do not use floor polish or wax that makes floors slippery. If you must use wax, use non-skid floor wax.  Do not have throw rugs and other  things on the floor that can make you trip. What can I do with my stairs?  Do not leave any items on the stairs.  Make sure that there are handrails on both sides of the stairs and use them. Fix handrails that are broken or loose. Make sure that handrails are as long as the stairways.  Check any carpeting to make sure that it is firmly attached to the stairs. Fix any carpet that is loose or worn.  Avoid having throw rugs at the top or bottom of the stairs. If you do have throw rugs, attach them to the floor with carpet tape.  Make sure that you have a light switch at the top of the stairs and the bottom of the stairs. If you do not have them, ask someone to add them for you. What else can I do to help prevent falls?  Wear shoes that:  Do not have high heels.  Have rubber bottoms.  Are comfortable and fit you well.  Are closed at the toe. Do not wear sandals.  If you use a stepladder:  Make sure that it is fully  opened. Do not climb a closed stepladder.  Make sure that both sides of the stepladder are locked into place.  Ask someone to hold it for you, if possible.  Clearly mark and make sure that you can see:  Any grab bars or handrails.  First and last steps.  Where the edge of each step is.  Use tools that help you move around (mobility aids) if they are needed. These include:  Canes.  Walkers.  Scooters.  Crutches.  Turn on the lights when you go into a dark area. Replace any light bulbs as soon as they burn out.  Set up your furniture so you have a clear path. Avoid moving your furniture around.  If any of your floors are uneven, fix them.  If there are any pets around you, be aware of where they are.  Review your medicines with your doctor. Some medicines can make you feel dizzy. This can increase your chance of falling. Ask your doctor what other things that you can do to help prevent falls. This information is not intended to replace advice given to you by your health care provider. Make sure you discuss any questions you have with your health care provider. Document Released: 04/08/2009 Document Revised: 11/18/2015 Document Reviewed: 07/17/2014 Elsevier Interactive Patient Education  2017 [employer].

## 2020-06-18 ENCOUNTER — Other Ambulatory Visit: Payer: Self-pay | Admitting: Cardiovascular Disease

## 2020-06-18 ENCOUNTER — Other Ambulatory Visit: Payer: Self-pay | Admitting: Family Medicine

## 2020-06-18 DIAGNOSIS — E890 Postprocedural hypothyroidism: Secondary | ICD-10-CM

## 2020-06-30 ENCOUNTER — Other Ambulatory Visit: Payer: Self-pay | Admitting: Gastroenterology

## 2020-06-30 ENCOUNTER — Other Ambulatory Visit: Payer: Self-pay | Admitting: Family Medicine

## 2020-06-30 DIAGNOSIS — E1169 Type 2 diabetes mellitus with other specified complication: Secondary | ICD-10-CM

## 2020-07-13 NOTE — Addendum Note (Signed)
Addended by: Marrion Coy on: 07/13/2020 01:01 PM   Modules accepted: Orders

## 2020-07-19 ENCOUNTER — Other Ambulatory Visit: Payer: Self-pay | Admitting: Cardiovascular Disease

## 2020-07-20 ENCOUNTER — Other Ambulatory Visit: Payer: Self-pay

## 2020-07-20 ENCOUNTER — Other Ambulatory Visit (INDEPENDENT_AMBULATORY_CARE_PROVIDER_SITE_OTHER): Payer: PPO

## 2020-07-20 DIAGNOSIS — E785 Hyperlipidemia, unspecified: Secondary | ICD-10-CM

## 2020-07-20 DIAGNOSIS — E1143 Type 2 diabetes mellitus with diabetic autonomic (poly)neuropathy: Secondary | ICD-10-CM

## 2020-07-20 DIAGNOSIS — E1169 Type 2 diabetes mellitus with other specified complication: Secondary | ICD-10-CM | POA: Diagnosis not present

## 2020-07-20 DIAGNOSIS — Z8042 Family history of malignant neoplasm of prostate: Secondary | ICD-10-CM | POA: Diagnosis not present

## 2020-07-20 LAB — LIPID PANEL
Cholesterol: 147 mg/dL (ref 0–200)
HDL: 40.5 mg/dL (ref >39.00)
LDL Cholesterol: 74 mg/dL (ref 0–99)
NonHDL: 106.54
Total CHOL/HDL Ratio: 4
Triglycerides: 164 mg/dL — ABNORMAL HIGH (ref 0.0–149.0)
VLDL: 32.8 mg/dL (ref 0.0–40.0)

## 2020-07-20 LAB — COMPREHENSIVE METABOLIC PANEL
ALT: 25 U/L (ref 0–53)
AST: 18 U/L (ref 0–37)
Albumin: 4.4 g/dL (ref 3.5–5.2)
Alkaline Phosphatase: 89 U/L (ref 39–117)
BUN: 23 mg/dL (ref 6–23)
CO2: 26 mEq/L (ref 19–32)
Calcium: 9.5 mg/dL (ref 8.4–10.5)
Chloride: 103 mEq/L (ref 96–112)
Creatinine, Ser: 1.07 mg/dL (ref 0.40–1.50)
GFR: 71.41 mL/min (ref >60.00)
Glucose, Bld: 161 mg/dL — ABNORMAL HIGH (ref 70–99)
Potassium: 4.2 mEq/L (ref 3.5–5.1)
Sodium: 137 mEq/L (ref 135–145)
Total Bilirubin: 1.6 mg/dL — ABNORMAL HIGH (ref 0.2–1.2)
Total Protein: 6.8 g/dL (ref 6.0–8.3)

## 2020-07-20 LAB — PSA: PSA: 2.91 ng/mL (ref 0.10–4.00)

## 2020-07-20 LAB — HEMOGLOBIN A1C: Hgb A1c MFr Bld: 7.5 % — ABNORMAL HIGH (ref 4.6–6.5)

## 2020-07-21 ENCOUNTER — Encounter: Payer: Self-pay | Admitting: Physician Assistant

## 2020-07-21 ENCOUNTER — Ambulatory Visit: Payer: PPO | Admitting: Physician Assistant

## 2020-07-21 VITALS — BP 118/80 | HR 57 | Ht 71.0 in | Wt 185.2 lb

## 2020-07-21 DIAGNOSIS — I2510 Atherosclerotic heart disease of native coronary artery without angina pectoris: Secondary | ICD-10-CM | POA: Diagnosis not present

## 2020-07-21 DIAGNOSIS — E782 Mixed hyperlipidemia: Secondary | ICD-10-CM

## 2020-07-21 DIAGNOSIS — E119 Type 2 diabetes mellitus without complications: Secondary | ICD-10-CM | POA: Diagnosis not present

## 2020-07-21 DIAGNOSIS — I10 Essential (primary) hypertension: Secondary | ICD-10-CM

## 2020-07-21 MED ORDER — IRBESARTAN 300 MG PO TABS
300.0000 mg | ORAL_TABLET | Freq: Every day | ORAL | 3 refills | Status: DC
Start: 2020-07-21 — End: 2021-07-20

## 2020-07-21 MED ORDER — METOPROLOL SUCCINATE ER 50 MG PO TB24
50.0000 mg | ORAL_TABLET | Freq: Every day | ORAL | 3 refills | Status: DC
Start: 2020-07-21 — End: 2021-07-19

## 2020-07-21 MED ORDER — AMLODIPINE BESYLATE 5 MG PO TABS
5.0000 mg | ORAL_TABLET | Freq: Every day | ORAL | 3 refills | Status: DC
Start: 2020-07-21 — End: 2021-07-19

## 2020-07-21 NOTE — Patient Instructions (Signed)

## 2020-07-21 NOTE — Progress Notes (Signed)
Cardiology Office Note:    Date:  07/21/2020   ID:  Jeff Lopez, DOB February 27, 1951, MRN [id number]  PCP:  Caren Macadam, MD  [facility] Cardiologist:  Sherren Mocha, MD  [facility] Electrophysiologist:  None   Chief Complaint: yearly follow up for CAD  History of Present Illness:    Jeff Lopez is a 70 y.o. male with a hx of  nonobstructive coronary artery disease, hypertension, COPD, diabetes, HTN, prior smoking (quit 2009)  and hyperlipidemia presents for follow up.   Cath  2007: oLAD 20-30%, mLAD 50%, pCFX 20-30%, oAVCFX 20-30%, L renal art 50%;  normal LVF.  Echo 08/2015: Normal LV systolic function; grade 1 diastolic dysfunction; mild AI; mild LAE.  Stress test 03/2018: Low risk study. Normal perfusion.   Last seen by Dr. Burt Knack 06/2019.   Here for follow-up.  Prior smoker.  Previously on CPAP for sleep apnea but did not tolerated.  He also has chronic sinus issue and has been followed by ENT.  He has lost 20 pounds in past 1 to keep up with low weight.  He does exercise by walking 3 to 5 miles every day.  No exertional limitation.  Denies orthopnea, PND, syncope, lower extremity edema or melena.    Past Medical History:  Diagnosis Date  . Allergy    per pt  . CAD, NATIVE VESSEL 04/19/2010   nonobstructive by cath 2007:  oLAD 20-30%, mLAD 50%, pCFX 20-30%, oAVCFX 20-30%, L renal art 50%;  normal LVF  . Cataract    bil cataracts removed  . COPD (chronic obstructive pulmonary disease) ([place])   . Diabetes mellitus without complication ([place])   . GERD 04/09/2007  . Headache(784.0)   . HYPERLIPIDEMIA 04/09/2007   Patient denies.  [name] HYPERTENSION, BENIGN 04/19/2010  . Hypothyroidism   . Hypothyroidism    previous hyperthyroidism, s/p I-131  . LUMBAR DISC DISORDER 05/27/2010  . Nerve damage    Left leg  . OSTEOARTHRITIS, LUMBAR SPINE 04/09/2007  . RENAL CYST 05/27/2010  . Spinal headache    After having back surgery in 2014    Past Surgical History:   Procedure Laterality Date  . BACK SURGERY  2012,2014  . CARDIAC CATHETERIZATION  2007   Dr Loanne Drilling  . CHOLECYSTECTOMY    . CHOLECYSTECTOMY N/A 07/13/2013   Procedure: LAPAROSCOPIC CHOLECYSTECTOMY WITH INTRAOPERATIVE CHOLANGIOGRAM;  Surgeon: Shann Medal, MD;  Location: [facility];  Service: General;  Laterality: N/A;  . COLONOSCOPY  2018  . COLONOSCOPY W/ POLYPECTOMY    . ERCP N/A 07/14/2013   Procedure: ENDOSCOPIC RETROGRADE CHOLANGIOPANCREATOGRAPHY (ERCP);  Surgeon: [name], MD;  Location: [facility];  Service: Endoscopy;  Laterality: N/A;  . EYE SURGERY Bilateral    Cataract removal  . FOOT FRACTURE SURGERY    . FRACTURE SURGERY    . LUMBAR DISC SURGERY  08/06/2012   L3 & L4  . LUMBAR DISC SURGERY  11/11/2015   L1 L2    DR NITKA  . LUMBAR FUSION N/A 04/20/2015   Procedure: T12 to L1 fusion (Extension of Previous Fusion L2-S1 to T12-S1), Right Transforaminal lumbar interbody fusion, Posterior Fusion T12 to L1, with Pedicle screws, allograft, local bone graft, and  Vivigen;  Surgeon: [name], MD;  Location: [place];  Service: Orthopedics;  Laterality: N/A;  . LUMBAR LAMINECTOMY N/A 11/10/2013   Procedure: Left L1-2 far lateral approach to excise herniated nucleus pulposus;  Surgeon: [name], MD;  Location: [place];  Service: Orthopedics;  Laterality: N/A;  . LUMBAR LAMINECTOMY/DECOMPRESSION MICRODISCECTOMY Left 08/10/2012   Procedure: Dura Repair Left Side L2-L3;  Surgeon: [name], MD;  Location: [place];  Service: Orthopedics;  Laterality: Left;  Wilson Frame, Sliding table, dura repair kit, microscope  . NECK SURGERY     X 2  . SPINE SURGERY  2006   C-spine surgery x 2  . UPPER GASTROINTESTINAL ENDOSCOPY      Current Medications:  Prior to Admission medications   Medication Sig Start Date End Date Taking? Authorizing Provider  cholecalciferol (VITAMIN D3) 25 MCG (1000 UT) tablet Take 1,000 Units by mouth daily.   Yes [provider]  levothyroxine  (SYNTHROID) 137 MCG tablet TAKE 1 TABLET BY MOUTH EVERY DAY BEFORE breakfast 06/21/20  Yes Koberlein, Junell C, MD  metFORMIN (GLUCOPHAGE-XR) 500 MG 24 hr tablet TAKE 1 TABLET BY MOUTH EVERY DAY WITH BREAKFAST 06/30/20  Yes Koberlein, Junell C, MD  pantoprazole (PROTONIX) 40 MG tablet TAKE 1 TABLET BY MOUTH 2 TIMES DAILY 06/30/20  Yes [name], MD  amLODipine (NORVASC) 5 MG tablet Take 1 tablet (5 mg total) by mouth daily. 07/21/20   Kaivon Livesey, Crista Luria, PA  irbesartan (AVAPRO) 300 MG tablet Take 1 tablet (300 mg total) by mouth daily. 07/21/20   Leanor Kail, PA  metoprolol succinate (TOPROL-XL) 50 MG 24 hr tablet Take 1 tablet (50 mg total) by mouth daily. Take with or immediately following a meal. 07/21/20   Sabra Sessler, [place], PA    Allergies:   Ceftin, Cefuroxime axetil, and Cephalexin   Social History   Socioeconomic History  . Marital status: Single    Spouse name: Not on file  . Number of children: 1  . Years of education: Not on file  . Highest education level: Not on file  Occupational History  . 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 Smoking status: Former Smoker    Packs/day: 1.00    Years: 37.00    Pack years: 37.00    Types: Cigarettes    Quit date: 04/06/2008    Years since quitting: 12.2  . Smokeless tobacco: Never Used  . Tobacco comment: pt has stopped smoking about 9 months now  Vaping Use  . Vaping Use: Never used  Substance and Sexual Activity  . Alcohol use: No  . Drug use: No  . Sexual activity: Not Currently  Other Topics Concern  . Not on file  Social History Narrative   Works in Psychologist, educational. Retired.   Lives alone, has one child in [place].    Social Determinants of Health   Financial Resource Strain: Low Risk   . Difficulty of Paying Living Expenses: Not hard at all  Food Insecurity: No Food Insecurity  . Worried About Charity fundraiser in the Last Year: Never true  . Ran Out of Food in  the Last Year: Never true  Transportation Needs: No Transportation Needs  . Lack of Transportation (Medical): No  . Lack of Transportation (Non-Medical): No  Physical Activity: Inactive  . Days of Exercise per Week: 0 days  . Minutes of Exercise per Session: 0 min  Stress: No Stress Concern Present  . Feeling of Stress : Only a little  Social Connections: Socially Isolated  . Frequency of Communication with Friends and Family: More than three times a week  . Frequency of Social Gatherings with Friends and Family: Once a week  . Attends Religious Services: Never  . Active Member  of Clubs or Organizations: No  . Attends Archivist Meetings: Never  . Marital Status: Widowed     Family History: [name] family history includes Breast cancer in his mother; Heart attack (age of onset: 2) in his mother; Heart disease in his mother; Hypertension in his brother; Non-Hodgkin's lymphoma in his brother; Prostate cancer in his father; Stomach cancer in his paternal grandmother; Uterine cancer in his mother. There is no history of Thyroid disease, Colon cancer, Esophageal cancer, Rectal cancer, Pancreatic cancer, or Colon polyps.    ROS:   Please see the history of present illness.    All other systems reviewed and are negative.   EKGs/Labs/Other Studies Reviewed:    The following studies were reviewed today:  Stress test 03/2018  he left ventricular ejection fraction is mildly decreased (45-54%).  Nuclear stress EF: 52%.  No T wave inversion was noted during stress.  There was no ST segment deviation noted during stress.  This is a low risk study.   Normal perfusion. LVEF 52% with normal wall motion. This is a low risk study.  Stress test 08/2015 Study Conclusions   - Left ventricle: The cavity size was normal. Wall thickness was  normal. Systolic function was normal. The estimated ejection  fraction was in the range of 55% to 60%. Wall motion was normal;   there were no regional wall motion abnormalities. Doppler  parameters are consistent with abnormal left ventricular  relaxation (grade 1 diastolic dysfunction).  - Aortic valve: There was mild regurgitation.  - Mitral valve: Calcified annulus.  - Left atrium: The atrium was mildly dilated.   Impressions:   - Normal LV systolic function; grade 1 diastolic dysfunction; mild  AI; mild LAE.    EKG:  EKG is ordered today.  The ekg ordered today demonstrates sinus bradycardia Rate of 57 bpm  Recent Labs: 01/19/2020: Hemoglobin 15.4; Platelets 183 04/23/2020: TSH 3.38 07/20/2020: ALT 25; BUN 23; Creatinine, Ser 1.07; Potassium 4.2; Sodium 137  Recent Lipid Panel    Component Value Date/Time   CHOL 147 07/20/2020 0847   TRIG 164.0 (H) 07/20/2020 0847   HDL 40.50 07/20/2020 0847   CHOLHDL 4 07/20/2020 0847   VLDL 32.8 07/20/2020 0847   LDLCALC 74 07/20/2020 0847   LDLCALC 75 01/19/2020 0953   LDLDIRECT 115.6 11/28/2012 0947     Physical Exam:    VS:  BP 118/80   Pulse (!) 57   Ht '5\' 11"'  (1.803 m)   Wt 185 lb 3.2 oz (84 kg)   SpO2 97%   BMI 25.83 kg/m     Wt Readings from Last 3 Encounters:  07/21/20 185 lb 3.2 oz (84 kg)  05/28/20 185 lb 9 oz (84.2 kg)  05/12/20 189 lb 6.4 oz (85.9 kg)     GEN: Well nourished, well developed in no acute distress HEENT: Normal NECK: No JVD; No carotid bruits LYMPHATICS: No lymphadenopathy CARDIAC: RRR, no murmurs, rubs, gallops RESPIRATORY:  Clear to auscultation without rales, wheezing or rhonchi  ABDOMEN: Soft, non-tender, non-distended MUSCULOSKELETAL:  No edema; No deformity  SKIN: Warm and dry NEUROLOGIC:  Alert and oriented x 3 PSYCHIATRIC:  Normal affect   ASSESSMENT AND PLAN:    1. Nonobstructive CAD No anginal symptoms.  Continue aspirin and beta-blocker.  He is not on statin due to excellently rate control cholesterol.  Last LDL was 75 on July 2021.  2.  Hypertension -Blood pressure well controlled on  current medication.  No change.  3.  Diabetes mellitus -On Metformin.  Managed by PCP.  Medication Adjustments/Labs and Tests Ordered: Current medicines are reviewed at length with the patient today.  Concerns regarding medicines are outlined above.  Orders Placed This Encounter  Procedures  . EKG 12-Lead   Meds ordered this encounter  Medications  . amLODipine (NORVASC) 5 MG tablet    Sig: Take 1 tablet (5 mg total) by mouth daily.    Dispense:  90 tablet    Refill:  3  . irbesartan (AVAPRO) 300 MG tablet    Sig: Take 1 tablet (300 mg total) by mouth daily.    Dispense:  90 tablet    Refill:  3  . metoprolol succinate (TOPROL-XL) 50 MG 24 hr tablet    Sig: Take 1 tablet (50 mg total) by mouth daily. Take with or immediately following a meal.    Dispense:  90 tablet    Refill:  3    Patient Instructions  Medication Instructions:  Your physician recommends that you continue on your current medications as directed. Please refer to the Current Medication list given to you today.  *If you need a refill on your cardiac medications before your next appointment, please call your pharmacy*   Lab Work: None ordered  If you have labs (blood work) drawn today and your tests are completely normal, you will receive your results only by: [name] MyChart Message (if you have MyChart) OR . A paper copy in the mail If you have any lab test that is abnormal or we need to change your treatment, we will call you to review the results.   Testing/Procedures: None ordered   Follow-Up: At [facility], you and your health needs are our priority.  As part of our continuing mission to provide you with exceptional heart care, we have created designated Provider Care Teams.  These Care Teams include your primary Cardiologist (physician) and Advanced Practice Providers (APPs -  Physician Assistants and Nurse Practitioners) who all work together to provide you with the care you need, when you need  it.  We recommend signing up for the patient portal called "MyChart".  Sign up information is provided on this After Visit Summary.  MyChart is used to connect with patients for Virtual Visits (Telemedicine).  Patients are able to view lab/test results, encounter notes, upcoming appointments, etc.  Non-urgent messages can be sent to your provider as well.   To learn more about what you can do with MyChart, go to [email or web address].    Your next appointment:   12 month(s)  The format for your next appointment:   In Person  Provider:   You may see Sherren Mocha, MD or one of the following Advanced Practice Providers on your designated Care Team:    Richardson Dopp, PA-C  Robbie Lis, PA-C    Other Instructions      Signed, Leanor Kail, [place]  07/21/2020 10:31 AM    [place]

## 2020-07-22 ENCOUNTER — Other Ambulatory Visit: Payer: Self-pay | Admitting: Family Medicine

## 2020-08-03 ENCOUNTER — Ambulatory Visit: Payer: PPO | Admitting: Pulmonary Disease

## 2020-08-04 ENCOUNTER — Other Ambulatory Visit: Payer: Self-pay

## 2020-08-04 ENCOUNTER — Ambulatory Visit (INDEPENDENT_AMBULATORY_CARE_PROVIDER_SITE_OTHER): Payer: PPO | Admitting: Family Medicine

## 2020-08-04 ENCOUNTER — Encounter: Payer: Self-pay | Admitting: Family Medicine

## 2020-08-04 VITALS — BP 100/60 | HR 62 | Temp 97.6°F | Ht 71.0 in | Wt 190.4 lb

## 2020-08-04 DIAGNOSIS — E890 Postprocedural hypothyroidism: Secondary | ICD-10-CM

## 2020-08-04 DIAGNOSIS — E785 Hyperlipidemia, unspecified: Secondary | ICD-10-CM | POA: Diagnosis not present

## 2020-08-04 DIAGNOSIS — J310 Chronic rhinitis: Secondary | ICD-10-CM

## 2020-08-04 DIAGNOSIS — E1143 Type 2 diabetes mellitus with diabetic autonomic (poly)neuropathy: Secondary | ICD-10-CM | POA: Diagnosis not present

## 2020-08-04 DIAGNOSIS — E1169 Type 2 diabetes mellitus with other specified complication: Secondary | ICD-10-CM

## 2020-08-04 DIAGNOSIS — R7989 Other specified abnormal findings of blood chemistry: Secondary | ICD-10-CM | POA: Diagnosis not present

## 2020-08-04 DIAGNOSIS — I10 Essential (primary) hypertension: Secondary | ICD-10-CM

## 2020-08-04 NOTE — Addendum Note (Signed)
Addended by: Caren Macadam on: 08/04/2020 10:38 AM   Modules accepted: Orders

## 2020-08-04 NOTE — Patient Instructions (Signed)
I would encourage you to work on checking home pressures and record these. Let me know if regularly running less than 974 systolic or 65 diastolic.

## 2020-08-04 NOTE — Progress Notes (Signed)
Jeff Lopez DOB: Nov 02, 1950 Encounter date: 08/04/2020  This is a 70 y.o. male who presents with Chief Complaint  Patient presents with  . Follow-up    History of present illness: *Type 2 diabetes: A1c was 7.5 this time. Frustrated with increase in A1C. Hasn't been walking at all (was doing 3-5 miles/day). Doesn't walk in the cold weather. Taking Metformin 500 mg daily. He is eating grilled chicken breast nightly. Getting tired of this. He is drinking gatorade which has helped with leg cramping, but thinks this contributes to sugars.   Noting aches and pains. Still in process of cleaning out father's home to sell. Thinks doing some things in this process that he shouldn't be doing. Just notes more aches/pains with this.   *Hypertension:irbesartan 300 mg, toprol 50 mg, amlodipine 5 mg.not checking bp at home. Doesn't have cuff.   *Hyperlipidemia: does not want to be on statin. We have discussed benefits of medication, esp with dx of diabetes.  *Hypothyroid: synthroid 141mcg daily  *GERD: Protonix 40 mg twice daily  Had sleep apnea eval; wasn't able to tolerate the machine. He sleeps well with lunesta and sleeps all night. Does feel a little drowsy in the morning.   Allergies  Allergen Reactions  . Ceftin Other (See Comments)    Patient stated it caused "sores in mouth" Thrush  . Cefuroxime Axetil Other (See Comments)    Sores in mouth Sores in mouth Patient stated it caused "sores in mouth" Thrush other  . Cephalexin Other (See Comments)    Other   Current Meds  Medication Sig  . amLODipine (NORVASC) 5 MG tablet Take 1 tablet (5 mg total) by mouth daily.  . cholecalciferol (VITAMIN D3) 25 MCG (1000 UT) tablet Take 1,000 Units by mouth daily.  . Eszopiclone 3 MG TABS TAKE 1 TABLET BY MOUTH immediately BEFORE bedtime AS NEEDED  . irbesartan (AVAPRO) 300 MG tablet Take 1 tablet (300 mg total) by mouth daily.  [name] levothyroxine (SYNTHROID) 137 MCG tablet TAKE 1 TABLET BY  MOUTH EVERY DAY BEFORE breakfast  . metFORMIN (GLUCOPHAGE-XR) 500 MG 24 hr tablet TAKE 1 TABLET BY MOUTH EVERY DAY WITH BREAKFAST  . metoprolol succinate (TOPROL-XL) 50 MG 24 hr tablet Take 1 tablet (50 mg total) by mouth daily. Take with or immediately following a meal.  . pantoprazole (PROTONIX) 40 MG tablet TAKE 1 TABLET BY MOUTH 2 TIMES DAILY    Review of Systems  Constitutional: Negative for chills, fatigue and fever.  Respiratory: Negative for cough, chest tightness, shortness of breath and wheezing.   Cardiovascular: Negative for chest pain, palpitations and leg swelling.    Objective:  BP 100/60 (BP Location: Right Arm, Patient Position: Sitting, Cuff Size: Normal)   Pulse 62   Temp 97.6 F (36.4 C) (Oral)   Ht 5\' 11"  (1.803 m)   Wt 190 lb 6.4 oz (86.4 kg)   SpO2 94%   BMI 26.56 kg/m   Weight: 190 lb 6.4 oz (86.4 kg)   BP Readings from Last 3 Encounters:  08/04/20 100/60  07/21/20 118/80  05/28/20 140/80   Wt Readings from Last 3 Encounters:  08/04/20 190 lb 6.4 oz (86.4 kg)  07/21/20 185 lb 3.2 oz (84 kg)  05/28/20 185 lb 9 oz (84.2 kg)    Physical Exam Constitutional:      General: He is not in acute distress.    Appearance: He is well-developed.  Cardiovascular:     Rate and Rhythm: Normal rate and regular rhythm.  Heart sounds: Normal heart sounds. No murmur heard. No friction rub.  Pulmonary:     Effort: Pulmonary effort is normal. No respiratory distress.     Breath sounds: Normal breath sounds. No wheezing or rales.  Musculoskeletal:     Right lower leg: No edema.     Left lower leg: No edema.  Neurological:     Mental Status: He is alert and oriented to person, place, and time.  Psychiatric:        Behavior: Behavior normal.     Assessment/Plan  1. HYPERTENSION, BENIGN Running on low end today; recommend checking at home and reporting back to possibly adjust medication.  - CBC with Differential/Platelet; Future - Comprehensive metabolic  panel; Future  2. Hyperlipidemia associated with type 2 diabetes mellitus ([place]) Does not want to start statin.  - Lipid panel; Future  3. Type II diabetes mellitus with peripheral autonomic neuropathy (HCC) Metformin 500mg  daily; restart walking.   - Hemoglobin A1c; Future - Microalbumin / creatinine urine ratio; Future  4. Hypothyroidism following radioiodine therapy Cont with synthroid 181mcg daily. - TSH; Future  5. Low serum vitamin D - VITAMIN D 25 Hydroxy (Vit-D Deficiency, Fractures); Future  6. Hemochromatosis, unspecified hemochromatosis type Will recheck prior to next visit. - Iron, TIBC and Ferritin Panel; Future    Return in about 6 months (around 02/01/2021) for physical exam.    Micheline Rough, MD

## 2020-08-23 NOTE — Progress Notes (Signed)
New Patient Note  RE: Jeff Lopez MRN: [id number] DOB: [birth year] Date of Office Visit: 08/24/2020  Referring provider: Caren Macadam, MD Primary care provider: Caren Macadam, MD  Chief Complaint: Allergic Rhinitis  and Sinus Problem  History of Present Illness: I had the pleasure of seeing Jeff Lopez for initial evaluation at [facility] on 08/24/2020. He is a 70 y.o. male, who is referred here by Caren Macadam, MD for the evaluation of rhinitis.  He reports symptoms of rhinorrhea, sneezing, itchy nose, itchy ears/face, nasal congestion, itchy/watery eyes. Symptoms have been going on for 5 years. The symptoms are present all year around. Other triggers include exposure to unsure. Anosmia: no. Headache: sometimes. He has used Zyrtec, Claritin, Allegra, Flonase, Nasacort, Benadryl with minimal improvement in symptoms. Sinus infections: none. Previous work up includes: skin testing 3 years ago was positive to dust mites. No prior allergy injections.  Previous ENT evaluation: yes and tried Flonase, Nasacort.  Patient had sinus surgery 5 years ago and since then he has been having issues with his sinuses.  Previous sinus imaging: not recently. History of nasal polyps: no. Last eye exam: annually. History of reflux: and takes Protonix daily.  Assessment and Plan: Trason is a 70 y.o. male with: Mixed allergic and non-allergic rhinitis Perennial rhinoconjunctivitis symptoms for the last 5 years after he had sinus surgery.  Tried Zyrtec, Claritin, Allegra, Flonase, Nasacort, Benadryl with minimal benefit.  No prior allergy injections. Unsure of exact triggers but strong scents worsen symptoms.   Today's skin testing showed: Positive to dust mites, mold, dog, cockroaches.  He most likely has mixed allergic and non-allergic rhinitis.   Start environmental control measures as below.  May use over the counter antihistamines such as Zyrtec  (cetirizine), Claritin (loratadine), Allegra (fexofenadine), or Xyzal (levocetirizine) daily as needed.  May use ipratropium nasal spray 1-2 sprays per nostril twice a day as needed for runny nose/drainage.  Take Nasonex 2 sprays per nostril at night for nasal congestion.   Nasal saline spray (i.e., Simply Saline) or nasal saline lavage (i.e., NeilMed) is recommended as needed and prior to medicated nasal sprays.  Read about allergy injections - handout given.   If no improvement will add on Singulair next and try azelastine nasal spray.   Pruritus Pruritis with associated rash at times on the arms. No specific triggers noted.  Today's skin testing showed: Positive to dust mites, mold, dog, cockroaches. Borderline to shellfish - eats a varied diet.   No need to avoid any foods at this time.   Take a daily antihistamine as above to control symptoms.   Continue proper skin care.   Return in about 3 months (around 11/24/2020).  Meds ordered this encounter  Medications  . ipratropium (ATROVENT) 0.03 % nasal spray    Sig: Place 1-2 sprays into both nostrils 2 (two) times daily as needed (runny nose).    Dispense:  30 mL    Refill:  5  . mometasone (NASONEX) 50 MCG/ACT nasal spray    Sig: Place 2 sprays into the nose daily. For nasal congestion    Dispense:  1 each    Refill:  5   Lab Orders  No laboratory test(s) ordered today    Other allergy screening: Asthma: no Food allergy: no Medication allergy: yes Hymenoptera allergy: no Urticaria: no Eczema:no History of recurrent infections suggestive of immunodeficency: no  Diagnostics: Skin Testing: Environmental allergy panel and select foods. Positive to  dust mites, mold, dog, cockroaches. Borderline to shellfish. Results discussed with patient/family.  Airborne Adult Perc - 08/24/20 1008    Time Antigen Placed 1008    Allergen Manufacturer Lavella Hammock    Location Back    Number of Test 59    1. Control-Buffer 50% Glycerol  2+    2. Control-Histamine 1 mg/ml Negative    3. Albumin saline Negative    4. [place] Negative    5. [place] Negative    6. Johnson Negative    7. Avery Blue Negative    8. Meadow Fescue Negative    9. Perennial Rye Negative    10. Sweet Vernal Negative    11. Timothy Negative    12. Cocklebur Negative    13. Burweed Marshelder Negative    14. Ragweed, short Negative    15. Ragweed, Giant Negative    16. Plantain,  English Negative    17. Lamb's Quarters Negative    18. Sheep Sorrell Negative    19. Rough Pigweed Negative    20. Marsh Elder, Rough Negative    21. Mugwort, Common Negative    22. Ash mix Negative    23. Birch mix Negative    24. Beech American Negative    25. Box, Elder Negative    26. Cedar, red Negative    28. Elm mix Negative    29. Hickory Negative    30. Maple mix Negative    31. Oak, [place] mix Negative    32. Pecan Pollen Negative    33. Pine mix Negative    35. Nicholson, Black Pollen Negative    36. Alternaria alternata 2+    37. Cladosporium Herbarum Negative    38. Aspergillus mix Negative    39. Penicillium mix Negative    40. Bipolaris sorokiniana (Helminthosporium) Negative    41. Drechslera spicifera (Curvularia) Negative    42. Mucor plumbeus Negative    43. Fusarium moniliforme Negative    44. Aureobasidium pullulans (pullulara) Negative    45. Rhizopus oryzae Negative    46. Botrytis cinera Negative    47. Epicoccum nigrum Negative    48. Phoma betae Negative    49. Candida Albicans Negative    50. Trichophyton mentagrophytes Negative    51. Mite, D Farinae  5,000 AU/ml 2+    52. Mite, D Pteronyssinus  5,000 AU/ml 2+    53. Cat Hair 10,000 BAU/ml Negative    54.  Dog Epithelia Negative    55. Mixed Feathers Negative    56. Horse Epithelia Negative    57. Cockroach, German Negative    58. Mouse Negative    59. Tobacco Leaf Negative          Food Perc - 08/24/20 1008      Test Information   Time Antigen Placed 1009     Allergen Manufacturer Lavella Hammock    Location Back    Number of allergen test 10      Food   1. Peanut Negative    2. Soybean food Negative    3. Wheat, whole Negative    4. Sesame Negative    5. Milk, cow Negative    6. Egg White, chicken Negative    7. Casein Negative    8. Shellfish mix --   +/-   9. Fish mix Negative    10. Cashew Negative          Intradermal - 08/24/20 1108    Time Antigen Placed 1108  Allergen Manufacturer Greer    Location Arm    Number of Test 13    Control Negative    Guatemala Negative    Johnson Negative    7 Grass Negative    Ragweed mix Negative    Weed mix Negative    Tree mix Negative    Mold 2 2+    Mold 3 2+    Mold 4 2+    Cat Negative    Dog 2+    Cockroach 3+           Past Medical History: Patient Active Problem List   Diagnosis Date Noted  . Pruritus 08/24/2020  . OSA (obstructive sleep apnea) 11/25/2018  . Non-restorative sleep 11/25/2018  . Malignant neoplasm of ascending colon ([place]) 05/16/2018  . Low testosterone 03/02/2018  . Gynecomastia 02/28/2018  . Left lower quadrant pain 12/26/2017  . Arthralgia 10/03/2017  . Numbness 10/03/2017  . Chronic pansinusitis 01/09/2017  . Deviated septum 01/09/2017  . Chronic left sacroiliac pain 10/19/2016  . Chronic left-sided low back pain with left-sided sciatica 07/31/2016  . Spinal stenosis, lumbar region, with neurogenic claudication 04/21/2015    Class: Chronic  . Spondylolisthesis of lumbar region 04/21/2015    Class: Chronic  . Hypothyroidism following radioiodine therapy 03/10/2015  . Screening for prostate cancer 03/10/2015  . Hemochromatosis 03/27/2014  . Choledocholithiasis 07/14/2013  . Nonspecific (abnormal) findings on radiological and other examination of biliary tract 07/13/2013  . Calculus of gallbladder with acute cholecystitis, without mention of obstruction 07/13/2013  . Calculus of bile duct without mention of cholecystitis or obstruction 07/13/2013  .  Abdominal pain 07/12/2013  . Neck pain on left side 05/30/2013  . Quit smoking 11/28/2012  . Postoperative CSF leak 08/10/2012    Class: Acute  . Degenerative disc disease, lumbar 08/06/2012    Class: Chronic  . HNP (herniated nucleus pulposus), lumbar 08/06/2012    Class: Acute  . Type II diabetes mellitus with peripheral autonomic neuropathy ([place]) 06/10/2012  . COPD GOLD 0 02/09/2012  . Chest pain at rest 02/06/2012  . Mixed allergic and non-allergic rhinitis 02/06/2012  . Radiculopathy of leg 11/05/2010  . RENAL CYST 05/27/2010  . LUMBAR DISC DISORDER 05/27/2010  . HYPERTENSION, BENIGN 04/19/2010  . CAD, NATIVE VESSEL 04/19/2010  . Hyperlipidemia associated with type 2 diabetes mellitus ([place]) 04/09/2007  . GERD 04/09/2007  . OSTEOARTHRITIS, LUMBAR SPINE 04/09/2007   Past Medical History:  Diagnosis Date  . Allergy    per pt  . CAD, NATIVE VESSEL 04/19/2010   nonobstructive by cath 2007:  oLAD 20-30%, mLAD 50%, pCFX 20-30%, oAVCFX 20-30%, L renal art 50%;  normal LVF  . Cataract    bil cataracts removed  . COPD (chronic obstructive pulmonary disease) ([place])   . Diabetes mellitus without complication ([place])   . GERD 04/09/2007  . Headache(784.0)   . HYPERLIPIDEMIA 04/09/2007   Patient denies.  [name] HYPERTENSION, BENIGN 04/19/2010  . Hypothyroidism   . Hypothyroidism    previous hyperthyroidism, s/p I-131  . LUMBAR DISC DISORDER 05/27/2010  . Nerve damage    Left leg  . OSTEOARTHRITIS, LUMBAR SPINE 04/09/2007  . RENAL CYST 05/27/2010  . Spinal headache    After having back surgery in 2014   Past Surgical History: Past Surgical History:  Procedure Laterality Date  . BACK SURGERY  2012,2014  . CARDIAC CATHETERIZATION  2007   Dr Loanne Drilling  . CHOLECYSTECTOMY    . CHOLECYSTECTOMY N/A 07/13/2013   Procedure: LAPAROSCOPIC CHOLECYSTECTOMY WITH  INTRAOPERATIVE CHOLANGIOGRAM;  Surgeon: Shann Medal, MD;  Location: [facility];  Service: General;  Laterality: N/A;  . COLONOSCOPY  2018   . COLONOSCOPY W/ POLYPECTOMY    . ERCP N/A 07/14/2013   Procedure: ENDOSCOPIC RETROGRADE CHOLANGIOPANCREATOGRAPHY (ERCP);  Surgeon: [name], MD;  Location: [facility];  Service: Endoscopy;  Laterality: N/A;  . EYE SURGERY Bilateral    Cataract removal  . FOOT FRACTURE SURGERY    . FRACTURE SURGERY    . LUMBAR DISC SURGERY  08/06/2012   L3 & L4  . LUMBAR DISC SURGERY  11/11/2015   L1 L2    DR NITKA  . LUMBAR FUSION N/A 04/20/2015   Procedure: T12 to L1 fusion (Extension of Previous Fusion L2-S1 to T12-S1), Right Transforaminal lumbar interbody fusion, Posterior Fusion T12 to L1, with Pedicle screws, allograft, local bone graft, and  Vivigen;  Surgeon: [name], MD;  Location: [facility];  Service: Orthopedics;  Laterality: N/A;  . LUMBAR LAMINECTOMY N/A 11/10/2013   Procedure: Left L1-2 far lateral approach to excise herniated nucleus pulposus;  Surgeon: [name], MD;  Location: [facility];  Service: Orthopedics;  Laterality: N/A;  . LUMBAR LAMINECTOMY/DECOMPRESSION MICRODISCECTOMY Left 08/10/2012   Procedure: Dura Repair Left Side L2-L3;  Surgeon: [name], MD;  Location: [facility];  Service: Orthopedics;  Laterality: Left;  Wilson Frame, Sliding table, dura repair kit, microscope  . NECK SURGERY     X 2  . SINOSCOPY    . SPINE SURGERY  2006   C-spine surgery x 2  . UPPER GASTROINTESTINAL ENDOSCOPY     Medication List:  Current Outpatient Medications  Medication Sig Dispense Refill  . amLODipine (NORVASC) 5 MG tablet Take 1 tablet (5 mg total) by mouth daily. 90 tablet 3  . cholecalciferol (VITAMIN D3) 25 MCG (1000 UT) tablet Take 1,000 Units by mouth daily.    . Eszopiclone 3 MG TABS TAKE 1 TABLET BY MOUTH immediately BEFORE bedtime AS NEEDED 90 tablet 1  . ipratropium (ATROVENT) 0.03 % nasal spray Place 1-2 sprays into both nostrils 2 (two) times daily as needed (runny nose). 30 mL 5  . irbesartan (AVAPRO) 300 MG tablet Take 1 tablet (300 mg total) by mouth daily. 90 tablet  3  . levothyroxine (SYNTHROID) 137 MCG tablet TAKE 1 TABLET BY MOUTH EVERY DAY BEFORE breakfast 30 tablet 1  . metFORMIN (GLUCOPHAGE-XR) 500 MG 24 hr tablet TAKE 1 TABLET BY MOUTH EVERY DAY WITH BREAKFAST 30 tablet 1  . metoprolol succinate (TOPROL-XL) 50 MG 24 hr tablet Take 1 tablet (50 mg total) by mouth daily. Take with or immediately following a meal. 90 tablet 3  . mometasone (NASONEX) 50 MCG/ACT nasal spray Place 2 sprays into the nose daily. For nasal congestion 1 each 5  . pantoprazole (PROTONIX) 40 MG tablet TAKE 1 TABLET BY MOUTH 2 TIMES DAILY 60 tablet 1  . clobetasol (TEMOVATE) 0.05 % external solution APPLY TO SCALP 2 TIMES DAILY FOR 14 DAYS     No current facility-administered medications for this visit.   Allergies: Allergies  Allergen Reactions  . Ceftin Other (See Comments)    Patient stated it caused "sores in mouth" Thrush  . Cefuroxime Axetil Other (See Comments)    Sores in mouth Sores in mouth Patient stated it caused "sores in mouth" Thrush other  . Cephalexin Other (See Comments)    Other   Social History: Social History   Socioeconomic History  . Marital status: Single  Spouse name: Not on file  . Number of children: 1  . Years of education: Not on file  . Highest education level: Not on file  Occupational History  . 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 Smoking status: Former Smoker    Packs/day: 1.00    Years: 37.00    Pack years: 37.00    Types: Cigarettes    Quit date: 04/06/2008    Years since quitting: 12.3  . Smokeless tobacco: Never Used  . Tobacco comment: pt has stopped smoking about 9 months now  Vaping Use  . Vaping Use: Never used  Substance and Sexual Activity  . Alcohol use: No  . Drug use: No  . Sexual activity: Not Currently  Other Topics Concern  . Not on file  Social History Narrative   Works in Psychologist, educational. Retired.   Lives alone, has one child in [place].    Social  Determinants of Health   Financial Resource Strain: Low Risk   . Difficulty of Paying Living Expenses: Not hard at all  Food Insecurity: No Food Insecurity  . Worried About Charity fundraiser in the Last Year: Never true  . Ran Out of Food in the Last Year: Never true  Transportation Needs: No Transportation Needs  . Lack of Transportation (Medical): No  . Lack of Transportation (Non-Medical): No  Physical Activity: Inactive  . Days of Exercise per Week: 0 days  . Minutes of Exercise per Session: 0 min  Stress: No Stress Concern Present  . Feeling of Stress : Only a little  Social Connections: Socially Isolated  . Frequency of Communication with Friends and Family: More than three times a week  . Frequency of Social Gatherings with Friends and Family: Once a week  . Attends Religious Services: Never  . Active Member of Clubs or Organizations: No  . Attends Archivist Meetings: Never  . Marital Status: Widowed   Lives in a 69 year old apartment. Smoking: quit 15 years ago Occupation: retired Runner, broadcasting/film/video History: Environmental education officer in the house: no Charity fundraiser in the family room: yes Carpet in the bedroom: yes Heating: electric Cooling: central Pet: no  Family History: Family History  Problem Relation Age of Onset  . Breast cancer Mother   . Uterine cancer Mother   . Heart disease Mother   . Heart attack Mother 73  . Prostate cancer Father   . Hypertension Brother   . Non-Hodgkin's lymphoma Brother   . Stomach cancer Paternal Grandmother   . Thyroid disease Neg Hx   . Colon cancer Neg Hx   . Esophageal cancer Neg Hx   . Rectal cancer Neg Hx   . Pancreatic cancer Neg Hx   . Colon polyps Neg Hx    Problem                               Relation Asthma                                   No  Eczema                                No  Food allergy  No  Allergic rhino conjunctivitis     No   Review of Systems  Constitutional:  Negative for appetite change, chills, fever and unexpected weight change.  HENT: Positive for congestion, rhinorrhea and sneezing.   Eyes: Positive for itching.  Respiratory: Negative for cough, chest tightness, shortness of breath and wheezing.   Cardiovascular: Negative for chest pain.  Gastrointestinal: Negative for abdominal pain.  Genitourinary: Negative for difficulty urinating.  Skin: Positive for rash.  Allergic/Immunologic: Positive for environmental allergies.  Neurological: Positive for headaches.   Objective: BP 120/82 (BP Location: Right Arm, Patient Position: Sitting, Cuff Size: Normal)   Pulse 61   Temp (!) 96.4 F (35.8 C) (Temporal)   Resp 17   Ht 5' 10.08" (1.78 m)   Wt 192 lb 6.4 oz (87.3 kg)   SpO2 95%   BMI 27.54 kg/m  Body mass index is 27.54 kg/m. Physical Exam Vitals and nursing note reviewed.  Constitutional:      Appearance: Normal appearance. He is well-developed.  HENT:     Head: Normocephalic and atraumatic.     Right Ear: External ear normal.     Left Ear: External ear normal.     Nose: Nose normal.     Mouth/Throat:     Mouth: Mucous membranes are moist.     Pharynx: Oropharynx is clear.  Eyes:     Conjunctiva/sclera: Conjunctivae normal.  Cardiovascular:     Rate and Rhythm: Normal rate and regular rhythm.     Heart sounds: Normal heart sounds. No murmur heard. No friction rub. No gallop.   Pulmonary:     Effort: Pulmonary effort is normal.     Breath sounds: Normal breath sounds. No wheezing, rhonchi or rales.  Abdominal:     Palpations: Abdomen is soft.  Musculoskeletal:     Cervical back: Neck supple.  Skin:    General: Skin is warm.     Findings: No rash.  Neurological:     Mental Status: He is alert and oriented to person, place, and time.  Psychiatric:        Behavior: Behavior normal.    The plan was reviewed with the patient/family, and all questions/concerned were addressed.  It was my pleasure to see Jeff Lopez today  and participate in his care. Please feel free to contact me with any questions or concerns.  Sincerely,  Rexene Alberts, DO Allergy & Immunology  [facility] office: [place] office: [phone]

## 2020-08-24 ENCOUNTER — Other Ambulatory Visit: Payer: Self-pay

## 2020-08-24 ENCOUNTER — Encounter: Payer: Self-pay | Admitting: Allergy

## 2020-08-24 ENCOUNTER — Ambulatory Visit: Payer: PPO | Admitting: Allergy

## 2020-08-24 VITALS — BP 120/82 | HR 61 | Temp 96.4°F | Resp 17 | Ht 70.08 in | Wt 192.4 lb

## 2020-08-24 DIAGNOSIS — J3089 Other allergic rhinitis: Secondary | ICD-10-CM | POA: Diagnosis not present

## 2020-08-24 DIAGNOSIS — L299 Pruritus, unspecified: Secondary | ICD-10-CM

## 2020-08-24 MED ORDER — MOMETASONE FUROATE 50 MCG/ACT NA SUSP: 2.0000 | Freq: Every day | NASAL | 5 refills | Status: DC

## 2020-08-24 MED ORDER — IPRATROPIUM BROMIDE 0.03 % NA SOLN: 1.0000 | Freq: Two times a day (BID) | NASAL | 5 refills | Status: DC | PRN

## 2020-08-24 NOTE — Assessment & Plan Note (Addendum)
Pruritis with associated rash at times on the arms. No specific triggers noted.  Today's skin testing showed: Positive to dust mites, mold, dog, cockroaches. Borderline to shellfish - eats a varied diet.   No need to avoid any foods at this time.   Take a daily antihistamine as above to control symptoms.   Continue proper skin care.

## 2020-08-24 NOTE — Patient Instructions (Addendum)
Today's skin testing showed: Positive to dust mites, mold, dog, cockroaches. Borderline to shellfish.  Environmental allergies  Start environmental control measures as below.  May use over the counter antihistamines such as Zyrtec (cetirizine), Claritin (loratadine), Allegra (fexofenadine), or Xyzal (levocetirizine) daily as needed.  May use ipratropium nasal spray 1-2 sprays per nostril twice a day as needed for runny nose/drainage.  Take Nasonex 2 sprays per nostril at night f  or nasal congestion.   Nasal saline spray (i.e., Simply Saline) or nasal saline lavage (i.e., NeilMed) is recommended as needed and prior to medicated nasal sprays.  Read about allergy injections - handout given.   Follow up in 3 months or sooner if needed.   Control of House Dust Mite Allergen . Dust mite allergens are a common trigger of allergy and asthma symptoms. While they can be found throughout the house, these microscopic creatures thrive in warm, humid environments such as bedding, upholstered furniture and carpeting. . Because so much time is spent in the bedroom, it is essential to reduce mite levels there.  . Encase pillows, mattresses, and box springs in special allergen-proof fabric covers or airtight, zippered plastic covers.  . Bedding should be washed weekly in hot water (130 F) and dried in a hot dryer. Allergen-proof covers are available for comforters and pillows that can't be regularly washed.  [name] the allergy-proof covers every few months. Minimize clutter in the bedroom. Keep pets out of the bedroom.  [name] Keep humidity less than 50% by using a dehumidifier or air conditioning. You can buy a humidity measuring device called a hygrometer to monitor this.  . If possible, replace carpets with hardwood, linoleum, or washable area rugs. If that's not possible, vacuum frequently with a vacuum that has a HEPA filter. . Remove all upholstered furniture and non-washable window drapes from the  bedroom. . Remove all non-washable stuffed toys from the bedroom.  Wash stuffed toys weekly.  Mold Control . Mold and fungi can grow on a variety of surfaces provided certain temperature and moisture conditions exist.  . Outdoor molds grow on plants, decaying vegetation and soil. The major outdoor mold, Alternaria and Cladosporium, are found in very high numbers during hot and dry conditions. Generally, a late summer - fall peak is seen for common outdoor fungal spores. Rain will temporarily lower outdoor mold spore count, but counts rise rapidly when the rainy period ends. . The most important indoor molds are Aspergillus and Penicillium. Dark, humid and poorly ventilated basements are ideal sites for mold growth. The next most common sites of mold growth are the bathroom and the kitchen. Outdoor (Seasonal) Mold Control . Use air conditioning and keep windows closed. . Avoid exposure to decaying vegetation. [name] Avoid leaf raking. . Avoid grain handling. . Consider wearing a face mask if working in moldy areas.  Indoor (Perennial) Mold Control  . Maintain humidity below 50%. . Get rid of mold growth on hard surfaces with water, detergent and, if necessary, 5% bleach (do not mix with other cleaners). Then dry the area completely. If mold covers an area more than 10 square feet, consider hiring an indoor environmental professional. . For clothing, washing with soap and water is best. If moldy items cannot be cleaned and dried, throw them away. . Remove sources e.g. contaminated carpets. . Repair and seal leaking roofs or pipes. Using dehumidifiers in damp basements may be helpful, but empty the water and clean units regularly to prevent mildew from forming. All rooms, especially  basements, bathrooms and kitchens, require ventilation and cleaning to deter mold and mildew growth. Avoid carpeting on concrete or damp floors, and storing items in damp areas.   Cockroach Allergen Avoidance Cockroaches are  often found in the homes of densely populated urban areas, schools or commercial buildings, but these creatures can lurk almost anywhere. This does not mean that you have a dirty house or living area. . Block all areas where roaches can enter the home. This includes crevices, wall cracks and windows.  . Cockroaches need water to survive, so fix and seal all leaky faucets and pipes. Have an exterminator go through the house when your family and pets are gone to eliminate any remaining roaches. [name] Keep food in lidded containers and put pet food dishes away after your pets are done eating. Vacuum and sweep the floor after meals, and take out garbage and recyclables. Use lidded garbage containers in the kitchen. Wash dishes immediately after use and clean under stoves, refrigerators or toasters where crumbs can accumulate. Wipe off the stove and other kitchen surfaces and cupboards regularly. Pet Allergen Avoidance: . Contrary to popular opinion, there are no "hypoallergenic" breeds of dogs or cats. That is because people are not allergic to an animal's hair, but to an allergen found in the animal's saliva, dander (dead skin flakes) or urine. Pet allergy symptoms typically occur within minutes. For some people, symptoms can build up and become most severe 8 to 12 hours after contact with the animal. People with severe allergies can experience reactions in public places if dander has been transported on the pet owners' clothing. [name] Keeping an animal outdoors is only a partial solution, since homes with pets in the yard still have higher concentrations of animal allergens. . Before getting a pet, ask your allergist to determine if you are allergic to animals. If your pet is already considered part of your family, try to minimize contact and keep the pet out of the bedroom and other rooms where you spend a great deal of time. . As with dust mites, vacuum carpets often or replace carpet with a hardwood floor, tile or  linoleum. . High-efficiency particulate air (HEPA) cleaners can reduce allergen levels over time. . While dander and saliva are the source of cat and dog allergens, urine is the source of allergens from rabbits, hamsters, mice and [place] pigs; so ask a non-allergic family member to clean the animal's cage. . If you have a pet allergy, talk to your allergist about the potential for allergy immunotherapy (allergy shots). This strategy can often provide long-term relief.   Buffered Isotonic Saline Irrigations:  Goal: . When you irrigate with the isotonic saline (salt water) it washes mucous and other debris from your nose that could be contributing to your nasal symptoms.   Recipe: [name] Obtain 1 quart jar that is clean . Fill with clean (bottled, boiled or distilled) water . Add 1-2 heaping teaspoons of salt without iodine o If the solution with 2 teaspoons of salt is too strong, adjust the amount down until better tolerated . Add 1 teaspoon of [employer] baking soda (pure bicarbonate) . Mix ingredients together and store at room temperature and discard after 1 week * Alternatively you can buy pre made salt packets for the NeilMed bottle or there          are other over the counter brands available  Instructions: . Warm  cup of the solution in the microwave if desired but be careful  not to overheat as this will burn the inside of your nose . Stand over a sink (or do it while you shower) and squirt the solution into one side of your nose aiming towards the back of your head o Sometimes saying "coca cola" while irrigating can be helpful to prevent fluid from going down your throat  . The solution will travel to the back of your nose and then come out the other side . Perform this again on the other side . Try to do this twice a day . If you are using a nasal spray in addition to the irrigation, irrigate first and then use the topical nasal spray otherwise you will wash the nasal spray out of  your nose

## 2020-08-24 NOTE — Assessment & Plan Note (Addendum)
Perennial rhinoconjunctivitis symptoms for the last 5 years after he had sinus surgery.  Tried Zyrtec, Claritin, Allegra, Flonase, Nasacort, Benadryl with minimal benefit.  No prior allergy injections. Unsure of exact triggers but strong scents worsen symptoms.   Today's skin testing showed: Positive to dust mites, mold, dog, cockroaches.  He most likely has mixed allergic and non-allergic rhinitis.   Start environmental control measures as below.  May use over the counter antihistamines such as Zyrtec (cetirizine), Claritin (loratadine), Allegra (fexofenadine), or Xyzal (levocetirizine) daily as needed.  May use ipratropium nasal spray 1-2 sprays per nostril twice a day as needed for runny nose/drainage.  Take Nasonex 2 sprays per nostril at night for nasal congestion.   Nasal saline spray (i.e., Simply Saline) or nasal saline lavage (i.e., NeilMed) is recommended as needed and prior to medicated nasal sprays.  Read about allergy injections - handout given.   If no improvement will add on Singulair next and try azelastine nasal spray.

## 2020-09-13 ENCOUNTER — Other Ambulatory Visit: Payer: Self-pay | Admitting: Gastroenterology

## 2020-09-13 ENCOUNTER — Other Ambulatory Visit: Payer: Self-pay | Admitting: Family Medicine

## 2020-09-13 DIAGNOSIS — E890 Postprocedural hypothyroidism: Secondary | ICD-10-CM

## 2020-09-13 DIAGNOSIS — E1169 Type 2 diabetes mellitus with other specified complication: Secondary | ICD-10-CM

## 2020-09-23 ENCOUNTER — Ambulatory Visit: Payer: PPO | Admitting: Pulmonary Disease

## 2020-09-28 ENCOUNTER — Other Ambulatory Visit: Payer: Self-pay | Admitting: Gastroenterology

## 2020-09-30 DIAGNOSIS — C44519 Basal cell carcinoma of skin of other part of trunk: Secondary | ICD-10-CM | POA: Diagnosis not present

## 2020-10-20 DIAGNOSIS — E119 Type 2 diabetes mellitus without complications: Secondary | ICD-10-CM | POA: Diagnosis not present

## 2020-10-25 ENCOUNTER — Other Ambulatory Visit: Payer: Self-pay | Admitting: Family Medicine

## 2020-11-03 ENCOUNTER — Other Ambulatory Visit: Payer: Self-pay | Admitting: Gastroenterology

## 2020-11-18 ENCOUNTER — Telehealth: Payer: Self-pay | Admitting: Pharmacist

## 2020-11-18 NOTE — Chronic Care Management (AMB) (Signed)
Error

## 2020-11-23 DIAGNOSIS — Z85828 Personal history of other malignant neoplasm of skin: Secondary | ICD-10-CM | POA: Diagnosis not present

## 2020-11-23 DIAGNOSIS — L821 Other seborrheic keratosis: Secondary | ICD-10-CM | POA: Diagnosis not present

## 2020-11-23 DIAGNOSIS — L570 Actinic keratosis: Secondary | ICD-10-CM | POA: Diagnosis not present

## 2020-11-23 DIAGNOSIS — L578 Other skin changes due to chronic exposure to nonionizing radiation: Secondary | ICD-10-CM | POA: Diagnosis not present

## 2020-11-23 DIAGNOSIS — L723 Sebaceous cyst: Secondary | ICD-10-CM | POA: Diagnosis not present

## 2020-11-23 DIAGNOSIS — L219 Seborrheic dermatitis, unspecified: Secondary | ICD-10-CM | POA: Diagnosis not present

## 2020-11-23 DIAGNOSIS — L814 Other melanin hyperpigmentation: Secondary | ICD-10-CM | POA: Diagnosis not present

## 2020-11-24 NOTE — Progress Notes (Deleted)
Follow Up Note  RE: Jeff Lopez MRN: [id number] DOB: 05/15/1951 Date of Office Visit: 11/25/2020  Referring provider: Caren Macadam, MD Primary care provider: Caren Macadam, MD  Chief Complaint: No chief complaint on file.  History of Present Illness: I had the pleasure of seeing Jeff Lopez for a follow up visit at [facility]  on 11/24/2020. He is a 70 y.o. male, who is being followed for rhinitis and pruritus. His previous allergy office visit was on 08/24/2020 with Dr. Maudie Lopez. Today is a regular follow up visit.  Mixed allergic and non-allergic rhinitis Perennial rhinoconjunctivitis symptoms for the last 5 years after he had sinus surgery.  Tried Zyrtec, Claritin, Allegra, Flonase, Nasacort, Benadryl with minimal benefit.  No prior allergy injections. Unsure of exact triggers but strong scents worsen symptoms.   Today's skin testing showed: Positive to dust mites, mold, dog, cockroaches.  He most likely has mixed allergic and non-allergic rhinitis.   Start environmental control measures as below.  May use over the counter antihistamines such as Zyrtec (cetirizine), Claritin (loratadine), Allegra (fexofenadine), or Xyzal (levocetirizine) daily as needed.  May use ipratropium nasal spray 1-2 sprays per nostril twice a day as needed for runny nose/drainage.  Take Nasonex 2 sprays per nostril at night for nasal congestion.   Nasal saline spray (i.e., Simply Saline) or nasal saline lavage (i.e., NeilMed) is recommended as needed and prior to medicated nasal sprays.  Read about allergy injections - handout given.   If no improvement will add on Singulair next and try azelastine nasal spray.   Pruritus Pruritis with associated rash at times on the arms. No specific triggers noted.  Today's skin testing showed: Positive to dust mites, mold, dog, cockroaches. Borderline to shellfish - eats a varied diet.   No need to avoid any foods at this  time.   Take a daily antihistamine as above to control symptoms.   Continue proper skin care.   Return in about 3 months (around 11/24/2020).  Assessment and Plan: Jeff Lopez is a 71 y.o. male with: No problem-specific Assessment & Plan notes found for this encounter.  No follow-ups on file.  No orders of the defined types were placed in this encounter.  Lab Orders  No laboratory test(s) ordered today    Diagnostics: Spirometry:  Tracings reviewed. His effort: {Blank single:19197::"Good reproducible efforts.","It was hard to get consistent efforts and there is a question as to whether this reflects a maximal maneuver.","Poor effort, data can not be interpreted."} FVC: ***L FEV1: ***L, ***% predicted FEV1/FVC ratio: ***% Interpretation: {Blank single:19197::"Spirometry consistent with mild obstructive disease","Spirometry consistent with moderate obstructive disease","Spirometry consistent with severe obstructive disease","Spirometry consistent with possible restrictive disease","Spirometry consistent with mixed obstructive and restrictive disease","Spirometry uninterpretable due to technique","Spirometry consistent with normal pattern","No overt abnormalities noted given today's efforts"}.  Please see scanned spirometry results for details.  Skin Testing: {Blank single:19197::"Select foods","Environmental allergy panel","Environmental allergy panel and select foods","Food allergy panel","None","Deferred due to recent antihistamines use"}. Positive test to: ***. Negative test to: ***.  Results discussed with patient/family.   Medication List:  Current Outpatient Medications  Medication Sig Dispense Refill  . amLODipine (NORVASC) 5 MG tablet Take 1 tablet (5 mg total) by mouth daily. 90 tablet 3  . cholecalciferol (VITAMIN D3) 25 MCG (1000 UT) tablet Take 1,000 Units by mouth daily.    . clobetasol (TEMOVATE) 0.05 % external solution APPLY TO SCALP 2 TIMES DAILY FOR 14 DAYS    .  Eszopiclone  3 MG TABS TAKE 1 TABLET BY MOUTH immediately BEFORE bedtime AS NEEDED 90 tablet 1  . ipratropium (ATROVENT) 0.03 % nasal spray Place 1-2 sprays into both nostrils 2 (two) times daily as needed (runny nose). 30 mL 5  . irbesartan (AVAPRO) 300 MG tablet Take 1 tablet (300 mg total) by mouth daily. 90 tablet 3  . levothyroxine (SYNTHROID) 137 MCG tablet TAKE 1 TABLET BY MOUTH EVERY DAY BEFORE BREAKFAST 30 tablet 5  . metFORMIN (GLUCOPHAGE-XR) 500 MG 24 hr tablet TAKE 1 TABLET BY MOUTH EVERY DAY WITH BREAKFAST 30 tablet 5  . metoprolol succinate (TOPROL-XL) 50 MG 24 hr tablet Take 1 tablet (50 mg total) by mouth daily. Take with or immediately following a meal. 90 tablet 3  . mometasone (NASONEX) 50 MCG/ACT nasal spray Place 2 sprays into the nose daily. For nasal congestion 1 each 5  . pantoprazole (PROTONIX) 40 MG tablet TAKE 1 TABLET BY MOUTH 2 TIMES DAILY **NEED APPOINTMENT FOR FURTHER REFILLS** 30 tablet 0   No current facility-administered medications for this visit.   Allergies: Allergies  Allergen Reactions  . Ceftin Other (See Comments)    Patient stated it caused "sores in mouth" Thrush  . Cefuroxime Axetil Other (See Comments)    Sores in mouth Sores in mouth Patient stated it caused "sores in mouth" Thrush other  . Cephalexin Other (See Comments)    Other   I reviewed his past medical history, social history, family history, and environmental history and no significant changes have been reported from his previous visit.  Review of Systems  Constitutional: Negative for appetite change, chills, fever and unexpected weight change.  HENT: Positive for congestion, rhinorrhea and sneezing.   Eyes: Positive for itching.  Respiratory: Negative for cough, chest tightness, shortness of breath and wheezing.   Cardiovascular: Negative for chest pain.  Gastrointestinal: Negative for abdominal pain.  Genitourinary: Negative for difficulty urinating.  Skin: Positive for rash.   Allergic/Immunologic: Positive for environmental allergies.  Neurological: Positive for headaches.   Objective: There were no vitals taken for this visit. There is no height or weight on file to calculate BMI. Physical Exam Vitals and nursing note reviewed.  Constitutional:      Appearance: Normal appearance. He is well-developed.  HENT:     Head: Normocephalic and atraumatic.     Right Ear: External ear normal.     Left Ear: External ear normal.     Nose: Nose normal.     Mouth/Throat:     Mouth: Mucous membranes are moist.     Pharynx: Oropharynx is clear.  Eyes:     Conjunctiva/sclera: Conjunctivae normal.  Cardiovascular:     Rate and Rhythm: Normal rate and regular rhythm.     Heart sounds: Normal heart sounds. No murmur heard. No friction rub. No gallop.   Pulmonary:     Effort: Pulmonary effort is normal.     Breath sounds: Normal breath sounds. No wheezing, rhonchi or rales.  Abdominal:     Palpations: Abdomen is soft.  Musculoskeletal:     Cervical back: Neck supple.  Skin:    General: Skin is warm.     Findings: No rash.  Neurological:     Mental Status: He is alert and oriented to person, place, and time.  Psychiatric:        Behavior: Behavior normal.    Previous notes and tests were reviewed. The plan was reviewed with the patient/family, and all questions/concerned were addressed.  It was  my pleasure to see Jeff Lopez today and participate in his care. Please feel free to contact me with any questions or concerns.  Sincerely,  Rexene Alberts, DO Allergy & Immunology  [facility] office: [place] office: [phone]

## 2020-11-25 ENCOUNTER — Ambulatory Visit: Payer: PPO | Admitting: Allergy

## 2020-11-25 DIAGNOSIS — L299 Pruritus, unspecified: Secondary | ICD-10-CM

## 2020-11-25 DIAGNOSIS — J3089 Other allergic rhinitis: Secondary | ICD-10-CM

## 2020-12-09 ENCOUNTER — Telehealth: Payer: Self-pay | Admitting: Pharmacist

## 2020-12-09 NOTE — Chronic Care Management (AMB) (Signed)
Chronic Care Management Pharmacy Assistant   Name: Jeff Lopez  MRN: [id number] DOB: 02/04/1951  Reason for Encounter: Disease State   Conditions to be addressed/monitored: DMII  Recent office visits:  02.09.2022 Caren Macadam, MD (PCP) patient seen for 36-month follow-up. Labs ordered.  Recent consult visits:  03.01.2022 Garnet Sierras, DO Allergy initial evaluation for rhinitis. Medication prescribed: Ipratropium Bromide 0.03 % 1-2 sprays Each Nare 2 times daily PRN and Mometasone Furoate 50 MCG/ACT 2 sprays Nasal Daily, For nasal congestion. [id number] Leanor Kail, [facility] patient seen for annual follow up exam. sing for coronary artery disease and hypertension.  Hospital visits:  None in previous 6 months  Medications: Outpatient Encounter Medications as of 12/09/2020  Medication Sig   amLODipine (NORVASC) 5 MG tablet Take 1 tablet (5 mg total) by mouth daily.   cholecalciferol (VITAMIN D3) 25 MCG (1000 UT) tablet Take 1,000 Units by mouth daily.   clobetasol (TEMOVATE) 0.05 % external solution APPLY TO SCALP 2 TIMES DAILY FOR 14 DAYS   Eszopiclone 3 MG TABS TAKE 1 TABLET BY MOUTH immediately BEFORE bedtime AS NEEDED   ipratropium (ATROVENT) 0.03 % nasal spray Place 1-2 sprays into both nostrils 2 (two) times daily as needed (runny nose).   irbesartan (AVAPRO) 300 MG tablet Take 1 tablet (300 mg total) by mouth daily.   levothyroxine (SYNTHROID) 137 MCG tablet TAKE 1 TABLET BY MOUTH EVERY DAY BEFORE BREAKFAST   metFORMIN (GLUCOPHAGE-XR) 500 MG 24 hr tablet TAKE 1 TABLET BY MOUTH EVERY DAY WITH BREAKFAST   metoprolol succinate (TOPROL-XL) 50 MG 24 hr tablet Take 1 tablet (50 mg total) by mouth daily. Take with or immediately following a meal.   mometasone (NASONEX) 50 MCG/ACT nasal spray Place 2 sprays into the nose daily. For nasal congestion   pantoprazole (PROTONIX) 40 MG tablet TAKE 1 TABLET BY MOUTH 2 TIMES DAILY **NEED APPOINTMENT FOR FURTHER  REFILLS**   No facility-administered encounter medications on file as of 12/09/2020.  Recent Relevant Labs: Lab Results  Component Value Date/Time   HGBA1C 7.5 (H) 07/20/2020 08:47 AM   HGBA1C 7.3 (H) 04/23/2020 09:14 AM   MICROALBUR 3.6 (H) 02/12/2019 10:06 AM   MICROALBUR 13.1 (H) 07/31/2018 08:44 AM    Kidney Function Lab Results  Component Value Date/Time   CREATININE 1.07 07/20/2020 08:47 AM   CREATININE 1.17 04/23/2020 09:14 AM   CREATININE 1.11 01/19/2020 09:53 AM   GFR 71.41 07/20/2020 08:47 AM   GFRNONAA 58 (L) 07/05/2017 10:24 AM   GFRAA 67 07/05/2017 10:24 AM   Current antihyperglycemic regimen:  Metformin 500 mg:One tablet mouth daily with breakfast What recent interventions/DTPs have been made to improve glycemic control:  None Have there been any recent hospitalizations or ED visits since last visit with CPP? No Patient denies hypoglycemic symptoms, including Pale, Sweaty, Shaky, Hungry, Nervous/irritable, and Vision changes Patient denies hyperglycemic symptoms, including blurry vision, excessive thirst, fatigue, polyuria, and weakness How often are you checking your blood sugar? Patient is not taking blood sugar. What are your blood sugars ranging?  Fasting: N/A Before meals: N/A After meals: N/A Bedtime: N/A During the week, how often does your blood glucose drop below 70? Never Are you checking your feet daily/regularly?  Yes  Adherence Review: Is the patient currently on a STATIN medication? No Is the patient currently on ACE/ARB medication? Yes Does the patient have >5 day gap between last estimated fill dates? No  I spoke with the patient about medication adherence. He  stated that he has been doing well. He has not been monitoring his blood sugar. He states that he does not keep track on it on a consistently. He stated that he is taking his medication as prescribed. He states that the only this he takes for his diabetes is Metformin. He stated that he  walks and has change his diet and has not seen a change in his A1C. He stated that he does not eat fast food, cut down on the soda that he drinks and has very little carb intake. He is does not have any side effect from his current medications. They have been no recent changes in his medication. There has been no hospital visit or urgent care visit since his las CPP or PCP visit. He does not have any issues with his pharmacy. He has a follow-up appointment with PCP in August. He declined making a CCM appointment at this time. He would like to see his primacy doctor before making an appointment.  Star Rating Drugs: Medication Dispensed  Quantity Pharmacy  Irbesartan 300 mg 05.16.2022 [facility]  Metformin 500 mg  05.16.2022 [facility] ([place] Mare Ferrari, [place] Pharmacist Assistant [phone]

## 2020-12-15 ENCOUNTER — Other Ambulatory Visit: Payer: Self-pay | Admitting: Gastroenterology

## 2021-01-26 ENCOUNTER — Other Ambulatory Visit: Payer: Self-pay

## 2021-01-26 ENCOUNTER — Other Ambulatory Visit (INDEPENDENT_AMBULATORY_CARE_PROVIDER_SITE_OTHER): Payer: PPO

## 2021-01-26 DIAGNOSIS — E785 Hyperlipidemia, unspecified: Secondary | ICD-10-CM | POA: Diagnosis not present

## 2021-01-26 DIAGNOSIS — E1169 Type 2 diabetes mellitus with other specified complication: Secondary | ICD-10-CM | POA: Diagnosis not present

## 2021-01-26 DIAGNOSIS — E1143 Type 2 diabetes mellitus with diabetic autonomic (poly)neuropathy: Secondary | ICD-10-CM | POA: Diagnosis not present

## 2021-01-26 DIAGNOSIS — E890 Postprocedural hypothyroidism: Secondary | ICD-10-CM | POA: Diagnosis not present

## 2021-01-26 DIAGNOSIS — I10 Essential (primary) hypertension: Secondary | ICD-10-CM | POA: Diagnosis not present

## 2021-01-26 DIAGNOSIS — R7989 Other specified abnormal findings of blood chemistry: Secondary | ICD-10-CM

## 2021-01-26 LAB — CBC WITH DIFFERENTIAL/PLATELET
Basophils Absolute: 0 10*3/uL (ref 0.0–0.1)
Basophils Relative: 0.9 % (ref 0.0–3.0)
Eosinophils Absolute: 0.2 10*3/uL (ref 0.0–0.7)
Eosinophils Relative: 5 % (ref 0.0–5.0)
HCT: 44.7 % (ref 39.0–52.0)
Hemoglobin: 15.1 g/dL (ref 13.0–17.0)
Lymphocytes Relative: 23.9 % (ref 12.0–46.0)
Lymphs Abs: 1.2 10*3/uL (ref 0.7–4.0)
MCHC: 33.9 g/dL (ref 30.0–36.0)
MCV: 96.7 fl (ref 78.0–100.0)
Monocytes Absolute: 0.5 10*3/uL (ref 0.1–1.0)
Monocytes Relative: 9.3 % (ref 3.0–12.0)
Neutro Abs: 3 10*3/uL (ref 1.4–7.7)
Neutrophils Relative %: 60.9 % (ref 43.0–77.0)
Platelets: 162 10*3/uL (ref 150.0–400.0)
RBC: 4.62 Mil/uL (ref 4.22–5.81)
RDW: 12.4 % (ref 11.5–15.5)
WBC: 4.9 10*3/uL (ref 4.0–10.5)

## 2021-01-26 LAB — LIPID PANEL
Cholesterol: 167 mg/dL (ref 0–200)
HDL: 39.8 mg/dL (ref >39.00)
NonHDL: 127.17
Total CHOL/HDL Ratio: 4
Triglycerides: 226 mg/dL — ABNORMAL HIGH (ref 0.0–149.0)
VLDL: 45.2 mg/dL — ABNORMAL HIGH (ref 0.0–40.0)

## 2021-01-26 LAB — COMPREHENSIVE METABOLIC PANEL
ALT: 31 U/L (ref 0–53)
AST: 17 U/L (ref 0–37)
Albumin: 4.3 g/dL (ref 3.5–5.2)
Alkaline Phosphatase: 85 U/L (ref 39–117)
BUN: 21 mg/dL (ref 6–23)
CO2: 26 mEq/L (ref 19–32)
Calcium: 9.3 mg/dL (ref 8.4–10.5)
Chloride: 103 mEq/L (ref 96–112)
Creatinine, Ser: 1.2 mg/dL (ref 0.40–1.50)
GFR: 62 mL/min (ref >60.00)
Glucose, Bld: 237 mg/dL — ABNORMAL HIGH (ref 70–99)
Potassium: 4.1 mEq/L (ref 3.5–5.1)
Sodium: 140 mEq/L (ref 135–145)
Total Bilirubin: 1 mg/dL (ref 0.2–1.2)
Total Protein: 6.8 g/dL (ref 6.0–8.3)

## 2021-01-26 LAB — MICROALBUMIN / CREATININE URINE RATIO
Creatinine,U: 116.9 mg/dL
Microalb Creat Ratio: 9.7 mg/g (ref 0.0–30.0)
Microalb, Ur: 11.4 mg/dL — ABNORMAL HIGH (ref 0.0–1.9)

## 2021-01-26 LAB — TSH: TSH: 5.34 u[IU]/mL (ref 0.35–5.50)

## 2021-01-26 LAB — VITAMIN D 25 HYDROXY (VIT D DEFICIENCY, FRACTURES): VITD: 31.16 ng/mL (ref 30.00–100.00)

## 2021-01-26 LAB — HEMOGLOBIN A1C: Hgb A1c MFr Bld: 8.8 % — ABNORMAL HIGH (ref 4.6–6.5)

## 2021-01-26 LAB — LDL CHOLESTEROL, DIRECT: Direct LDL: 99 mg/dL

## 2021-01-27 LAB — IRON,TIBC AND FERRITIN PANEL
%SAT: 62 % (calc) — ABNORMAL HIGH (ref 20–48)
Ferritin: 295 ng/mL (ref 24–380)
Iron: 199 ug/dL — ABNORMAL HIGH (ref 50–180)
TIBC: 323 mcg/dL (calc) (ref 250–425)

## 2021-02-01 ENCOUNTER — Other Ambulatory Visit: Payer: Self-pay

## 2021-02-02 ENCOUNTER — Ambulatory Visit (INDEPENDENT_AMBULATORY_CARE_PROVIDER_SITE_OTHER): Payer: PPO | Admitting: Family Medicine

## 2021-02-02 ENCOUNTER — Encounter: Payer: Self-pay | Admitting: Family Medicine

## 2021-02-02 VITALS — BP 120/90 | HR 63 | Temp 97.7°F | Ht 68.25 in | Wt 198.3 lb

## 2021-02-02 DIAGNOSIS — E785 Hyperlipidemia, unspecified: Secondary | ICD-10-CM

## 2021-02-02 DIAGNOSIS — E1143 Type 2 diabetes mellitus with diabetic autonomic (poly)neuropathy: Secondary | ICD-10-CM | POA: Diagnosis not present

## 2021-02-02 DIAGNOSIS — E890 Postprocedural hypothyroidism: Secondary | ICD-10-CM | POA: Diagnosis not present

## 2021-02-02 DIAGNOSIS — E1169 Type 2 diabetes mellitus with other specified complication: Secondary | ICD-10-CM | POA: Diagnosis not present

## 2021-02-02 DIAGNOSIS — G4733 Obstructive sleep apnea (adult) (pediatric): Secondary | ICD-10-CM

## 2021-02-02 DIAGNOSIS — R079 Chest pain, unspecified: Secondary | ICD-10-CM | POA: Diagnosis not present

## 2021-02-02 DIAGNOSIS — I10 Essential (primary) hypertension: Secondary | ICD-10-CM | POA: Diagnosis not present

## 2021-02-02 MED ORDER — BLOOD GLUCOSE MONITOR KIT: PACK | 0 refills | Status: DC

## 2021-02-02 MED ORDER — METFORMIN HCL ER 500 MG PO TB24: 500.0000 mg | ORAL_TABLET | Freq: Every day | ORAL | 1 refills | Status: DC

## 2021-02-02 MED ORDER — FLUTICASONE PROPIONATE 50 MCG/ACT NA SUSP: 2.0000 | Freq: Every day | NASAL | 5 refills | Status: DC

## 2021-02-02 NOTE — Patient Instructions (Signed)
*  start rybelsus - take on empty stomach in the morning at least 30 minutes prior to eating. Do not take with other medications.   *

## 2021-02-02 NOTE — Progress Notes (Signed)
Jeff Lopez DOB: 1950/11/01 Encounter date: 02/02/2021  This is a 70 y.o. male here for follow up - not feeling well.   History of present illness/Additional concerns: Feels terrible. Gained weight. Knows A1C up; just quit trying to eat healthy. Doesn't feel like doing anything. Just wants to sleep all the time. Sour frame of mind. Just got so frustrated with all the effort he was putting in and not seeing the results come through for his A1C. Starting feeling bad before he stopped with his exercise and more controlled eating.   Not doing anything besides eating, watching TV. Not thinking about hurting self; just frustrated. Dad passed in November. Friends not wanting to do anything. He stopped walking. States that it is too hot out there now for him to be out walking. Still eating chicken, brussel sprouts. He is drinking cokes and eating french fries. Drinking glass of soda about every other day.  Sinuses are "messed up bad". Has tried sprays, pills, without benefit. Mouth breathing at night- wakes up with dry mouth. Wakes him up from sleep because he can't breathe through nose. Using nasal decongestant spray every 3 hours. Doesn't use any prescription.   Didn't tolerate the CPAP in the past; was referred for oral appliance but states that he didn't go. Referred to Dr. Corky Sing office for this.   Shot winded: getting out of car to walk up to store feels tired by time he gets there.   Had sharp left sided chest pain last week. Worried he was having heart attack, but it went away. Didn't do anything to cause or to make it go away. States last cardio visit this year was good.   Using lunesta for sleep - really helps. Doesn't feel like he can sleep without this medication.   Past Medical History:  Diagnosis Date   Allergy    per pt   CAD, NATIVE VESSEL 04/19/2010   nonobstructive by cath 2007:  oLAD 20-30%, mLAD 50%, pCFX 20-30%, oAVCFX 20-30%, L renal art 50%;  normal LVF   Cataract     bil cataracts removed   COPD (chronic obstructive pulmonary disease) ([place])    Diabetes mellitus without complication ([place])    GERD 04/09/2007   Headache(784.0)    HYPERLIPIDEMIA 04/09/2007   Patient denies.   HYPERTENSION, BENIGN 04/19/2010   Hypothyroidism    Hypothyroidism    previous hyperthyroidism, s/p I-131   LUMBAR DISC DISORDER 05/27/2010   Nerve damage    Left leg   OSTEOARTHRITIS, LUMBAR SPINE 04/09/2007   RENAL CYST 05/27/2010   Spinal headache    After having back surgery in 2014   Past Surgical History:  Procedure Laterality Date   BACK SURGERY  [id number]   CARDIAC CATHETERIZATION  2007   Dr Loanne Drilling   CHOLECYSTECTOMY     CHOLECYSTECTOMY N/A 07/13/2013   Procedure: LAPAROSCOPIC CHOLECYSTECTOMY WITH INTRAOPERATIVE CHOLANGIOGRAM;  Surgeon: Shann Medal, MD;  Location: [facility];  Service: General;  Laterality: N/A;   COLONOSCOPY  2018   COLONOSCOPY W/ POLYPECTOMY     ERCP N/A 07/14/2013   Procedure: ENDOSCOPIC RETROGRADE CHOLANGIOPANCREATOGRAPHY (ERCP);  Surgeon: [name], MD;  Location: [facility];  Service: Endoscopy;  Laterality: N/A;   EYE SURGERY Bilateral    Cataract removal   FOOT FRACTURE SURGERY     FRACTURE SURGERY     LUMBAR [place] SURGERY  08/06/2012   L3 & L4   LUMBAR DISC SURGERY  11/11/2015   L1 L2    DR  NITKA   LUMBAR FUSION N/A 04/20/2015   Procedure: T12 to L1 fusion (Extension of Previous Fusion L2-S1 to T12-S1), Right Transforaminal lumbar interbody fusion, Posterior Fusion T12 to L1, with Pedicle screws, allograft, local bone graft, and  Vivigen;  Surgeon: [name], MD;  Location: [facility];  Service: Orthopedics;  Laterality: N/A;   LUMBAR LAMINECTOMY N/A 11/10/2013   Procedure: Left L1-2 far lateral approach to excise herniated nucleus pulposus;  Surgeon: [name], MD;  Location: [facility];  Service: Orthopedics;  Laterality: N/A;   LUMBAR LAMINECTOMY/DECOMPRESSION MICRODISCECTOMY Left 08/10/2012   Procedure: Dura Repair Left Side L2-L3;   Surgeon: [name], MD;  Location: [facility];  Service: Orthopedics;  Laterality: Left;  Wilson Frame, Sliding table, dura repair kit, microscope   NECK SURGERY     X 2   SINOSCOPY     SPINE SURGERY  2006   C-spine surgery x 2   UPPER GASTROINTESTINAL ENDOSCOPY     Allergies  Allergen Reactions   Ceftin Other (See Comments)    Patient stated it caused "sores in mouth" Thrush   Cefuroxime Axetil Other (See Comments)    Sores in mouth Sores in mouth Patient stated it caused "sores in mouth" Thrush other   Cephalexin Other (See Comments)    Other   Current Meds  Medication Sig   amLODipine (NORVASC) 5 MG tablet Take 1 tablet (5 mg total) by mouth daily.   blood glucose meter kit and supplies KIT Dispense based on patient and insurance preference. Use up to four times daily as directed.   cholecalciferol (VITAMIN D3) 25 MCG (1000 UT) tablet Take 1,000 Units by mouth daily.   clobetasol (TEMOVATE) 0.05 % external solution APPLY TO SCALP 2 TIMES DAILY FOR 14 DAYS   Eszopiclone 3 MG TABS TAKE 1 TABLET BY MOUTH immediately BEFORE bedtime AS NEEDED   fluticasone (FLONASE) 50 MCG/ACT nasal spray Place 2 sprays into both nostrils daily.   irbesartan (AVAPRO) 300 MG tablet Take 1 tablet (300 mg total) by mouth daily.   levothyroxine (SYNTHROID) 125 MCG tablet Take 125 mcg by mouth daily before breakfast.   metoprolol succinate (TOPROL-XL) 50 MG 24 hr tablet Take 1 tablet (50 mg total) by mouth daily. Take with or immediately following a meal.   pantoprazole (PROTONIX) 40 MG tablet TAKE 1 TABLET BY MOUTH 2 TIMES DAILY. need appointment FOR refills   [DISCONTINUED] ipratropium (ATROVENT) 0.03 % nasal spray Place 1-2 sprays into both nostrils 2 (two) times daily as needed (runny nose).   [DISCONTINUED] metFORMIN (GLUCOPHAGE-XR) 500 MG 24 hr tablet TAKE 1 TABLET BY MOUTH EVERY DAY WITH BREAKFAST   Social History   Tobacco Use   Smoking status: Former    Packs/day: 1.00    Years: 37.00     Pack years: 37.00    Types: Cigarettes    Quit date: 04/06/2008    Years since quitting: 12.8   Smokeless tobacco: Never   Tobacco comments:    pt has stopped smoking about 9 months now  Substance Use Topics   Alcohol use: No   Family History  Problem Relation Age of Onset   Breast cancer Mother    Uterine cancer Mother    Heart disease Mother    Heart attack Mother 34   Prostate cancer Father    Hypertension Brother    Non-Hodgkin's lymphoma Brother    Stomach cancer Paternal Grandmother    Thyroid disease Neg Hx    Colon cancer  Neg Hx    Esophageal cancer Neg Hx    Rectal cancer Neg Hx    Pancreatic cancer Neg Hx    Colon polyps Neg Hx      Review of Systems  Constitutional:  Positive for fatigue. Negative for chills and fever.  HENT:  Positive for congestion (all the time; driving him crazy), rhinorrhea and sinus pressure. Negative for ear discharge, ear pain and sinus pain.   Respiratory:  Positive for shortness of breath. Negative for cough, chest tightness and wheezing.   Cardiovascular:  Positive for chest pain. Negative for palpitations and leg swelling.  Musculoskeletal:  Positive for back pain (chronic, stable).  Psychiatric/Behavioral:  Positive for sleep disturbance.    CBC:  Lab Results  Component Value Date   WBC 4.9 01/26/2021   HGB 15.1 01/26/2021   HGB 13.8 04/30/2014   HCT 44.7 01/26/2021   HCT 42.0 04/30/2014   MCH 32.9 01/19/2020   MCHC 33.9 01/26/2021   RDW 12.4 01/26/2021   RDW 13.1 04/30/2014   PLT 162.0 01/26/2021   PLT 215 04/30/2014   MPV 11.4 01/19/2020   CMP: Lab Results  Component Value Date   NA 140 01/26/2021   NA 140 07/05/2017   K 4.1 01/26/2021   CL 103 01/26/2021   CO2 26 01/26/2021   ANIONGAP 12 04/21/2015   GLUCOSE 237 (H) 01/26/2021   BUN 21 01/26/2021   BUN 19 07/05/2017   CREATININE 1.20 01/26/2021   CREATININE 1.17 04/23/2020   GFRAA 67 07/05/2017   CALCIUM 9.3 01/26/2021   PROT 6.8 01/26/2021   BILITOT 1.0  01/26/2021   ALKPHOS 85 01/26/2021   ALT 31 01/26/2021   AST 17 01/26/2021   LIPID: Lab Results  Component Value Date   CHOL 167 01/26/2021   TRIG 226.0 (H) 01/26/2021   HDL 39.80 01/26/2021   LDLCALC 74 07/20/2020   LDLCALC 75 01/19/2020    Objective:  BP 120/90 (BP Location: Left Arm, Patient Position: Sitting, Cuff Size: Normal)   Pulse 63   Temp 97.7 F (36.5 C) (Oral)   Ht 5' 8.25" (1.734 m)   Wt 198 lb 4.8 oz (89.9 kg)   SpO2 95%   BMI 29.93 kg/m   Weight: 198 lb 4.8 oz (89.9 kg)   BP Readings from Last 3 Encounters:  02/02/21 120/90  08/24/20 120/82  08/04/20 100/60   Wt Readings from Last 3 Encounters:  02/02/21 198 lb 4.8 oz (89.9 kg)  08/24/20 192 lb 6.4 oz (87.3 kg)  08/04/20 190 lb 6.4 oz (86.4 kg)    Physical Exam Constitutional:      General: He is not in acute distress.    Appearance: He is well-developed.  HENT:     Head: Normocephalic and atraumatic.  Cardiovascular:     Rate and Rhythm: Normal rate and regular rhythm.     Heart sounds: Normal heart sounds. No murmur heard. Pulmonary:     Effort: Pulmonary effort is normal.     Breath sounds: Normal breath sounds.  Abdominal:     General: Bowel sounds are normal. There is no distension.     Palpations: Abdomen is soft.     Tenderness: There is no abdominal tenderness. There is no guarding.  Skin:    General: Skin is warm and dry.     Comments: Sensory exam of the foot is normal, tested with the monofilament. Good pulses, no lesions or ulcers, good peripheral pulses.  Psychiatric:  Judgment: Judgment normal.    Assessment/Plan: Health Maintenance Due  Topic Date Due   INFLUENZA VACCINE  01/24/2021   FOOT EXAM  01/27/2021  . We converted visit today to address acute concerns rather than performing a physical. He is discouraged with trying his hardest and not getting "normal" blood results (wanted A1C 6.5 or less with all his hard work). His previous frustration with sugars,  medical conditions have been fueled by friends telling him he needs to just "live his life" and seeing other friends who are very healthy and regularly exercise just "drop dead". Additionally he has long doubted that he has diabetes because others in his family don't, and was told by previous doctors that he should be able to treat this with diet and exercise alone.   1. HYPERTENSION, BENIGN More elevated than desired. Encouraged him to get home cuff and monitor at home. He has not checked at home in the past.   2. OSA (obstructive sleep apnea) He states he will not wear any sort of mask again. He is open to dental appliance although previously didn't follow through with referral when placed by pulm. I will re-order this.  - Ambulatory referral to Dentistry  3. Hyperlipidemia associated with type 2 diabetes mellitus ([place]) Should be on statin; discussed elevated LDL today - this has elevated since he quit eating as healthy. He is overwhelmed with medications and conditions; we are going to work on sugar control and he states he will get back on track with better eating and regular exercise which should help with cholesterol.  4. Type II diabetes mellitus with peripheral autonomic neuropathy ([facility]) Increased A1C. He has been compliant with metformin. He has not been compliant with diet. Offered dietician referral, which I think would be helpful for him to go into more details and address areas that can be improved with diet. He correlates diabetic diet with depriving self of enjoyment from foods and I do feel that spending time to go through this in more detail with professional would be helpful. Rybelsus sample given to him today (11m). Discussed new medication(s) today with patient. Discussed potential side effects and patient verbalized understanding. Glucometer sent in so that he has tool to check sugar at home. Would benefit from reviewing blood sugar control ideas in more detail. I am going to cc  pharmacy as I think that would be helpful for patient.   5. Hypothyroidism following radioiodine therapy Continue with synthroid. He is not taking medication correctly (takes with all other meds at same time). May be able to work on dose decrease if taking properly. Will continue to work on this.   6. Chest pain, unspecified type Ekg normal sinus rhythm. No acute changes. Follow up with cardiology. - EKG 12-Lead    Return in about 1 month (around 03/05/2021) for Chronic condition visit.  JMicheline Rough MD

## 2021-02-09 ENCOUNTER — Telehealth: Payer: Self-pay | Admitting: Pharmacist

## 2021-02-09 NOTE — Progress Notes (Signed)
    Chronic Care Management Pharmacy Assistant   Name: Jeff Lopez  MRN: [id number] DOB: [birth year]  Reason for Encounter: Disease State Diabetes Assessment Call   Conditions to be addressed/monitored: DMII  Recent office visits:  02-02-2021 Caren Macadam, MD - Patient presented for HYPERTENSION, BENIGN and other concerns. Prescribed Fluticasone 50 MCG/ACT Changed Levothyroxine to 125 MCG Stopped Ipratropium 0.03 % & Mometasone 50 MCG/ACT   Recent consult visits:  None  Hospital visits:  None in previous 6 months  Medications: Outpatient Encounter Medications as of 02/09/2021  Medication Sig   amLODipine (NORVASC) 5 MG tablet Take 1 tablet (5 mg total) by mouth daily.   blood glucose meter kit and supplies KIT Dispense based on patient and insurance preference. Use up to four times daily as directed.   cholecalciferol (VITAMIN D3) 25 MCG (1000 UT) tablet Take 1,000 Units by mouth daily.   clobetasol (TEMOVATE) 0.05 % external solution APPLY TO SCALP 2 TIMES DAILY FOR 14 DAYS   Eszopiclone 3 MG TABS TAKE 1 TABLET BY MOUTH immediately BEFORE bedtime AS NEEDED   fluticasone (FLONASE) 50 MCG/ACT nasal spray Place 2 sprays into both nostrils daily.   irbesartan (AVAPRO) 300 MG tablet Take 1 tablet (300 mg total) by mouth daily.   levothyroxine (SYNTHROID) 125 MCG tablet Take 125 mcg by mouth daily before breakfast.   metFORMIN (GLUCOPHAGE-XR) 500 MG 24 hr tablet Take 1 tablet (500 mg total) by mouth daily with breakfast. TAKE 1 TABLET BY MOUTH EVERY DAY WITH BREAKFAST   metoprolol succinate (TOPROL-XL) 50 MG 24 hr tablet Take 1 tablet (50 mg total) by mouth daily. Take with or immediately following a meal.   pantoprazole (PROTONIX) 40 MG tablet TAKE 1 TABLET BY MOUTH 2 TIMES DAILY. need appointment FOR refills   No facility-administered encounter medications on file as of 02/09/2021.   Recent Relevant Labs: Lab Results  Component Value Date/Time   HGBA1C 8.8 (H) 01/26/2021  08:48 AM   HGBA1C 7.5 (H) 07/20/2020 08:47 AM   MICROALBUR 11.4 (H) 01/26/2021 08:48 AM   MICROALBUR 3.6 (H) 02/12/2019 10:06 AM    Kidney Function Lab Results  Component Value Date/Time   CREATININE 1.20 01/26/2021 08:48 AM   CREATININE 1.07 07/20/2020 08:47 AM   CREATININE 1.17 04/23/2020 09:14 AM   CREATININE 1.11 01/19/2020 09:53 AM   GFR 62.00 01/26/2021 08:48 AM   GFRNONAA 58 (L) 07/05/2017 10:24 AM   GFRAA 67 07/05/2017 10:24 AM    Current antihyperglycemic regimen:  metformin ER 540m, 1 tablet once daily with breakfast    Adherence Review: Is the patient currently on a STATIN medication? No Is the patient currently on ACE/ARB medication? Yes Does the patient have >5 day gap between last estimated fill dates? No   Care Gaps: Foot Exam - Overdue Flu Vaccine - Overdue CCM F/U Call - (sept needs scheduling) AWV - 05-28-20 - MSG sent to RRamond CraverCMA to schedule.   Star Rating Drugs: Irbesartan (Avapro) 300 mg - [facility]: Attempted to reach on -8-17 no machine Attempted to reach on 8-22 no machine and no other contact reached out to CPP Attempted to reach on 8-24 no machine and no other contact reached out to CPP again  LHarrisonPharmacist Assistant [phone]

## 2021-02-17 ENCOUNTER — Ambulatory Visit: Payer: PPO | Admitting: Podiatry

## 2021-02-21 ENCOUNTER — Telehealth: Payer: Self-pay | Admitting: *Deleted

## 2021-02-21 ENCOUNTER — Encounter: Payer: Self-pay | Admitting: *Deleted

## 2021-02-21 NOTE — Telephone Encounter (Signed)
Letter mailed to the patients address to contact the pharmacist as below.

## 2021-02-21 NOTE — Telephone Encounter (Signed)
-----   Message from Caren Macadam, MD sent at 02/18/2021 12:20 PM EDT ----- Regarding: RE: Trouble reaching patient Please send letter stating we are unable to reach by phone and please update [place] if he has a new number.    ----- Message ----- From: Viona Gilmore, [facility] Sent: 02/16/2021  12:47 PM EDT To: Caren Macadam, MD Subject: Trouble reaching patient                       Hey,  Can you make a note somewhere to make sure to update his contact info when he sees you in Sept? I tried calling the patient but looks like his phone number is not working/disconnected. You sent me a message to reach out to see if I could see him but we haven't been able to get a hold of him at all.  Thanks! Maddie

## 2021-03-07 ENCOUNTER — Ambulatory Visit: Payer: PPO | Admitting: Pulmonary Disease

## 2021-03-09 ENCOUNTER — Ambulatory Visit (INDEPENDENT_AMBULATORY_CARE_PROVIDER_SITE_OTHER): Payer: PPO | Admitting: Family Medicine

## 2021-03-09 ENCOUNTER — Encounter: Payer: Self-pay | Admitting: Family Medicine

## 2021-03-09 ENCOUNTER — Other Ambulatory Visit: Payer: Self-pay

## 2021-03-09 VITALS — BP 118/80 | HR 67 | Temp 97.9°F | Ht 68.25 in | Wt 193.6 lb

## 2021-03-09 DIAGNOSIS — Z23 Encounter for immunization: Secondary | ICD-10-CM | POA: Diagnosis not present

## 2021-03-09 DIAGNOSIS — E785 Hyperlipidemia, unspecified: Secondary | ICD-10-CM | POA: Diagnosis not present

## 2021-03-09 DIAGNOSIS — E1143 Type 2 diabetes mellitus with diabetic autonomic (poly)neuropathy: Secondary | ICD-10-CM

## 2021-03-09 DIAGNOSIS — E1169 Type 2 diabetes mellitus with other specified complication: Secondary | ICD-10-CM | POA: Diagnosis not present

## 2021-03-09 DIAGNOSIS — I10 Essential (primary) hypertension: Secondary | ICD-10-CM

## 2021-03-09 MED ORDER — RYBELSUS 7 MG PO TABS: 7.0000 mg | ORAL_TABLET | Freq: Every day | ORAL | 5 refills | Status: DC

## 2021-03-09 NOTE — Progress Notes (Signed)
Jeff Lopez DOB: 1951/05/06 Encounter date: 03/09/2021  This is a 70 y.o. male who presents with Chief Complaint  Patient presents with   Follow-up     History of present illness: Last visit was 1 month ago.  Encouraged at that visit to check blood pressure at home.  He was referred to dentistry to discuss oral appliance for sleep apnea since he will not wear a CPAP.  He was given Rybelsus samples at last visit (3 mg) glucometer was also sent in for him to be able to check sugars at home. States that it tastes terrible. Did have some headaches as well since last visit. He hasn't checked home blood sugars.   He has cut out soda, limited unhealthy food; he has new  walking shoes today and is working on getting back on track.   Nasal congestion is better with the flonase - he stopped using his otc daily decongestant.   Has been taking protonix for years, but lately in the last month has had some heartburn and figured out that [employer] breast that is causing it.   Allergies  Allergen Reactions   Ceftin Other (See Comments)    Patient stated it caused "sores in mouth" Thrush   Cefuroxime Axetil Other (See Comments)    Sores in mouth Sores in mouth Patient stated it caused "sores in mouth" Thrush other   Cephalexin Other (See Comments)    Other   Current Meds  Medication Sig   amLODipine (NORVASC) 5 MG tablet Take 1 tablet (5 mg total) by mouth daily.   blood glucose meter kit and supplies KIT Dispense based on patient and insurance preference. Use up to four times daily as directed.   cholecalciferol (VITAMIN D3) 25 MCG (1000 UT) tablet Take 1,000 Units by mouth daily.   clobetasol (TEMOVATE) 0.05 % external solution APPLY TO SCALP 2 TIMES DAILY FOR 14 DAYS   Eszopiclone 3 MG TABS TAKE 1 TABLET BY MOUTH immediately BEFORE bedtime AS NEEDED   fluticasone (FLONASE) 50 MCG/ACT nasal spray Place 2 sprays into both nostrils daily.   irbesartan (AVAPRO) 300 MG  tablet Take 1 tablet (300 mg total) by mouth daily.   levothyroxine (SYNTHROID) 125 MCG tablet Take 125 mcg by mouth daily before breakfast.   metFORMIN (GLUCOPHAGE-XR) 500 MG 24 hr tablet Take 1 tablet (500 mg total) by mouth daily with breakfast. TAKE 1 TABLET BY MOUTH EVERY DAY WITH BREAKFAST   metoprolol succinate (TOPROL-XL) 50 MG 24 hr tablet Take 1 tablet (50 mg total) by mouth daily. Take with or immediately following a meal.   pantoprazole (PROTONIX) 40 MG tablet TAKE 1 TABLET BY MOUTH 2 TIMES DAILY. need appointment FOR refills   Semaglutide (RYBELSUS) 7 MG TABS Take 7 mg by mouth daily.    Review of Systems  Constitutional:  Negative for chills, fatigue and fever.  Respiratory:  Negative for cough, chest tightness, shortness of breath and wheezing.   Cardiovascular:  Negative for chest pain, palpitations and leg swelling.   Objective:  BP 118/80 (BP Location: Left Arm, Patient Position: Sitting, Cuff Size: Large)   Pulse 67   Temp 97.9 F (36.6 C) (Oral)   Ht 5' 8.25" (1.734 m)   Wt 193 lb 9.6 oz (87.8 kg)   BMI 29.22 kg/m   Weight: 193 lb 9.6 oz (87.8 kg)   BP Readings from Last 3 Encounters:  03/09/21 118/80  02/02/21 120/90  08/24/20 120/82   Wt Readings from Last 3  Encounters:  03/09/21 193 lb 9.6 oz (87.8 kg)  02/02/21 198 lb 4.8 oz (89.9 kg)  08/24/20 192 lb 6.4 oz (87.3 kg)    Physical Exam Constitutional:      General: He is not in acute distress.    Appearance: He is well-developed.  Cardiovascular:     Rate and Rhythm: Normal rate and regular rhythm.     Heart sounds: Normal heart sounds. No murmur heard.   No friction rub.  Pulmonary:     Effort: Pulmonary effort is normal. No respiratory distress.     Breath sounds: Normal breath sounds. No wheezing or rales.  Musculoskeletal:     Right lower leg: No edema.     Left lower leg: No edema.  Neurological:     Mental Status: He is alert and oriented to person, place, and time.  Psychiatric:         Behavior: Behavior normal.    Assessment/Plan  1. HYPERTENSION, BENIGN Well controlled; improved with recent weight loss.   2. Hyperlipidemia associated with type 2 diabetes mellitus ([place]) Not interested in statin right now; wants to limit medications.   3. Type II diabetes mellitus with peripheral autonomic neuropathy ([place]) He is back on track with working on healthy changes including diet and exercise and has started rybelsus.  - Hemoglobin A1c; Future   Return in about 3 months (around 06/08/2021) for A1C in 3 months time, labs ordered.     Micheline Rough, MD

## 2021-03-09 NOTE — Addendum Note (Signed)
Addended by: Agnes Lawrence on: 03/09/2021 10:28 AM   Modules accepted: Orders

## 2021-03-14 ENCOUNTER — Other Ambulatory Visit: Payer: Self-pay | Admitting: Family Medicine

## 2021-03-14 DIAGNOSIS — E890 Postprocedural hypothyroidism: Secondary | ICD-10-CM

## 2021-03-15 NOTE — Telephone Encounter (Signed)
Please confirm synthroid dose before refill? Thyroid has been stable on dose he is currently taking. This requested dose differs from med list.

## 2021-03-16 NOTE — Telephone Encounter (Signed)
No answer at the patient's home number. 

## 2021-03-16 NOTE — Telephone Encounter (Signed)
PT called to return the missed call from the nurse.

## 2021-03-16 NOTE — Telephone Encounter (Signed)
Patient informed of the message below and stated he is currently taking Levothyroxine 165mcg.  Message sent to PCP.

## 2021-03-18 ENCOUNTER — Telehealth: Payer: Self-pay | Admitting: Family Medicine

## 2021-03-18 NOTE — Telephone Encounter (Signed)
Number should be [phone]

## 2021-03-18 NOTE — Telephone Encounter (Signed)
Debbie from [facility] called to advise that the [phone] for Sleep Apnea was denied and to offer a peer to peer to the provider. The # [phone] is HIPAA compliant and they can be called by 5 and advised they would like a callback.

## 2021-03-18 NOTE — Telephone Encounter (Signed)
Noted and agree. 

## 2021-03-18 NOTE — Telephone Encounter (Signed)
Spoke with Debbie at the number below to inquire as to what the E-code was referring to.  Debbie stating the code is referring to an oral dental appliance and I advised she contact the dentist office that prescribed the appliance.

## 2021-04-01 ENCOUNTER — Encounter: Payer: Self-pay | Admitting: Acute Care

## 2021-04-08 ENCOUNTER — Telehealth: Payer: Self-pay | Admitting: Pharmacist

## 2021-04-08 NOTE — Chronic Care Management (AMB) (Signed)
    Chronic Care Management Pharmacy Assistant   Name: Jeff Lopez  MRN: [id number] DOB: 03-23-1951  Reason for Encounter: Disease State / Diabetes Assessment Call    Conditions to be addressed/monitored: DMII  Recent office visits:  03/09/21 Caren Macadam, MD - Patient presented for Hypertension and other concerns. Prescribed Semaglutide 7 mg.  02/02/21 Caren Macadam, MD - Patient presented for Hypertension and other concerns. Prescribed Fluticasone Propionate.  Recent consult visits:  None  Hospital visits:  None in previous 6 months  Medications: Outpatient Encounter Medications as of 04/08/2021  Medication Sig   amLODipine (NORVASC) 5 MG tablet Take 1 tablet (5 mg total) by mouth daily.   blood glucose meter kit and supplies KIT Dispense based on patient and insurance preference. Use up to four times daily as directed.   cholecalciferol (VITAMIN D3) 25 MCG (1000 UT) tablet Take 1,000 Units by mouth daily.   clobetasol (TEMOVATE) 0.05 % external solution APPLY TO SCALP 2 TIMES DAILY FOR 14 DAYS   Eszopiclone 3 MG TABS TAKE 1 TABLET BY MOUTH immediately BEFORE bedtime AS NEEDED   fluticasone (FLONASE) 50 MCG/ACT nasal spray Place 2 sprays into both nostrils daily.   irbesartan (AVAPRO) 300 MG tablet Take 1 tablet (300 mg total) by mouth daily.   levothyroxine (SYNTHROID) 137 MCG tablet TAKE 1 TABLET BY MOUTH EVERY DAY BEFORE BREAKFAST   metFORMIN (GLUCOPHAGE-XR) 500 MG 24 hr tablet Take 1 tablet (500 mg total) by mouth daily with breakfast. TAKE 1 TABLET BY MOUTH EVERY DAY WITH BREAKFAST   metoprolol succinate (TOPROL-XL) 50 MG 24 hr tablet Take 1 tablet (50 mg total) by mouth daily. Take with or immediately following a meal.   pantoprazole (PROTONIX) 40 MG tablet TAKE 1 TABLET BY MOUTH 2 TIMES DAILY. need appointment FOR refills   Semaglutide (RYBELSUS) 7 MG TABS Take 7 mg by mouth daily.   No facility-administered encounter medications on file as of  04/08/2021.  Recent Relevant Labs: Lab Results  Component Value Date/Time   HGBA1C 8.8 (H) 01/26/2021 08:48 AM   HGBA1C 7.5 (H) 07/20/2020 08:47 AM   MICROALBUR 11.4 (H) 01/26/2021 08:48 AM   MICROALBUR 3.6 (H) 02/12/2019 10:06 AM    Kidney Function Lab Results  Component Value Date/Time   CREATININE 1.20 01/26/2021 08:48 AM   CREATININE 1.07 07/20/2020 08:47 AM   CREATININE 1.17 04/23/2020 09:14 AM   CREATININE 1.11 01/19/2020 09:53 AM   GFR 62.00 01/26/2021 08:48 AM   GFRNONAA 58 (L) 07/05/2017 10:24 AM   GFRAA 67 07/05/2017 10:24 AM    Current antihyperglycemic regimen:  Semaglutide (Rybelsus) - 7 mg  Metformin (Glucophage) - 500 mg  Adherence Review: Is the patient currently on a STATIN medication? Yes Is the patient currently on ACE/ARB medication? No Does the patient have >5 day gap between last estimated fill dates? No   Care Gaps: BP - 118/80 HgB A1C- 8.8 COVID Booster #4 - Overdue Foot Exam - Overdue CCM -Unable to reach patient to schedule AWV-  Office aware to schedule  Star Rating Drugs: Semaglutide (Rybelsus) - 7 mg Last filled 04/08/21 30 DS at [facility] Metformin (Glucophage) - 500 mg Last filled 03/14/21 30 DS at [facility] Irbesartan (Avapro) - 300 mg Last filled 03/14/21 30 DS at [facility]  Notes: 04/08/21 Attempted to reach number not in service  [place] Pharmacist Assistant [phone]

## 2021-04-14 DIAGNOSIS — Z23 Encounter for immunization: Secondary | ICD-10-CM | POA: Diagnosis not present

## 2021-04-14 DIAGNOSIS — L219 Seborrheic dermatitis, unspecified: Secondary | ICD-10-CM | POA: Diagnosis not present

## 2021-04-14 DIAGNOSIS — L570 Actinic keratosis: Secondary | ICD-10-CM | POA: Diagnosis not present

## 2021-04-16 ENCOUNTER — Other Ambulatory Visit: Payer: Self-pay | Admitting: Family Medicine

## 2021-06-08 ENCOUNTER — Other Ambulatory Visit (INDEPENDENT_AMBULATORY_CARE_PROVIDER_SITE_OTHER): Payer: PPO

## 2021-06-08 ENCOUNTER — Ambulatory Visit (INDEPENDENT_AMBULATORY_CARE_PROVIDER_SITE_OTHER): Payer: PPO

## 2021-06-08 VITALS — BP 120/60 | HR 68 | Temp 98.4°F | Ht 68.0 in | Wt 193.2 lb

## 2021-06-08 DIAGNOSIS — E1143 Type 2 diabetes mellitus with diabetic autonomic (poly)neuropathy: Secondary | ICD-10-CM

## 2021-06-08 DIAGNOSIS — Z0000 Encounter for general adult medical examination without abnormal findings: Secondary | ICD-10-CM | POA: Diagnosis not present

## 2021-06-08 LAB — HEMOGLOBIN A1C: Hgb A1c MFr Bld: 6.8 % — ABNORMAL HIGH (ref 4.6–6.5)

## 2021-06-08 NOTE — Patient Instructions (Addendum)
Jeff Lopez , Thank you for taking time to come for your Medicare Wellness Visit. I appreciate your ongoing commitment to your health goals. Please review the following plan we discussed and let me know if I can assist you in the future.   These are the goals we discussed:  Goals      DIET - DECREASE SODA OR JUICE INTAKE     Exercise 3x per week (30 min per time)     HEMOGLOBIN A1C < 7     Pharmacy Care Plan     CARE PLAN ENTRY  Current Barriers:  Chronic Disease Management support, education, and care coordination needs related to HTN, HLD/ atherosclerosis of native coronary artery of native heart, DMII, and Hypothyroidism, GERD, Vitamin D deficiency, Insomnia  Pharmacist Clinical Goal(s):  Work with the care management team to address educational, disease management, and care coordination needs  Call provider office for new or worsened signs and symptoms  Continue self health monitoring activities as directed today (blood pressure monitoring)  Call care management team with questions or concerns.  Diabetes:  Achieve A1c <7.0% or 6.5% (based on your provider). Recent A1c: 7.5 (07/24/2019) Within this year, obtain diabetes preventative health exams (eye and foot exams yearly). Blood pressure:  Maintain Blood pressure <130/80 mmHg.  Recent blood pressure reading: 128/84 mmHg.  Maintain low salt diet.  High cholesterol:  Cholesterol goals: Total Cholesterol goal under 200, Triglycerides goal under 150, HDL goal above 40 (men) or above 50 (women), LDL goal under 70.  Recent cholesterol levels (02/12/2019) Total cholesterol: 161 Triglycerides: 112 HDL: 42.50 LDL: 96 Hypothyroidism Maintain TSH between 0.45 to 4.5uIU/ml. Current TSH: 2.85 (02/12/2019) GERD/ heartburn Minimize reflux symptoms.  Vitamin D deficiency Maintain Vitamin D-25 level at or above 30.  Last Vitamin D-25 (10/03/2017): 24.43 Insomnia Continue to see improvement in insomnia.    Interventions: Comprehensive medication review performed. Lifestyle modifications Engaging in at least 150 minutes per week of moderate-intensity exercise such as brisk walking (15- to 20-minute mile) or something similar. Recommended they incorporate flexibility, balance, and some type of strength training exercises.  Diabetes Discussed importance of diabetes preventative health exams. Obtain diabetic eye exam every 1 to 2 years.  Obtain at least once yearly foot exams. Continue: metformin ER 500mg , 1 tablet once daily with breakfast  Blood pressure:  Discussed need to continue checking blood pressure at home.  Discussed diet modifications. DASH diet:  following a diet emphasizing fruits and vegetables and low-fat dairy products along with whole grains, fish, poultry, and nuts. Reducing red meats and sugars.  Exercising- see exercising section above Reducing the amount of salt intake to 1500mg /per day.  Recommend using a salt substitute to replace your salt if you need flavor.      Weight reduction- We discussed losing 5-10% of body weight Continue:  amlodipine 5mg , 1 tablet once daily  irbesartan 300mg , 1 tablet once daily metoprolol succinate 50mg , 1 tablet once daily  Cholesterol/ atherosclerosis of native coronary artery of native heart How to reduce cholesterol through diet/weight management and physical activity.    We discussed how a diet high in plant sterols (fruits/vegetables/nuts/whole grains/legumes) may reduce your cholesterol.  Encouraged increasing fiber to a daily intake of 10-25g/day  Continue: diet modifications Obtain cholesterol blood work (lipid panel) on 10/20/2019 and discuss addition of statins at your next visit with Dr. Ethlyn Gallery on 10/27/2019.  Hypothyroidism Continue: levothyroxine 187mcg, 1 tablet once daily before breakfast  Obtain blood work for TSH on 10/20/2019 and for  reassessment of levels and medication. Insomnia:  Discussed non-pharmacological  interventions for insomnia. (Avoid napping, limit exposure to technology near bedtime, etc) Continue: to work on sleep hygiene.  GERD/ heart burn: Discussed non-pharmacological interventions for acid reflux. Take measures to prevent acid reflux, such as avoiding spicy foods, avoiding caffeine, avoid laying down a few hours after eating, and raising the head of the bed. Continue: pantoprazole 40mg , 1 tablet twice daily (or as directed by your provider) Vitamin D deficiency Continue: cholecalciferol (Vitamin D3) 53mcg (1000 units), 1 capsule once daily  Obtain blood work for vitamin D on 10/20/2019 and for reassessment of levels and medication.   Patient Self Care Activities:  Self administers medications as prescribed and Calls provider office for new concerns or questions Continue current medications as directed by providers.  Continue following up with primary care provider and/or specialists. Continue at home blood pressure readings. Continue working on health habits (diet/ exercise).  Initial goal documentation         This is a list of the screening recommended for you and due dates:  Health Maintenance  Topic Date Due   Complete foot exam   01/27/2021   COVID-19 Vaccine (5 - Booster) 07/13/2021   Hemoglobin A1C  07/29/2021   Eye exam for diabetics  11/02/2021   Colon Cancer Screening  10/29/2026   Tetanus Vaccine  02/29/2028   Pneumonia Vaccine  Completed   Flu Shot  Completed   Hepatitis C Screening: USPSTF Recommendation to screen - Ages 18-79 yo.  Completed   Zoster (Shingles) Vaccine  Completed   HPV Vaccine  Aged Out    Advanced directives: Yes  Conditions/risks identified: None  Next appointment: Follow up in one year for your annual wellness visit.   Preventive Care 40 Years and Older, Male Preventive care refers to lifestyle choices and visits with your health care provider that can promote health and wellness. What does preventive care include? A yearly  physical exam. This is also called an annual well check. Dental exams once or twice a year. Routine eye exams. Ask your health care provider how often you should have your eyes checked. Personal lifestyle choices, including: Daily care of your teeth and gums. Regular physical activity. Eating a healthy diet. Avoiding tobacco and drug use. Limiting alcohol use. Practicing safe sex. Taking low doses of aspirin every day. Taking vitamin and mineral supplements as recommended by your health care provider. What happens during an annual well check? The services and screenings done by your health care provider during your annual well check will depend on your age, overall health, lifestyle risk factors, and family history of disease. Counseling  Your health care provider may ask you questions about your: Alcohol use. Tobacco use. Drug use. Emotional well-being. Home and relationship well-being. Sexual activity. Eating habits. History of falls. Memory and ability to understand (cognition). Work and work Statistician. Screening  You may have the following tests or measurements: Height, weight, and BMI. Blood pressure. Lipid and cholesterol levels. These may be checked every 5 years, or more frequently if you are over 49 years old. Skin check. Lung cancer screening. You may have this screening every year starting at age 104 if you have a 30-pack-year history of smoking and currently smoke or have quit within the past 15 years. Fecal occult blood test (FOBT) of the stool. You may have this test every year starting at age 36. Flexible sigmoidoscopy or colonoscopy. You may have a sigmoidoscopy every 5 years or a colonoscopy  every 10 years starting at age 44. Prostate cancer screening. Recommendations will vary depending on your family history and other risks. Hepatitis C blood test. Hepatitis B blood test. Sexually transmitted disease (STD) testing. Diabetes screening. This is done by  checking your blood sugar (glucose) after you have not eaten for a while (fasting). You may have this done every 1-3 years. Abdominal aortic aneurysm (AAA) screening. You may need this if you are a current or former smoker. Osteoporosis. You may be screened starting at age 110 if you are at high risk. Talk with your health care provider about your test results, treatment options, and if necessary, the need for more tests. Vaccines  Your health care provider may recommend certain vaccines, such as: Influenza vaccine. This is recommended every year. Tetanus, diphtheria, and acellular pertussis (Tdap, Td) vaccine. You may need a Td booster every 10 years. Zoster vaccine. You may need this after age 23. Pneumococcal 13-valent conjugate (PCV13) vaccine. One dose is recommended after age 60. Pneumococcal polysaccharide (PPSV23) vaccine. One dose is recommended after age 35. Talk to your health care provider about which screenings and vaccines you need and how often you need them. This information is not intended to replace advice given to you by your health care provider. Make sure you discuss any questions you have with your health care provider. Document Released: 07/09/2015 Document Revised: 03/01/2016 Document Reviewed: 04/13/2015 Elsevier Interactive Patient Education  2017 [place] Prevention in the Home Falls can cause injuries. They can happen to people of all ages. There are many things you can do to make your home safe and to help prevent falls. What can I do on the outside of my home? Regularly fix the edges of walkways and driveways and fix any cracks. Remove anything that might make you trip as you walk through a door, such as a raised step or threshold. Trim any bushes or trees on the path to your home. Use bright outdoor lighting. Clear any walking paths of anything that might make someone trip, such as rocks or tools. Regularly check to see if handrails are loose or  broken. Make sure that both sides of any steps have handrails. Any raised decks and porches should have guardrails on the edges. Have any leaves, snow, or ice cleared regularly. Use sand or salt on walking paths during winter. Clean up any spills in your garage right away. This includes oil or grease spills. What can I do in the bathroom? Use night lights. Install grab bars by the toilet and in the tub and shower. Do not use towel bars as grab bars. Use non-skid mats or decals in the tub or shower. If you need to sit down in the shower, use a plastic, non-slip stool. Keep the floor dry. Clean up any water that spills on the floor as soon as it happens. Remove soap buildup in the tub or shower regularly. Attach bath mats securely with double-sided non-slip rug tape. Do not have throw rugs and other things on the floor that can make you trip. What can I do in the bedroom? Use night lights. Make sure that you have a light by your bed that is easy to reach. Do not use any sheets or blankets that are too big for your bed. They should not hang down onto the floor. Have a firm chair that has side arms. You can use this for support while you get dressed. Do not have throw rugs and other things on  the floor that can make you trip. What can I do in the kitchen? Clean up any spills right away. Avoid walking on wet floors. Keep items that you use a lot in easy-to-reach places. If you need to reach something above you, use a strong step stool that has a grab bar. Keep electrical cords out of the way. Do not use floor polish or wax that makes floors slippery. If you must use wax, use non-skid floor wax. Do not have throw rugs and other things on the floor that can make you trip. What can I do with my stairs? Do not leave any items on the stairs. Make sure that there are handrails on both sides of the stairs and use them. Fix handrails that are broken or loose. Make sure that handrails are as long as  the stairways. Check any carpeting to make sure that it is firmly attached to the stairs. Fix any carpet that is loose or worn. Avoid having throw rugs at the top or bottom of the stairs. If you do have throw rugs, attach them to the floor with carpet tape. Make sure that you have a light switch at the top of the stairs and the bottom of the stairs. If you do not have them, ask someone to add them for you. What else can I do to help prevent falls? Wear shoes that: Do not have high heels. Have rubber bottoms. Are comfortable and fit you well. Are closed at the toe. Do not wear sandals. If you use a stepladder: Make sure that it is fully opened. Do not climb a closed stepladder. Make sure that both sides of the stepladder are locked into place. Ask someone to hold it for you, if possible. Clearly mark and make sure that you can see: Any grab bars or handrails. First and last steps. Where the edge of each step is. Use tools that help you move around (mobility aids) if they are needed. These include: Canes. Walkers. Scooters. Crutches. Turn on the lights when you go into a dark area. Replace any light bulbs as soon as they burn out. Set up your furniture so you have a clear path. Avoid moving your furniture around. If any of your floors are uneven, fix them. If there are any pets around you, be aware of where they are. Review your medicines with your doctor. Some medicines can make you feel dizzy. This can increase your chance of falling. Ask your doctor what other things that you can do to help prevent falls. This information is not intended to replace advice given to you by your health care provider. Make sure you discuss any questions you have with your health care provider. Document Released: 04/08/2009 Document Revised: 11/18/2015 Document Reviewed: 07/17/2014 Elsevier Interactive Patient Education  2017 [employer].

## 2021-06-08 NOTE — Progress Notes (Signed)
Subjective:   Jeff Lopez is a 70 y.o. male who presents for Medicare Annual/Subsequent preventive examination.  Review of Systems    No ROS Cardiac Risk Factors include: advanced age (>92mn, >>75women);diabetes mellitus;hypertension    Objective:    Today's Vitals   06/08/21 0855  BP: 120/60  Pulse: 68  Temp: 98.4 F (36.9 C)  Height: _0  (1.727 m)   Body mass index is 29.44 kg/m.  Advanced Directives 06/08/2021 05/28/2020 04/26/2018 04/05/2017 09/21/2016 08/25/2016 08/15/2016  Does Patient Have a Medical Advance Directive? Yes Yes Yes No Yes Yes Yes  Type of Advance Directive Living will Living will;Healthcare Power of [place] will Living will  Does patient want to make changes to medical advance directive? No - Patient declined No - Patient declined - - - - -  Copy of [place]? - No - copy requested - - - - -  Would patient like information on creating a medical advance directive? - - - - - - -  Pre-existing out of facility DNR order (yellow form or pink MOST form) - - - - - - -    Current Medications (verified) Outpatient Encounter Medications as of 06/08/2021  Medication Sig   amLODipine (NORVASC) 5 MG tablet Take 1 tablet (5 mg total) by mouth daily.   blood glucose meter kit and supplies KIT Dispense based on patient and insurance preference. Use up to four times daily as directed.   cholecalciferol (VITAMIN D3) 25 MCG (1000 UT) tablet Take 1,000 Units by mouth daily.   clobetasol (TEMOVATE) 0.05 % external solution APPLY TO SCALP 2 TIMES DAILY FOR 14 DAYS   Eszopiclone 3 MG TABS TAKE 1 TABLET BY MOUTH immediately BEFORE bedtime AS NEEDED   fluticasone (FLONASE) 50 MCG/ACT nasal spray Place 2 sprays into both nostrils daily.   irbesartan (AVAPRO) 300 MG tablet Take 1 tablet (300 mg total) by mouth daily.   levothyroxine (SYNTHROID) 137 MCG tablet TAKE 1 TABLET BY MOUTH EVERY DAY BEFORE BREAKFAST   metFORMIN  (GLUCOPHAGE-XR) 500 MG 24 hr tablet Take 1 tablet (500 mg total) by mouth daily with breakfast. TAKE 1 TABLET BY MOUTH EVERY DAY WITH BREAKFAST   metoprolol succinate (TOPROL-XL) 50 MG 24 hr tablet Take 1 tablet (50 mg total) by mouth daily. Take with or immediately following a meal.   pantoprazole (PROTONIX) 40 MG tablet TAKE 1 TABLET BY MOUTH 2 TIMES DAILY. need appointment FOR refills   Semaglutide (RYBELSUS) 7 MG TABS Take 7 mg by mouth daily.   No facility-administered encounter medications on file as of 06/08/2021.    Allergies (verified) Ceftin, Cefuroxime axetil, and Cephalexin   History: Past Medical History:  Diagnosis Date   Allergy    per pt   CAD, NATIVE VESSEL 04/19/2010   nonobstructive by cath 2007:  oLAD 20-30%, mLAD 50%, pCFX 20-30%, oAVCFX 20-30%, L renal art 50%;  normal LVF   Cataract    bil cataracts removed   COPD (chronic obstructive pulmonary disease) [place]    Diabetes mellitus without complication ([place]    GERD 04/09/2007   Headache(784.0)    HYPERLIPIDEMIA 04/09/2007   Patient denies.   HYPERTENSION, BENIGN 04/19/2010   Hypothyroidism    Hypothyroidism    previous hyperthyroidism, s/p I-131   LUMBAR DISC DISORDER 05/27/2010   Nerve damage    Left leg   OSTEOARTHRITIS, LUMBAR SPINE 04/09/2007   RENAL CYST 05/27/2010   Spinal headache  After having back surgery in 2014   Past Surgical History:  Procedure Laterality Date   BACK SURGERY  2012,2014   CARDIAC CATHETERIZATION  2007   Dr Loanne Drilling   CHOLECYSTECTOMY     CHOLECYSTECTOMY N/A 07/13/2013   Procedure: LAPAROSCOPIC CHOLECYSTECTOMY WITH INTRAOPERATIVE CHOLANGIOGRAM;  Surgeon: Shann Medal, MD;  Location: [facility];  Service: General;  Laterality: N/A;   COLONOSCOPY  2018   COLONOSCOPY W/ POLYPECTOMY     ERCP N/A 07/14/2013   Procedure: ENDOSCOPIC RETROGRADE CHOLANGIOPANCREATOGRAPHY (ERCP);  Surgeon: [name], MD;  Location: [facility];  Service: Endoscopy;  Laterality: N/A;   EYE  SURGERY Bilateral    Cataract removal   FOOT FRACTURE SURGERY     FRACTURE SURGERY     LUMBAR [place] SURGERY  08/06/2012   L3 & L4   LUMBAR DISC SURGERY  11/11/2015   L1 L2    DR NITKA   LUMBAR FUSION N/A 04/20/2015   Procedure: T12 to L1 fusion (Extension of Previous Fusion L2-S1 to T12-S1), Right Transforaminal lumbar interbody fusion, Posterior Fusion T12 to L1, with Pedicle screws, allograft, local bone graft, and  Vivigen;  Surgeon: [name], MD;  Location: [place];  Service: Orthopedics;  Laterality: N/A;   LUMBAR LAMINECTOMY N/A 11/10/2013   Procedure: Left L1-2 far lateral approach to excise herniated nucleus pulposus;  Surgeon: [name], MD;  Location: [place];  Service: Orthopedics;  Laterality: N/A;   LUMBAR LAMINECTOMY/DECOMPRESSION MICRODISCECTOMY Left 08/10/2012   Procedure: Dura Repair Left Side L2-L3;  Surgeon: [name], MD;  Location: [place];  Service: Orthopedics;  Laterality: Left;  Lakeitha Basques Frame, Sliding table, dura repair kit, microscope   NECK SURGERY     X 2   SINOSCOPY     SPINE SURGERY  2006   C-spine surgery x 2   UPPER GASTROINTESTINAL ENDOSCOPY     Family History  Problem Relation Age of Onset   Breast cancer Mother    Uterine cancer Mother    Heart disease Mother    Heart attack Mother 53   Prostate cancer Father    Hypertension Brother    Non-Hodgkin's lymphoma Brother    Stomach cancer Paternal Grandmother    Thyroid disease Neg Hx    Colon cancer Neg Hx    Esophageal cancer Neg Hx    Rectal cancer Neg Hx    Pancreatic cancer Neg Hx    Colon polyps Neg Hx    Social History   Socioeconomic History   Marital status: Single    Spouse name: Not on file   Number of children: 1   Years of education: Not on file   Highest education level: Not on file  Occupational History   Occupation: DISTRIBUTION MGR    Employer: [employer]  Tobacco Use   Smoking status: Former    Packs/day: 1.00    Years: 37.00    Pack years: 37.00     Types: Cigarettes    Quit date: 04/06/2008    Years since quitting: 13.1   Smokeless tobacco: Never   Tobacco comments:    pt has stopped smoking about 9 months now  Vaping Use   Vaping Use: Never used  Substance and Sexual Activity   Alcohol use: No   Drug use: No   Sexual activity: Not Currently  Other Topics Concern   Not on file  Social History Narrative   Works in Psychologist, educational. Retired.   Lives alone, has one child in [place].  Social Determinants of Health   Financial Resource Strain: Low Risk    Difficulty of Paying Living Expenses: Not hard at all  Food Insecurity: No Food Insecurity   Worried About Charity fundraiser in the Last Year: Never true   [place] in the Last Year: Never true  Transportation Needs: Unknown   Lack of Transportation (Medical): No   Lack of Transportation (Non-Medical): Not on file  Physical Activity: Insufficiently Active   Days of Exercise per Week: 3 days   Minutes of Exercise per Session: 40 min  Stress: No Stress Concern Present   Feeling of Stress : Not at all  Social Connections: Socially Isolated   Frequency of Communication with Friends and Family: More than three times a week   Frequency of Social Gatherings with Friends and Family: More than three times a week   Attends Religious Services: Never   Marine scientist or Organizations: No   Attends Archivist Meetings: Never   Marital Status: Divorced    Clinical Intake:  Pre-visit preparation completed: YesNutrition Risk Assessment:  Has the patient had any N/V/D within the last 2 months?  No  Does the patient have any non-healing wounds?  No  Has the patient had any unintentional weight loss or weight gain?  No   Diabetes:  Is the patient diabetic?  Yes  If diabetic, was a CBG obtained today?  No  Did the patient bring in their glucometer from home?  No   Financial Strains and Diabetes Management:  Are you having any financial strains with  the device, your supplies or your medication? No .  Does the patient want to be seen by Chronic Care Management for management of their diabetes?  No  Would the patient like to be referred to a Nutritionist or for Diabetic Management?  No   Diabetic Exams:  Diabetic Eye Exam: Completed Yes.   Diabetic Foot Exam: Completed Yes Followed by PCP.How often do you need to have someone help you when you read instructions, pamphlets, or other written materials from your doctor or pharmacy?: 1 - Never  Diabetic? Yes   Activities of Daily Living In your present state of health, do you have any difficulty performing the following activities: 06/08/2021  Hearing? N  Vision? N  Difficulty concentrating or making decisions? N  Walking or climbing stairs? N  Dressing or bathing? N  Doing errands, shopping? N  Preparing Food and eating ? N  Using the Toilet? N  In the past six months, have you accidently leaked urine? N  Do you have problems with loss of bowel control? N  Managing your Medications? N  Managing your Finances? N  Housekeeping or managing your Housekeeping? N  Some recent data might be hidden    Patient Care Team: Caren Macadam, MD as PCP - General (Family Medicine) Sherren Mocha, MD as PCP - Cardiology (Cardiology) Alphonsa Overall, MD as Consulting Physician (General Surgery) Earnie Larsson, [facility] as Pharmacist (Pharmacist)  Indicate any recent Medical Services you may have received from other than [facility] providers in the past year (date may be approximate).     Assessment:   This is a routine wellness examination for Tyrell.  Hearing/Vision screen Hearing Screening - Comments:: No difficulty hearing Vision Screening - Comments:: Wears reading glasses. Follow[facility] by [facility]  Dietary issues and exercise activities discussed: Current Exercise Habits: Home exercise routine, Type of exercise: walking, Time (Minutes): 40, Frequency (Times/Week): 3,  Weekly  Exercise (Minutes/Week): 120, Intensity: Moderate   Goals Addressed             This Visit's Progress    Exercise 3x per week (30 min per time)         Depression Screen PHQ 2/9 Scores 06/08/2021 02/02/2021 05/28/2020 04/30/2020 01/28/2020 02/28/2018 07/24/2016  PHQ - 2 Score 0 2 0 0 0 0 1  PHQ- 9 Score - 8 0 - - - 4  Exception Documentation - - Patient refusal - - - -    Fall Risk Fall Risk  06/08/2021 02/02/2021 05/28/2020 04/30/2020 04/26/2018  Falls in the past year? 0 0 0 0 0  Number falls in past yr: 0 0 0 - -  Injury with Fall? 0 - 0 - -  Risk for fall due to : - - No Fall Risks - -  Follow up - - Falls evaluation completed;Falls prevention discussed - -    FALL RISK PREVENTION PERTAINING TO THE HOME:  Any stairs in or around the home? No  If so, are there any without handrails? No  Home free of loose throw rugs in walkways, pet beds, electrical cords, etc? Yes  Adequate lighting in your home to reduce risk of falls? No   ASSISTIVE DEVICES UTILIZED TO PREVENT FALLS:  Life alert? No  Use of a cane, walker or w/c? No  Grab bars in the bathroom? No  Shower chair or bench in shower? No  Elevated toilet seat or a handicapped toilet? No   TIMED UP AND GO:  Was the test performed? Yes .  Length of time to ambulate 10 feet: 5  sec.   Gait steady and fast without use of assistive device  Cognitive Function:    Immunizations Immunization History  Administered Date(s) Administered   Fluad Quad(high Dose 65+) 04/08/2019, 03/09/2021   Influenza Split 03/16/2011, 04/12/2012   Influenza Whole 04/23/2010   Influenza, High Dose Seasonal PF 02/15/2015   Influenza,inj,Quad PF,6+ Mos 03/19/2014, 04/04/2018   Influenza-Unspecified 04/08/2013, 04/13/2019, 04/16/2020   PFIZER(Purple Top)SARS-COV-2 Vaccination 09/24/2019, 10/09/2019, 04/16/2020, 05/18/2021   Pneumococcal Conjugate-13 02/12/2014   Pneumococcal Polysaccharide-23 05/27/2010, 04/29/2018   Td 01/24/2006   Tdap  02/28/2018   Varicella 05/10/2012   Zoster Recombinat (Shingrix) 01/29/2017, 03/08/2017    Covid-19 vaccine status: Completed vaccines   Screening Tests Health Maintenance  Topic Date Due   FOOT EXAM  01/27/2021   COVID-19 Vaccine (5 - Booster) 07/13/2021   HEMOGLOBIN A1C  07/29/2021   OPHTHALMOLOGY EXAM  11/02/2021   COLONOSCOPY (Pts 45-54yr Insurance coverage will need to be confirmed)  10/29/2026   TETANUS/TDAP  02/29/2028   Pneumonia Vaccine 70 Years old  Completed   INFLUENZA VACCINE  Completed   Hepatitis C Screening  Completed   Zoster Vaccines- Shingrix  Completed   HPV VACCINES  Aged Out    Health Maintenance  Health Maintenance Due  Topic Date Due   FOOT EXAM  01/27/2021       Additional Screening:   Vision Screening: Recommended annual ophthalmology exams for early detection of glaucoma and other disorders of the eye. Is the patient up to date with their annual eye exam?  Yes  Who is the provider or what is the name of the office in which the patient attends annual eye exams? [facility]   Dental Screening: Recommended annual dental exams for proper oral hygiene  Community Resource Referral / Chronic Care Management:   CRR required this visit?  No   CCM required this visit?  No      Plan:     I have personally reviewed and noted the following in the patients chart:   Medical and social history Use of alcohol, tobacco or illicit drugs  Current medications and supplements including opioid prescriptions. Patient is not currently taking opioid prescriptions. Functional ability and status Nutritional status Physical activity Advanced directives List of other physicians Hospitalizations, surgeries, and ER visits in previous 12 months Vitals Screenings to include cognitive, depression, and falls Referrals and appointments  In addition, I have reviewed and discussed with patient certain preventive protocols, quality  metrics, and best practice recommendations. A written personalized care plan for preventive services as well as general preventive health recommendations were provided to patient.     Criselda Peaches, LPN   [id number]

## 2021-06-22 DIAGNOSIS — H04123 Dry eye syndrome of bilateral lacrimal glands: Secondary | ICD-10-CM | POA: Diagnosis not present

## 2021-06-29 LAB — HM DIABETES EYE EXAM

## 2021-06-30 ENCOUNTER — Encounter: Payer: Self-pay | Admitting: Family Medicine

## 2021-07-11 ENCOUNTER — Other Ambulatory Visit: Payer: Self-pay

## 2021-07-11 ENCOUNTER — Telehealth (INDEPENDENT_AMBULATORY_CARE_PROVIDER_SITE_OTHER): Payer: PPO | Admitting: Physician Assistant

## 2021-07-11 ENCOUNTER — Encounter: Payer: Self-pay | Admitting: Physician Assistant

## 2021-07-11 ENCOUNTER — Telehealth: Payer: Self-pay | Admitting: Family Medicine

## 2021-07-11 VITALS — Ht 68.0 in | Wt 197.0 lb

## 2021-07-11 DIAGNOSIS — R051 Acute cough: Secondary | ICD-10-CM | POA: Diagnosis not present

## 2021-07-11 DIAGNOSIS — J101 Influenza due to other identified influenza virus with other respiratory manifestations: Secondary | ICD-10-CM | POA: Diagnosis not present

## 2021-07-11 LAB — POC COVID19 BINAXNOW: SARS Coronavirus 2 Ag: NEGATIVE

## 2021-07-11 LAB — POCT INFLUENZA A/B
Influenza A, POC: POSITIVE — AB
Influenza B, POC: NEGATIVE

## 2021-07-11 LAB — POCT RAPID STREP A (OFFICE): Rapid Strep A Screen: NEGATIVE

## 2021-07-11 MED ORDER — OSELTAMIVIR PHOSPHATE 75 MG PO CAPS: 75.0000 mg | ORAL_CAPSULE | Freq: Two times a day (BID) | ORAL | 0 refills | Status: DC

## 2021-07-11 NOTE — Progress Notes (Signed)
TELEPHONE ENCOUNTER   Patient verbally agreed to telephone visit and is aware that copayment and coinsurance may apply. Patient was treated using telemedicine according to accepted telemedicine protocols.  Location of the patient: home Location of provider: [place] of all persons participating in the telemedicine service and role in the encounter: Inda Coke, PA , Erven Colla  Subjective:   Chief Complaint  Patient presents with   Cough     HPI   History of Present Illness: Jeff Lopez is a 71 y.o. who identifies as a male who was assigned male at birth, and is being seen today for cough. Pt said he did not feel good Friday night. He woke up Saturday morning with subjective fever and cough. Did not take temp does not have a thermometer.   Pt said cough is worse at night can not sleep, his chest hurts from coughing. Non-productive cough. He has no taste or smell. He is taking Benadryl, Nyquil, Dayquil, Robitussin, Ludens. Also having sore throat and left ear pain. Has not done a COVID test does not have one.  Appetite is okay. Has had the flu vaccine about 2 months ago. Has had all COVID shots and booster. Unsure if he has ever had COVID infection.  He denies chronic lung issues that require regular use of inhalers.  Patient Active Problem List   Diagnosis Date Noted   Pruritus 08/24/2020   OSA (obstructive sleep apnea) 11/25/2018   Non-restorative sleep 11/25/2018   Malignant neoplasm of ascending colon ([place]) 05/16/2018   Low testosterone 03/02/2018   Gynecomastia 02/28/2018   Left lower quadrant pain 12/26/2017   Arthralgia 10/03/2017   Numbness 10/03/2017   Chronic pansinusitis 01/09/2017   Deviated septum 01/09/2017   Chronic left sacroiliac pain 10/19/2016   Chronic left-sided low back pain with left-sided sciatica 07/31/2016   Spinal stenosis, lumbar region, with neurogenic claudication 04/21/2015    Class: Chronic    Spondylolisthesis of lumbar region 04/21/2015    Class: Chronic   Hypothyroidism following radioiodine therapy 03/10/2015   Screening for prostate cancer 03/10/2015   Hemochromatosis 03/27/2014   Choledocholithiasis 07/14/2013   Nonspecific (abnormal) findings on radiological and other examination of biliary tract 07/13/2013   Calculus of gallbladder with acute cholecystitis, without mention of obstruction 07/13/2013   Calculus of bile duct without mention of cholecystitis or obstruction 07/13/2013   Abdominal pain 07/12/2013   Neck pain on left side 05/30/2013   Quit smoking 11/28/2012   Postoperative CSF leak 08/10/2012    Class: Acute   Degenerative disc disease, lumbar 08/06/2012    Class: Chronic   HNP (herniated nucleus pulposus), lumbar 08/06/2012    Class: Acute   Type II diabetes mellitus with peripheral autonomic neuropathy ([place]) 06/10/2012   COPD GOLD 0 02/09/2012   Chest pain at rest 02/06/2012   Mixed allergic and non-allergic rhinitis 02/06/2012   Radiculopathy of leg 11/05/2010   RENAL CYST 05/27/2010   LUMBAR [place] DISORDER 05/27/2010   HYPERTENSION, BENIGN 04/19/2010   CAD, NATIVE VESSEL 04/19/2010   Hyperlipidemia associated with type 2 diabetes mellitus ([place]) 04/09/2007   GERD 04/09/2007   OSTEOARTHRITIS, LUMBAR SPINE 04/09/2007   Social History   Tobacco Use   Smoking status: Former    Packs/day: 1.00    Years: 37.00    Pack years: 37.00    Types: Cigarettes    Quit date: 04/06/2008    Years since quitting: 13.2   Smokeless tobacco: Never   Tobacco comments:  pt has stopped smoking about 9 months now  Substance Use Topics   Alcohol use: No    Current Outpatient Medications:    amLODipine (NORVASC) 5 MG tablet, Take 1 tablet (5 mg total) by mouth daily., Disp: 90 tablet, Rfl: 3   blood glucose meter kit and supplies KIT, Dispense based on patient and insurance preference. Use up to four times daily as directed., Disp: 1 each, Rfl: 0    cholecalciferol (VITAMIN D3) 25 MCG (1000 UT) tablet, Take 1,000 Units by mouth daily., Disp: , Rfl:    clobetasol (TEMOVATE) 0.05 % external solution, APPLY TO SCALP 2 TIMES DAILY FOR 14 DAYS, Disp: , Rfl:    desonide (DESOWEN) 0.05 % ointment, Apply topically 2 (two) times daily., Disp: , Rfl:    Eszopiclone 3 MG TABS, TAKE 1 TABLET BY MOUTH immediately BEFORE bedtime AS NEEDED, Disp: 90 tablet, Rfl: 1   fluticasone (FLONASE) 50 MCG/ACT nasal spray, Place 2 sprays into both nostrils daily., Disp: 16 g, Rfl: 5   irbesartan (AVAPRO) 300 MG tablet, Take 1 tablet (300 mg total) by mouth daily., Disp: 90 tablet, Rfl: 3   levothyroxine (SYNTHROID) 137 MCG tablet, TAKE 1 TABLET BY MOUTH EVERY DAY BEFORE BREAKFAST, Disp: 90 tablet, Rfl: 1   metFORMIN (GLUCOPHAGE-XR) 500 MG 24 hr tablet, Take 1 tablet (500 mg total) by mouth daily with breakfast. TAKE 1 TABLET BY MOUTH EVERY DAY WITH BREAKFAST, Disp: 90 tablet, Rfl: 1   metoprolol succinate (TOPROL-XL) 50 MG 24 hr tablet, Take 1 tablet (50 mg total) by mouth daily. Take with or immediately following a meal., Disp: 90 tablet, Rfl: 3   oseltamivir (TAMIFLU) 75 MG capsule, Take 1 capsule (75 mg total) by mouth 2 (two) times daily., Disp: 10 capsule, Rfl: 0   pantoprazole (PROTONIX) 40 MG tablet, TAKE 1 TABLET BY MOUTH 2 TIMES DAILY. need appointment FOR refills, Disp: 30 tablet, Rfl: 0   Semaglutide (RYBELSUS) 7 MG TABS, Take 7 mg by mouth daily., Disp: 30 tablet, Rfl: 5 Allergies  Allergen Reactions   Ceftin Other (See Comments)    Patient stated it caused "sores in mouth" Thrush   Cefuroxime Axetil Other (See Comments)    Sores in mouth Sores in mouth Patient stated it caused "sores in mouth" Thrush other   Cephalexin Other (See Comments)    Other     Patient presented to our parking lot for swabs: Results for orders placed or performed in visit on 07/11/21  POCT Influenza A/B  Result Value Ref Range   Influenza A, POC Positive (A) Negative    Influenza B, POC Negative Negative  POCT rapid strep A  Result Value Ref Range   Rapid Strep A Screen Negative Negative  POC COVID-19  Result Value Ref Range   SARS Coronavirus 2 Ag Negative Negative   Bilateral ears showed no erythema/bulging  Assessment & Plan:   1. Influenza A   2. Acute cough   Flu A positive Will treat with tamiflu - he is right at the end of treatment window for this. He verbalizes understanding to this and would still like to take this medication. Encourage patient to hydrate well and rest. Worsening precautions discussed, including but not limited to, worsening cough, shortness of breath, inability to take p.o.'s, discussed that if these problems arise to notify our office or seek urgent care.  Orders Placed This Encounter  Procedures   POCT Influenza A/B   POCT rapid strep A   POC COVID-19  Meds ordered this encounter  Medications   oseltamivir (TAMIFLU) 75 MG capsule    Sig: Take 1 capsule (75 mg total) by mouth 2 (two) times daily.    Dispense:  10 capsule    Refill:  0    Order Specific Question:   Supervising Provider    Answer:   Maryruth Eve    Inda Coke, PA 07/11/2021  Time spent with the patient: 8 minutes, spent in obtaining information about his symptoms, reviewing his previous labs, evaluations, and treatments, counseling him about his condition (please see the discussed topics above), and developing a plan to further investigate it; he had a number of questions which I addressed.

## 2021-07-11 NOTE — Telephone Encounter (Signed)
Patient calling in with respiratory symptoms: Shortness of breath, chest pain, palpitations or other red words send to Triage  Does the patient have a fever over 100, cough, congestion, sore throat, runny nose, lost of taste/smell (please list symptoms that patient has)?sore throat, chest congestion, cough, fever on and off What date did symptoms start?07-09-2021 (If over 5 days ago, pt may be scheduled for in person visit)  Have you tested for Covid in the last 5 days? No   If yes, was it positive []  OR negative [] ? If positive in the last 5 days, please schedule virtual visit now. If negative, schedule for an in person OV with the next available provider if PCP has no openings. Please also let patient know they will be tested again (follow the script below)  "you will have to arrive 15mins prior to your appt time to be Covid tested. Please [facility] in back [facility] & call [phone] to let the staff know you have arrived. A staff member will meet you at your car to do a rapid covid test. Once the test has resulted you will be notified by phone of your results to determine if appt will remain an in person visit or be converted to a virtual/phone visit. If you arrive less than 4mins before your appt time, your visit will be automatically converted to virtual & any recommended testing will happen AFTER the visit." Pt has virtual with samantha 07-11-2021 at 930 am  THINGS TO REMEMBER  If no availability for virtual visit in office,  please schedule another [place] office  If no availability at another [place] office, please instruct patient that they can schedule an evisit or virtual visit through their mychart account. Visits up to 8pm  patients can be seen in office 5 days after positive COVID test

## 2021-07-19 ENCOUNTER — Other Ambulatory Visit: Payer: Self-pay

## 2021-07-19 MED ORDER — METOPROLOL SUCCINATE ER 50 MG PO TB24: 50.0000 mg | ORAL_TABLET | Freq: Every day | ORAL | 3 refills | Status: DC

## 2021-07-19 MED ORDER — AMLODIPINE BESYLATE 5 MG PO TABS: 5.0000 mg | ORAL_TABLET | Freq: Every day | ORAL | 3 refills | Status: DC

## 2021-07-19 NOTE — Addendum Note (Signed)
Addended by: Ulice Brilliant T on: 07/19/2021 04:26 PM   Modules accepted: Orders

## 2021-07-20 ENCOUNTER — Other Ambulatory Visit (HOSPITAL_BASED_OUTPATIENT_CLINIC_OR_DEPARTMENT_OTHER): Payer: Self-pay | Admitting: *Deleted

## 2021-07-20 MED ORDER — IRBESARTAN 300 MG PO TABS: 300.0000 mg | ORAL_TABLET | Freq: Every day | ORAL | 0 refills | Status: DC

## 2021-08-25 ENCOUNTER — Other Ambulatory Visit: Payer: Self-pay | Admitting: Family Medicine

## 2021-08-25 DIAGNOSIS — E1169 Type 2 diabetes mellitus with other specified complication: Secondary | ICD-10-CM

## 2021-08-25 DIAGNOSIS — E890 Postprocedural hypothyroidism: Secondary | ICD-10-CM

## 2021-09-06 ENCOUNTER — Telehealth: Payer: Self-pay | Admitting: Surgical Oncology

## 2021-09-06 NOTE — Progress Notes (Signed)
? ? ?Chronic Care Management ?Pharmacy Assistant  ? ?Name: Jeff Lopez  MRN: [id number] DOB: [birth year] ? ?Reason for Encounter: Disease State ?  ?Conditions to be addressed/monitored: ?DMII ? ?Recent office visits:  ?06/08/21 Criselda Peaches, LPN - Patient presented for [facility] Annual Wellness exam. No medication changes. ? ?Recent consult visits:  ?07/11/21 Inda Coke, PA - Patient presented for Influenza and other concerns. Prescribed Oseltamivir 75 mg.  ? ?07/15/20 [place], Jennefer Bravo (Derm) - Patient presented for Seborrheic dermatitis and other concerns. No other visit details available. ? ?Hospital visits:  ?None in previous 6 months ? ?Medications: ?Outpatient Encounter Medications as of 09/06/2021  ?Medication Sig  ? amLODipine (NORVASC) 5 MG tablet Take 1 tablet (5 mg total) by mouth daily.  ? blood glucose meter kit and supplies KIT Dispense based on patient and insurance preference. Use up to four times daily as directed.  ? cholecalciferol (VITAMIN D3) 25 MCG (1000 UT) tablet Take 1,000 Units by mouth daily.  ? clobetasol (TEMOVATE) 0.05 % external solution APPLY TO SCALP 2 TIMES DAILY FOR 14 DAYS  ? desonide (DESOWEN) 0.05 % ointment Apply topically 2 (two) times daily.  ? Eszopiclone 3 MG TABS TAKE 1 TABLET BY MOUTH immediately BEFORE bedtime AS NEEDED  ? fluticasone (FLONASE) 50 MCG/ACT nasal spray Place 2 sprays into both nostrils daily.  ? irbesartan (AVAPRO) 300 MG tablet Take 1 tablet (300 mg total) by mouth daily. Please keep upcoming yearly appointment in March 2023 for future refills. Thank you  ? levothyroxine (SYNTHROID) 137 MCG tablet TAKE 1 TABLET BY MOUTH EVERY DAY BEFORE BREAKFAST  ? metFORMIN (GLUCOPHAGE-XR) 500 MG 24 hr tablet TAKE 1 TABLET BY MOUTH EVERY DAY WITH BREAKFAST  ? metoprolol succinate (TOPROL-XL) 50 MG 24 hr tablet Take 1 tablet (50 mg total) by mouth daily. Take with or immediately following a meal.  ? oseltamivir (TAMIFLU) 75 MG capsule Take 1 capsule (75  mg total) by mouth 2 (two) times daily.  ? pantoprazole (PROTONIX) 40 MG tablet TAKE 1 TABLET BY MOUTH 2 TIMES DAILY. need appointment FOR refills  ? Semaglutide (RYBELSUS) 7 MG TABS Take 7 mg by mouth daily.  ? ?No facility-administered encounter medications on file as of 09/06/2021.  ? ?Recent Relevant Labs: ?Lab Results  ?Component Value Date/Time  ? HGBA1C 6.8 (H) 06/08/2021 09:34 AM  ? HGBA1C 8.8 (H) 01/26/2021 08:48 AM  ? MICROALBUR 11.4 (H) 01/26/2021 08:48 AM  ? MICROALBUR 3.6 (H) 02/12/2019 10:06 AM  ?  ?Kidney Function ?Lab Results  ?Component Value Date/Time  ? CREATININE 1.20 01/26/2021 08:48 AM  ? CREATININE 1.07 07/20/2020 08:47 AM  ? CREATININE 1.17 04/23/2020 09:14 AM  ? CREATININE 1.11 01/19/2020 09:53 AM  ? GFR 62.00 01/26/2021 08:48 AM  ? GFRNONAA 58 (L) 07/05/2017 10:24 AM  ? GFRAA 67 07/05/2017 10:24 AM  ? ? ?Current antihyperglycemic regimen:  ?Current antihyperglycemic regimen:  ?Semaglutide (Rybelsus) - 7 mg  ?Metformin (Glucophage) - 500 mg ?  ?Adherence Review: ?Is the patient currently on a STATIN medication? Yes ?Is the patient currently on ACE/ARB medication? No ?Does the patient have >5 day gap between last estimated fill dates? No ? ?Patient contact information in chart not accurate unable to reach ? ? ?Care Gaps: ?BP- 118/80 ( 03/09/21) ?Foot Exam - Overdue ?COVID Booster - Overdue ?CCM -Unable to reach patient to schedule ? ?Star Rating Drugs: ?Semaglutide (Rybelsus) - 7 mg Last filled 08/25/21 30 DS at [facility] ?Metformin (Glucophage) - 500 mg Last  filled 08/15/21 30 DS at [facility] ?Irbesartan (Avapro) - 300 mg Last filled 07/20/21 90 DS at [facility] ? ? ?Ned Clines CMA ?Clinical Pharmacist Assistant ?[phone] ? ?

## 2021-09-17 ENCOUNTER — Other Ambulatory Visit: Payer: Self-pay | Admitting: Gastroenterology

## 2021-09-21 ENCOUNTER — Ambulatory Visit: Payer: PPO | Admitting: Cardiovascular Disease

## 2021-09-21 ENCOUNTER — Encounter: Payer: Self-pay | Admitting: Cardiovascular Disease

## 2021-09-21 ENCOUNTER — Other Ambulatory Visit: Payer: Self-pay

## 2021-09-21 VITALS — BP 124/90 | HR 76 | Ht 71.0 in | Wt 199.2 lb

## 2021-09-21 DIAGNOSIS — I10 Essential (primary) hypertension: Secondary | ICD-10-CM | POA: Diagnosis not present

## 2021-09-21 DIAGNOSIS — E782 Mixed hyperlipidemia: Secondary | ICD-10-CM | POA: Diagnosis not present

## 2021-09-21 DIAGNOSIS — E119 Type 2 diabetes mellitus without complications: Secondary | ICD-10-CM

## 2021-09-21 DIAGNOSIS — I2510 Atherosclerotic heart disease of native coronary artery without angina pectoris: Secondary | ICD-10-CM

## 2021-09-21 MED ORDER — ROSUVASTATIN CALCIUM 10 MG PO TABS: 10.0000 mg | ORAL_TABLET | Freq: Every evening | ORAL | 3 refills | Status: DC

## 2021-09-21 NOTE — Progress Notes (Signed)
?Cardiology Office Note:   ? ?Date:  09/21/2021  ? ?ID:  Jeff Lopez, DOB [birth year], MRN [id number] ? ?PCP:  Caren Macadam, MD ?  ?[facility] Providers ?Cardiologist:  Sherren Mocha, MD    ? ?Referring MD: Caren Macadam, MD  ? ?Chief Complaint  ?Patient presents with  ? Coronary Artery Disease  ? ? ?History of Present Illness:   ? ?Jeff Lopez is a 71 y.o. male with a hx of nonobstructive coronary artery disease, hypertension, COPD, diabetes, HTN, prior smoking (quit 2009)  and hyperlipidemia presents for follow up.  ? ?The patient is here alone today.  He has had more problems with arthritis over the past year.  He is not as active as he once was.  He notes that his weight has increased.  He complains of exertional dyspnea but feels that this may be related to his weight gain and increased abdominal girth.  No orthopnea or PND.  No chest pain or pressure.  He does have fatigue.  No palpitations, lightheadedness, or syncope. ? ?Past Medical History:  ?Diagnosis Date  ? Allergy   ? per pt  ? CAD, NATIVE VESSEL 04/19/2010  ? nonobstructive by cath 2007:  oLAD 20-30%, mLAD 50%, pCFX 20-30%, oAVCFX 20-30%, L renal art 50%;  normal LVF  ? Cataract   ? bil cataracts removed  ? COPD (chronic obstructive pulmonary disease) ([place])   ? Diabetes mellitus without complication ([place])   ? GERD 04/09/2007  ? Headache(784.0)   ? HYPERLIPIDEMIA 04/09/2007  ? Patient denies.  ? HYPERTENSION, BENIGN 04/19/2010  ? Hypothyroidism   ? Hypothyroidism   ? previous hyperthyroidism, s/p I-131  ? LUMBAR DISC DISORDER 05/27/2010  ? Nerve damage   ? Left leg  ? OSTEOARTHRITIS, LUMBAR SPINE 04/09/2007  ? RENAL CYST 05/27/2010  ? Spinal headache   ? After having back surgery in 2014  ? ? ?Past Surgical History:  ?Procedure Laterality Date  ? BACK SURGERY  2012,2014  ? CARDIAC CATHETERIZATION  2007  ? Dr Loanne Drilling  ? CHOLECYSTECTOMY    ? CHOLECYSTECTOMY N/A 07/13/2013  ? Procedure: LAPAROSCOPIC CHOLECYSTECTOMY WITH  INTRAOPERATIVE CHOLANGIOGRAM;  Surgeon: Shann Medal, MD;  Location: [facility];  Service: General;  Laterality: N/A;  ? COLONOSCOPY  2018  ? COLONOSCOPY W/ POLYPECTOMY    ? ERCP N/A 07/14/2013  ? Procedure: ENDOSCOPIC RETROGRADE CHOLANGIOPANCREATOGRAPHY (ERCP);  Surgeon: [name], MD;  Location: [facility];  Service: Endoscopy;  Laterality: N/A;  ? EYE SURGERY Bilateral   ? Cataract removal  ? FOOT FRACTURE SURGERY    ? FRACTURE SURGERY    ? [place] SURGERY  08/06/2012  ? L3 & L4  ? LUMBAR DISC SURGERY  11/11/2015  ? L1 L2    DR NITKA  ? LUMBAR FUSION N/A 04/20/2015  ? Procedure: T12 to L1 fusion (Extension of Previous Fusion L2-S1 to T12-S1), Right Transforaminal lumbar interbody fusion, Posterior Fusion T12 to L1, with Pedicle screws, allograft, local bone graft, and  Vivigen;  Surgeon: [name], MD;  Location: [facility];  Service: Orthopedics;  Laterality: N/A;  ? LUMBAR LAMINECTOMY N/A 11/10/2013  ? Procedure: Left L1-2 far lateral approach to excise herniated nucleus pulposus;  Surgeon: [name], MD;  Location: [facility];  Service: Orthopedics;  Laterality: N/A;  ? LUMBAR LAMINECTOMY/DECOMPRESSION MICRODISCECTOMY Left 08/10/2012  ? Procedure: Dura Repair Left Side L2-L3;  Surgeon: [name], MD;  Location: [facility];  Service: Orthopedics;  Laterality: Left;  Wilson Frame, Sliding table, dura repair kit, microscope  ? NECK SURGERY    ? X 2  ? SINOSCOPY    ? [place] SURGERY  2006  ? C-spine surgery x 2  ? UPPER GASTROINTESTINAL ENDOSCOPY    ? ? ?Current Medications: ?Current Meds  ?Medication Sig  ? amLODipine (NORVASC) 5 MG tablet Take 1 tablet (5 mg total) by mouth daily.  ? cholecalciferol (VITAMIN D3) 25 MCG (1000 UT) tablet Take 1,000 Units by mouth daily.  ? fluticasone (FLONASE) 50 MCG/ACT nasal spray Place 2 sprays into both nostrils daily.  ? irbesartan (AVAPRO) 300 MG tablet Take 1 tablet (300 mg total) by mouth daily. Please keep upcoming yearly appointment in March 2023 for future refills.  Thank you  ? levothyroxine (SYNTHROID) 137 MCG tablet TAKE 1 TABLET BY MOUTH EVERY DAY BEFORE BREAKFAST  ? metFORMIN (GLUCOPHAGE-XR) 500 MG 24 hr tablet TAKE 1 TABLET BY MOUTH EVERY DAY WITH BREAKFAST  ? metoprolol succinate (TOPROL-XL) 50 MG 24 hr tablet Take 1 tablet (50 mg total) by mouth daily. Take with or immediately following a meal.  ? pantoprazole (PROTONIX) 40 MG tablet TAKE 1 TABLET BY MOUTH 2 TIMES DAILY (Need appt FOR refills)  ? rosuvastatin (CRESTOR) 10 MG tablet Take 1 tablet (10 mg total) by mouth at bedtime.  ? Semaglutide (RYBELSUS) 7 MG TABS Take 7 mg by mouth daily.  ?  ? ?Allergies:   Ceftin, Cefuroxime axetil, and Cephalexin  ? ?Social History  ? ?Socioeconomic History  ? Marital status: Single  ?  Spouse name: Not on file  ? Number of children: 1  ? Years of education: Not on file  ? Highest education level: Not on file  ?Occupational History  ? Occupation: DISTRIBUTION MGR  ?  Employer: [employer]  ?Tobacco Use  ? Smoking status: Former  ?  Packs/day: 1.00  ?  Years: 37.00  ?  Pack years: 37.00  ?  Types: Cigarettes  ?  Quit date: 04/06/2008  ?  Years since quitting: 13.4  ? Smokeless tobacco: Never  ? Tobacco comments:  ?  pt has stopped smoking about 9 months now  ?Vaping Use  ? Vaping Use: Never used  ?Substance and Sexual Activity  ? Alcohol use: No  ? Drug use: No  ? Sexual activity: Not Currently  ?Other Topics Concern  ? Not on file  ?Social History Narrative  ? Works in Psychologist, educational. Retired.  ? Lives alone, has one child in [place].   ? ?Social Determinants of Health  ? ?Financial Resource Strain: Low Risk   ? Difficulty of Paying Living Expenses: Not hard at all  ?Food Insecurity: No Food Insecurity  ? Worried About Charity fundraiser in the Last Year: Never true  ? Ran Out of Food in the Last Year: Never true  ?Transportation Needs: Unknown  ? Lack of Transportation (Medical): No  ? Lack of Transportation (Non-Medical): Not on file  ?Physical Activity: Insufficiently  Active  ? Days of Exercise per Week: 3 days  ? Minutes of Exercise per Session: 40 min  ?Stress: No Stress Concern Present  ? Feeling of Stress : Not at all  ?Social Connections: Socially Isolated  ? Frequency of Communication with Friends and Family: More than three times a week  ? Frequency of Social Gatherings with Friends and Family: More than three times a week  ? Attends Religious Services: Never  ? Active Member of Clubs or Organizations: No  ? Attends Club or  Organization Meetings: Never  ? Marital Status: Divorced  ?  ? ?Family History: ?[name] family history includes Breast cancer in his mother; Heart attack (age of onset: 3) in his mother; Heart disease in his mother; Hypertension in his brother; Non-Hodgkin's lymphoma in his brother; Prostate cancer in his father; Stomach cancer in his paternal grandmother; Uterine cancer in his mother. There is no history of Thyroid disease, Colon cancer, Esophageal cancer, Rectal cancer, Pancreatic cancer, or Colon polyps. ? ?ROS:   ?Please see the history of present illness.    ?All other systems reviewed and are negative. ? ?EKGs/Labs/Other Studies Reviewed:   ? ?The following studies were reviewed today: ?Echo 09/20/2015: ?Study Conclusions  ? ?- Left ventricle: The cavity size was normal. Wall thickness was  ?  normal. Systolic function was normal. The estimated ejection  ?  fraction was in the range of 55% to 60%. Wall motion was normal;  ?  there were no regional wall motion abnormalities. Doppler  ?  parameters are consistent with abnormal left ventricular  ?  relaxation (grade 1 diastolic dysfunction).  ?- Aortic valve: There was mild regurgitation.  ?- Mitral valve: Calcified annulus.  ?- Left atrium: The atrium was mildly dilated.  ? ?Impressions:  ? ?- Normal LV systolic function; grade 1 diastolic dysfunction; mild  ?  AI; mild LAE.  ? ?Myoview Stress Test 04-11-2018: ?The left ventricular ejection fraction is mildly decreased (45-54%). ?Nuclear  stress EF: 52%. ?No T wave inversion was noted during stress. ?There was no ST segment deviation noted during stress. ?This is a low risk study. ?  ?Normal perfusion. LVEF 52% with normal wall motion. This is a low r

## 2021-09-21 NOTE — Patient Instructions (Signed)
Medication Instructions:  ?START ROSUVASTATIN (Crestor) '10mg'$  daily ?*If you need a refill on your cardiac medications before your next appointment, please call your pharmacy* ? ? ?Lab Work: ?LIPIDS, LFT in 3 months ?If you have labs (blood work) drawn today and your tests are completely normal, you will receive your results only by: ?MyChart Message (if you have MyChart) OR ?A paper copy in the mail ?If you have any lab test that is abnormal or we need to change your treatment, we will call you to review the results. ? ? ?Testing/Procedures: ?Lexiscan Myoview Stress test ?Your physician has requested that you have a lexiscan myoview. For further information please visit [email or web address]. Please follow instruction sheet, as given. ? ?Follow-Up: ?At [facility], you and your health needs are our priority.  As part of our continuing mission to provide you with exceptional heart care, we have created designated Provider Care Teams.  These Care Teams include your primary Cardiologist (physician) and Advanced Practice Providers (APPs -  Physician Assistants and Nurse Practitioners) who all work together to provide you with the care you need, when you need it. ? ?We recommend signing up for the patient portal called "MyChart".  Sign up information is provided on this After Visit Summary.  MyChart is used to connect with patients for Virtual Visits (Telemedicine).  Patients are able to view lab/test results, encounter notes, upcoming appointments, etc.  Non-urgent messages can be sent to your provider as well.   ?To learn more about what you can do with MyChart, go to [email or web address].   ? ?Your next appointment:   ?1 year(s) ? ?The format for your next appointment:   ?In Person ? ?Provider:   ?Sherren Mocha, MD   ? ?  ?

## 2021-09-26 ENCOUNTER — Telehealth (HOSPITAL_COMMUNITY): Payer: Self-pay | Admitting: *Deleted

## 2021-09-26 NOTE — Telephone Encounter (Signed)
Patient given detailed instructions per Myocardial Perfusion Study Information Sheet for the test on 09/30/2021 at 10:30. Patient notified to arrive 15 minutes early and that it is imperative to arrive on time for appointment to keep from having the test rescheduled. ? If you need to cancel or reschedule your appointment, please call the office within 24 hours of your appointment. . Patient verbalized understanding.Jeff Lopez ? ? ?

## 2021-09-28 ENCOUNTER — Other Ambulatory Visit: Payer: Self-pay | Admitting: Family Medicine

## 2021-09-30 ENCOUNTER — Ambulatory Visit (HOSPITAL_COMMUNITY): Payer: PPO | Attending: Internal Medicine

## 2021-09-30 DIAGNOSIS — I10 Essential (primary) hypertension: Secondary | ICD-10-CM | POA: Diagnosis not present

## 2021-09-30 DIAGNOSIS — E119 Type 2 diabetes mellitus without complications: Secondary | ICD-10-CM | POA: Insufficient documentation

## 2021-09-30 DIAGNOSIS — E782 Mixed hyperlipidemia: Secondary | ICD-10-CM | POA: Insufficient documentation

## 2021-09-30 DIAGNOSIS — I2510 Atherosclerotic heart disease of native coronary artery without angina pectoris: Secondary | ICD-10-CM | POA: Insufficient documentation

## 2021-09-30 LAB — MYOCARDIAL PERFUSION IMAGING
LV dias vol: 108 mL (ref 62–150)
LV sys vol: 52 mL
Nuc Stress EF: 52 %
Peak HR: 86 {beats}/min
Rest HR: 59 {beats}/min
Rest Nuclear Isotope Dose: 10.5 mCi
SDS: 2
SRS: 0
SSS: 2
ST Depression (mm): 0 mm
Stress Nuclear Isotope Dose: 32.5 mCi
TID: 1.02

## 2021-09-30 MED ORDER — TECHNETIUM TC 99M TETROFOSMIN IV KIT
10.5000 | PACK | Freq: Once | INTRAVENOUS | Status: AC | PRN
  Administered 2021-09-30: 10.5 via INTRAVENOUS
  Filled 2021-09-30: qty 11

## 2021-09-30 MED ORDER — REGADENOSON 0.4 MG/5ML IV SOLN
0.4000 mg | Freq: Once | INTRAVENOUS | Status: AC
  Administered 2021-09-30: 0.4 mg via INTRAVENOUS

## 2021-09-30 MED ORDER — TECHNETIUM TC 99M TETROFOSMIN IV KIT
32.5000 | PACK | Freq: Once | INTRAVENOUS | Status: AC | PRN
  Administered 2021-09-30: 32.5 via INTRAVENOUS
  Filled 2021-09-30: qty 33

## 2021-10-10 ENCOUNTER — Ambulatory Visit: Payer: PPO | Admitting: Specialist

## 2021-10-12 ENCOUNTER — Ambulatory Visit: Payer: Self-pay

## 2021-10-12 ENCOUNTER — Ambulatory Visit (INDEPENDENT_AMBULATORY_CARE_PROVIDER_SITE_OTHER): Payer: PPO

## 2021-10-12 ENCOUNTER — Ambulatory Visit (INDEPENDENT_AMBULATORY_CARE_PROVIDER_SITE_OTHER): Payer: PPO | Admitting: Specialist

## 2021-10-12 ENCOUNTER — Encounter: Payer: Self-pay | Admitting: Specialist

## 2021-10-12 VITALS — BP 130/81 | HR 68 | Ht 71.0 in | Wt 199.2 lb

## 2021-10-12 DIAGNOSIS — M7502 Adhesive capsulitis of left shoulder: Secondary | ICD-10-CM

## 2021-10-12 DIAGNOSIS — M25512 Pain in left shoulder: Secondary | ICD-10-CM

## 2021-10-12 DIAGNOSIS — M542 Cervicalgia: Secondary | ICD-10-CM

## 2021-10-12 DIAGNOSIS — M7552 Bursitis of left shoulder: Secondary | ICD-10-CM

## 2021-10-12 MED ORDER — METHYLPREDNISOLONE ACETATE 40 MG/ML IJ SUSP
40.0000 mg | INTRAMUSCULAR | Status: AC | PRN
  Administered 2021-10-12: 40 mg via INTRA_ARTICULAR

## 2021-10-12 MED ORDER — BUPIVACAINE HCL 0.5 % IJ SOLN
3.0000 mL | INTRAMUSCULAR | Status: AC | PRN
  Administered 2021-10-12: 3 mL via INTRA_ARTICULAR

## 2021-10-12 NOTE — Patient Instructions (Signed)
? ?Plan: Avoid overhead lifting and overhead use of the arms. ?Pillows to keep from sleeping directly on the shoulders ?Limited lifting to less than 10 lbs. ?Ice or heat for relief. ?Stretching exercise help and strengthening is helpful to build endurance.  ?Therapy is recommended and will be scheduled. Shoulder Exercises ?Ask your health care provider which exercises are safe for you. Do exercises exactly as told by your health care provider and adjust them as directed. It is normal to feel mild stretching, pulling, tightness, or discomfort as you do these exercises. Stop right away if you feel sudden pain or your pain gets worse. Do not begin these exercises until told by your health care provider. ?Stretching exercises ?External rotation and abduction ?This exercise is sometimes called corner stretch. This exercise rotates your arm outward (external rotation) and moves your arm out from your body (abduction). ?Stand in a doorway with one of your feet slightly in front of the other. This is called a staggered stance. If you cannot reach your forearms to the door frame, stand facing a corner of a room. ?Choose one of the following positions as told by your health care provider: ?Place your hands and forearms on the door frame above your head. ?Place your hands and forearms on the door frame at the height of your head. ?Place your hands on the door frame at the height of your elbows. ?Slowly move your weight onto your front foot until you feel a stretch across your chest and in the front of your shoulders. Keep your head and chest upright and keep your abdominal muscles tight. ?Hold for __________ seconds. ?To release the stretch, shift your weight to your back foot. ?Repeat __________ times. Complete this exercise __________ times a day. ?Extension, standing ?Stand and hold a broomstick, a cane, or a similar object behind your back. ?Your hands should be a little wider than shoulder width apart. ?Your palms should  face away from your back. ?Keeping your elbows straight and your shoulder muscles relaxed, move the stick away from your body until you feel a stretch in your shoulders (extension). ?Avoid shrugging your shoulders while you move the stick. Keep your shoulder blades tucked down toward the middle of your back. ?Hold for __________ seconds. ?Slowly return to the starting position. ?Repeat __________ times. Complete this exercise __________ times a day. ?Range-of-motion exercises ?Pendulum ? ?Stand near a wall or a surface that you can hold onto for balance. ?Bend at the waist and let your left / right arm hang straight down. Use your other arm to support you. Keep your back straight and do not lock your knees. ?Relax your left / right arm and shoulder muscles, and move your hips and your trunk so your left / right arm swings freely. Your arm should swing because of the motion of your body, not because you are using your arm or shoulder muscles. ?Keep moving your hips and trunk so your arm swings in the following directions, as told by your health care provider: ?Side to side. ?Forward and backward. ?In clockwise and counterclockwise circles. ?Continue each motion for __________ seconds, or for as long as told by your health care provider. ?Slowly return to the starting position. ?Repeat __________ times. Complete this exercise __________ times a day. ?Shoulder flexion, standing ? ?Stand and hold a broomstick, a cane, or a similar object. Place your hands a little more than shoulder width apart on the object. Your left / right hand should be palm up, and your  other hand should be palm down. ?Keep your elbow straight and your shoulder muscles relaxed. Push the stick up with your healthy arm to raise your left / right arm in front of your body, and then over your head until you feel a stretch in your shoulder (flexion). ?Avoid shrugging your shoulder while you raise your arm. Keep your shoulder blade tucked down toward  the middle of your back. ?Hold for __________ seconds. ?Slowly return to the starting position. ?Repeat __________ times. Complete this exercise __________ times a day. ?Shoulder abduction, standing ?Stand and hold a broomstick, a cane, or a similar object. Place your hands a little more than shoulder width apart on the object. Your left / right hand should be palm up, and your other hand should be palm down. ?Keep your elbow straight and your shoulder muscles relaxed. Push the object across your body toward your left / right side. Raise your left / right arm to the side of your body (abduction) until you feel a stretch in your shoulder. ?Do not raise your arm above shoulder height unless your health care provider tells you to do that. ?If directed, raise your arm over your head. ?Avoid shrugging your shoulder while you raise your arm. Keep your shoulder blade tucked down toward the middle of your back. ?Hold for __________ seconds. ?Slowly return to the starting position. ?Repeat __________ times. Complete this exercise __________ times a day. ?Internal rotation ? ?Place your left / right hand behind your back, palm up. ?Use your other hand to dangle an exercise band, a towel, or a similar object over your shoulder. Grasp the band with your left / right hand so you are holding on to both ends. ?Gently pull up on the band until you feel a stretch in the front of your left / right shoulder. The movement of your arm toward the center of your body is called internal rotation. ?Avoid shrugging your shoulder while you raise your arm. Keep your shoulder blade tucked down toward the middle of your back. ?Hold for __________ seconds. ?Release the stretch by letting go of the band and lowering your hands. ?Repeat __________ times. Complete this exercise __________ times a day. ?Strengthening exercises ?External rotation ? ?Sit in a stable chair without armrests. ?Secure an exercise band to a stable object at elbow height on  your left / right side. ?Place a soft object, such as a folded towel or a small pillow, between your left / right upper arm and your body to move your elbow about 4 inches (10 cm) away from your side. ?Hold the end of the exercise band so it is tight and there is no slack. ?Keeping your elbow pressed against the soft object, slowly move your forearm out, away from your abdomen (external rotation). Keep your body steady so only your forearm moves. ?Hold for __________ seconds. ?Slowly return to the starting position. ?Repeat __________ times. Complete this exercise __________ times a day. ?Shoulder abduction ? ?Sit in a stable chair without armrests, or stand up. ?Hold a __________ weight in your left / right hand, or hold an exercise band with both hands. ?Start with your arms straight down and your left / right palm facing in, toward your body. ?Slowly lift your left / right hand out to your side (abduction). Do not lift your hand above shoulder height unless your health care provider tells you that this is safe. ?Keep your arms straight. ?Avoid shrugging your shoulder while you do this movement. Keep your  shoulder blade tucked down toward the middle of your back. ?Hold for __________ seconds. ?Slowly lower your arm, and return to the starting position. ?Repeat __________ times. Complete this exercise __________ times a day. ?Shoulder extension ?Sit in a stable chair without armrests, or stand up. ?Secure an exercise band to a stable object in front of you so it is at shoulder height. ?Hold one end of the exercise band in each hand. Your palms should face each other. ?Straighten your elbows and lift your hands up to shoulder height. ?Step back, away from the secured end of the exercise band, until the band is tight and there is no slack. ?Squeeze your shoulder blades together as you pull your hands down to the sides of your thighs (extension). Stop when your hands are straight down by your sides. Do not let your  hands go behind your body. ?Hold for __________ seconds. ?Slowly return to the starting position. ?Repeat __________ times. Complete this exercise __________ times a day. ?Shoulder row ?Sit in a stable c

## 2021-10-12 NOTE — Progress Notes (Signed)
? ?Office Visit Note ?  ?Patient: Jeff Lopez           ?Date of Birth: May 09, 1951           ?MRN: [id number] ?Visit Date: 10/12/2021 ?             ?Requested by: Caren Macadam, MD ?[address] ?[place],  [place] [address] ?PCP: Caren Macadam, MD ? ? ?Assessment & Plan: ?Visit Diagnoses:  ?1. Left shoulder pain, unspecified chronicity   ?2. Cervicalgia   ?3. Bursitis of left shoulder   ?4. Adhesive capsulitis of left shoulder   ? ? ?Plan: Avoid overhead lifting and overhead use of the arms. ?Pillows to keep from sleeping directly on the shoulders ?Limited lifting to less than 10 lbs. ?Ice or heat for relief. ?Stretching exercise help and strengthening is helpful to build endurance.  ?Therapy is recommended and will be scheduled.  ? ?Follow-Up Instructions: Return in about 4 weeks (around 11/09/2021).  ? ?Orders:  ?Orders Placed This Encounter  ?Procedures  ?? XR Shoulder Left  ?? XR Cervical Spine 2 or 3 views  ? ?No orders of the defined types were placed in this encounter. ? ? ? ? Procedures: ?Large Joint Inj: L subacromial bursa on 10/12/2021 11:30 AM ?Indications: pain ?Details: 25 G 1.5 in needle, anterolateral approach ? ?Arthrogram: No ? ?Medications: 40 mg methylPREDNISolone acetate 40 MG/ML; 3 mL bupivacaine 0.5 % ?Outcome: tolerated well, no immediate complications ?Procedure, treatment alternatives, risks and benefits explained, specific risks discussed. Consent was given by the patient. Immediately prior to procedure a time out was called to verify the correct patient, procedure, equipment, support staff and site/side marked as required. Patient was prepped and draped in the usual sterile fashion.  ? ? ? ?Clinical Data: ?No additional findings. ? ? ?Subjective: ?Chief Complaint  ?Patient presents with  ?? Left Shoulder - Pain  ? ? ?71 year old male right handed with at least one month history of left shoulder pain difficulty with ROM of the left  ?shoulder. Pain with lying on the  left side. Pain started after vaccinations and has been present since then.   ? ? ?Review of Systems ? ? ?Objective: ?Vital Signs: BP 130/81 (BP Location: Left Arm, Patient Position: Sitting)   Pulse 68   Ht '5\' 11"'  (1.803 m)   Wt 199 lb 3.2 oz (90.4 kg)   BMI 27.78 kg/m?  ? ?Physical Exam ? ?Ortho Exam ? ?Specialty Comments:  ?No specialty comments available. ? ?Imaging: ?XR Shoulder Left ? ?Result Date: 10/12/2021 ?AP and lateral and outlet view with SAS at 10.63m and Minimal DJD AC joint, mild calcification of the Coracoacromion ligament.  ? ? ?PMFS History: ?Patient Active Problem List  ? Diagnosis Date Noted  ?? Spinal stenosis, lumbar region, with neurogenic claudication 04/21/2015  ?  Priority: High  ?  Class: Chronic  ?? Spondylolisthesis of lumbar region 04/21/2015  ?  Priority: High  ?  Class: Chronic  ?? Postoperative CSF leak 08/10/2012  ?  Priority: High  ?  Class: Acute  ?? HNP (herniated nucleus pulposus), lumbar 08/06/2012  ?  Priority: High  ?  Class: Acute  ?? Pruritus 08/24/2020  ?? OSA (obstructive sleep apnea) 11/25/2018  ?? Non-restorative sleep 11/25/2018  ?? Malignant ne[place] of ascending colon ([place] 05/16/2018  ?? Low testosterone 03/02/2018  ?? Gynecomastia 02/28/2018  ?? Left lower quadrant pain 12/26/2017  ?? Arthralgia 10/03/2017  ?? Numbness 10/03/2017  ?? Chronic pansinusitis 01/09/2017  ??  Deviated septum 01/09/2017  ?? Chronic left sacroiliac pain 10/19/2016  ?? Chronic left-sided low back pain with left-sided sciatica 07/31/2016  ?? Hypothyroidism following radioiodine therapy 03/10/2015  ?? Screening for prostate cancer 03/10/2015  ?? Hemochromatosis 03/27/2014  ?? Choledocholithiasis 07/14/2013  ?? Nonspecific (abnormal) findings on radiological and other examination of biliary tract 07/13/2013  ?? Calculus of gallbladder with acute cholecystitis, without mention of obstruction 07/13/2013  ?? Calculus of bile duct without mention of cholecystitis or obstruction 07/13/2013  ??  Abdominal pain 07/12/2013  ?? Neck pain on left side 05/30/2013  ?? Quit smoking 11/28/2012  ?? Degenerative disc disease, lumbar 08/06/2012  ?  Class: Chronic  ?? Type II diabetes mellitus with peripheral autonomic neuropathy ([place]) 06/10/2012  ?? COPD GOLD 0 02/09/2012  ?? Chest pain at rest 02/06/2012  ?? Mixed allergic and non-allergic rhinitis 02/06/2012  ?? Radiculopathy of leg 11/05/2010  ?? RENAL CYST 05/27/2010  ?? LUMBAR DISC DISORDER 05/27/2010  ?? HYPERTENSION, BENIGN 04/19/2010  ?? CAD, NATIVE VESSEL 04/19/2010  ?? Hyperlipidemia associated with type 2 diabetes mellitus ([place]) 04/09/2007  ?? GERD 04/09/2007  ?? OSTEOARTHRITIS, LUMBAR SPINE 04/09/2007  ? ?Past Medical History:  ?Diagnosis Date  ?? Allergy   ? per pt  ?? CAD, NATIVE VESSEL 04/19/2010  ? nonobstructive by cath 2007:  oLAD 20-30%, mLAD 50%, pCFX 20-30%, oAVCFX 20-30%, L renal art 50%;  normal LVF  ?? Cataract   ? bil cataracts removed  ?? COPD (chronic obstructive pulmonary disease) ([place])   ?? Diabetes mellitus without complication ([place])   ?? GERD 04/09/2007  ?? Headache(784.0)   ?? HYPERLIPIDEMIA 04/09/2007  ? Patient denies.  ?? HYPERTENSION, BENIGN 04/19/2010  ?? Hypothyroidism   ?? Hypothyroidism   ? previous hyperthyroidism, s/p I-131  ?? LUMBAR DISC DISORDER 05/27/2010  ?? Nerve damage   ? Left leg  ?? OSTEOARTHRITIS, LUMBAR SPINE 04/09/2007  ?? RENAL CYST 05/27/2010  ?? Spinal headache   ? After having back surgery in 2014  ?  ?Family History  ?Problem Relation Age of Onset  ?? Breast cancer Mother   ?? Uterine cancer Mother   ?? Heart disease Mother   ?? Heart attack Mother 68  ?? Prostate cancer Father   ?? Hypertension Brother   ?? Non-Hodgkin's lymphoma Brother   ?? Stomach cancer Paternal Grandmother   ?? Thyroid disease Neg Hx   ?? Colon cancer Neg Hx   ?? Esophageal cancer Neg Hx   ?? Rectal cancer Neg Hx   ?? Pancreatic cancer Neg Hx   ?? Colon polyps Neg Hx   ?  ?Past Surgical History:  ?Procedure Laterality Date  ?? BACK  SURGERY  2012,2014  ?? CARDIAC CATHETERIZATION  2007  ? Dr Loanne Drilling  ?? CHOLECYSTECTOMY    ?? CHOLECYSTECTOMY N/A 07/13/2013  ? Procedure: LAPAROSCOPIC CHOLECYSTECTOMY WITH INTRAOPERATIVE CHOLANGIOGRAM;  Surgeon: Shann Medal, MD;  Location: [facility];  Service: General;  Laterality: N/A;  ?? COLONOSCOPY  2018  ?? COLONOSCOPY W/ POLYPECTOMY    ?? ERCP N/A 07/14/2013  ? Procedure: ENDOSCOPIC RETROGRADE CHOLANGIOPANCREATOGRAPHY (ERCP);  Surgeon: [name], MD;  Location: [facility];  Service: Endoscopy;  Laterality: N/A;  ?? EYE SURGERY Bilateral   ? Cataract removal  ?? FOOT FRACTURE SURGERY    ?? FRACTURE SURGERY    ?? LUMBAR [place] SURGERY  08/06/2012  ? L3 & L4  ?? LUMBAR DISC SURGERY  11/11/2015  ? L1 L2    DR Breydan [place]  ?? LUMBAR FUSION N/A 04/20/2015  ?  Procedure: T12 to L1 fusion (Extension of Previous Fusion L2-S1 to T12-S1), Right Transforaminal lumbar interbody fusion, Posterior Fusion T12 to L1, with Pedicle screws, allograft, local bone graft, and  Vivigen;  Surgeon: [name], MD;  Location: [facility];  Service: Orthopedics;  Laterality: N/A;  ?? LUMBAR LAMINECTOMY N/A 11/10/2013  ? Procedure: Left L1-2 far lateral approach to excise herniated nucleus pulposus;  Surgeon: [name], MD;  Location: [facility];  Service: Orthopedics;  Laterality: N/A;  ?? LUMBAR LAMINECTOMY/DECOMPRESSION MICRODISCECTOMY Left 08/10/2012  ? Procedure: Dura Repair Left Side L2-L3;  Surgeon: [name], MD;  Location: [facility];  Service: Orthopedics;  Laterality: Left;  Wilson Frame, Sliding table, dura repair kit, microscope  ?? NECK SURGERY    ? X 2  ?? SINOSCOPY    ?? [place] SURGERY  2006  ? C-spine surgery x 2  ?? UPPER GASTROINTESTINAL ENDOSCOPY    ? ?Social History  ? ?Occupational History  ?? Occupation: DISTRIBUTION [employer]  ?  Employer: [employer]  ?Tobacco Use  ?? Smoking status: Former  ?  Packs/day: 1.00  ?  Years: 37.00  ?  Pack years: 37.00  ?  Types: Cigarettes  ?  Quit date: 04/06/2008  ?  Years since quitting:  13.5  ?? Smokeless tobacco: Never  ?? Tobacco comments:  ?  pt has stopped smoking about 9 months now  ?Vaping Use  ?? Vaping Use: Never used  ?Substance and Sexual Activity  ?? Alcohol use: No  ?? Drug use: No  ??

## 2021-10-20 ENCOUNTER — Ambulatory Visit: Payer: PPO | Attending: General Practice

## 2021-10-20 ENCOUNTER — Other Ambulatory Visit: Payer: Self-pay | Admitting: Cardiovascular Disease

## 2021-10-20 ENCOUNTER — Other Ambulatory Visit: Payer: Self-pay | Admitting: Gastroenterology

## 2021-10-20 DIAGNOSIS — M7502 Adhesive capsulitis of left shoulder: Secondary | ICD-10-CM | POA: Insufficient documentation

## 2021-10-20 DIAGNOSIS — M7552 Bursitis of left shoulder: Secondary | ICD-10-CM | POA: Diagnosis not present

## 2021-10-20 DIAGNOSIS — M542 Cervicalgia: Secondary | ICD-10-CM | POA: Insufficient documentation

## 2021-10-20 DIAGNOSIS — M6281 Muscle weakness (generalized): Secondary | ICD-10-CM | POA: Insufficient documentation

## 2021-10-20 DIAGNOSIS — M25512 Pain in left shoulder: Secondary | ICD-10-CM | POA: Insufficient documentation

## 2021-10-20 DIAGNOSIS — M25612 Stiffness of left shoulder, not elsewhere classified: Secondary | ICD-10-CM | POA: Insufficient documentation

## 2021-10-20 NOTE — Therapy (Signed)
?OUTPATIENT PHYSICAL THERAPY SHOULDER EVALUATION ? ? ?Patient Name: Jeff Lopez ?MRN: [id number] ?DOB:03/28/1951, 71 y.o., male ?Today's Date: 10/20/2021 ? ? PT End of Session - 10/20/21 2100   ? ? Visit Number 1   ? Date for PT Re-Evaluation 12/15/21   ? Authorization Type [employer]   ? PT Start Time 1215   ? PT Stop Time 1310   ? PT Time Calculation (min) 55 min   ? Activity Tolerance Patient tolerated treatment well   ? Behavior During Therapy [facility] for tasks assessed/performed   ? ?  ?  ? ?  ? ? ?Past Medical History:  ?Diagnosis Date  ? Allergy   ? per pt  ? CAD, NATIVE VESSEL 04/19/2010  ? nonobstructive by cath 2007:  oLAD 20-30%, mLAD 50%, pCFX 20-30%, oAVCFX 20-30%, L renal art 50%;  normal LVF  ? Cataract   ? bil cataracts removed  ? COPD (chronic obstructive pulmonary disease) ([place])   ? Diabetes mellitus without complication ([place])   ? GERD 04/09/2007  ? Headache(784.0)   ? HYPERLIPIDEMIA 04/09/2007  ? Patient denies.  ? HYPERTENSION, BENIGN 04/19/2010  ? Hypothyroidism   ? Hypothyroidism   ? previous hyperthyroidism, s/p I-131  ? LUMBAR DISC DISORDER 05/27/2010  ? Nerve damage   ? Left leg  ? OSTEOARTHRITIS, LUMBAR SPINE 04/09/2007  ? RENAL CYST 05/27/2010  ? Spinal headache   ? After having back surgery in 2014  ? ?Past Surgical History:  ?Procedure Laterality Date  ? BACK SURGERY  2012,2014  ? CARDIAC CATHETERIZATION  2007  ? Dr Loanne Drilling  ? CHOLECYSTECTOMY    ? CHOLECYSTECTOMY N/A 07/13/2013  ? Procedure: LAPAROSCOPIC CHOLECYSTECTOMY WITH INTRAOPERATIVE CHOLANGIOGRAM;  Surgeon: Shann Medal, MD;  Location: [facility];  Service: General;  Laterality: N/A;  ? COLONOSCOPY  2018  ? COLONOSCOPY W/ POLYPECTOMY    ? ERCP N/A 07/14/2013  ? Procedure: ENDOSCOPIC RETROGRADE CHOLANGIOPANCREATOGRAPHY (ERCP);  Surgeon: [name], MD;  Location: [facility];  Service: Endoscopy;  Laterality: N/A;  ? EYE SURGERY Bilateral   ? Cataract removal  ? FOOT FRACTURE SURGERY    ? FRACTURE SURGERY    ? [place]  SURGERY  08/06/2012  ? L3 & L4  ? LUMBAR DISC SURGERY  11/11/2015  ? L1 L2    DR NITKA  ? LUMBAR FUSION N/A 04/20/2015  ? Procedure: T12 to L1 fusion (Extension of Previous Fusion L2-S1 to T12-S1), Right Transforaminal lumbar interbody fusion, Posterior Fusion T12 to L1, with Pedicle screws, allograft, local bone graft, and  Vivigen;  Surgeon: [name], MD;  Location: [facility];  Service: Orthopedics;  Laterality: N/A;  ? LUMBAR LAMINECTOMY N/A 11/10/2013  ? Procedure: Left L1-2 far lateral approach to excise herniated nucleus pulposus;  Surgeon: [name], MD;  Location: [facility];  Service: Orthopedics;  Laterality: N/A;  ? LUMBAR LAMINECTOMY/DECOMPRESSION MICRODISCECTOMY Left 08/10/2012  ? Procedure: Dura Repair Left Side L2-L3;  Surgeon: [name], MD;  Location: [facility];  Service: Orthopedics;  Laterality: Left;  Wilson Frame, Sliding table, dura repair kit, microscope  ? NECK SURGERY    ? X 2  ? SINOSCOPY    ? [place] SURGERY  2006  ? C-spine surgery x 2  ? UPPER GASTROINTESTINAL ENDOSCOPY    ? ?Patient Active Problem List  ? Diagnosis Date Noted  ? Pruritus 08/24/2020  ? OSA (obstructive sleep apnea) 11/25/2018  ? Non-restorative sleep 11/25/2018  ? Malignant neoplasm of ascending colon ([place]) 05/16/2018  ?  Low testosterone 03/02/2018  ? Gynecomastia 02/28/2018  ? Left lower quadrant pain 12/26/2017  ? Arthralgia 10/03/2017  ? Numbness 10/03/2017  ? Chronic pansinusitis 01/09/2017  ? Deviated septum 01/09/2017  ? Chronic left sacroiliac pain 10/19/2016  ? Chronic left-sided low back pain with left-sided sciatica 07/31/2016  ? Spinal stenosis, lumbar region, with neurogenic claudication 04/21/2015  ?  Class: Chronic  ? Spondylolisthesis of lumbar region 04/21/2015  ?  Class: Chronic  ? Hypothyroidism following radioiodine therapy 03/10/2015  ? Screening for prostate cancer 03/10/2015  ? Hemochromatosis 03/27/2014  ? Choledocholithiasis 07/14/2013  ? Nonspecific (abnormal) findings on radiological and other  examination of biliary tract 07/13/2013  ? Calculus of gallbladder with acute cholecystitis, without mention of obstruction 07/13/2013  ? Calculus of bile duct without mention of cholecystitis or obstruction 07/13/2013  ? Abdominal pain 07/12/2013  ? Neck pain on left side 05/30/2013  ? Quit smoking 11/28/2012  ? Postoperative CSF leak 08/10/2012  ?  Class: Acute  ? Degenerative disc disease, lumbar 08/06/2012  ?  Class: Chronic  ? HNP (herniated nucleus pulposus), lumbar 08/06/2012  ?  Class: Acute  ? Type II diabetes mellitus with peripheral autonomic neuropathy ([place]) 06/10/2012  ? COPD GOLD 0 02/09/2012  ? Chest pain at rest 02/06/2012  ? Mixed allergic and non-allergic rhinitis 02/06/2012  ? Radiculopathy of leg 11/05/2010  ? RENAL CYST 05/27/2010  ? LUMBAR DISC DISORDER 05/27/2010  ? HYPERTENSION, BENIGN 04/19/2010  ? CAD, NATIVE VESSEL 04/19/2010  ? Hyperlipidemia associated with type 2 diabetes mellitus ([place]) 04/09/2007  ? GERD 04/09/2007  ? OSTEOARTHRITIS, LUMBAR SPINE 04/09/2007  ? ? ?PCP: Caren Macadam, MD ? ?REFERRING PROVIDER: [name], MD ? ?REFERRING DIAG:  ?M25.512 (ICD-10-CM) - Left shoulder pain, unspecified chronicity  ?M54.2 (ICD-10-CM) - Cervicalgia  ?M75.52 (ICD-10-CM) - Bursitis of left shoulder  ?M75.02 (ICD-10-CM) - Adhesive capsulitis of left shoulder  ? ? ?THERAPY DIAG:  ?Acute pain of left shoulder - Plan: PT plan of care cert/re-cert ? ?Stiffness of left shoulder, not elsewhere classified - Plan: PT plan of care cert/re-cert ? ?Muscle weakness (generalized) - Plan: PT plan of care cert/re-cert ? ? ?ONSET DATE: 10/12/2021 ? ?SUBJECTIVE:                                                                                                                                                                                     ? ?SUBJECTIVE STATEMENT: ?Patient explains that he has a long history of orthopedic issues including several back and neck surgeries.  He has also had some pain in his  right shoulder but this seems to be at [place].  He has reservations about  therapy because it has never really helped in the past.  He has pain with any motion above shoulder level and with reaching back.  He hopes to have some immediate relief with the therapy.   ? ?PERTINENT HISTORY: ?See above ? ?PAIN:  ?Are you having pain? Yes: NPRS scale: 0/10 ?Pain location: left shoulder (only hurts when I'm using it overhead) ?Pain description: sharp aching ?Aggravating factors: overhead reach ?Relieving factors: rest ? ?PRECAUTIONS: None ? ?WEIGHT BEARING RESTRICTIONS No ? ?FALLS:  ?Has patient fallen in last 6 months? No ? ?LIVING ENVIRONMENT: ?Lives with: lives with their family ?Lives in: House/apartment ? ?OCCUPATION: ?Retired ? ?PLOF: Independent ? ?PATIENT GOALS To get immediate relief from pain ? ?OBJECTIVE:  ? ?DIAGNOSTIC FINDINGS:  ?AP and lateral and outlet view with SAS at 10.29m and Minimal DJD AC  ?joint, mild calcification of the  ?Coracoacromion ligament. ? ?PATIENT SURVEYS:  ?FOTO will need to complete next visit ? ?COGNITION: ? Overall cognitive status: Within functional limits for tasks assessed ?    ?SENSATION: ?WFL ? ?POSTURE: ?Rounded shoulders ? ?UPPER EXTREMITY ROM:  ? ?Generally WFL with impingement at 80-90 degrees of flexion and abduction ?UPPER EXTREMITY MMT: ?  Generally 4+/5 with exception of left shoulder ER and scaption with IR ? ?SHOULDER SPECIAL TESTS: ? Impingement tests: Hawkins/Kennedy impingement test: positive  and Painful arc test: positive  ? Instability tests: Apprehension test: negative ? Rotator cuff assessment: Empty can test: negative ? Biceps assessment: Speed's test: negative ? ?PALPATION:  ?Tender to palpation at supraspinatus and superior shoulder area. ?  ?TODAY'S TREATMENT:  ?Initial evaluation completed and initiated HEP. ? ? ?PATIENT EDUCATION: ?Education details: Initiated HEP ?Person educated: Patient ?Education method: Explanation, Demonstration, Verbal cues, and  Handouts ?Education comprehension: verbalized understanding, returned demonstration, and verbal cues required ? ? ?HOME EXERCISE PROGRAM: ?Access Code: [email or web address] ?URL: [email or web address] ?Date: 04

## 2021-10-21 ENCOUNTER — Other Ambulatory Visit (HOSPITAL_BASED_OUTPATIENT_CLINIC_OR_DEPARTMENT_OTHER): Payer: Self-pay | Admitting: Cardiovascular Disease

## 2021-11-11 ENCOUNTER — Ambulatory Visit (INDEPENDENT_AMBULATORY_CARE_PROVIDER_SITE_OTHER): Payer: PPO | Admitting: Specialist

## 2021-11-11 ENCOUNTER — Encounter: Payer: Self-pay | Admitting: Specialist

## 2021-11-11 VITALS — BP 141/89 | HR 65 | Ht 71.0 in | Wt 199.0 lb

## 2021-11-11 DIAGNOSIS — M25512 Pain in left shoulder: Secondary | ICD-10-CM

## 2021-11-11 DIAGNOSIS — M7582 Other shoulder lesions, left shoulder: Secondary | ICD-10-CM

## 2021-11-11 DIAGNOSIS — M7552 Bursitis of left shoulder: Secondary | ICD-10-CM | POA: Diagnosis not present

## 2021-11-11 DIAGNOSIS — M7542 Impingement syndrome of left shoulder: Secondary | ICD-10-CM | POA: Diagnosis not present

## 2021-11-11 NOTE — Progress Notes (Signed)
Office Visit Note   Patient: Jeff Lopez           Date of Birth: October 18, 1950           MRN: [id number] Visit Date: 11/11/2021              Requested by: Caren Macadam, MD [place],  [place] [id number] PCP: Caren Macadam, MD   Assessment & Plan: Visit Diagnoses:  1. Bursitis of left shoulder   2. Left shoulder pain, unspecified chronicity   3. Impingement syndrome of left shoulder     Plan: Avoid overhead lifting and overhead use of the arms. Pillows to keep from sleeping directly on the shoulders Limited lifting to less than 10 lbs. Ice or heat for relief. NSAIDs are helpful, such as alleve or motrin, be careful not to use in excess as they place burdens on the kidney. Stretching exercise help and strengthening is helpful to build endurance.   Follow-Up Instructions: No follow-ups on file.   Orders:  No orders of the defined types were placed in this encounter.  No orders of the defined types were placed in this encounter.     Procedures: No procedures performed   Clinical Data: No additional findings.   Subjective: Chief Complaint  Patient presents with   Neck - Pain   Left Shoulder - Pain    71 year old right handed male seen and had left shoulder pain and weakness. Pain with lying on the left side and with movement like reaching behind him. No numbness or paresthesias. Had cortisone injection with only 2-3 days relief. Using ice at home and it is temporarily helps. Saw PT and was taught exercises.  Review of Systems  Constitutional: Negative.   HENT: Negative.    Eyes: Negative.   Respiratory: Negative.    Cardiovascular: Negative.   Gastrointestinal: Negative.   Endocrine: Negative.   Genitourinary: Negative.   Musculoskeletal: Negative.   Skin: Negative.   Allergic/Immunologic: Negative.   Neurological: Negative.   Hematological: Negative.   Psychiatric/Behavioral: Negative.      Objective: Vital Signs: BP  (!) 141/89 (BP Location: Left Arm, Patient Position: Sitting)   Pulse 65   Ht _0  (1.803 m)   Wt 199 lb (90.3 kg)   BMI 27.75 kg/m   Physical Exam Constitutional:      Appearance: He is well-developed.  HENT:     Head: Normocephalic and atraumatic.  Eyes:     Pupils: Pupils are equal, round, and reactive to light.  Pulmonary:     Effort: Pulmonary effort is normal.     Breath sounds: Normal breath sounds.  Abdominal:     General: Bowel sounds are normal.     Palpations: Abdomen is soft.  Musculoskeletal:     Cervical back: Normal range of motion and neck supple.  Skin:    General: Skin is warm and dry.  Neurological:     Mental Status: He is alert and oriented to person, place, and time.  Psychiatric:        Behavior: Behavior normal.        Thought Content: Thought content normal.        Judgment: Judgment normal.   Right Shoulder Exam   Range of Motion  Active abduction:  normal  Passive abduction:  normal  Extension:  normal  External rotation:  normal  Forward flexion:  normal  Internal rotation 0 degrees:  normal  Internal rotation 90 degrees:  normal   Muscle Strength  Abduction: 4/5  Internal rotation: 4/5  External rotation: 4/5  Supraspinatus: 4/5    Left Shoulder Exam   Tenderness  The patient is experiencing tenderness in the acromion.  Range of Motion  Active abduction:  abnormal  Passive abduction:  abnormal  Extension:  abnormal  External rotation:  abnormal  Forward flexion:  abnormal  Internal rotation 0 degrees:  abnormal  Internal rotation 90 degrees:  abnormal   Muscle Strength  Abduction: 4/5  Internal rotation: 4/5  Supraspinatus: 4/5   Tests  Apprehension: positive Impingement: positive  Other  Erythema: absent Scars: absent Sensation: normal     Specialty Comments:  No specialty comments available.  Imaging: No results found.   PMFS History: Patient Active Problem List   Diagnosis Date Noted   Spinal  stenosis, lumbar region, with neurogenic claudication 04/21/2015    Priority: High    Class: Chronic   Spondylolisthesis of lumbar region 04/21/2015    Priority: High    Class: Chronic   Postoperative CSF leak 08/10/2012    Priority: High    Class: Acute   HNP (herniated nucleus pulposus), lumbar 08/06/2012    Priority: High    Class: Acute   Pruritus 08/24/2020   OSA (obstructive sleep apnea) 11/25/2018   Non-restorative sleep 11/25/2018   Malignant neoplasm of ascending colon ([place]) 05/16/2018   Low testosterone 03/02/2018   Gynecomastia 02/28/2018   Left lower quadrant pain 12/26/2017   Arthralgia 10/03/2017   Numbness 10/03/2017   Chronic pansinusitis 01/09/2017   Deviated septum 01/09/2017   Chronic left sacroiliac pain 10/19/2016   Chronic left-sided low back pain with left-sided sciatica 07/31/2016   Hypothyroidism following radioiodine therapy 03/10/2015   Screening for prostate cancer 03/10/2015   Hemochromatosis 03/27/2014   Choledocholithiasis 07/14/2013   Nonspecific (abnormal) findings on radiological and other examination of biliary tract 07/13/2013   Calculus of gallbladder with acute cholecystitis, without mention of obstruction 07/13/2013   Calculus of bile duct without mention of cholecystitis or obstruction 07/13/2013   Abdominal pain 07/12/2013   Neck pain on left side 05/30/2013   Quit smoking 11/28/2012   Degenerative disc disease, lumbar 08/06/2012    Class: Chronic   Type II diabetes mellitus with peripheral autonomic neuropathy ([place]) 06/10/2012   COPD GOLD 0 02/09/2012   Chest pain at rest 02/06/2012   Mixed allergic and non-allergic rhinitis 02/06/2012   Radiculopathy of leg 11/05/2010   RENAL CYST 05/27/2010   LUMBAR [place] DISORDER 05/27/2010   HYPERTENSION, BENIGN 04/19/2010   CAD, NATIVE VESSEL 04/19/2010   Hyperlipidemia associated with type 2 diabetes mellitus ([place]) 04/09/2007   GERD 04/09/2007   OSTEOARTHRITIS, LUMBAR SPINE 04/09/2007    Past Medical History:  Diagnosis Date   Allergy    per pt   CAD, NATIVE VESSEL 04/19/2010   nonobstructive by cath 2007:  oLAD 20-30%, mLAD 50%, pCFX 20-30%, oAVCFX 20-30%, L renal art 50%;  normal LVF   Cataract    bil cataracts removed   COPD (chronic obstructive pulmonary disease) ([place])    Diabetes mellitus without complication ([place])    GERD 04/09/2007   Headache(784.0)    HYPERLIPIDEMIA 04/09/2007   Patient denies.   HYPERTENSION, BENIGN 04/19/2010   Hypothyroidism    Hypothyroidism    previous hyperthyroidism, s/p I-131   LUMBAR DISC DISORDER 05/27/2010   Nerve damage    Left leg   OSTEOARTHRITIS, LUMBAR SPINE 04/09/2007   RENAL CYST 05/27/2010   Spinal headache  After having back surgery in 2014    Family History  Problem Relation Age of Onset   Breast cancer Mother    Uterine cancer Mother    Heart disease Mother    Heart attack Mother 29   Prostate cancer Father    Hypertension Brother    Non-Hodgkin's lymphoma Brother    Stomach cancer Paternal Grandmother    Thyroid disease Neg Hx    Colon cancer Neg Hx    Esophageal cancer Neg Hx    Rectal cancer Neg Hx    Pancreatic cancer Neg Hx    Colon polyps Neg Hx     Past Surgical History:  Procedure Laterality Date   BACK SURGERY  2012,2014   CARDIAC CATHETERIZATION  2007   Dr Loanne Drilling   CHOLECYSTECTOMY     CHOLECYSTECTOMY N/A 07/13/2013   Procedure: LAPAROSCOPIC CHOLECYSTECTOMY WITH INTRAOPERATIVE CHOLANGIOGRAM;  Surgeon: Shann Medal, MD;  Location: [facility];  Service: General;  Laterality: N/A;   COLONOSCOPY  2018   COLONOSCOPY W/ POLYPECTOMY     ERCP N/A 07/14/2013   Procedure: ENDOSCOPIC RETROGRADE CHOLANGIOPANCREATOGRAPHY (ERCP);  Surgeon: [name], MD;  Location: [facility];  Service: Endoscopy;  Laterality: N/A;   EYE SURGERY Bilateral    Cataract removal   FOOT FRACTURE SURGERY     FRACTURE SURGERY     LUMBAR [place] SURGERY  08/06/2012   L3 & L4   LUMBAR DISC SURGERY  11/11/2015   L1 L2     DR Niyana Chesbro   LUMBAR FUSION N/A 04/20/2015   Procedure: T12 to L1 fusion (Extension of Previous Fusion L2-S1 to T12-S1), Right Transforaminal lumbar interbody fusion, Posterior Fusion T12 to L1, with Pedicle screws, allograft, local bone graft, and  Vivigen;  Surgeon: [name], MD;  Location: [facility];  Service: Orthopedics;  Laterality: N/A;   LUMBAR LAMINECTOMY N/A 11/10/2013   Procedure: Left L1-2 far lateral approach to excise herniated nucleus pulposus;  Surgeon: [name], MD;  Location: [facility];  Service: Orthopedics;  Laterality: N/A;   LUMBAR LAMINECTOMY/DECOMPRESSION MICRODISCECTOMY Left 08/10/2012   Procedure: Dura Repair Left Side L2-L3;  Surgeon: [name], MD;  Location: [facility];  Service: Orthopedics;  Laterality: Left;  Wilson Frame, Sliding table, dura repair kit, microscope   NECK SURGERY     X 2   SINOSCOPY     SPINE SURGERY  2006   C-spine surgery x 2   UPPER GASTROINTESTINAL ENDOSCOPY     Social History   Occupational History   Occupation: DISTRIBUTION [employer]    Employer: [employer]  Tobacco Use   Smoking status: Former    Packs/day: 1.00    Years: 37.00    Pack years: 37.00    Types: Cigarettes    Quit date: 04/06/2008    Years since quitting: 13.6   Smokeless tobacco: Never   Tobacco comments:    pt has stopped smoking about 9 months now  Vaping Use   Vaping Use: Never used  Substance and Sexual Activity   Alcohol use: No   Drug use: No   Sexual activity: Not Currently

## 2021-11-11 NOTE — Patient Instructions (Signed)
Plan: Avoid overhead lifting and overhead use of the arms. Pillows to keep from sleeping directly on the shoulders Limited lifting to less than 10 lbs. Ice or heat for relief. NSAIDs are helpful, such as alleve or motrin, be careful not to use in excess as they place burdens on the kidney. Stretching exercise help and strengthening is helpful to build endurance.

## 2021-11-15 ENCOUNTER — Telehealth: Payer: Self-pay | Admitting: Specialist

## 2021-11-15 NOTE — Telephone Encounter (Signed)
I called and there was no answer, He DOES need to see Dr. Marlou Sa to review the MRI of his shoulder. Thei appt needs to be made if he calls back,

## 2021-11-15 NOTE — Telephone Encounter (Signed)
Pt called asking for a call back. Pt states Dr. Louanne Skye stated to him he wanted him to see Dr Marlou Sa. Pt has an upcoming MRI 5/26 and unsure if he wants to see Dr Marlou Sa for MRI review. Pt don't want to wait until June 23 for his results. Please call pt about this matter at [phone].

## 2021-11-16 ENCOUNTER — Other Ambulatory Visit: Payer: Self-pay | Admitting: Family Medicine

## 2021-11-16 ENCOUNTER — Other Ambulatory Visit: Payer: Self-pay | Admitting: Gastroenterology

## 2021-11-16 DIAGNOSIS — E890 Postprocedural hypothyroidism: Secondary | ICD-10-CM

## 2021-11-16 NOTE — Telephone Encounter (Signed)
I called and scheduled him with Dr. Marlou Sa 12/02/21 @ 845am

## 2021-11-18 ENCOUNTER — Ambulatory Visit (HOSPITAL_BASED_OUTPATIENT_CLINIC_OR_DEPARTMENT_OTHER)
Admission: RE | Admit: 2021-11-18 | Discharge: 2021-11-18 | Disposition: A | Discharge status: Home / Self Care | Payer: PPO | Source: Ambulatory Visit | Attending: Specialist | Admitting: Specialist

## 2021-11-18 DIAGNOSIS — M7582 Other shoulder lesions, left shoulder: Secondary | ICD-10-CM

## 2021-11-18 DIAGNOSIS — M25512 Pain in left shoulder: Secondary | ICD-10-CM

## 2021-11-18 DIAGNOSIS — S46011A Strain of muscle(s) and tendon(s) of the rotator cuff of right shoulder, initial encounter: Secondary | ICD-10-CM | POA: Diagnosis not present

## 2021-11-18 DIAGNOSIS — M7542 Impingement syndrome of left shoulder: Secondary | ICD-10-CM

## 2021-11-18 DIAGNOSIS — M7552 Bursitis of left shoulder: Secondary | ICD-10-CM | POA: Diagnosis not present

## 2021-11-30 ENCOUNTER — Telehealth: Payer: Self-pay | Admitting: Acute Care

## 2021-11-30 DIAGNOSIS — Z87891 Personal history of nicotine dependence: Secondary | ICD-10-CM

## 2021-11-30 DIAGNOSIS — Z122 Encounter for screening for malignant neoplasm of respiratory organs: Secondary | ICD-10-CM

## 2021-11-30 NOTE — Telephone Encounter (Signed)
Patient left message with front office staff that he wanted to schedule annual LDCT.  Called patient but no answer and phone did not accept VM.  Will place new order and retry calling

## 2021-11-30 NOTE — Telephone Encounter (Signed)
Attempted to call again x2 to schedule LDCT.  No answer and no VM picks up.

## 2021-12-02 ENCOUNTER — Ambulatory Visit: Payer: PPO | Admitting: Orthopedic Surgery

## 2021-12-02 DIAGNOSIS — M7552 Bursitis of left shoulder: Secondary | ICD-10-CM

## 2021-12-05 DIAGNOSIS — L578 Other skin changes due to chronic exposure to nonionizing radiation: Secondary | ICD-10-CM | POA: Diagnosis not present

## 2021-12-05 DIAGNOSIS — L814 Other melanin hyperpigmentation: Secondary | ICD-10-CM | POA: Diagnosis not present

## 2021-12-05 DIAGNOSIS — D485 Neoplasm of uncertain behavior of skin: Secondary | ICD-10-CM | POA: Diagnosis not present

## 2021-12-05 DIAGNOSIS — Z85828 Personal history of other malignant neoplasm of skin: Secondary | ICD-10-CM | POA: Diagnosis not present

## 2021-12-05 DIAGNOSIS — L219 Seborrheic dermatitis, unspecified: Secondary | ICD-10-CM | POA: Diagnosis not present

## 2021-12-05 DIAGNOSIS — L570 Actinic keratosis: Secondary | ICD-10-CM | POA: Diagnosis not present

## 2021-12-05 DIAGNOSIS — L723 Sebaceous cyst: Secondary | ICD-10-CM | POA: Diagnosis not present

## 2021-12-05 DIAGNOSIS — L821 Other seborrheic keratosis: Secondary | ICD-10-CM | POA: Diagnosis not present

## 2021-12-05 DIAGNOSIS — C44519 Basal cell carcinoma of skin of other part of trunk: Secondary | ICD-10-CM | POA: Diagnosis not present

## 2021-12-05 DIAGNOSIS — L299 Pruritus, unspecified: Secondary | ICD-10-CM | POA: Diagnosis not present

## 2021-12-06 DIAGNOSIS — H02889 Meibomian gland dysfunction of unspecified eye, unspecified eyelid: Secondary | ICD-10-CM | POA: Diagnosis not present

## 2021-12-07 ENCOUNTER — Ambulatory Visit (INDEPENDENT_AMBULATORY_CARE_PROVIDER_SITE_OTHER): Payer: PPO | Admitting: Family

## 2021-12-07 ENCOUNTER — Encounter: Payer: Self-pay | Admitting: Family

## 2021-12-07 VITALS — BP 118/70 | HR 72 | Temp 98.3°F | Ht 71.0 in | Wt 198.2 lb

## 2021-12-07 DIAGNOSIS — K219 Gastro-esophageal reflux disease without esophagitis: Secondary | ICD-10-CM

## 2021-12-07 DIAGNOSIS — R42 Dizziness and giddiness: Secondary | ICD-10-CM | POA: Diagnosis not present

## 2021-12-07 DIAGNOSIS — E890 Postprocedural hypothyroidism: Secondary | ICD-10-CM | POA: Diagnosis not present

## 2021-12-07 DIAGNOSIS — I10 Essential (primary) hypertension: Secondary | ICD-10-CM

## 2021-12-07 DIAGNOSIS — E785 Hyperlipidemia, unspecified: Secondary | ICD-10-CM | POA: Diagnosis not present

## 2021-12-07 DIAGNOSIS — E1169 Type 2 diabetes mellitus with other specified complication: Secondary | ICD-10-CM | POA: Diagnosis not present

## 2021-12-07 DIAGNOSIS — R7989 Other specified abnormal findings of blood chemistry: Secondary | ICD-10-CM | POA: Diagnosis not present

## 2021-12-07 LAB — CBC WITH DIFFERENTIAL/PLATELET
Basophils Absolute: 0 10*3/uL (ref 0.0–0.1)
Basophils Relative: 0.5 % (ref 0.0–3.0)
Eosinophils Absolute: 0.1 10*3/uL (ref 0.0–0.7)
Eosinophils Relative: 2.1 % (ref 0.0–5.0)
HCT: 45.4 % (ref 39.0–52.0)
Hemoglobin: 15.4 g/dL (ref 13.0–17.0)
Lymphocytes Relative: 18.5 % (ref 12.0–46.0)
Lymphs Abs: 1 10*3/uL (ref 0.7–4.0)
MCHC: 34 g/dL (ref 30.0–36.0)
MCV: 98 fl (ref 78.0–100.0)
Monocytes Absolute: 0.5 10*3/uL (ref 0.1–1.0)
Monocytes Relative: 8.7 % (ref 3.0–12.0)
Neutro Abs: 4 10*3/uL (ref 1.4–7.7)
Neutrophils Relative %: 70.2 % (ref 43.0–77.0)
Platelets: 184 10*3/uL (ref 150.0–400.0)
RBC: 4.63 Mil/uL (ref 4.22–5.81)
RDW: 12.3 % (ref 11.5–15.5)
WBC: 5.6 10*3/uL (ref 4.0–10.5)

## 2021-12-07 LAB — MICROALBUMIN / CREATININE URINE RATIO
Creatinine,U: 139 mg/dL
Microalb Creat Ratio: 19.4 mg/g (ref 0.0–30.0)
Microalb, Ur: 27 mg/dL — ABNORMAL HIGH (ref 0.0–1.9)

## 2021-12-07 LAB — TSH: TSH: 3.01 u[IU]/mL (ref 0.35–5.50)

## 2021-12-07 LAB — COMPREHENSIVE METABOLIC PANEL
ALT: 24 U/L (ref 0–53)
AST: 17 U/L (ref 0–37)
Albumin: 4.3 g/dL (ref 3.5–5.2)
Alkaline Phosphatase: 76 U/L (ref 39–117)
BUN: 27 mg/dL — ABNORMAL HIGH (ref 6–23)
CO2: 23 mEq/L (ref 19–32)
Calcium: 9 mg/dL (ref 8.4–10.5)
Chloride: 104 mEq/L (ref 96–112)
Creatinine, Ser: 1.33 mg/dL (ref 0.40–1.50)
GFR: 54.47 mL/min — ABNORMAL LOW (ref >60.00)
Glucose, Bld: 186 mg/dL — ABNORMAL HIGH (ref 70–99)
Potassium: 3.7 mEq/L (ref 3.5–5.1)
Sodium: 138 mEq/L (ref 135–145)
Total Bilirubin: 1.1 mg/dL (ref 0.2–1.2)
Total Protein: 6.9 g/dL (ref 6.0–8.3)

## 2021-12-07 LAB — HEMOGLOBIN A1C: Hgb A1c MFr Bld: 7 % — ABNORMAL HIGH (ref 4.6–6.5)

## 2021-12-07 LAB — VITAMIN D 25 HYDROXY (VIT D DEFICIENCY, FRACTURES): VITD: 31.31 ng/mL (ref 30.00–100.00)

## 2021-12-08 ENCOUNTER — Other Ambulatory Visit: Payer: Self-pay | Admitting: Family

## 2021-12-08 MED ORDER — RYBELSUS 14 MG PO TABS: 14.0000 mg | ORAL_TABLET | Freq: Every day | ORAL | 3 refills | Status: DC

## 2021-12-08 NOTE — Progress Notes (Signed)
Established Patient Office Visit  Subjective   Patient ID: Jeff Lopez, male    DOB: 1951/01/09  Age: 71 y.o. MRN: [id number]  Chief Complaint  Patient presents with   Hypertension    Patient states his BP reading was 159/109 at Dr Randel Pigg office during a follow up visit 2 weeks ago   Dizziness    Patient complains of loss of balance and dizziness x3-4 months    HPI 71 year old male presents for a follow-up of HTN, HLD, Type 2 DM, and Hypothyroidism. He reports he has not had labs in several months and is concerned about not knowing where he stands. He was seen at another doctors office and his blood pressure was found to be 159/109. He was instructed to see his PCP asap. Patient reports overall not feeling well. Has had intermittent dizziness usually with position changes. No chest pain or SOB  Review of Systems  Neurological:  Positive for dizziness. Negative for tingling, focal weakness, loss of consciousness, weakness and headaches.  All other systems reviewed and are negative.     Objective:     BP 118/70 (BP Location: Left Arm, Patient Position: Sitting, Cuff Size: Large)   Pulse 72   Temp 98.3 F (36.8 C) (Oral)   Ht '5\' 11"'$  (1.803 m)   Wt 198 lb 3.2 oz (89.9 kg)   SpO2 96%   BMI 27.64 kg/m  BP Readings from Last 3 Encounters:  12/07/21 118/70  11/11/21 (!) 141/89  10/12/21 130/81      Physical Exam Vitals and nursing note reviewed.  Constitutional:      Appearance: Normal appearance. He is normal weight.  HENT:     Head: Normocephalic and atraumatic.     Right Ear: Tympanic membrane and ear canal normal.     Left Ear: Tympanic membrane and ear canal normal.  Cardiovascular:     Rate and Rhythm: Normal rate and regular rhythm.     Pulses: Normal pulses.     Heart sounds: Normal heart sounds.  Pulmonary:     Effort: Pulmonary effort is normal.     Breath sounds: Normal breath sounds.  Abdominal:     General: Abdomen is flat.     Palpations:  Abdomen is soft.  Musculoskeletal:        General: Normal range of motion.     Cervical back: Normal range of motion and neck supple.  Skin:    General: Skin is warm and dry.  Neurological:     General: No focal deficit present.     Mental Status: He is alert and oriented to person, place, and time.  Psychiatric:        Mood and Affect: Mood normal.        Behavior: Behavior normal.      Results for orders placed or performed in visit on 12/07/21  Vitamin D, 25-hydroxy  Result Value Ref Range   VITD 31.31 30.00 - 100.00 ng/mL  Hemoglobin A1c  Result Value Ref Range   Hgb A1c MFr Bld 7.0 (H) 4.6 - 6.5 %  TSH  Result Value Ref Range   TSH 3.01 0.35 - 5.50 uIU/mL  CMP  Result Value Ref Range   Sodium 138 135 - 145 mEq/L   Potassium 3.7 3.5 - 5.1 mEq/L   Chloride 104 96 - 112 mEq/L   CO2 23 19 - 32 mEq/L   Glucose, Bld 186 (H) 70 - 99 mg/dL   BUN 27 (H) 6 -  23 mg/dL   Creatinine, Ser 1.33 0.40 - 1.50 mg/dL   Total Bilirubin 1.1 0.2 - 1.2 mg/dL   Alkaline Phosphatase 76 39 - 117 U/L   AST 17 0 - 37 U/L   ALT 24 0 - 53 U/L   Total Protein 6.9 6.0 - 8.3 g/dL   Albumin 4.3 3.5 - 5.2 g/dL   GFR 54.47 (L) >60.00 mL/min   Calcium 9.0 8.4 - 10.5 mg/dL  CBC with Differential/Platelets  Result Value Ref Range   WBC 5.6 4.0 - 10.5 K/uL   RBC 4.63 4.22 - 5.81 Mil/uL   Hemoglobin 15.4 13.0 - 17.0 g/dL   HCT 45.4 39.0 - 52.0 %   MCV 98.0 78.0 - 100.0 fl   MCHC 34.0 30.0 - 36.0 g/dL   RDW 12.3 11.5 - 15.5 %   Platelets 184.0 150.0 - 400.0 K/uL   Neutrophils Relative % 70.2 43.0 - 77.0 %   Lymphocytes Relative 18.5 12.0 - 46.0 %   Monocytes Relative 8.7 3.0 - 12.0 %   Eosinophils Relative 2.1 0.0 - 5.0 %   Basophils Relative 0.5 0.0 - 3.0 %   Neutro Abs 4.0 1.4 - 7.7 K/uL   Lymphs Abs 1.0 0.7 - 4.0 K/uL   Monocytes Absolute 0.5 0.1 - 1.0 K/uL   Eosinophils Absolute 0.1 0.0 - 0.7 K/uL   Basophils Absolute 0.0 0.0 - 0.1 K/uL  Microalbumin / creatinine urine ratio  Result  Value Ref Range   Microalb, Ur 27.0 (H) 0.0 - 1.9 mg/dL   Creatinine,U 139.0 mg/dL   Microalb Creat Ratio 19.4 0.0 - 30.0 mg/g    Last metabolic panel Lab Results  Component Value Date   GLUCOSE 186 (H) 12/07/2021   NA 138 12/07/2021   K 3.7 12/07/2021   CL 104 12/07/2021   CO2 23 12/07/2021   BUN 27 (H) 12/07/2021   CREATININE 1.33 12/07/2021   GFRNONAA 58 (L) 07/05/2017   CALCIUM 9.0 12/07/2021   PROT 6.9 12/07/2021   ALBUMIN 4.3 12/07/2021   BILITOT 1.1 12/07/2021   ALKPHOS 76 12/07/2021   AST 17 12/07/2021   ALT 24 12/07/2021   ANIONGAP 12 04/21/2015      The 10-year ASCVD risk score (Arnett DK, et al., 2019) is: 30.7%    Assessment & Plan:   Problem List Items Addressed This Visit     Hypothyroidism following radioiodine therapy   Relevant Orders   TSH (Completed)   HYPERTENSION, BENIGN   Relevant Orders   Microalbumin / creatinine urine ratio (Completed)   CMP (Completed)   CBC with Differential/Platelets (Completed)   Hyperlipidemia associated with type 2 diabetes mellitus ([place]) - Primary   Relevant Orders   Hemoglobin A1c (Completed)   CMP (Completed)   GERD   Relevant Orders   CBC with Differential/Platelets (Completed)   Other Visit Diagnoses     Low serum vitamin D       Relevant Orders   Vitamin D, 25-hydroxy (Completed)   CBC with Differential/Platelets (Completed)   Benign hypertension       Dizziness          Will obtain labs today, notify patient pending results, and discuss treatment plan.  Return in about 3 months (around 03/09/2022) for CPX.    Kennyth Arnold, FNP

## 2021-12-14 NOTE — Progress Notes (Unsigned)
HPI: [name] is a 71 y.o. male, who is here today to establish care.  Former PCP: Dr. Ethlyn Gallery Last preventive routine visit: ***  Chronic medical problems: ***  Concerns today: ***  Review of Systems Rest see pertinent positives and negatives per HPI.  Current Outpatient Medications on File Prior to Visit  Medication Sig Dispense Refill   amLODipine (NORVASC) 5 MG tablet TAKE 1 TABLET BY MOUTH EVERY DAY 90 tablet 3   blood glucose meter kit and supplies KIT Dispense based on patient and insurance preference. Use up to four times daily as directed. 1 each 0   cholecalciferol (VITAMIN D3) 25 MCG (1000 UT) tablet Take 1,000 Units by mouth daily.     irbesartan (AVAPRO) 300 MG tablet Take 1 tablet (300 mg total) by mouth daily. 90 tablet 0   levothyroxine (SYNTHROID) 137 MCG tablet TAKE 1 TABLET BY MOUTH EVERY DAY BEFORE BREAKFAST 90 tablet 1   metoprolol succinate (TOPROL-XL) 50 MG 24 hr tablet TAKE 1 TABLET BY MOUTH EVERY DAY WITH OR IMMEDIATELY FOLLOWING A MEAL 90 tablet 3   pantoprazole (PROTONIX) 40 MG tablet TAKE 1 TABLET BY MOUTH 2 TIMES DAILY. PT NEEDS APPT FOR REFILLS 60 tablet 1   rosuvastatin (CRESTOR) 10 MG tablet Take 1 tablet (10 mg total) by mouth at bedtime. 90 tablet 3   Semaglutide (RYBELSUS) 14 MG TABS Take 1 tablet (14 mg total) by mouth daily. 30 tablet 3   No current facility-administered medications on file prior to visit.    Past Medical History:  Diagnosis Date   Allergy    per pt   CAD, NATIVE VESSEL 04/19/2010   nonobstructive by cath 2007:  oLAD 20-30%, mLAD 50%, pCFX 20-30%, oAVCFX 20-30%, L renal art 50%;  normal LVF   Cataract    bil cataracts removed   COPD (chronic obstructive pulmonary disease) ([place])    Diabetes mellitus without complication ([place])    GERD 04/09/2007   Headache(784.0)    HYPERLIPIDEMIA 04/09/2007   Patient denies.   HYPERTENSION, BENIGN 04/19/2010   Hypothyroidism    Hypothyroidism    previous  hyperthyroidism, s/p I-131   LUMBAR DISC DISORDER 05/27/2010   Nerve damage    Left leg   OSTEOARTHRITIS, LUMBAR SPINE 04/09/2007   RENAL CYST 05/27/2010   Spinal headache    After having back surgery in 2014   Allergies  Allergen Reactions   Ceftin Other (See Comments)    Patient stated it caused "sores in mouth" Thrush   Cefuroxime Axetil Other (See Comments)    Sores in mouth Sores in mouth Patient stated it caused "sores in mouth" Thrush other   Cephalexin Other (See Comments)    Other    Family History  Problem Relation Age of Onset   Breast cancer Mother    Uterine cancer Mother    Heart disease Mother    Heart attack Mother 58   Prostate cancer Father    Hypertension Brother    Non-Hodgkin's lymphoma Brother    Stomach cancer Paternal Grandmother    Thyroid disease Neg Hx    Colon cancer Neg Hx    Esophageal cancer Neg Hx    Rectal cancer Neg Hx    Pancreatic cancer Neg Hx    Colon polyps Neg Hx     Social History   Socioeconomic History   Marital status: Single    Spouse name: Not on file   Number of children: 1   Years of  education: Not on file   Highest education level: Not on file  Occupational History   Occupation: DISTRIBUTION [employer]    Employer: [employer]  Tobacco Use   Smoking status: Former    Packs/day: 1.00    Years: 37.00    Total pack years: 37.00    Types: Cigarettes    Quit date: 04/06/2008    Years since quitting: 13.6   Smokeless tobacco: Never   Tobacco comments:    pt has stopped smoking about 9 months now  Vaping Use   Vaping Use: Never used  Substance and Sexual Activity   Alcohol use: No   Drug use: No   Sexual activity: Not Currently  Other Topics Concern   Not on file  Social History Narrative   Works in Psychologist, educational. Retired.   Lives alone, has one child in [place].    Social Determinants of Health   Financial Resource Strain: Low Risk  (06/08/2021)   Overall Financial Resource Strain (CARDIA)     Difficulty of Paying Living Expenses: Not hard at all  Food Insecurity: No Food Insecurity (06/08/2021)   Hunger Vital Sign    Worried About Running Out of Food in the Last Year: Never true    Ran Out of Food in the Last Year: Never true  Transportation Needs: Unknown (06/08/2021)   PRAPARE - Hydrologist (Medical): No    Lack of Transportation (Non-Medical): Not on file  Physical Activity: Insufficiently Active (06/08/2021)   Exercise Vital Sign    Days of Exercise per Week: 3 days    Minutes of Exercise per Session: 40 min  Stress: No Stress Concern Present (06/08/2021)   [place]    Feeling of Stress : Not at all  Social Connections: Socially Isolated (06/08/2021)   Social Connection and Isolation Panel [NHANES]    Frequency of Communication with Friends and Family: More than three times a week    Frequency of Social Gatherings with Friends and Family: More than three times a week    Attends Religious Services: Never    Marine scientist or Organizations: No    Attends Archivist Meetings: Never    Marital Status: Divorced    There were no vitals filed for this visit.  There is no height or weight on file to calculate BMI.  Physical Exam  ASSESSMENT AND PLAN:  There are no diagnoses linked to this encounter.  No follow-ups on file.  Betty G. Martinique, MD  [facility]. [place] office.

## 2021-12-16 ENCOUNTER — Ambulatory Visit (INDEPENDENT_AMBULATORY_CARE_PROVIDER_SITE_OTHER): Payer: PPO | Admitting: Family Medicine

## 2021-12-16 ENCOUNTER — Encounter: Payer: Self-pay | Admitting: Family Medicine

## 2021-12-16 VITALS — BP 128/80 | HR 74 | Resp 16 | Ht 71.0 in | Wt 197.4 lb

## 2021-12-16 DIAGNOSIS — E1169 Type 2 diabetes mellitus with other specified complication: Secondary | ICD-10-CM | POA: Diagnosis not present

## 2021-12-16 DIAGNOSIS — I10 Essential (primary) hypertension: Secondary | ICD-10-CM

## 2021-12-16 DIAGNOSIS — E1143 Type 2 diabetes mellitus with diabetic autonomic (poly)neuropathy: Secondary | ICD-10-CM

## 2021-12-16 DIAGNOSIS — R42 Dizziness and giddiness: Secondary | ICD-10-CM

## 2021-12-16 DIAGNOSIS — I700 Atherosclerosis of aorta: Secondary | ICD-10-CM

## 2021-12-16 DIAGNOSIS — E785 Hyperlipidemia, unspecified: Secondary | ICD-10-CM

## 2021-12-16 DIAGNOSIS — I25119 Atherosclerotic heart disease of native coronary artery with unspecified angina pectoris: Secondary | ICD-10-CM | POA: Diagnosis not present

## 2021-12-17 ENCOUNTER — Encounter: Payer: Self-pay | Admitting: Orthopedic Surgery

## 2021-12-17 MED ORDER — LIDOCAINE HCL 1 % IJ SOLN
5.0000 mL | INTRAMUSCULAR | Status: AC | PRN
  Administered 2021-12-02: 5 mL

## 2021-12-17 MED ORDER — METHYLPREDNISOLONE ACETATE 40 MG/ML IJ SUSP
40.0000 mg | INTRAMUSCULAR | Status: AC | PRN
  Administered 2021-12-02: 40 mg via INTRA_ARTICULAR

## 2021-12-17 MED ORDER — BUPIVACAINE HCL 0.5 % IJ SOLN
9.0000 mL | INTRAMUSCULAR | Status: AC | PRN
  Administered 2021-12-02: 9 mL via INTRA_ARTICULAR

## 2021-12-22 ENCOUNTER — Encounter (HOSPITAL_COMMUNITY): Payer: PPO

## 2021-12-22 ENCOUNTER — Other Ambulatory Visit: Payer: PPO

## 2021-12-28 ENCOUNTER — Ambulatory Visit: Payer: PPO | Admitting: Gastroenterology

## 2021-12-28 ENCOUNTER — Encounter: Payer: Self-pay | Admitting: Gastroenterology

## 2021-12-28 DIAGNOSIS — R1310 Dysphagia, unspecified: Secondary | ICD-10-CM

## 2021-12-28 DIAGNOSIS — K219 Gastro-esophageal reflux disease without esophagitis: Secondary | ICD-10-CM | POA: Diagnosis not present

## 2021-12-28 MED ORDER — PANTOPRAZOLE SODIUM 40 MG PO TBEC: 40.0000 mg | DELAYED_RELEASE_TABLET | Freq: Two times a day (BID) | ORAL | 3 refills | Status: DC

## 2021-12-28 NOTE — Progress Notes (Signed)
Review of pertinent gastrointestinal problems:   1. GERD, intermittent dysphagia that is likely GERD related. EGD February 2010 found 2 cm hiatal hernia, no stricture, slightly irregular Z line that was biopsied and biopsies showed no sign of intestinal metaplasia.   2. Adenomatous polyps: colonoscopy with SML in 03/2004 found 9m HP polyp. Repeat colonoscopy 2013 (Manfred Laspina), done for new bleeding found two polyps, hemorrhoids.  Polyps were adenomas on pathology. Colonoscopy Dr. JArdis Hughs3/2018: six subCM polyps removed, 5 were adenomas.  Colonoscopy 10/2019 single subcentimeter tubular adenoma removed.  Recommended recall at 7-year interval 3. Gallstone disease: acute cholecystitis, he underwent laparoscopic cholecystectomy January 2015. The intraoperative cholangiogram suggested filling defects in the common bile duct. ERCP done by Dr. JArdis HughsJanuary 2015 performed sphincterotomy and stone removal went smoothly.   HPI: This is a very pleasant 71year old man whom I last saw the time of colonoscopy about 2 years ago.  He is here today for a different problem.   Blood work June 2023 CBC was normal, hemoglobin A1c was 7.0, TSH was normal, complete metabolic profile was overall normal.  He has heartburn, very mild intermittent dysphagia to some solid foods and also to Tylenol large pills.  He takes Protonix 40 mg pills 1 pill twice daily and this completely controls his heartburn.  His weight is up.  He has no overt GI bleeding.  He is having a lot of new left hip pains   ROS: complete GI ROS as described in HPI, all other review negative.  Constitutional:  No unintentional weight loss   Past Medical History:  Diagnosis Date   Allergy    per pt   CAD, NATIVE VESSEL 04/19/2010   nonobstructive by cath 2007:  oLAD 20-30%, mLAD 50%, pCFX 20-30%, oAVCFX 20-30%, L renal art 50%;  normal LVF   Cataract    bil cataracts removed   COPD (chronic obstructive pulmonary disease) [place]    Diabetes  mellitus without complication ([place]    GERD 04/09/2007   Headache(784.0)    HYPERLIPIDEMIA 04/09/2007   Patient denies.   HYPERTENSION, BENIGN 04/19/2010   Hypothyroidism    Hypothyroidism    previous hyperthyroidism, s/p I-131   LUMBAR DISC DISORDER 05/27/2010   Nerve damage    Left leg   OSTEOARTHRITIS, LUMBAR SPINE 04/09/2007   RENAL CYST 05/27/2010   Spinal headache    After having back surgery in 2014    Past Surgical History:  Procedure Laterality Date   BACK SURGERY  [id number]  CARDIAC CATHETERIZATION  2007   Dr ELoanne Drilling  CHOLECYSTECTOMY     CHOLECYSTECTOMY N/A 07/13/2013   Procedure: LAPAROSCOPIC CHOLECYSTECTOMY WITH INTRAOPERATIVE CHOLANGIOGRAM;  Surgeon: DShann Medal MD;  [facility];  Service: General;  Laterality: N/A;   COLONOSCOPY  2018   COLONOSCOPY W/ POLYPECTOMY     ERCP N/A 07/14/2013   Procedure: ENDOSCOPIC RETROGRADE CHOLANGIOPANCREATOGRAPHY (ERCP);  Surgeon: DMilus Banister MD;  [facility];  Service: Endoscopy;  Laterality: N/A;   EYE SURGERY Bilateral    Cataract removal   FOOT FRACTURE SURGERY     FRACTURE SURGERY     LUMBAR [place]URGERY  08/06/2012   L3 & L4   LUMBAR DISC SURGERY  11/11/2015   L1 L2    DR NITKA   LUMBAR FUSION N/A 04/20/2015   Procedure: T12 to L1 fusion (Extension of Previous Fusion L2-S1 to T12-S1), Right Transforaminal lumbar interbody fusion, Posterior Fusion T12 to L1, with Pedicle screws, allograft, local bone graft, and  Vivigen;  Surgeon: [name], MD;  Location: [place];  Service: Orthopedics;  Laterality: N/A;   LUMBAR LAMINECTOMY N/A 11/10/2013   Procedure: Left L1-2 far lateral approach to excise herniated nucleus pulposus;  Surgeon: [name], MD;  Location: [place];  Service: Orthopedics;  Laterality: N/A;   LUMBAR LAMINECTOMY/DECOMPRESSION MICRODISCECTOMY Left 08/10/2012   Procedure: Dura Repair Left Side L2-L3;  Surgeon: [name], MD;  Location: [place];  Service: Orthopedics;  Laterality: Left;   Wilson Frame, Sliding table, dura repair kit, microscope   NECK SURGERY     X 2   SINOSCOPY     SPINE SURGERY  2006   C-spine surgery x 2   UPPER GASTROINTESTINAL ENDOSCOPY      Current Outpatient Medications  Medication Instructions   amLODipine (NORVASC) 5 MG tablet TAKE 1 TABLET BY MOUTH EVERY DAY   cholecalciferol (VITAMIN D3) 1,000 Units, Oral, Daily   irbesartan (AVAPRO) 300 mg, Oral, Daily   levothyroxine (SYNTHROID) 137 MCG tablet TAKE 1 TABLET BY MOUTH EVERY DAY BEFORE BREAKFAST   metoprolol succinate (TOPROL-XL) 50 MG 24 hr tablet TAKE 1 TABLET BY MOUTH EVERY DAY WITH OR IMMEDIATELY FOLLOWING A MEAL   pantoprazole (PROTONIX) 40 MG tablet TAKE 1 TABLET BY MOUTH 2 TIMES DAILY. PT NEEDS APPT FOR REFILLS   rosuvastatin (CRESTOR) 10 mg, Oral, 3 times weekly   Rybelsus 14 mg, Oral, Daily    Allergies as of 12/28/2021 - Review Complete 12/28/2021  Allergen Reaction Noted   Ceftin Other (See Comments) 07/31/2011   Cefuroxime axetil Other (See Comments) 07/31/2011   Cephalexin Other (See Comments) 07/03/2018    Family History  Problem Relation Age of Onset   Breast cancer Mother    Uterine cancer Mother    Heart disease Mother    Heart attack Mother 68   Prostate cancer Father    Hypertension Brother    Non-Hodgkin's lymphoma Brother    Stomach cancer Paternal Grandmother    Thyroid disease Neg Hx    Colon cancer Neg Hx    Esophageal cancer Neg Hx    Rectal cancer Neg Hx    Pancreatic cancer Neg Hx    Colon polyps Neg Hx     Social History   Socioeconomic History   Marital status: Single    Spouse name: Not on file   Number of children: 1   Years of education: Not on file   Highest education level: Not on file  Occupational History   Occupation: DISTRIBUTION MGR    Employer: [employer]  Tobacco Use   Smoking status: Former    Packs/day: 1.00    Years: 37.00    Total pack years: 37.00    Types: Cigarettes    Quit date: 04/06/2008    Years  since quitting: 13.7   Smokeless tobacco: Never   Tobacco comments:    pt has stopped smoking about 9 months now  Vaping Use   Vaping Use: Never used  Substance and Sexual Activity   Alcohol use: No   Drug use: No   Sexual activity: Not Currently  Other Topics Concern   Not on file  Social History Narrative   Works in Psychologist, educational. Retired.   Lives alone, has one child in [place].    Social Determinants of Health   Financial Resource Strain: Low Risk  (06/08/2021)   Overall Financial Resource Strain (CARDIA)    Difficulty of Paying Living Expenses: Not hard at all  Food Insecurity: No  Food Insecurity (06/08/2021)   Hunger Vital Sign    Worried About Running Out of Food in the Last Year: Never true    [place] in the Last Year: Never true  Transportation Needs: Unknown (06/08/2021)   PRAPARE - Hydrologist (Medical): No    Lack of Transportation (Non-Medical): Not on file  Physical Activity: Insufficiently Active (06/08/2021)   Exercise Vital Sign    Days of Exercise per Week: 3 days    Minutes of Exercise per Session: 40 min  Stress: No Stress Concern Present (06/08/2021)   [place]    Feeling of Stress : Not at all  Social Connections: Socially Isolated (06/08/2021)   Social Connection and Isolation Panel [NHANES]    Frequency of Communication with Friends and Family: More than three times a week    Frequency of Social Gatherings with Friends and Family: More than three times a week    Attends Religious Services: Never    Marine scientist or Organizations: No    Attends Archivist Meetings: Never    Marital Status: Divorced  Human resources officer Violence: Not At Risk (06/08/2021)   Humiliation, Afraid, Rape, and Kick questionnaire    Fear of Current or Ex-Partner: No    Emotionally Abused: No    Physically Abused: No    Sexually Abused: No      Physical Exam: BP (!) 144/84 (BP Location: Left Arm, Patient Position: Sitting, Cuff Size: Normal)   Pulse 68   Ht 5' 8.25" (1.734 m) Comment: height measured without shoes  Wt 201 lb 4 oz (91.3 kg)   BMI 30.38 kg/m  Constitutional: generally well-appearing Psychiatric: alert and oriented x3 Abdomen: soft, nontender, nondistended, no obvious ascites, no peritoneal signs, normal bowel sounds No peripheral edema noted in lower extremities  Assessment and plan: 71 y.o. male with chronic GERD, mild intermittent solid food dysphagia  His dysphagia is relatively new.  Fortunately he is not losing weight and so my suspicion for underlying neoplasm is quite low.  He will continue his Protonix 40 mg twice daily.  We will call him in a new prescription for this.  Given his relatively new dysphagia I recommend repeat EGD, it has been many many years since he has had  one.  Please see the "Patient Instructions" section for addition details about the plan.  Owens Loffler, MD [facility] 12/28/2021, 8:42 AM   Total time on date of encounter was 25 minutes (this included time spent preparing to see the patient reviewing records; obtaining and/or reviewing separately obtained history; performing a medically appropriate exam and/or evaluation; counseling and educating the patient and family if present; ordering medications, tests or procedures if applicable; and documenting clinical information in the health record).

## 2021-12-28 NOTE — Patient Instructions (Signed)
If you are age 70 or older, your body mass index should be between 23-30. Your Body mass index is 30.38 kg/m. If this is out of the aforementioned range listed, please consider follow up with your Primary Care Provider. ________________________________________________________  The [facility] providers would like to encourage you to use [facility] to communicate with providers for non-urgent requests or questions.  Due to long hold times on the telephone, sending your provider a message by [facility] may be a faster and more efficient way to get a response.  Please allow 48 business hours for a response.  Please remember that this is for non-urgent requests.  _______________________________________________________  We have sent the following medications to your pharmacy for you to pick up at your convenience:  Pantoprazole '40mg'$    You have been scheduled for an endoscopy. Please follow written instructions given to you at your visit today. If you use inhalers (even only as needed), please bring them with you on the day of your procedure.  Due to recent changes in healthcare laws, you may see the results of your imaging and laboratory studies on MyChart before your provider has had a chance to review them.  We understand that in some cases there may be results that are confusing or concerning to you. Not all laboratory results come back in the same time frame and the provider may be waiting for multiple results in order to interpret others.  Please give [place] 48 hours in order for your provider to thoroughly review all the results before contacting the office for clarification of your results.   Thank you for entrusting me with your care and choosing [facility].  Dr Ardis Hughs

## 2021-12-30 ENCOUNTER — Encounter (HOSPITAL_COMMUNITY): Payer: PPO

## 2022-01-02 ENCOUNTER — Telehealth: Payer: Self-pay | Admitting: *Deleted

## 2022-01-02 NOTE — Telephone Encounter (Signed)
[facility] faxed a refill request for Eszopicilone '3mg'$ -take 1 tablet at bedtime as needed-#90.

## 2022-01-02 NOTE — Telephone Encounter (Signed)
Rx was discontinued last visit per patient preference.

## 2022-01-03 ENCOUNTER — Other Ambulatory Visit (HOSPITAL_BASED_OUTPATIENT_CLINIC_OR_DEPARTMENT_OTHER): Payer: Self-pay | Admitting: Cardiovascular Disease

## 2022-01-06 ENCOUNTER — Other Ambulatory Visit: Payer: Self-pay | Admitting: Family Medicine

## 2022-01-06 MED ORDER — ESZOPICLONE 3 MG PO TABS: 3.0000 mg | ORAL_TABLET | Freq: Every day | ORAL | 0 refills | Status: DC

## 2022-01-06 NOTE — Telephone Encounter (Signed)
Requesting refill of Eszopiclone 3 MG TABS

## 2022-01-09 DIAGNOSIS — L720 Epidermal cyst: Secondary | ICD-10-CM | POA: Diagnosis not present

## 2022-01-09 DIAGNOSIS — L723 Sebaceous cyst: Secondary | ICD-10-CM | POA: Diagnosis not present

## 2022-01-11 ENCOUNTER — Other Ambulatory Visit: Payer: Self-pay

## 2022-01-11 DIAGNOSIS — Z122 Encounter for screening for malignant neoplasm of respiratory organs: Secondary | ICD-10-CM

## 2022-01-11 DIAGNOSIS — Z87891 Personal history of nicotine dependence: Secondary | ICD-10-CM

## 2022-01-13 ENCOUNTER — Ambulatory Visit: Payer: Self-pay

## 2022-01-13 ENCOUNTER — Ambulatory Visit (INDEPENDENT_AMBULATORY_CARE_PROVIDER_SITE_OTHER): Payer: PPO | Admitting: Surgical

## 2022-01-13 ENCOUNTER — Encounter: Payer: Self-pay | Admitting: Orthopedic Surgery

## 2022-01-13 DIAGNOSIS — M25512 Pain in left shoulder: Secondary | ICD-10-CM

## 2022-01-13 DIAGNOSIS — M7502 Adhesive capsulitis of left shoulder: Secondary | ICD-10-CM | POA: Diagnosis not present

## 2022-01-13 MED ORDER — METHOCARBAMOL 500 MG PO TABS: 500.0000 mg | ORAL_TABLET | Freq: Three times a day (TID) | ORAL | 1 refills | Status: DC | PRN

## 2022-01-13 MED ORDER — BUPIVACAINE HCL 0.5 % IJ SOLN
9.0000 mL | INTRAMUSCULAR | Status: AC | PRN
  Administered 2022-01-13: 9 mL via INTRA_ARTICULAR

## 2022-01-13 MED ORDER — METHYLPREDNISOLONE ACETATE 40 MG/ML IJ SUSP
40.0000 mg | INTRAMUSCULAR | Status: AC | PRN
  Administered 2022-01-13: 40 mg via INTRA_ARTICULAR

## 2022-01-13 MED ORDER — LIDOCAINE HCL 1 % IJ SOLN
5.0000 mL | INTRAMUSCULAR | Status: AC | PRN
  Administered 2022-01-13: 5 mL

## 2022-01-13 NOTE — Progress Notes (Signed)
Office Visit Note   Patient: Jeff Lopez           Date of Birth: [birth year]           MRN: [id number] Visit Date: 01/13/2022 Requested by: No referring provider defined for this encounter. PCP: [place], Betty G, MD  Subjective: Chief Complaint  Patient presents with   Left Shoulder - Follow-up    HPI: Jeff Lopez is a 71 y.o. male who presents to the office complaining of left shoulder stiffness.  Previous left glenohumeral injection on 12/02/2021 with 50% relief.  Not really much pain anymore but does note continued stiffness with rotation and with abduction.  Bigger complaint today is left-sided low back pain.  He has been icing it with good relief.  He has a history of 4 prior back surgeries by Dr. Louanne Skye and states that his lumbar spine is "completely fused".  Pain is worse in the morning with associated stiffness.  Localizes it to the left paraspinal region around the level of L3-L4.  No radicular pain down the leg.  No weakness.  Pain is worse with bending over.  He used to walk daily for his back pain and to lose weight but he recently stopped and that is when this back pain started..                ROS: All systems reviewed are negative as they relate to the chief complaint within the history of present illness.  Patient denies fevers or chills.  Assessment & Plan: Visit Diagnoses:  1. Adhesive capsulitis of left shoulder   2. Left shoulder pain, unspecified chronicity     Plan: Patient is a 71 year old male who presents for evaluation of left shoulder adhesive capsulitis.  Previous injection 6 weeks ago helped significantly but he does note continued stiffness.  Not much pain.  He has been doing occasional home exercise program for left shoulder range of motion but does not do this every day.  Strongly encouraged him to do this daily if he wants his shoulder to improve in regards to range of motion.  He particularly has to work on external rotation.  Ultrasound-guided  left glenohumeral injection again administered today.  Follow-up in 6 weeks.  He also has history of low back pain that has been worse over the last several weeks since he stopped his walking program.  He will start this back up.  Also sent in a prescription for muscle relaxer.  Recheck this in 6 weeks.  Follow-Up Instructions: No follow-ups on file.   Orders:  Orders Placed This Encounter  Procedures   US Guided Needle Placement - No Linked Charges   Meds ordered this encounter  Medications   methocarbamol (ROBAXIN) 500 MG tablet    Sig: Take 1 tablet (500 mg total) by mouth every 8 (eight) hours as needed for muscle spasms.    Dispense:  30 tablet    Refill:  1      Procedures: Large Joint Inj: L glenohumeral on 01/13/2022 11:59 AM Indications: diagnostic evaluation and pain Details: 22 G 3.5 in needle, ultrasound-guided posterior approach  Arthrogram: No  Medications: 9 mL bupivacaine 0.5 %; 40 mg methylPREDNISolone acetate 40 MG/ML; 5 mL lidocaine 1 % Outcome: tolerated well, no immediate complications Procedure, treatment alternatives, risks and benefits explained, specific risks discussed. Consent was given by the patient. Immediately prior to procedure a time out was called to verify the correct patient, procedure, equipment, support staff and site/side marked  as required. Patient was prepped and draped in the usual sterile fashion.       Clinical Data: No additional findings.  Objective: Vital Signs: There were no vitals taken for this visit.  Physical Exam:  Constitutional: Patient appears well-developed HEENT:  Head: Normocephalic Eyes:EOM are normal Neck: Normal range of motion Cardiovascular: Normal rate Pulmonary/chest: Effort normal Neurologic: Patient is alert Skin: Skin is warm Psychiatric: Patient has normal mood and affect  Ortho Exam: Ortho exam demonstrates left shoulder with 30 degrees external rotation, 100 degrees abduction, 160 degrees  forward flexion. Rotator cuff strength of supra, infra, subscap with no coarse grinding or crepitus noted.  No significant tenderness throughout the axial lumbar spine or right-sided paraspinal musculature.  Moderate tenderness over the left-sided paraspinal musculature.  No tenderness over the greater trochanter.  No pain with hip range of motion.  5/5 motor strength of bilateral hip flexion, quadricep, hamstring, dorsiflexion, plantarflexion.  Specialty Comments:  No specialty comments available.  Imaging: No results found.   PMFS History: Patient Active Problem List   Diagnosis Date Noted   Pruritus 08/24/2020   OSA (obstructive sleep apnea) 11/25/2018   Non-restorative sleep 11/25/2018   Malignant neoplasm of ascending colon ([place]) 05/16/2018   Low testosterone 03/02/2018   Gynecomastia 02/28/2018   Left lower quadrant pain 12/26/2017   Arthralgia 10/03/2017   Numbness 10/03/2017   Chronic pansinusitis 01/09/2017   Deviated septum 01/09/2017   Chronic left sacroiliac pain 10/19/2016   Chronic left-sided low back pain with left-sided sciatica 07/31/2016   Spinal stenosis, lumbar region, with neurogenic claudication 04/21/2015    Class: Chronic   Spondylolisthesis of lumbar region 04/21/2015    Class: Chronic   Hypothyroidism following radioiodine therapy 03/10/2015   Screening for prostate cancer 03/10/2015   Hemochromatosis 03/27/2014   Choledocholithiasis 07/14/2013   Nonspecific (abnormal) findings on radiological and other examination of biliary tract 07/13/2013   Calculus of gallbladder with acute cholecystitis, without mention of obstruction 07/13/2013   Calculus of bile duct without mention of cholecystitis or obstruction 07/13/2013   Abdominal pain 07/12/2013   Neck pain on left side 05/30/2013   Quit smoking 11/28/2012   Postoperative CSF leak 08/10/2012    Class: Acute   Degenerative disc disease, lumbar 08/06/2012    Class: Chronic   HNP (herniated nucleus  pulposus), lumbar 08/06/2012    Class: Acute   Type II diabetes mellitus with peripheral autonomic neuropathy ([place]) 06/10/2012   COPD GOLD 0 02/09/2012   Chest pain at rest 02/06/2012   Mixed allergic and non-allergic rhinitis 02/06/2012   Radiculopathy of leg 11/05/2010   RENAL CYST 05/27/2010   LUMBAR [place] DISORDER 05/27/2010   HYPERTENSION, BENIGN 04/19/2010   CAD, NATIVE VESSEL 04/19/2010   Hyperlipidemia associated with type 2 diabetes mellitus ([place]) 04/09/2007   GERD 04/09/2007   OSTEOARTHRITIS, LUMBAR SPINE 04/09/2007   Past Medical History:  Diagnosis Date   Allergy    per pt   CAD, NATIVE VESSEL 04/19/2010   nonobstructive by cath 2007:  oLAD 20-30%, mLAD 50%, pCFX 20-30%, oAVCFX 20-30%, L renal art 50%;  normal LVF   Cataract    bil cataracts removed   COPD (chronic obstructive pulmonary disease) ([place])    Diabetes mellitus without complication ([place])    GERD 04/09/2007   Headache(784.0)    HYPERLIPIDEMIA 04/09/2007   Patient denies.   HYPERTENSION, BENIGN 04/19/2010   Hypothyroidism    Hypothyroidism    previous hyperthyroidism, s/p I-131   LUMBAR [place] DISORDER 05/27/2010  Nerve damage    Left leg   OSTEOARTHRITIS, LUMBAR SPINE 04/09/2007   RENAL CYST 05/27/2010   Spinal headache    After having back surgery in 2014    Family History  Problem Relation Age of Onset   Breast cancer Mother    Uterine cancer Mother    Heart disease Mother    Heart attack Mother 29   Prostate cancer Father    Hypertension Brother    Non-Hodgkin's lymphoma Brother    Stomach cancer Paternal Grandmother    Thyroid disease Neg Hx    Colon cancer Neg Hx    Esophageal cancer Neg Hx    Rectal cancer Neg Hx    Pancreatic cancer Neg Hx    Colon polyps Neg Hx     Past Surgical History:  Procedure Laterality Date   BACK SURGERY  2012,2014   CARDIAC CATHETERIZATION  2007   Dr Loanne Drilling   CHOLECYSTECTOMY     CHOLECYSTECTOMY N/A 07/13/2013   Procedure: LAPAROSCOPIC CHOLECYSTECTOMY  WITH INTRAOPERATIVE CHOLANGIOGRAM;  Surgeon: Shann Medal, MD;  [facility];  Service: General;  Laterality: N/A;   COLONOSCOPY  2018   COLONOSCOPY W/ POLYPECTOMY     ERCP N/A 07/14/2013   Procedure: ENDOSCOPIC RETROGRADE CHOLANGIOPANCREATOGRAPHY (ERCP);  Surgeon: [name], MD;  [facility];  Service: Endoscopy;  Laterality: N/A;   EYE SURGERY Bilateral    Cataract removal   FOOT FRACTURE SURGERY     FRACTURE SURGERY     LUMBAR [place] SURGERY  08/06/2012   L3 & L4   LUMBAR DISC SURGERY  11/11/2015   L1 L2    DR NITKA   LUMBAR FUSION N/A 04/20/2015   Procedure: T12 to L1 fusion (Extension of Previous Fusion L2-S1 to T12-S1), Right Transforaminal lumbar interbody fusion, Posterior Fusion T12 to L1, with Pedicle screws, allograft, local bone graft, and  Vivigen;  Surgeon: [name], MD;  Location: [place];  Service: Orthopedics;  Laterality: N/A;   LUMBAR LAMINECTOMY N/A 11/10/2013   Procedure: Left L1-2 far lateral approach to excise herniated nucleus pulposus;  Surgeon: [name], MD;  Location: [place];  Service: Orthopedics;  Laterality: N/A;   LUMBAR LAMINECTOMY/DECOMPRESSION MICRODISCECTOMY Left 08/10/2012   Procedure: Dura Repair Left Side L2-L3;  Surgeon: [name], MD;  Location: [place];  Service: Orthopedics;  Laterality: Left;  Wilson Frame, Sliding table, dura repair kit, microscope   NECK SURGERY     X 2   SINOSCOPY     SPINE SURGERY  2006   C-spine surgery x 2   UPPER GASTROINTESTINAL ENDOSCOPY     Social History   Occupational History   Occupation: DISTRIBUTION MGR    Employer: [employer]  Tobacco Use   Smoking status: Former    Packs/day: 1.00    Years: 37.00    Total pack years: 37.00    Types: Cigarettes    Quit date: 04/06/2008    Years since quitting: 13.7   Smokeless tobacco: Never   Tobacco comments:    pt has stopped smoking about 9 months now  Vaping Use   Vaping Use: Never used  Substance and Sexual Activity    Alcohol use: No   Drug use: No   Sexual activity: Not Currently

## 2022-01-19 ENCOUNTER — Telehealth: Payer: Self-pay | Admitting: Family Medicine

## 2022-01-19 NOTE — Telephone Encounter (Signed)
Patient would like to go back to 90 day supply for his Eszopiclone 3 MG TABS  Patient states that he never said he wanted a 30 day supply instead   Patient would also like 60 pills sent in so he can add them to the 30 he already received. Patient will run out of medication before he sees Dr.Jordan for physical.     Please send to   [place], [place] - [address] Phone:  [phone]  Fax:  [phone]       Please advise

## 2022-01-20 NOTE — Telephone Encounter (Signed)
Can we set up a virtual to discuss medication? They never discussed it during his visit with her & it's a controlled substance. Can be telephone. Thank you!

## 2022-01-24 ENCOUNTER — Encounter: Payer: Self-pay | Admitting: Family Medicine

## 2022-01-24 ENCOUNTER — Telehealth (INDEPENDENT_AMBULATORY_CARE_PROVIDER_SITE_OTHER): Payer: PPO | Admitting: Family Medicine

## 2022-01-24 VITALS — Ht 68.25 in

## 2022-01-24 DIAGNOSIS — G4701 Insomnia due to medical condition: Secondary | ICD-10-CM

## 2022-01-24 DIAGNOSIS — G4733 Obstructive sleep apnea (adult) (pediatric): Secondary | ICD-10-CM

## 2022-01-24 MED ORDER — ESZOPICLONE 3 MG PO TABS: 3.0000 mg | ORAL_TABLET | Freq: Every day | ORAL | 3 refills | Status: DC

## 2022-01-24 NOTE — Progress Notes (Signed)
Virtual Visit via Telephone Note I connected with Jeff Lopez on 01/24/22 at 12:00 PM EDT by telephone and verified that I am speaking with the correct person using two identifiers.   I discussed the limitations, risks, security and privacy concerns of performing an evaluation and management service by telephone and the availability of in person appointments. I also discussed with the patient that there may be a patient responsible charge related to this service. The patient expressed understanding and agreed to proceed.  Location patient: home Location provider: work office Participants present for the call: patient, provider Patient did not have a visit in the prior 7 days to address this/these issue(s).  Chief Complaint  Patient presents with   Medication Management    Lunesta - patient usually gets 90 days at a time, medication wasn't discussed during last visit.    History of Present Illness: Jeff Lopez is a 71 y.o.male with hx of OSA, DM 2, GERD, hyperlipidemia, hypertension, allergy rhinitis, and former smoker following today on insomnia. I saw him on 12/16/2021, when he establish care, we did discuss this problem. Diagnosed with OSA in 06/2018, he tried CPAP therapy for about 4 to 6 weeks but was not able to tolerate a pressures and mask fitting.  He was recommended to follow-up with Dr. Toy Cookey to discuss oral appliance for OSA management.  According to patient, prescription for Lunesta 3 mg was initially started by Dr Cicero Duck, pulmonologist and "somehow" former PCP ended up filling prescription for 11-monthsupply at the time. He has tolerated medication well. He tried Ambien in the past, felt drowsy the next day.  He is able to sleep 6 to 8 hours. When he does not take Lunesta, he wakes up every 2 hours to urinate. He has tried OTC sleep aid medications, unsuccessfully.  Observations/Objective: Patient sounds cheerful and well on the phone. I do not appreciate any  SOB. Speech and thought processing are grossly intact. Patient reported vitals:Ht 5' 8.25" (1.734 m)   BMI 30.38 kg/m   Assessment and Plan:  OSA (obstructive sleep apnea) Did not tolerate CPAP,he is not interested in further treatments. He is not longer following with pulmonologist.  Insomnia due to medical condition OSA, did not tolerate CPAP. Lunesta 3 mg daily at bedtime has helped, so no changes.  Explained that I do not prescribe 378-monthupply at the time of controlled medications. He understands side effects and so far he has tolerated medication well. Continue adequate sleep hygiene.  Follow Up Instructions:  Return if symptoms worsen or fail to improve, for Keep next f/u appt.  I did not refer this patient for an OV in the next 24 hours for this/these issue(s).  I discussed the assessment and treatment plan with the patient. The patient was provided an opportunity to ask questions and all were answered. The patient agreed with the plan and demonstrated an understanding of the instructions.   The patient was advised to call back or seek an in-person evaluation if the symptoms worsen or if the condition fails to improve as anticipated.  I provided 11 minutes of non-face-to-face time during this encounter.  Dimitrious Micciche G. JoMartiniqueMD  [facility]ice.

## 2022-01-24 NOTE — Assessment & Plan Note (Signed)
OSA, did not tolerate CPAP. Lunesta 3 mg daily at bedtime has helped, so no changes.  Explained that I do not prescribe 36-monthsupply at the time of controlled medications. He understands side effects and so far he has tolerated medication well. Continue adequate sleep hygiene.

## 2022-01-24 NOTE — Assessment & Plan Note (Addendum)
Did not tolerate CPAP,he is not interested in further treatments. He is not longer following with pulmonologist.

## 2022-02-02 ENCOUNTER — Telehealth: Payer: Self-pay | Admitting: Pharmacist

## 2022-02-02 NOTE — Chronic Care Management (AMB) (Signed)
Chronic Care Management Pharmacy Assistant   Name: Jeff Lopez  MRN: [id number] DOB: December 19, 1950  Reason for Encounter: Disease State   Conditions to be addressed/monitored: HTN  Recent office visits:  01/24/22 Martinique, Betty G, MD - Patient presented via video for Insomnia due to medical condition and other concerns. No medication changes.  12/16/21 Martinique, Betty G, MD - Patient presented for Type 2 diabetes mellitus with peripheral autonomic neuropathy and other concerns. Stopped Rosuvastatin 10 mg.  12/07/21 Dutch Quint B, FNP - Patient presented for Hyperlipidemia associated with type 2 diabetes mellitus and other concerns. Stopped Clobetasol Propionate. Stopped Desonide. Stopped Eszopiclone. Stopped Fluticasone. Stopped Metformin. Stopped Tamiflu.     Recent consult visits:  01/13/22 Magnant, Gerrianne Scale, PA-C (Orthopedic Surg) - Patient presented for Adhesive capsulitis of left shoulder and other concerns. Prescribed Methocarbamol 500 mg.  12/28/21 [name], MD [facility]) - Patient presented for Gastroesophageal reflux disease without esophagitis. Changed Pantoprazole.Injection administered  12/05/21 Axner, Marye Round (Derm) - Patient presented for History of basal cell carcinoma ad other concerns. No medication changes.  12/02/21 Meredith Pel, MD (Orthopedic Surg) - Patient presented for Bursitis of left shoulder. Injection administered. No medication cahnges  11/18/21 [name], MD - (Orthopedic Surg) Patient presented to [facility] for MR shoulder Left w/o contrast.  11/11/21 [name], MD - (Orthopedic Surg) - Patient presented for Bursitis of left shoulder and other concerns. No medication changes.  10/20/21 Isabel Caprice, PT - Patient presented for Acute pain of left shoulder and other concerns. No medication changes.  10/12/21 [name], MD - (Orthopedic Surg) - Patient presented for Left shoulder pain unspecified  chronicity and other concerns. No medication changes.  09/21/21 Sherren Mocha, MD (Cardiology) - Patient presented for CAD involving native coronary artery of native heart without angina pectoris and other concerns. Prescribed Rosuvastatin Calcium 10 mg.  Hospital visits:  None in previous 6 months  Medications: Outpatient Encounter Medications as of 02/02/2022  Medication Sig   amLODipine (NORVASC) 5 MG tablet TAKE 1 TABLET BY MOUTH EVERY DAY   cholecalciferol (VITAMIN D3) 25 MCG (1000 UT) tablet Take 1,000 Units by mouth daily.   Eszopiclone 3 MG TABS Take 1 tablet (3 mg total) by mouth at bedtime. Take immediately before bedtime   irbesartan (AVAPRO) 300 MG tablet TAKE 1 TABLET BY MOUTH EVERY DAY   levothyroxine (SYNTHROID) 137 MCG tablet TAKE 1 TABLET BY MOUTH EVERY DAY BEFORE BREAKFAST   methocarbamol (ROBAXIN) 500 MG tablet Take 1 tablet (500 mg total) by mouth every 8 (eight) hours as needed for muscle spasms.   metoprolol succinate (TOPROL-XL) 50 MG 24 hr tablet TAKE 1 TABLET BY MOUTH EVERY DAY WITH OR IMMEDIATELY FOLLOWING A MEAL (Patient taking differently: Take 25 mg by mouth daily.)   pantoprazole (PROTONIX) 40 MG tablet Take 1 tablet (40 mg total) by mouth 2 (two) times daily before a meal.   rosuvastatin (CRESTOR) 10 MG tablet Take 10 mg by mouth 3 (three) times a week.   Semaglutide (RYBELSUS) 14 MG TABS Take 1 tablet (14 mg total) by mouth daily.   No facility-administered encounter medications on file as of 02/02/2022.   Reviewed chart prior to disease state call. Spoke with patient regarding BP  Recent Office Vitals: BP Readings from Last 3 Encounters:  12/28/21 (!) 144/84  12/16/21 128/80  12/07/21 118/70   Pulse Readings from Last 3 Encounters:  12/28/21 68  12/16/21 74  12/07/21 72  Wt Readings from Last 3 Encounters:  12/28/21 201 lb 4 oz (91.3 kg)  12/16/21 197 lb 6 oz (89.5 kg)  12/07/21 198 lb 3.2 oz (89.9 kg)     Kidney Function Lab Results   Component Value Date/Time   CREATININE 1.33 12/07/2021 11:51 AM   CREATININE 1.20 01/26/2021 08:48 AM   CREATININE 1.17 04/23/2020 09:14 AM   CREATININE 1.11 01/19/2020 09:53 AM   GFR 54.47 (L) 12/07/2021 11:51 AM   GFRNONAA 58 (L) 07/05/2017 10:24 AM   GFRAA 67 07/05/2017 10:24 AM       Latest Ref Rng & Units 12/07/2021   11:51 AM 01/26/2021    8:48 AM 07/20/2020    8:47 AM  BMP  Glucose 70 - 99 mg/dL 186  237  161   BUN 6 - 23 mg/dL '27  21  23   '$ Creatinine 0.40 - 1.50 mg/dL 1.33  1.20  1.07   Sodium 135 - 145 mEq/L 138  140  137   Potassium 3.5 - 5.1 mEq/L 3.7  4.1  4.2   Chloride 96 - 112 mEq/L 104  103  103   CO2 19 - 32 mEq/L '23  26  26   '$ Calcium 8.4 - 10.5 mg/dL 9.0  9.3  9.5     Current antihypertensive regimen:  amLODipine (NORVASC) 5 MG tablet irbesartan (AVAPRO) 300 MG tablet Unable to reach patient for assessment    Care Gaps: BP- 144/84 108/53 AWV- 12/22 CCM-need Lab Results  Component Value Date   HGBA1C 7.0 (H) 12/07/2021    Star Rating Drugs: Semaglutide (Rybelsus) - 7 mg Last filled 01/06/22 30 DS at [facility]   Irbesartan (Avapro) - 300 mg Last filled 01/09/22 30 DS at [facility]  Rosuvastatin 10 mg - Last filled 12/20/21 30 DS at [facility] Pharmacist Assistant [phone]

## 2022-02-03 ENCOUNTER — Ambulatory Visit (HOSPITAL_BASED_OUTPATIENT_CLINIC_OR_DEPARTMENT_OTHER)
Admission: RE | Admit: 2022-02-03 | Discharge: 2022-02-03 | Disposition: A | Discharge status: Home / Self Care | Payer: PPO | Source: Ambulatory Visit | Attending: Acute Care | Admitting: Acute Care

## 2022-02-03 DIAGNOSIS — I700 Atherosclerosis of aorta: Secondary | ICD-10-CM | POA: Insufficient documentation

## 2022-02-03 DIAGNOSIS — I2510 Atherosclerotic heart disease of native coronary artery without angina pectoris: Secondary | ICD-10-CM | POA: Diagnosis not present

## 2022-02-03 DIAGNOSIS — J439 Emphysema, unspecified: Secondary | ICD-10-CM | POA: Insufficient documentation

## 2022-02-03 DIAGNOSIS — Z87891 Personal history of nicotine dependence: Secondary | ICD-10-CM

## 2022-02-03 DIAGNOSIS — Z122 Encounter for screening for malignant neoplasm of respiratory organs: Secondary | ICD-10-CM | POA: Insufficient documentation

## 2022-02-06 ENCOUNTER — Other Ambulatory Visit: Payer: Self-pay | Admitting: Acute Care

## 2022-02-06 ENCOUNTER — Telehealth: Payer: Self-pay | Admitting: Family Medicine

## 2022-02-06 DIAGNOSIS — Z87891 Personal history of nicotine dependence: Secondary | ICD-10-CM

## 2022-02-06 DIAGNOSIS — Z122 Encounter for screening for malignant neoplasm of respiratory organs: Secondary | ICD-10-CM

## 2022-02-06 NOTE — Telephone Encounter (Signed)
Pt informed that since Dr Martinique is out of office this cannot be done as annual labs are different for different patients; this is not something another provider can do.  Pt verb understanding.

## 2022-02-06 NOTE — Telephone Encounter (Signed)
Patient wants orders to be place so he can come sometime this week to do his blood work for his CPE on 8/23. Patient would like to discuss the results in person with Dr.Jordan. I did let patient know that she was out of the office until next week but he is hoping someone else will place orders in her absence.     Please advise

## 2022-02-08 ENCOUNTER — Telehealth: Payer: Self-pay | Admitting: Acute Care

## 2022-02-08 NOTE — Telephone Encounter (Signed)
Reviewed results of LDCT. No suspicious findings for lung cancer. Atherosclerosis and emphysema as previously noted. Patient is on a statin medication.  Patient verbalized understanding of results. Will continue annual lung screening.

## 2022-02-13 ENCOUNTER — Encounter: Payer: PPO | Admitting: Gastroenterology

## 2022-02-14 NOTE — Progress Notes (Signed)
HPI: Jeff Lopez is a 71 y.o.male here today for his routine physical examination.  Last CPE: 02/02/21.  He walks about 2 times per week. He is trying to eat healthier, has decreased carbs intake and eating smaller portions.  Chronic medical problems: DM 2, OSA, GERD, hyperlipidemia, hypertension, seasonal allergies, CAD, and insomnia among some.  Immunization History  Administered Date(s) Administered   Fluad Quad(high Dose 65+) 04/08/2019, 03/09/2021   Influenza Split 03/16/2011, 04/12/2012   Influenza Whole 04/23/2010   Influenza, High Dose Seasonal PF 02/15/2015   Influenza,inj,Quad PF,6+ Mos 03/19/2014, 04/04/2018   Influenza-Unspecified 04/08/2013, 04/13/2019, 04/16/2020   PFIZER(Purple Top)SARS-COV-2 Vaccination 09/24/2019, 10/09/2019, 04/16/2020, 05/18/2021   Pneumococcal Conjugate-13 02/12/2014   Pneumococcal Polysaccharide-23 05/27/2010, 04/29/2018   Td 01/24/2006   Tdap 02/28/2018   Varicella 05/10/2012   Zoster Recombinat (Shingrix) 01/29/2017, 03/08/2017    Health Maintenance  Topic Date Due   INFLUENZA VACCINE  03/03/2022 (Originally 01/24/2022)   COVID-19 Vaccine (5 - Pfizer risk series) 03/03/2022 (Originally 07/13/2021)   HEMOGLOBIN A1C  06/08/2022   OPHTHALMOLOGY EXAM  06/29/2022   FOOT EXAM  12/17/2022   COLONOSCOPY (Pts 45-83yr Insurance coverage will need to be confirmed)  10/29/2026   TETANUS/TDAP  02/29/2028   Pneumonia Vaccine 70 Years old  Completed   Hepatitis C Screening  Completed   Zoster Vaccines- Shingrix  Completed   HPV VACCINES  Aged Out   Last prostate ca screening:  Nocturia every couple hours if he is awake, stable. Negative for other urinary symptoms.  Lab Results  Component Value Date   PSA 2.91 07/20/2020   PSA 1.49 06/05/2016   PSA 1.59 03/10/2015   -Concerns and/or follow up today:  C/O fatigue and sharp "needle" like sensation "all over" but mainly affecting feet. These problem have going on for a while. He  wonders if Metoprolol is causing any of these. CAD, follows with cardiologist. He is taking Crestor 10 mg 3 times per week, he has tolerated well. Lab Results  Component Value Date   CHOL 167 01/26/2021   HDL 39.80 01/26/2021   LDLCALC 74 07/20/2020   LDLDIRECT 99.0 01/26/2021   TRIG 226.0 (H) 01/26/2021   CHOLHDL 4 01/26/2021   HTN: He is on Metoprolol succinate 50 mg daily, Amlodipine 5 mg daily and Avapro 300 mg daily. He would like to decrease medications and maybe stop Metoprolol.  Lab Results  Component Value Date   CREATININE 1.33 12/07/2021   BUN 27 (H) 12/07/2021   NA 138 12/07/2021   K 3.7 12/07/2021   CL 104 12/07/2021   CO2 23 12/07/2021   2-3 weeks of diarrhea. He states that problem improved temporarily after stopping metformin.  She has been eating "a lot of" tomatoes, wonders if this is causing problem but then reports that he noted problem 1-2 days after Rybelsus dose was increased from 7 to 14 mg daily.  He has not tried OTC medications.  DM II on Rybelsus 14 mg daily. He has noted decreased appetite, has lost some wt since 12/2021.  Lab Results  Component Value Date   HGBA1C 7.0 (H) 12/07/2021   COPD : Has not had cough since smoking cessation. Lung cancer screening annually, last one 02/04/22.  Hypothyroidism on Levothyroxine 125 mcg daily. Lab Results  Component Value Date   TSH 3.01 12/07/2021   Review of Systems  Constitutional:  Positive for fatigue. Negative for activity change, appetite change and fever.  HENT:  Negative for mouth sores, nosebleeds and  sore throat.   Eyes:  Negative for redness and visual disturbance.  Respiratory:  Negative for cough, shortness of breath and wheezing.   Cardiovascular:  Negative for chest pain, palpitations and leg swelling.  Gastrointestinal:  Positive for diarrhea. Negative for abdominal pain, blood in stool, nausea and vomiting.  Endocrine: Negative for cold intolerance, heat intolerance, polydipsia,  polyphagia and polyuria.  Genitourinary:  Negative for decreased urine volume, dysuria, genital sores, hematuria and testicular pain.  Musculoskeletal:  Positive for arthralgias. Negative for gait problem and joint swelling.  Skin:  Negative for color change and rash.  Allergic/Immunologic: Positive for environmental allergies.  Neurological:  Negative for syncope, weakness and headaches.  Hematological:  Negative for adenopathy. Does not bruise/bleed easily.  Psychiatric/Behavioral:  Positive for sleep disturbance. Negative for confusion. The patient is not nervous/anxious.   All other systems reviewed and are negative.  Current Outpatient Medications on File Prior to Visit  Medication Sig Dispense Refill   cholecalciferol (VITAMIN D3) 25 MCG (1000 UT) tablet Take 1,000 Units by mouth daily.     Eszopiclone 3 MG TABS Take 1 tablet (3 mg total) by mouth at bedtime. Take immediately before bedtime 30 tablet 3   levothyroxine (SYNTHROID) 125 MCG tablet Take 125 mcg by mouth daily before breakfast.     metoprolol succinate (TOPROL-XL) 50 MG 24 hr tablet TAKE 1 TABLET BY MOUTH EVERY DAY WITH OR IMMEDIATELY FOLLOWING A MEAL (Patient taking differently: Take 25 mg by mouth daily.) 90 tablet 3   pantoprazole (PROTONIX) 40 MG tablet Take 1 tablet (40 mg total) by mouth 2 (two) times daily before a meal. 180 tablet 3   rosuvastatin (CRESTOR) 10 MG tablet Take 10 mg by mouth 3 (three) times a week.     No current facility-administered medications on file prior to visit.   Past Medical History:  Diagnosis Date   Allergy    per pt   CAD, NATIVE VESSEL 04/19/2010   nonobstructive by cath 2007:  oLAD 20-30%, mLAD 50%, pCFX 20-30%, oAVCFX 20-30%, L renal art 50%;  normal LVF   Cataract    bil cataracts removed   COPD (chronic obstructive pulmonary disease) ([place])    Diabetes mellitus without complication ([place])    GERD 04/09/2007   Headache(784.0)    HYPERLIPIDEMIA 04/09/2007   Patient denies.    HYPERTENSION, BENIGN 04/19/2010   Hypothyroidism    Hypothyroidism    previous hyperthyroidism, s/p I-131   LUMBAR DISC DISORDER 05/27/2010   Nerve damage    Left leg   OSTEOARTHRITIS, LUMBAR SPINE 04/09/2007   RENAL CYST 05/27/2010   Spinal headache    After having back surgery in 2014   Past Surgical History:  Procedure Laterality Date   BACK SURGERY  [id number]   CARDIAC CATHETERIZATION  2007   Dr Loanne Drilling   CHOLECYSTECTOMY     CHOLECYSTECTOMY N/A 07/13/2013   Procedure: LAPAROSCOPIC CHOLECYSTECTOMY WITH INTRAOPERATIVE CHOLANGIOGRAM;  Surgeon: Shann Medal, MD;  Location: [facility];  Service: General;  Laterality: N/A;   COLONOSCOPY  2018   COLONOSCOPY W/ POLYPECTOMY     ERCP N/A 07/14/2013   Procedure: ENDOSCOPIC RETROGRADE CHOLANGIOPANCREATOGRAPHY (ERCP);  Surgeon: [name], MD;  Location: [facility];  Service: Endoscopy;  Laterality: N/A;   EYE SURGERY Bilateral    Cataract removal   FOOT FRACTURE SURGERY     FRACTURE SURGERY     LUMBAR [place] SURGERY  08/06/2012   L3 & L4   LUMBAR DISC SURGERY  11/11/2015  L1 L2    DR NITKA   LUMBAR FUSION N/A 04/20/2015   Procedure: T12 to L1 fusion (Extension of Previous Fusion L2-S1 to T12-S1), Right Transforaminal lumbar interbody fusion, Posterior Fusion T12 to L1, with Pedicle screws, allograft, local bone graft, and  Vivigen;  Surgeon: [name], MD;  Location: [facility];  Service: Orthopedics;  Laterality: N/A;   LUMBAR LAMINECTOMY N/A 11/10/2013   Procedure: Left L1-2 far lateral approach to excise herniated nucleus pulposus;  Surgeon: [name], MD;  Location: [facility];  Service: Orthopedics;  Laterality: N/A;   LUMBAR LAMINECTOMY/DECOMPRESSION MICRODISCECTOMY Left 08/10/2012   Procedure: Dura Repair Left Side L2-L3;  Surgeon: [name], MD;  Location: [facility];  Service: Orthopedics;  Laterality: Left;  Wilson Frame, Sliding table, dura repair kit, microscope   NECK SURGERY     X 2   SINOSCOPY     SPINE SURGERY  2006    C-spine surgery x 2   UPPER GASTROINTESTINAL ENDOSCOPY     Allergies  Allergen Reactions   Ceftin Other (See Comments)    Patient stated it caused "sores in mouth" Thrush   Cefuroxime Axetil Other (See Comments)    Sores in mouth Sores in mouth Patient stated it caused "sores in mouth" Thrush other   Cephalexin Other (See Comments)    Other   Family History  Problem Relation Age of Onset   Breast cancer Mother    Uterine cancer Mother    Heart disease Mother    Heart attack Mother 69   Prostate cancer Father    Hypertension Brother    Non-Hodgkin's lymphoma Brother    Stomach cancer Paternal Grandmother    Thyroid disease Neg Hx    Colon cancer Neg Hx    Esophageal cancer Neg Hx    Rectal cancer Neg Hx    Pancreatic cancer Neg Hx    Colon polyps Neg Hx    Social History   Socioeconomic History   Marital status: Single    Spouse name: Not on file   Number of children: 1   Years of education: Not on file   Highest education level: Not on file  Occupational History   Occupation: DISTRIBUTION MGR    Employer: [employer]  Tobacco Use   Smoking status: Former    Packs/day: 1.00    Years: 37.00    Total pack years: 37.00    Types: Cigarettes    Quit date: 04/06/2008    Years since quitting: 13.8   Smokeless tobacco: Never   Tobacco comments:    pt has stopped smoking about 9 months now  Vaping Use   Vaping Use: Never used  Substance and Sexual Activity   Alcohol use: No   Drug use: No   Sexual activity: Not Currently  Other Topics Concern   Not on file  Social History Narrative   Works in Psychologist, educational. Retired.   Lives alone, has one child in [place].    Social Determinants of Health   Financial Resource Strain: Low Risk  (06/08/2021)   Overall Financial Resource Strain (CARDIA)    Difficulty of Paying Living Expenses: Not hard at all  Food Insecurity: No Food Insecurity (06/08/2021)   Hunger Vital Sign    Worried About Running Out of  Food in the Last Year: Never true    Ran Out of Food in the Last Year: Never true  Transportation Needs: Unknown (06/08/2021)   PRAPARE - Transportation    Lack of  Transportation (Medical): No    Lack of Transportation (Non-Medical): Not on file  Physical Activity: Insufficiently Active (06/08/2021)   Exercise Vital Sign    Days of Exercise per Week: 3 days    Minutes of Exercise per Session: 40 min  Stress: No Stress Concern Present (06/08/2021)   [place]    Feeling of Stress : Not at all  Social Connections: Socially Isolated (06/08/2021)   Social Connection and Isolation Panel [NHANES]    Frequency of Communication with Friends and Family: More than three times a week    Frequency of Social Gatherings with Friends and Family: More than three times a week    Attends Religious Services: Never    Marine scientist or Organizations: No    Attends Archivist Meetings: Never    Marital Status: Divorced   Vitals:   02/15/22 0949  BP: 128/80  Pulse: 81  Resp: 12  Temp: 97.8 F (36.6 C)  SpO2: 98%   Body mass index is 29.28 kg/m. Wt Readings from Last 3 Encounters:  02/15/22 194 lb (88 kg)  12/28/21 201 lb 4 oz (91.3 kg)  12/16/21 197 lb 6 oz (89.5 kg)   Physical Exam Vitals and nursing note reviewed.  Constitutional:      General: He is not in acute distress.    Appearance: He is well-developed.  HENT:     Head: Normocephalic and atraumatic.     Right Ear: Tympanic membrane, ear canal and external ear normal.     Left Ear: Tympanic membrane, ear canal and external ear normal.     Mouth/Throat:     Mouth: Mucous membranes are moist.     Pharynx: Oropharynx is clear.  Eyes:     Conjunctiva/sclera: Conjunctivae normal.     Pupils: Pupils are equal, round, and reactive to light.  Neck:     Thyroid: No thyromegaly.     Trachea: No tracheal deviation.  Cardiovascular:     Rate and Rhythm:  Normal rate and regular rhythm.     Pulses:          Dorsalis pedis pulses are 2+ on the right side and 2+ on the left side.     Heart sounds: No murmur heard. Pulmonary:     Effort: Pulmonary effort is normal. No respiratory distress.     Breath sounds: Normal breath sounds.  Abdominal:     Palpations: Abdomen is soft. There is no hepatomegaly or mass.     Tenderness: There is no abdominal tenderness.  Genitourinary:    Comments: No concerns. Musculoskeletal:        General: No tenderness.     Cervical back: Normal range of motion.     Comments: No signs of synovitis.  Lymphadenopathy:     Cervical: No cervical adenopathy.  Skin:    General: Skin is warm.     Findings: No erythema.  Neurological:     General: No focal deficit present.     Mental Status: He is alert and oriented to person, place, and time.     Cranial Nerves: No cranial nerve deficit.     Sensory: No sensory deficit.     Gait: Gait normal.     Deep Tendon Reflexes:     Reflex Scores:      Bicep reflexes are 2+ on the right side and 2+ on the left side.      Patellar reflexes are 2+ on  the right side and 2+ on the left side. Psychiatric:        Mood and Affect: Mood and affect normal.   ASSESSMENT AND PLAN:  JeffRiyaan was seen today for annual exam.  Diagnoses and all orders for this visit: Orders Placed This Encounter  Procedures   Lipid panel   PSA(Must document that pt has been informed of limitations of PSA testing.)   Urinalysis, Routine w reflex microscopic   VAS [place] AAA DUPLEX   Lab Results  Component Value Date   PSA 3.65 02/15/2022   Lab Results  Component Value Date   CHOL 104 02/15/2022   HDL 36.10 (L) 02/15/2022   LDLCALC 39 02/15/2022   LDLDIRECT 99.0 01/26/2021   TRIG 146.0 02/15/2022   CHOLHDL 3 02/15/2022   Routine general medical examination at a health care facility We discussed the importance of regular physical activity and healthy diet for prevention of chronic illness  and/or complications. Preventive guidelines reviewed. Vaccination up to date. Next CPE in a year.  Type II diabetes mellitus with peripheral autonomic neuropathy (HCC) Last HgA1C in 11/2021. He can try decreasing dose of Rybelsus to 1/2 tab and see if diarrhea improves. F/U in 3 months.  -     Semaglutide (RYBELSUS) 14 MG TABS; Take 1 tablet (14 mg total) by mouth daily.  Hypothyroidism following radioiodine therapy Problem is well controlled. No changes in current management.  Prostate cancer screening -     PSA(Must document that pt has been informed of limitations of PSA testing.)  Urinary frequency Chronic and stable. We discussed possible etiologies. Further recommendations according to PSA and UA.  Screening for abdominal aortic aneurysm -     VAS [place] AAA DUPLEX; Future  HYPERTENSION, BENIGN He would like to take less pills.Explained that if he discontinues medications BP will go up. We discussed complications of elevated BP. Metoprolol can be causing fatigue, so recommend trying to decrease Metoprolol succinate from 50 mg to 25 mg (1/2 tab). Amlodipine-Olmesartan 5-40 mg daily and stop amlodipine tab and Avapro 300 mg. Monitor BP at home regularly.  -     amLODipine-olmesartan (AZOR) 5-40 MG tablet; Take 1 tablet by mouth daily.  Chronic obstructive pulmonary disease, unspecified COPD type ([place]) Asymptomatic since he quit smoking a few years ago.  Hyperlipidemia associated with type 2 diabetes mellitus ([place]) He is tolerating statin well but does not want to take it daily, would like to stop it. We discussed CV benefits of statins. Continue Rosuvastatin 10 mg 3 times per week.  Diarrhea, unspecified type Not clear about duration. He was seen by his GI in 12/2021, did not seem to have diarrhea at that time. He thinks it may be caused by some dietary habits and mediations. Colonoscopy 10/29/2019. Metoprolol decreased today. He can try decreasing tomatoes intake and  dose of Rybelsus. One change at the time. For now we can hold on further work up.  Return in 3 months (on 05/18/2022) for DM II and HTN.  Zionah Criswell G. Martinique, MD  [facility]. [place] office.

## 2022-02-15 ENCOUNTER — Ambulatory Visit (INDEPENDENT_AMBULATORY_CARE_PROVIDER_SITE_OTHER): Payer: PPO | Admitting: Family Medicine

## 2022-02-15 ENCOUNTER — Encounter: Payer: Self-pay | Admitting: Family Medicine

## 2022-02-15 VITALS — BP 128/80 | HR 81 | Temp 97.8°F | Resp 12 | Ht 68.25 in | Wt 194.0 lb

## 2022-02-15 DIAGNOSIS — E1143 Type 2 diabetes mellitus with diabetic autonomic (poly)neuropathy: Secondary | ICD-10-CM | POA: Diagnosis not present

## 2022-02-15 DIAGNOSIS — E1169 Type 2 diabetes mellitus with other specified complication: Secondary | ICD-10-CM | POA: Diagnosis not present

## 2022-02-15 DIAGNOSIS — Z136 Encounter for screening for cardiovascular disorders: Secondary | ICD-10-CM | POA: Diagnosis not present

## 2022-02-15 DIAGNOSIS — R197 Diarrhea, unspecified: Secondary | ICD-10-CM

## 2022-02-15 DIAGNOSIS — J449 Chronic obstructive pulmonary disease, unspecified: Secondary | ICD-10-CM | POA: Diagnosis not present

## 2022-02-15 DIAGNOSIS — Z0000 Encounter for general adult medical examination without abnormal findings: Secondary | ICD-10-CM | POA: Diagnosis not present

## 2022-02-15 DIAGNOSIS — Z125 Encounter for screening for malignant neoplasm of prostate: Secondary | ICD-10-CM | POA: Diagnosis not present

## 2022-02-15 DIAGNOSIS — R350 Frequency of micturition: Secondary | ICD-10-CM

## 2022-02-15 DIAGNOSIS — I10 Essential (primary) hypertension: Secondary | ICD-10-CM | POA: Diagnosis not present

## 2022-02-15 DIAGNOSIS — E785 Hyperlipidemia, unspecified: Secondary | ICD-10-CM

## 2022-02-15 DIAGNOSIS — E890 Postprocedural hypothyroidism: Secondary | ICD-10-CM

## 2022-02-15 LAB — URINALYSIS, ROUTINE W REFLEX MICROSCOPIC
Bilirubin Urine: NEGATIVE
Hgb urine dipstick: NEGATIVE
Ketones, ur: NEGATIVE
Leukocytes,Ua: NEGATIVE
Nitrite: NEGATIVE
RBC / HPF: NONE SEEN (ref 0–?)
Specific Gravity, Urine: 1.025 (ref 1.000–1.030)
Urine Glucose: NEGATIVE
Urobilinogen, UA: 0.2 (ref 0.0–1.0)
pH: 5.5 (ref 5.0–8.0)

## 2022-02-15 LAB — LIPID PANEL
Cholesterol: 104 mg/dL (ref 0–200)
HDL: 36.1 mg/dL — ABNORMAL LOW (ref >39.00)
LDL Cholesterol: 39 mg/dL (ref 0–99)
NonHDL: 68.07
Total CHOL/HDL Ratio: 3
Triglycerides: 146 mg/dL (ref 0.0–149.0)
VLDL: 29.2 mg/dL (ref 0.0–40.0)

## 2022-02-15 LAB — PSA: PSA: 3.65 ng/mL (ref 0.10–4.00)

## 2022-02-15 MED ORDER — RYBELSUS 14 MG PO TABS: 14.0000 mg | ORAL_TABLET | Freq: Every day | ORAL | 3 refills | Status: DC

## 2022-02-15 MED ORDER — AMLODIPINE-OLMESARTAN 5-40 MG PO TABS: 1.0000 | ORAL_TABLET | Freq: Every day | ORAL | 0 refills | Status: DC

## 2022-02-15 NOTE — Patient Instructions (Addendum)
A few things to remember from today's visit:  Preventive Care 65 Years and Older, Male Preventive care refers to lifestyle choices and visits with your health care provider that can promote health and wellness. Preventive care visits are also called wellness exams. What can I expect for my preventive care visit? Counseling During your preventive care visit, your health care provider may ask about your: Medical history, including: Past medical problems. Family medical history. History of falls. Current health, including: Emotional well-being. Home life and relationship well-being. Sexual activity. Memory and ability to understand (cognition). Lifestyle, including: Alcohol, nicotine or tobacco, and drug use. Access to firearms. Diet, exercise, and sleep habits. Work and work Statistician. Sunscreen use. Safety issues such as seatbelt and bike helmet use. Physical exam Your health care provider will check your: Height and weight. These may be used to calculate your BMI (body mass index). BMI is a measurement that tells if you are at a healthy weight. Waist circumference. This measures the distance around your waistline. This measurement also tells if you are at a healthy weight and may help predict your risk of certain diseases, such as type 2 diabetes and high blood pressure. Heart rate and blood pressure. Body temperature. Skin for abnormal spots. What immunizations do I need?  Vaccines are usually given at various ages, according to a schedule. Your health care provider will recommend vaccines for you based on your age, medical history, and lifestyle or other factors, such as travel or where you work. What tests do I need? Screening Your health care provider may recommend screening tests for certain conditions. This may include: Lipid and cholesterol levels. Diabetes screening. This is done by checking your blood sugar (glucose) after you have not eaten for a while  (fasting). Hepatitis C test. Hepatitis B test. HIV (human immunodeficiency virus) test. STI (sexually transmitted infection) testing, if you are at risk. Lung cancer screening. Colorectal cancer screening. Prostate cancer screening. Abdominal aortic aneurysm (AAA) screening. You may need this if you are a current or former smoker. Talk with your health care provider about your test results, treatment options, and if necessary, the need for more tests. Follow these instructions at home: Eating and drinking  Eat a diet that includes fresh fruits and vegetables, whole grains, lean protein, and low-fat dairy products. Limit your intake of foods with high amounts of sugar, saturated fats, and salt. Take vitamin and mineral supplements as recommended by your health care provider. Do not drink alcohol if your health care provider tells you not to drink. If you drink alcohol: Limit how much you have to 0-2 drinks a day. Know how much alcohol is in your drink. In the [place] one drink equals one 12 oz bottle of beer (355 mL), one 5 oz glass of wine (148 mL), or one 1 oz glass of hard liquor (44 mL). Lifestyle Brush your teeth every morning and night with fluoride toothpaste. Floss one time each day. Exercise for at least 30 minutes 5 or more days each week. Do not use any products that contain nicotine or tobacco. These products include cigarettes, chewing tobacco, and vaping devices, such as e-cigarettes. If you need help quitting, ask your health care provider. Do not use drugs. If you are sexually active, practice safe sex. Use a condom or other form of protection to prevent STIs. Take aspirin only as told by your health care provider. Make sure that you understand how much to take and what form to take. Work with your health  care provider to find out whether it is safe and beneficial for you to take aspirin daily. Ask your health care provider if you need to take a cholesterol-lowering medicine  (statin). Find healthy ways to manage stress, such as: Meditation, yoga, or listening to music. Journaling. Talking to a trusted person. Spending time with friends and family. Safety Always wear your seat belt while driving or riding in a vehicle. Do not drive: If you have been drinking alcohol. Do not ride with someone who has been drinking. When you are tired or distracted. While texting. If you have been using any mind-altering substances or drugs. Wear a helmet and other protective equipment during sports activities. If you have firearms in your house, make sure you follow all gun safety procedures. Minimize exposure to UV radiation to reduce your risk of skin cancer. What's next? Visit your health care provider once a year for an annual wellness visit. Ask your health care provider how often you should have your eyes and teeth checked. Stay up to date on all vaccines. This information is not intended to replace advice given to you by your health care provider. Make sure you discuss any questions you have with your health care provider. Document Revised: 12/08/2020 Document Reviewed: 12/08/2020 Elsevier Patient Education  [place].  Routine general medical examination at a health care facility  Type II diabetes mellitus with peripheral autonomic neuropathy ([place])  Hypothyroidism following radioiodine therapy  Hyperlipidemia associated with type 2 diabetes mellitus ([place]) - Plan: Lipid panel  Prostate cancer screening - Plan: PSA(Must document that pt has been informed of limitations of PSA testing.)  Urinary frequency - Plan: PSA(Must document that pt has been informed of limitations of PSA testing.), Urinalysis, Routine w reflex microscopic  Screening for abdominal aortic aneurysm - Plan: VAS [place] AAA DUPLEX  HYPERTENSION, BENIGN - Plan: amLODipine-olmesartan (AZOR) 5-40 MG tablet  If you need refills please call your pharmacy. Decrease Metoprolol to 1/2 tab and  monitor if fatigue improves. Decrease tomatoes intake and monitor for changes in diarrhea, if not better decreasing Rybelsus. Amlodipine and Irbesartan changed to Azor (Amlodipine-Olmesartan).  Do not use My Chart to request refills or for acute issues that need immediate attention.   Please be sure medication list is accurate. If a new problem present, please set up appointment sooner than planned today.

## 2022-02-15 NOTE — Assessment & Plan Note (Addendum)
He is tolerating statin well but does not want to take it daily, would like to stop it. We discussed CV benefits of statins. Continue Rosuvastatin 10 mg 3 times per week.

## 2022-02-21 ENCOUNTER — Other Ambulatory Visit: Payer: Self-pay

## 2022-02-21 ENCOUNTER — Telehealth: Payer: Self-pay | Admitting: Family Medicine

## 2022-02-21 DIAGNOSIS — E1143 Type 2 diabetes mellitus with diabetic autonomic (poly)neuropathy: Secondary | ICD-10-CM

## 2022-02-21 NOTE — Telephone Encounter (Signed)
Informed patient of results and patient verbalized understanding.  

## 2022-02-21 NOTE — Telephone Encounter (Signed)
Pt walked in upset about not receiving his results from his cpe. Informed pt that Dr. Martinique just returned to the office and that his results were just reviewed. Pt was given a print out of his results and pt stated that was okay however he doesn't understand it and if the nurse can give him a call to review it.   Pt stated he didn't have this problem with Dr. Ethlyn Gallery. And would like a call to review the results.   Please advise.

## 2022-02-24 ENCOUNTER — Ambulatory Visit (HOSPITAL_COMMUNITY)
Admission: RE | Admit: 2022-02-24 | Discharge: 2022-02-24 | Disposition: A | Discharge status: Home / Self Care | Payer: PPO | Source: Ambulatory Visit | Attending: Family Medicine | Admitting: Family Medicine

## 2022-02-24 ENCOUNTER — Ambulatory Visit: Payer: PPO | Admitting: Orthopedic Surgery

## 2022-02-24 DIAGNOSIS — Z136 Encounter for screening for cardiovascular disorders: Secondary | ICD-10-CM | POA: Diagnosis not present

## 2022-02-24 DIAGNOSIS — I77811 Abdominal aortic ectasia: Secondary | ICD-10-CM | POA: Diagnosis not present

## 2022-02-24 DIAGNOSIS — Z87891 Personal history of nicotine dependence: Secondary | ICD-10-CM | POA: Insufficient documentation

## 2022-03-13 ENCOUNTER — Other Ambulatory Visit: Payer: Self-pay | Admitting: Family

## 2022-03-13 DIAGNOSIS — E1143 Type 2 diabetes mellitus with diabetic autonomic (poly)neuropathy: Secondary | ICD-10-CM

## 2022-03-28 ENCOUNTER — Ambulatory Visit (INDEPENDENT_AMBULATORY_CARE_PROVIDER_SITE_OTHER): Payer: PPO

## 2022-03-28 ENCOUNTER — Encounter: Payer: Self-pay | Admitting: Sports Medicine

## 2022-03-28 ENCOUNTER — Ambulatory Visit (INDEPENDENT_AMBULATORY_CARE_PROVIDER_SITE_OTHER): Payer: PPO | Admitting: Sports Medicine

## 2022-03-28 DIAGNOSIS — M79605 Pain in left leg: Secondary | ICD-10-CM | POA: Diagnosis not present

## 2022-03-28 DIAGNOSIS — R252 Cramp and spasm: Secondary | ICD-10-CM | POA: Diagnosis not present

## 2022-03-28 DIAGNOSIS — M79662 Pain in left lower leg: Secondary | ICD-10-CM

## 2022-03-28 NOTE — Progress Notes (Signed)
Left calf pain; 2-3 months  @ night gets cramps severe enough that he has to get up and stretch it out  "Feels tight; rubber band"  Used to be able to walk several miles; but now is limited   No known injury Has tried ice/heat with no relief

## 2022-03-28 NOTE — Progress Notes (Signed)
Jeff Lopez - 71 y.o. male MRN [id number]  Date of birth: 06/30/1950  Office Visit Note: Visit Date: 03/28/2022 PCP: Martinique, Betty G, MD Referred by: Martinique, Betty G, MD  Subjective: Chief Complaint  Patient presents with   Left Leg - Pain   HPI: Jeff Lopez is a pleasant 71 y.o. male who presents today for left calf pain.  He has had this pain for the last several months.  He likes to be very active and usually walks about 3 miles a day, however the calf is now limiting that motion.  Years ago he did have a procedure with a midfoot fusion and does have 3 screws within the left midfoot.  He denies any injury to the calf that he knows of.  Feels like it feels tight.  He denies any swelling, redness or bruising.  Denies any radicular pain or numbness tingling.  He will get pain with activity, also reports cramping at nighttime that will wake him up.  Once he steps down at night and puts pressure on the calf the cramping will go away.  He denies any sensation of restless leg or needing to shake out the leg.  He does have over-the-counter orthotics that are starting to breakdown.  No chest pain, shortness of breath, asymmetric leg swelling.  Pertinent ROS were reviewed with the patient and found to be negative unless otherwise specified above in HPI.   Assessment & Plan: Visit Diagnoses:  1. Pain in left leg   2. Pain of left calf   3. Leg cramping    Plan: Discussed with Julen the possible etiology of his calf pain and cramping.  He does have a fascial restriction near the distal calf just proximal to the myotendinous junction.  We did a trial of extracorporeal shockwave therapy to this region, he will see over the next few days how he responds to this treatment.  He does have a fairly good breakdown of both the longitudinal and transverse arch of the foot.  I did place him in a metatarsal pad today.  He may need to get new or orthotics made for foot support.  I would like to  see how he responds to the shockwave therapy, I did recommend he apply a Compression sleeve or a long compressive sock at nighttime to help with the cramping, as this seems somewhat indicative of neurogenic cramping.  He has been increasing his water intake without relief.  We will see him back in the next 1-2 weeks and consider repeating shockwave therapy.  If this continues, we may want to ultrasound the calf, always possible to obtain ABIs to rule out any claudication as well.  Eventually we will get him into some calf and posterior tibial exercises/PT but I would like to see how he responds to treatment first.  Follow-up: Return for 1-2 weeks with DR. Remi Lopata.   Meds & Orders: No orders of the defined types were placed in this encounter.   Orders Placed This Encounter  Procedures   XR Tibia/Fibula Left     Procedures: Procedure: ECSWT Indications:  Calf pain, fascial restriction   Procedure Details Consent: Risks of procedure as well as the alternatives and risks of each were explained to the patient.  Verbal consent for procedure obtained. Time Out: Verified patient identification, verified procedure, site was marked, verified correct patient position. The area was cleaned with alcohol swab.     The left calf and myotendinous junction was targeted for Extracorporeal  shockwave therapy.    Preset: Myofascial trigger point Power Level: 90 mJ  Frequency: 10 Hz Impulse/cycles: 2500 Head size: Regular   Patient tolerated procedure well without immediate complications.         Clinical History: No specialty comments available.  He reports that he quit smoking about 13 years ago. His smoking use included cigarettes. He has a 37.00 pack-year smoking history. He has never used smokeless tobacco.  Recent Labs    06/08/21 0934 12/07/21 1151  HGBA1C 6.8* 7.0*    Objective:    Physical Exam  Gen: Well-appearing, in no acute distress; non-toxic CV: Regular Rate. Well-perfused.  Warm.  Resp: Breathing unlabored on room air; no wheezing. Psych: Fluid speech in conversation; appropriate affect; normal thought process Neuro: Sensation intact throughout. No gross coordination deficits.   Ortho Exam - Left leg: Circumference of both calves are equivocal.  There is no overlying redness, erythema or swelling.  Palpation near the myotendinous junction of the calf musculature as it extends into the proximal Achilles tendon feels a slight fascial or thickening.  Deep palpation in this area does reproduce pain.  Full range of motion about the knee and ankle of each foot.  He has weakness with posterior tibial firing going up onto the toes of the left leg compared to the right.  No tenderness to palpation palpating down the Achilles tendon.  Negative Homans' sign.  Patient does have bilateral pes planus with overpronation.  There is splaying between toes and 3 of the left foot with a loss of both longitudinal and transverse arch.  Imaging: XR Tibia/Fibula Left  Result Date: 03/28/2022 2 extended views of the tibia and fibula of the left leg were ordered and reviewed by myself.  X-rays demonstrate no acute osseous abnormality or fracture.  There is some very small calcification within the posterior aspect of the knee on lateral view.  Small plantar calcaneal spur and 3 surgical screws within the midfoot.   Past Medical/Family/Surgical/Social History: Medications & Allergies reviewed per EMR, new medications updated. Patient Active Problem List   Diagnosis Date Noted   Insomnia due to medical condition 01/24/2022   Pruritus 08/24/2020   OSA (obstructive sleep apnea) 11/25/2018   Non-restorative sleep 11/25/2018   Malignant neoplasm of ascending colon ([place]) 05/16/2018   Low testosterone 03/02/2018   Gynecomastia 02/28/2018   Left lower quadrant pain 12/26/2017   Arthralgia 10/03/2017   Numbness 10/03/2017   Chronic pansinusitis 01/09/2017   Deviated septum 01/09/2017    Chronic left sacroiliac pain 10/19/2016   Chronic left-sided low back pain with left-sided sciatica 07/31/2016   Spinal stenosis, lumbar region, with neurogenic claudication 04/21/2015    Class: Chronic   Spondylolisthesis of lumbar region 04/21/2015    Class: Chronic   Hypothyroidism following radioiodine therapy 03/10/2015   Screening for prostate cancer 03/10/2015   Hemochromatosis 03/27/2014   Choledocholithiasis 07/14/2013   Nonspecific (abnormal) findings on radiological and other examination of biliary tract 07/13/2013   Calculus of gallbladder with acute cholecystitis, without mention of obstruction 07/13/2013   Calculus of bile duct without mention of cholecystitis or obstruction 07/13/2013   Abdominal pain 07/12/2013   Neck pain on left side 05/30/2013   Quit smoking 11/28/2012   Postoperative CSF leak 08/10/2012    Class: Acute   Degenerative disc disease, lumbar 08/06/2012    Class: Chronic   HNP (herniated nucleus pulposus), lumbar 08/06/2012    Class: Acute   Type II diabetes mellitus with peripheral autonomic neuropathy ([place]) 06/10/2012  COPD GOLD 0 02/09/2012   Chest pain at rest 02/06/2012   Mixed allergic and non-allergic rhinitis 02/06/2012   Radiculopathy of leg 11/05/2010   RENAL CYST 05/27/2010   LUMBAR [place] DISORDER 05/27/2010   HYPERTENSION, BENIGN 04/19/2010   CAD, NATIVE VESSEL 04/19/2010   Hyperlipidemia associated with type 2 diabetes mellitus ([place]) 04/09/2007   GERD 04/09/2007   OSTEOARTHRITIS, LUMBAR SPINE 04/09/2007   Past Medical History:  Diagnosis Date   Allergy    per pt   CAD, NATIVE VESSEL 04/19/2010   nonobstructive by cath 2007:  oLAD 20-30%, mLAD 50%, pCFX 20-30%, oAVCFX 20-30%, L renal art 50%;  normal LVF   Cataract    bil cataracts removed   COPD (chronic obstructive pulmonary disease) ([place])    Diabetes mellitus without complication ([place])    GERD 04/09/2007   Headache(784.0)    HYPERLIPIDEMIA 04/09/2007   Patient denies.    HYPERTENSION, BENIGN 04/19/2010   Hypothyroidism    Hypothyroidism    previous hyperthyroidism, s/p I-131   LUMBAR DISC DISORDER 05/27/2010   Nerve damage    Left leg   OSTEOARTHRITIS, LUMBAR SPINE 04/09/2007   RENAL CYST 05/27/2010   Spinal headache    After having back surgery in 2014   Family History  Problem Relation Age of Onset   Breast cancer Mother    Uterine cancer Mother    Heart disease Mother    Heart attack Mother 30   Prostate cancer Father    Hypertension Brother    Non-Hodgkin's lymphoma Brother    Stomach cancer Paternal Grandmother    Thyroid disease Neg Hx    Colon cancer Neg Hx    Esophageal cancer Neg Hx    Rectal cancer Neg Hx    Pancreatic cancer Neg Hx    Colon polyps Neg Hx    Past Surgical History:  Procedure Laterality Date   BACK SURGERY  2012,2014   CARDIAC CATHETERIZATION  2007   Dr Loanne Drilling   CHOLECYSTECTOMY     CHOLECYSTECTOMY N/A 07/13/2013   Procedure: LAPAROSCOPIC CHOLECYSTECTOMY WITH INTRAOPERATIVE CHOLANGIOGRAM;  Surgeon: Shann Medal, MD;  Location: [facility];  Service: General;  Laterality: N/A;   COLONOSCOPY  2018   COLONOSCOPY W/ POLYPECTOMY     ERCP N/A 07/14/2013   Procedure: ENDOSCOPIC RETROGRADE CHOLANGIOPANCREATOGRAPHY (ERCP);  Surgeon: [name], MD;  Location: [facility];  Service: Endoscopy;  Laterality: N/A;   EYE SURGERY Bilateral    Cataract removal   FOOT FRACTURE SURGERY     FRACTURE SURGERY     LUMBAR [place] SURGERY  08/06/2012   L3 & L4   LUMBAR DISC SURGERY  11/11/2015   L1 L2    DR NITKA   LUMBAR FUSION N/A 04/20/2015   Procedure: T12 to L1 fusion (Extension of Previous Fusion L2-S1 to T12-S1), Right Transforaminal lumbar interbody fusion, Posterior Fusion T12 to L1, with Pedicle screws, allograft, local bone graft, and  Vivigen;  Surgeon: [name], MD;  Location: [facility];  Service: Orthopedics;  Laterality: N/A;   LUMBAR LAMINECTOMY N/A 11/10/2013   Procedure: Left L1-2 far lateral approach to excise  herniated nucleus pulposus;  Surgeon: [name], MD;  Location: [facility];  Service: Orthopedics;  Laterality: N/A;   LUMBAR LAMINECTOMY/DECOMPRESSION MICRODISCECTOMY Left 08/10/2012   Procedure: Dura Repair Left Side L2-L3;  Surgeon: [name], MD;  Location: [facility];  Service: Orthopedics;  Laterality: Left;  Wilson Frame, Sliding table, dura repair kit, microscope   NECK SURGERY  X 2   SINOSCOPY     SPINE SURGERY  2006   C-spine surgery x 2   UPPER GASTROINTESTINAL ENDOSCOPY     Social History   Occupational History   Occupation: DISTRIBUTION MGR    Employer: [employer]  Tobacco Use   Smoking status: Former    Packs/day: 1.00    Years: 37.00    Total pack years: 37.00    Types: Cigarettes    Quit date: 04/06/2008    Years since quitting: 13.9   Smokeless tobacco: Never   Tobacco comments:    pt has stopped smoking about 9 months now  Vaping Use   Vaping Use: Never used  Substance and Sexual Activity   Alcohol use: No   Drug use: No   Sexual activity: Not Currently

## 2022-03-31 ENCOUNTER — Encounter (HOSPITAL_BASED_OUTPATIENT_CLINIC_OR_DEPARTMENT_OTHER): Payer: Self-pay | Admitting: Family Medicine

## 2022-03-31 ENCOUNTER — Ambulatory Visit (INDEPENDENT_AMBULATORY_CARE_PROVIDER_SITE_OTHER): Payer: PPO | Admitting: Family Medicine

## 2022-03-31 DIAGNOSIS — I739 Peripheral vascular disease, unspecified: Secondary | ICD-10-CM | POA: Diagnosis not present

## 2022-03-31 DIAGNOSIS — E890 Postprocedural hypothyroidism: Secondary | ICD-10-CM

## 2022-03-31 DIAGNOSIS — I10 Essential (primary) hypertension: Secondary | ICD-10-CM | POA: Diagnosis not present

## 2022-03-31 DIAGNOSIS — E1143 Type 2 diabetes mellitus with diabetic autonomic (poly)neuropathy: Secondary | ICD-10-CM

## 2022-03-31 NOTE — Assessment & Plan Note (Signed)
TSH has been at goal with no dosage change today We will TSH just prior to next appointment

## 2022-03-31 NOTE — Patient Instructions (Signed)
  Medication Instructions:  Your physician recommends that you continue on your current medications as directed. Please refer to the Current Medication list given to you today. --If you need a refill on any your medications before your next appointment, please call your pharmacy first. If no refills are authorized on file call the office.-- Lab Work: Your physician has recommended that you have lab work today: No If you have labs (blood work) drawn today and your tests are completely normal, you will receive your results via [facility] a phone call from our staff.  Please ensure you check your voicemail in the event that you authorized detailed messages to be left on a delegated number. If you have any lab test that is abnormal or we need to change your treatment, we will call you to review the results.  Referrals/Procedures/Imaging: No  Follow-Up: Your next appointment:   Your physician recommends that you schedule a follow-up appointment in: 3 months cpe with Dr. de [place]. Nurse visit labs 1 week before.   You will receive a text message or e-mail with a link to a survey about your care and experience with [place] today! We would greatly appreciate your feedback!   Thanks for letting us be apart of your health journey!!  Primary Care and Sports Medicine   Dr. Arlina Robes [place]   We encourage you to activate your patient portal called "MyChart".  Sign up information is provided on this After Visit Summary.  MyChart is used to connect with patients for Virtual Visits (Telemedicine).  Patients are able to view lab/test results, encounter notes, upcoming appointments, etc.  Non-urgent messages can be sent to your provider as well. To learn more about what you can do with MyChart, please visit --  [email or web address].

## 2022-03-31 NOTE — Assessment & Plan Note (Signed)
Most recent hemoglobin A1c was at goal, this was a few months ago Recommend continue with current medic patients, which look to make adjustments to medication regimen if continuing to have side effects from medication Plan to recheck hemoglobin A1c just prior to next appointment

## 2022-03-31 NOTE — Assessment & Plan Note (Signed)
History seems most consistent with vascular claudication as most likely etiology of left calf pain.  Given lack of improvement with treatments directed at musculoskeletal source, we will refer to vascular specialist for further evaluation

## 2022-03-31 NOTE — Progress Notes (Signed)
New Patient Office Visit  Subjective    Patient ID: Jeff Lopez, male    DOB: 1951/04/06  Age: 71 y.o. MRN: [id number]  CC:  Chief Complaint  Patient presents with   New Patient (Initial Visit)    Pt here to establish new care     HPI Jeff Lopez presents to establish care Last PCP - had one for several years, that provider left. Established with another locally, but not sure if great fit for him.  HTN: Current medications include amlodipine-olmesartan as below.  Denies any issues with chest pain or headaches at this time.  HLD: In general, recent labs have been at goal with total cholesterol and LDL in range.  He was prescribed rosuvastatin in the past, particular related to heart attack/stroke risk.  He reveals today that he never began taking medication as he felt he did not need it due to his cholesterol numbers.  He is aware that part of the reason he was prescribed medication was to lower risk of heart attack and stroke  DM: Current medications include semaglutide orally.  He does feel that he has some GI side effects related to this medication, notably more frequent bowel movements.  Most recent hemoglobin A1c was at goal at 7.0%  AAA screen: Did have ultrasound to screen for abdominal aortic aneurysm given his smoking history.  He was informed by prior PCP of results, however did receive a letter in the mail was unsure of what the findings indicated.  Reviewed findings today, no evidence of AAA with all diameters under 3 cm  Claudication: He indicates that he has been having some left calf tightness.  He did see orthopedic specialist recently with indication that symptoms may be coming from musculoskeletal etiology.  He did have treatment in the office which he does not feel was very beneficial.  Symptoms tend to be most noticeable with walking over longer distances, improved with rest.  Hypothyroidism: Manages with use of levothyroxine, denies any issues with  medication, recent TSH labs have been at goal  Outpatient Encounter Medications as of 03/31/2022  Medication Sig   amLODipine-olmesartan (AZOR) 5-40 MG tablet Take 1 tablet by mouth daily.   cholecalciferol (VITAMIN D3) 25 MCG (1000 UT) tablet Take 1,000 Units by mouth daily.   clobetasol (TEMOVATE) 0.05 % external solution Apply topically.   Eszopiclone 3 MG TABS Take 1 tablet (3 mg total) by mouth at bedtime. Take immediately before bedtime   fluticasone (FLONASE) 50 MCG/ACT nasal spray Place 2 sprays into both nostrils daily.   levothyroxine (SYNTHROID) 125 MCG tablet Take 125 mcg by mouth daily before breakfast.   metoprolol succinate (TOPROL-XL) 50 MG 24 hr tablet TAKE 1 TABLET BY MOUTH EVERY DAY WITH OR IMMEDIATELY FOLLOWING A MEAL (Patient taking differently: Take 25 mg by mouth daily.)   pantoprazole (PROTONIX) 40 MG tablet Take 1 tablet (40 mg total) by mouth 2 (two) times daily before a meal.   rosuvastatin (CRESTOR) 10 MG tablet Take 10 mg by mouth 3 (three) times a week.   Semaglutide (RYBELSUS) 14 MG TABS Take 1 tablet (14 mg total) by mouth daily.   No facility-administered encounter medications on file as of 03/31/2022.    Past Medical History:  Diagnosis Date   Allergy    per pt   CAD, NATIVE VESSEL 04/19/2010   nonobstructive by cath 2007:  oLAD 20-30%, mLAD 50%, pCFX 20-30%, oAVCFX 20-30%, L renal art 50%;  normal LVF   Cataract  bil cataracts removed   COPD (chronic obstructive pulmonary disease) ([place])    Diabetes mellitus without complication ([place])    GERD 04/09/2007   Headache(784.0)    HYPERLIPIDEMIA 04/09/2007   Patient denies.   HYPERTENSION, BENIGN 04/19/2010   Hypothyroidism    Hypothyroidism    previous hyperthyroidism, s/p I-131   LUMBAR DISC DISORDER 05/27/2010   Nerve damage    Left leg   OSTEOARTHRITIS, LUMBAR SPINE 04/09/2007   RENAL CYST 05/27/2010   Spinal headache    After having back surgery in 2014    Past Surgical History:  Procedure  Laterality Date   BACK SURGERY  [id number]   CARDIAC CATHETERIZATION  2007   Dr Loanne Drilling   CHOLECYSTECTOMY     CHOLECYSTECTOMY N/A 07/13/2013   Procedure: LAPAROSCOPIC CHOLECYSTECTOMY WITH INTRAOPERATIVE CHOLANGIOGRAM;  Surgeon: Shann Medal, MD;  Location: [facility];  Service: General;  Laterality: N/A;   COLONOSCOPY  2018   COLONOSCOPY W/ POLYPECTOMY     ERCP N/A 07/14/2013   Procedure: ENDOSCOPIC RETROGRADE CHOLANGIOPANCREATOGRAPHY (ERCP);  Surgeon: [name], MD;  Location: [facility];  Service: Endoscopy;  Laterality: N/A;   EYE SURGERY Bilateral    Cataract removal   FOOT FRACTURE SURGERY     FRACTURE SURGERY     LUMBAR [place] SURGERY  08/06/2012   L3 & L4   LUMBAR DISC SURGERY  11/11/2015   L1 L2    DR NITKA   LUMBAR FUSION N/A 04/20/2015   Procedure: T12 to L1 fusion (Extension of Previous Fusion L2-S1 to T12-S1), Right Transforaminal lumbar interbody fusion, Posterior Fusion T12 to L1, with Pedicle screws, allograft, local bone graft, and  Vivigen;  Surgeon: [name], MD;  Location: [facility];  Service: Orthopedics;  Laterality: N/A;   LUMBAR LAMINECTOMY N/A 11/10/2013   Procedure: Left L1-2 far lateral approach to excise herniated nucleus pulposus;  Surgeon: [name], MD;  Location: [facility];  Service: Orthopedics;  Laterality: N/A;   LUMBAR LAMINECTOMY/DECOMPRESSION MICRODISCECTOMY Left 08/10/2012   Procedure: Dura Repair Left Side L2-L3;  Surgeon: [name], MD;  Location: [facility];  Service: Orthopedics;  Laterality: Left;  Wilson Frame, Sliding table, dura repair kit, microscope   NECK SURGERY     X 2   SINOSCOPY     SPINE SURGERY  2006   C-spine surgery x 2   UPPER GASTROINTESTINAL ENDOSCOPY      Family History  Problem Relation Age of Onset   Breast cancer Mother    Uterine cancer Mother    Heart disease Mother    Heart attack Mother 1   Prostate cancer Father    Hypertension Brother    Non-Hodgkin's lymphoma Brother    Stomach cancer Paternal Grandmother     Thyroid disease Neg Hx    Colon cancer Neg Hx    Esophageal cancer Neg Hx    Rectal cancer Neg Hx    Pancreatic cancer Neg Hx    Colon polyps Neg Hx     Social History   Socioeconomic History   Marital status: Single    Spouse name: Not on file   Number of children: 1   Years of education: Not on file   Highest education level: Not on file  Occupational History   Occupation: DISTRIBUTION MGR    Employer: [employer]  Tobacco Use   Smoking status: Former    Packs/day: 1.00    Years: 37.00    Total pack years: 37.00    Types:  Cigarettes    Quit date: 04/06/2008    Years since quitting: 13.9   Smokeless tobacco: Never   Tobacco comments:    pt has stopped smoking about 9 months now  Vaping Use   Vaping Use: Never used  Substance and Sexual Activity   Alcohol use: No   Drug use: No   Sexual activity: Not Currently  Other Topics Concern   Not on file  Social History Narrative   Works in Psychologist, educational. Retired.   Lives alone, has one child in [place].    Social Determinants of Health   Financial Resource Strain: Low Risk  (06/08/2021)   Overall Financial Resource Strain (CARDIA)    Difficulty of Paying Living Expenses: Not hard at all  Food Insecurity: No Food Insecurity (06/08/2021)   Hunger Vital Sign    Worried About Running Out of Food in the Last Year: Never true    Ran Out of Food in the Last Year: Never true  Transportation Needs: Unknown (06/08/2021)   PRAPARE - Hydrologist (Medical): No    Lack of Transportation (Non-Medical): Not on file  Physical Activity: Insufficiently Active (06/08/2021)   Exercise Vital Sign    Days of Exercise per Week: 3 days    Minutes of Exercise per Session: 40 min  Stress: No Stress Concern Present (06/08/2021)   [place]    Feeling of Stress : Not at all  Social Connections: Socially Isolated (06/08/2021)    Social Connection and Isolation Panel [NHANES]    Frequency of Communication with Friends and Family: More than three times a week    Frequency of Social Gatherings with Friends and Family: More than three times a week    Attends Religious Services: Never    Marine scientist or Organizations: No    Attends Archivist Meetings: Never    Marital Status: Divorced  Human resources officer Violence: Not At Risk (06/08/2021)   Humiliation, Afraid, Rape, and Kick questionnaire    Fear of Current or Ex-Partner: No    Emotionally Abused: No    Physically Abused: No    Sexually Abused: No    Objective    BP (!) 149/97   Pulse 75   Ht 5' 8.25" (1.734 m)   Wt 193 lb (87.5 kg)   SpO2 97%   BMI 29.13 kg/m   Physical Exam  71 year old male in no acute distress Cardiovascular exam with regular rate and rhythm Lungs clear to auscultation bilaterally  Assessment & Plan:   Problem List Items Addressed This Visit       Cardiovascular and Mediastinum   Hypertension    Blood pressure borderline in office today.  Recommend continuing current medications, no changes to be made today Recommend intermittent monitoring of blood pressure at home, DASH diet        Endocrine   Type II diabetes mellitus with peripheral autonomic neuropathy (HCC)    Most recent hemoglobin A1c was at goal, this was a few months ago Recommend continue with current medic patients, which look to make adjustments to medication regimen if continuing to have side effects from medication Plan to recheck hemoglobin A1c just prior to next appointment      Relevant Orders   Hemoglobin A1c   Hypothyroidism following radioiodine therapy    TSH has been at goal with no dosage change today We will TSH just prior to next appointment  Relevant Orders   TSH     Other   Claudication ([place])    History seems most consistent with vascular claudication as most likely etiology of left calf pain.  Given lack of  improvement with treatments directed at musculoskeletal source, we will refer to vascular specialist for further evaluation      Relevant Orders   Ambulatory referral to Vascular Surgery    Return in about 3 months (around 07/01/2022) for DM, Hypothyroidism, HTN with FBW a few days prior.   Margie Brink J De [place], MD

## 2022-03-31 NOTE — Assessment & Plan Note (Signed)
Blood pressure borderline in office today.  Recommend continuing current medications, no changes to be made today Recommend intermittent monitoring of blood pressure at home, DASH diet

## 2022-04-10 ENCOUNTER — Other Ambulatory Visit: Payer: Self-pay | Admitting: *Deleted

## 2022-04-10 DIAGNOSIS — I739 Peripheral vascular disease, unspecified: Secondary | ICD-10-CM

## 2022-04-13 ENCOUNTER — Encounter: Payer: Self-pay | Admitting: Sports Medicine

## 2022-04-13 ENCOUNTER — Ambulatory Visit (INDEPENDENT_AMBULATORY_CARE_PROVIDER_SITE_OTHER): Payer: PPO | Admitting: Sports Medicine

## 2022-04-13 DIAGNOSIS — M79605 Pain in left leg: Secondary | ICD-10-CM

## 2022-04-13 DIAGNOSIS — R252 Cramp and spasm: Secondary | ICD-10-CM

## 2022-04-13 DIAGNOSIS — M722 Plantar fascial fibromatosis: Secondary | ICD-10-CM | POA: Diagnosis not present

## 2022-04-13 DIAGNOSIS — I739 Peripheral vascular disease, unspecified: Secondary | ICD-10-CM

## 2022-04-13 DIAGNOSIS — Q666 Other congenital valgus deformities of feet: Secondary | ICD-10-CM

## 2022-04-13 NOTE — Progress Notes (Signed)
Jeff Lopez - 71 y.o. male MRN [id number]  Date of birth: 1951/02/25  Office Visit Note: Visit Date: 04/13/2022 PCP: de Lopez, Jeff J, MD Referred by: Lopez, Jeff G, MD  Subjective:  HPI: Jeff Lopez is a pleasant 71 y.o. male who presents today for follow-up of left leg cramping.  Last saw him on 03/28/2022 and did perform extracorporeal shockwave therapy to the calf at that time.  Feels like this may have helped slightly, but is still having issues.  He is having less cramping at nighttime, but noticing pain and tightness in the calf with walking longer distances.  Reports today he has been noticing that it does get better with rest.  Independent review of PCP note from 03/31/2022 does show that they agree this is likely due to claudication.  A referral to vascular surgery was placed at that time.  Vascular surgery orders only on 04/10/2022 did put in for an ultrasound ABI of bilateral lower extremities.  Pertinent ROS were reviewed with the patient and found to be negative unless otherwise specified above in HPI.   Assessment & Plan: Visit Diagnoses:  1. Pain in left leg   2. Leg cramping   3. Claudication ([place])   4. Pes planovalgus   5. Plantar fasciitis, left    Plan: Discussed with Jeff Lopez the likely cause of his left calf cramping and tightness, which at this point I am more suspicious for arterial claudication.  I did recommend proceeding with ABIs, he did have previous vascular surgery referral and they did place this order for him to obtain prior to their appointment.  He did find some benefit from the shockwave therapy at last visit, so through shared decision making elected to proceed with this again today.  I would like to see his ABI and have his vascular surgery appointment before follow-up in terms of treatment.  He does have acquired pes planovalgus with hindfoot valgus deformation, right worse than left.  He also has been dealing with some intermittent  plantar fasciitis, which I think is predisposed given his foot shape.  He has broken down his prior orthotics that he had many years ago, I would like to send him to the sports medicine center for custom orthotics.  We did discuss possible gabapentin usage if this is neurogenic claudication, however would wait until his vascular surgery appointment and ABI follow-up.  Follow-up: F/u with me after vascular surgery evaluation    Meds & Orders: No orders of the defined types were placed in this encounter.   Orders Placed This Encounter  Procedures   Ambulatory referral for Orthotics     Procedures: No procedures performed     Procedure: ECSWT Indications:  Calf pain, fascial restriction   Procedure Details Consent: Risks of procedure as well as the alternatives and risks of each were explained to the patient.  Verbal consent for procedure obtained. Time Out: Verified patient identification, verified procedure, site was marked, verified correct patient position. The area was cleaned with alcohol swab.     The left calf and myotendinous junction was targeted for Extracorporeal shockwave therapy.    Preset: Myofascial trigger point Power Level: 120 mJ          Frequency: 10 Hz Impulse/cycles: 2500 Head size: Regular   Patient tolerated procedure well without immediate complications.  Clinical History: No specialty comments available.  He reports that he quit smoking about 14 years ago. His smoking use included cigarettes. He has a 37.00 pack-year  smoking history. He has never used smokeless tobacco.  Recent Labs    06/08/21 0934 12/07/21 1151  HGBA1C 6.8* 7.0*    Objective:    Physical Exam  Gen: Well-appearing, in no acute distress; non-toxic CV: Regular Rate. Well-perfused. Warm.  Resp: Breathing unlabored on room air; no wheezing. Psych: Fluid speech in conversation; appropriate affect; normal thought process Neuro: Sensation intact throughout. No gross coordination  deficits.   Ortho Exam -No overlying redness, erythema or swelling of the calves.  Circumference of both calves are equivocal bilaterally.  There is some mild TTP with deep palpation within the mid belly of the calf musculature.  There is a mild degree of pallor of the distal leg and foot on the left, compared to the right.  It is difficult to appreciate posterior tibial or dorsalis pedis artery pulsation.  Negative Homans' sign.  Imaging: XR Tibia/Fibula Left 2 extended views of the tibia and fibula of the left leg were ordered and  reviewed by myself.  X-rays demonstrate no acute osseous abnormality or  fracture.  There is some very small calcification within the posterior  aspect of the knee on lateral view.  Small plantar calcaneal spur and 3  surgical screws within the midfoot.    Past Medical/Family/Surgical/Social History: Medications & Allergies reviewed per EMR, new medications updated. Patient Active Problem List   Diagnosis Date Noted   Claudication ([place]) 03/31/2022   Insomnia due to medical condition 01/24/2022   Pruritus 08/24/2020   OSA (obstructive sleep apnea) 11/25/2018   Non-restorative sleep 11/25/2018   Malignant neoplasm of ascending colon ([place]) 05/16/2018   Low testosterone 03/02/2018   Gynecomastia 02/28/2018   Left lower quadrant pain 12/26/2017   Arthralgia 10/03/2017   Numbness 10/03/2017   Chronic pansinusitis 01/09/2017   Deviated septum 01/09/2017   Chronic left sacroiliac pain 10/19/2016   Chronic left-sided low back pain with left-sided sciatica 07/31/2016   Spinal stenosis, lumbar region, with neurogenic claudication 04/21/2015    Class: Chronic   Spondylolisthesis of lumbar region 04/21/2015    Class: Chronic   Hypothyroidism following radioiodine therapy 03/10/2015   Screening for prostate cancer 03/10/2015   Hemochromatosis 03/27/2014   Choledocholithiasis 07/14/2013   Nonspecific (abnormal) findings on radiological and other examination of  biliary tract 07/13/2013   Calculus of gallbladder with acute cholecystitis, without mention of obstruction 07/13/2013   Calculus of bile duct without mention of cholecystitis or obstruction 07/13/2013   Abdominal pain 07/12/2013   Neck pain on left side 05/30/2013   Quit smoking 11/28/2012   Postoperative CSF leak 08/10/2012    Class: Acute   Degenerative disc disease, lumbar 08/06/2012    Class: Chronic   HNP (herniated nucleus pulposus), lumbar 08/06/2012    Class: Acute   Type II diabetes mellitus with peripheral autonomic neuropathy ([place]) 06/10/2012   COPD GOLD 0 02/09/2012   Chest pain at rest 02/06/2012   Mixed allergic and non-allergic rhinitis 02/06/2012   Radiculopathy of leg 11/05/2010   RENAL CYST 05/27/2010   LUMBAR [place] DISORDER 05/27/2010   Hypertension 04/19/2010   CAD, NATIVE VESSEL 04/19/2010   Hyperlipidemia associated with type 2 diabetes mellitus ([place]) 04/09/2007   GERD 04/09/2007   OSTEOARTHRITIS, LUMBAR SPINE 04/09/2007   Past Medical History:  Diagnosis Date   Allergy    per pt   CAD, NATIVE VESSEL 04/19/2010   nonobstructive by cath 2007:  oLAD 20-30%, mLAD 50%, pCFX 20-30%, oAVCFX 20-30%, L renal art 50%;  normal LVF   Cataract  bil cataracts removed   COPD (chronic obstructive pulmonary disease) ([place])    Diabetes mellitus without complication ([place])    GERD 04/09/2007   Headache(784.0)    HYPERLIPIDEMIA 04/09/2007   Patient denies.   HYPERTENSION, BENIGN 04/19/2010   Hypothyroidism    Hypothyroidism    previous hyperthyroidism, s/p I-131   LUMBAR DISC DISORDER 05/27/2010   Nerve damage    Left leg   OSTEOARTHRITIS, LUMBAR SPINE 04/09/2007   RENAL CYST 05/27/2010   Spinal headache    After having back surgery in 2014   Family History  Problem Relation Age of Onset   Breast cancer Mother    Uterine cancer Mother    Heart disease Mother    Heart attack Mother 92   Prostate cancer Father    Hypertension Brother    Non-Hodgkin's lymphoma  Brother    Stomach cancer Paternal Grandmother    Thyroid disease Neg Hx    Colon cancer Neg Hx    Esophageal cancer Neg Hx    Rectal cancer Neg Hx    Pancreatic cancer Neg Hx    Colon polyps Neg Hx    Past Surgical History:  Procedure Laterality Date   BACK SURGERY  2012,2014   CARDIAC CATHETERIZATION  2007   Dr Loanne Drilling   CHOLECYSTECTOMY     CHOLECYSTECTOMY N/A 07/13/2013   Procedure: LAPAROSCOPIC CHOLECYSTECTOMY WITH INTRAOPERATIVE CHOLANGIOGRAM;  Surgeon: Shann Medal, MD;  Location: [facility];  Service: General;  Laterality: N/A;   COLONOSCOPY  2018   COLONOSCOPY W/ POLYPECTOMY     ERCP N/A 07/14/2013   Procedure: ENDOSCOPIC RETROGRADE CHOLANGIOPANCREATOGRAPHY (ERCP);  Surgeon: [name], MD;  Location: [facility];  Service: Endoscopy;  Laterality: N/A;   EYE SURGERY Bilateral    Cataract removal   FOOT FRACTURE SURGERY     FRACTURE SURGERY     LUMBAR [place] SURGERY  08/06/2012   L3 & L4   LUMBAR DISC SURGERY  11/11/2015   L1 L2    DR NITKA   LUMBAR FUSION N/A 04/20/2015   Procedure: T12 to L1 fusion (Extension of Previous Fusion L2-S1 to T12-S1), Right Transforaminal lumbar interbody fusion, Posterior Fusion T12 to L1, with Pedicle screws, allograft, local bone graft, and  Vivigen;  Surgeon: [name], MD;  Location: [facility];  Service: Orthopedics;  Laterality: N/A;   LUMBAR LAMINECTOMY N/A 11/10/2013   Procedure: Left L1-2 far lateral approach to excise herniated nucleus pulposus;  Surgeon: [name], MD;  Location: [facility];  Service: Orthopedics;  Laterality: N/A;   LUMBAR LAMINECTOMY/DECOMPRESSION MICRODISCECTOMY Left 08/10/2012   Procedure: Dura Repair Left Side L2-L3;  Surgeon: [name], MD;  Location: [facility];  Service: Orthopedics;  Laterality: Left;  Wilson Frame, Sliding table, dura repair kit, microscope   NECK SURGERY     X 2   SINOSCOPY     SPINE SURGERY  2006   C-spine surgery x 2   UPPER GASTROINTESTINAL ENDOSCOPY     Social History   Occupational  History   Occupation: DISTRIBUTION [employer]    Employer: [employer]  Tobacco Use   Smoking status: Former    Packs/day: 1.00    Years: 37.00    Total pack years: 37.00    Types: Cigarettes    Quit date: 04/06/2008    Years since quitting: 14.0   Smokeless tobacco: Never   Tobacco comments:    pt has stopped smoking about 9 months now  Vaping Use   Vaping Use:  Never used  Substance and Sexual Activity   Alcohol use: No   Drug use: No   Sexual activity: Not Currently   I spent 39 minutes in the care of the patient today including face-to-face time, preparation to see the patient, as well as shockwave treatment therapy; independent review of prior PCP notes; care coordination with ABIs and vascular surgery appointment; extracorporeal shockwave treatment therapy today; discussion on use and R/B/I for custom orthotics with referral for the above diagnoses.   Elba Barman, DO Primary Care Sports Medicine Physician  [place]  This note was dictated using Dragon naturally speaking software and may contain errors in syntax, spelling, or content which have not been identified prior to signing this note.

## 2022-04-13 NOTE — Progress Notes (Signed)
Patient states the left leg is no better. He is still having the cramping sensation. He feels like the SWT helped some, but wore off. He saw PCP and brought it up to them, and they referred to a vascular specialists.

## 2022-04-17 ENCOUNTER — Other Ambulatory Visit: Payer: Self-pay | Admitting: Family Medicine

## 2022-04-17 DIAGNOSIS — I10 Essential (primary) hypertension: Secondary | ICD-10-CM

## 2022-04-18 ENCOUNTER — Ambulatory Visit (HOSPITAL_COMMUNITY)
Admission: RE | Admit: 2022-04-18 | Discharge: 2022-04-18 | Disposition: A | Discharge status: Home / Self Care | Payer: PPO | Source: Ambulatory Visit | Attending: Vascular Surgery | Admitting: Vascular Surgery

## 2022-04-18 DIAGNOSIS — I739 Peripheral vascular disease, unspecified: Secondary | ICD-10-CM

## 2022-04-19 ENCOUNTER — Encounter: Payer: Self-pay | Admitting: Vascular Surgery

## 2022-04-19 ENCOUNTER — Ambulatory Visit: Payer: PPO | Admitting: Vascular Surgery

## 2022-04-19 VITALS — BP 149/86 | HR 74 | Temp 97.9°F | Resp 20 | Ht 68.0 in | Wt 201.0 lb

## 2022-04-19 DIAGNOSIS — I70212 Atherosclerosis of native arteries of extremities with intermittent claudication, left leg: Secondary | ICD-10-CM | POA: Diagnosis not present

## 2022-04-19 NOTE — Progress Notes (Signed)
Patient ID: Jeff Lopez, male   DOB: Apr 29, 1951, 71 y.o.   MRN: [id number]  Reason for Consult: New Patient (Initial Visit)   Referred by de [place], Buren Kos, MD  Subjective:     HPI:  Jeff Lopez is a 71 y.o. male without significant vascular disease does have a history of coronary artery disease as well as diabetes and is a longtime former smoker quit 20 years ago.  Also has hypertension and hyperlipidemia.  He does not take any antiplatelet agents but has tolerated aspirin in the past.  States that 3 months ago he was able to walk up to 3 miles and then suddenly got to where he could not walk more than 20 feet.  He is also having cramping in the middle the night requiring him to walk through the house to resolve.  He is not currently experiencing rest pain denies any tissue loss or ulceration.  No history of stroke, TIA or amaurosis and no personal or family history of aneurysm disease.  Past Medical History:  Diagnosis Date   Allergy    per pt   CAD, NATIVE VESSEL 04/19/2010   nonobstructive by cath 2007:  oLAD 20-30%, mLAD 50%, pCFX 20-30%, oAVCFX 20-30%, L renal art 50%;  normal LVF   Cataract    bil cataracts removed   COPD (chronic obstructive pulmonary disease) ([facility])    Diabetes mellitus without complication ([facility])    GERD 04/09/2007   Headache(784.0)    HYPERLIPIDEMIA 04/09/2007   Patient denies.   HYPERTENSION, BENIGN 04/19/2010   Hypothyroidism    Hypothyroidism    previous hyperthyroidism, s/p I-131   LUMBAR DISC DISORDER 05/27/2010   Nerve damage    Left leg   OSTEOARTHRITIS, LUMBAR SPINE 04/09/2007   RENAL CYST 05/27/2010   Spinal headache    After having back surgery in 2014   Family History  Problem Relation Age of Onset   Breast cancer Mother    Uterine cancer Mother    Heart disease Mother    Heart attack Mother 50   Prostate cancer Father    Hypertension Brother    Non-Hodgkin's lymphoma Brother    Stomach cancer Paternal Grandmother     Thyroid disease Neg Hx    Colon cancer Neg Hx    Esophageal cancer Neg Hx    Rectal cancer Neg Hx    Pancreatic cancer Neg Hx    Colon polyps Neg Hx    Past Surgical History:  Procedure Laterality Date   BACK SURGERY  2012,2014   CARDIAC CATHETERIZATION  2007   Dr Everardo All   CHOLECYSTECTOMY     CHOLECYSTECTOMY N/A 07/13/2013   Procedure: LAPAROSCOPIC CHOLECYSTECTOMY WITH INTRAOPERATIVE CHOLANGIOGRAM;  Surgeon: Kandis Cocking, MD;  Location: [facility];  Service: General;  Laterality: N/A;   COLONOSCOPY  2018   COLONOSCOPY W/ POLYPECTOMY     ERCP N/A 07/14/2013   Procedure: ENDOSCOPIC RETROGRADE CHOLANGIOPANCREATOGRAPHY (ERCP);  Surgeon: Rachael Fee, MD;  Location: [facility];  Service: Endoscopy;  Laterality: N/A;   EYE SURGERY Bilateral    Cataract removal   FOOT FRACTURE SURGERY     FRACTURE SURGERY     LUMBAR DISC SURGERY  08/06/2012   L3 & L4   LUMBAR DISC SURGERY  11/11/2015   L1 L2    DR NITKA   LUMBAR FUSION N/A 04/20/2015   Procedure: T12 to L1 fusion (Extension of Previous Fusion L2-S1 to T12-S1), Right Transforaminal lumbar interbody fusion, Posterior Fusion T12 to  L1, with Pedicle screws, allograft, local bone graft, and  Vivigen;  Surgeon: [name], MD;  Location: [facility];  Service: Orthopedics;  Laterality: N/A;   LUMBAR LAMINECTOMY N/A 11/10/2013   Procedure: Left L1-2 far lateral approach to excise herniated nucleus pulposus;  Surgeon: [name], MD;  Location: [facility];  Service: Orthopedics;  Laterality: N/A;   LUMBAR LAMINECTOMY/DECOMPRESSION MICRODISCECTOMY Left 08/10/2012   Procedure: Dura Repair Left Side L2-L3;  Surgeon: [name], MD;  Location: [facility];  Service: Orthopedics;  Laterality: Left;  Wilson Frame, Sliding table, dura repair kit, microscope   NECK SURGERY     X 2   SINOSCOPY     SPINE SURGERY  2006   C-spine surgery x 2   UPPER GASTROINTESTINAL ENDOSCOPY      Short Social History:  Social History   Tobacco Use   Smoking status: Former     Packs/day: 1.00    Years: 37.00    Total pack years: 37.00    Types: Cigarettes    Quit date: 04/06/2008    Years since quitting: 14.0   Smokeless tobacco: Never   Tobacco comments:    pt has stopped smoking about 9 months now  Substance Use Topics   Alcohol use: No    Allergies  Allergen Reactions   Ceftin Other (See Comments)    Patient stated it caused "sores in mouth" Thrush   Cefuroxime Axetil Other (See Comments)    Sores in mouth Sores in mouth Patient stated it caused "sores in mouth" Thrush other   Cephalexin Other (See Comments)    Other    Current Outpatient Medications  Medication Sig Dispense Refill   amLODipine-olmesartan (AZOR) 5-40 MG tablet Take 1 tablet by mouth daily. 90 tablet 0   cholecalciferol (VITAMIN D3) 25 MCG (1000 UT) tablet Take 1,000 Units by mouth daily.     Eszopiclone 3 MG TABS Take 1 tablet (3 mg total) by mouth at bedtime. Take immediately before bedtime 30 tablet 3   levothyroxine (SYNTHROID) 137 MCG tablet Take 137 mcg by mouth every morning.     metoprolol succinate (TOPROL-XL) 50 MG 24 hr tablet TAKE 1 TABLET BY MOUTH EVERY DAY WITH OR IMMEDIATELY FOLLOWING A MEAL (Patient taking differently: Take 25 mg by mouth daily.) 90 tablet 3   pantoprazole (PROTONIX) 40 MG tablet Take 1 tablet (40 mg total) by mouth 2 (two) times daily before a meal. 180 tablet 3   Semaglutide (RYBELSUS) 14 MG TABS Take 1 tablet (14 mg total) by mouth daily. 30 tablet 3   No current facility-administered medications for this visit.    Review of Systems  Constitutional:  Constitutional negative. HENT: HENT negative.  Eyes: Eyes negative.  Cardiovascular: Positive for claudication.  GI: Gastrointestinal negative.  Musculoskeletal: Positive for gait problem and leg pain.  Skin: Skin negative.  Hematologic: Hematologic/lymphatic negative.  Psychiatric: Psychiatric negative.        Objective:  Objective   Vitals:   04/19/22 0926  BP: (!) 149/86   Pulse: 74  Resp: 20  Temp: 97.9 F (36.6 C)  SpO2: 92%  Weight: 201 lb (91.2 kg)  Height: $Remove'5\' 8"'BsvqWqi$  (1.727 m)   Body mass index is 30.56 kg/m.  Physical Exam HENT:     Head: Normocephalic.     Nose: Nose normal.  Eyes:     Pupils: Pupils are equal, round, and reactive to light.  Neck:     Vascular: No carotid bruit.  Cardiovascular:  Rate and Rhythm: Normal rate.     Pulses:          Femoral pulses are 2+ on the right side and 2+ on the left side.      Popliteal pulses are 0 on the right side and 0 on the left side.     Heart sounds: Normal heart sounds.  Pulmonary:     Effort: Pulmonary effort is normal.  Abdominal:     General: Abdomen is flat.     Palpations: Abdomen is soft.  Musculoskeletal:     Right lower leg: No edema.     Left lower leg: No edema.  Skin:    General: Skin is warm and dry.     Capillary Refill: Capillary refill takes more than 3 seconds.  Neurological:     General: No focal deficit present.     Mental Status: He is alert.  Psychiatric:        Mood and Affect: Mood normal.        Thought Content: Thought content normal.     Data: ABI Findings:  +---------+------------------+-----+-----------+--------+  Right    Rt Pressure (mmHg)IndexWaveform   Comment   +---------+------------------+-----+-----------+--------+  Brachial 145                                         +---------+------------------+-----+-----------+--------+  PTA      152               1.00 monophasic           +---------+------------------+-----+-----------+--------+  DP       148               0.97 multiphasic          +---------+------------------+-----+-----------+--------+  Great Toe107               0.70 Normal               +---------+------------------+-----+-----------+--------+   +---------+------------------+-----+-------------------+-------+  Left     Lt Pressure (mmHg)IndexWaveform           Comment   +---------+------------------+-----+-------------------+-------+  Brachial 152                                                +---------+------------------+-----+-------------------+-------+  PTA      123               0.81 monophasic                  +---------+------------------+-----+-------------------+-------+  DP       124               0.82 dampened monophasic         +---------+------------------+-----+-------------------+-------+  Great Toe75                0.49 Abnormal                    +---------+------------------+-----+-------------------+-------+   +-------+-----------+-----------+------------+------------+  ABI/TBIToday's ABIToday's TBIPrevious ABIPrevious TBI  +-------+-----------+-----------+------------+------------+  Right  1.0        0.70                                 +-------+-----------+-----------+------------+------------+  Left   0.82       0.49                                 +-------+-----------+-----------+------------+------------+          Assessment/Plan:    71 year old male with no acute onset of very short distance life limiting claudication that began approximately 3 months ago and now prevents him from walking even through the grocery store more than 200 feet where previously he was walking 3 miles.  He does not have any palpable pulses below the common femorals on the left side has severely diminished capillary refill.  He does have some evidence of rest pain although claudication appears to be his most significant symptom at this time.  Given the acute nature and the light limitation of his disease process we will begin with angiography from the right common femoral approach.  We have discussed possible intervention including stenting or balloon angioplasty possible need for bypass surgery and he demonstrates good understanding.  Patient would like to wait until January to have this scheduled which I think is  certainly reasonable and I discussed with him at this time he has not been limb threatening disease process.  He will begin aspirin therapy daily.     Waynetta Sandy MD [facility]

## 2022-04-20 ENCOUNTER — Encounter: Payer: PPO | Admitting: Sports Medicine

## 2022-04-24 ENCOUNTER — Other Ambulatory Visit: Payer: Self-pay | Admitting: Surgical

## 2022-04-25 ENCOUNTER — Ambulatory Visit: Payer: PPO | Admitting: Pulmonary Disease

## 2022-04-25 ENCOUNTER — Other Ambulatory Visit: Payer: Self-pay | Admitting: Family Medicine

## 2022-04-25 DIAGNOSIS — I10 Essential (primary) hypertension: Secondary | ICD-10-CM

## 2022-04-28 ENCOUNTER — Encounter (HOSPITAL_BASED_OUTPATIENT_CLINIC_OR_DEPARTMENT_OTHER): Payer: Self-pay | Admitting: Nurse Practitioner

## 2022-04-28 ENCOUNTER — Ambulatory Visit (INDEPENDENT_AMBULATORY_CARE_PROVIDER_SITE_OTHER): Payer: PPO | Admitting: Nurse Practitioner

## 2022-04-28 VITALS — BP 145/101 | HR 75 | Ht 71.0 in | Wt 194.2 lb

## 2022-04-28 DIAGNOSIS — J111 Influenza due to unidentified influenza virus with other respiratory manifestations: Secondary | ICD-10-CM

## 2022-04-28 DIAGNOSIS — U071 COVID-19: Secondary | ICD-10-CM

## 2022-04-28 LAB — POCT INFLUENZA A/B
Influenza A, POC: NEGATIVE
Influenza B, POC: NEGATIVE

## 2022-04-28 LAB — POC COVID19 BINAXNOW: SARS Coronavirus 2 Ag: POSITIVE — AB

## 2022-04-28 MED ORDER — NIRMATRELVIR/RITONAVIR (PAXLOVID)TABLET: 3.0000 | ORAL_TABLET | Freq: Two times a day (BID) | ORAL | 0 refills | Status: AC

## 2022-04-28 NOTE — Patient Instructions (Addendum)
I have sent in the medication, Paxlovid, for treatment for COVID. This is 3 pills two times a day for 5 days. Be sure to complete all of the medication, even if you begin to feel better.   The following information is provided as a Human resources officer for ADULT patients only and does NOT take into account PREGNANCY, ALLERGIES, LIVER CONDITIONS, KIDNEY CONDITIONS, GASTROINTESTINAL CONDITIONS, OR PRESCRIPTION MEDICATION INTERACTIONS. Please be sure to ask your provider if the following are safe to take with your specific medical history, conditions, or current medication regimen if you are unsure.   Adult OTC Symptom Management for COVID-19  Be sure to drink plenty of fluids and allow your body to rest as much as possible.   Congestion: Guaifenesin (Mucinex)- follow directions on packaging with a maximum dose of '2400mg'$  in a 24 hour period. Pseudoephedrine- follow the directions on the packaging.   Pain/Fever: Acetaminophen '500mg'$  -'1000mg'$  every 6-8 hours as needed (MAX '3000mg'$  in a 24 hour period) Ibuprofen may be used if Acetaminophen is not helpful-  Ibuprofen '200mg'$  - '400mg'$  every 4-6 hours as needed (MAX '1200mg'$  in a 24 hour period)  Cough: Dextromethorphan (Delsym)- follow directions on packing with a maximum dose of '120mg'$  in a 24 hour period.  Nasal Stuffiness: Saline nasal spray and/or Nettie Pot with sterile saline solution. Oxymetazoline (Afrin) may be used for NO MORE than 3 days  Runny Nose: Fluticasone nasal spray (Flonase) OR Mometasone nasal spray (Nasonex) OR Triamcinolone Acetonide nasal spray (Nasacort)- follow directions on the packaging  Sinus Pain/Pressure: Warm washcloth to the face   Sore Throat: Warm salt water gargles, warm tea, honey, Cepacol or Chloraseptic lozenges   Vics Vapo Rub or other mentholated rubs may help relieve symptoms when used per instructions on the packaging.   **Many medications will have more than one ingredient, be sure you are reading the packaging  carefully and not taking more than one dose of the same kind of medication at the same time or too close together. It is OK to use formulas that have all of the ingredients you want, but do not take them in a combined medication and as separate dose too close together. If you have any questions, the pharmacist will be happy to help you decide what is safe.  IF YOU EXPERIENCE SHORTNESS OF BREATH AT REST, DIZZINESS, OR A CHANGE IN MENTAL STATUS YOU NEED TO SEEK EMERGENCY CARE IMMEDIATELY.

## 2022-04-28 NOTE — Progress Notes (Signed)
  Orma Render, DNP, AGNP-c Primary Care & Sports Medicine [address]  [place], [place] [address] [phone]  Subjective:   Jeff Lopez is a 71 y.o. male presents to day for evaluation of: Acute Visit (Pt presents today no taste, fever, cold chills. Cough X 2 weeks)  1. COVID-19 2. Flu [place] presents with cold and flu symptoms for about 2 weeks. He has recently lost his taste. He has been running a fever.   PMH, Medications, and Allergies reviewed and updated in chart as appropriate.   ROS negative except for what is listed in HPI. Objective:  BP (!) 145/101   Pulse 75   Ht '5\' 11"'$  (1.803 m)   Wt 194 lb 3.2 oz (88.1 kg)   SpO2 98%   BMI 27.09 kg/m  Physical Exam Vitals and nursing note reviewed.  Constitutional:      Appearance: Normal appearance. He is ill-appearing.  HENT:     Head: Normocephalic.     Nose: Congestion and rhinorrhea present.     Mouth/Throat:     Mouth: Mucous membranes are moist.     Pharynx: Posterior oropharyngeal erythema present.  Eyes:     Extraocular Movements: Extraocular movements intact.     Pupils: Pupils are equal, round, and reactive to light.  Cardiovascular:     Rate and Rhythm: Normal rate and regular rhythm.     Pulses: Normal pulses.     Heart sounds: Normal heart sounds.  Pulmonary:     Effort: Pulmonary effort is normal.     Breath sounds: Wheezing present.  Abdominal:     General: Bowel sounds are normal.     Palpations: Abdomen is soft.  Lymphadenopathy:     Cervical: Cervical adenopathy present.  Skin:    General: Skin is warm and dry.     Capillary Refill: Capillary refill takes less than 2 seconds.  Neurological:     General: No focal deficit present.     Mental Status: He is alert and oriented to person, place, and time.  Psychiatric:        Mood and Affect: Mood normal.           Assessment & Plan:   Problem List Items Addressed This Visit   None Visit  Diagnoses     COVID-19    -  Primary   Relevant Orders   POC COVID-19 (Completed)   Flu       Relevant Orders   POCT Influenza A/B (Completed)     Positive for both COVID-19 and flu A in the office today. WIll begin treatment with Paxlovid and supportive care. Recommend close monitoring for new or worsening symptoms or if symptoms continue beyond 2 weeks. F/U with Dr. Burnard Bunting as needed.   Orma Render, DNP, AGNP-c 04/28/2022  11:34 AM    History, Medications, Surgery, SDOH, and Family History reviewed and updated as appropriate.

## 2022-05-01 ENCOUNTER — Other Ambulatory Visit: Payer: Self-pay | Admitting: Family Medicine

## 2022-05-02 ENCOUNTER — Other Ambulatory Visit (HOSPITAL_BASED_OUTPATIENT_CLINIC_OR_DEPARTMENT_OTHER): Payer: Self-pay | Admitting: Family Medicine

## 2022-05-02 MED ORDER — ESZOPICLONE 2 MG PO TABS: 2.0000 mg | ORAL_TABLET | Freq: Every evening | ORAL | 0 refills | Status: DC | PRN

## 2022-05-04 ENCOUNTER — Ambulatory Visit: Payer: PPO | Admitting: Sports Medicine

## 2022-05-09 ENCOUNTER — Ambulatory Visit (INDEPENDENT_AMBULATORY_CARE_PROVIDER_SITE_OTHER): Payer: PPO

## 2022-05-09 ENCOUNTER — Encounter: Payer: Self-pay | Admitting: Sports Medicine

## 2022-05-09 ENCOUNTER — Encounter (HOSPITAL_BASED_OUTPATIENT_CLINIC_OR_DEPARTMENT_OTHER): Payer: Self-pay | Admitting: Family Medicine

## 2022-05-09 ENCOUNTER — Ambulatory Visit (INDEPENDENT_AMBULATORY_CARE_PROVIDER_SITE_OTHER): Payer: PPO | Admitting: Family Medicine

## 2022-05-09 ENCOUNTER — Ambulatory Visit (INDEPENDENT_AMBULATORY_CARE_PROVIDER_SITE_OTHER): Payer: PPO | Admitting: Sports Medicine

## 2022-05-09 VITALS — BP 159/92 | HR 75 | Temp 97.8°F | Ht 71.0 in | Wt 199.4 lb

## 2022-05-09 DIAGNOSIS — I739 Peripheral vascular disease, unspecified: Secondary | ICD-10-CM

## 2022-05-09 DIAGNOSIS — R059 Cough, unspecified: Secondary | ICD-10-CM | POA: Diagnosis not present

## 2022-05-09 DIAGNOSIS — R252 Cramp and spasm: Secondary | ICD-10-CM | POA: Diagnosis not present

## 2022-05-09 MED ORDER — BENZONATATE 200 MG PO CAPS: 200.0000 mg | ORAL_CAPSULE | Freq: Three times a day (TID) | ORAL | 0 refills | Status: DC | PRN

## 2022-05-09 NOTE — Assessment & Plan Note (Signed)
Patient presents for evaluation of ongoing cough and continued upper respiratory symptoms.  Patient was seen about 10 days ago for URI symptoms and was diagnosed with COVID-19 at that time.  He indicates that symptoms initially began about 5 to 7 days prior to that last office visit.  After diagnosis of COVID-19, he was started on Paxlovid and feels that this did not provide improvement in symptoms for him, however he recently noted some worsening symptoms including cough, sinus congestion, generalized fatigue.  He also indicates fever, chills, sweats, however he has not been checking his temperature at home and more so has subjectively felt warm.  He has had prior COVID vaccines. On exam, patient is in no acute distress, vital signs stable.  He is able to speak in complete sentences, no obvious increased work of breathing during appointment.  Lungs with intermittent expiratory wheezing bilaterally.  Cardiovascular exam with regular rate and rhythm. Discussed with patient that current symptoms are most likely related to postviral syndrome, also suspect possible rebound from recent Paxlovid treatment and is acute worsening of symptoms within 10 days of completing treatment with Paxlovid. Given worsening symptoms, cough, wheezing, we will proceed with chest x-ray today Discussed with patient that symptoms are less likely related to bacterial etiology and thus no role for antibiotics at this time.  Would recommend utilizing general measures to help with controlling symptoms.  Prescription sent to pharmacy for [facility] for patient utilize to help with cough as needed.  Can utilize nasal saline spray, intranasal steroid spray to help with controlling sinus congestion We will plan for follow-up as needed, particularly if symptoms persist.  Did discuss general expectations in regards to gradual symptom improvement over the coming weeks

## 2022-05-09 NOTE — Progress Notes (Signed)
    Procedures performed today:    None.  Independent interpretation of notes and tests performed by another provider:   None.  Brief History, Exam, Impression, and Recommendations:    BP (!) 159/92 (BP Location: Right Arm, Patient Position: Sitting, Cuff Size: Large)   Pulse 75   Temp 97.8 F (36.6 C) (Oral)   Ht '5\' 11"'$  (1.803 m)   Wt 199 lb 6.4 oz (90.4 kg)   SpO2 100%   BMI 27.81 kg/m   Cough Patient presents for evaluation of ongoing cough and continued upper respiratory symptoms.  Patient was seen about 10 days ago for URI symptoms and was diagnosed with COVID-19 at that time.  He indicates that symptoms initially began about 5 to 7 days prior to that last office visit.  After diagnosis of COVID-19, he was started on Paxlovid and feels that this did not provide improvement in symptoms for him, however he recently noted some worsening symptoms including cough, sinus congestion, generalized fatigue.  He also indicates fever, chills, sweats, however he has not been checking his temperature at home and more so has subjectively felt warm.  He has had prior COVID vaccines. On exam, patient is in no acute distress, vital signs stable.  He is able to speak in complete sentences, no obvious increased work of breathing during appointment.  Lungs with intermittent expiratory wheezing bilaterally.  Cardiovascular exam with regular rate and rhythm. Discussed with patient that current symptoms are most likely related to postviral syndrome, also suspect possible rebound from recent Paxlovid treatment and is acute worsening of symptoms within 10 days of completing treatment with Paxlovid. Given worsening symptoms, cough, wheezing, we will proceed with chest x-ray today Discussed with patient that symptoms are less likely related to bacterial etiology and thus no role for antibiotics at this time.  Would recommend utilizing general measures to help with controlling symptoms.  Prescription sent to  pharmacy for [facility] for patient utilize to help with cough as needed.  Can utilize nasal saline spray, intranasal steroid spray to help with controlling sinus congestion We will plan for follow-up as needed, particularly if symptoms persist.  Did discuss general expectations in regards to gradual symptom improvement over the coming weeks  Return if symptoms worsen or fail to improve.   ___________________________________________ Tymika Grilli de [place], MD, ABFM, CAQSM Primary Care and [place]

## 2022-05-09 NOTE — Progress Notes (Signed)
Jeff Lopez - 71 y.o. male MRN [id number]  Date of birth: Jul 01, 1950  Office Visit Note: Visit Date: 05/09/2022 PCP: de [place], Raymond J, MD Referred by: de [place], Raymond J, MD  Subjective: Chief Complaint  Patient presents with   Left Leg - Pain   HPI: Jeff Lopez is a pleasant 71 y.o. male who presents today for follow-up of left leg cramping and claudication.  During our last visit on 04/13/2022 I did send him to vascular surgery input and ABIs of bilateral lower extremities.  He did see vascular surgery on 04/19/2022 and they did discuss with him he had evidence of claudication, recommending angiography with possible intervention including stenting or balloon angioplasty depending on the findings.  Michela Pitcher they also mention something about a CABG or other procedures that may need to be done.  He has questions today regarding this and if this is a reasonable approach.  He still is getting claudication-like symptoms when he is walking longer distances or at the grocery store.  Occasionally he will get some pain in the left leg at night as well that is sharp and cramping-like in nature.  Vascular surgery did want him to begin aspirin therapy once daily.  Pertinent ROS were reviewed with the patient and found to be negative unless otherwise specified above in HPI.   Assessment & Plan: Visit Diagnoses:  1. PAD (peripheral artery disease) (HCC)   2. Claudication (HCC)   3. Leg cramping    Plan: Jeff Lopez today wanting to discuss the vascular surgery recommendations and get a second opinion from myself, which I appreciate.  I did discuss with him that there is no emergent need to have this angiography and/or procedures performed, but I do think his symptoms are very indicative of claudication and he may find benefit from this.  Discussed that angiography would be the likely best next step per vascular surgery recommendations, discussed that he will talk with them before the procedure  to see if stenting would be performed during this imaging procedure as well.  In terms of PAD, aspirin therapy and statin therapy is also first-line, he does not want to add an additional statin medication at this time but will continue aspirin once daily.  Would be best to also get him into a walking program, although this is limited at this time secondary to pain.  He will have a follow-up with vascular surgery and would like to keep me in the loop to discuss what we both feel is the best option for him.  He may call or return sooner if any questions arise.  Follow-up: Return if symptoms worsen or fail to improve.   Meds & Orders: No orders of the defined types were placed in this encounter.  No orders of the defined types were placed in this encounter.    Procedures: No procedures performed      Clinical History: No specialty comments available.  He reports that he quit smoking about 14 years ago. His smoking use included cigarettes. He has a 37.00 pack-year smoking history. He has never used smokeless tobacco.  Recent Labs    06/08/21 0934 12/07/21 1151  HGBA1C 6.8* 7.0*    Objective:   Vital Signs: There were no vitals taken for this visit.  Physical Exam  Gen: Well-appearing, in no acute distress; non-toxic CV: Regular Rate. Well-perfused. Warm.  Resp: Breathing unlabored on room air; no wheezing. Psych: Fluid speech in conversation; appropriate affect; normal thought process Neuro: Sensation intact  throughout. No gross coordination deficits.   Ortho Exam - LLE: No redness, erythema or swelling of the calves.  As are soft and nontender bilaterally.  There is a mild degree of paler of the distal left leg and foot compared to the right side.  Unable to appreciate posterior tibial or dorsalis pedis artery pulsation.  Walks with a nonantalgic gait.  Imaging:  *Interpretation of the ABI result shows peripheral arterial disease, left greater than right with abnormal testing at  the level of the great toe 0.49 ABI.  Dampened monophasic at that DP.  Examination Guidelines: A complete evaluation includes at minimum, Doppler  waveform signals and systolic blood pressure reading at the level of  bilateral  brachial, anterior tibial, and posterior tibial arteries, when vessel  segments  are accessible. Bilateral testing is considered an integral part of a  complete  examination. Photoelectric Plethysmograph (PPG) waveforms and toe systolic  pressure readings are included as required and additional duplex testing  as  needed. Limited examinations for reoccurring indications may be performed  as  noted.     ABI Findings:  +---------+------------------+-----+-----------+--------+  Right   Rt Pressure (mmHg)IndexWaveform   Comment   +---------+------------------+-----+-----------+--------+  Brachial 145                                         +---------+------------------+-----+-----------+--------+  PTA     152               1.00 monophasic           +---------+------------------+-----+-----------+--------+  DP      148               0.97 multiphasic          +---------+------------------+-----+-----------+--------+  Great Toe107               0.70 Normal               +---------+------------------+-----+-----------+--------+   +---------+------------------+-----+-------------------+-------+  Left    Lt Pressure (mmHg)IndexWaveform           Comment  +---------+------------------+-----+-------------------+-------+  Brachial 152                                                +---------+------------------+-----+-------------------+-------+  PTA     123               0.81 monophasic                  +---------+------------------+-----+-------------------+-------+  DP      124               0.82 dampened monophasic         +---------+------------------+-----+-------------------+-------+  Great Toe75                 0.49 Abnormal                    +---------+------------------+-----+-------------------+-------+   +-------+-----------+-----------+------------+------------+  ABI/TBIToday's ABIToday's TBIPrevious ABIPrevious TBI  +-------+-----------+-----------+------------+------------+  Right 1.0        0.70                                 +-------+-----------+-----------+------------+------------+  Left  0.82       0.49                                 +-------+-----------+-----------+------------+------------+        Arterial wall calcification precludes accurate ankle pressures and ABIs.    Summary:  Right: Resting right ankle-brachial index is within normal range. The  right toe-brachial index is normal.   RT TBI borderline normal 0.70=0.70.   Left: Resting left ankle-brachial index indicates mild left lower  extremity arterial disease. The left toe-brachial index is abnormal. PPG  tracings appear dampened.    *See table(s) above for measurements and observations.      Electronically signed by Deitra Mayo MD on 04/18/2022 at 4:08:06  PM.   Past Medical/Family/Surgical/Social History: Medications & Allergies reviewed per EMR, new medications updated. Patient Active Problem List   Diagnosis Date Noted   COVID-19 04/28/2022   Claudication ([place]) 03/31/2022   Insomnia due to medical condition 01/24/2022   Pruritus 08/24/2020   OSA (obstructive sleep apnea) 11/25/2018   Non-restorative sleep 11/25/2018   Malignant neoplasm of ascending colon ([place]) 05/16/2018   Low testosterone 03/02/2018   Gynecomastia 02/28/2018   Left lower quadrant pain 12/26/2017   Arthralgia 10/03/2017   Numbness 10/03/2017   Chronic pansinusitis 01/09/2017   Deviated septum 01/09/2017   Chronic left sacroiliac pain 10/19/2016   Chronic left-sided low back pain with left-sided sciatica 07/31/2016   Spinal stenosis, lumbar region, with neurogenic claudication 04/21/2015     Class: Chronic   Spondylolisthesis of lumbar region 04/21/2015    Class: Chronic   Hypothyroidism following radioiodine therapy 03/10/2015   Screening for prostate cancer 03/10/2015   Hemochromatosis 03/27/2014   Choledocholithiasis 07/14/2013   Nonspecific (abnormal) findings on radiological and other examination of biliary tract 07/13/2013   Calculus of gallbladder with acute cholecystitis, without mention of obstruction 07/13/2013   Calculus of bile duct without mention of cholecystitis or obstruction 07/13/2013   Abdominal pain 07/12/2013   Neck pain on left side 05/30/2013   Quit smoking 11/28/2012   Postoperative CSF leak 08/10/2012    Class: Acute   Degenerative disc disease, lumbar 08/06/2012    Class: Chronic   HNP (herniated nucleus pulposus), lumbar 08/06/2012    Class: Acute   Type II diabetes mellitus with peripheral autonomic neuropathy ([place]) 06/10/2012   COPD GOLD 0 02/09/2012   Chest pain at rest 02/06/2012   Mixed allergic and non-allergic rhinitis 02/06/2012   Radiculopathy of leg 11/05/2010   RENAL CYST 05/27/2010   LUMBAR [place] DISORDER 05/27/2010   Hypertension 04/19/2010   CAD, NATIVE VESSEL 04/19/2010   Hyperlipidemia associated with type 2 diabetes mellitus ([place]) 04/09/2007   GERD 04/09/2007   OSTEOARTHRITIS, LUMBAR SPINE 04/09/2007   Past Medical History:  Diagnosis Date   Allergy    per pt   CAD, NATIVE VESSEL 04/19/2010   nonobstructive by cath 2007:  oLAD 20-30%, mLAD 50%, pCFX 20-30%, oAVCFX 20-30%, L renal art 50%;  normal LVF   Cataract    bil cataracts removed   COPD (chronic obstructive pulmonary disease) ([place])    Diabetes mellitus without complication ([place])    GERD 04/09/2007   Headache(784.0)    HYPERLIPIDEMIA 04/09/2007   Patient denies.   HYPERTENSION, BENIGN 04/19/2010   Hypothyroidism    Hypothyroidism    previous hyperthyroidism, s/p I-131   LUMBAR DISC DISORDER 05/27/2010   Nerve damage    Left leg  OSTEOARTHRITIS,  LUMBAR SPINE 04/09/2007   RENAL CYST 05/27/2010   Spinal headache    After having back surgery in 2014   Family History  Problem Relation Age of Onset   Breast cancer Mother    Uterine cancer Mother    Heart disease Mother    Heart attack Mother 43   Prostate cancer Father    Hypertension Brother    Non-Hodgkin's lymphoma Brother    Stomach cancer Paternal Grandmother    Thyroid disease Neg Hx    Colon cancer Neg Hx    Esophageal cancer Neg Hx    Rectal cancer Neg Hx    Pancreatic cancer Neg Hx    Colon polyps Neg Hx    Past Surgical History:  Procedure Laterality Date   BACK SURGERY  2012,2014   CARDIAC CATHETERIZATION  2007   Dr Loanne Drilling   CHOLECYSTECTOMY     CHOLECYSTECTOMY N/A 07/13/2013   Procedure: LAPAROSCOPIC CHOLECYSTECTOMY WITH INTRAOPERATIVE CHOLANGIOGRAM;  Surgeon: Shann Medal, MD;  Location: [facility];  Service: General;  Laterality: N/A;   COLONOSCOPY  2018   COLONOSCOPY W/ POLYPECTOMY     ERCP N/A 07/14/2013   Procedure: ENDOSCOPIC RETROGRADE CHOLANGIOPANCREATOGRAPHY (ERCP);  Surgeon: [name], MD;  Location: [facility];  Service: Endoscopy;  Laterality: N/A;   EYE SURGERY Bilateral    Cataract removal   FOOT FRACTURE SURGERY     FRACTURE SURGERY     LUMBAR [place] SURGERY  08/06/2012   L3 & L4   LUMBAR DISC SURGERY  11/11/2015   L1 L2    DR NITKA   LUMBAR FUSION N/A 04/20/2015   Procedure: T12 to L1 fusion (Extension of Previous Fusion L2-S1 to T12-S1), Right Transforaminal lumbar interbody fusion, Posterior Fusion T12 to L1, with Pedicle screws, allograft, local bone graft, and  Vivigen;  Surgeon: [name], MD;  Location: [facility];  Service: Orthopedics;  Laterality: N/A;   LUMBAR LAMINECTOMY N/A 11/10/2013   Procedure: Left L1-2 far lateral approach to excise herniated nucleus pulposus;  Surgeon: [name], MD;  Location: [facility];  Service: Orthopedics;  Laterality: N/A;   LUMBAR LAMINECTOMY/DECOMPRESSION MICRODISCECTOMY Left 08/10/2012   Procedure:  Dura Repair Left Side L2-L3;  Surgeon: [name], MD;  Location: [facility];  Service: Orthopedics;  Laterality: Left;  Wilson Frame, Sliding table, dura repair kit, microscope   NECK SURGERY     X 2   SINOSCOPY     SPINE SURGERY  2006   C-spine surgery x 2   UPPER GASTROINTESTINAL ENDOSCOPY     Social History   Occupational History   Occupation: DISTRIBUTION [employer]    Employer: [employer]  Tobacco Use   Smoking status: Former    Packs/day: 1.00    Years: 37.00    Total pack years: 37.00    Types: Cigarettes    Quit date: 04/06/2008    Years since quitting: 14.0   Smokeless tobacco: Never   Tobacco comments:    pt has stopped smoking about 9 months now  Vaping Use   Vaping Use: Never used  Substance and Sexual Activity   Alcohol use: No   Drug use: No   Sexual activity: Not Currently   I spent 38 minutes in the care of the patient today including face-to-face time, preparation to see the patient, as well as review of vascular ABI study, review of vascular surgery note, counseling and educating the patient on the nature of his pain and possible treatment modalities,  explanation of angiography and possible vascular stenting general overview (that vascular surgery also discussed with him) for the above diagnoses.   Elba Barman, DO Primary Care Sports Medicine Physician  [place]  This note was dictated using Dragon naturally speaking software and may contain errors in syntax, spelling, or content which have not been identified prior to signing this note.

## 2022-05-09 NOTE — Progress Notes (Signed)
Follow up on leg; wants your opinion on the vascular study

## 2022-05-09 NOTE — Patient Instructions (Signed)
  Medication Instructions:  Your physician recommends that you continue on your current medications as directed. Please refer to the Current Medication list given to you today. --If you need a refill on any your medications before your next appointment, please call your pharmacy first. If no refills are authorized on file call the office.-- Lab Work: Your physician has recommended that you have lab work today: No If you have labs (blood work) drawn today and your tests are completely normal, you will receive your results via [facility] a phone call from our staff.  Please ensure you check your voicemail in the event that you authorized detailed messages to be left on a delegated number. If you have any lab test that is abnormal or we need to change your treatment, we will call you to review the results.  Referrals/Procedures/Imaging: Chest X-Ray  Follow-Up: Your next appointment:   Your physician recommends that you schedule a follow-up appointment as needed with Dr. de [place].  You will receive a text message or e-mail with a link to a survey about your care and experience with [place] today! We would greatly appreciate your feedback!   Thanks for letting us be apart of your health journey!!  Primary Care and Sports Medicine   Dr. Arlina Robes [place]   We encourage you to activate your patient portal called "MyChart".  Sign up information is provided on this After Visit Summary.  MyChart is used to connect with patients for Virtual Visits (Telemedicine).  Patients are able to view lab/test results, encounter notes, upcoming appointments, etc.  Non-urgent messages can be sent to your provider as well. To learn more about what you can do with MyChart, please visit --  [email or web address].

## 2022-05-15 ENCOUNTER — Telehealth (HOSPITAL_BASED_OUTPATIENT_CLINIC_OR_DEPARTMENT_OTHER): Payer: Self-pay | Admitting: *Deleted

## 2022-05-15 NOTE — Telephone Encounter (Signed)
Contacted pt and informed him of below. Jeff Lopez, CMA

## 2022-05-15 NOTE — Telephone Encounter (Signed)
-----   Message from Raymond J de [place], MD sent at 05/11/2022  8:39 AM EST ----- Chest x-ray is reassuring with no observed abnormalities of the heart or lungs.  No evidence of acute infection such as pneumonia.

## 2022-05-16 ENCOUNTER — Other Ambulatory Visit: Payer: PPO

## 2022-05-24 ENCOUNTER — Ambulatory Visit: Payer: PPO | Admitting: Family Medicine

## 2022-05-24 ENCOUNTER — Telehealth: Payer: Self-pay

## 2022-05-24 NOTE — Telephone Encounter (Signed)
Patient requested to wait to schedule aortogram after first of year when seen on 04/02/22.  Attempted to reach patient to schedule, but no answer and unable to leave voicemail.

## 2022-06-05 ENCOUNTER — Telehealth (HOSPITAL_BASED_OUTPATIENT_CLINIC_OR_DEPARTMENT_OTHER): Payer: Self-pay | Admitting: Family Medicine

## 2022-06-05 ENCOUNTER — Other Ambulatory Visit: Payer: Self-pay | Admitting: Family Medicine

## 2022-06-05 DIAGNOSIS — E890 Postprocedural hypothyroidism: Secondary | ICD-10-CM

## 2022-06-05 NOTE — Telephone Encounter (Signed)
Please have the script updated for Eszopiclone 3 MG TABS   Per the system and pt --'2mg'$  was called in --Please complete this for more than 1 month at a time --If this can not be done -please call the pt to advise

## 2022-06-06 NOTE — Telephone Encounter (Signed)
Pt was called in regards to his medication. I called and gave him medical advice from Dr. De [place] he is fine with staying on the 2 mg Lunesta. I called the pharmacy and gave the consent for the refill.

## 2022-06-06 NOTE — Telephone Encounter (Signed)
Call attempt made. No answer and unable to leave voicemail message.

## 2022-06-09 NOTE — Telephone Encounter (Signed)
Attempted to reach patient to schedule procedure. No answer and unable to leave voicemail. Will mail letter to patient requesting him to contact office.

## 2022-06-12 ENCOUNTER — Telehealth: Payer: Self-pay

## 2022-06-12 ENCOUNTER — Other Ambulatory Visit: Payer: Self-pay

## 2022-06-12 DIAGNOSIS — I70212 Atherosclerosis of native arteries of extremities with intermittent claudication, left leg: Secondary | ICD-10-CM

## 2022-06-12 DIAGNOSIS — I739 Peripheral vascular disease, unspecified: Secondary | ICD-10-CM

## 2022-06-12 NOTE — Telephone Encounter (Signed)
Returned pt's call to schedule his procedure. He did not answer and no voicemail was set up to leave message.

## 2022-06-14 MED ORDER — ESZOPICLONE 2 MG PO TABS: 2.0000 mg | ORAL_TABLET | Freq: Every evening | ORAL | 1 refills | Status: DC | PRN

## 2022-06-28 ENCOUNTER — Other Ambulatory Visit (HOSPITAL_BASED_OUTPATIENT_CLINIC_OR_DEPARTMENT_OTHER): Payer: Self-pay

## 2022-06-28 DIAGNOSIS — E1143 Type 2 diabetes mellitus with diabetic autonomic (poly)neuropathy: Secondary | ICD-10-CM

## 2022-06-28 MED ORDER — RYBELSUS 14 MG PO TABS: 14.0000 mg | ORAL_TABLET | Freq: Every day | ORAL | 3 refills | Status: DC

## 2022-06-28 NOTE — Telephone Encounter (Signed)
Patient called regarding aortogram scheduled on 07/03/22. Stated he does not have anyone to bring him to procedure, so will need to drive himself and requesting to stay overnight. Advised will have patient stay overnight for observation post procedure. Patient verbalized understanding and appreciation.

## 2022-07-03 ENCOUNTER — Observation Stay (HOSPITAL_COMMUNITY)
Admission: RE | Admit: 2022-07-03 | Discharge: 2022-07-04 | Disposition: A | Discharge status: Home / Self Care | Payer: PPO | Attending: Vascular Surgery | Admitting: Vascular Surgery

## 2022-07-03 ENCOUNTER — Encounter (HOSPITAL_COMMUNITY): Payer: Self-pay | Admitting: Vascular Surgery

## 2022-07-03 ENCOUNTER — Encounter (HOSPITAL_COMMUNITY)
Admission: RE | Disposition: A | Discharge status: Home / Self Care | Payer: Self-pay | Source: Home / Self Care | Attending: Vascular Surgery

## 2022-07-03 ENCOUNTER — Other Ambulatory Visit: Payer: Self-pay

## 2022-07-03 DIAGNOSIS — Z79899 Other long term (current) drug therapy: Secondary | ICD-10-CM | POA: Diagnosis not present

## 2022-07-03 DIAGNOSIS — J449 Chronic obstructive pulmonary disease, unspecified: Secondary | ICD-10-CM | POA: Insufficient documentation

## 2022-07-03 DIAGNOSIS — I2510 Atherosclerotic heart disease of native coronary artery without angina pectoris: Secondary | ICD-10-CM | POA: Insufficient documentation

## 2022-07-03 DIAGNOSIS — E039 Hypothyroidism, unspecified: Secondary | ICD-10-CM | POA: Diagnosis not present

## 2022-07-03 DIAGNOSIS — I739 Peripheral vascular disease, unspecified: Secondary | ICD-10-CM | POA: Diagnosis present

## 2022-07-03 DIAGNOSIS — Z87891 Personal history of nicotine dependence: Secondary | ICD-10-CM | POA: Diagnosis not present

## 2022-07-03 DIAGNOSIS — E119 Type 2 diabetes mellitus without complications: Secondary | ICD-10-CM | POA: Diagnosis not present

## 2022-07-03 DIAGNOSIS — I70212 Atherosclerosis of native arteries of extremities with intermittent claudication, left leg: Secondary | ICD-10-CM | POA: Diagnosis present

## 2022-07-03 HISTORY — PX: PERIPHERAL VASCULAR INTERVENTION: CATH118257

## 2022-07-03 HISTORY — PX: ABDOMINAL AORTOGRAM W/LOWER EXTREMITY: CATH118223

## 2022-07-03 LAB — POCT I-STAT, CHEM 8
BUN: 32 mg/dL — ABNORMAL HIGH (ref 8–23)
Calcium, Ion: 1.15 mmol/L (ref 1.15–1.40)
Chloride: 105 mmol/L (ref 98–111)
Creatinine, Ser: 1.4 mg/dL — ABNORMAL HIGH (ref 0.61–1.24)
Glucose, Bld: 167 mg/dL — ABNORMAL HIGH (ref 70–99)
HCT: 46 % (ref 39.0–52.0)
Hemoglobin: 15.6 g/dL (ref 13.0–17.0)
Potassium: 4.1 mmol/L (ref 3.5–5.1)
Sodium: 141 mmol/L (ref 135–145)
TCO2: 25 mmol/L (ref 22–32)

## 2022-07-03 LAB — GLUCOSE, CAPILLARY
Glucose-Capillary: 212 mg/dL — ABNORMAL HIGH (ref 70–99)
Glucose-Capillary: 195 mg/dL — ABNORMAL HIGH (ref 70–99)

## 2022-07-03 SURGERY — ABDOMINAL AORTOGRAM W/LOWER EXTREMITY
Anesthesia: LOCAL

## 2022-07-03 MED ORDER — LIDOCAINE HCL (PF) 1 % IJ SOLN
INTRAMUSCULAR | Status: AC
  Filled 2022-07-03: qty 30

## 2022-07-03 MED ORDER — LEVOTHYROXINE SODIUM 25 MCG PO TABS
137.0000 ug | ORAL_TABLET | Freq: Every day | ORAL | Status: DC
  Administered 2022-07-04: 137 ug via ORAL
  Filled 2022-07-03: qty 1

## 2022-07-03 MED ORDER — PANTOPRAZOLE SODIUM 40 MG PO TBEC
40.0000 mg | DELAYED_RELEASE_TABLET | Freq: Two times a day (BID) | ORAL | Status: DC
  Administered 2022-07-04: 40 mg via ORAL
  Filled 2022-07-03: qty 1

## 2022-07-03 MED ORDER — MIDAZOLAM HCL 2 MG/2ML IJ SOLN
INTRAMUSCULAR | Status: DC | PRN
  Administered 2022-07-03: 1 mg via INTRAVENOUS

## 2022-07-03 MED ORDER — IODIXANOL 320 MG/ML IV SOLN
INTRAVENOUS | Status: DC | PRN
  Administered 2022-07-03: 55 mL

## 2022-07-03 MED ORDER — HEPARIN SODIUM (PORCINE) 1000 UNIT/ML IJ SOLN
INTRAMUSCULAR | Status: AC
  Filled 2022-07-03: qty 10

## 2022-07-03 MED ORDER — ACETAMINOPHEN 325 MG PO TABS
ORAL_TABLET | ORAL | Status: AC
  Filled 2022-07-03: qty 2

## 2022-07-03 MED ORDER — METOPROLOL SUCCINATE ER 50 MG PO TB24
50.0000 mg | ORAL_TABLET | Freq: Every day | ORAL | Status: DC
  Administered 2022-07-04: 50 mg via ORAL
  Filled 2022-07-03: qty 1

## 2022-07-03 MED ORDER — SODIUM CHLORIDE 0.9 % IV SOLN: INTRAVENOUS | Status: DC

## 2022-07-03 MED ORDER — ZOLPIDEM TARTRATE 5 MG PO TABS
5.0000 mg | ORAL_TABLET | Freq: Every evening | ORAL | Status: DC | PRN
  Administered 2022-07-03: 5 mg via ORAL
  Filled 2022-07-03: qty 1

## 2022-07-03 MED ORDER — SODIUM CHLORIDE 0.9 % IV SOLN: 250.0000 mL | INTRAVENOUS | Status: DC | PRN

## 2022-07-03 MED ORDER — CLOPIDOGREL BISULFATE 75 MG PO TABS: 75.0000 mg | ORAL_TABLET | Freq: Every day | ORAL | Status: DC

## 2022-07-03 MED ORDER — ASPIRIN 81 MG PO TBEC
81.0000 mg | DELAYED_RELEASE_TABLET | Freq: Every day | ORAL | Status: DC
  Administered 2022-07-04: 81 mg via ORAL
  Filled 2022-07-03: qty 1

## 2022-07-03 MED ORDER — SODIUM CHLORIDE 0.9% FLUSH
3.0000 mL | Freq: Two times a day (BID) | INTRAVENOUS | Status: DC
  Administered 2022-07-03: 3 mL via INTRAVENOUS

## 2022-07-03 MED ORDER — CLOPIDOGREL BISULFATE 75 MG PO TABS
75.0000 mg | ORAL_TABLET | Freq: Every day | ORAL | Status: DC
  Administered 2022-07-04: 75 mg via ORAL
  Filled 2022-07-03: qty 1

## 2022-07-03 MED ORDER — FLUTICASONE PROPIONATE 50 MCG/ACT NA SUSP: 1.0000 | Freq: Two times a day (BID) | NASAL | Status: DC | PRN

## 2022-07-03 MED ORDER — ACETAMINOPHEN 325 MG PO TABS
650.0000 mg | ORAL_TABLET | ORAL | Status: DC | PRN
  Administered 2022-07-03: 650 mg via ORAL

## 2022-07-03 MED ORDER — AMLODIPINE-OLMESARTAN 5-40 MG PO TABS: 1.0000 | ORAL_TABLET | Freq: Every day | ORAL | Status: DC

## 2022-07-03 MED ORDER — METHOCARBAMOL 500 MG PO TABS: 500.0000 mg | ORAL_TABLET | Freq: Three times a day (TID) | ORAL | Status: DC | PRN

## 2022-07-03 MED ORDER — SODIUM CHLORIDE 0.9% FLUSH: 3.0000 mL | INTRAVENOUS | Status: DC | PRN

## 2022-07-03 MED ORDER — FENTANYL CITRATE (PF) 100 MCG/2ML IJ SOLN
INTRAMUSCULAR | Status: DC | PRN
  Administered 2022-07-03: 50 ug via INTRAVENOUS

## 2022-07-03 MED ORDER — ONDANSETRON HCL 4 MG/2ML IJ SOLN: 4.0000 mg | Freq: Four times a day (QID) | INTRAMUSCULAR | Status: DC | PRN

## 2022-07-03 MED ORDER — LABETALOL HCL 5 MG/ML IV SOLN: 10.0000 mg | INTRAVENOUS | Status: DC | PRN

## 2022-07-03 MED ORDER — SODIUM CHLORIDE 0.9 % IV SOLN: INTRAVENOUS | Status: AC

## 2022-07-03 MED ORDER — BENZONATATE 100 MG PO CAPS: 200.0000 mg | ORAL_CAPSULE | Freq: Three times a day (TID) | ORAL | Status: DC | PRN

## 2022-07-03 MED ORDER — ROSUVASTATIN CALCIUM 5 MG PO TABS
10.0000 mg | ORAL_TABLET | Freq: Every day | ORAL | Status: DC
  Administered 2022-07-04: 10 mg via ORAL
  Filled 2022-07-03: qty 1
  Filled 2022-07-03: qty 2

## 2022-07-03 MED ORDER — LIDOCAINE HCL (PF) 1 % IJ SOLN
INTRAMUSCULAR | Status: DC | PRN
  Administered 2022-07-03: 10 mL

## 2022-07-03 MED ORDER — CLOPIDOGREL BISULFATE 75 MG PO TABS: 300.0000 mg | ORAL_TABLET | Freq: Once | ORAL | Status: DC

## 2022-07-03 MED ORDER — HYDRALAZINE HCL 20 MG/ML IJ SOLN: 5.0000 mg | INTRAMUSCULAR | Status: DC | PRN

## 2022-07-03 MED ORDER — HEPARIN SODIUM (PORCINE) 1000 UNIT/ML IJ SOLN
INTRAMUSCULAR | Status: DC | PRN
  Administered 2022-07-03: 5000 [IU] via INTRAVENOUS

## 2022-07-03 MED ORDER — HEPARIN (PORCINE) IN NACL 1000-0.9 UT/500ML-% IV SOLN
INTRAVENOUS | Status: DC | PRN
  Administered 2022-07-03 (×2): 500 mL

## 2022-07-03 MED ORDER — AMLODIPINE BESYLATE 5 MG PO TABS
5.0000 mg | ORAL_TABLET | Freq: Every day | ORAL | Status: DC
  Administered 2022-07-04: 5 mg via ORAL
  Filled 2022-07-03: qty 1

## 2022-07-03 MED ORDER — MIDAZOLAM HCL 2 MG/2ML IJ SOLN
INTRAMUSCULAR | Status: AC
  Filled 2022-07-03: qty 2

## 2022-07-03 MED ORDER — VITAMIN D 25 MCG (1000 UNIT) PO TABS
1000.0000 [IU] | ORAL_TABLET | Freq: Every day | ORAL | Status: DC
  Administered 2022-07-04: 1000 [IU] via ORAL
  Filled 2022-07-03: qty 1

## 2022-07-03 MED ORDER — HEPARIN (PORCINE) IN NACL 1000-0.9 UT/500ML-% IV SOLN
INTRAVENOUS | Status: AC
  Filled 2022-07-03: qty 1000

## 2022-07-03 MED ORDER — HEPARIN SODIUM (PORCINE) 5000 UNIT/ML IJ SOLN
5000.0000 [IU] | Freq: Three times a day (TID) | INTRAMUSCULAR | Status: DC
  Administered 2022-07-03 – 2022-07-04 (×2): 5000 [IU] via SUBCUTANEOUS
  Filled 2022-07-03 (×2): qty 1

## 2022-07-03 MED ORDER — IRBESARTAN 150 MG PO TABS
300.0000 mg | ORAL_TABLET | Freq: Every day | ORAL | Status: DC
  Administered 2022-07-04: 300 mg via ORAL
  Filled 2022-07-03: qty 2

## 2022-07-03 MED ORDER — CLOPIDOGREL BISULFATE 300 MG PO TABS
ORAL_TABLET | ORAL | Status: DC | PRN
  Administered 2022-07-03: 300 mg via ORAL

## 2022-07-03 MED ORDER — SEMAGLUTIDE 14 MG PO TABS: 14.0000 mg | ORAL_TABLET | Freq: Every day | ORAL | Status: DC

## 2022-07-03 MED ORDER — CLOPIDOGREL BISULFATE 300 MG PO TABS
ORAL_TABLET | ORAL | Status: AC
  Filled 2022-07-03: qty 1

## 2022-07-03 MED ORDER — FENTANYL CITRATE (PF) 100 MCG/2ML IJ SOLN
INTRAMUSCULAR | Status: AC
  Filled 2022-07-03: qty 2

## 2022-07-03 SURGICAL SUPPLY — 22 items
BALLN MUSTANG 6X150X135 (BALLOONS) ×2
BALLOON MUSTANG 6X150X135 (BALLOONS) IMPLANT
CATH OMNI FLUSH 5F 65CM (CATHETERS) IMPLANT
CATH SOFT-VU 4F 65 STRAIGHT (CATHETERS) IMPLANT
CATH SOFT-VU STRAIGHT 4F 65CM (CATHETERS) ×2
CATH SYNTRAX .035X150 (CATHETERS) IMPLANT
CLOSURE MYNX CONTROL 6F/7F (Vascular Products) IMPLANT
GLIDEWIRE ADV .035X260CM (WIRE) IMPLANT
GUIDEWIRE ANGLED .035X150CM (WIRE) IMPLANT
KIT ENCORE 26 ADVANTAGE (KITS) IMPLANT
KIT MICROPUNCTURE NIT STIFF (SHEATH) IMPLANT
KIT PV (KITS) ×3 IMPLANT
SHEATH CATAPULT 6F 45 RDC (SHEATH) IMPLANT
SHEATH PINNACLE 5F 10CM (SHEATH) IMPLANT
SHEATH PINNACLE 6F 10CM (SHEATH) IMPLANT
SHEATH PROBE COVER 6X72 (BAG) IMPLANT
STENT ELUVIA 6X100X130 (Permanent Stent) IMPLANT
STENT ELUVIA 6X150X130 (Permanent Stent) IMPLANT
SYR MEDRAD MARK V 150ML (SYRINGE) IMPLANT
TRANSDUCER W/STOPCOCK (MISCELLANEOUS) ×3 IMPLANT
TRAY PV CATH (CUSTOM PROCEDURE TRAY) ×3 IMPLANT
WIRE BENTSON .035X145CM (WIRE) IMPLANT

## 2022-07-03 NOTE — Op Note (Signed)
    Patient name: Jeff Lopez MRN: [id number] DOB: December 27, 1950 Sex: male  07/03/2022 Pre-operative Diagnosis: Left lower extremity life-limiting claudication Post-operative diagnosis:  Same Surgeon:  Erlene Quan C. Donzetta Matters, MD Procedure Performed: 1.  Ultrasound-guided cannulation right common femoral artery 2.  Aortogram with left lower extremity angiogram 3.  Stent of left SFA with distal 6 x 150 mm Eluvia and proximal 6 x 100 mm Eluvia 4.  Moderate sedation with fentanyl and Versed for 45 minutes 5.  Mynx device closure right common femoral artery  Indications: 72 year old male has a history of new onset claudication which began around October of last year.  He had monophasic waveforms throughout the left lower extremity consistent with atherosclerotic disease causing claudication also has history of significant spine surgery and may be mixed neurogenic and vascular symptoms.  We have discussed proceeding with angiography and only his left leg was evaluated given elevated creatinine.  Findings: The aorta and iliac segments of free of flow-limiting stenosis however visualization is somewhat precluded by significant spinal instrumentation.  Bilateral renal arteries do appear patent.  The SFA on the left after the common femoral was patent appears proximally to have 20% stenosis and then after this has areas of multiple 50% stenosis in 1 area of greater than 90% stenosis.  After stenting of all of the highly stenosed areas these were reduced to 0% residual and there is runoff via the anterior tibial which is a high takeoff and posterior tibial arteries.  He has a palpable posterior tibial artery at the ankle at completion.  Plan will be to follow-up in 4 to 6 weeks with bilateral lower extremity arterial duplex to evaluate for right lower extremity occlusive disease and evaluate our recent intervention.   Procedure:  The patient was identified in the holding area and taken to room 8.  The patient was  then placed supine on the table and prepped and draped in the usual sterile fashion.  A time out was called.  Ultrasound was used to evaluate the right common femoral artery which was noted to be patent.  The area was anesthetized with 1% lidocaine cannulated with a micropuncture needle followed by wire and sheath.  And images saved the permanent record.  We placed a 5 French sheath followed by Omni catheter to the level of L1 and performed aortogram.  Crossing bifurcation required a Glidewire and a straight catheter we performed left lower extremity angiography with the above findings.  We then placed a Glidewire advantage followed by a long 6 French sheath into the SFA on the left and the patient was given 5000 units of heparin.  We then primarily stented the SFA with distal 150 mm long stent and proximal 100 mm long stent which were drug-eluting stents.  These were postdilated with 6 mm balloon.  Completion demonstrated no residual stenosis and brisk runoff via the posterior tibial and anterior tibial arteries.  Satisfied with this we exchanged for a short 6 French sheath and applied a minx device.  Patient tolerated procedure well without immediate complication.  He did have palpable posterior tibial pulse at the ankle at completion.  Contrast: 55cc  Alecsander Hattabaugh C. Donzetta Matters, MD [facility] Office: [phone] Pager: [phone]

## 2022-07-03 NOTE — H&P (Signed)
HPI:   Jeff Lopez is a 72 y.o. male without significant vascular disease does have a history of coronary artery disease as well as diabetes and is a longtime former smoker quit 20 years ago.  Also has hypertension and hyperlipidemia.  He does not take any antiplatelet agents but has tolerated aspirin in the past.  States that 3 months ago he was able to walk up to 3 miles and then suddenly got to where he could not walk more than 20 feet.  He is also having cramping in the middle the night requiring him to walk through the house to resolve.  He is not currently experiencing rest pain denies any tissue loss or ulceration.  No history of stroke, TIA or amaurosis and no personal or family history of aneurysm disease.       Past Medical History:  Diagnosis Date   Allergy      per pt   CAD, NATIVE VESSEL 04/19/2010    nonobstructive by cath 2007:  oLAD 20-30%, mLAD 50%, pCFX 20-30%, oAVCFX 20-30%, L renal art 50%;  normal LVF   Cataract      bil cataracts removed   COPD (chronic obstructive pulmonary disease) ([place])     Diabetes mellitus without complication ([place])     GERD 04/09/2007   Headache(784.0)     HYPERLIPIDEMIA 04/09/2007    Patient denies.   HYPERTENSION, BENIGN 04/19/2010   Hypothyroidism     Hypothyroidism      previous hyperthyroidism, s/p I-131   LUMBAR DISC DISORDER 05/27/2010   Nerve damage      Left leg   OSTEOARTHRITIS, LUMBAR SPINE 04/09/2007   RENAL CYST 05/27/2010   Spinal headache      After having back surgery in 2014         Family History  Problem Relation Age of Onset   Breast cancer Mother     Uterine cancer Mother     Heart disease Mother     Heart attack Mother 77   Prostate cancer Father     Hypertension Brother     Non-Hodgkin's lymphoma Brother     Stomach cancer Paternal Grandmother     Thyroid disease Neg Hx     Colon cancer Neg Hx     Esophageal cancer Neg Hx     Rectal cancer Neg Hx     Pancreatic cancer Neg Hx     Colon polyps  Neg Hx           Past Surgical History:  Procedure Laterality Date   BACK SURGERY   2012,2014   CARDIAC CATHETERIZATION   2007    Dr Loanne Drilling   CHOLECYSTECTOMY       CHOLECYSTECTOMY N/A 07/13/2013    Procedure: LAPAROSCOPIC CHOLECYSTECTOMY WITH INTRAOPERATIVE CHOLANGIOGRAM;  Surgeon: Shann Medal, MD;  Location: [facility];  Service: General;  Laterality: N/A;   COLONOSCOPY   2018   COLONOSCOPY W/ POLYPECTOMY       ERCP N/A 07/14/2013    Procedure: ENDOSCOPIC RETROGRADE CHOLANGIOPANCREATOGRAPHY (ERCP);  Surgeon: [name], MD;  Location: [facility];  Service: Endoscopy;  Laterality: N/A;   EYE SURGERY Bilateral      Cataract removal   FOOT FRACTURE SURGERY       FRACTURE SURGERY       LUMBAR [place] SURGERY   08/06/2012    L3 & L4   LUMBAR DISC SURGERY   11/11/2015    L1 L2    DR  NITKA   LUMBAR FUSION N/A 04/20/2015    Procedure: T12 to L1 fusion (Extension of Previous Fusion L2-S1 to T12-S1), Right Transforaminal lumbar interbody fusion, Posterior Fusion T12 to L1, with Pedicle screws, allograft, local bone graft, and  Vivigen;  Surgeon: [name], MD;  Location: [facility];  Service: Orthopedics;  Laterality: N/A;   LUMBAR LAMINECTOMY N/A 11/10/2013    Procedure: Left L1-2 far lateral approach to excise herniated nucleus pulposus;  Surgeon: [name], MD;  Location: [facility];  Service: Orthopedics;  Laterality: N/A;   LUMBAR LAMINECTOMY/DECOMPRESSION MICRODISCECTOMY Left 08/10/2012    Procedure: Dura Repair Left Side L2-L3;  Surgeon: [name], MD;  Location: [facility];  Service: Orthopedics;  Laterality: Left;  Wilson Frame, Sliding table, dura repair kit, microscope   NECK SURGERY        X 2   SINOSCOPY       SPINE SURGERY   2006    C-spine surgery x 2   UPPER GASTROINTESTINAL ENDOSCOPY          Short Social History:  Social History         Tobacco Use   Smoking status: Former      Packs/day: 1.00      Years: 37.00      Total pack years: 37.00      Types: Cigarettes       Quit date: 04/06/2008      Years since quitting: 14.0   Smokeless tobacco: Never   Tobacco comments:      pt has stopped smoking about 9 months now  Substance Use Topics   Alcohol use: No           Allergies  Allergen Reactions   Ceftin Other (See Comments)      Patient stated it caused "sores in mouth" Thrush   Cefuroxime Axetil Other (See Comments)      Sores in mouth Sores in mouth Patient stated it caused "sores in mouth" Thrush other   Cephalexin Other (See Comments)      Other            Current Outpatient Medications  Medication Sig Dispense Refill   amLODipine-olmesartan (AZOR) 5-40 MG tablet Take 1 tablet by mouth daily. 90 tablet 0   cholecalciferol (VITAMIN D3) 25 MCG (1000 UT) tablet Take 1,000 Units by mouth daily.       Eszopiclone 3 MG TABS Take 1 tablet (3 mg total) by mouth at bedtime. Take immediately before bedtime 30 tablet 3   levothyroxine (SYNTHROID) 137 MCG tablet Take 137 mcg by mouth every morning.       metoprolol succinate (TOPROL-XL) 50 MG 24 hr tablet TAKE 1 TABLET BY MOUTH EVERY DAY WITH OR IMMEDIATELY FOLLOWING A MEAL (Patient taking differently: Take 25 mg by mouth daily.) 90 tablet 3   pantoprazole (PROTONIX) 40 MG tablet Take 1 tablet (40 mg total) by mouth 2 (two) times daily before a meal. 180 tablet 3   Semaglutide (RYBELSUS) 14 MG TABS Take 1 tablet (14 mg total) by mouth daily. 30 tablet 3    No current facility-administered medications for this visit.      Review of Systems  Constitutional:  Constitutional negative. HENT: HENT negative.  Eyes: Eyes negative.  Cardiovascular: Positive for claudication.  GI: Gastrointestinal negative.  Musculoskeletal: Positive for gait problem and leg pain.  Skin: Skin negative.  Hematologic: Hematologic/lymphatic negative.  Psychiatric: Psychiatric negative.          Objective:  Vitals:   07/03/22 0701  BP: (!) 140/84  Pulse: 74  Resp: 17  Temp: (!) 97.5 F (36.4 C)  SpO2: 91%       Physical Exam HENT:     Head: Normocephalic.     Nose: Nose normal.  Eyes:     Pupils: Pupils are equal, round, and reactive to light.  Neck:     Vascular: No carotid bruit.  Cardiovascular:     Rate and Rhythm: Normal rate.     Pulses:          Femoral pulses are 2+ on the right side and 2+ on the left side.      Popliteal pulses are 0 on the right side and 0 on the left side.     Heart sounds: Normal heart sounds.  Pulmonary:     Effort: Pulmonary effort is normal.  Abdominal:     General: Abdomen is flat.     Palpations: Abdomen is soft.  Musculoskeletal:     Right lower leg: No edema.     Left lower leg: No edema.  Skin:    General: Skin is warm and dry.     Capillary Refill: Capillary refill takes more than 3 seconds.  Neurological:     General: No focal deficit present.     Mental Status: He is alert.  Psychiatric:        Mood and Affect: Mood normal.        Thought Content: Thought content normal.        Data: ABI Findings:  +---------+------------------+-----+-----------+--------+  Right    Rt Pressure (mmHg)IndexWaveform   Comment   +---------+------------------+-----+-----------+--------+  Brachial 145                                         +---------+------------------+-----+-----------+--------+  PTA      152               1.00 monophasic           +---------+------------------+-----+-----------+--------+  DP       148               0.97 multiphasic          +---------+------------------+-----+-----------+--------+  Great Toe107               0.70 Normal               +---------+------------------+-----+-----------+--------+   +---------+------------------+-----+-------------------+-------+  Left     Lt Pressure (mmHg)IndexWaveform           Comment  +---------+------------------+-----+-------------------+-------+  Brachial 152                                                 +---------+------------------+-----+-------------------+-------+  PTA      123               0.81 monophasic                  +---------+------------------+-----+-------------------+-------+  DP       124               0.82 dampened monophasic         +---------+------------------+-----+-------------------+-------+  Quillian Quince  0.49 Abnormal                    +---------+------------------+-----+-------------------+-------+   +-------+-----------+-----------+------------+------------+  ABI/TBIToday's ABIToday's TBIPrevious ABIPrevious TBI  +-------+-----------+-----------+------------+------------+  Right  1.0        0.70                                 +-------+-----------+-----------+------------+------------+  Left   0.82       0.49                                 +-------+-----------+-----------+------------+------------+           Assessment/Plan:    72 year old male with no acute onset of very short distance life limiting claudication that began approximately 3 months ago and now prevents him from walking even through the grocery store more than 200 feet where previously he was walking 3 miles.  He does not have any palpable pulses below the common femorals on the left side has severely diminished capillary refill.  He does have some evidence of rest pain although claudication appears to be his most significant symptom at this time.  Given the acute nature and the light limitation of his disease process we will begin with angiography from the right common femoral approach.  We have discussed possible intervention including stenting or balloon angioplasty possible need for bypass surgery and he demonstrates good understanding.  Plan for procedure today. All questions answered.          Waynetta Sandy MD [facility]

## 2022-07-04 DIAGNOSIS — I70212 Atherosclerosis of native arteries of extremities with intermittent claudication, left leg: Secondary | ICD-10-CM | POA: Diagnosis not present

## 2022-07-04 LAB — CBC
HCT: 42.2 % (ref 39.0–52.0)
Hemoglobin: 14.7 g/dL (ref 13.0–17.0)
MCH: 33.3 pg (ref 26.0–34.0)
MCHC: 34.8 g/dL (ref 30.0–36.0)
MCV: 95.7 fL (ref 80.0–100.0)
Platelets: 163 10*3/uL (ref 150–400)
RBC: 4.41 MIL/uL (ref 4.22–5.81)
RDW: 11.9 % (ref 11.5–15.5)
WBC: 7 10*3/uL (ref 4.0–10.5)
nRBC: 0 % (ref 0.0–0.2)

## 2022-07-04 LAB — LIPID PANEL
Cholesterol: 142 mg/dL (ref 0–200)
HDL: 34 mg/dL — ABNORMAL LOW (ref >40)
LDL Cholesterol: 73 mg/dL (ref 0–99)
Total CHOL/HDL Ratio: 4.2 RATIO
Triglycerides: 173 mg/dL — ABNORMAL HIGH (ref <150)
VLDL: 35 mg/dL (ref 0–40)

## 2022-07-04 LAB — BASIC METABOLIC PANEL
Anion gap: 12 (ref 5–15)
BUN: 26 mg/dL — ABNORMAL HIGH (ref 8–23)
CO2: 20 mmol/L — ABNORMAL LOW (ref 22–32)
Calcium: 8.3 mg/dL — ABNORMAL LOW (ref 8.9–10.3)
Chloride: 105 mmol/L (ref 98–111)
Creatinine, Ser: 1.37 mg/dL — ABNORMAL HIGH (ref 0.61–1.24)
GFR, Estimated: 55 mL/min — ABNORMAL LOW (ref >60)
Glucose, Bld: 154 mg/dL — ABNORMAL HIGH (ref 70–99)
Potassium: 3.6 mmol/L (ref 3.5–5.1)
Sodium: 137 mmol/L (ref 135–145)

## 2022-07-04 MED ORDER — CLOPIDOGREL BISULFATE 75 MG PO TABS: 75.0000 mg | ORAL_TABLET | Freq: Every day | ORAL | 3 refills | Status: DC

## 2022-07-04 MED ORDER — ROSUVASTATIN CALCIUM 10 MG PO TABS: 10.0000 mg | ORAL_TABLET | Freq: Every day | ORAL | 11 refills | Status: DC

## 2022-07-04 NOTE — Progress Notes (Deleted)
[facility] Discharge Summary   Patient ID:  Jeff Lopez MRN: [id number] DOB/AGE: Jun 12, 1951 72 y.o.  Admit date: 07/03/2022 Discharge date: 07/04/2022 Date of Surgery: 07/03/2022 Surgeon: Surgeon(s): Waynetta Sandy, MD  Admission Diagnosis: PAD (peripheral artery disease) [facility]) [I73.9]  Discharge Diagnoses:  PAD (peripheral artery disease) ([place]) [I73.9]  Secondary Diagnoses: Past Medical History:  Diagnosis Date   Allergy    per pt   CAD, NATIVE VESSEL 04/19/2010   nonobstructive by cath 2007:  oLAD 20-30%, mLAD 50%, pCFX 20-30%, oAVCFX 20-30%, L renal art 50%;  normal LVF   Cataract    bil cataracts removed   COPD (chronic obstructive pulmonary disease) ([place])    Diabetes mellitus without complication ([place])    GERD 04/09/2007   Headache(784.0)    HYPERLIPIDEMIA 04/09/2007   Patient denies.   HYPERTENSION, BENIGN 04/19/2010   Hypothyroidism    Hypothyroidism    previous hyperthyroidism, s/p I-131   LUMBAR DISC DISORDER 05/27/2010   Nerve damage    Left leg   OSTEOARTHRITIS, LUMBAR SPINE 04/09/2007   RENAL CYST 05/27/2010   Spinal headache    After having back surgery in 2014    Procedure(s): ABDOMINAL AORTOGRAM W/LOWER EXTREMITY PERIPHERAL VASCULAR INTERVENTION  Discharged Condition: good  Indication: 72 year old male has a history of new onset claudication which began around October of last year. He had monophasic waveforms throughout the left lower extremity consistent with atherosclerotic disease causing claudication also has history of significant spine surgery and may be mixed neurogenic and vascular symptoms. We have discussed proceeding with angiography and only his left leg was evaluated given elevated creatinine.   Hospital Course:  Jeff Lopez is a 72 y.o. male is S/P Aortogram, Arteriogram Left lower extremity with stent of left SFA distally and proximally with Eluvia stents. He tolerated the procedure well and was  taken to recover room in stable condition. His bilateral lower extremities remained well perfused with palpable DP pulses bilaterally. His right common femoral access site without swelling or hematoma. No post operative pain. He remained stable for discharge home POD#1. Prescriptions for Plavix and Rosuvastatin were sent to his requested pharmacy. He will have follow up arranged in 4-6 weeks with bilateral lower extremity arterial duplex.   Procedure(s): ABDOMINAL AORTOGRAM W/LOWER EXTREMITY PERIPHERAL VASCULAR INTERVENTION Post-op right femoral access site clean, dry, intact Pt. Ambulating, voiding and taking PO diet without difficulty. Pt pain controlled with PO pain meds. Labs as below Complications: none  Consults: none   Significant Diagnostic Studies: CBC Lab Results  Component Value Date   WBC 7.0 07/04/2022   HGB 14.7 07/04/2022   HCT 42.2 07/04/2022   MCV 95.7 07/04/2022   PLT 163 07/04/2022    BMET    Component Value Date/Time   NA 137 07/04/2022 0223   NA 140 07/05/2017 1024   K 3.6 07/04/2022 0223   CL 105 07/04/2022 0223   CO2 20 (L) 07/04/2022 0223   GLUCOSE 154 (H) 07/04/2022 0223   BUN 26 (H) 07/04/2022 0223   BUN 19 07/05/2017 1024   CREATININE 1.37 (H) 07/04/2022 0223   CREATININE 1.17 04/23/2020 0914   CALCIUM 8.3 (L) 07/04/2022 0223   GFRNONAA 55 (L) 07/04/2022 0223   GFRAA 67 07/05/2017 1024   COAG Lab Results  Component Value Date   INR 0.86 01/25/2011     Disposition:  Discharge to :Home Discharge Instructions     Call MD for:  redness, tenderness, or signs of infection (pain, swelling, bleeding,  redness, odor or green/yellow discharge around incision site)   Complete by: As directed    Call MD for:  severe or increased pain, loss or decreased feeling  in affected limb(s)   Complete by: As directed    Call MD for:  temperature >100.5   Complete by: As directed    Discharge patient   Complete by: As directed    Discharge disposition:  01-Home or Self Care   Discharge patient date: 07/04/2022   Discharge wound care:   Complete by: As directed    Okay to shower. Wash right groin area with mild soap and water, pat dry. Do not soak in bathtub   Driving Restrictions   Complete by: As directed    No restrictions   Increase activity slowly   Complete by: As directed    Walk with assistance use walker or cane as needed   Lifting restrictions   Complete by: As directed    No heavy lifting, pushing, pulling >10 lbs for 1 week   Resume previous diet   Complete by: As directed       Allergies as of 07/04/2022       Reactions   Ceftin Other (See Comments)   Patient stated it caused "sores in mouth" Thrush   Cefuroxime Axetil Other (See Comments)   sores in mouth Patient stated it caused "sores in mouth" Thrush   Cephalexin Other (See Comments)   Other        Medication List     TAKE these medications    amLODipine-olmesartan 5-40 MG tablet Commonly known as: AZOR TAKE 1 TABLET BY MOUTH EVERY DAY   aspirin EC 81 MG tablet Take 81 mg by mouth daily. Swallow whole.   benzonatate 200 MG capsule Commonly known as: TESSALON Take 1 capsule (200 mg total) by mouth 3 (three) times daily as needed for cough.   cholecalciferol 25 MCG (1000 UNIT) tablet Commonly known as: VITAMIN D3 Take 1,000 Units by mouth daily.   clopidogrel 75 MG tablet Commonly known as: PLAVIX Take 1 tablet (75 mg total) by mouth daily.   eszopiclone 2 MG Tabs tablet Commonly known as: LUNESTA Take 1 tablet (2 mg total) by mouth at bedtime as needed for sleep. Take immediately before bedtime What changed:  when to take this additional instructions   fluticasone 50 MCG/ACT nasal spray Commonly known as: FLONASE Place 1 spray into both nostrils 2 (two) times daily as needed for rhinitis or allergies.   levothyroxine 137 MCG tablet Commonly known as: SYNTHROID TAKE 1 TABLET BY MOUTH EVERY DAY BEFORE BREAKFAST   methocarbamol 500 MG  tablet Commonly known as: ROBAXIN TAKE 1 TABLET BY MOUTH EVERY 8 HOURS AS NEEDED FOR MUSCLE SPASMS   metoprolol succinate 50 MG 24 hr tablet Commonly known as: TOPROL-XL TAKE 1 TABLET BY MOUTH EVERY DAY WITH OR IMMEDIATELY FOLLOWING A MEAL What changed: See the new instructions.   pantoprazole 40 MG tablet Commonly known as: PROTONIX Take 1 tablet (40 mg total) by mouth 2 (two) times daily before a meal.   rosuvastatin 10 MG tablet Commonly known as: CRESTOR Take 1 tablet (10 mg total) by mouth daily.   Rybelsus 14 MG Tabs Generic drug: Semaglutide Take 1 tablet (14 mg total) by mouth daily.               Discharge Care Instructions  (From admission, onward)           Start     Ordered  07/04/22 0000  Discharge wound care:       Comments: Okay to shower. Wash right groin area with mild soap and water, pat dry. Do not soak in bathtub   07/04/22 0737           Verbal and written Discharge instructions given to the patient. Wound care per Discharge AVS  Follow-up Information     [facility] Follow up.   Why: 4-6 weeks. The office will call the patient with an appointment Contact information: [place] [place] [phone]                Signed: Karoline Caldwell 07/04/2022, 3:24 PM

## 2022-07-04 NOTE — Plan of Care (Signed)

## 2022-07-04 NOTE — Discharge Summary (Signed)
[facility] Discharge Summary   Patient ID:  Jeff Lopez MRN: [id number] DOB/AGE: 28-Dec-1950 72 y.o.  Admit date: 07/03/2022 Discharge date: 07/04/2022 Date of Surgery: 07/03/2022 Surgeon: Surgeon(s): Waynetta Sandy, MD  Admission Diagnosis: PAD (peripheral artery disease) [facility]) [I73.9]  Discharge Diagnoses:  PAD (peripheral artery disease) ([place]) [I73.9]  Secondary Diagnoses: Past Medical History:  Diagnosis Date   Allergy    per pt   CAD, NATIVE VESSEL 04/19/2010   nonobstructive by cath 2007:  oLAD 20-30%, mLAD 50%, pCFX 20-30%, oAVCFX 20-30%, L renal art 50%;  normal LVF   Cataract    bil cataracts removed   COPD (chronic obstructive pulmonary disease) ([place])    Diabetes mellitus without complication ([place])    GERD 04/09/2007   Headache(784.0)    HYPERLIPIDEMIA 04/09/2007   Patient denies.   HYPERTENSION, BENIGN 04/19/2010   Hypothyroidism    Hypothyroidism    previous hyperthyroidism, s/p I-131   LUMBAR DISC DISORDER 05/27/2010   Nerve damage    Left leg   OSTEOARTHRITIS, LUMBAR SPINE 04/09/2007   RENAL CYST 05/27/2010   Spinal headache    After having back surgery in 2014    Procedure(s): ABDOMINAL AORTOGRAM W/LOWER EXTREMITY PERIPHERAL VASCULAR INTERVENTION  Discharged Condition: good  Indication: 72 year old male has a history of new onset claudication which began around October of last year. He had monophasic waveforms throughout the left lower extremity consistent with atherosclerotic disease causing claudication also has history of significant spine surgery and may be mixed neurogenic and vascular symptoms. We have discussed proceeding with angiography and only his left leg was evaluated given elevated creatinine.   Hospital Course:  Jeff Lopez is a 72 y.o. male is S/P Aortogram, Arteriogram Left lower extremity with stent of left SFA distally and proximally with Eluvia stents. He tolerated the procedure well and was  taken to recover room in stable condition. His bilateral lower extremities remained well perfused with palpable DP pulses bilaterally. His right common femoral access site without swelling or hematoma. No post operative pain. He remained stable for discharge home POD#1. Prescriptions for Plavix and Rosuvastatin were sent to his requested pharmacy. He will have follow up arranged in 4-6 weeks with bilateral lower extremity arterial duplex.   Procedure(s): ABDOMINAL AORTOGRAM W/LOWER EXTREMITY PERIPHERAL VASCULAR INTERVENTION Post-op right femoral access site clean, dry, intact Pt. Ambulating, voiding and taking PO diet without difficulty. Pt pain controlled with PO pain meds. Labs as below Complications: none  Consults: none   Significant Diagnostic Studies: CBC Lab Results  Component Value Date   WBC 7.0 07/04/2022   HGB 14.7 07/04/2022   HCT 42.2 07/04/2022   MCV 95.7 07/04/2022   PLT 163 07/04/2022    BMET    Component Value Date/Time   NA 137 07/04/2022 0223   NA 140 07/05/2017 1024   K 3.6 07/04/2022 0223   CL 105 07/04/2022 0223   CO2 20 (L) 07/04/2022 0223   GLUCOSE 154 (H) 07/04/2022 0223   BUN 26 (H) 07/04/2022 0223   BUN 19 07/05/2017 1024   CREATININE 1.37 (H) 07/04/2022 0223   CREATININE 1.17 04/23/2020 0914   CALCIUM 8.3 (L) 07/04/2022 0223   GFRNONAA 55 (L) 07/04/2022 0223   GFRAA 67 07/05/2017 1024   COAG Lab Results  Component Value Date   INR 0.86 01/25/2011     Disposition:  Discharge to :Home Discharge Instructions     Call MD for:  redness, tenderness, or signs of infection (pain, swelling, bleeding,  redness, odor or green/yellow discharge around incision site)   Complete by: As directed    Call MD for:  severe or increased pain, loss or decreased feeling  in affected limb(s)   Complete by: As directed    Call MD for:  temperature >100.5   Complete by: As directed    Discharge patient   Complete by: As directed    Discharge disposition:  01-Home or Self Care   Discharge patient date: 07/04/2022   Discharge wound care:   Complete by: As directed    Okay to shower. Wash right groin area with mild soap and water, pat dry. Do not soak in bathtub   Driving Restrictions   Complete by: As directed    No restrictions   Increase activity slowly   Complete by: As directed    Walk with assistance use walker or cane as needed   Lifting restrictions   Complete by: As directed    No heavy lifting, pushing, pulling >10 lbs for 1 week   Resume previous diet   Complete by: As directed       Allergies as of 07/04/2022       Reactions   Ceftin Other (See Comments)   Patient stated it caused "sores in mouth" Thrush   Cefuroxime Axetil Other (See Comments)   sores in mouth Patient stated it caused "sores in mouth" Thrush   Cephalexin Other (See Comments)   Other        Medication List     TAKE these medications    amLODipine-olmesartan 5-40 MG tablet Commonly known as: AZOR TAKE 1 TABLET BY MOUTH EVERY DAY   aspirin EC 81 MG tablet Take 81 mg by mouth daily. Swallow whole.   benzonatate 200 MG capsule Commonly known as: TESSALON Take 1 capsule (200 mg total) by mouth 3 (three) times daily as needed for cough.   cholecalciferol 25 MCG (1000 UNIT) tablet Commonly known as: VITAMIN D3 Take 1,000 Units by mouth daily.   clopidogrel 75 MG tablet Commonly known as: PLAVIX Take 1 tablet (75 mg total) by mouth daily.   eszopiclone 2 MG Tabs tablet Commonly known as: LUNESTA Take 1 tablet (2 mg total) by mouth at bedtime as needed for sleep. Take immediately before bedtime What changed:  when to take this additional instructions   fluticasone 50 MCG/ACT nasal spray Commonly known as: FLONASE Place 1 spray into both nostrils 2 (two) times daily as needed for rhinitis or allergies.   levothyroxine 137 MCG tablet Commonly known as: SYNTHROID TAKE 1 TABLET BY MOUTH EVERY DAY BEFORE BREAKFAST   methocarbamol 500 MG  tablet Commonly known as: ROBAXIN TAKE 1 TABLET BY MOUTH EVERY 8 HOURS AS NEEDED FOR MUSCLE SPASMS   metoprolol succinate 50 MG 24 hr tablet Commonly known as: TOPROL-XL TAKE 1 TABLET BY MOUTH EVERY DAY WITH OR IMMEDIATELY FOLLOWING A MEAL What changed: See the new instructions.   pantoprazole 40 MG tablet Commonly known as: PROTONIX Take 1 tablet (40 mg total) by mouth 2 (two) times daily before a meal.   rosuvastatin 10 MG tablet Commonly known as: CRESTOR Take 1 tablet (10 mg total) by mouth daily.   Rybelsus 14 MG Tabs Generic drug: Semaglutide Take 1 tablet (14 mg total) by mouth daily.               Discharge Care Instructions  (From admission, onward)           Start     Ordered  07/04/22 0000  Discharge wound care:       Comments: Okay to shower. Wash right groin area with mild soap and water, pat dry. Do not soak in bathtub   07/04/22 0737           Verbal and written Discharge instructions given to the patient. Wound care per Discharge AVS  Follow-up Information     [facility] Follow up.   Why: 4-6 weeks. The office will call the patient with an appointment Contact information: [place] [phone]                Signed: Karoline Caldwell 07/04/2022, 3:24 PM

## 2022-07-04 NOTE — Discharge Instructions (Signed)
[facility]  Discharge Instructions  Lower Extremity Angiogram; Angioplasty/Stenting  Please refer to the following instructions for your post-procedure care. Your surgeon or physician assistant will discuss any changes with you.  Activity  Avoid lifting more than 8 pounds (1 gallons of milk) for 5 days after your procedure. You may walk as much as you can tolerate. It's OK to drive after 72 hours.  Bathing/Showering  You may shower the day after your procedure. If you have a bandage, you may remove it at 24- 48 hours. Clean your incision site with mild soap and water. Pat the area dry with a clean towel.  Diet  Resume your pre-procedure diet. There are no special food restrictions following this procedure. All patients with peripheral vascular disease should follow a low fat/low cholesterol diet. In order to heal from your surgery, it is CRITICAL to get adequate nutrition. Your body requires vitamins, minerals, and protein. Vegetables are the best source of vitamins and minerals. Vegetables also provide the perfect balance of protein. Processed food has little nutritional value, so try to avoid this.  Medications  Resume taking all of your medications unless your doctor tells you not to. If your incision is causing pain, you may take over-the-counter pain relievers such as acetaminophen (Tylenol)  Follow Up  Follow up will be arranged at the time of your procedure. You may have an office visit scheduled or may be scheduled for surgery. Ask your surgeon if you have any questions.  Please call us immediately for any of the following conditions: Severe or worsening pain your legs or feet at rest or with walking. Increased pain, redness, drainage at your groin puncture site. Fever of 101 degrees or higher. If you have any mild or slow bleeding from your puncture site: lie down, apply firm constant pressure over the area with a piece of gauze or a clean  wash cloth for 30 minutes- no peeking!, call 911 right away if you are still bleeding after 30 minutes, or if the bleeding is heavy and unmanageable.  Reduce your risk factors of vascular disease:  Stop smoking. If you would like help call [facility] at 1-800-QUIT-NOW 7857659519) or [place] at (205)[phone]. Manage your cholesterol Maintain a desired weight Control your diabetes Keep your blood pressure down  If you have any questions, please call the office at [phone] [facility]  Discharge Instructions  Lower Extremity Angiogram; Angioplasty/Stenting  Please refer to the following instructions for your post-procedure care. Your surgeon or physician assistant will discuss any changes with you.  Activity  Avoid lifting more than 8 pounds (1 gallons of milk) for 5 days after your procedure. You may walk as much as you can tolerate. It's OK to drive after 72 hours.  Bathing/Showering  You may shower the day after your procedure. If you have a bandage, you may remove it at 24- 48 hours. Clean your incision site with mild soap and water. Pat the area dry with a clean towel.  Diet  Resume your pre-procedure diet. There are no special food restrictions following this procedure. All patients with peripheral vascular disease should follow a low fat/low cholesterol diet. In order to heal from your surgery, it is CRITICAL to get adequate nutrition. Your body requires vitamins, minerals, and protein. Vegetables are the best source of vitamins and minerals. Vegetables also provide the perfect balance of protein. Processed food has little nutritional value, so try to avoid this.  Medications  Resume taking all of your medications unless your doctor tells you not to. If your incision is causing pain, you may take over-the-counter pain relievers such as acetaminophen (Tylenol)  Follow Up  Follow up will be arranged at the time of your procedure. You  may have an office visit scheduled or may be scheduled for surgery. Ask your surgeon if you have any questions.  Please call us immediately for any of the following conditions: Severe or worsening pain your legs or feet at rest or with walking. Increased pain, redness, drainage at your groin puncture site. Fever of 101 degrees or higher. If you have any mild or slow bleeding from your puncture site: lie down, apply firm constant pressure over the area with a piece of gauze or a clean wash cloth for 30 minutes- no peeking!, call 911 right away if you are still bleeding after 30 minutes, or if the bleeding is heavy and unmanageable.  Reduce your risk factors of vascular disease:  Stop smoking. If you would like help call [facility] at 1-800-QUIT-NOW 205-357-5453) or [place] at [phone]. Manage your cholesterol Maintain a desired weight Control your diabetes Keep your blood pressure down  If you have any questions, please call the office at [phone]

## 2022-07-04 NOTE — Progress Notes (Addendum)
     Progress Note    07/04/2022 7:10 AM 1 Day Post-Op  Subjective:  no complaints. Eager to go home. Ambulating without difficulty. Voiding without issues   Vitals:   07/03/22 2019 07/04/22 0510  BP: 108/74 138/77  Pulse: 79 72  Resp:    Temp: 98.3 F (36.8 C) 98.3 F (36.8 C)  SpO2: 95% 94%   Physical Exam: Cardiac:  regular Lungs:  non labored Extremities:  Right common femoral access site c/d/I. BLE extremities well perfused and warm with palpable Dp pulses  Neurologic: alert and oriented  CBC    Component Value Date/Time   WBC 7.0 07/04/2022 0223   RBC 4.41 07/04/2022 0223   HGB 14.7 07/04/2022 0223   HGB 13.8 04/30/2014 1118   HCT 42.2 07/04/2022 0223   HCT 42.0 04/30/2014 1118   PLT 163 07/04/2022 0223   PLT 215 04/30/2014 1118   MCV 95.7 07/04/2022 0223   MCV 98.1 (H) 04/30/2014 1118   MCH 33.3 07/04/2022 0223   MCHC 34.8 07/04/2022 0223   RDW 11.9 07/04/2022 0223   RDW 13.1 04/30/2014 1118   LYMPHSABS 1.0 12/07/2021 1151   LYMPHSABS 1.3 04/30/2014 1118   MONOABS 0.5 12/07/2021 1151   MONOABS 0.7 04/30/2014 1118   EOSABS 0.1 12/07/2021 1151   EOSABS 0.1 04/30/2014 1118   BASOSABS 0.0 12/07/2021 1151   BASOSABS 0.1 04/30/2014 1118    BMET    Component Value Date/Time   NA 137 07/04/2022 0223   NA 140 07/05/2017 1024   K 3.6 07/04/2022 0223   CL 105 07/04/2022 0223   CO2 20 (L) 07/04/2022 0223   GLUCOSE 154 (H) 07/04/2022 0223   BUN 26 (H) 07/04/2022 0223   BUN 19 07/05/2017 1024   CREATININE 1.37 (H) 07/04/2022 0223   CREATININE 1.17 04/23/2020 0914   CALCIUM 8.3 (L) 07/04/2022 0223   GFRNONAA 55 (L) 07/04/2022 0223   GFRAA 67 07/05/2017 1024    INR    Component Value Date/Time   INR 0.86 01/25/2011 1421     Intake/Output Summary (Last 24 hours) at 07/04/2022 0710 Last data filed at 07/03/2022 1714 Gross per 24 hour  Intake 1220 ml  Output 350 ml  Net 870 ml     Assessment/Plan:  72 y.o. male is s/p Aortogram, Arteriogram of  LLE with left SFA stent 1 Day Post-Op   Right common femoral access site without swelling or hematoma LLE well perfused and warm with palpable DP pulse Will go home on Aspirin, Statin and Plavix Stable for discharge home today. He is okay to drive home this morning Will have follow up in 4-6 weeks with BLE arterial duplex   Karoline Caldwell, PA-C [facility] [phone] 07/04/2022 7:10 AM  I have independently interviewed and examined patient and agree with PA assessment and plan above.  Briskly palpable posterior tibial pulse on the left right groin is soft.  Plan for aspirin, Plavix and statin.  Follow-up with me in 4 to 6 weeks with bilateral lower extremity arterial duplex and ABIs will possibly require intervention right lower extremity as well.  Right lower extremity was not evaluated yesterday given elevated creatinine at presentation.  Lashauna Arpin C. Donzetta Matters, MD [facility] [place] Office: [phone] Pager: [phone]

## 2022-07-05 ENCOUNTER — Telehealth: Payer: Self-pay | Admitting: Vascular Surgery

## 2022-07-05 NOTE — Telephone Encounter (Signed)
-----   Message from Waynetta Sandy, MD sent at 07/03/2022  9:47 AM EST ----- [name] [id number] 1951/02/02  07/03/2022 Pre-operative Diagnosis: Left lower extremity life-limiting claudication  Surgeon:  Erlene Quan C. Donzetta Matters, MD  Procedure Performed: 1.  Ultrasound-guided cannulation right common femoral artery 2.  Aortogram with left lower extremity angiogram 3.  Stent of left SFA with distal 6 x 150 mm Eluvia and proximal 6 x 100 mm Eluvia 4.  Moderate sedation with fentanyl and Versed for 45 minutes 5.  Mynx device closure right common femoral artery  Follow-up with me in 4 to 6 weeks with bilateral lower extremity arterial duplexes and ABIs.  Erlene Quan

## 2022-07-07 ENCOUNTER — Telehealth: Payer: Self-pay

## 2022-07-07 DIAGNOSIS — I70212 Atherosclerosis of native arteries of extremities with intermittent claudication, left leg: Secondary | ICD-10-CM

## 2022-07-07 DIAGNOSIS — I739 Peripheral vascular disease, unspecified: Secondary | ICD-10-CM

## 2022-07-07 NOTE — Telephone Encounter (Signed)
Pt called stating that he had a procedure with stents with Dr. Donzetta Matters on Monday, 1/8, d/c'd home on Tuesday. He was told to monitor for discoloration and there are 2 areas that have gotten larger.  Reviewed pt's chart, returned call for clarification, two identifiers used. Pt denies any firm areas. Pt states that the bruising at his groin and side is diffuse and splotchy. Pt given S&S to monitor. Pt states he is feeling well, no pain, no issues. Post-op appts scheduled d/t difficulty with previous attempt. Pt states his phone won't allow for a vm to be setup. Confirmed understanding.

## 2022-07-07 NOTE — Telephone Encounter (Signed)
Appointment scheduled.

## 2022-07-13 ENCOUNTER — Other Ambulatory Visit: Payer: Self-pay | Admitting: Family Medicine

## 2022-07-13 DIAGNOSIS — I10 Essential (primary) hypertension: Secondary | ICD-10-CM

## 2022-07-31 ENCOUNTER — Other Ambulatory Visit (HOSPITAL_BASED_OUTPATIENT_CLINIC_OR_DEPARTMENT_OTHER): Payer: Self-pay

## 2022-07-31 DIAGNOSIS — E1143 Type 2 diabetes mellitus with diabetic autonomic (poly)neuropathy: Secondary | ICD-10-CM

## 2022-07-31 MED ORDER — RYBELSUS 14 MG PO TABS: 14.0000 mg | ORAL_TABLET | Freq: Every day | ORAL | 3 refills | Status: DC

## 2022-08-02 ENCOUNTER — Other Ambulatory Visit: Payer: Self-pay | Admitting: Family Medicine

## 2022-08-02 NOTE — Telephone Encounter (Signed)
Pt called and requested a refill on his Lunesta 2 mg. He would like for it to be sent to the pharmacy on file.  Thanks

## 2022-08-03 ENCOUNTER — Other Ambulatory Visit: Payer: Self-pay | Admitting: Family Medicine

## 2022-08-15 ENCOUNTER — Telehealth (HOSPITAL_BASED_OUTPATIENT_CLINIC_OR_DEPARTMENT_OTHER): Payer: Self-pay | Admitting: Family Medicine

## 2022-08-15 NOTE — Telephone Encounter (Signed)
Called patient to schedule Medicare Annual Wellness Visit (AWV). No voicemail available to leave a message.  Last date of AWV: 06/08/2021   Please schedule an appointment at any time with [place], [facility].  If any questions, please contact me at [phone].    Thank you,  [place] Direct dial  [phone]

## 2022-08-16 ENCOUNTER — Ambulatory Visit (INDEPENDENT_AMBULATORY_CARE_PROVIDER_SITE_OTHER): Payer: PPO | Admitting: Physician Assistant

## 2022-08-16 ENCOUNTER — Ambulatory Visit (HOSPITAL_COMMUNITY)
Admission: RE | Admit: 2022-08-16 | Discharge: 2022-08-16 | Disposition: A | Discharge status: Home / Self Care | Payer: PPO | Source: Ambulatory Visit | Attending: Vascular Surgery | Admitting: Vascular Surgery

## 2022-08-16 ENCOUNTER — Telehealth (HOSPITAL_BASED_OUTPATIENT_CLINIC_OR_DEPARTMENT_OTHER): Payer: Self-pay | Admitting: Family Medicine

## 2022-08-16 ENCOUNTER — Ambulatory Visit (INDEPENDENT_AMBULATORY_CARE_PROVIDER_SITE_OTHER)
Admission: RE | Admit: 2022-08-16 | Discharge: 2022-08-16 | Disposition: A | Discharge status: Home / Self Care | Payer: PPO | Source: Ambulatory Visit | Attending: Vascular Surgery | Admitting: Vascular Surgery

## 2022-08-16 ENCOUNTER — Other Ambulatory Visit (HOSPITAL_BASED_OUTPATIENT_CLINIC_OR_DEPARTMENT_OTHER): Payer: Self-pay

## 2022-08-16 VITALS — BP 149/100 | HR 59 | Temp 97.9°F | Ht 71.0 in | Wt 202.0 lb

## 2022-08-16 DIAGNOSIS — I739 Peripheral vascular disease, unspecified: Secondary | ICD-10-CM | POA: Insufficient documentation

## 2022-08-16 DIAGNOSIS — I70212 Atherosclerosis of native arteries of extremities with intermittent claudication, left leg: Secondary | ICD-10-CM | POA: Insufficient documentation

## 2022-08-16 LAB — VAS US ABI WITH/WO TBI
Left ABI: 1.2
Right ABI: 1.15

## 2022-08-16 MED ORDER — COVID-19 MRNA VAC-TRIS(PFIZER) 30 MCG/0.3ML IM SUSY
0.3000 mL | PREFILLED_SYRINGE | Freq: Once | INTRAMUSCULAR | 0 refills | Status: AC
  Filled 2022-08-16: qty 0.3, 1d supply, fill #0

## 2022-08-16 NOTE — Progress Notes (Signed)
Office Note   History of Present Illness   Jeff Lopez is a 72 y.o. (1950/06/28) male who presents for surveillance of PAD.  He recently underwent left SFA stenting on 07/03/2022 by Dr. Donzetta Matters.  This was done for left lower extremity lifestyle limiting claudication without rest pain or tissue loss.   Postoperatively, the patient has been doing well.  He denies any issues with his cath site in the right groin.  He states that his claudication in the left calf has completely gone away since his procedure.  He is still taking his aspirin, Plavix, and statin.  He would like to stop taking his Plavix as soon as he can to reduce the number of medications he needs.  He denies any claudication, rest pain, tissue loss in the right lower extremity.  He is still having what sounds like restless legs at night.   Current Outpatient Medications  Medication Sig Dispense Refill   amLODipine-olmesartan (AZOR) 5-40 MG tablet TAKE 1 TABLET BY MOUTH EVERY DAY 90 tablet 0   aspirin EC 81 MG tablet Take 81 mg by mouth daily. Swallow whole.     cholecalciferol (VITAMIN D3) 25 MCG (1000 UT) tablet Take 1,000 Units by mouth daily.     clopidogrel (PLAVIX) 75 MG tablet Take 1 tablet (75 mg total) by mouth daily. 30 tablet 3   COVID-19 mRNA vaccine 2023-2024 (COMIRNATY) syringe Inject 0.3 mLs into the muscle once for 1 dose. 0.3 mL 0   eszopiclone (LUNESTA) 2 MG TABS tablet TAKE 1 TABLET BY MOUTH NIGHTLY AT BEDTIME AS NEEDED FOR SLEEP. take immediately BEFORE bedtime 30 tablet 1   levothyroxine (SYNTHROID) 137 MCG tablet TAKE 1 TABLET BY MOUTH EVERY DAY BEFORE BREAKFAST 90 tablet 1   metoprolol succinate (TOPROL-XL) 50 MG 24 hr tablet TAKE 1 TABLET BY MOUTH EVERY DAY WITH OR IMMEDIATELY FOLLOWING A MEAL (Patient taking differently: Take 50 mg by mouth daily.) 90 tablet 3   pantoprazole (PROTONIX) 40 MG tablet Take 1 tablet (40 mg total) by mouth 2 (two) times daily before a meal. 180 tablet 3   rosuvastatin  (CRESTOR) 10 MG tablet Take 1 tablet (10 mg total) by mouth daily. 30 tablet 11   Semaglutide (RYBELSUS) 14 MG TABS Take 1 tablet (14 mg total) by mouth daily. 30 tablet 3   No current facility-administered medications for this visit.    REVIEW OF SYSTEMS (negative unless checked):   Cardiac:  []$  Chest pain or chest pressure? []$  Shortness of breath upon activity? []$  Shortness of breath when lying flat? []$  Irregular heart rhythm?  Vascular:  []$  Pain in calf, thigh, or hip brought on by walking? []$  Pain in feet at night that wakes you up from your sleep? []$  Blood clot in your veins? []$  Leg swelling?  Pulmonary:  []$  Oxygen at home? []$  Productive cough? []$  Wheezing?  Neurologic:  []$  Sudden weakness in arms or legs? []$  Sudden numbness in arms or legs? []$  Sudden onset of difficult speaking or slurred speech? []$  Temporary loss of vision in one eye? []$  Problems with dizziness?  Gastrointestinal:  []$  Blood in stool? []$  Vomited blood?  Genitourinary:  []$  Burning when urinating? []$  Blood in urine?  Psychiatric:  []$  Major depression  Hematologic:  []$  Bleeding problems? []$  Problems with blood clotting?  Dermatologic:  []$  Rashes or ulcers?  Constitutional:  []$  Fever or chills?  Ear/Nose/Throat:  []$  Change in hearing? []$  Nose bleeds? []$  Sore throat?  Musculoskeletal:  []$   Back pain? []$  Joint pain? []$  Muscle pain?   Physical Examination   Vitals:   08/16/22 1014  BP: (!) 149/100  Pulse: (!) 59  Temp: 97.9 F (36.6 C)  TempSrc: Temporal  SpO2: 95%  Weight: 202 lb (91.6 kg)  Height: 5' 11"$  (1.803 m)   Body mass index is 28.17 kg/m.  General:  WDWN in NAD; vital signs documented above Gait: Not observed HENT: WNL, normocephalic Pulmonary: normal non-labored breathing  Cardiac: RRR Abdomen: soft, NT, no masses Skin: without rashes Vascular Exam/Pulses: palpable DP pulses  Extremities: without ischemic changes, without Gangrene , without cellulitis;  without open wounds;  Musculoskeletal: no muscle wasting or atrophy  Neurologic: A&O X 3;  No focal weakness or paresthesias are detected Psychiatric:  The pt has Normal affect.  Non-Invasive Vascular imaging   ABI (08/16/2022) R:  ABI: 1.15 (1.0),  PT: tri DP: tri TBI:  0.81 L:  ABI: 1.2 (0.82),  PT: tri DP: tri TBI: 0.93  BLE Arterial Duplex (08/16/2022)  RIGHT      PSV cm/sRatioStenosis       Waveform Comments  +-----------+--------+-----+---------------+---------+--------+  EIA Distal 86                          biphasic           +-----------+--------+-----+---------------+---------+--------+  CFA Prox   94                          biphasic           +-----------+--------+-----+---------------+---------+--------+  DFA       93                          biphasic           +-----------+--------+-----+---------------+---------+--------+  SFA Prox   89                          triphasic          +-----------+--------+-----+---------------+---------+--------+  SFA Mid    306          50-74% stenosistriphasic          +-----------+--------+-----+---------------+---------+--------+  SFA Distal 57                          triphasic          +-----------+--------+-----+---------------+---------+--------+  POP Prox   73                          biphasic           +-----------+--------+-----+---------------+---------+--------+  POP Mid    59                          triphasic          +-----------+--------+-----+---------------+---------+--------+  POP Distal 51                          biphasic           +-----------+--------+-----+---------------+---------+--------+  TP Trunk   67                          triphasic          +-----------+--------+-----+---------------+---------+--------+  ATA Distal 91                          triphasic           +-----------+--------+-----+---------------+---------+--------+  PTA Distal 20                          biphasic           +-----------+--------+-----+---------------+---------+--------+  PERO Distal34                          biphasic           +-----------+--------+-----+---------------+---------+--------+        +----------+--------+-----+---------------+---------+-----------------+  LEFT     PSV cm/sRatioStenosis       Waveform Comments           +----------+--------+-----+---------------+---------+-----------------+  EIA [id number]                         triphasic                   +----------+--------+-----+---------------+---------+-----------------+  CFA Prox  156                         triphasic                   +----------+--------+-----+---------------+---------+-----------------+  DFA      51                          triphasic                   +----------+--------+-----+---------------+---------+-----------------+  SFA Prox  234          50-74% stenosis         visualized plaque  +----------+--------+-----+---------------+---------+-----------------+  POP Mid   129                         triphasicplaque             +----------+--------+-----+---------------+---------+-----------------+  POP Distal115                         triphasic                   +----------+--------+-----+---------------+---------+-----------------+  TP Trunk  82                          triphasicplaque             +----------+--------+-----+---------------+---------+-----------------+  ATA Distal73                          triphasic                   +----------+--------+-----+---------------+---------+-----------------+  PTA Distal74                          triphasic                   +----------+--------+-----+---------------+---------+-----------------+  PERO Prox  NV                 +----------+--------+-----+---------------+---------+-----------------+     Left Stent(s):  +---------------+--------+--------+---------+--------+  SFA           PSV cm/sStenosisWaveform Comments  +---------------+--------+--------+---------+--------+  Prox to Stent  90              triphasic          +---------------+--------+--------+---------+--------+  Proximal Stent 93              triphasic          +---------------+--------+--------+---------+--------+  Mid Stent      95              triphasic          +---------------+--------+--------+---------+--------+  Distal Stent   69              triphasic          +---------------+--------+--------+---------+--------+  Distal to Stent72              biphasic           +---------------+--------+--------+---------+--------+    Medical Decision Making   Jeff Lopez is a 72 y.o. male who presents for surveillance of PAD  Based on the patient's vascular studies, his left lower extremity blood flow has greatly improved since SFA stenting.  His left ABI has improved from 0.82 to 1.2.  There is triphasic flow in the left PT/DP.  Arterial duplex studies of bilateral lower extremities demonstrates 50 to 74% stenosis in the right mid SFA with increased velocities of 306.  There is also 50 to 74% stenosis in the proximal left SFA with increased velocities of 234.  The left SFA stent is patent without signs of stenosis. [name] left lower extremity lifestyle claudication is now resolved after SFA stenting.  He denies any symptoms of claudication, rest pain, or tissue loss in either of his legs.  Due to this, we will continue to monitor his symptoms.  He may need right lower extremity intervention on his SFA in the future if he should develop symptoms He can continue his Plavix until he has taken it for 3 months, and then he can stop it.  He will continue to take his aspirin and statin  indefinitely. He can follow-up with our office in 6 months with bilateral lower extremity arterial duplex studies and ABIs   Vicente Serene PA-C [facility] Office: [facility] MD: Donzetta Matters

## 2022-08-16 NOTE — Telephone Encounter (Signed)
No to the AWV

## 2022-08-18 ENCOUNTER — Other Ambulatory Visit: Payer: Self-pay

## 2022-08-18 ENCOUNTER — Encounter (HOSPITAL_BASED_OUTPATIENT_CLINIC_OR_DEPARTMENT_OTHER): Payer: Self-pay

## 2022-08-18 DIAGNOSIS — I70212 Atherosclerosis of native arteries of extremities with intermittent claudication, left leg: Secondary | ICD-10-CM

## 2022-08-18 DIAGNOSIS — I739 Peripheral vascular disease, unspecified: Secondary | ICD-10-CM

## 2022-08-22 ENCOUNTER — Encounter (HOSPITAL_BASED_OUTPATIENT_CLINIC_OR_DEPARTMENT_OTHER): Payer: Self-pay | Admitting: Family Medicine

## 2022-08-22 ENCOUNTER — Ambulatory Visit (INDEPENDENT_AMBULATORY_CARE_PROVIDER_SITE_OTHER): Payer: PPO | Admitting: Family Medicine

## 2022-08-22 VITALS — BP 124/70 | HR 67 | Ht 71.0 in | Wt 195.0 lb

## 2022-08-22 DIAGNOSIS — R42 Dizziness and giddiness: Secondary | ICD-10-CM

## 2022-08-22 DIAGNOSIS — R0789 Other chest pain: Secondary | ICD-10-CM | POA: Insufficient documentation

## 2022-08-22 DIAGNOSIS — R29818 Other symptoms and signs involving the nervous system: Secondary | ICD-10-CM

## 2022-08-22 NOTE — Progress Notes (Signed)
Established Patient Office Visit  Subjective   Patient ID: Jeff Lopez, male    DOB: 1951-02-06  Age: 72 y.o. MRN: [id number]  Chief Complaint  Patient presents with   Dizziness    Pt here for feeling dizzy for two weeks now, started two weeks ago, he stated he was laying down and it felt like the room was spinning    Nausea   Headache    HPI Presents today for an acute visit with complaint of feeling like the "bed is spinning when I lay down". This first occurred 2 weeks ago, and then again 3 nights ago. Feels like he is off balance.  Symptoms have been present  2 weeks ago, this has happened about 4 times, did not happen last night.  Associated symptoms include: nausea, vomiting (after initial episode), feeling shaky, chest pressure, headache.  He does not check his blood sugar at home.   Pertinent negatives:  no worsening shortness of breath, no diaphoresis, no vidion changes.   Pain severity: n/a Treatments tried include : drank apple juice last night, and did not have dizziness last night.  Treatment effective : did not have symptoms last night after drinking apple juice.  Almost called 911 during episode but decided not to do so.    Diabetes: Medication compliance: Rybellus 14 mg as prescribed. Has been on 14 mg for 6 months.  Denies hypoglycemia, even though he does not check his blood sugars at home.  Unsure, attributes episodes to blood sugars.  Pertinent lab work: A1C: 12/07/21 7.0  Monitoring: blood sugar readings at home: does not monitor at home. Recommend he start monitoring his blood sugar at home.            Chart reviewed:  09/30/21: exercise stress test with normal results per cardiology.  History reviewed.  Previous EKG reviewed.    Review of Systems  Constitutional:  Positive for fever (felt like he was getting warm during episode). Negative for chills and diaphoresis.  Eyes:  Negative for blurred vision and double vision.  Respiratory:  Positive for  shortness of breath (with exertion, no changes with episodes).   Cardiovascular:  Positive for chest pain (pressure in chest during episodes). Negative for palpitations.  Gastrointestinal:  Positive for nausea and vomiting (x 1 2 weeks ago). Negative for abdominal pain.  Neurological:  Positive for dizziness and headaches. Negative for weakness.      Objective:     BP 124/70   Pulse 67   Ht '5\' 11"'$  (1.803 m)   Wt 195 lb (88.5 kg)   SpO2 99%   BMI 27.20 kg/m  BP Readings from Last 3 Encounters:  08/22/22 124/70  08/16/22 (!) 149/100  07/04/22 (!) 133/113      Physical Exam Vitals and nursing note reviewed.  Constitutional:      General: He is not in acute distress.    Appearance: He is well-developed.  Cardiovascular:     Rate and Rhythm: Normal rate and regular rhythm.     Heart sounds: Normal heart sounds.  Pulmonary:     Effort: Pulmonary effort is normal.     Breath sounds: Normal breath sounds.  Skin:    General: Skin is warm and dry.  Neurological:     General: No focal deficit present.     Mental Status: He is alert and oriented to person, place, and time. Mental status is at baseline.     Sensory: Sensation is intact.  Motor: Motor function is intact.     Coordination: Romberg sign positive. Coordination normal. Finger-Nose-Finger Test and Heel to [facility] Test normal.     Gait: Gait is intact.     Comments: Alert, communicative with normal speech. PERRLA, vision grossly intact. EOM normal, no nystagmus. Smile symmetric. Uvula midline, swallowing intact, tongue midline and able to move side to side. Clinches jaw, puffs cheeks, closes eyes tightly, raises eyebrows. Hearing grossly intact. Moves head side to side against resistance. Shrugs shoulders against resistance.  5/5 upper and lower extremity strength. Ambulates with coordinated gait. Finger to nose normal. Rapid alternating hands normal. Heel to shin normal. Romberg positive.   Dix-Hallpike  negative bilaterally.     Psychiatric:        Mood and Affect: Mood normal.        Behavior: Behavior normal.        Thought Content: Thought content normal.        Judgment: Judgment normal.     No results found for any visits on 08/22/22.    The 10-year ASCVD risk score (Arnett DK, et al., 2019) is: 34.7%    Assessment & Plan:   Problem List Items Addressed This Visit     Chest pressure    Patient has a history of nonobstructive coronary artery disease, hypertension, diabetes, previous smoker, and hyperlipidemia.  He is a patient of Dr. Burt Knack, cardiology, had an exercise stress test done September 30, 2021 with excellent results.  Patient presents today with complaint of dizziness, chest pressure, nausea, vomiting, and headaches.  EKG performed shows normal sinus rhythm, no evidence of ischemia.  Referral specialist was able to get patient seen at Dr. Antionette Char office Wednesday, March 6.  Patient agrees that if the symptoms recur he will call EMS and go to the emergency department immediately for further evaluation.      Relevant Orders   EKG 12-Lead (Completed)   Dizziness - Primary    Patient reports 3 weeks ago when lying down in bed he experienced "the bed spinning ".  During that time he felt nauseated and vomited x 1.  Symptoms did not return until 3 nights ago, and they have happened every night since except for last night.  Patient reports that he does not check his blood sugar, he drank apple juice last night and did not have any symptoms.  Neurological exam performed in the office, patient had a positive Romberg test, otherwise, he was neurologically intact.  Was also negative for [facility] maneuver.  Urgent neurology referral placed today for further evaluation, with the understanding that if symptoms return patient will call EMS and go to the emergency department immediately for further evaluation.      Relevant Orders   EKG 12-Lead (Completed)   Romberg's test positive     Neurological exam performed in office, patient had a positive Romberg's test, otherwise a normal neurological exam.  Referral for neurology placed today.      Relevant Orders   Ambulatory referral to Neurology   EKG: NSR, no evidence of ischemia.  Dix-Hallpike negative in office.   Concerning symptoms: positive romberg, chest pressure, nausea, headaches, dizziness.  Urgent neurology referral placed today, patient given information and will call office to make appointment later today.  Cardiology appointment was able to see patient next Wednesday, August 30, 2022.  Symptoms reviewed that warrant seeking higher level of care at the emergency department, and patient agrees to call EMS of symptoms return.  Agrees with plan  of care discussed.  Questions answered.  Return in about 1 month (around 09/20/2022) for for dizziness .    Chalmers Guest, FNP

## 2022-08-22 NOTE — Assessment & Plan Note (Signed)
Patient has a history of nonobstructive coronary artery disease, hypertension, diabetes, previous smoker, and hyperlipidemia.  He is a patient of Dr. Burt Knack, cardiology, had an exercise stress test done September 30, 2021 with excellent results.  Patient presents today with complaint of dizziness, chest pressure, nausea, vomiting, and headaches.  EKG performed shows normal sinus rhythm, no evidence of ischemia.  Referral specialist was able to get patient seen at Dr. Antionette Char office Wednesday, March 6.  Patient agrees that if the symptoms recur he will call EMS and go to the emergency department immediately for further evaluation.

## 2022-08-22 NOTE — Assessment & Plan Note (Signed)
Patient reports 3 weeks ago when lying down in bed he experienced "the bed spinning ".  During that time he felt nauseated and vomited x 1.  Symptoms did not return until 3 nights ago, and they have happened every night since except for last night.  Patient reports that he does not check his blood sugar, he drank apple juice last night and did not have any symptoms.  Neurological exam performed in the office, patient had a positive Romberg test, otherwise, he was neurologically intact.  Was also negative for [facility] maneuver.  Urgent neurology referral placed today for further evaluation, with the understanding that if symptoms return patient will call EMS and go to the emergency department immediately for further evaluation.

## 2022-08-22 NOTE — Patient Instructions (Addendum)
I want you to see cardiology for further evaluation. Your appointment has been moved to next Wednesday.  I want you to see neurology for further evaluation. Call them when you get home.   You had a positive romberg test in office today. You are having concerning symptoms: chest pressure, nausea, shortness of breath.   You need to check your blood sugar.  IF YOU DEVELOP THESE SYMPTOMS YOU NEED TO CALL 911 AND GO TO THE EMERGENCY DEPARTMENT FOR EVALUATION.

## 2022-08-22 NOTE — Assessment & Plan Note (Signed)
Neurological exam performed in office, patient had a positive Romberg's test, otherwise a normal neurological exam.  Referral for neurology placed today.

## 2022-08-24 ENCOUNTER — Encounter: Payer: Self-pay | Admitting: Diagnostic Neuroimaging

## 2022-08-24 ENCOUNTER — Ambulatory Visit: Payer: PPO | Admitting: Diagnostic Neuroimaging

## 2022-08-24 ENCOUNTER — Telehealth: Payer: Self-pay | Admitting: Diagnostic Neuroimaging

## 2022-08-24 VITALS — BP 107/62 | HR 70 | Ht 69.0 in | Wt 194.8 lb

## 2022-08-24 DIAGNOSIS — G450 Vertebro-basilar artery syndrome: Secondary | ICD-10-CM

## 2022-08-24 DIAGNOSIS — H814 Vertigo of central origin: Secondary | ICD-10-CM | POA: Diagnosis not present

## 2022-08-24 NOTE — Progress Notes (Signed)
[facility]  PATIENT: Jeff Lopez DOB: [birth year]  REFERRING CLINICIAN: Chalmers Guest, FNP HISTORY FROM: patient  REASON FOR VISIT: new consult   HISTORICAL  CHIEF COMPLAINT:  Chief Complaint  Patient presents with   New Patient (Initial Visit)    Patient in room #7 and alone. Pt here today to discuss his positive Romberg's test. Pt states he has been feeling dizzy while laying in bed.    HISTORY OF PRESENT ILLNESS:   72 year old male here for evaluation of intermittent vertigo.  2 weeks ago patient laid down in bed and all of a sudden he felt severe room spinning sensation.  He tried to get out of bed due to severe nausea but then when he got up he vomited.  He was off balance for several minutes and symptoms gradually subsided.  1 week ago patient had recurrence of this 4 days in a row, with positional nausea, dizziness, headache, vertigo when he would lay down in bed.  1 time when he got out of bed he fell down.  Patient went to PCP for evaluation.  Dix-Hallpike maneuver was negative.  Apparently had a positive Romberg sign.  Patient was referred here for further evaluation.  Since that time symptoms have improved.  No symptoms in the last 2 days.  He still feels a little bit of malaise but better than initially.  Denies any transient double vision, slurred speech, syncope or trouble swallowing.  No problems with arms or legs.  Has some chronic neck issues.    REVIEW OF SYSTEMS: Full 14 system review of systems performed and negative with exception of: as per HPI.  ALLERGIES: Allergies  Allergen Reactions   Ceftin Other (See Comments)    Patient stated it caused "sores in mouth" Thrush   Cefuroxime Axetil Other (See Comments)    sores in mouth Patient stated it caused "sores in mouth" Thrush    Cephalexin Other (See Comments)    Other    HOME MEDICATIONS: Outpatient Medications Prior to Visit  Medication Sig Dispense Refill    amLODipine-olmesartan (AZOR) 5-40 MG tablet TAKE 1 TABLET BY MOUTH EVERY DAY 90 tablet 0   aspirin EC 81 MG tablet Take 81 mg by mouth daily. Swallow whole.     cholecalciferol (VITAMIN D3) 25 MCG (1000 UT) tablet Take 1,000 Units by mouth daily.     clopidogrel (PLAVIX) 75 MG tablet Take 1 tablet (75 mg total) by mouth daily. 30 tablet 3   eszopiclone (LUNESTA) 2 MG TABS tablet TAKE 1 TABLET BY MOUTH NIGHTLY AT BEDTIME AS NEEDED FOR SLEEP. take immediately BEFORE bedtime 30 tablet 1   levothyroxine (SYNTHROID) 137 MCG tablet TAKE 1 TABLET BY MOUTH EVERY DAY BEFORE BREAKFAST 90 tablet 1   metoprolol succinate (TOPROL-XL) 50 MG 24 hr tablet TAKE 1 TABLET BY MOUTH EVERY DAY WITH OR IMMEDIATELY FOLLOWING A MEAL (Patient taking differently: Take 50 mg by mouth daily.) 90 tablet 3   pantoprazole (PROTONIX) 40 MG tablet Take 1 tablet (40 mg total) by mouth 2 (two) times daily before a meal. 180 tablet 3   rosuvastatin (CRESTOR) 10 MG tablet Take 1 tablet (10 mg total) by mouth daily. 30 tablet 11   Semaglutide (RYBELSUS) 14 MG TABS Take 1 tablet (14 mg total) by mouth daily. 30 tablet 3   No facility-administered medications prior to visit.    PAST MEDICAL HISTORY: Past Medical History:  Diagnosis Date   Allergy    per pt  CAD, NATIVE VESSEL 04/19/2010   nonobstructive by cath 2007:  oLAD 20-30%, mLAD 50%, pCFX 20-30%, oAVCFX 20-30%, L renal art 50%;  normal LVF   Cataract    bil cataracts removed   COPD (chronic obstructive pulmonary disease) ([place])    Diabetes mellitus without complication ([place])    GERD 04/09/2007   Headache(784.0)    HYPERLIPIDEMIA 04/09/2007   Patient denies.   HYPERTENSION, BENIGN 04/19/2010   Hypothyroidism    Hypothyroidism    previous hyperthyroidism, s/p I-131   LUMBAR DISC DISORDER 05/27/2010   Nerve damage    Left leg   OSTEOARTHRITIS, LUMBAR SPINE 04/09/2007   RENAL CYST 05/27/2010   Spinal headache    After having back surgery in 2014    PAST SURGICAL  HISTORY: Past Surgical History:  Procedure Laterality Date   ABDOMINAL AORTOGRAM W/LOWER EXTREMITY N/A 07/03/2022   Procedure: ABDOMINAL AORTOGRAM W/LOWER EXTREMITY;  Surgeon: Waynetta Sandy, MD;  Location: [facility];  Service: Cardiovascular;  Laterality: N/A;   BACK SURGERY  2012,2014   CARDIAC CATHETERIZATION  2007   Dr Loanne Drilling   CHOLECYSTECTOMY     CHOLECYSTECTOMY N/A 07/13/2013   Procedure: LAPAROSCOPIC CHOLECYSTECTOMY WITH INTRAOPERATIVE CHOLANGIOGRAM;  Surgeon: Shann Medal, MD;  Location: [facility];  Service: General;  Laterality: N/A;   COLONOSCOPY  2018   COLONOSCOPY W/ POLYPECTOMY     ERCP N/A 07/14/2013   Procedure: ENDOSCOPIC RETROGRADE CHOLANGIOPANCREATOGRAPHY (ERCP);  Surgeon: [name], MD;  Location: [facility];  Service: Endoscopy;  Laterality: N/A;   EYE SURGERY Bilateral    Cataract removal   FOOT FRACTURE SURGERY     FRACTURE SURGERY     LUMBAR [place] SURGERY  08/06/2012   L3 & L4   LUMBAR DISC SURGERY  11/11/2015   L1 L2    DR NITKA   LUMBAR FUSION N/A 04/20/2015   Procedure: T12 to L1 fusion (Extension of Previous Fusion L2-S1 to T12-S1), Right Transforaminal lumbar interbody fusion, Posterior Fusion T12 to L1, with Pedicle screws, allograft, local bone graft, and  Vivigen;  Surgeon: [name], MD;  Location: [facility];  Service: Orthopedics;  Laterality: N/A;   LUMBAR LAMINECTOMY N/A 11/10/2013   Procedure: Left L1-2 far lateral approach to excise herniated nucleus pulposus;  Surgeon: [name], MD;  Location: [facility];  Service: Orthopedics;  Laterality: N/A;   LUMBAR LAMINECTOMY/DECOMPRESSION MICRODISCECTOMY Left 08/10/2012   Procedure: Dura Repair Left Side L2-L3;  Surgeon: [name], MD;  Location: [facility];  Service: Orthopedics;  Laterality: Left;  Wilson Frame, Sliding table, dura repair kit, microscope   NECK SURGERY     X 2   PERIPHERAL VASCULAR INTERVENTION Left 07/03/2022   Procedure: PERIPHERAL VASCULAR INTERVENTION;  Surgeon:  Waynetta Sandy, MD;  Location: [facility];  Service: Cardiovascular;  Laterality: Left;  fem-pop   SINOSCOPY     SPINE SURGERY  2006   C-spine surgery x 2   UPPER GASTROINTESTINAL ENDOSCOPY      FAMILY HISTORY: Family History  Problem Relation Age of Onset   Breast cancer Mother    Uterine cancer Mother    Heart disease Mother    Heart attack Mother 80   Prostate cancer Father    Hypertension Brother    Non-Hodgkin's lymphoma Brother    Stomach cancer Paternal Grandmother    Thyroid disease Neg Hx    Colon cancer Neg Hx    Esophageal cancer Neg Hx    Rectal cancer Neg  Hx    Pancreatic cancer Neg Hx    Colon polyps Neg Hx     SOCIAL HISTORY: Social History   Socioeconomic History   Marital status: Single    Spouse name: Not on file   Number of children: 1   Years of education: Not on file   Highest education level: Not on file  Occupational History   Occupation: DISTRIBUTION MGR    Employer: [employer]  Tobacco Use   Smoking status: Former    Packs/day: 1.00    Years: 37.00    Total pack years: 37.00    Types: Cigarettes    Quit date: 04/06/2008    Years since quitting: 14.3   Smokeless tobacco: Never   Tobacco comments:    pt has stopped smoking about 9 months now  Vaping Use   Vaping Use: Never used  Substance and Sexual Activity   Alcohol use: No   Drug use: No   Sexual activity: Not Currently  Other Topics Concern   Not on file  Social History Narrative   Works in Psychologist, educational. Retired.   Lives alone, has one child in [place].    Social Determinants of Health   Financial Resource Strain: Low Risk  (06/08/2021)   Overall Financial Resource Strain (CARDIA)    Difficulty of Paying Living Expenses: Not hard at all  Food Insecurity: No Food Insecurity (07/03/2022)   Hunger Vital Sign    Worried About Running Out of Food in the Last Year: Never true    Ran Out of Food in the Last Year: Never true  Transportation Needs:  No Transportation Needs (07/03/2022)   PRAPARE - Hydrologist (Medical): No    Lack of Transportation (Non-Medical): No  Physical Activity: Insufficiently Active (06/08/2021)   Exercise Vital Sign    Days of Exercise per Week: 3 days    Minutes of Exercise per Session: 40 min  Stress: No Stress Concern Present (06/08/2021)   [place]    Feeling of Stress : Not at all  Social Connections: Socially Isolated (06/08/2021)   Social Connection and Isolation Panel [NHANES]    Frequency of Communication with Friends and Family: More than three times a week    Frequency of Social Gatherings with Friends and Family: More than three times a week    Attends Religious Services: Never    Marine scientist or Organizations: No    Attends Archivist Meetings: Never    Marital Status: Divorced  Human resources officer Violence: Not At Risk (07/03/2022)   Humiliation, Afraid, Rape, and Kick questionnaire    Fear of Current or Ex-Partner: No    Emotionally Abused: No    Physically Abused: No    Sexually Abused: No     PHYSICAL EXAM  GENERAL EXAM/CONSTITUTIONAL: Vitals:  Vitals:   08/24/22 1541  BP: 107/62  Pulse: 70  Weight: 194 lb 12.8 oz (88.4 kg)  Height: '5\' 9"'$  (1.753 m)   Body mass index is 28.77 kg/m. Wt Readings from Last 3 Encounters:  08/24/22 194 lb 12.8 oz (88.4 kg)  08/22/22 195 lb (88.5 kg)  08/16/22 202 lb (91.6 kg)   Patient is in no distress; well developed, nourished and groomed; neck is supple  CARDIOVASCULAR: Examination of carotid arteries is normal; no carotid bruits Regular rate and rhythm, no murmurs Examination of peripheral vascular system by observation and palpation is normal  EYES: Ophthalmoscopic exam of  optic discs and posterior segments is normal; no papilledema or hemorrhages No results found.  MUSCULOSKELETAL: Gait, strength, tone, movements noted  in Neurologic exam below  NEUROLOGIC: MENTAL STATUS:      No data to display         awake, alert, oriented to person, place and time recent and remote memory intact normal attention and concentration language fluent, comprehension intact, naming intact fund of knowledge appropriate  CRANIAL NERVE:  2nd - no papilledema on fundoscopic exam 2nd, 3rd, 4th, 6th - pupils equal and reactive to light, visual fields full to confrontation, extraocular muscles intact, no nystagmus 5th - facial sensation symmetric 7th - facial strength symmetric 8th - hearing intact 9th - palate elevates symmetrically, uvula midline 11th - shoulder shrug symmetric 12th - tongue protrusion midline  MOTOR:  normal bulk and tone, full strength in the BUE, BLE  SENSORY:  normal and symmetric to light touch, temperature, vibration  COORDINATION:  finger-nose-finger, fine finger movements normal DIX HALLPIKE NEGATIVE  REFLEXES:  deep tendon reflexes present and symmetric  GAIT/STATION:  narrow based gait; ROMBERG NEGATIVE     DIAGNOSTIC DATA (LABS, IMAGING, TESTING) - I reviewed patient records, labs, notes, testing and imaging myself where available.  Lab Results  Component Value Date   WBC 7.0 07/04/2022   HGB 14.7 07/04/2022   HCT 42.2 07/04/2022   MCV 95.7 07/04/2022   PLT 163 07/04/2022      Component Value Date/Time   NA 137 07/04/2022 0223   NA 140 07/05/2017 1024   K 3.6 07/04/2022 0223   CL 105 07/04/2022 0223   CO2 20 (L) 07/04/2022 0223   GLUCOSE 154 (H) 07/04/2022 0223   BUN 26 (H) 07/04/2022 0223   BUN 19 07/05/2017 1024   CREATININE 1.37 (H) 07/04/2022 0223   CREATININE 1.17 04/23/2020 0914   CALCIUM 8.3 (L) 07/04/2022 0223   PROT 6.9 12/07/2021 1151   ALBUMIN 4.3 12/07/2021 1151   AST 17 12/07/2021 1151   ALT 24 12/07/2021 1151   ALKPHOS 76 12/07/2021 1151   BILITOT 1.1 12/07/2021 1151   GFRNONAA 55 (L) 07/04/2022 0223   GFRAA 67 07/05/2017 1024   Lab  Results  Component Value Date   CHOL 142 07/04/2022   HDL 34 (L) 07/04/2022   LDLCALC 73 07/04/2022   LDLDIRECT 99.0 01/26/2021   TRIG 173 (H) 07/04/2022   CHOLHDL 4.2 07/04/2022   Lab Results  Component Value Date   HGBA1C 7.0 (H) 12/07/2021   Lab Results  Component Value Date   VITAMINB12 335 04/23/2020   Lab Results  Component Value Date   TSH 3.01 12/07/2021      ASSESSMENT AND PLAN  72 y.o. year old male here with:   Dx:  1. Central nervous system origin vertigo   2. Vertebrobasilar insufficiency     PLAN:  INTERMITTENT POSITIONAL VERTIGO / nausea, vomiting (ddx: BPPV, labyrinthitis, vs central cause of vertigo --> vertebrobasilar insufficiency, brainstem / cerebellar dz, demyelinating dz) -Sudden severe symptoms, short duration, fatigability favor peripheral cause of vertigo; however patient has significant vascular risk factors and some low-grade symptoms that have continued and therefore we need to pursue neuroimaging to rule out central causes of vertigo - check MRI brain, MRA head, neck (TIA evaluation) - continue aspirin, plavix, statin, semaglutide, BP control  Orders Placed This Encounter  Procedures   MR BRAIN/IAC W WO CONTRAST   MR ANGIO HEAD WO CONTRAST   MR ANGIO NECK W WO CONTRAST   Hemoglobin  A1c   Vitamin B12   Return for pending if symptoms worsen or fail to improve, pending test results.  I reviewed images, labs, notes, records myself. I summarized findings and reviewed with patient, for this high risk condition (TIA evaluation) requiring high complexity decision making.    Penni Bombard, MD [id number], [id number] PM Certified in Neurology, Neurophysiology and Neuroimaging  [facility] [address] [place], [place] [address] [phone]

## 2022-08-24 NOTE — Telephone Encounter (Signed)
[employer]alt[employer] advantage NPR sent to GI [phone]

## 2022-08-24 NOTE — Patient Instructions (Signed)
-   check MRI brain, MRA head, neck (TIA evaluation) - continue aspirin, plavix, statin, semaglutide, BP control

## 2022-08-25 LAB — VITAMIN B12: Vitamin B-12: 367 pg/mL (ref 232–1245)

## 2022-08-25 LAB — HEMOGLOBIN A1C
Est. average glucose Bld gHb Est-mCnc: 163 mg/dL
Hgb A1c MFr Bld: 7.3 % — ABNORMAL HIGH (ref 4.8–5.6)

## 2022-08-28 NOTE — Progress Notes (Unsigned)
Office Visit    Patient Name: Jeff Lopez Date of Encounter: 08/30/2022  PCP:  [place], Raymond J, [place]  Cardiologist:  Sherren Mocha, MD  Advanced Practice Provider:  No care team member to display Electrophysiologist:  None   HPI    Jeff Lopez is a 72 y.o. male with a past medical history of nonobstructive CAD, hypertension, COPD, diabetes, hypertension, prior smoking (quit 2009), and hyperlipidemia presents today for follow-up appointment.   Patient was last seen by Dr. Burt Knack 09/21/2021 and at that time he had had more problems with arthritis over the past year.  Not as active as he once was.  He noted his weight had increased.  Also was having complaints of exertional dyspnea but felt this may be related to his weight gain and increased abdominal girth.  No orthopnea or PND.  No chest pain or pressure.  Did endorse some fatigue.  No palpitations, lightheadedness, or syncope.  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  He is on 4 back surgeries and recently just had his left leg revascularized with stent with vascular surgery.  He is trying to get his activity level back up to where it was.  He used to smoke which also may be contributing to some shortness of breath.  2 weeks ago for 3 nights in a row he experienced spinning of the room with laying down and nausea.  He did have 1 bout of emesis.  He states his balance was off.  He saw his primary who then consulted neurology.  He is scheduled for an MRI of the brain and neck.  He was having some chest pain during that time.  This does not sound particularly cardiac in nature however, we discussed updating an echocardiogram since he does have a history of family coronary disease. He is having daily diarrhea with Semaglutide.   Reports no shortness of breath nor dyspnea on  exertion. Reports no chest pain, pressure, or tightness. No edema, orthopnea, PND. Reports no palpitations.    Past Medical History    Past Medical History:  Diagnosis Date   Allergy    Jeff pt   CAD, NATIVE VESSEL 04/19/2010   nonobstructive by cath 2007:  oLAD 20-30%, mLAD 50%, pCFX 20-30%, oAVCFX 20-30%, L renal art 50%;  normal LVF   Cataract    bil cataracts removed   COPD (chronic obstructive pulmonary disease) ([place])    Diabetes mellitus without complication ([place])    GERD 04/09/2007   Headache(784.0)    HYPERLIPIDEMIA 04/09/2007   Patient denies.   HYPERTENSION, BENIGN 04/19/2010   Hypothyroidism    Hypothyroidism    previous hyperthyroidism, s/p I-131   LUMBAR DISC DISORDER 05/27/2010   Nerve damage    Left leg   OSTEOARTHRITIS, LUMBAR SPINE 04/09/2007   RENAL CYST 05/27/2010   Spinal headache    After having back surgery in 2014   Past Surgical History:  Procedure Laterality Date   ABDOMINAL AORTOGRAM W/LOWER EXTREMITY N/A 07/03/2022   Procedure: ABDOMINAL AORTOGRAM W/LOWER EXTREMITY;  Surgeon: Waynetta Sandy, MD;  Location: [facility];  Service: Cardiovascular;  Laterality: N/A;   BACK SURGERY  2012,2014   CARDIAC CATHETERIZATION  2007   Dr Loanne Drilling   CHOLECYSTECTOMY     CHOLECYSTECTOMY N/A 07/13/2013   Procedure: LAPAROSCOPIC  CHOLECYSTECTOMY WITH INTRAOPERATIVE CHOLANGIOGRAM;  Surgeon: Shann Medal, MD;  Location: [facility];  Service: General;  Laterality: N/A;   COLONOSCOPY  2018   COLONOSCOPY W/ POLYPECTOMY     ERCP N/A 07/14/2013   Procedure: ENDOSCOPIC RETROGRADE CHOLANGIOPANCREATOGRAPHY (ERCP);  Surgeon: [name], MD;  Location: [facility];  Service: Endoscopy;  Laterality: N/A;   EYE SURGERY Bilateral    Cataract removal   FOOT FRACTURE SURGERY     FRACTURE SURGERY     LUMBAR [place] SURGERY  08/06/2012   L3 & L4   LUMBAR DISC SURGERY  11/11/2015   L1 L2    DR NITKA   LUMBAR FUSION N/A 04/20/2015   Procedure: T12 to L1 fusion  (Extension of Previous Fusion L2-S1 to T12-S1), Right Transforaminal lumbar interbody fusion, Posterior Fusion T12 to L1, with Pedicle screws, allograft, local bone graft, and  Vivigen;  Surgeon: [name], MD;  Location: [facility];  Service: Orthopedics;  Laterality: N/A;   LUMBAR LAMINECTOMY N/A 11/10/2013   Procedure: Left L1-2 far lateral approach to excise herniated nucleus pulposus;  Surgeon: [name], MD;  Location: [facility];  Service: Orthopedics;  Laterality: N/A;   LUMBAR LAMINECTOMY/DECOMPRESSION MICRODISCECTOMY Left 08/10/2012   Procedure: Dura Repair Left Side L2-L3;  Surgeon: [name], MD;  Location: [facility];  Service: Orthopedics;  Laterality: Left;  Wilson Frame, Sliding table, dura repair kit, microscope   NECK SURGERY     X 2   PERIPHERAL VASCULAR INTERVENTION Left 07/03/2022   Procedure: PERIPHERAL VASCULAR INTERVENTION;  Surgeon: Waynetta Sandy, MD;  Location: [facility];  Service: Cardiovascular;  Laterality: Left;  fem-pop   SINOSCOPY     SPINE SURGERY  2006   C-spine surgery x 2   UPPER GASTROINTESTINAL ENDOSCOPY      Allergies  Allergies  Allergen Reactions   Ceftin Other (See Comments)    Patient stated it caused "sores in mouth" Thrush   Cefuroxime Axetil Other (See Comments)    sores in mouth Patient stated it caused "sores in mouth" Thrush    Cephalexin Other (See Comments)    Other    EKGs/Labs/Other Studies Reviewed:   The following studies were reviewed today:  Echo 09/20/2015: Study Conclusions   - Left ventricle: The cavity size was normal. Wall thickness was    normal. Systolic function was normal. The estimated ejection    fraction was in the range of 55% to 60%. Wall motion was normal;    there were no regional wall motion abnormalities. Doppler    parameters are consistent with abnormal left ventricular    relaxation (grade 1 diastolic dysfunction).  - Aortic valve: There was mild regurgitation.  - Mitral valve:  Calcified annulus.  - Left atrium: The atrium was mildly dilated.   Impressions:   - Normal LV systolic function; grade 1 diastolic dysfunction; mild    AI; mild LAE.    Myoview Stress Test 04-11-2018: The left ventricular ejection fraction is mildly decreased (45-54%). Nuclear stress EF: 52%. No T wave inversion was noted during stress. There was no ST segment deviation noted during stress. This is a low risk study.   Normal perfusion. LVEF 52% with normal wall motion. This is a low risk study.  EKG:  EKG is not ordered today.    Recent Labs: 12/07/2021: ALT 24; TSH 3.01 07/04/2022: BUN 26; Creatinine, Ser 1.37; Hemoglobin 14.7; Platelets 163; Potassium 3.6; Sodium 137  Recent Lipid Panel    Component  Value Date/Time   CHOL 142 07/04/2022 0223   TRIG 173 (H) 07/04/2022 0223   HDL 34 (L) 07/04/2022 0223   CHOLHDL 4.2 07/04/2022 0223   VLDL 35 07/04/2022 0223   LDLCALC 73 07/04/2022 0223   LDLCALC 75 01/19/2020 0953   LDLDIRECT 99.0 01/26/2021 0848    Home Medications   Current Meds  Medication Sig   amLODipine-olmesartan (AZOR) 5-40 MG tablet TAKE 1 TABLET BY MOUTH EVERY DAY   aspirin EC 81 MG tablet Take 81 mg by mouth daily. Swallow whole.   cholecalciferol (VITAMIN D3) 25 MCG (1000 UT) tablet Take 1,000 Units by mouth daily.   clopidogrel (PLAVIX) 75 MG tablet Take 1 tablet (75 mg total) by mouth daily.   eszopiclone (LUNESTA) 2 MG TABS tablet TAKE 1 TABLET BY MOUTH NIGHTLY AT BEDTIME AS NEEDED FOR SLEEP. take immediately BEFORE bedtime   levothyroxine (SYNTHROID) 137 MCG tablet TAKE 1 TABLET BY MOUTH EVERY DAY BEFORE BREAKFAST   metoprolol succinate (TOPROL-XL) 50 MG 24 hr tablet TAKE 1 TABLET BY MOUTH EVERY DAY WITH OR IMMEDIATELY FOLLOWING A MEAL (Patient taking differently: Take 50 mg by mouth daily.)   pantoprazole (PROTONIX) 40 MG tablet Take 1 tablet (40 mg total) by mouth 2 (two) times daily before a meal.   rosuvastatin (CRESTOR) 10 MG tablet Take 1 tablet (10  mg total) by mouth daily.   Semaglutide (RYBELSUS) 14 MG TABS Take 1 tablet (14 mg total) by mouth daily.     Review of Systems      All other systems reviewed and are otherwise negative except as noted above.  Physical Exam    VS:  BP 132/80   Pulse 69   Ht '5\' 9"'$  (1.753 m)   Wt 197 lb 9.6 oz (89.6 kg)   SpO2 97%   BMI 29.18 kg/m  , BMI Body mass index is 29.18 kg/m.  Wt Readings from Last 3 Encounters:  08/30/22 197 lb 9.6 oz (89.6 kg)  08/24/22 194 lb 12.8 oz (88.4 kg)  08/22/22 195 lb (88.5 kg)     GEN: Well nourished, well developed, in no acute distress. HEENT: normal. Neck: Supple, no JVD, carotid bruits, or masses. Cardiac: RRR, no murmurs, rubs, or gallops. No clubbing, cyanosis, edema.  Radials/PT 2+ and equal bilaterally.  Respiratory:  Respirations regular and unlabored, clear to auscultation bilaterally. GI: Soft, nontender, nondistended. MS: No deformity or atrophy. Skin: Warm and dry, no rash. Neuro:  Strength and sensation are intact. Psych: Normal affect.  Assessment & Plan    BPPV? -He is having positional vertigo -He is undergoing workup with neurology -This happened about 2 weeks ago for 3 days but has not had any further episodes since  Coronary artery disease -No recent chest pain but is having some shortness of breath -Will plan to update an echocardiogram -Continue current medication regimen which includes aspirin 81 mg daily, Plavix 75 mg daily (will stop this in a month), amlodipine/olmesartan 5/40 mg daily, metoprolol succinate 50 mg daily, Crestor 10 mg daily  Hypertension -Well-controlled today 132/80 -Continue current medication regimen -Continue to check blood pressure at home  Type 2 diabetes mellitus -He is on Rybelsus and is causing him a lot of GI distress -A1c 7.3 (08/24/2022) -Discussed that he should talk to his primary care about switching his medication  Hyperlipidemia -Most recent LDL 73, HDL 34, triglycerides  170, -Continue Crestor 10 mg daily -We discussed why this medication is needed       Disposition: Follow  up 6 weeks with Sherren Mocha, MD or APP.  Signed, Elgie Collard, PA-C 08/30/2022, 9:54 AM [place]

## 2022-08-30 ENCOUNTER — Encounter: Payer: Self-pay | Admitting: Physician Assistant

## 2022-08-30 ENCOUNTER — Ambulatory Visit: Payer: PPO | Attending: Physician Assistant | Admitting: Physician Assistant

## 2022-08-30 VITALS — BP 132/80 | HR 69 | Ht 69.0 in | Wt 197.6 lb

## 2022-08-30 DIAGNOSIS — I2510 Atherosclerotic heart disease of native coronary artery without angina pectoris: Secondary | ICD-10-CM

## 2022-08-30 DIAGNOSIS — R079 Chest pain, unspecified: Secondary | ICD-10-CM

## 2022-08-30 DIAGNOSIS — I10 Essential (primary) hypertension: Secondary | ICD-10-CM

## 2022-08-30 DIAGNOSIS — E119 Type 2 diabetes mellitus without complications: Secondary | ICD-10-CM | POA: Diagnosis not present

## 2022-08-30 DIAGNOSIS — E785 Hyperlipidemia, unspecified: Secondary | ICD-10-CM

## 2022-08-30 NOTE — Patient Instructions (Addendum)
Medication Instructions:   Your physician recommends that you continue on your current medications as directed. Please refer to the Current Medication list given to you today.  *If you need a refill on your cardiac medications before your next appointment, please call your pharmacy*   Lab Work: [place]   If you have labs (blood work) drawn today and your tests are completely normal, you will receive your results only by: [place] (if you have MyChart) OR A paper copy in the mail If you have any lab test that is abnormal or we need to change your treatment, we will call you to review the results.   Testing/Procedures: Your physician has requested that you have an echocardiogram. Echocardiography is a painless test that uses sound waves to create images of your heart. It provides your doctor with information about the size and shape of your heart and how well your heart's chambers and valves are working. This procedure takes approximately one hour. There are no restrictions for this procedure. Please do NOT wear cologne, perfume, aftershave, or lotions (deodorant is allowed). Please arrive 15 minutes prior to your appointment time.   Follow-Up: At [facility], you and your health needs are our priority.  As part of our continuing mission to provide you with exceptional heart care, we have created designated Provider Care Teams.  These Care Teams include your primary Cardiologist (physician) and Advanced Practice Providers (APPs -  Physician Assistants and Nurse Practitioners) who all work together to provide you with the care you need, when you need it.  We recommend signing up for the patient portal called "MyChart".  Sign up information is provided on this After Visit Summary.  MyChart is used to connect with patients for Virtual Visits (Telemedicine).  Patients are able to view lab/test results, encounter notes, upcoming appointments, etc.  Non-urgent messages  can be sent to your provider as well.   To learn more about what you can do with MyChart, go to [email or web address].    Your next appointment:  AS SCHEDULED   Provider:  Burt Knack    Other Instructions

## 2022-09-04 ENCOUNTER — Telehealth (HOSPITAL_BASED_OUTPATIENT_CLINIC_OR_DEPARTMENT_OTHER): Payer: Self-pay | Admitting: Family Medicine

## 2022-09-04 NOTE — Telephone Encounter (Signed)
Called patient to schedule Medicare Annual Wellness Visit (AWV). No voicemail available to leave a message.  Last date of AWV: 06/08/2021   Please schedule an AWVS appointment at any time with [place].  If any questions, please contact me at [phone].    Thank you,  [place] Direct dial  [phone]

## 2022-09-07 ENCOUNTER — Other Ambulatory Visit: Payer: Self-pay | Admitting: Gastroenterology

## 2022-09-07 NOTE — Telephone Encounter (Signed)
This is a patient of Dr Ardis Hughs. Please advise if OK to refills as you are DOD pm.

## 2022-09-08 ENCOUNTER — Other Ambulatory Visit (HOSPITAL_BASED_OUTPATIENT_CLINIC_OR_DEPARTMENT_OTHER): Payer: Self-pay | Admitting: Family Medicine

## 2022-09-08 DIAGNOSIS — E1143 Type 2 diabetes mellitus with diabetic autonomic (poly)neuropathy: Secondary | ICD-10-CM

## 2022-09-08 MED ORDER — RYBELSUS 14 MG PO TABS: 14.0000 mg | ORAL_TABLET | Freq: Every day | ORAL | 3 refills | Status: DC

## 2022-09-15 ENCOUNTER — Ambulatory Visit
Admission: RE | Admit: 2022-09-15 | Discharge: 2022-09-15 | Disposition: A | Discharge status: Home / Self Care | Payer: PPO | Source: Ambulatory Visit | Attending: Diagnostic Neuroimaging | Admitting: Diagnostic Neuroimaging

## 2022-09-15 DIAGNOSIS — H814 Vertigo of central origin: Secondary | ICD-10-CM

## 2022-09-15 DIAGNOSIS — G450 Vertebro-basilar artery syndrome: Secondary | ICD-10-CM

## 2022-09-15 MED ORDER — GADOPICLENOL 0.5 MMOL/ML IV SOLN
9.0000 mL | Freq: Once | INTRAVENOUS | Status: AC | PRN
  Administered 2022-09-15: 9 mL via INTRAVENOUS

## 2022-09-18 ENCOUNTER — Telehealth (HOSPITAL_BASED_OUTPATIENT_CLINIC_OR_DEPARTMENT_OTHER): Payer: Self-pay | Admitting: Family Medicine

## 2022-09-18 NOTE — Telephone Encounter (Signed)
Contacted Jeff Lopez to schedule their annual wellness visit. Appointment made for 09/26/2022.  Thank you,  [place] Direct dial  [phone]

## 2022-09-19 ENCOUNTER — Encounter (HOSPITAL_BASED_OUTPATIENT_CLINIC_OR_DEPARTMENT_OTHER): Payer: Self-pay | Admitting: Family Medicine

## 2022-09-19 ENCOUNTER — Telehealth: Payer: Self-pay | Admitting: Neurology

## 2022-09-19 ENCOUNTER — Ambulatory Visit (INDEPENDENT_AMBULATORY_CARE_PROVIDER_SITE_OTHER): Payer: PPO | Admitting: Family Medicine

## 2022-09-19 DIAGNOSIS — R42 Dizziness and giddiness: Secondary | ICD-10-CM | POA: Diagnosis not present

## 2022-09-19 DIAGNOSIS — E1143 Type 2 diabetes mellitus with diabetic autonomic (poly)neuropathy: Secondary | ICD-10-CM | POA: Diagnosis not present

## 2022-09-19 MED ORDER — SEMAGLUTIDE 7 MG PO TABS: 1.0000 | ORAL_TABLET | Freq: Every day | ORAL | 1 refills | Status: DC

## 2022-09-19 NOTE — Assessment & Plan Note (Signed)
Patient was seen last month by Pecolia Ades here in our office related to acute concern of dizziness.  Due to concerns at that time, patient was referred to neurology for further evaluation.  He did have additional evaluation at that time and imaging completed including MR angiogram and MRI brain.  Results of imaging were reassuring.  Patient reports that he has had improvement in symptoms and currently is doing quite well. Reviewed prior office notes and imaging results.  Feel that symptoms were related to BPPV, imaging results reassuring in regards to excluding underlying structural abnormality including stroke or other acute neurologic issue. For now, can continue with monitoring for any recurrence of symptoms, discussed potential etiologies for reported symptoms should they return and discussed importance of seeking medical care in case of recurrence.  Patient voiced understanding.

## 2022-09-19 NOTE — Assessment & Plan Note (Signed)
Recent hemoglobin A1c shows that it has been gradually increasing.  Patient continues with Rybelsus as solo therapy.  Unfortunately, he continues to have notable GI side effects at higher dose of medication.  He did tolerate lower dose of 7 mg without missing notable GI upset, frequent bowel movements. Long discussion today regarding management considerations and treatment options including injectable GLP-1 receptor agonist, dose adjustment of current medication, switching to alternative class medication such as SGLT2 inhibitor and additional treatment considerations.  Discussed potential risk, benefits, side effects associated with each of these different medications and classes. After discussion, patient would prefer to continue with Rybelsus, however dropping to lower dose which was tolerated previously of 7 mg.  He would additionally like to continue with lifestyle modifications and plan for recheck of hemoglobin A1c with this current treatment. Feel this is reasonable, we will plan for recheck of hemoglobin A1c shortly before next appointment and assess response with adjusted treatment regimen

## 2022-09-19 NOTE — Telephone Encounter (Signed)
-----   Message from Penni Bombard, MD sent at 09/18/2022  5:12 PM EDT ----- Unremarkable imaging results. Please call patient. Continue current plan. -VRP

## 2022-09-19 NOTE — Telephone Encounter (Signed)
Called the patient to review the MRI brain results. There was no answer and no VM set up. Will have to attempt to call the patient another time.  **If pt returns call, please advise that Dr Leta Baptist reviewed the MRI brain results which overall looked good. He didn't see anything that appeared concerning.

## 2022-09-19 NOTE — Progress Notes (Signed)
    Procedures performed today:    None.  Independent interpretation of notes and tests performed by another provider:   None.  Brief History, Exam, Impression, and Recommendations:    BP 133/74 (BP Location: Right Arm, Patient Position: Sitting, Cuff Size: Large)   Pulse 69   Ht 5\' 9"  (1.753 m)   Wt 196 lb 9.6 oz (89.2 kg)   SpO2 99%   BMI 29.03 kg/m   Type II diabetes mellitus with peripheral autonomic neuropathy (HCC) Recent hemoglobin A1c shows that it has been gradually increasing.  Patient continues with Rybelsus as solo therapy.  Unfortunately, he continues to have notable GI side effects at higher dose of medication.  He did tolerate lower dose of 7 mg without missing notable GI upset, frequent bowel movements. Long discussion today regarding management considerations and treatment options including injectable GLP-1 receptor agonist, dose adjustment of current medication, switching to alternative class medication such as SGLT2 inhibitor and additional treatment considerations.  Discussed potential risk, benefits, side effects associated with each of these different medications and classes. After discussion, patient would prefer to continue with Rybelsus, however dropping to lower dose which was tolerated previously of 7 mg.  He would additionally like to continue with lifestyle modifications and plan for recheck of hemoglobin A1c with this current treatment. Feel this is reasonable, we will plan for recheck of hemoglobin A1c shortly before next appointment and assess response with adjusted treatment regimen  Dizziness Patient was seen last month by Pecolia Ades here in our office related to acute concern of dizziness.  Due to concerns at that time, patient was referred to neurology for further evaluation.  He did have additional evaluation at that time and imaging completed including MR angiogram and MRI brain.  Results of imaging were reassuring.  Patient reports that he has had  improvement in symptoms and currently is doing quite well. Reviewed prior office notes and imaging results.  Feel that symptoms were related to BPPV, imaging results reassuring in regards to excluding underlying structural abnormality including stroke or other acute neurologic issue. For now, can continue with monitoring for any recurrence of symptoms, discussed potential etiologies for reported symptoms should they return and discussed importance of seeking medical care in case of recurrence.  Patient voiced understanding.  Return in about 2 months (around 11/19/2022) for DM.  Spent 43 minutes on this patient encounter, including preparation, chart review, face-to-face counseling with patient and coordination of care, and documentation of encounter   ___________________________________________ Shakthi Scip[place] de [place], MD, ABFM, [facility] and [place]

## 2022-09-25 ENCOUNTER — Telehealth: Payer: Self-pay

## 2022-09-25 ENCOUNTER — Ambulatory Visit (HOSPITAL_BASED_OUTPATIENT_CLINIC_OR_DEPARTMENT_OTHER): Payer: PPO

## 2022-09-25 NOTE — Telephone Encounter (Signed)
-----   Message from Vikram R Penumalli, MD sent at 09/18/2022  5:12 PM EDT ----- Unremarkable imaging results. Please call patient. Continue current plan. -VRP 

## 2022-09-25 NOTE — Telephone Encounter (Signed)
Called and was unable to leave msg stating their results from mri were normal or unremarkable. It rang a few times then stated that my call couldn't be completed at this time.

## 2022-09-25 NOTE — Telephone Encounter (Signed)
Pt called back. I informed him of message nurse [facility] sent. Called and was unable to leave msg stating their results from mri were normal or unremarkable. Pt said okay and thank you for letting me know.

## 2022-09-26 ENCOUNTER — Ambulatory Visit (INDEPENDENT_AMBULATORY_CARE_PROVIDER_SITE_OTHER): Payer: PPO

## 2022-09-26 ENCOUNTER — Encounter (HOSPITAL_BASED_OUTPATIENT_CLINIC_OR_DEPARTMENT_OTHER): Payer: Self-pay

## 2022-09-26 VITALS — Ht 69.0 in | Wt 196.0 lb

## 2022-09-26 DIAGNOSIS — Z0000 Encounter for general adult medical examination without abnormal findings: Secondary | ICD-10-CM | POA: Diagnosis not present

## 2022-09-26 NOTE — Progress Notes (Addendum)
Subjective:   Jeff Lopez is a 72 y.o. male who presents for Medicare Annual/Subsequent preventive examination.  Review of Systems    Virtual Visit via Telephone Note  I connected with  Jeff Lopez on 09/26/22 at 11:30 AM EDT by telephone and verified that I am speaking with the correct person using two identifiers.  Location: Patient: Home Provider: Office Persons participating in the virtual visit: patient/Nurse Health Advisor   I discussed the limitations, risks, security and privacy concerns of performing an evaluation and management service by telephone and the availability of in person appointments. The patient expressed understanding and agreed to proceed.  Interactive audio and video telecommunications were attempted between this nurse and patient, however failed, due to patient having technical difficulties OR patient did not have access to video capability.  We continued and completed visit with audio only.  Some vital signs may be absent or patient reported.   Criselda Peaches, LPN  Cardiac Risk Factors include: advanced age (>29men, >23 women);diabetes mellitus;hypertension;male gender     Objective:    Today's Vitals   09/26/22 1130  Weight: 196 lb (88.9 kg)  Height: 5\' 9"  (1.753 m)   Body mass index is 28.94 kg/m.     09/26/2022   11:39 AM 07/03/2022    7:04 AM 10/20/2021    9:00 PM 06/08/2021    9:14 AM 05/28/2020    9:26 AM 04/26/2018    9:18 AM 04/05/2017    9:14 AM  Advanced Directives  Does Patient Have a Medical Advance Directive? Yes Yes No Yes Yes Yes No  Type of Paramedic of [place];Living will [place];Living will  Living will Living will;Healthcare Power of Attorney    Does patient want to make changes to medical advance directive?  No - Patient declined  No - Patient declined No - Patient declined    Copy of Eva in Chart? No - copy requested No - copy requested   No  - copy requested    Would patient like information on creating a medical advance directive?   No - Patient declined        Current Medications (verified) Outpatient Encounter Medications as of 09/26/2022  Medication Sig   amLODipine-olmesartan (AZOR) 5-40 MG tablet TAKE 1 TABLET BY MOUTH EVERY DAY   aspirin EC 81 MG tablet Take 81 mg by mouth daily. Swallow whole.   cholecalciferol (VITAMIN D3) 25 MCG (1000 UT) tablet Take 1,000 Units by mouth daily.   clopidogrel (PLAVIX) 75 MG tablet Take 1 tablet (75 mg total) by mouth daily.   eszopiclone (LUNESTA) 2 MG TABS tablet TAKE 1 TABLET BY MOUTH NIGHTLY AT BEDTIME AS NEEDED FOR SLEEP. take immediately BEFORE bedtime   levothyroxine (SYNTHROID) 137 MCG tablet TAKE 1 TABLET BY MOUTH EVERY DAY BEFORE BREAKFAST   metoprolol succinate (TOPROL-XL) 50 MG 24 hr tablet TAKE 1 TABLET BY MOUTH EVERY DAY WITH OR IMMEDIATELY FOLLOWING A MEAL (Patient taking differently: Take 50 mg by mouth daily.)   pantoprazole (PROTONIX) 40 MG tablet TAKE 1 TABLET BY MOUTH 2 TIMES DAILY BEFORE A MEAL   rosuvastatin (CRESTOR) 10 MG tablet Take 1 tablet (10 mg total) by mouth daily.   Semaglutide 7 MG TABS Take 1 tablet (7 mg total) by mouth daily.   No facility-administered encounter medications on file as of 09/26/2022.    Allergies (verified) Ceftin, Cefuroxime axetil, and Cephalexin   History: Past Medical History:  Diagnosis Date  Allergy    per pt   CAD, NATIVE VESSEL 04/19/2010   nonobstructive by cath 2007:  oLAD 20-30%, mLAD 50%, pCFX 20-30%, oAVCFX 20-30%, L renal art 50%;  normal LVF   Cataract    bil cataracts removed   COPD (chronic obstructive pulmonary disease)    Diabetes mellitus without complication    GERD [id number]   Headache(784.0)    HYPERLIPIDEMIA 04/09/2007   Patient denies.   HYPERTENSION, BENIGN 04/19/2010   Hypothyroidism    Hypothyroidism    previous hyperthyroidism, s/p I-131   LUMBAR DISC DISORDER 05/27/2010   Nerve damage     Left leg   OSTEOARTHRITIS, LUMBAR SPINE 04/09/2007   RENAL CYST 05/27/2010   Spinal headache    After having back surgery in 2014   Past Surgical History:  Procedure Laterality Date   ABDOMINAL AORTOGRAM W/LOWER EXTREMITY N/A 07/03/2022   Procedure: ABDOMINAL AORTOGRAM W/LOWER EXTREMITY;  Surgeon: Waynetta Sandy, MD;  Location: [facility];  Service: Cardiovascular;  Laterality: N/A;   BACK SURGERY  2012,2014   CARDIAC CATHETERIZATION  2007   Dr Loanne Drilling   CHOLECYSTECTOMY     CHOLECYSTECTOMY N/A 07/13/2013   Procedure: LAPAROSCOPIC CHOLECYSTECTOMY WITH INTRAOPERATIVE CHOLANGIOGRAM;  Surgeon: Shann Medal, MD;  Location: [facility];  Service: General;  Laterality: N/A;   COLONOSCOPY  2018   COLONOSCOPY W/ POLYPECTOMY     ERCP N/A 07/14/2013   Procedure: ENDOSCOPIC RETROGRADE CHOLANGIOPANCREATOGRAPHY (ERCP);  Surgeon: [name], MD;  Location: [facility];  Service: Endoscopy;  Laterality: N/A;   EYE SURGERY Bilateral    Cataract removal   FOOT FRACTURE SURGERY     FRACTURE SURGERY     LUMBAR [place] SURGERY  08/06/2012   L3 & L4   LUMBAR DISC SURGERY  11/11/2015   L1 L2    DR NITKA   LUMBAR FUSION N/A 04/20/2015   Procedure: T12 to L1 fusion (Extension of Previous Fusion L2-S1 to T12-S1), Right Transforaminal lumbar interbody fusion, Posterior Fusion T12 to L1, with Pedicle screws, allograft, local bone graft, and  Vivigen;  Surgeon: [name], MD;  Location: [facility];  Service: Orthopedics;  Laterality: N/A;   LUMBAR LAMINECTOMY N/A 11/10/2013   Procedure: Left L1-2 far lateral approach to excise herniated nucleus pulposus;  Surgeon: [name], MD;  Location: [facility];  Service: Orthopedics;  Laterality: N/A;   LUMBAR LAMINECTOMY/DECOMPRESSION MICRODISCECTOMY Left 08/10/2012   Procedure: Dura Repair Left Side L2-L3;  Surgeon: [name], MD;  Location: [facility];  Service: Orthopedics;  Laterality: Left;  Shmuel Girgis Frame, Sliding table, dura repair kit, microscope   NECK  SURGERY     X 2   PERIPHERAL VASCULAR INTERVENTION Left 07/03/2022   Procedure: PERIPHERAL VASCULAR INTERVENTION;  Surgeon: Waynetta Sandy, MD;  Location: [facility];  Service: Cardiovascular;  Laterality: Left;  fem-pop   SINOSCOPY     SPINE SURGERY  2006   C-spine surgery x 2   UPPER GASTROINTESTINAL ENDOSCOPY     Family History  Problem Relation Age of Onset   Breast cancer Mother    Uterine cancer Mother    Heart disease Mother    Heart attack Mother 57   Prostate cancer Father    Hypertension Brother    Non-Hodgkin's lymphoma Brother    Stomach cancer Paternal Grandmother    Thyroid disease Neg Hx    Colon cancer Neg Hx    Esophageal cancer Neg Hx    Rectal cancer Neg Hx  Pancreatic cancer Neg Hx    Colon polyps Neg Hx    Social History   Socioeconomic History   Marital status: Single    Spouse name: Not on file   Number of children: 1   Years of education: Not on file   Highest education level: Not on file  Occupational History   Occupation: DISTRIBUTION MGR    Employer: [employer]  Tobacco Use   Smoking status: Former    Packs/day: 1.00    Years: 37.00    Additional pack years: 0.00    Total pack years: 37.00    Types: Cigarettes    Quit date: 04/06/2008    Years since quitting: 14.4   Smokeless tobacco: Never   Tobacco comments:    pt has stopped smoking about 9 months now  Vaping Use   Vaping Use: Never used  Substance and Sexual Activity   Alcohol use: No   Drug use: No   Sexual activity: Not Currently  Other Topics Concern   Not on file  Social History Narrative   Works in Psychologist, educational. Retired.   Lives alone, has one child in [place].    Social Determinants of Health   Financial Resource Strain: Low Risk  (09/26/2022)   Overall Financial Resource Strain (CARDIA)    Difficulty of Paying Living Expenses: Not hard at all  Food Insecurity: No Food Insecurity (09/26/2022)   Hunger Vital Sign    Worried About  Running Out of Food in the Last Year: Never true    Ran Out of Food in the Last Year: Never true  Transportation Needs: No Transportation Needs (09/26/2022)   PRAPARE - Hydrologist (Medical): No    Lack of Transportation (Non-Medical): No  Physical Activity: Sufficiently Active (09/26/2022)   Exercise Vital Sign    Days of Exercise per Week: 5 days    Minutes of Exercise per Session: 30 min  Stress: No Stress Concern Present (09/26/2022)   [place]    Feeling of Stress : Not at all  Social Connections: Socially Isolated (09/26/2022)   Social Connection and Isolation Panel [NHANES]    Frequency of Communication with Friends and Family: More than three times a week    Frequency of Social Gatherings with Friends and Family: More than three times a week    Attends Religious Services: Never    Marine scientist or Organizations: No    Attends Music therapist: Never    Marital Status: Never married    Tobacco Counseling Counseling given: Not Answered Tobacco comments: pt has stopped smoking about 9 months now   Clinical Intake:  Pre-visit preparation completed: No  Pain : No/denies pain Nutrition Risk Assessment:  Has the patient had any N/V/D within the last 2 months?  No  Does the patient have any non-healing wounds?  No  Has the patient had any unintentional weight loss or weight gain?  No   Diabetes:  Is the patient diabetic?  Yes  If diabetic, was a CBG obtained today?  No  Did the patient bring in their glucometer from home?  No  How often do you monitor your CBG's? PRN.   Financial Strains and Diabetes Management:  Are you having any financial strains with the device, your supplies or your medication? No .  Does the patient want to be seen by Chronic Care Management for management of their diabetes?  No  Would  the patient like to be referred to a Nutritionist  or for Diabetic Management?  No   Diabetic Exams:  Diabetic Eye Exam: Completed Yes. Overdue for diabetic eye exam. Pt has been advised about the importance in completing this exam. A referral has been placed today. Message sent to referral coordinator for scheduling purposes. Advised pt to expect a call from office referred to regarding appt.  Diabetic Foot Exam: Completed Yes. Pt has been advised about the importance in completing this exam. Pt is scheduled for diabetic foot exam on Followed by PCP.      BMI - recorded: 28.94 Nutritional Status: BMI 25 -29 Overweight Nutritional Risks: None Diabetes: Yes CBG done?: No Did pt. bring in CBG monitor from home?: No  How often do you need to have someone help you when you read instructions, pamphlets, or other written materials from your doctor or pharmacy?: 1 - Never  Diabetic? Yes  Interpreter Needed?: No  Information entered by :: Rolene Arbour LPN   Activities of Daily Living    09/26/2022   11:38 AM 09/19/2022   10:24 AM  In your present state of health, do you have any difficulty performing the following activities:  Hearing? 0 0  Vision? 0 0  Difficulty concentrating or making decisions? 0 0  Walking or climbing stairs? 0 0  Dressing or bathing? 0 0  Doing errands, shopping? 0 0  Preparing Food and eating ? N   Using the Toilet? N   In the past six months, have you accidently leaked urine? N   Do you have problems with loss of bowel control? N   Managing your Medications? N   Managing your Finances? N   Housekeeping or managing your Housekeeping? N     Patient Care Team: [place], Blondell Reveal, MD as PCP - General (Family Medicine) Sherren Mocha, MD as PCP - Cardiology (Cardiology) Alphonsa Overall, MD as Consulting Physician (General Surgery) Earnie Larsson, [facility] as Pharmacist (Pharmacist)  Indicate any recent Medical Services you may have received from other than [facility] providers in the past year (date may be  approximate).     Assessment:   This is a routine wellness examination for [place].  Hearing/Vision screen Hearing Screening - Comments:: Denies hearing difficulties   Vision Screening - Comments:: Wears rx glasses - up to date with routine eye exams with  [place] issues and exercise activities discussed: Exercise limited by: None identified   Goals Addressed               This Visit's Progress     No current goals (pt-stated)         Depression Screen    09/26/2022   11:34 AM 09/19/2022   10:24 AM 08/22/2022   10:00 AM 05/09/2022    1:27 PM 03/31/2022   10:26 AM 02/15/2022    9:57 AM 12/16/2021   11:36 AM  PHQ 2/9 Scores  PHQ - 2 Score 0 0 0 0 1 2 1   PHQ- 9 Score 0 0 0 0 6 15 7   Exception Documentation  Medical reason Medical reason Medical reason Medical reason      Fall Risk    09/26/2022   11:38 AM 09/19/2022   10:24 AM 08/22/2022   10:00 AM 05/09/2022    1:27 PM 03/31/2022   10:26 AM  [place] in the past year? 0 0 0 0 0  Number falls in past yr: 0 0 0  0 0  Injury with Fall? 0 0 0 0 0  Risk for fall due to : No Fall Risks No Fall Risks No Fall Risks No Fall Risks No Fall Risks  Follow up Falls prevention discussed Falls evaluation completed Falls evaluation completed Falls evaluation completed Falls evaluation completed    [place]:  Any stairs in or around the home? Yes  If so, are there any without handrails? No  Home free of loose throw rugs in walkways, pet beds, electrical cords, etc? Yes  Adequate lighting in your home to reduce risk of falls? Yes   ASSISTIVE DEVICES UTILIZED TO PREVENT FALLS:  Life alert? No  Use of a cane, walker or w/c? No  Grab bars in the bathroom? No  Shower chair or bench in shower? No  Elevated toilet seat or a handicapped toilet? No   TIMED UP AND GO:  Was the test performed? No . Audio Visit   Cognitive Function:        09/26/2022   11:39 AM  06/08/2021    9:14 AM  6CIT Screen  What Year? 0 points 0 points  What month? 0 points 0 points  What time? 0 points 0 points  Count back from 20 0 points 0 points  Months in reverse 0 points 0 points  Repeat phrase 0 points 0 points  Total Score 0 points 0 points    Immunizations Immunization History  Administered Date(s) Administered   Fluad Quad(high Dose 65+) 04/08/2019, 03/09/2021, 05/29/2022   Influenza Split 03/16/2011, 04/12/2012   Influenza Whole 04/23/2010   Influenza, High Dose Seasonal PF 02/15/2015   Influenza,inj,Quad PF,6+ Mos 03/19/2014, 04/04/2018   Influenza-Unspecified 04/08/2013, 04/13/2019, 04/16/2020   PFIZER(Purple Top)SARS-COV-2 Vaccination 09/24/2019, 10/09/2019, 04/16/2020, 05/18/2021   Pneumococcal Conjugate-13 02/12/2014   Pneumococcal Polysaccharide-23 05/27/2010, 04/29/2018   Td 01/24/2006   Tdap 02/28/2018   Varicella 05/10/2012   Zoster Recombinat (Shingrix) 01/29/2017, 03/08/2017    TDAP status: Up to date  Flu Vaccine status: Up to date  Pneumococcal vaccine status: Up to date  Covid-19 vaccine status: Completed vaccines  Qualifies for Shingles Vaccine? Yes   Zostavax completed Yes   Shingrix Completed?: Yes  Screening Tests Health Maintenance  Topic Date Due   OPHTHALMOLOGY EXAM  09/26/2022 (Originally 06/29/2022)   COVID-19 Vaccine (5 - 2023-24 season) 10/12/2022 (Originally 02/24/2022)   Diabetic kidney evaluation - Urine ACR  12/08/2022   FOOT EXAM  12/17/2022   INFLUENZA VACCINE  01/25/2023   Lung Cancer Screening  02/04/2023   HEMOGLOBIN A1C  02/22/2023   Diabetic kidney evaluation - eGFR measurement  07/05/2023   Medicare Annual Wellness (AWV)  09/26/2023   COLONOSCOPY (Pts 45-38yrs Insurance coverage will need to be confirmed)  10/29/2026   DTaP/Tdap/Td (3 - Td or Tdap) 02/29/2028   Pneumonia Vaccine 49+ Years old  Completed   Hepatitis C Screening  Completed   Zoster Vaccines- Shingrix  Completed   HPV VACCINES  Aged  Out    Health Maintenance  There are no preventive care reminders to display for this patient.   Colorectal cancer screening: Type of screening: Colonoscopy. Completed 10/29/19. Repeat every 7 years  Lung Cancer Screening: (Low Dose CT Chest recommended if Age 57-80 years, 30 pack-year currently smoking OR have quit w/in 15years.) does not qualify.    Additional Screening:  Hepatitis C Screening: does qualify; Completed 06/05/16  Vision Screening: Recommended annual ophthalmology exams for early detection of glaucoma and other disorders of the  eye. Is the patient up to date with their annual eye exam?  Yes  Who is the provider or what is the name of the office in which the patient attends annual eye exams? [facility] If pt is not established with a provider, would they like to be referred to a provider to establish care? No .   Dental Screening: Recommended annual dental exams for proper oral hygiene  Community Resource Referral / Chronic Care Management:  CRR required this visit?  No   CCM required this visit?  No      Plan:     I have personally reviewed and noted the following in the patient's chart:   Medical and social history Use of alcohol, tobacco or illicit drugs  Current medications and supplements including opioid prescriptions. Patient is not currently taking opioid prescriptions. Functional ability and status Nutritional status Physical activity Advanced directives List of other physicians Hospitalizations, surgeries, and ER visits in previous 12 months Vitals Screenings to include cognitive, depression, and falls Referrals and appointments  In addition, I have reviewed and discussed with patient certain preventive protocols, quality metrics, and best practice recommendations. A written personalized care plan for preventive services as well as general preventive health recommendations were provided to patient.     Criselda Peaches,  LPN   [id number]   Nurse Notes: None

## 2022-09-26 NOTE — Patient Instructions (Addendum)
Jeff Lopez , Thank you for taking time to come for your Medicare Wellness Visit. I appreciate your ongoing commitment to your health goals. Please review the following plan we discussed and let me know if I can assist you in the future.   These are the goals we discussed:  Goals       DIET - DECREASE SODA OR JUICE INTAKE      Exercise 3x per week (30 min per time)      HEMOGLOBIN A1C < 7      No current goals (pt-stated)      Pharmacy Care Plan      CARE PLAN ENTRY  Current Barriers:  Chronic Disease Management support, education, and care coordination needs related to HTN, HLD/ atherosclerosis of native coronary artery of native heart, DMII, and Hypothyroidism, GERD, Vitamin D deficiency, Insomnia  Pharmacist Clinical Goal(s):  Work with the care management team to address educational, disease management, and care coordination needs  Call provider office for new or worsened signs and symptoms  Continue self health monitoring activities as directed today (blood pressure monitoring)  Call care management team with questions or concerns.  Diabetes:  Achieve A1c <7.0% or 6.5% (based on your provider). Recent A1c: 7.5 (07/24/2019) Within this year, obtain diabetes preventative health exams (eye and foot exams yearly). Blood pressure:  Maintain Blood pressure <130/80 mmHg.  Recent blood pressure reading: 128/84 mmHg.  Maintain low salt diet.  High cholesterol:  Cholesterol goals: Total Cholesterol goal under 200, Triglycerides goal under 150, HDL goal above 40 (men) or above 50 (women), LDL goal under 70.  Recent cholesterol levels (02/12/2019) Total cholesterol: 161 Triglycerides: 112 HDL: 42.50 LDL: 96 Hypothyroidism Maintain TSH between 0.45 to 4.5uIU/ml. Current TSH: 2.85 (02/12/2019) GERD/ heartburn Minimize reflux symptoms.  Vitamin D deficiency Maintain Vitamin D-25 level at or above 30.  Last Vitamin D-25 (10/03/2017): 24.43 Insomnia Continue to see improvement in  insomnia.   Interventions: Comprehensive medication review performed. Lifestyle modifications Engaging in at least 150 minutes per week of moderate-intensity exercise such as brisk walking (15- to 20-minute mile) or something similar. Recommended they incorporate flexibility, balance, and some type of strength training exercises.  Diabetes Discussed importance of diabetes preventative health exams. Obtain diabetic eye exam every 1 to 2 years.  Obtain at least once yearly foot exams. Continue: metformin ER 500mg , 1 tablet once daily with breakfast  Blood pressure:  Discussed need to continue checking blood pressure at home.  Discussed diet modifications. DASH diet:  following a diet emphasizing fruits and vegetables and low-fat dairy products along with whole grains, fish, poultry, and nuts. Reducing red meats and sugars.  Exercising- see exercising section above Reducing the amount of salt intake to 1500mg /per day.  Recommend using a salt substitute to replace your salt if you need flavor.      Weight reduction- We discussed losing 5-10% of body weight Continue:  amlodipine 5mg , 1 tablet once daily  irbesartan 300mg , 1 tablet once daily metoprolol succinate 50mg , 1 tablet once daily  Cholesterol/ atherosclerosis of native coronary artery of native heart How to reduce cholesterol through diet/weight management and physical activity.    We discussed how a diet high in plant sterols (fruits/vegetables/nuts/whole grains/legumes) may reduce your cholesterol.  Encouraged increasing fiber to a daily intake of 10-25g/day  Continue: diet modifications Obtain cholesterol blood work (lipid panel) on 10/20/2019 and discuss addition of statins at your next visit with Dr. Ethlyn Gallery on 10/27/2019.  Hypothyroidism Continue: levothyroxine 112mcg, 1 tablet  once daily before breakfast  Obtain blood work for TSH on 10/20/2019 and for reassessment of levels and medication. Insomnia:  Discussed  non-pharmacological interventions for insomnia. (Avoid napping, limit exposure to technology near bedtime, etc) Continue: to work on sleep hygiene.  GERD/ heart burn: Discussed non-pharmacological interventions for acid reflux. Take measures to prevent acid reflux, such as avoiding spicy foods, avoiding caffeine, avoid laying down a few hours after eating, and raising the head of the bed. Continue: pantoprazole 40mg , 1 tablet twice daily (or as directed by your provider) Vitamin D deficiency Continue: cholecalciferol (Vitamin D3) 65mcg (1000 units), 1 capsule once daily  Obtain blood work for vitamin D on 10/20/2019 and for reassessment of levels and medication.   Patient Self Care Activities:  Self administers medications as prescribed and Calls provider office for new concerns or questions Continue current medications as directed by providers.  Continue following up with primary care provider and/or specialists. Continue at home blood pressure readings. Continue working on health habits (diet/ exercise).  Initial goal documentation         This is a list of the screening recommended for you and due dates:  Health Maintenance  Topic Date Due   Eye exam for diabetics  09/26/2022*   COVID-19 Vaccine (5 - 2023-24 season) 10/12/2022*   Yearly kidney health urinalysis for diabetes  12/08/2022   Complete foot exam   12/17/2022   Flu Shot  01/25/2023   Screening for Lung Cancer  02/04/2023   Hemoglobin A1C  02/22/2023   Yearly kidney function blood test for diabetes  07/05/2023   Medicare Annual Wellness Visit  09/26/2023   Colon Cancer Screening  10/29/2026   DTaP/Tdap/Td vaccine (3 - Td or Tdap) 02/29/2028   Pneumonia Vaccine  Completed   Hepatitis C Screening: USPSTF Recommendation to screen - Ages 47-79 yo.  Completed   Zoster (Shingles) Vaccine  Completed   HPV Vaccine  Aged Out  *Topic was postponed. The date shown is not the original due date.    Advanced directives:  Please bring a copy of your health care power of attorney and living will to the office to be added to your chart at your convenience.   Conditions/risks identified: None  Next appointment: Follow up in one year for your annual wellness visit.   Preventive Care 33 Years and Older, Male  Preventive care refers to lifestyle choices and visits with your health care provider that can promote health and wellness. What does preventive care include? A yearly physical exam. This is also called an annual well check. Dental exams once or twice a year. Routine eye exams. Ask your health care provider how often you should have your eyes checked. Personal lifestyle choices, including: Daily care of your teeth and gums. Regular physical activity. Eating a healthy diet. Avoiding tobacco and drug use. Limiting alcohol use. Practicing safe sex. Taking low doses of aspirin every day. Taking vitamin and mineral supplements as recommended by your health care provider. What happens during an annual well check? The services and screenings done by your health care provider during your annual well check will depend on your age, overall health, lifestyle risk factors, and family history of disease. Counseling  Your health care provider may ask you questions about your: Alcohol use. Tobacco use. Drug use. Emotional well-being. Home and relationship well-being. Sexual activity. Eating habits. History of falls. Memory and ability to understand (cognition). Work and work Statistician. Screening  You may have the following tests or measurements:  Height, weight, and BMI. Blood pressure. Lipid and cholesterol levels. These may be checked every 5 years, or more frequently if you are over 30 years old. Skin check. Lung cancer screening. You may have this screening every year starting at age 20 if you have a 30-pack-year history of smoking and currently smoke or have quit within the past 15 years. Fecal  occult blood test (FOBT) of the stool. You may have this test every year starting at age 106. Flexible sigmoidoscopy or colonoscopy. You may have a sigmoidoscopy every 5 years or a colonoscopy every 10 years starting at age 77. Prostate cancer screening. Recommendations will vary depending on your family history and other risks. Hepatitis C blood test. Hepatitis B blood test. Sexually transmitted disease (STD) testing. Diabetes screening. This is done by checking your blood sugar (glucose) after you have not eaten for a while (fasting). You may have this done every 1-3 years. Abdominal aortic aneurysm (AAA) screening. You may need this if you are a current or former smoker. Osteoporosis. You may be screened starting at age 76 if you are at high risk. Talk with your health care provider about your test results, treatment options, and if necessary, the need for more tests. Vaccines  Your health care provider may recommend certain vaccines, such as: Influenza vaccine. This is recommended every year. Tetanus, diphtheria, and acellular pertussis (Tdap, Td) vaccine. You may need a Td booster every 10 years. Zoster vaccine. You may need this after age 23. Pneumococcal 13-valent conjugate (PCV13) vaccine. One dose is recommended after age 63. Pneumococcal polysaccharide (PPSV23) vaccine. One dose is recommended after age 25. Talk to your health care provider about which screenings and vaccines you need and how often you need them. This information is not intended to replace advice given to you by your health care provider. Make sure you discuss any questions you have with your health care provider. Document Released: 07/09/2015 Document Revised: 03/01/2016 Document Reviewed: 04/13/2015 Elsevier Interactive Patient Education  2017 [place] Prevention in the Home Falls can cause injuries. They can happen to people of all ages. There are many things you can do to make your home safe and to  help prevent falls. What can I do on the outside of my home? Regularly fix the edges of walkways and driveways and fix any cracks. Remove anything that might make you trip as you walk through a door, such as a raised step or threshold. Trim any bushes or trees on the path to your home. Use bright outdoor lighting. Clear any walking paths of anything that might make someone trip, such as rocks or tools. Regularly check to see if handrails are loose or broken. Make sure that both sides of any steps have handrails. Any raised decks and porches should have guardrails on the edges. Have any leaves, snow, or ice cleared regularly. Use sand or salt on walking paths during winter. Clean up any spills in your garage right away. This includes oil or grease spills. What can I do in the bathroom? Use night lights. Install grab bars by the toilet and in the tub and shower. Do not use towel bars as grab bars. Use non-skid mats or decals in the tub or shower. If you need to sit down in the shower, use a plastic, non-slip stool. Keep the floor dry. Clean up any water that spills on the floor as soon as it happens. Remove soap buildup in the tub or shower regularly. Attach bath mats  securely with double-sided non-slip rug tape. Do not have throw rugs and other things on the floor that can make you trip. What can I do in the bedroom? Use night lights. Make sure that you have a light by your bed that is easy to reach. Do not use any sheets or blankets that are too big for your bed. They should not hang down onto the floor. Have a firm chair that has side arms. You can use this for support while you get dressed. Do not have throw rugs and other things on the floor that can make you trip. What can I do in the kitchen? Clean up any spills right away. Avoid walking on wet floors. Keep items that you use a lot in easy-to-reach places. If you need to reach something above you, use a strong step stool that has  a grab bar. Keep electrical cords out of the way. Do not use floor polish or wax that makes floors slippery. If you must use wax, use non-skid floor wax. Do not have throw rugs and other things on the floor that can make you trip. What can I do with my stairs? Do not leave any items on the stairs. Make sure that there are handrails on both sides of the stairs and use them. Fix handrails that are broken or loose. Make sure that handrails are as long as the stairways. Check any carpeting to make sure that it is firmly attached to the stairs. Fix any carpet that is loose or worn. Avoid having throw rugs at the top or bottom of the stairs. If you do have throw rugs, attach them to the floor with carpet tape. Make sure that you have a light switch at the top of the stairs and the bottom of the stairs. If you do not have them, ask someone to add them for you. What else can I do to help prevent falls? Wear shoes that: Do not have high heels. Have rubber bottoms. Are comfortable and fit you well. Are closed at the toe. Do not wear sandals. If you use a stepladder: Make sure that it is fully opened. Do not climb a closed stepladder. Make sure that both sides of the stepladder are locked into place. Ask someone to hold it for you, if possible. Clearly mark and make sure that you can see: Any grab bars or handrails. First and last steps. Where the edge of each step is. Use tools that help you move around (mobility aids) if they are needed. These include: Canes. Walkers. Scooters. Crutches. Turn on the lights when you go into a dark area. Replace any light bulbs as soon as they burn out. Set up your furniture so you have a clear path. Avoid moving your furniture around. If any of your floors are uneven, fix them. If there are any pets around you, be aware of where they are. Review your medicines with your doctor. Some medicines can make you feel dizzy. This can increase your chance of  falling. Ask your doctor what other things that you can do to help prevent falls. This information is not intended to replace advice given to you by your health care provider. Make sure you discuss any questions you have with your health care provider. Document Released: 04/08/2009 Document Revised: 11/18/2015 Document Reviewed: 07/17/2014 Elsevier Interactive Patient Education  2017 [employer].

## 2022-09-27 ENCOUNTER — Other Ambulatory Visit: Payer: Self-pay | Admitting: Cardiovascular Disease

## 2022-09-28 ENCOUNTER — Other Ambulatory Visit: Payer: Self-pay | Admitting: Family Medicine

## 2022-10-02 ENCOUNTER — Encounter (HOSPITAL_BASED_OUTPATIENT_CLINIC_OR_DEPARTMENT_OTHER): Payer: PPO | Admitting: Family Medicine

## 2022-10-02 ENCOUNTER — Other Ambulatory Visit (HOSPITAL_BASED_OUTPATIENT_CLINIC_OR_DEPARTMENT_OTHER): Payer: Self-pay

## 2022-10-02 MED ORDER — ESZOPICLONE 2 MG PO TABS: ORAL_TABLET | ORAL | 1 refills | Status: DC

## 2022-10-02 MED ORDER — ESZOPICLONE 2 MG PO TABS: ORAL_TABLET | ORAL | 0 refills | Status: DC

## 2022-10-09 ENCOUNTER — Ambulatory Visit (HOSPITAL_BASED_OUTPATIENT_CLINIC_OR_DEPARTMENT_OTHER): Payer: PPO

## 2022-10-09 ENCOUNTER — Ambulatory Visit: Payer: PPO | Attending: Cardiovascular Disease | Admitting: Cardiovascular Disease

## 2022-10-09 ENCOUNTER — Encounter: Payer: Self-pay | Admitting: Cardiovascular Disease

## 2022-10-09 ENCOUNTER — Other Ambulatory Visit: Payer: Self-pay | Admitting: Family Medicine

## 2022-10-09 VITALS — BP 130/90 | HR 60 | Ht 69.0 in | Wt 201.0 lb

## 2022-10-09 DIAGNOSIS — E782 Mixed hyperlipidemia: Secondary | ICD-10-CM

## 2022-10-09 DIAGNOSIS — R079 Chest pain, unspecified: Secondary | ICD-10-CM | POA: Insufficient documentation

## 2022-10-09 DIAGNOSIS — I70212 Atherosclerosis of native arteries of extremities with intermittent claudication, left leg: Secondary | ICD-10-CM

## 2022-10-09 DIAGNOSIS — I2510 Atherosclerotic heart disease of native coronary artery without angina pectoris: Secondary | ICD-10-CM | POA: Diagnosis present

## 2022-10-09 DIAGNOSIS — I10 Essential (primary) hypertension: Secondary | ICD-10-CM

## 2022-10-09 LAB — ECHOCARDIOGRAM COMPLETE
Area-P 1/2: 2.33 cm2
P 1/2 time: 493 msec
S' Lateral: 3.7 cm

## 2022-10-09 NOTE — Patient Instructions (Signed)
Medication Instructions:  Your physician recommends that you continue on your current medications as directed. Please refer to the Current Medication list given to you today.  *If you need a refill on your cardiac medications before your next appointment, please call your pharmacy*   Lab Work: NONE If you have labs (blood work) drawn today and your tests are completely normal, you will receive your results only by: MyChart Message (if you have MyChart) OR A paper copy in the mail If you have any lab test that is abnormal or we need to change your treatment, we will call you to review the results.   Testing/Procedures: NONE   Follow-Up: At [facility], you and your health needs are our priority.  As part of our continuing mission to provide you with exceptional heart care, we have created designated Provider Care Teams.  These Care Teams include your primary Cardiologist (physician) and Advanced Practice Providers (APPs -  Physician Assistants and Nurse Practitioners) who all work together to provide you with the care you need, when you need it.  We recommend signing up for the patient portal called "MyChart".  Sign up information is provided on this After Visit Summary.  MyChart is used to connect with patients for Virtual Visits (Telemedicine).  Patients are able to view lab/test results, encounter notes, upcoming appointments, etc.  Non-urgent messages can be sent to your provider as well.   To learn more about what you can do with MyChart, go to [email or web address].    Your next appointment:   1 year(s)  Provider:   Michael Cooper, MD      

## 2022-10-09 NOTE — Progress Notes (Unsigned)
Cardiology Office Note:    Date:  10/10/2022   ID:  Jeff Lopez, DOB 05-06-1951, MRN [id number]  PCP:  de [place], Raymond J, MD   [facility] Providers Cardiologist:  Tonny Bollman, MD     Referring MD: de [place], Buren Kos, MD   Chief Complaint  Patient presents with   Coronary Artery Disease    History of Present Illness:    Jeff Lopez is a 72 y.o. male with a hx of nonobstructive coronary artery disease, hypertension, type 2 diabetes, former smoker quit in 2009, peripheral arterial disease, and mixed hyperlipidemia.  He presents today for follow-up evaluation.  The patient denies any new cardiac-related complaints.  He specifically denies chest pain, chest pressure, or heart palpitations.  He is had no lightheadedness, syncope, orthopnea, or PND.  He underwent endovascular intervention of left leg infrainguinal disease in January of this year.  He did well initially but in the last few months his exertional leg pain has come back.  Said he states it is not nearly as severe as it was before his intervention.  He complains of left calf aching with ambulation at a moderate distance.  No rest pain.  Past Medical History:  Diagnosis Date   Allergy    per pt   CAD, NATIVE VESSEL 04/19/2010   nonobstructive by cath 2007:  oLAD 20-30%, mLAD 50%, pCFX 20-30%, oAVCFX 20-30%, L renal art 50%;  normal LVF   Cataract    bil cataracts removed   COPD (chronic obstructive pulmonary disease)    Diabetes mellitus without complication    GERD 04/09/2007   Headache(784.0)    HYPERLIPIDEMIA 04/09/2007   Patient denies.   HYPERTENSION, BENIGN 04/19/2010   Hypothyroidism    Hypothyroidism    previous hyperthyroidism, s/p I-131   LUMBAR DISC DISORDER 05/27/2010   Nerve damage    Left leg   OSTEOARTHRITIS, LUMBAR SPINE 04/09/2007   RENAL CYST 05/27/2010   Spinal headache    After having back surgery in 2014    Past Surgical History:  Procedure Laterality Date   ABDOMINAL  AORTOGRAM W/LOWER EXTREMITY N/A 07/03/2022   Procedure: ABDOMINAL AORTOGRAM W/LOWER EXTREMITY;  Surgeon: Maeola Harman, MD;  Location: [facility] INVASIVE CV LAB;  Service: Cardiovascular;  Laterality: N/A;   BACK SURGERY  2012,2014   CARDIAC CATHETERIZATION  2007   Dr Everardo All   CHOLECYSTECTOMY     CHOLECYSTECTOMY N/A 07/13/2013   Procedure: LAPAROSCOPIC CHOLECYSTECTOMY WITH INTRAOPERATIVE CHOLANGIOGRAM;  Surgeon: Kandis Cocking, MD;  Location: [facility] ORS;  Service: General;  Laterality: N/A;   COLONOSCOPY  2018   COLONOSCOPY W/ POLYPECTOMY     ERCP N/A 07/14/2013   Procedure: ENDOSCOPIC RETROGRADE CHOLANGIOPANCREATOGRAPHY (ERCP);  Surgeon: Rachael Fee, MD;  Location: [facility];  Service: Endoscopy;  Laterality: N/A;   EYE SURGERY Bilateral    Cataract removal   FOOT FRACTURE SURGERY     FRACTURE SURGERY     LUMBAR DISC SURGERY  08/06/2012   L3 & L4   LUMBAR DISC SURGERY  11/11/2015   L1 L2    DR NITKA   LUMBAR FUSION N/A 04/20/2015   Procedure: T12 to L1 fusion (Extension of Previous Fusion L2-S1 to T12-S1), Right Transforaminal lumbar interbody fusion, Posterior Fusion T12 to L1, with Pedicle screws, allograft, local bone graft, and  Vivigen;  Surgeon: Kerrin Champagne, MD;  Location: [facility] OR;  Service: Orthopedics;  Laterality: N/A;   LUMBAR LAMINECTOMY N/A 11/10/2013   Procedure: Left L1-2 far  lateral approach to excise herniated nucleus pulposus;  Surgeon: Kerrin Champagne, MD;  Location: [facility] OR;  Service: Orthopedics;  Laterality: N/A;   LUMBAR LAMINECTOMY/DECOMPRESSION MICRODISCECTOMY Left 08/10/2012   Procedure: Dura Repair Left Side L2-L3;  Surgeon: Kerrin Champagne, MD;  Location: [facility] OR;  Service: Orthopedics;  Laterality: Left;  Wilson Frame, Sliding table, dura repair kit, microscope   NECK SURGERY     X 2   PERIPHERAL VASCULAR INTERVENTION Left 07/03/2022   Procedure: PERIPHERAL VASCULAR INTERVENTION;  Surgeon: Maeola Harman, MD;  Location: [facility] INVASIVE CV LAB;  Service:  Cardiovascular;  Laterality: Left;  fem-pop   SINOSCOPY     SPINE SURGERY  2006   C-spine surgery x 2   UPPER GASTROINTESTINAL ENDOSCOPY      Current Medications: Current Meds  Medication Sig   aspirin EC 81 MG tablet Take 81 mg by mouth daily. Swallow whole.   cholecalciferol (VITAMIN D3) 25 MCG (1000 UT) tablet Take 1,000 Units by mouth daily.   clopidogrel (PLAVIX) 75 MG tablet Take 1 tablet (75 mg total) by mouth daily.   eszopiclone (LUNESTA) 2 MG TABS tablet TAKE 1 TABLET BY MOUTH NIGHTLY AT BEDTIME AS NEEDED FOR SLEEP. take immediately BEFORE bedtime   levothyroxine (SYNTHROID) 137 MCG tablet TAKE 1 TABLET BY MOUTH EVERY DAY BEFORE BREAKFAST   metoprolol succinate (TOPROL-XL) 50 MG 24 hr tablet TAKE 1 TABLET BY MOUTH EVERY DAY WITH OR IMMEDIATELY FOLLOWING A MEAL   pantoprazole (PROTONIX) 40 MG tablet TAKE 1 TABLET BY MOUTH 2 TIMES DAILY BEFORE A MEAL   rosuvastatin (CRESTOR) 10 MG tablet Take 1 tablet (10 mg total) by mouth daily.   Semaglutide 7 MG TABS Take 1 tablet (7 mg total) by mouth daily.   [DISCONTINUED] amLODipine-olmesartan (AZOR) 5-40 MG tablet TAKE 1 TABLET BY MOUTH EVERY DAY     Allergies:   Ceftin, Cefuroxime axetil, and Cephalexin   Social History   Socioeconomic History   Marital status: Single    Spouse name: Not on file   Number of children: 1   Years of education: Not on file   Highest education level: Not on file  Occupational History   Occupation: DISTRIBUTION MGR    Employer: [employer]  Tobacco Use   Smoking status: Former    Packs/day: 1.00    Years: 37.00    Additional pack years: 0.00    Total pack years: 37.00    Types: Cigarettes    Quit date: 04/06/2008    Years since quitting: 14.5   Smokeless tobacco: Never   Tobacco comments:    pt has stopped smoking about 9 months now  Vaping Use   Vaping Use: Never used  Substance and Sexual Activity   Alcohol use: No   Drug use: No   Sexual activity: Not Currently  Other  Topics Concern   Not on file  Social History Narrative   Works in Set designer. Retired.   Lives alone, has one child in [place].    Social Determinants of Health   Financial Resource Strain: Low Risk  (09/26/2022)   Overall Financial Resource Strain (CARDIA)    Difficulty of Paying Living Expenses: Not hard at all  Food Insecurity: No Food Insecurity (09/26/2022)   Hunger Vital Sign    Worried About Running Out of Food in the Last Year: Never true    Ran Out of Food in the Last Year: Never true  Transportation Needs: No Transportation Needs (09/26/2022)   PRAPARE - Transportation  Lack of Transportation (Medical): No    Lack of Transportation (Non-Medical): No  Physical Activity: Sufficiently Active (09/26/2022)   Exercise Vital Sign    Days of Exercise per Week: 5 days    Minutes of Exercise per Session: 30 min  Stress: No Stress Concern Present (09/26/2022)   [employer] of Occupational Health - Occupational Stress Questionnaire    Feeling of Stress : Not at all  Social Connections: Socially Isolated (09/26/2022)   Social Connection and Isolation Panel [NHANES]    Frequency of Communication with Friends and Family: More than three times a week    Frequency of Social Gatherings with Friends and Family: More than three times a week    Attends Religious Services: Never    Database administrator or Organizations: No    Attends Engineer, structural: Never    Marital Status: Never married     Family History: [name] family history includes Breast cancer in his mother; Heart attack (age of onset: 79) in his mother; Heart disease in his mother; Hypertension in his brother; Non-Hodgkin's lymphoma in his brother; Prostate cancer in his father; Stomach cancer in his paternal grandmother; Uterine cancer in his mother. There is no history of Thyroid disease, Colon cancer, Esophageal cancer, Rectal cancer, Pancreatic cancer, or Colon polyps.  ROS:   Please see the history  of present illness.    All other systems reviewed and are negative.  EKGs/Labs/Other Studies Reviewed:    The following studies were reviewed today: Cardiac Studies & Procedures     STRESS TESTS  MYOCARDIAL PERFUSION IMAGING 09/30/2021  Narrative   The study is normal. The study is low risk.   No ST deviation was noted.   Left ventricular function is abnormal. Global function is mildly reduced. Nuclear stress EF: 52 %. The left ventricular ejection fraction is mildly decreased (45-54%). End diastolic cavity size is normal.   Prior study available for comparison from 04/11/2018.  Small size, mild intensity inferior/infero apical lesion appears worse at rest then stress consistent with artifact. SDS =2   ECHOCARDIOGRAM  ECHOCARDIOGRAM COMPLETE 10/09/2022  Narrative ECHOCARDIOGRAM REPORT    Patient Name:   Jeff Lopez Date of Exam: 10/09/2022 Medical Rec #:  [id number]         Height:       69.0 in Accession #:    [id number]        Weight:       196.0 lb Date of Birth:  January 27, 1951         BSA:          2.048 m Patient Age:    71 years          BP:           141/89 mmHg Patient Gender: M                 HR:           56 bpm. Exam Location:  [address]  Procedure: 2D Echo, 3D Echo, Cardiac Doppler and Color Doppler  Indications:    R07.9 chest Pain  History:        Patient has prior history of Echocardiogram examinations, most recent 09/20/2015. CAD; Risk Factors:Hypertension, Diabetes and HLD.  Sonographer:    Clearence Ped RCS Referring Phys: [id number] TESSA N CONTE  IMPRESSIONS   1. Left ventricular ejection fraction, by estimation, is 60 to 65%. Left ventricular ejection fraction by 3D volume is 62 %. The left  ventricle has normal function. The left ventricle has no regional wall motion abnormalities. The left ventricular internal cavity size was mildly dilated. Left ventricular diastolic parameters are consistent with Grade I diastolic dysfunction (impaired  relaxation). 2. Right ventricular systolic function is normal. The right ventricular size is normal. There is normal pulmonary artery systolic pressure. The estimated right ventricular systolic pressure is 23.6 mmHg. 3. The mitral valve is normal in structure. No evidence of mitral valve regurgitation. No evidence of mitral stenosis. 4. The aortic valve is normal in structure. Aortic valve regurgitation is mild. No aortic stenosis is present. Aortic regurgitation PHT measures 493 msec. 5. The inferior vena cava is normal in size with greater than 50% respiratory variability, suggesting right atrial pressure of 3 mmHg.  FINDINGS Left Ventricle: Left ventricular ejection fraction, by estimation, is 60 to 65%. Left ventricular ejection fraction by 3D volume is 62 %. The left ventricle has normal function. The left ventricle has no regional wall motion abnormalities. The left ventricular internal cavity size was mildly dilated. There is no left ventricular hypertrophy. Left ventricular diastolic parameters are consistent with Grade I diastolic dysfunction (impaired relaxation).  Right Ventricle: The right ventricular size is normal. No increase in right ventricular wall thickness. Right ventricular systolic function is normal. There is normal pulmonary artery systolic pressure. The tricuspid regurgitant velocity is 2.27 m/s, and with an assumed right atrial pressure of 3 mmHg, the estimated right ventricular systolic pressure is 23.6 mmHg.  Left Atrium: Left atrial size was normal in size.  Right Atrium: Right atrial size was normal in size.  Pericardium: There is no evidence of pericardial effusion.  Mitral Valve: The mitral valve is normal in structure. No evidence of mitral valve regurgitation. No evidence of mitral valve stenosis.  Tricuspid Valve: The tricuspid valve is normal in structure. Tricuspid valve regurgitation is trivial. No evidence of tricuspid stenosis.  Aortic Valve: The aortic  valve is normal in structure. Aortic valve regurgitation is mild. Aortic regurgitation PHT measures 493 msec. No aortic stenosis is present.  Pulmonic Valve: The pulmonic valve was normal in structure. Pulmonic valve regurgitation is not visualized. No evidence of pulmonic stenosis.  Aorta: The aortic root is normal in size and structure.  Venous: The inferior vena cava is normal in size with greater than 50% respiratory variability, suggesting right atrial pressure of 3 mmHg.  IAS/Shunts: No atrial level shunt detected by color flow Doppler.   LEFT VENTRICLE PLAX 2D LVIDd:         5.40 cm         Diastology LVIDs:         3.70 cm         LV e' medial:    5.77 cm/s LV PW:         1.10 cm         LV E/e' medial:  14.9 LV IVS:        0.90 cm         LV e' lateral:   10.80 cm/s LVOT diam:     2.00 cm         LV E/e' lateral: 8.0 LV SV:         82 LV SV Index:   40 LVOT Area:     3.14 cm        3D Volume EF LV 3D EF:    Left ventricul ar ejection fraction by 3D volume is 62 %.  3D Volume EF: 3D EF:  62 % LV EDV:       144 ml LV ESV:       55 ml LV SV:        89 ml  RIGHT VENTRICLE RV Basal diam:  3.60 cm RV S prime:     8.59 cm/s TAPSE (M-mode): 1.8 cm RVSP:           23.6 mmHg  LEFT ATRIUM             Index        RIGHT ATRIUM           Index LA diam:        5.00 cm 2.44 cm/m   RA Pressure: 3.00 mmHg LA Vol (A2C):   52.0 ml 25.39 ml/m  RA Area:     16.50 cm LA Vol (A4C):   37.4 ml 18.26 ml/m  RA Volume:   41.40 ml  20.21 ml/m LA Biplane Vol: 47.6 ml 23.24 ml/m AORTIC VALVE LVOT Vmax:   113.00 cm/s LVOT Vmean:  74.200 cm/s LVOT VTI:    0.260 m AI PHT:      493 msec  AORTA Ao Root diam: 3.50 cm Ao Asc diam:  3.70 cm  MITRAL VALVE               TRICUSPID VALVE MV Area (PHT):             TR Peak grad:   20.6 mmHg MV Decel Time:             TR Vmax:        227.00 cm/s MV E velocity: 85.90 cm/s  Estimated RAP:  3.00 mmHg MV A velocity: 99.00 cm/s   RVSP:        Donato SchultzmmHg MV E/A ratio:  0.87 SHUNTS Systemic VTI:  0.26 m Systemic Diam: 2.00 cm  Mark Skains MD Electronically signed by Donato Schultz MD Signature Date/Time: 10/09/2022/12:39:17 PM    Final              EKG:  EKG is not ordered today.    Recent Labs: 12/07/2021: ALT 24; TSH 3.01 07/04/2022: BUN 26; Creatinine, Ser 1.37; Hemoglobin 14.7; Platelets 163; Potassium 3.6; Sodium 137  Recent Lipid Panel    Component Value Date/Time   CHOL 142 07/04/2022 0223   TRIG 173 (H) 07/04/2022 0223   HDL 34 (L) 07/04/2022 0223   CHOLHDL 4.2 07/04/2022 0223   VLDL 35 07/04/2022 0223   LDLCALC 73 07/04/2022 0223   LDLCALC 75 01/19/2020 0953   LDLDIRECT 99.0 01/26/2021 0848     Risk Assessment/Calculations:           Physical Exam:    VS:  BP (!) 130/90   Pulse 60   Ht  (1.753 m)   Wt 201 lb (91.2 kg)   SpO2 97%   BMI 29.68 kg/m     Wt Readings from Last 3 Encounters:  10/09/22 201 lb (91.2 kg)  09/26/22 196 lb (88.9 kg)  09/19/22 196 lb 9.6 oz (89.2 kg)     GEN:  Well nourished, well developed in no acute distress HEENT: Normal NECK: No JVD; No carotid bruits LYMPHATICS: No lymphadenopathy CARDIAC: RRR, no murmurs, rubs, gallops RESPIRATORY:  Clear to auscultation without rales, wheezing or rhonchi  ABDOMEN: Soft, non-tender, non-distended MUSCULOSKELETAL:  No edema; No deformity  SKIN: Warm and dry NEUROLOGIC:  Alert and oriented x 3 PSYCHIATRIC:  Normal affect   ASSESSMENT:  1. Coronary artery disease involving native coronary artery of native heart without angina pectoris   2. Essential hypertension   3. Mixed hyperlipidemia   4. Atherosclerosis of native artery of left lower extremity with intermittent claudication    PLAN:    In order of problems listed above:  Myoview scan from last year reviewed with no ischemia.  The patient has had nonobstructive coronary artery disease and he has no anginal symptoms with exertion.  Continue  current management. Blood pressure controlled on amlodipine, metoprolol succinate, and olmesartan. Treated with rosuvastatin 10 mg daily.  Last lipids with an LDL of 73. Patient followed closely by vascular surgery.  He had a lower extremity duplex scan postoperatively and has follow-up arranged again in a few months.  I advised him to watch his symptoms and if he has progressive symptoms of claudication or rest pain, to reach out immediately.  Otherwise he will continue his current medical program.           Medication Adjustments/Labs and Tests Ordered: Current medicines are reviewed at length with the patient today.  Concerns regarding medicines are outlined above.  No orders of the defined types were placed in this encounter.  No orders of the defined types were placed in this encounter.   Patient Instructions  Medication Instructions:  Your physician recommends that you continue on your current medications as directed. Please refer to the Current Medication list given to you today.  *If you need a refill on your cardiac medications before your next appointment, please call your pharmacy*   Lab Work: NONE If you have labs (blood work) drawn today and your tests are completely normal, you will receive your results only by: MyChart Message (if you have MyChart) OR A paper copy in the mail If you have any lab test that is abnormal or we need to change your treatment, we will call you to review the results.   Testing/Procedures: NONE   Follow-Up: At [facility], you and your health needs are our priority.  As part of our continuing mission to provide you with exceptional heart care, we have created designated Provider Care Teams.  These Care Teams include your primary Cardiologist (physician) and Advanced Practice Providers (APPs -  Physician Assistants and Nurse Practitioners) who all work together to provide you with the care you need, when you need it.  We  recommend signing up for the patient portal called "MyChart".  Sign up information is provided on this After Visit Summary.  MyChart is used to connect with patients for Virtual Visits (Telemedicine).  Patients are able to view lab/test results, encounter notes, upcoming appointments, etc.  Non-urgent messages can be sent to your provider as well.   To learn more about what you can do with MyChart, go to [email or web address].    Your next appointment:   1 year(s)  Provider:   Tonny Bollman, MD        Signed, Tonny Bollman, MD  10/10/2022 3:55 PM    [facility]

## 2022-11-01 ENCOUNTER — Other Ambulatory Visit: Payer: Self-pay | Admitting: Family Medicine

## 2022-11-02 ENCOUNTER — Other Ambulatory Visit (HOSPITAL_BASED_OUTPATIENT_CLINIC_OR_DEPARTMENT_OTHER): Payer: Self-pay | Admitting: Family Medicine

## 2022-11-02 MED ORDER — ESZOPICLONE 2 MG PO TABS: ORAL_TABLET | ORAL | 0 refills | Status: DC

## 2022-11-02 NOTE — Progress Notes (Signed)
Lunesta refilled. PDMP reviewed, no red flags.

## 2022-11-08 ENCOUNTER — Telehealth: Payer: Self-pay

## 2022-11-08 NOTE — Telephone Encounter (Signed)
Pt c/o worsening LLE claudication that has been going on about 3 weeks. After his AGM in January of this year, he was able to walk 2-3 miles and now is feeling pain before he makes it a half of a mile. He denies rest pain. He has been scheduled with studies to see APP next week.

## 2022-11-10 ENCOUNTER — Other Ambulatory Visit: Payer: Self-pay | Admitting: Family Medicine

## 2022-11-10 DIAGNOSIS — E890 Postprocedural hypothyroidism: Secondary | ICD-10-CM

## 2022-11-15 ENCOUNTER — Ambulatory Visit (HOSPITAL_COMMUNITY)
Admission: RE | Admit: 2022-11-15 | Discharge: 2022-11-15 | Disposition: A | Discharge status: Home / Self Care | Payer: PPO | Source: Ambulatory Visit | Attending: Vascular Surgery | Admitting: Vascular Surgery

## 2022-11-15 ENCOUNTER — Ambulatory Visit (INDEPENDENT_AMBULATORY_CARE_PROVIDER_SITE_OTHER): Payer: PPO | Admitting: Physician Assistant

## 2022-11-15 ENCOUNTER — Ambulatory Visit (INDEPENDENT_AMBULATORY_CARE_PROVIDER_SITE_OTHER)
Admission: RE | Admit: 2022-11-15 | Discharge: 2022-11-15 | Disposition: A | Discharge status: Home / Self Care | Payer: PPO | Source: Ambulatory Visit | Attending: Vascular Surgery | Admitting: Vascular Surgery

## 2022-11-15 VITALS — BP 121/69 | HR 60 | Temp 97.8°F | Wt 199.0 lb

## 2022-11-15 DIAGNOSIS — I739 Peripheral vascular disease, unspecified: Secondary | ICD-10-CM

## 2022-11-15 DIAGNOSIS — I70212 Atherosclerosis of native arteries of extremities with intermittent claudication, left leg: Secondary | ICD-10-CM

## 2022-11-15 LAB — VAS US ABI WITH/WO TBI
Left ABI: 0.88
Right ABI: 1.01

## 2022-11-15 MED ORDER — CLOPIDOGREL BISULFATE 75 MG PO TABS
75.0000 mg | ORAL_TABLET | Freq: Every day | ORAL | 6 refills | Status: DC
Start: 2022-11-15 — End: 2023-04-24

## 2022-11-15 NOTE — Progress Notes (Signed)
Office Note     CC:  follow up Requesting Provider:  [place], Buren Kos, MD  HPI: Jeff Lopez is a 72 y.o. (1951-03-14) male who presents for evaluation of left leg lifestyle limiting claudication over the past 3 weeks.  His symptoms are almost as bad as discomfort experienced prior to stenting of his left SFA by Dr. Randie Heinz on January 8 of this year.  After walking a short distance on flat road he feels intense pain in his left calf.  This forces him to stop and rest before starting to walk again.  He denies any rest pain or tissue loss of the left foot.  He discontinued his Plavix and aspirin 3 months after left SFA stent was placed.  He has not smoked in over 20 years.  His mother had early coronary disease.  He states his blood pressure and cholesterol are well controlled with current regimen.    Past Medical History:  Diagnosis Date   Allergy    per pt   CAD, NATIVE VESSEL 04/19/2010   nonobstructive by cath 2007:  oLAD 20-30%, mLAD 50%, pCFX 20-30%, oAVCFX 20-30%, L renal art 50%;  normal LVF   Cataract    bil cataracts removed   COPD (chronic obstructive pulmonary disease) (HCC)    Diabetes mellitus without complication (HCC)    GERD 04/09/2007   Headache(784.0)    HYPERLIPIDEMIA 04/09/2007   Patient denies.   HYPERTENSION, BENIGN 04/19/2010   Hypothyroidism    Hypothyroidism    previous hyperthyroidism, s/p I-131   LUMBAR DISC DISORDER 05/27/2010   Nerve damage    Left leg   OSTEOARTHRITIS, LUMBAR SPINE 04/09/2007   RENAL CYST 05/27/2010   Spinal headache    After having back surgery in 2014    Past Surgical History:  Procedure Laterality Date   ABDOMINAL AORTOGRAM W/LOWER EXTREMITY N/A 07/03/2022   Procedure: ABDOMINAL AORTOGRAM W/LOWER EXTREMITY;  Surgeon: Maeola Harman, MD;  Location[facility] INVASIVE CV LAB;  Service: Cardiovascular;  Laterality: N/A;   BACK SURGERY  2012,2014   CARDIAC CATHETERIZATION  2007   Dr Everardo All   CHOLECYSTECTOMY      CHOLECYSTECTOMY N/A 07/13/2013   Procedure: LAPAROSCOPIC CHOLECYSTECTOMY WITH INTRAOPERATIVE CHOLANGIOGRAM;  Surgeon: Kandis Cocking, MD;  Location: [facility] ORS;  Service: General;  Laterality: N/A;   COLONOSCOPY  2018   COLONOSCOPY W/ POLYPECTOMY     ERCP N/A 07/14/2013   Procedure: ENDOSCOPIC RETROGRADE CHOLANGIOPANCREATOGRAPHY (ERCP);  Surgeon: Rachael Fee, MD;  Location: [facility];  Service: Endoscopy;  Laterality: N/A;   EYE SURGERY Bilateral    Cataract removal   FOOT FRACTURE SURGERY     FRACTURE SURGERY     LUMBAR DISC SURGERY  08/06/2012   L3 & L4   LUMBAR DISC SURGERY  11/11/2015   L1 L2    DR NITKA   LUMBAR FUSION N/A 04/20/2015   Procedure: T12 to L1 fusion (Extension of Previous Fusion L2-S1 to T12-S1), Right Transforaminal lumbar interbody fusion, Posterior Fusion T12 to L1, with Pedicle screws, allograft, local bone graft, and  Vivigen;  Surgeon: Kerrin Champagne, MD;  Location: [facility] OR;  Service: Orthopedics;  Laterality: N/A;   LUMBAR LAMINECTOMY N/A 11/10/2013   Procedure: Left L1-2 far lateral approach to excise herniated nucleus pulposus;  Surgeon: Kerrin Champagne, MD;  Location: [facility] OR;  Service: Orthopedics;  Laterality: N/A;   LUMBAR LAMINECTOMY/DECOMPRESSION MICRODISCECTOMY Left 08/10/2012   Procedure: Dura Repair Left Side L2-L3;  Surgeon: Kerrin Champagne, MD;  Location: [facility] OR;  Service: Orthopedics;  Laterality: Left;  Wilson Frame, Sliding table, dura repair kit, microscope   NECK SURGERY     X 2   PERIPHERAL VASCULAR INTERVENTION Left 07/03/2022   Procedure: PERIPHERAL VASCULAR INTERVENTION;  Surgeon: Maeola Harman, MD;  Location: [facility] INVASIVE CV LAB;  Service: Cardiovascular;  Laterality: Left;  fem-pop   SINOSCOPY     SPINE SURGERY  2006   C-spine surgery x 2   UPPER GASTROINTESTINAL ENDOSCOPY      Social History   Socioeconomic History   Marital status: Single    Spouse name: Not on file   Number of children: 1   Years of education: Not on file    Highest education level: Not on file  Occupational History   Occupation: DISTRIBUTION MGR    Employer: [employer]  Tobacco Use   Smoking status: Former    Packs/day: 1.00    Years: 37.00    Additional pack years: 0.00    Total pack years: 37.00    Types: Cigarettes    Quit date: 04/06/2008    Years since quitting: 14.6   Smokeless tobacco: Never   Tobacco comments:    pt has stopped smoking about 9 months now  Vaping Use   Vaping Use: Never used  Substance and Sexual Activity   Alcohol use: No   Drug use: No   Sexual activity: Not Currently  Other Topics Concern   Not on file  Social History Narrative   Works in Set designer. Retired.   Lives alone, has one child in [place].    Social Determinants of Health   Financial Resource Strain: Low Risk  (09/26/2022)   Overall Financial Resource Strain (CARDIA)    Difficulty of Paying Living Expenses: Not hard at all  Food Insecurity: No Food Insecurity (09/26/2022)   Hunger Vital Sign    Worried About Running Out of Food in the Last Year: Never true    Ran Out of Food in the Last Year: Never true  Transportation Needs: No Transportation Needs (09/26/2022)   PRAPARE - Administrator, Civil Service (Medical): No    Lack of Transportation (Non-Medical): No  Physical Activity: Sufficiently Active (09/26/2022)   Exercise Vital Sign    Days of Exercise per Week: 5 days    Minutes of Exercise per Session: 30 min  Stress: No Stress Concern Present (09/26/2022)   [employer] of Occupational Health - Occupational Stress Questionnaire    Feeling of Stress : Not at all  Social Connections: Socially Isolated (09/26/2022)   Social Connection and Isolation Panel [NHANES]    Frequency of Communication with Friends and Family: More than three times a week    Frequency of Social Gatherings with Friends and Family: More than three times a week    Attends Religious Services: Never    Database administrator or Organizations:  No    Attends Banker Meetings: Never    Marital Status: Never married  Intimate Partner Violence: Not At Risk (09/26/2022)   Humiliation, Afraid, Rape, and Kick questionnaire    Fear of Current or Ex-Partner: No    Emotionally Abused: No    Physically Abused: No    Sexually Abused: No    Family History  Problem Relation Age of Onset   Breast cancer Mother    Uterine cancer Mother    Heart disease Mother    Heart attack Mother 79   Prostate cancer Father  Hypertension Brother    Non-Hodgkin's lymphoma Brother    Stomach cancer Paternal Grandmother    Thyroid disease Neg Hx    Colon cancer Neg Hx    Esophageal cancer Neg Hx    Rectal cancer Neg Hx    Pancreatic cancer Neg Hx    Colon polyps Neg Hx     Current Outpatient Medications  Medication Sig Dispense Refill   amLODipine-olmesartan (AZOR) 5-40 MG tablet TAKE 1 TABLET BY MOUTH EVERY DAY 90 tablet 0   aspirin EC 81 MG tablet Take 81 mg by mouth daily. Swallow whole.     clopidogrel (PLAVIX) 75 MG tablet Take 1 tablet (75 mg total) by mouth daily. 30 tablet 6   eszopiclone (LUNESTA) 2 MG TABS tablet Take immediately before bedtime 90 tablet 0   levothyroxine (SYNTHROID) 137 MCG tablet TAKE 1 TABLET BY MOUTH EVERY DAY BEFORE BREAKFAST 90 tablet 1   metoprolol succinate (TOPROL-XL) 50 MG 24 hr tablet TAKE 1 TABLET BY MOUTH EVERY DAY WITH OR IMMEDIATELY FOLLOWING A MEAL 90 tablet 3   pantoprazole (PROTONIX) 40 MG tablet TAKE 1 TABLET BY MOUTH 2 TIMES DAILY BEFORE A MEAL 180 tablet 3   rosuvastatin (CRESTOR) 10 MG tablet Take 1 tablet (10 mg total) by mouth daily. 30 tablet 11   Semaglutide 7 MG TABS Take 1 tablet (7 mg total) by mouth daily. 90 tablet 1   No current facility-administered medications for this visit.    Allergies  Allergen Reactions   Ceftin Other (See Comments)    Patient stated it caused "sores in mouth" Thrush   Cefuroxime Axetil Other (See Comments)    sores in mouth Patient stated it  caused "sores in mouth" Thrush    Cephalexin Other (See Comments)    Other     REVIEW OF SYSTEMS:   [X]  denotes positive finding, [ ]  denotes negative finding Cardiac  Comments:  Chest pain or chest pressure:    Shortness of breath upon exertion:    Short of breath when lying flat:    Irregular heart rhythm:        Vascular    Pain in calf, thigh, or hip brought on by ambulation:    Pain in feet at night that wakes you up from your sleep:     Blood clot in your veins:    Leg swelling:         Pulmonary    Oxygen at home:    Productive cough:     Wheezing:         Neurologic    Sudden weakness in arms or legs:     Sudden numbness in arms or legs:     Sudden onset of difficulty speaking or slurred speech:    Temporary loss of vision in one eye:     Problems with dizziness:         Gastrointestinal    Blood in stool:     Vomited blood:         Genitourinary    Burning when urinating:     Blood in urine:        Psychiatric    Major depression:         Hematologic    Bleeding problems:    Problems with blood clotting too easily:        Skin    Rashes or ulcers:        Constitutional    Fever or chills:  PHYSICAL EXAMINATION:  Vitals:   11/15/22 0953  BP: 121/69  Pulse: 60  Temp: 97.8 F (36.6 C)  TempSrc: Temporal  SpO2: 91%  Weight: 199 lb (90.3 kg)    General:  WDWN in NAD; vital signs documented above Gait: Not observed HENT: WNL, normocephalic Pulmonary: normal non-labored breathing , without Rales, rhonchi,  wheezing Cardiac: regular HR Abdomen: soft, NT, no masses Skin: without rashes Vascular Exam/Pulses: Symmetrical 2+ femoral pulses; unable to palpate popliteal pulses; 2+ palpable right DP and PT; absent left pedal pulses however brisk DP and PT by Doppler Extremities: without ischemic changes, without Gangrene , without cellulitis; without open wounds;  Musculoskeletal: no muscle wasting or atrophy  Neurologic: A&O X  3 Psychiatric:  The pt has Normal affect.   Non-Invasive Vascular Imaging:   Left SFA stent patent but with sluggish flow with velocities in the 40s.  He has a proximal SFA lesion with a velocity of 522 cm/s   ABI/TBIToday's ABIToday's TBIPrevious ABIPrevious TBI  +-------+-----------+-----------+------------+------------+  Right 1.01       0.63       1.15        0.81          +-------+-----------+-----------+------------+------------+  Left  0.88       0.60       1.20        0.93          ASSESSMENT/PLAN:: 72 y.o. male here for follow up due to 3-week history of return of lifestyle limiting claudication to the left lower extremity  -Mr. Jasim Melito is a 72 year old male who underwent left SFA stenting by Dr. Randie Heinz in January of this year due to lifestyle limiting claudication.  Over the past 3 weeks the symptoms have returned however are not quite as bad as they were prior to stenting.  Patient states that his symptoms are impacting his day-to-day life.  On exam he does not have any pedal pulses of the left foot however does have brisk Doppler flow in the DP and PT positions.  Duplex demonstrates a significant late elevated velocity in the proximal SFA above the SFA stent.  As a result, there is sluggish flow through the stent likely representing high risk for impending thrombosis.  He will restart his 81 mg aspirin daily.  I have also sent a prescription in to restart Plavix.  He will be scheduled for aortogram with left lower extremity runoff and possible atherectomy/stenting proximal to the previously placed stent in the superficial femoral artery.  This was discussed with detail with the patient and he is agreeable to proceed due to the severity of his symptoms.   Emilie Rutter, PA-C [facility] [phone]  Clinic MD:   Randie Heinz

## 2022-11-17 ENCOUNTER — Other Ambulatory Visit: Payer: Self-pay | Admitting: *Deleted

## 2022-11-17 DIAGNOSIS — I70212 Atherosclerosis of native arteries of extremities with intermittent claudication, left leg: Secondary | ICD-10-CM

## 2022-11-17 DIAGNOSIS — I739 Peripheral vascular disease, unspecified: Secondary | ICD-10-CM

## 2022-11-23 ENCOUNTER — Other Ambulatory Visit (HOSPITAL_BASED_OUTPATIENT_CLINIC_OR_DEPARTMENT_OTHER): Payer: Self-pay | Admitting: Family Medicine

## 2022-11-23 ENCOUNTER — Other Ambulatory Visit (HOSPITAL_BASED_OUTPATIENT_CLINIC_OR_DEPARTMENT_OTHER): Payer: PPO

## 2022-11-23 DIAGNOSIS — E1143 Type 2 diabetes mellitus with diabetic autonomic (poly)neuropathy: Secondary | ICD-10-CM

## 2022-11-23 DIAGNOSIS — E890 Postprocedural hypothyroidism: Secondary | ICD-10-CM

## 2022-11-23 LAB — HEMOGLOBIN A1C
Est. average glucose Bld gHb Est-mCnc: 226 mg/dL
Hgb A1c MFr Bld: 9.5 % — ABNORMAL HIGH (ref 4.8–5.6)

## 2022-11-30 ENCOUNTER — Ambulatory Visit (INDEPENDENT_AMBULATORY_CARE_PROVIDER_SITE_OTHER): Payer: PPO | Admitting: Family Medicine

## 2022-11-30 ENCOUNTER — Encounter (HOSPITAL_BASED_OUTPATIENT_CLINIC_OR_DEPARTMENT_OTHER): Payer: Self-pay | Admitting: Family Medicine

## 2022-11-30 VITALS — BP 132/77 | HR 60 | Ht 69.0 in | Wt 198.2 lb

## 2022-11-30 DIAGNOSIS — E890 Postprocedural hypothyroidism: Secondary | ICD-10-CM | POA: Diagnosis not present

## 2022-11-30 DIAGNOSIS — Z7984 Long term (current) use of oral hypoglycemic drugs: Secondary | ICD-10-CM

## 2022-11-30 DIAGNOSIS — E1143 Type 2 diabetes mellitus with diabetic autonomic (poly)neuropathy: Secondary | ICD-10-CM

## 2022-11-30 MED ORDER — METFORMIN HCL 500 MG PO TABS: 500.0000 mg | ORAL_TABLET | Freq: Two times a day (BID) | ORAL | 1 refills | Status: DC

## 2022-11-30 NOTE — Progress Notes (Signed)
    Procedures performed today:    None.  Independent interpretation of notes and tests performed by another provider:   None.  Brief History, Exam, Impression, and Recommendations:    BP 132/77 (BP Location: Left Arm, Patient Position: Sitting, Cuff Size: Normal)   Pulse 60   Ht 5\' 9"  (1.753 m)   Wt 198 lb 3.2 oz (89.9 kg)   SpO2 98%   BMI 29.27 kg/m   No problem-specific Assessment & Plan notes found for this encounter.  No follow-ups on file.  Spent 34 minutes on this patient encounter, including preparation, chart review, face-to-face counseling with patient and coordination of care, and documentation of encounter   ___________________________________________ Tricia [place], MD, ABFM, [facility]

## 2022-11-30 NOTE — Assessment & Plan Note (Signed)
Patient presents for follow-up today.  Recent hemoglobin A1c shows increase from prior check, now at 9.5%. He indicates that he stopped taking Rybelsus due to GI side effects, mainly with issues with diarrhea.  He has previously taken metformin at lower dose and tolerated without issue.  He indicates that his last PCP had stopped metformin when he was starting with Rybelsus.  Patient was indicates that he has not been exercising as much due to claudication issues, currently working with vascular specialist related to this He also admits that he could be making better dietary choices Long discussion today with patient regarding medication management.  Certainly reasonable to continue holding Rybelsus given notable side effects.  Given that he has tolerated metformin in the past, would be reasonable to restart this medication, instructed on starting at low-dose and gradually titrating over time to improve tolerability.  Discussed potential side effects.  New prescription sent to pharmacy Additionally discussed other medication options to consider as it is likely that further pharmacotherapy would be required to achieve A1c target of less than 7.5%. Patient will additionally focus on lifestyle modifications, primarily with diet given limitations related to exercise at present Plan for follow-up in about 3 months with labs to be completed about 1 week prior, we will also proceed with nephropathy screening at that time as that we will be due

## 2022-11-30 NOTE — Assessment & Plan Note (Signed)
Unfortunately, TSH was not checked with most recent labs.  Patient continues with same dose of levothyroxine, no new symptoms concerning for hypo or hyperthyroidism.  Can continue with current dose of levothyroxine, plan to check TSH just before next appointment

## 2022-12-14 LAB — HM DIABETES EYE EXAM

## 2022-12-18 ENCOUNTER — Encounter (HOSPITAL_COMMUNITY): Payer: Self-pay | Admitting: Vascular Surgery

## 2022-12-18 ENCOUNTER — Other Ambulatory Visit: Payer: Self-pay

## 2022-12-18 ENCOUNTER — Encounter (HOSPITAL_COMMUNITY)
Admission: RE | Disposition: A | Discharge status: Home / Self Care | Payer: Self-pay | Source: Ambulatory Visit | Attending: Vascular Surgery

## 2022-12-18 ENCOUNTER — Observation Stay (HOSPITAL_COMMUNITY)
Admission: RE | Admit: 2022-12-18 | Discharge: 2022-12-19 | Disposition: A | Discharge status: Home / Self Care | Payer: PPO | Source: Ambulatory Visit | Attending: Vascular Surgery | Admitting: Vascular Surgery

## 2022-12-18 DIAGNOSIS — J449 Chronic obstructive pulmonary disease, unspecified: Secondary | ICD-10-CM | POA: Insufficient documentation

## 2022-12-18 DIAGNOSIS — I10 Essential (primary) hypertension: Secondary | ICD-10-CM | POA: Diagnosis not present

## 2022-12-18 DIAGNOSIS — I739 Peripheral vascular disease, unspecified: Principal | ICD-10-CM | POA: Diagnosis present

## 2022-12-18 DIAGNOSIS — I70212 Atherosclerosis of native arteries of extremities with intermittent claudication, left leg: Secondary | ICD-10-CM | POA: Diagnosis present

## 2022-12-18 DIAGNOSIS — E119 Type 2 diabetes mellitus without complications: Secondary | ICD-10-CM | POA: Diagnosis not present

## 2022-12-18 DIAGNOSIS — I2510 Atherosclerotic heart disease of native coronary artery without angina pectoris: Secondary | ICD-10-CM | POA: Insufficient documentation

## 2022-12-18 DIAGNOSIS — Z79899 Other long term (current) drug therapy: Secondary | ICD-10-CM | POA: Insufficient documentation

## 2022-12-18 DIAGNOSIS — Z87891 Personal history of nicotine dependence: Secondary | ICD-10-CM | POA: Diagnosis not present

## 2022-12-18 DIAGNOSIS — E039 Hypothyroidism, unspecified: Secondary | ICD-10-CM | POA: Insufficient documentation

## 2022-12-18 HISTORY — PX: ABDOMINAL AORTOGRAM W/LOWER EXTREMITY: CATH118223

## 2022-12-18 HISTORY — PX: PERIPHERAL VASCULAR INTERVENTION: CATH118257

## 2022-12-18 LAB — POCT I-STAT, CHEM 8
BUN: 25 mg/dL — ABNORMAL HIGH (ref 8–23)
Calcium, Ion: 1.21 mmol/L (ref 1.15–1.40)
Chloride: 107 mmol/L (ref 98–111)
Creatinine, Ser: 1.6 mg/dL — ABNORMAL HIGH (ref 0.61–1.24)
Glucose, Bld: 159 mg/dL — ABNORMAL HIGH (ref 70–99)
HCT: 41 % (ref 39.0–52.0)
Hemoglobin: 13.9 g/dL (ref 13.0–17.0)
Potassium: 3.7 mmol/L (ref 3.5–5.1)
Sodium: 142 mmol/L (ref 135–145)
TCO2: 24 mmol/L (ref 22–32)

## 2022-12-18 LAB — CBC
HCT: 42.7 % (ref 39.0–52.0)
Hemoglobin: 14.5 g/dL (ref 13.0–17.0)
MCH: 32.2 pg (ref 26.0–34.0)
MCHC: 34 g/dL (ref 30.0–36.0)
MCV: 94.9 fL (ref 80.0–100.0)
Platelets: 151 10*3/uL (ref 150–400)
RBC: 4.5 MIL/uL (ref 4.22–5.81)
RDW: 11.9 % (ref 11.5–15.5)
WBC: 5.5 10*3/uL (ref 4.0–10.5)
nRBC: 0 % (ref 0.0–0.2)

## 2022-12-18 LAB — GLUCOSE, CAPILLARY
Glucose-Capillary: 156 mg/dL — ABNORMAL HIGH (ref 70–99)
Glucose-Capillary: 148 mg/dL — ABNORMAL HIGH (ref 70–99)

## 2022-12-18 LAB — CREATININE, SERUM
Creatinine, Ser: 1.21 mg/dL (ref 0.61–1.24)
GFR, Estimated: >60 mL/min (ref >60)

## 2022-12-18 SURGERY — ABDOMINAL AORTOGRAM W/LOWER EXTREMITY
Anesthesia: LOCAL

## 2022-12-18 MED ORDER — MIDAZOLAM HCL 2 MG/2ML IJ SOLN
INTRAMUSCULAR | Status: AC
  Filled 2022-12-18: qty 2

## 2022-12-18 MED ORDER — IODIXANOL 320 MG/ML IV SOLN
INTRAVENOUS | Status: DC | PRN
  Administered 2022-12-18: 70 mL

## 2022-12-18 MED ORDER — LABETALOL HCL 5 MG/ML IV SOLN: 10.0000 mg | INTRAVENOUS | Status: DC | PRN

## 2022-12-18 MED ORDER — SEMAGLUTIDE 7 MG PO TABS: 1.0000 | ORAL_TABLET | Freq: Every day | ORAL | Status: DC

## 2022-12-18 MED ORDER — IRBESARTAN 150 MG PO TABS
300.0000 mg | ORAL_TABLET | Freq: Every day | ORAL | Status: DC
  Administered 2022-12-19: 300 mg via ORAL
  Filled 2022-12-18: qty 2

## 2022-12-18 MED ORDER — LIDOCAINE HCL (PF) 1 % IJ SOLN
INTRAMUSCULAR | Status: DC | PRN
  Administered 2022-12-18: 15 mL

## 2022-12-18 MED ORDER — SODIUM CHLORIDE 0.9 % IV SOLN: 250.0000 mL | INTRAVENOUS | Status: DC | PRN

## 2022-12-18 MED ORDER — HEPARIN SODIUM (PORCINE) 1000 UNIT/ML IJ SOLN
INTRAMUSCULAR | Status: AC
  Filled 2022-12-18: qty 10

## 2022-12-18 MED ORDER — HEPARIN SODIUM (PORCINE) 5000 UNIT/ML IJ SOLN
5000.0000 [IU] | Freq: Three times a day (TID) | INTRAMUSCULAR | Status: DC
  Administered 2022-12-18 – 2022-12-19 (×2): 5000 [IU] via SUBCUTANEOUS
  Filled 2022-12-18 (×2): qty 1

## 2022-12-18 MED ORDER — SODIUM CHLORIDE 0.9% FLUSH: 3.0000 mL | INTRAVENOUS | Status: DC | PRN

## 2022-12-18 MED ORDER — FENTANYL CITRATE (PF) 100 MCG/2ML IJ SOLN
INTRAMUSCULAR | Status: AC
  Filled 2022-12-18: qty 2

## 2022-12-18 MED ORDER — HYDRALAZINE HCL 20 MG/ML IJ SOLN: 5.0000 mg | INTRAMUSCULAR | Status: DC | PRN

## 2022-12-18 MED ORDER — ROSUVASTATIN CALCIUM 5 MG PO TABS: 10.0000 mg | ORAL_TABLET | Freq: Every day | ORAL | Status: DC

## 2022-12-18 MED ORDER — SODIUM CHLORIDE 0.9 % WEIGHT BASED INFUSION: 1.0000 mL/kg/h | INTRAVENOUS | Status: AC

## 2022-12-18 MED ORDER — ACETAMINOPHEN 325 MG PO TABS: 650.0000 mg | ORAL_TABLET | ORAL | Status: DC | PRN

## 2022-12-18 MED ORDER — METOPROLOL SUCCINATE ER 50 MG PO TB24
50.0000 mg | ORAL_TABLET | Freq: Every day | ORAL | Status: DC
  Administered 2022-12-19: 50 mg via ORAL
  Filled 2022-12-18: qty 1

## 2022-12-18 MED ORDER — ASPIRIN 81 MG PO TBEC
81.0000 mg | DELAYED_RELEASE_TABLET | Freq: Every day | ORAL | Status: DC
  Administered 2022-12-19: 81 mg via ORAL
  Filled 2022-12-18: qty 1

## 2022-12-18 MED ORDER — SODIUM CHLORIDE 0.9% FLUSH
3.0000 mL | Freq: Two times a day (BID) | INTRAVENOUS | Status: DC
  Administered 2022-12-18: 3 mL via INTRAVENOUS

## 2022-12-18 MED ORDER — CLOPIDOGREL BISULFATE 75 MG PO TABS
75.0000 mg | ORAL_TABLET | Freq: Every day | ORAL | Status: DC
  Administered 2022-12-19: 75 mg via ORAL
  Filled 2022-12-18: qty 1

## 2022-12-18 MED ORDER — SODIUM CHLORIDE 0.9 % IV SOLN: INTRAVENOUS | Status: DC

## 2022-12-18 MED ORDER — LIDOCAINE HCL (PF) 1 % IJ SOLN
INTRAMUSCULAR | Status: AC
  Filled 2022-12-18: qty 30

## 2022-12-18 MED ORDER — FENTANYL CITRATE (PF) 100 MCG/2ML IJ SOLN
INTRAMUSCULAR | Status: DC | PRN
  Administered 2022-12-18: 50 ug via INTRAVENOUS

## 2022-12-18 MED ORDER — AMLODIPINE BESYLATE 5 MG PO TABS
5.0000 mg | ORAL_TABLET | Freq: Every day | ORAL | Status: DC
  Administered 2022-12-19: 5 mg via ORAL
  Filled 2022-12-18: qty 1

## 2022-12-18 MED ORDER — AMLODIPINE-OLMESARTAN 5-40 MG PO TABS: 1.0000 | ORAL_TABLET | Freq: Every day | ORAL | Status: DC

## 2022-12-18 MED ORDER — MIDAZOLAM HCL 2 MG/2ML IJ SOLN
INTRAMUSCULAR | Status: DC | PRN
  Administered 2022-12-18: 1 mg via INTRAVENOUS

## 2022-12-18 MED ORDER — HEPARIN (PORCINE) IN NACL 1000-0.9 UT/500ML-% IV SOLN
INTRAVENOUS | Status: DC | PRN
  Administered 2022-12-18 (×2): 500 mL

## 2022-12-18 MED ORDER — ONDANSETRON HCL 4 MG/2ML IJ SOLN: 4.0000 mg | Freq: Four times a day (QID) | INTRAMUSCULAR | Status: DC | PRN

## 2022-12-18 MED ORDER — HEPARIN SODIUM (PORCINE) 1000 UNIT/ML IJ SOLN
INTRAMUSCULAR | Status: DC | PRN
  Administered 2022-12-18: 6000 [IU] via INTRAVENOUS

## 2022-12-18 MED ORDER — ZOLPIDEM TARTRATE 5 MG PO TABS
5.0000 mg | ORAL_TABLET | Freq: Every evening | ORAL | Status: DC | PRN
  Administered 2022-12-18: 5 mg via ORAL
  Filled 2022-12-18: qty 1

## 2022-12-18 MED ORDER — PANTOPRAZOLE SODIUM 40 MG PO TBEC
40.0000 mg | DELAYED_RELEASE_TABLET | Freq: Every day | ORAL | Status: DC
  Administered 2022-12-19: 40 mg via ORAL
  Filled 2022-12-18: qty 1

## 2022-12-18 MED ORDER — LEVOTHYROXINE SODIUM 25 MCG PO TABS
137.0000 ug | ORAL_TABLET | Freq: Every day | ORAL | Status: DC
  Administered 2022-12-19: 137 ug via ORAL
  Filled 2022-12-18: qty 1

## 2022-12-18 SURGICAL SUPPLY — 23 items
BALLN MUSTANG 6.0X40 75 (BALLOONS) ×2
BALLN MUSTANG 7.0X20 75 (BALLOONS) ×2
BALLOON MUSTANG 6.0X40 75 (BALLOONS) IMPLANT
BALLOON MUSTANG 7.0X20 75 (BALLOONS) IMPLANT
CATH OMNI FLUSH 5F 65CM (CATHETERS) IMPLANT
CATH QUICKCROSS SUPP .035X90CM (MICROCATHETER) IMPLANT
CLOSURE MYNX CONTROL 6F/7F (Vascular Products) IMPLANT
GLIDEWIRE ADV .035X260CM (WIRE) IMPLANT
KIT ANGIASSIST CO2 SYSTEM (KITS) IMPLANT
KIT ENCORE 26 ADVANTAGE (KITS) IMPLANT
KIT MICROPUNCTURE NIT STIFF (SHEATH) IMPLANT
KIT PV (KITS) ×3 IMPLANT
SHEATH CATAPULT 6F 45 MP (SHEATH) IMPLANT
SHEATH PINNACLE 5F 10CM (SHEATH) IMPLANT
SHEATH PINNACLE 6F 10CM (SHEATH) IMPLANT
SHEATH PROBE COVER 6X72 (BAG) IMPLANT
STENT ELUVIA 6X40X130 (Permanent Stent) IMPLANT
STOPCOCK MORSE 400PSI 3WAY (MISCELLANEOUS) IMPLANT
SYR MEDRAD MARK 7 150ML (SYRINGE) IMPLANT
TRANSDUCER W/STOPCOCK (MISCELLANEOUS) ×3 IMPLANT
TRAY PV CATH (CUSTOM PROCEDURE TRAY) ×3 IMPLANT
TUBING CIL FLEX 10 FLL-RA (TUBING) IMPLANT
WIRE BENTSON .035X145CM (WIRE) IMPLANT

## 2022-12-18 NOTE — Op Note (Signed)
    Patient name: Jeff Lopez MRN: [id number] DOB: Sep 07, 1950 Sex: male  12/18/2022 Pre-operative Diagnosis: Left lower extremity claudication Post-operative diagnosis:  Same Surgeon:  Apolinar Junes C. Randie Heinz, MD Procedure Performed: 1.  Percutaneous access and Mynx device closure right common femoral artery 2.  Aortogram with bilateral lower extremity runoff 3.  Catheter selection left SFA 4.  Left SFA stent with 6 x 40 mm Eluvia 5.  Moderate sedation with fentanyl and Versed for 59 minutes   Indications: 72 year old male with history of claudication left greater than right with a history of SFA stenting of the left now with recurrent claudication.  He is indicated for angiography with possible invention.  Findings: Aorta and iliac segments are free of flow-limiting stenosis.  Right lower extremity has approximately 30% stenosis in multiple areas of the SFA with dominant runoff via the anterior tibial artery peroneal arteries.  On the left side there is a 80% stenosis of the proximal SFA stent with 0% residual stenosis.  Previous stents have no stenosis and runoff of the anterior tibial and posterior tibial arteries.   Procedure:  The patient was identified in the holding area and taken to room 8.  The patient was then placed supine on the table and prepped and draped in the usual sterile fashion.  A time out was called.  Ultrasound was used to evaluate the right common femoral artery which was noted to be patent and compressible.  The area was anesthetized with 1% lidocaine cannulated with micropuncture needle followed by wire and the sheath.  And images saved department record.  Concomitantly we administered fentanyl and Versed for total sedation of 59 minutes and his vital signs were monitored throughout the case.  We placed a Bentson wire followed by a 5 French sheath and Omni catheter to L1 and perform CO2 aortogram crossed the bifurcation and selective the left SFA with catheter performed left  lower extremity angiography with contrast.  We then placed a Glidewire advantage placed a long 6 French sheath patient was fully heparinized.  We then primarily stented the left SFA with 6 mm x 40 mm stent followed by dilatation with 6 x 40 mm balloon and 7 x 20 mm balloon.  Completion demonstrated no residual stenosis.  We then exchanged for a short 6 French sheath and performed right lower extremity angiography with CO2.  We then deployed a minx device.  Patient tolerated procedure without any complication.   Contrast: 70cc   [name] C. Randie Heinz, MD [facility] [place] Office: [phone] Pager: [phone]

## 2022-12-18 NOTE — Discharge Instructions (Signed)
  [facility] [place]  Discharge Instructions  Lower Extremity Angiogram; Angioplasty/Stenting  Please refer to the following instructions for your post-procedure care. Your surgeon or physician assistant will discuss any changes with you.  Activity  Avoid lifting more than 8 pounds (1 gallons of milk) for 5 days after your procedure. You may walk as much as you can tolerate. It's OK to drive after 72 hours.  Bathing/Showering  You may shower the day after your procedure. If you have a bandage, you may remove it at 24- 48 hours. Clean your incision site with mild soap and water. Pat the area dry with a clean towel.  Diet  Resume your pre-procedure diet. There are no special food restrictions following this procedure. All patients with peripheral vascular disease should follow a low fat/low cholesterol diet. In order to heal from your surgery, it is CRITICAL to get adequate nutrition. Your body requires vitamins, minerals, and protein. Vegetables are the best source of vitamins and minerals. Vegetables also provide the perfect balance of protein. Processed food has little nutritional value, so try to avoid this.  Medications  Resume taking all of your medications unless your doctor tells you not to. If your incision is causing pain, you may take over-the-counter pain relievers such as acetaminophen (Tylenol)  Follow Up  Follow up will be arranged at the time of your procedure. You may have an office visit scheduled or may be scheduled for surgery. Ask your surgeon if you have any questions.  Please call us immediately for any of the following conditions: .Severe or worsening pain your legs or feet at rest or with walking. .Increased pain, redness, drainage at your groin puncture site. .Fever of 101 degrees or higher. .If you have any mild or slow bleeding from your puncture site: lie down, apply firm constant pressure over the area with a piece of gauze or a  clean wash cloth for 30 minutes- no peeking!, call 911 right away if you are still bleeding after 30 minutes, or if the bleeding is heavy and unmanageable.  Reduce your risk factors of vascular disease:  . Stop smoking. If you would like help call [facility] at 1-800-QUIT-NOW (1-800-784-8669) or [place] at 336-586-4000. . Manage your cholesterol . Maintain a desired weight . Control your diabetes . Keep your blood pressure down .  If you have any questions, please call the office at [phone] 

## 2022-12-18 NOTE — H&P (Signed)
HPI: Jeff Lopez is a 72 y.o. (Nov 01, 1950) male who presents for evaluation of left leg lifestyle limiting claudication over the past 3 weeks.  His symptoms are almost as bad as discomfort experienced prior to stenting of his left SFA by Dr. Randie Heinz on January 8 of this year.  After walking a short distance on flat road he feels intense pain in his left calf.  This forces him to stop and rest before starting to walk again.  He denies any rest pain or tissue loss of the left foot.  He discontinued his Plavix and aspirin 3 months after left SFA stent was placed.  He has not smoked in over 20 years.  His mother had early coronary disease.  He states his blood pressure and cholesterol are well controlled with current regimen.           Past Medical History:  Diagnosis Date   Allergy      per pt   CAD, NATIVE VESSEL 04/19/2010    nonobstructive by cath 2007:  oLAD 20-30%, mLAD 50%, pCFX 20-30%, oAVCFX 20-30%, L renal art 50%;  normal LVF   Cataract      bil cataracts removed   COPD (chronic obstructive pulmonary disease) (HCC)     Diabetes mellitus without complication (HCC)     GERD 04/09/2007   Headache(784.0)     HYPERLIPIDEMIA 04/09/2007    Patient denies.   HYPERTENSION, BENIGN 04/19/2010   Hypothyroidism     Hypothyroidism      previous hyperthyroidism, s/p I-131   LUMBAR DISC DISORDER 05/27/2010   Nerve damage      Left leg   OSTEOARTHRITIS, LUMBAR SPINE 04/09/2007   RENAL CYST 05/27/2010   Spinal headache      After having back surgery in 2014           Past Surgical History:  Procedure Laterality Date   ABDOMINAL AORTOGRAM W/LOWER EXTREMITY N/A 07/03/2022    Procedure: ABDOMINAL AORTOGRAM W/LOWER EXTREMITY;  Surgeon: Maeola Harman, MD;  Location: [facility] INVASIVE CV LAB;  Service: Cardiovascular;  Laterality: N/A;   BACK SURGERY   2012,2014   CARDIAC CATHETERIZATION   2007    Dr Everardo All   CHOLECYSTECTOMY       CHOLECYSTECTOMY N/A 07/13/2013    Procedure:  LAPAROSCOPIC CHOLECYSTECTOMY WITH INTRAOPERATIVE CHOLANGIOGRAM;  Surgeon: Kandis Cocking, MD;  Location: [facility] ORS;  Service: General;  Laterality: N/A;   COLONOSCOPY   2018   COLONOSCOPY W/ POLYPECTOMY       ERCP N/A 07/14/2013    Procedure: ENDOSCOPIC RETROGRADE CHOLANGIOPANCREATOGRAPHY (ERCP);  Surgeon: Rachael Fee, MD;  Location: [facility];  Service: Endoscopy;  Laterality: N/A;   EYE SURGERY Bilateral      Cataract removal   FOOT FRACTURE SURGERY       FRACTURE SURGERY       LUMBAR DISC SURGERY   08/06/2012    L3 & L4   LUMBAR DISC SURGERY   11/11/2015    L1 L2    DR NITKA   LUMBAR FUSION N/A 04/20/2015    Procedure: T12 to L1 fusion (Extension of Previous Fusion L2-S1 to T12-S1), Right Transforaminal lumbar interbody fusion, Posterior Fusion T12 to L1, with Pedicle screws, allograft, local bone graft, and  Vivigen;  Surgeon: Kerrin Champagne, MD;  Location: [facility] OR;  Service: Orthopedics;  Laterality: N/A;   LUMBAR LAMINECTOMY N/A 11/10/2013    Procedure: Left L1-2 far lateral approach to excise herniated nucleus pulposus;  Surgeon: Kerrin Champagne, MD;  Location: [facility] OR;  Service: Orthopedics;  Laterality: N/A;   LUMBAR LAMINECTOMY/DECOMPRESSION MICRODISCECTOMY Left 08/10/2012    Procedure: Dura Repair Left Side L2-L3;  Surgeon: Kerrin Champagne, MD;  Location: [facility] OR;  Service: Orthopedics;  Laterality: Left;  Wilson Frame, Sliding table, dura repair kit, microscope   NECK SURGERY        X 2   PERIPHERAL VASCULAR INTERVENTION Left 07/03/2022    Procedure: PERIPHERAL VASCULAR INTERVENTION;  Surgeon: Maeola Harman, MD;  Location: [facility] INVASIVE CV LAB;  Service: Cardiovascular;  Laterality: Left;  fem-pop   SINOSCOPY       SPINE SURGERY   2006    C-spine surgery x 2   UPPER GASTROINTESTINAL ENDOSCOPY          Social History         Socioeconomic History   Marital status: Single      Spouse name: Not on file   Number of children: 1   Years of education: Not on file   Highest  education level: Not on file  Occupational History   Occupation: DISTRIBUTION MGR      Employer: [employer]  Tobacco Use   Smoking status: Former      Packs/day: 1.00      Years: 37.00      Additional pack years: 0.00      Total pack years: 37.00      Types: Cigarettes      Quit date: 04/06/2008      Years since quitting: 14.6   Smokeless tobacco: Never   Tobacco comments:      pt has stopped smoking about 9 months now  Vaping Use   Vaping Use: Never used  Substance and Sexual Activity   Alcohol use: No   Drug use: No   Sexual activity: Not Currently  Other Topics Concern   Not on file  Social History Narrative    Works in Set designer. Retired.    Lives alone, has one child in [place].     Social Determinants of Health        Financial Resource Strain: Low Risk  (09/26/2022)    Overall Financial Resource Strain (CARDIA)     Difficulty of Paying Living Expenses: Not hard at all  Food Insecurity: No Food Insecurity (09/26/2022)    Hunger Vital Sign     Worried About Running Out of Food in the Last Year: Never true     Ran Out of Food in the Last Year: Never true  Transportation Needs: No Transportation Needs (09/26/2022)    PRAPARE - Therapist, art (Medical): No     Lack of Transportation (Non-Medical): No  Physical Activity: Sufficiently Active (09/26/2022)    Exercise Vital Sign     Days of Exercise per Week: 5 days     Minutes of Exercise per Session: 30 min  Stress: No Stress Concern Present (09/26/2022)    [employer] of Occupational Health - Occupational Stress Questionnaire     Feeling of Stress : Not at all  Social Connections: Socially Isolated (09/26/2022)    Social Connection and Isolation Panel [NHANES]     Frequency of Communication with Friends and Family: More than three times a week     Frequency of Social Gatherings with Friends and Family: More than three times a week     Attends Religious Services: Never      Database administrator or Organizations:  No     Attends Club or Organization Meetings: Never     Marital Status: Never married  Intimate Partner Violence: Not At Risk (09/26/2022)    Humiliation, Afraid, Rape, and Kick questionnaire     Fear of Current or Ex-Partner: No     Emotionally Abused: No     Physically Abused: No     Sexually Abused: No           Family History  Problem Relation Age of Onset   Breast cancer Mother     Uterine cancer Mother     Heart disease Mother     Heart attack Mother 49   Prostate cancer Father     Hypertension Brother     Non-Hodgkin's lymphoma Brother     Stomach cancer Paternal Grandmother     Thyroid disease Neg Hx     Colon cancer Neg Hx     Esophageal cancer Neg Hx     Rectal cancer Neg Hx     Pancreatic cancer Neg Hx     Colon polyps Neg Hx              Current Outpatient Medications  Medication Sig Dispense Refill   amLODipine-olmesartan (AZOR) 5-40 MG tablet TAKE 1 TABLET BY MOUTH EVERY DAY 90 tablet 0   aspirin EC 81 MG tablet Take 81 mg by mouth daily. Swallow whole.       clopidogrel (PLAVIX) 75 MG tablet Take 1 tablet (75 mg total) by mouth daily. 30 tablet 6   eszopiclone (LUNESTA) 2 MG TABS tablet Take immediately before bedtime 90 tablet 0   levothyroxine (SYNTHROID) 137 MCG tablet TAKE 1 TABLET BY MOUTH EVERY DAY BEFORE BREAKFAST 90 tablet 1   metoprolol succinate (TOPROL-XL) 50 MG 24 hr tablet TAKE 1 TABLET BY MOUTH EVERY DAY WITH OR IMMEDIATELY FOLLOWING A MEAL 90 tablet 3   pantoprazole (PROTONIX) 40 MG tablet TAKE 1 TABLET BY MOUTH 2 TIMES DAILY BEFORE A MEAL 180 tablet 3   rosuvastatin (CRESTOR) 10 MG tablet Take 1 tablet (10 mg total) by mouth daily. 30 tablet 11   Semaglutide 7 MG TABS Take 1 tablet (7 mg total) by mouth daily. 90 tablet 1    No current facility-administered medications for this visit.           Allergies  Allergen Reactions   Ceftin Other (See Comments)      Patient stated it caused "sores in  mouth" Thrush   Cefuroxime Axetil Other (See Comments)      sores in mouth Patient stated it caused "sores in mouth" Thrush     Cephalexin Other (See Comments)      Other        REVIEW OF SYSTEMS:    [X]  denotes positive finding, [ ]  denotes negative finding Cardiac   Comments:  Chest pain or chest pressure:      Shortness of breath upon exertion:      Short of breath when lying flat:      Irregular heart rhythm:             Vascular      Pain in calf, thigh, or hip brought on by ambulation:      Pain in feet at night that wakes you up from your sleep:       Blood clot in your veins:      Leg swelling:              Pulmonary  Oxygen at home:      Productive cough:       Wheezing:              Neurologic      Sudden weakness in arms or legs:       Sudden numbness in arms or legs:       Sudden onset of difficulty speaking or slurred speech:      Temporary loss of vision in one eye:       Problems with dizziness:              Gastrointestinal      Blood in stool:       Vomited blood:              Genitourinary      Burning when urinating:       Blood in urine:             Psychiatric      Major depression:              Hematologic      Bleeding problems:      Problems with blood clotting too easily:             Skin      Rashes or ulcers:             Constitutional      Fever or chills:          PHYSICAL EXAMINATION:   Vitals:   12/18/22 0524  BP: (!) 152/87  Pulse: 65  Resp: 16  Temp: 98.1 F (36.7 C)  SpO2: 95%      General:  WDWN in NAD; vital signs documented above Gait: Not observed HENT: WNL, normocephalic Pulmonary: normal non-labored breathing , without Rales, rhonchi,  wheezing Cardiac: regular HR Abdomen: soft, NT, no masses Skin: without rashes Vascular Exam/Pulses: Symmetrical 2+ femoral pulses; unable to palpate popliteal pulses; 2+ palpable right DP and PT; absent left pedal pulses however brisk DP and PT by  Doppler Extremities: without ischemic changes, without Gangrene , without cellulitis; without open wounds;  Musculoskeletal: no muscle wasting or atrophy       Neurologic: A&O X 3 Psychiatric:  The pt has Normal affect.     Non-Invasive Vascular Imaging:   Left SFA stent patent but with sluggish flow with velocities in the 40s.  He has a proximal SFA lesion with a velocity of 522 cm/s     ABI/TBIToday's ABIToday's TBIPrevious ABIPrevious TBI  +-------+-----------+-----------+------------+------------+  Right 1.01       0.63       1.15        0.81          +-------+-----------+-----------+------------+------------+  Left  0.88       0.60       1.20        0.93            ASSESSMENT/PLAN:: 72 y.o. male here for follow up due to 3-week history of return of lifestyle limiting claudication to the left lower extremity. Symptoms better than previous. Plan for repeat angiogram focused LLE today in cath lab.  Fathima Bartl C. Randie Heinz, MD [facility] [place] Office: [phone] Pager: [phone]

## 2022-12-19 ENCOUNTER — Encounter (HOSPITAL_COMMUNITY): Payer: Self-pay | Admitting: Vascular Surgery

## 2022-12-19 DIAGNOSIS — I70212 Atherosclerosis of native arteries of extremities with intermittent claudication, left leg: Secondary | ICD-10-CM | POA: Diagnosis not present

## 2022-12-19 LAB — CBC
HCT: 40.7 % (ref 39.0–52.0)
Hemoglobin: 14.1 g/dL (ref 13.0–17.0)
MCH: 33.7 pg (ref 26.0–34.0)
MCHC: 34.6 g/dL (ref 30.0–36.0)
MCV: 97.4 fL (ref 80.0–100.0)
Platelets: 139 10*3/uL — ABNORMAL LOW (ref 150–400)
RBC: 4.18 MIL/uL — ABNORMAL LOW (ref 4.22–5.81)
RDW: 11.9 % (ref 11.5–15.5)
WBC: 6.1 10*3/uL (ref 4.0–10.5)
nRBC: 0 % (ref 0.0–0.2)

## 2022-12-19 LAB — BASIC METABOLIC PANEL
Anion gap: 9 (ref 5–15)
BUN: 21 mg/dL (ref 8–23)
CO2: 22 mmol/L (ref 22–32)
Calcium: 8.3 mg/dL — ABNORMAL LOW (ref 8.9–10.3)
Chloride: 107 mmol/L (ref 98–111)
Creatinine, Ser: 1.57 mg/dL — ABNORMAL HIGH (ref 0.61–1.24)
GFR, Estimated: 47 mL/min — ABNORMAL LOW (ref >60)
Glucose, Bld: 145 mg/dL — ABNORMAL HIGH (ref 70–99)
Potassium: 4 mmol/L (ref 3.5–5.1)
Sodium: 138 mmol/L (ref 135–145)

## 2022-12-19 LAB — LIPID PANEL
Cholesterol: 86 mg/dL (ref 0–200)
HDL: 33 mg/dL — ABNORMAL LOW (ref >40)
LDL Cholesterol: 26 mg/dL (ref 0–99)
Total CHOL/HDL Ratio: 2.6 RATIO
Triglycerides: 137 mg/dL (ref <150)
VLDL: 27 mg/dL (ref 0–40)

## 2022-12-19 MED ORDER — ROSUVASTATIN CALCIUM 20 MG PO TABS
20.0000 mg | ORAL_TABLET | Freq: Every day | ORAL | Status: DC
  Administered 2022-12-19: 20 mg via ORAL
  Filled 2022-12-19: qty 1

## 2022-12-19 MED ORDER — ROSUVASTATIN CALCIUM 20 MG PO TABS: 20.0000 mg | ORAL_TABLET | Freq: Every day | ORAL | 1 refills | Status: DC

## 2022-12-19 NOTE — Discharge Summary (Signed)
  Discharge Summary  Patient ID: Jeff Lopez [id number] 72 y.o. 1951-04-10  Admit date: 12/18/2022  Discharge date and time: 12/19/2022  9:09 AM   Admitting Physician: Maeola Harman, MD   Discharge Physician: same  Admission Diagnoses: PAD (peripheral artery disease) [facility]) [I73.9]  Discharge Diagnoses: same  Admission Condition: fair  Discharged Condition: fair  Indication for Admission: Observation  Hospital Course: [name] is a 72 year old male who underwent aortogram with left SFA stenting by Dr. Randie Heinz on 12/18/2022.  This was performed due to threatened left SFA stent.  he tolerated the procedure well and was kept in observation overnight.  POD #1 right groin access site is without hematoma and has a strong femoral pulse.  He now has a palpable left DP pulse.  He will continue his aspirin, Plavix, statin daily.  He will follow-up in office in 4 to 6 weeks with a left lower extremity arterial duplex and ABI.  He was discharged home in stable condition.  Consults: None  Treatments: surgery: Aortogram with left SFA stenting by Dr. Randie Heinz on 12/18/2022  Discharge Exam: See progress note 6/25 Vitals:   12/19/22 0412 12/19/22 0809  BP: 126/69 128/77  Pulse: 70 69  Resp: 16 18  Temp: 98.1 F (36.7 C) 97.9 F (36.6 C)  SpO2: 97% 97%     Disposition: Discharge disposition: 01-Home or Self Care       Patient Instructions:  Allergies as of 12/19/2022       Reactions   Ceftin Other (See Comments)   Patient stated it caused "sores in mouth" Thrush   Cefuroxime Axetil Other (See Comments)   sores in mouth Patient stated it caused "sores in mouth" Thrush   Cephalexin Other (See Comments)   Other        Medication List     TAKE these medications    amLODipine-olmesartan 5-40 MG tablet Commonly known as: AZOR TAKE 1 TABLET BY MOUTH EVERY DAY   aspirin EC 81 MG tablet Take 81 mg by mouth daily. Swallow whole.   clopidogrel 75 MG  tablet Commonly known as: PLAVIX Take 1 tablet (75 mg total) by mouth daily.   eszopiclone 2 MG Tabs tablet Commonly known as: LUNESTA Take immediately before bedtime What changed:  how much to take how to take this when to take this   levothyroxine 137 MCG tablet Commonly known as: SYNTHROID TAKE 1 TABLET BY MOUTH EVERY DAY BEFORE BREAKFAST What changed: See the new instructions.   metFORMIN 500 MG tablet Commonly known as: GLUCOPHAGE Take 1 tablet (500 mg total) by mouth 2 (two) times daily with a meal.   metoprolol succinate 50 MG 24 hr tablet Commonly known as: TOPROL-XL TAKE 1 TABLET BY MOUTH EVERY DAY WITH OR IMMEDIATELY FOLLOWING A MEAL What changed: See the new instructions.   pantoprazole 40 MG tablet Commonly known as: PROTONIX TAKE 1 TABLET BY MOUTH 2 TIMES DAILY BEFORE A MEAL What changed: See the new instructions.   rosuvastatin 20 MG tablet Commonly known as: CRESTOR Take 1 tablet (20 mg total) by mouth daily. What changed:  medication strength how much to take   Semaglutide 7 MG Tabs Take 1 tablet (7 mg total) by mouth daily.       Activity: activity as tolerated Diet: regular diet Wound Care: none needed  Follow-up with [facility] in 5 weeks.  Signed: Emilie Rutter, PA-C 12/19/2022 9:53 AM [facility] Office: [phone]

## 2022-12-19 NOTE — Progress Notes (Addendum)
  Progress Note    12/19/2022 7:35 AM 1 Day Post-Op  Subjective:  no complaints   Vitals:   12/19/22 0000 12/19/22 0412  BP: 119/75 126/69  Pulse: 62 70  Resp: 17 16  Temp: 98.4 F (36.9 C) 98.1 F (36.7 C)  SpO2: 96% 97%   Physical Exam: Lungs:  non labored Incisions:  R groin cath site without hematoma Extremities:  palpable L DP pulse Neurologic: A&O  CBC    Component Value Date/Time   WBC 6.1 12/19/2022 0148   RBC 4.18 (L) 12/19/2022 0148   HGB 14.1 12/19/2022 0148   HGB 13.8 04/30/2014 1118   HCT 40.7 12/19/2022 0148   HCT 42.0 04/30/2014 1118   PLT 139 (L) 12/19/2022 0148   PLT 215 04/30/2014 1118   MCV 97.4 12/19/2022 0148   MCV 98.1 (H) 04/30/2014 1118   MCH 33.7 12/19/2022 0148   MCHC 34.6 12/19/2022 0148   RDW 11.9 12/19/2022 0148   RDW 13.1 04/30/2014 1118   LYMPHSABS 1.0 12/07/2021 1151   LYMPHSABS 1.3 04/30/2014 1118   MONOABS 0.5 12/07/2021 1151   MONOABS 0.7 04/30/2014 1118   EOSABS 0.1 12/07/2021 1151   EOSABS 0.1 04/30/2014 1118   BASOSABS 0.0 12/07/2021 1151   BASOSABS 0.1 04/30/2014 1118    BMET    Component Value Date/Time   NA 138 12/19/2022 0148   NA 140 07/05/2017 1024   K 4.0 12/19/2022 0148   CL 107 12/19/2022 0148   CO2 22 12/19/2022 0148   GLUCOSE 145 (H) 12/19/2022 0148   BUN 21 12/19/2022 0148   BUN 19 07/05/2017 1024   CREATININE 1.57 (H) 12/19/2022 0148   CREATININE 1.17 04/23/2020 0914   CALCIUM 8.3 (L) 12/19/2022 0148   GFRNONAA 47 (L) 12/19/2022 0148   GFRAA 67 07/05/2017 1024    INR    Component Value Date/Time   INR 0.86 01/25/2011 1421     Intake/Output Summary (Last 24 hours) at 12/19/2022 0735 Last data filed at 12/19/2022 0400 Gross per 24 hour  Intake 1969.95 ml  Output 1150 ml  Net 819.95 ml     Assessment/Plan:  72 y.o. male is s/p L SFA stent 1 Day Post-Op   L foot well perfused with palpable L DP pulse R groin cath site without hematoma Ok for discharge home; continue aspirin, plavix,  statin.  Office will arrange 4-6 week LLE arterial duplex and ABI   Emilie Rutter, PA-C [facility] [phone] 12/19/2022 7:35 AM   I have independently interviewed and examined patient and agree with PA assessment and plan above. Ok for [employer] today. Will need to continue plavix indefinitely.  Tenley Winward C. Randie Heinz, MD [facility] [place] Office: [phone] Pager: [phone]

## 2022-12-19 NOTE — Plan of Care (Signed)
  Problem: Education: Goal: Knowledge of General Education information will improve Description Including pain rating scale, medication(s)/side effects and non-pharmacologic comfort measures Outcome: Progressing   Problem: Health Behavior/Discharge Planning: Goal: Ability to manage health-related needs will improve Outcome: Progressing   

## 2022-12-19 NOTE — Progress Notes (Signed)
PHARMACIST LIPID MONITORING   Jeff Lopez is a 72 y.o. male admitted on 12/18/2022 with PVD.  Pharmacy has been consulted to optimize lipid-lowering therapy with the indication of secondary prevention for clinical ASCVD.  Recent Labs:  Lipid Panel (last 6 months):   Lab Results  Component Value Date   CHOL 86 12/19/2022   TRIG 137 12/19/2022   HDL 33 (L) 12/19/2022   CHOLHDL 2.6 12/19/2022   VLDL 27 12/19/2022   LDLCALC 26 12/19/2022    Hepatic function panel (last 6 months):   No results found for: "AST", "ALT", "ALKPHOS", "BILITOT", "BILIDIR", "IBILI"  SCr (since admission):   Serum creatinine: 1.57 mg/dL (H) 95/62/13 0865 Estimated creatinine clearance: 50.5 mL/min (A)  Current therapy and lipid therapy tolerance Current lipid-lowering therapy: rosuvastatin 10mg  Previous lipid-lowering therapies (if applicable): none Documented or reported allergies or intolerances to lipid-lowering therapies (if applicable): none  Assessment:   Patient agrees with changes to lipid-lowering therapy  Plan:    1.Statin intensity (high intensity recommended for all patients regardless of the LDL):  Add or increase statin to high intensity.  2.Add ezetimibe (if any one of the following):   Not indicated at this time.  3.Refer to lipid clinic:   No  4.Follow-up with:  Primary care provider - de [place], Buren Kos, MD  5.Follow-up labs after discharge:  Changes in lipid therapy were made. Check a lipid panel in 8-12 weeks then annually.       Mosetta Anis, PharmD 12/19/2022, 7:17 AM

## 2022-12-20 ENCOUNTER — Telehealth (HOSPITAL_BASED_OUTPATIENT_CLINIC_OR_DEPARTMENT_OTHER): Payer: Self-pay

## 2022-12-20 NOTE — Transitions of Care (Post Inpatient/ED Visit) (Signed)
12/20/2022  Name: Jeff Lopez MRN: [id number] DOB: 23-Jan-1951  Today's TOC FU Call Status: Today's TOC FU Call Status:: Successful TOC FU Call Competed TOC FU Call Complete Date: 12/20/22  Transition Care Management Follow-up Telephone Call Date of Discharge: 12/19/22 Discharge Facility: [facility]) Type of Discharge: Inpatient Admission Primary Inpatient Discharge Diagnosis:: PAD (peripheral artery disease) How have you been since you were released from the hospital?: Better Any questions or concerns?: No  Items Reviewed: Did you receive and understand the discharge instructions provided?: Yes Medications obtained,verified, and reconciled?: Yes (Medications Reviewed) Any new allergies since your discharge?: No Dietary orders reviewed?: Yes Do you have support at home?: Yes  Medications Reviewed Today: Medications Reviewed Today     Reviewed by Merleen Nicely, LPN (Licensed Practical Nurse) on 12/20/22 at 1149  Med List Status: <None>   Medication Order Taking? Sig Documenting Provider Last Dose Status Informant  amLODipine-olmesartan (AZOR) 5-40 MG tablet [id number] Yes TAKE 1 TABLET BY MOUTH EVERY DAY Novella Olive, FNP Taking Active Self  aspirin EC 81 MG tablet [id number] Yes Take 81 mg by mouth daily. Swallow whole. [provider] Taking Active Self  clopidogrel (PLAVIX) 75 MG tablet [id number] Yes Take 1 tablet (75 mg total) by mouth daily. Emilie Rutter, PA-C Taking Active Self  eszopiclone (LUNESTA) 2 MG TABS tablet [id number] Yes Take immediately before bedtime  Patient taking differently: Take 2 mg by mouth at bedtime. Take immediately before bedtime   Alyson Reedy, FNP Taking Active Self  levothyroxine (SYNTHROID) 137 MCG tablet [id number] Yes TAKE 1 TABLET BY MOUTH EVERY DAY BEFORE BREAKFAST  Patient taking differently: Take 137 mcg by mouth daily before breakfast.   de [place], Raymond J, MD Taking Active Self  metFORMIN (GLUCOPHAGE) 500 MG  tablet [id number] Yes Take 1 tablet (500 mg total) by mouth 2 (two) times daily with a meal. de [place], Buren Kos, MD Taking Active Self  metoprolol succinate (TOPROL-XL) 50 MG 24 hr tablet [id number] Yes TAKE 1 TABLET BY MOUTH EVERY DAY WITH OR IMMEDIATELY FOLLOWING A MEAL  Patient taking differently: Take 50 mg by mouth daily.   Tonny Bollman, MD Taking Active Self  pantoprazole (PROTONIX) 40 MG tablet [id number] Yes TAKE 1 TABLET BY MOUTH 2 TIMES DAILY BEFORE A MEAL  Patient taking differently: Take 40 mg by mouth 2 (two) times daily.   Hilarie Fredrickson, MD Taking Active Self  rosuvastatin (CRESTOR) 20 MG tablet [id number] Yes Take 1 tablet (20 mg total) by mouth daily. Emilie Rutter, PA-C Taking Active   Semaglutide 7 MG TABS [id number] Yes Take 1 tablet (7 mg total) by mouth daily. de [place], Raymond J, MD Taking Active Self            Home Care and Equipment/Supplies: Were Home Health Services Ordered?: No Any new equipment or medical supplies ordered?: No  Functional Questionnaire: Do you need assistance with bathing/showering or dressing?: No Do you need assistance with meal preparation?: No Do you need assistance with eating?: No Do you have difficulty maintaining continence: No Do you need assistance with getting out of bed/getting out of a chair/moving?: No Do you have difficulty managing or taking your medications?: No  Follow up appointments reviewed: PCP Follow-up appointment confirmed?: No MD Provider Line [phone] Given: Yes Specialist Hospital Follow-up appointment confirmed?: No Reason Specialist Follow-Up Not Confirmed: Patient has Specialist Provider Number and will Call for Appointment Do you need transportation to your follow-up appointment?: No Do you understand care options  if your condition(s) worsen?: Yes-patient verbalized understanding    SIGNATURE  Woodfin Ganja LPN [facility] Nurse Health Advisor Direct Dial [phone]

## 2022-12-26 ENCOUNTER — Encounter (HOSPITAL_BASED_OUTPATIENT_CLINIC_OR_DEPARTMENT_OTHER): Payer: Self-pay | Admitting: *Deleted

## 2023-01-02 ENCOUNTER — Other Ambulatory Visit: Payer: Self-pay | Admitting: Family Medicine

## 2023-01-02 DIAGNOSIS — I10 Essential (primary) hypertension: Secondary | ICD-10-CM

## 2023-01-04 ENCOUNTER — Other Ambulatory Visit: Payer: Self-pay | Admitting: *Deleted

## 2023-01-04 DIAGNOSIS — I739 Peripheral vascular disease, unspecified: Secondary | ICD-10-CM

## 2023-01-04 DIAGNOSIS — I70212 Atherosclerosis of native arteries of extremities with intermittent claudication, left leg: Secondary | ICD-10-CM

## 2023-01-10 ENCOUNTER — Encounter (HOSPITAL_BASED_OUTPATIENT_CLINIC_OR_DEPARTMENT_OTHER): Payer: Self-pay | Admitting: Family Medicine

## 2023-01-19 ENCOUNTER — Other Ambulatory Visit (HOSPITAL_BASED_OUTPATIENT_CLINIC_OR_DEPARTMENT_OTHER): Payer: Self-pay | Admitting: Family Medicine

## 2023-01-22 ENCOUNTER — Other Ambulatory Visit (HOSPITAL_BASED_OUTPATIENT_CLINIC_OR_DEPARTMENT_OTHER): Payer: Self-pay | Admitting: Family Medicine

## 2023-01-22 NOTE — Telephone Encounter (Signed)
Please advise on refill request

## 2023-01-23 MED ORDER — ESZOPICLONE 2 MG PO TABS: ORAL_TABLET | ORAL | 0 refills | Status: DC

## 2023-01-23 NOTE — Addendum Note (Signed)
Addended by: DE Peru, Bellanie Matthew J on: 01/23/2023 07:25 PM   Modules accepted: Orders

## 2023-01-24 ENCOUNTER — Ambulatory Visit (INDEPENDENT_AMBULATORY_CARE_PROVIDER_SITE_OTHER)
Admission: RE | Admit: 2023-01-24 | Discharge: 2023-01-24 | Disposition: A | Discharge status: Home / Self Care | Payer: PPO | Source: Ambulatory Visit | Attending: Vascular Surgery | Admitting: Vascular Surgery

## 2023-01-24 ENCOUNTER — Ambulatory Visit (INDEPENDENT_AMBULATORY_CARE_PROVIDER_SITE_OTHER): Payer: PPO | Admitting: Physician Assistant

## 2023-01-24 ENCOUNTER — Ambulatory Visit (HOSPITAL_COMMUNITY)
Admission: RE | Admit: 2023-01-24 | Discharge: 2023-01-24 | Disposition: A | Discharge status: Home / Self Care | Payer: PPO | Source: Ambulatory Visit | Attending: Vascular Surgery | Admitting: Vascular Surgery

## 2023-01-24 VITALS — BP 135/82 | HR 58 | Temp 98.0°F | Resp 20 | Ht 71.0 in | Wt 199.0 lb

## 2023-01-24 DIAGNOSIS — I739 Peripheral vascular disease, unspecified: Secondary | ICD-10-CM

## 2023-01-24 DIAGNOSIS — I70212 Atherosclerosis of native arteries of extremities with intermittent claudication, left leg: Secondary | ICD-10-CM | POA: Insufficient documentation

## 2023-01-24 LAB — VAS US ABI WITH/WO TBI
Left ABI: 1.21
Right ABI: 1.04

## 2023-01-24 NOTE — Progress Notes (Signed)
HISTORY AND PHYSICAL     CC:  follow up. Requesting Provider:  de [place], Buren Kos, MD  HPI: This is a 72 y.o. male who is here today for follow up for PAD.  Pt has hx of aortogram with stent to the left SFA on 07/03/2022 by Dr. Randie Heinz.  He subsequently aortogram with stent to left SFA on 12/18/2022 also by Dr. Randie Heinz and this was done for lifestyle limiting claudication.     The pt returns today for follow up.  He states he has not had any claudication or rest pain or non healing wounds.  He inquires about how long he will need to take Plavix.  He has been compliant with his asa/statin/plavix.  He hopes the stent is open bc he does not want to have bypass surgery.   He states he used to enjoy playing golf, but he has had 4 back surgeries and told he should not play golf anymore.  The pt is on a statin for cholesterol management.    The pt is on an aspirin.    Other AC:  Plavix The pt is on CCB, ARB, BB for hypertension.  The pt is on medication for diabetes. Tobacco hx:  former   Past Medical History:  Diagnosis Date   Allergy    per pt   CAD, NATIVE VESSEL 04/19/2010   nonobstructive by cath 2007:  oLAD 20-30%, mLAD 50%, pCFX 20-30%, oAVCFX 20-30%, L renal art 50%;  normal LVF   Cataract    bil cataracts removed   COPD (chronic obstructive pulmonary disease) (HCC)    Diabetes mellitus without complication (HCC)    GERD 04/09/2007   Headache(784.0)    HYPERLIPIDEMIA 04/09/2007   Patient denies.   HYPERTENSION, BENIGN 04/19/2010   Hypothyroidism    Hypothyroidism    previous hyperthyroidism, s/p I-131   LUMBAR DISC DISORDER 05/27/2010   Nerve damage    Left leg   OSTEOARTHRITIS, LUMBAR SPINE 04/09/2007   RENAL CYST 05/27/2010   Spinal headache    After having back surgery in 2014    Past Surgical History:  Procedure Laterality Date   ABDOMINAL AORTOGRAM W/LOWER EXTREMITY N/A 07/03/2022   Procedure: ABDOMINAL AORTOGRAM W/LOWER EXTREMITY;  Surgeon: Maeola Harman, MD;   Location: [facility] INVASIVE CV LAB;  Service: Cardiovascular;  Laterality: N/A;   ABDOMINAL AORTOGRAM W/LOWER EXTREMITY N/A 12/18/2022   Procedure: ABDOMINAL AORTOGRAM W/LOWER EXTREMITY;  Surgeon: Maeola Harman, MD;  Location: [facility] INVASIVE CV LAB;  Service: Cardiovascular;  Laterality: N/A;   BACK SURGERY  2012,2014   CARDIAC CATHETERIZATION  2007   Dr Everardo All   CHOLECYSTECTOMY     CHOLECYSTECTOMY N/A 07/13/2013   Procedure: LAPAROSCOPIC CHOLECYSTECTOMY WITH INTRAOPERATIVE CHOLANGIOGRAM;  Surgeon: Kandis Cocking, MD;  Location: [facility] ORS;  Service: General;  Laterality: N/A;   COLONOSCOPY  2018   COLONOSCOPY W/ POLYPECTOMY     ERCP N/A 07/14/2013   Procedure: ENDOSCOPIC RETROGRADE CHOLANGIOPANCREATOGRAPHY (ERCP);  Surgeon: Rachael Fee, MD;  Location: [facility];  Service: Endoscopy;  Laterality: N/A;   EYE SURGERY Bilateral    Cataract removal   FOOT FRACTURE SURGERY     FRACTURE SURGERY     LUMBAR DISC SURGERY  08/06/2012   L3 & L4   LUMBAR DISC SURGERY  11/11/2015   L1 L2    DR NITKA   LUMBAR FUSION N/A 04/20/2015   Procedure: T12 to L1 fusion (Extension of Previous Fusion L2-S1 to T12-S1), Right Transforaminal lumbar interbody fusion, Posterior Fusion  T12 to L1, with Pedicle screws, allograft, local bone graft, and  Vivigen;  Surgeon: Kerrin Champagne, MD;  Location: [facility] OR;  Service: Orthopedics;  Laterality: N/A;   LUMBAR LAMINECTOMY N/A 11/10/2013   Procedure: Left L1-2 far lateral approach to excise herniated nucleus pulposus;  Surgeon: Kerrin Champagne, MD;  Location: [facility] OR;  Service: Orthopedics;  Laterality: N/A;   LUMBAR LAMINECTOMY/DECOMPRESSION MICRODISCECTOMY Left 08/10/2012   Procedure: Dura Repair Left Side L2-L3;  Surgeon: Kerrin Champagne, MD;  Location: [facility] OR;  Service: Orthopedics;  Laterality: Left;  Wilson Frame, Sliding table, dura repair kit, microscope   NECK SURGERY     X 2   PERIPHERAL VASCULAR INTERVENTION Left 07/03/2022   Procedure: PERIPHERAL VASCULAR INTERVENTION;   Surgeon: Maeola Harman, MD;  Location: [facility] INVASIVE CV LAB;  Service: Cardiovascular;  Laterality: Left;  fem-pop   PERIPHERAL VASCULAR INTERVENTION  12/18/2022   Procedure: PERIPHERAL VASCULAR INTERVENTION;  Surgeon: Maeola Harman, MD;  Location: [facility] INVASIVE CV LAB;  Service: Cardiovascular;;   SINOSCOPY     SPINE SURGERY  2006   C-spine surgery x 2   UPPER GASTROINTESTINAL ENDOSCOPY      Allergies  Allergen Reactions   Ceftin Other (See Comments)    Patient stated it caused "sores in mouth" Thrush   Cefuroxime Axetil Other (See Comments)    sores in mouth Patient stated it caused "sores in mouth" Thrush    Cephalexin Other (See Comments)    Other    Current Outpatient Medications  Medication Sig Dispense Refill   amLODipine-olmesartan (AZOR) 5-40 MG tablet TAKE 1 TABLET BY MOUTH EVERY DAY 90 tablet 0   aspirin EC 81 MG tablet Take 81 mg by mouth daily. Swallow whole.     clopidogrel (PLAVIX) 75 MG tablet Take 1 tablet (75 mg total) by mouth daily. 30 tablet 6   eszopiclone (LUNESTA) 2 MG TABS tablet Take immediately before bedtime 90 tablet 0   levothyroxine (SYNTHROID) 137 MCG tablet TAKE 1 TABLET BY MOUTH EVERY DAY BEFORE BREAKFAST (Patient taking differently: Take 137 mcg by mouth daily before breakfast.) 90 tablet 1   metFORMIN (GLUCOPHAGE) 500 MG tablet Take 1 tablet (500 mg total) by mouth 2 (two) times daily with a meal. 180 tablet 1   metoprolol succinate (TOPROL-XL) 50 MG 24 hr tablet TAKE 1 TABLET BY MOUTH EVERY DAY WITH OR IMMEDIATELY FOLLOWING A MEAL (Patient taking differently: Take 50 mg by mouth daily.) 90 tablet 3   pantoprazole (PROTONIX) 40 MG tablet TAKE 1 TABLET BY MOUTH 2 TIMES DAILY BEFORE A MEAL (Patient taking differently: Take 40 mg by mouth 2 (two) times daily.) 180 tablet 3   rosuvastatin (CRESTOR) 20 MG tablet Take 1 tablet (20 mg total) by mouth daily. 90 tablet 1   Semaglutide 7 MG TABS Take 1 tablet (7 mg total) by mouth daily.  90 tablet 1   No current facility-administered medications for this visit.    Family History  Problem Relation Age of Onset   Breast cancer Mother    Uterine cancer Mother    Heart disease Mother    Heart attack Mother 28   Prostate cancer Father    Hypertension Brother    Non-Hodgkin's lymphoma Brother    Stomach cancer Paternal Grandmother    Thyroid disease Neg Hx    Colon cancer Neg Hx    Esophageal cancer Neg Hx    Rectal cancer Neg Hx    Pancreatic cancer Neg Hx  Colon polyps Neg Hx     Social History   Socioeconomic History   Marital status: Single    Spouse name: Not on file   Number of children: 1   Years of education: Not on file   Highest education level: Not on file  Occupational History   Occupation: DISTRIBUTION MGR    Employer: [employer]  Tobacco Use   Smoking status: Former    Current packs/day: 0.00    Average packs/day: 1 pack/day for 37.0 years (37.0 ttl pk-yrs)    Types: Cigarettes    Start date: 04/07/1971    Quit date: 04/06/2008    Years since quitting: 14.8   Smokeless tobacco: Never   Tobacco comments:    pt has stopped smoking about 9 months now  Vaping Use   Vaping status: Never Used  Substance and Sexual Activity   Alcohol use: No   Drug use: No   Sexual activity: Not Currently  Other Topics Concern   Not on file  Social History Narrative   Works in Set designer. Retired.   Lives alone, has one child in [place].    Social Determinants of Health   Financial Resource Strain: Low Risk  (09/26/2022)   Overall Financial Resource Strain (CARDIA)    Difficulty of Paying Living Expenses: Not hard at all  Food Insecurity: No Food Insecurity (12/18/2022)   Hunger Vital Sign    Worried About Running Out of Food in the Last Year: Never true    Ran Out of Food in the Last Year: Never true  Transportation Needs: No Transportation Needs (12/18/2022)   PRAPARE - Administrator, Civil Service (Medical): No    Lack  of Transportation (Non-Medical): No  Physical Activity: Sufficiently Active (09/26/2022)   Exercise Vital Sign    Days of Exercise per Week: 5 days    Minutes of Exercise per Session: 30 min  Stress: No Stress Concern Present (09/26/2022)   [employer] of Occupational Health - Occupational Stress Questionnaire    Feeling of Stress : Not at all  Social Connections: Socially Isolated (09/26/2022)   Social Connection and Isolation Panel [NHANES]    Frequency of Communication with Friends and Family: More than three times a week    Frequency of Social Gatherings with Friends and Family: More than three times a week    Attends Religious Services: Never    Database administrator or Organizations: No    Attends Banker Meetings: Never    Marital Status: Never married  Intimate Partner Violence: Not At Risk (12/18/2022)   Humiliation, Afraid, Rape, and Kick questionnaire    Fear of Current or Ex-Partner: No    Emotionally Abused: No    Physically Abused: No    Sexually Abused: No     REVIEW OF SYSTEMS:   [X]  denotes positive finding, [ ]  denotes negative finding Cardiac  Comments:  Chest pain or chest pressure:    Shortness of breath upon exertion:    Short of breath when lying flat:    Irregular heart rhythm:        Vascular    Pain in calf, thigh, or hip brought on by ambulation:    Pain in feet at night that wakes you up from your sleep:     Blood clot in your veins:    Leg swelling:         Pulmonary    Oxygen at home:    Productive cough:  Wheezing:         Neurologic    Sudden weakness in arms or legs:     Sudden numbness in arms or legs:     Sudden onset of difficulty speaking or slurred speech:    Temporary loss of vision in one eye:     Problems with dizziness:         Gastrointestinal    Blood in stool:     Vomited blood:         Genitourinary    Burning when urinating:     Blood in urine:        Psychiatric    Major depression:          Hematologic    Bleeding problems:    Problems with blood clotting too easily:        Skin    Rashes or ulcers:        Constitutional    Fever or chills:      PHYSICAL EXAMINATION:  Today's Vitals   01/24/23 1244  BP: 135/82  Pulse: (!) 58  Resp: 20  Temp: 98 F (36.7 C)  SpO2: 93%  Weight: 199 lb (90.3 kg)  Height: 5\' 11"  (1.803 m)   Body mass index is 27.75 kg/m.   General:  WDWN in NAD; vital signs documented above Gait: Not observed HENT: WNL, normocephalic Pulmonary: normal non-labored breathing , without wheezing Cardiac: regular HR, without carotid bruits Abdomen: soft, NT; aortic pulse is not palpable Skin: without rashes Vascular Exam/Pulses:  Right Left  Radial 2+ (normal) 2+ (normal)  Popliteal Unable to palpate Unable to palpate  DP 2+ (normal) 2+ (normal)  PT 1+ (weak) Unable to palpate   Extremities: without ischemic changes, without Gangrene , without cellulitis; without open wounds Musculoskeletal: no muscle wasting or atrophy  Neurologic: A&O X 3 Psychiatric:  The pt has Normal affect.   Non-Invasive Vascular Imaging:   ABI's/TBI's on 01/24/2023: Right:  1.04/0.70 - Great toe pressure: 97 Left:  1.21/0.88 - Great toe pressure: 121  Arterial duplex on 01/24/2023: Summary:  Left: Patent left stents with no stenosis.   Previous ABI's/TBI's on 11/15/2022: Right:  1.01/0.63 - Great toe pressure: 85 Left:  0.88/0.60 - Great toe pressure:  81    ASSESSMENT/PLAN:: 72 y.o. male here for follow up for PAD with hx of aortogram with stent to the left SFA on 07/03/2022 by Dr. Randie Heinz.  He subsequently aortogram with stent to left SFA on 12/18/2022 also by Dr. Randie Heinz and this was done for lifestyle limiting claudication.      -pt with palpable DP pulses bilaterally.  He has not had anymore claudication.   -duplex today reveals he has a patent SFA stent and ABI/TBI improved -discussed continuing walking program -continue asa/statin/plavix.  He states after  the first stent, he was told to take the Plavix for 3 months.  Discussed since he occluded his stent, he would probably need to stay on this as long as he can tolerate it.  He expressed understanding. -pt will f/u in 6 months with ABI and LLE arterial duplex and see Dr. Randie Heinz or PA.  He will call sooner if any issues before then. -discussed he needs good glucose control, diet and good BP control.    Doreatha Massed, [facility] [phone]  Clinic MD:   Edilia Bo

## 2023-01-26 ENCOUNTER — Other Ambulatory Visit: Payer: Self-pay

## 2023-01-26 DIAGNOSIS — I739 Peripheral vascular disease, unspecified: Secondary | ICD-10-CM

## 2023-02-05 ENCOUNTER — Ambulatory Visit (HOSPITAL_BASED_OUTPATIENT_CLINIC_OR_DEPARTMENT_OTHER)
Admission: RE | Admit: 2023-02-05 | Discharge: 2023-02-05 | Disposition: A | Discharge status: Home / Self Care | Payer: PPO | Source: Ambulatory Visit | Attending: Acute Care | Admitting: Acute Care

## 2023-02-05 ENCOUNTER — Encounter: Payer: Self-pay | Admitting: Family Medicine

## 2023-02-05 DIAGNOSIS — Z122 Encounter for screening for malignant neoplasm of respiratory organs: Secondary | ICD-10-CM | POA: Insufficient documentation

## 2023-02-05 DIAGNOSIS — I700 Atherosclerosis of aorta: Secondary | ICD-10-CM | POA: Diagnosis not present

## 2023-02-05 DIAGNOSIS — D3501 Benign neoplasm of right adrenal gland: Secondary | ICD-10-CM | POA: Diagnosis not present

## 2023-02-05 DIAGNOSIS — Z87891 Personal history of nicotine dependence: Secondary | ICD-10-CM | POA: Diagnosis present

## 2023-02-05 DIAGNOSIS — J439 Emphysema, unspecified: Secondary | ICD-10-CM | POA: Diagnosis not present

## 2023-02-09 ENCOUNTER — Encounter: Payer: Self-pay | Admitting: Acute Care

## 2023-02-09 DIAGNOSIS — Z87891 Personal history of nicotine dependence: Secondary | ICD-10-CM

## 2023-02-09 DIAGNOSIS — Z122 Encounter for screening for malignant neoplasm of respiratory organs: Secondary | ICD-10-CM

## 2023-02-14 ENCOUNTER — Other Ambulatory Visit (HOSPITAL_BASED_OUTPATIENT_CLINIC_OR_DEPARTMENT_OTHER): Payer: Self-pay

## 2023-02-14 ENCOUNTER — Ambulatory Visit: Payer: PPO

## 2023-02-14 ENCOUNTER — Encounter (HOSPITAL_COMMUNITY): Payer: PPO

## 2023-02-14 ENCOUNTER — Other Ambulatory Visit (HOSPITAL_BASED_OUTPATIENT_CLINIC_OR_DEPARTMENT_OTHER): Payer: PPO

## 2023-02-14 DIAGNOSIS — Z0000 Encounter for general adult medical examination without abnormal findings: Secondary | ICD-10-CM

## 2023-02-14 DIAGNOSIS — E890 Postprocedural hypothyroidism: Secondary | ICD-10-CM

## 2023-02-14 DIAGNOSIS — E1143 Type 2 diabetes mellitus with diabetic autonomic (poly)neuropathy: Secondary | ICD-10-CM

## 2023-02-15 LAB — MICROALBUMIN / CREATININE URINE RATIO
Creatinine, Urine: 242.9 mg/dL
Microalb/Creat Ratio: 149 mg/g{creat} — ABNORMAL HIGH (ref 0–29)
Microalbumin, Urine: 362.9 ug/mL

## 2023-02-15 LAB — CBC WITH DIFFERENTIAL/PLATELET
Basophils Absolute: 0 10*3/uL (ref 0.0–0.2)
Basos: 1 %
EOS (ABSOLUTE): 0.2 10*3/uL (ref 0.0–0.4)
Eos: 4 %
Hematocrit: 44.6 % (ref 37.5–51.0)
Hemoglobin: 14.7 g/dL (ref 13.0–17.7)
Immature Grans (Abs): 0 10*3/uL (ref 0.0–0.1)
Immature Granulocytes: 0 %
Lymphocytes Absolute: 1.4 10*3/uL (ref 0.7–3.1)
Lymphs: 25 %
MCH: 31.7 pg (ref 26.6–33.0)
MCHC: 33 g/dL (ref 31.5–35.7)
MCV: 96 fL (ref 79–97)
Monocytes Absolute: 0.5 10*3/uL (ref 0.1–0.9)
Monocytes: 10 %
Neutrophils Absolute: 3.3 10*3/uL (ref 1.4–7.0)
Neutrophils: 60 %
Platelets: 159 10*3/uL (ref 150–450)
RBC: 4.64 x10E6/uL (ref 4.14–5.80)
RDW: 11.8 % (ref 11.6–15.4)
WBC: 5.5 10*3/uL (ref 3.4–10.8)

## 2023-02-15 LAB — LIPID PANEL
Chol/HDL Ratio: 2.7 ratio (ref 0.0–5.0)
Cholesterol, Total: 98 mg/dL — ABNORMAL LOW (ref 100–199)
HDL: 36 mg/dL — ABNORMAL LOW (ref >39)
LDL Chol Calc (NIH): 40 mg/dL (ref 0–99)
Triglycerides: 121 mg/dL (ref 0–149)
VLDL Cholesterol Cal: 22 mg/dL (ref 5–40)

## 2023-02-15 LAB — COMPREHENSIVE METABOLIC PANEL
ALT: 31 IU/L (ref 0–44)
AST: 21 IU/L (ref 0–40)
Albumin: 4.7 g/dL (ref 3.9–4.9)
Alkaline Phosphatase: 92 IU/L (ref 44–121)
BUN/Creatinine Ratio: 15 (ref 10–24)
BUN: 19 mg/dL (ref 8–27)
Bilirubin Total: 1.3 mg/dL — ABNORMAL HIGH (ref 0.0–1.2)
CO2: 20 mmol/L (ref 20–29)
Calcium: 9.2 mg/dL (ref 8.6–10.2)
Chloride: 105 mmol/L (ref 96–106)
Creatinine, Ser: 1.25 mg/dL (ref 0.76–1.27)
Globulin, Total: 2.1 g/dL (ref 1.5–4.5)
Glucose: 180 mg/dL — ABNORMAL HIGH (ref 70–99)
Potassium: 4.1 mmol/L (ref 3.5–5.2)
Sodium: 143 mmol/L (ref 134–144)
Total Protein: 6.8 g/dL (ref 6.0–8.5)
eGFR: 62 mL/min/{1.73_m2} (ref >59)

## 2023-02-15 LAB — TSH: TSH: 1.81 u[IU]/mL (ref 0.450–4.500)

## 2023-02-15 LAB — HEMOGLOBIN A1C
Est. average glucose Bld gHb Est-mCnc: 194 mg/dL
Hgb A1c MFr Bld: 8.4 % — ABNORMAL HIGH (ref 4.8–5.6)

## 2023-02-19 ENCOUNTER — Ambulatory Visit (HOSPITAL_BASED_OUTPATIENT_CLINIC_OR_DEPARTMENT_OTHER): Payer: PPO

## 2023-02-20 ENCOUNTER — Encounter (HOSPITAL_BASED_OUTPATIENT_CLINIC_OR_DEPARTMENT_OTHER): Payer: Self-pay

## 2023-02-20 ENCOUNTER — Encounter (HOSPITAL_BASED_OUTPATIENT_CLINIC_OR_DEPARTMENT_OTHER): Payer: PPO | Admitting: Family Medicine

## 2023-03-02 ENCOUNTER — Ambulatory Visit (HOSPITAL_BASED_OUTPATIENT_CLINIC_OR_DEPARTMENT_OTHER): Payer: PPO | Admitting: Family Medicine

## 2023-03-02 ENCOUNTER — Encounter (HOSPITAL_BASED_OUTPATIENT_CLINIC_OR_DEPARTMENT_OTHER): Payer: Self-pay | Admitting: Family Medicine

## 2023-03-02 VITALS — BP 135/68 | HR 58 | Ht 71.0 in | Wt 202.0 lb

## 2023-03-02 DIAGNOSIS — Z7984 Long term (current) use of oral hypoglycemic drugs: Secondary | ICD-10-CM | POA: Diagnosis not present

## 2023-03-02 DIAGNOSIS — E1143 Type 2 diabetes mellitus with diabetic autonomic (poly)neuropathy: Secondary | ICD-10-CM

## 2023-03-02 DIAGNOSIS — Z0000 Encounter for general adult medical examination without abnormal findings: Secondary | ICD-10-CM

## 2023-03-02 DIAGNOSIS — Z23 Encounter for immunization: Secondary | ICD-10-CM | POA: Diagnosis not present

## 2023-03-02 MED ORDER — METFORMIN HCL ER 500 MG PO TB24
1000.0000 mg | ORAL_TABLET | Freq: Every day | ORAL | 1 refills | Status: DC
Start: 2023-03-02 — End: 2024-01-22

## 2023-03-02 NOTE — Assessment & Plan Note (Signed)
At last visit, patient was restarted on metformin, he has been taking 500 mg twice daily and tolerating well.  He does indicate that he will occasionally miss doses as he forgets to take it from time to time.  Recent hemoglobin A1c did show slight improvement with medication change with A1c at 8.4%.  Does still remain above goal of less than 7.5%. Discussed options today, in order to improve adherence to recommended dose of metformin, we will switch to once daily extended release version.  We also discussed additional treatment considerations given that A1c is not at goal yet.  We discussed alternative medication classes which can be utilized.  Patient declines addition of any other medications currently.  He would prefer to continue with metformin and focusing on lifestyle modifications

## 2023-03-02 NOTE — Assessment & Plan Note (Signed)
Routine HCM labs reviewed. HCM reviewed/discussed. Anticipatory guidance regarding healthy weight, lifestyle and choices given. Recommend healthy diet.  Recommend approximately 150 minutes/week of moderate intensity exercise Recommend regular dental and vision exams Always use seatbelt/lap and shoulder restraints Recommend using smoke alarms and checking batteries at least twice a year Recommend using sunscreen when outside Discussed colon cancer screening recommendations, options.  Patient will consider and let us know how he would like to proceed Discussed tetanus immunization recommendations, patient is UTD Recommend seasonal influenza vaccine, patient amenable, administered today

## 2023-03-02 NOTE — Progress Notes (Signed)
Subjective:    CC: Annual Physical Exam  HPI: Jeff Lopez is a 72 y.o. presenting for annual physical  I reviewed the past medical history, family history, social history, surgical history, and allergies today and no changes were needed.  Please see the problem list section below in epic for further details.  Past Medical History: Past Medical History:  Diagnosis Date   Allergy    per pt   CAD, NATIVE VESSEL 04/19/2010   nonobstructive by cath 2007:  oLAD 20-30%, mLAD 50%, pCFX 20-30%, oAVCFX 20-30%, L renal art 50%;  normal LVF   Cataract    bil cataracts removed   COPD (chronic obstructive pulmonary disease) (HCC)    Diabetes mellitus without complication (HCC)    GERD 04/09/2007   Headache(784.0)    HYPERLIPIDEMIA 04/09/2007   Patient denies.   HYPERTENSION, BENIGN 04/19/2010   Hypothyroidism    Hypothyroidism    previous hyperthyroidism, s/p I-131   LUMBAR DISC DISORDER 05/27/2010   Nerve damage    Left leg   OSTEOARTHRITIS, LUMBAR SPINE 04/09/2007   RENAL CYST 05/27/2010   Spinal headache    After having back surgery in 2014   Past Surgical History: Past Surgical History:  Procedure Laterality Date   ABDOMINAL AORTOGRAM W/LOWER EXTREMITY N/A 07/03/2022   Procedure: ABDOMINAL AORTOGRAM W/LOWER EXTREMITY;  Surgeon: Maeola Harman, MD;  Location: [facility] INVASIVE CV LAB;  Service: Cardiovascular;  Laterality: N/A;   ABDOMINAL AORTOGRAM W/LOWER EXTREMITY N/A 12/18/2022   Procedure: ABDOMINAL AORTOGRAM W/LOWER EXTREMITY;  Surgeon: Maeola Harman, MD;  Location: [facility] INVASIVE CV LAB;  Service: Cardiovascular;  Laterality: N/A;   BACK SURGERY  2012,2014   CARDIAC CATHETERIZATION  2007   Dr Everardo All   CHOLECYSTECTOMY     CHOLECYSTECTOMY N/A 07/13/2013   Procedure: LAPAROSCOPIC CHOLECYSTECTOMY WITH INTRAOPERATIVE CHOLANGIOGRAM;  Surgeon: Kandis Cocking, MD;  Location: [facility] ORS;  Service: General;  Laterality: N/A;   COLONOSCOPY  2018   COLONOSCOPY W/  POLYPECTOMY     ERCP N/A 07/14/2013   Procedure: ENDOSCOPIC RETROGRADE CHOLANGIOPANCREATOGRAPHY (ERCP);  Surgeon: Rachael Fee, MD;  Location: [facility];  Service: Endoscopy;  Laterality: N/A;   EYE SURGERY Bilateral    Cataract removal   FOOT FRACTURE SURGERY     FRACTURE SURGERY     LUMBAR DISC SURGERY  08/06/2012   L3 & L4   LUMBAR DISC SURGERY  11/11/2015   L1 L2    DR NITKA   LUMBAR FUSION N/A 04/20/2015   Procedure: T12 to L1 fusion (Extension of Previous Fusion L2-S1 to T12-S1), Right Transforaminal lumbar interbody fusion, Posterior Fusion T12 to L1, with Pedicle screws, allograft, local bone graft, and  Vivigen;  Surgeon: Kerrin Champagne, MD;  Location: [facility] OR;  Service: Orthopedics;  Laterality: N/A;   LUMBAR LAMINECTOMY N/A 11/10/2013   Procedure: Left L1-2 far lateral approach to excise herniated nucleus pulposus;  Surgeon: Kerrin Champagne, MD;  Location: [facility] OR;  Service: Orthopedics;  Laterality: N/A;   LUMBAR LAMINECTOMY/DECOMPRESSION MICRODISCECTOMY Left 08/10/2012   Procedure: Dura Repair Left Side L2-L3;  Surgeon: Kerrin Champagne, MD;  Location: [facility] OR;  Service: Orthopedics;  Laterality: Left;  Wilson Frame, Sliding table, dura repair kit, microscope   NECK SURGERY     X 2   PERIPHERAL VASCULAR INTERVENTION Left 07/03/2022   Procedure: PERIPHERAL VASCULAR INTERVENTION;  Surgeon: Maeola Harman, MD;  Location: [facility] INVASIVE CV LAB;  Service: Cardiovascular;  Laterality: Left;  fem-pop   PERIPHERAL VASCULAR INTERVENTION  12/18/2022   Procedure: PERIPHERAL VASCULAR INTERVENTION;  Surgeon: Maeola Harman, MD;  Location: [facility] INVASIVE CV LAB;  Service: Cardiovascular;;   SINOSCOPY     SPINE SURGERY  2006   C-spine surgery x 2   UPPER GASTROINTESTINAL ENDOSCOPY     Social History: Social History   Socioeconomic History   Marital status: Single    Spouse name: Not on file   Number of children: 1   Years of education: Not on file   Highest education level: Not  on file  Occupational History   Occupation: DISTRIBUTION MGR    Employer: [employer]  Tobacco Use   Smoking status: Former    Current packs/day: 0.00    Average packs/day: 1 pack/day for 37.0 years (37.0 ttl pk-yrs)    Types: Cigarettes    Start date: 04/07/1971    Quit date: 04/06/2008    Years since quitting: 14.9   Smokeless tobacco: Never   Tobacco comments:    pt has stopped smoking about 9 months now  Vaping Use   Vaping status: Never Used  Substance and Sexual Activity   Alcohol use: No   Drug use: No   Sexual activity: Not Currently  Other Topics Concern   Not on file  Social History Narrative   Works in Set designer. Retired.   Lives alone, has one child in [place].    Social Determinants of Health   Financial Resource Strain: Low Risk  (09/26/2022)   Overall Financial Resource Strain (CARDIA)    Difficulty of Paying Living Expenses: Not hard at all  Food Insecurity: No Food Insecurity (12/18/2022)   Hunger Vital Sign    Worried About Running Out of Food in the Last Year: Never true    Ran Out of Food in the Last Year: Never true  Transportation Needs: No Transportation Needs (12/18/2022)   PRAPARE - Administrator, Civil Service (Medical): No    Lack of Transportation (Non-Medical): No  Physical Activity: Sufficiently Active (09/26/2022)   Exercise Vital Sign    Days of Exercise per Week: 5 days    Minutes of Exercise per Session: 30 min  Stress: No Stress Concern Present (09/26/2022)   [employer] of Occupational Health - Occupational Stress Questionnaire    Feeling of Stress : Not at all  Social Connections: Socially Isolated (09/26/2022)   Social Connection and Isolation Panel [NHANES]    Frequency of Communication with Friends and Family: More than three times a week    Frequency of Social Gatherings with Friends and Family: More than three times a week    Attends Religious Services: Never    Database administrator or  Organizations: No    Attends Engineer, structural: Never    Marital Status: Never married   Family History: Family History  Problem Relation Age of Onset   Breast cancer Mother    Uterine cancer Mother    Heart disease Mother    Heart attack Mother 20   Prostate cancer Father    Hypertension Brother    Non-Hodgkin's lymphoma Brother    Stomach cancer Paternal Grandmother    Thyroid disease Neg Hx    Colon cancer Neg Hx    Esophageal cancer Neg Hx    Rectal cancer Neg Hx    Pancreatic cancer Neg Hx    Colon polyps Neg Hx    Allergies: Allergies  Allergen Reactions   Ceftin Other (See Comments)    Patient stated it caused "  sores in mouth" Thrush   Cefuroxime Axetil Other (See Comments)    sores in mouth Patient stated it caused "sores in mouth" Thrush    Cephalexin Other (See Comments)    Other   Medications: See med rec.  Review of Systems: No headache, visual changes, nausea, vomiting, diarrhea, constipation, dizziness, abdominal pain, skin rash, fevers, chills, night sweats, swollen lymph nodes, weight loss, chest pain, body aches, joint swelling, muscle aches, shortness of breath, mood changes, visual or auditory hallucinations.  Objective:    BP 135/68   Pulse (!) 58   Ht 5\' 11"  (1.803 m)   Wt 202 lb (91.6 kg)   SpO2 98%   BMI 28.17 kg/m   General: Well Developed, well nourished, and in no acute distress. Neuro: Alert and oriented x3, extra-ocular muscles intact, sensation grossly intact. Cranial nerves II through XII are intact, motor, sensory, and coordinative functions are all intact. HEENT: Normocephalic, atraumatic, pupils equal round reactive to light, neck supple, no masses, no lymphadenopathy, thyroid nonpalpable. Oropharynx, nasopharynx, external ear canals are unremarkable. Skin: Warm and dry, no rashes noted.  Cardiac: Regular rate and rhythm, no murmurs rubs or gallops. Respiratory: Clear to auscultation bilaterally. Not using accessory  muscles, speaking in full sentences. Abdominal: Soft, nontender, nondistended, positive bowel sounds, no masses, no organomegaly. Musculoskeletal: Shoulder, elbow, wrist, hip, knee, ankle stable, and with full range of motion.  Impression and Recommendations:    Type II diabetes mellitus with peripheral autonomic neuropathy [facility]) Assessment & Plan: At last visit, patient was restarted on metformin, he has been taking 500 mg twice daily and tolerating well.  He does indicate that he will occasionally miss doses as he forgets to take it from time to time.  Recent hemoglobin A1c did show slight improvement with medication change with A1c at 8.4%.  Does still remain above goal of less than 7.5%. Discussed options today, in order to improve adherence to recommended dose of metformin, we will switch to once daily extended release version.  We also discussed additional treatment considerations given that A1c is not at goal yet.  We discussed alternative medication classes which can be utilized.  Patient declines addition of any other medications currently.  He would prefer to continue with metformin and focusing on lifestyle modifications  Orders: -     metFORMIN HCl ER; Take 2 tablets (1,000 mg total) by mouth daily with breakfast.  Dispense: 180 tablet; Refill: 1  Wellness examination Assessment & Plan: Routine HCM labs reviewed. HCM reviewed/discussed. Anticipatory guidance regarding healthy weight, lifestyle and choices given. Recommend healthy diet.  Recommend approximately 150 minutes/week of moderate intensity exercise Recommend regular dental and vision exams Always use seatbelt/lap and shoulder restraints Recommend using smoke alarms and checking batteries at least twice a year Recommend using sunscreen when outside Discussed colon cancer screening recommendations, options.  Patient will consider and let us know how he would like to proceed Discussed tetanus immunization recommendations,  patient is UTD Recommend seasonal influenza vaccine, patient amenable, administered today   Encounter for immunization -     Flu Vaccine Trivalent High Dose (Fluad)  Return in about 3 months (around 06/01/2023) for diabetes.   ___________________________________________ Andriana Casa de [place], MD, ABFM, CAQSM Primary Care and Sports Medicine [facility]

## 2023-03-19 ENCOUNTER — Ambulatory Visit (INDEPENDENT_AMBULATORY_CARE_PROVIDER_SITE_OTHER): Payer: PPO | Admitting: Podiatry

## 2023-03-19 DIAGNOSIS — M722 Plantar fascial fibromatosis: Secondary | ICD-10-CM | POA: Diagnosis not present

## 2023-03-21 ENCOUNTER — Institutional Professional Consult (permissible substitution): Payer: PPO | Admitting: Pulmonary Disease

## 2023-03-26 ENCOUNTER — Other Ambulatory Visit: Payer: Self-pay | Admitting: Family Medicine

## 2023-03-26 ENCOUNTER — Telehealth (HOSPITAL_BASED_OUTPATIENT_CLINIC_OR_DEPARTMENT_OTHER): Payer: Self-pay | Admitting: *Deleted

## 2023-03-26 DIAGNOSIS — I10 Essential (primary) hypertension: Secondary | ICD-10-CM

## 2023-03-26 NOTE — Telephone Encounter (Signed)
Pt is requesting refill on lunesta accidentally printed prescription can you please send in for patient

## 2023-03-27 NOTE — Telephone Encounter (Signed)
Noted  

## 2023-04-12 ENCOUNTER — Other Ambulatory Visit (HOSPITAL_BASED_OUTPATIENT_CLINIC_OR_DEPARTMENT_OTHER): Payer: Self-pay | Admitting: Family Medicine

## 2023-04-16 NOTE — Progress Notes (Signed)
Subjective:   Patient ID: Jeff Lopez, male   DOB: 72 y.o.   MRN: [id number]   HPI Patient presents stating pain is marginally better in the heel with discomfort still upon deep palpation   ROS      Objective:  Physical Exam  Neurovascular status intact inflammation pain plantar heel improved but still present     Assessment:  Plan fasciitis still giving some problems     Plan:  Reviewed different treatment options anti-inflammatories physical therapy and support and will hold off on other treatments currently may require the treatment in future

## 2023-04-24 ENCOUNTER — Other Ambulatory Visit: Payer: Self-pay | Admitting: Physician Assistant

## 2023-04-24 ENCOUNTER — Other Ambulatory Visit: Payer: Self-pay | Admitting: Family Medicine

## 2023-04-24 DIAGNOSIS — E890 Postprocedural hypothyroidism: Secondary | ICD-10-CM

## 2023-04-26 ENCOUNTER — Other Ambulatory Visit (HOSPITAL_BASED_OUTPATIENT_CLINIC_OR_DEPARTMENT_OTHER): Payer: Self-pay | Admitting: Family Medicine

## 2023-05-17 ENCOUNTER — Encounter (HOSPITAL_BASED_OUTPATIENT_CLINIC_OR_DEPARTMENT_OTHER): Payer: Self-pay | Admitting: Family Medicine

## 2023-05-23 ENCOUNTER — Other Ambulatory Visit: Payer: Self-pay | Admitting: Physician Assistant

## 2023-05-29 ENCOUNTER — Encounter (HOSPITAL_BASED_OUTPATIENT_CLINIC_OR_DEPARTMENT_OTHER): Payer: Self-pay | Admitting: Family Medicine

## 2023-05-29 ENCOUNTER — Ambulatory Visit (INDEPENDENT_AMBULATORY_CARE_PROVIDER_SITE_OTHER): Payer: PPO | Admitting: Family Medicine

## 2023-05-29 VITALS — BP 141/85 | HR 53 | Ht 71.0 in | Wt 204.0 lb

## 2023-05-29 DIAGNOSIS — L578 Other skin changes due to chronic exposure to nonionizing radiation: Secondary | ICD-10-CM | POA: Insufficient documentation

## 2023-05-29 DIAGNOSIS — R0981 Nasal congestion: Secondary | ICD-10-CM | POA: Diagnosis not present

## 2023-05-29 NOTE — Patient Instructions (Addendum)
Use nasal saline spray as needed  Use Coricidin HBP- cough & cold  Use azelastine (Astepro) nasal spray  Use humidifier at night    Stay hydrated

## 2023-05-29 NOTE — Progress Notes (Signed)
Acute Office Visit  Subjective:     Patient ID: Jeff Lopez, male    DOB: July 26, 1950, 72 y.o.   MRN: [id number]   Chief Complaint  Patient presents with   Cough    Pt has cough congestion sinus drainage feels like on and off fever has had this has been going on for 3 weeks    Jeff Lopez is a 71 year old male patient who presents for an acute visit today.   Presents today for an acute visit with complaint of nasal congestion, non-productive cough, & on/off fever x2-3 weeks. He reports he has had chronic sinus problems- to the point where he has seen an ENT in the past. Reports postnasal drip that is causing him to cough. He reports he had covid before but "it feels different." Feels shortness of breath with exertion, denies lower extremity edema.  States that he "just doesn't feel good." Denies N/V, fatigue, ear pain/pressure, changes to vision, chest pain, palpitations. Reports that he has not tried anything- was going to try Mucinex but wanted to be seen first.   Review of Systems  Constitutional:  Positive for chills (feels like it comes on/off). Negative for fever, malaise/fatigue and weight loss.  HENT:  Positive for congestion, sore throat and tinnitus. Negative for ear discharge, ear pain and sinus pain.   Respiratory:  Positive for cough (non-productive) and shortness of breath (with exertion). Negative for wheezing and stridor.   Cardiovascular:  Negative for chest pain, palpitations, leg swelling and PND.  Gastrointestinal:  Negative for abdominal pain, nausea and vomiting.  Neurological:  Positive for headaches (occ). Negative for speech change and focal weakness.  Psychiatric/Behavioral:  Negative for depression and suicidal ideas. The patient is not nervous/anxious and does not have insomnia.      Objective:    BP (!) 141/85   Pulse (!) 53   Ht 5\' 11"  (1.803 m)   Wt 204 lb (92.5 kg)   SpO2 96%   BMI 28.45 kg/m   Physical Exam Vitals reviewed.   Constitutional:      Appearance: Normal appearance.  HENT:     Head: Normocephalic.     Salivary Glands: Right salivary gland is not diffusely enlarged or tender. Left salivary gland is not diffusely enlarged or tender.     Right Ear: Hearing, tympanic membrane, ear canal and external ear normal.     Left Ear: Hearing, tympanic membrane, ear canal and external ear normal.     Nose:     Right Turbinates: Enlarged.     Left Turbinates: Enlarged.     Right Sinus: No maxillary sinus tenderness or frontal sinus tenderness.     Left Sinus: No maxillary sinus tenderness or frontal sinus tenderness.     Mouth/Throat:     Mouth: Mucous membranes are moist.     Pharynx: Posterior oropharyngeal erythema (slight) and postnasal drip present. No pharyngeal swelling, oropharyngeal exudate or uvula swelling.     Tonsils: No tonsillar exudate.  Cardiovascular:     Rate and Rhythm: Regular rhythm. Bradycardia present.     Pulses: Normal pulses.     Heart sounds: Normal heart sounds.  Pulmonary:     Effort: Pulmonary effort is normal.     Breath sounds: Normal breath sounds.  Neurological:     Mental Status: He is alert.  Psychiatric:        Mood and Affect: Mood normal.        Behavior: Behavior normal.  Assessment & Plan:   1. Mild nasal congestion Patient is a pleasant 72 year old male patient who presents today for an acute visit concerning nasal congestion and ongoing cough. He reports that he is unsure if he has experienced a fever but reports he feels as if he has had chills on/off. Reports a lot of nasal congestion and postnasal drip- causing him to cough and feel as if his throat is dry. Patient in no acute distress and is well-appearing. Cardiovascular exam with normal heart sounds, no murmurs present. No lower extremity edema present. Lungs clear to auscultation bilaterally in all fields- no adventitious lung sounds present. Nasal turbinates swollen and erythematous. Posterior pharynx  notable for slight erythema. No tenderness noted in frontal or maxillary sinuses. Less concerns for acute heart failure, pneumonia, bronchitis. More likely viral URI or acute sinusitis. Advised patient to use nasal saline nasal spray and anti-histamine/steroid nasal spray, such as Nasacort or Astepro, for relief of nasal congestion. Advised him if he is not feeling better by next week, may be reasonable to treat with antibiotic for possible sinus infection.   Return if symptoms worsen or fail to improve.  Alyson Reedy, FNP

## 2023-06-01 ENCOUNTER — Other Ambulatory Visit (HOSPITAL_BASED_OUTPATIENT_CLINIC_OR_DEPARTMENT_OTHER): Payer: Self-pay | Admitting: Family Medicine

## 2023-06-01 ENCOUNTER — Ambulatory Visit (INDEPENDENT_AMBULATORY_CARE_PROVIDER_SITE_OTHER): Payer: PPO | Admitting: Family Medicine

## 2023-06-01 ENCOUNTER — Encounter (HOSPITAL_BASED_OUTPATIENT_CLINIC_OR_DEPARTMENT_OTHER): Payer: Self-pay | Admitting: Family Medicine

## 2023-06-01 VITALS — BP 149/76 | HR 56 | Temp 97.9°F | Wt 202.6 lb

## 2023-06-01 DIAGNOSIS — J329 Chronic sinusitis, unspecified: Secondary | ICD-10-CM | POA: Insufficient documentation

## 2023-06-01 DIAGNOSIS — Z7984 Long term (current) use of oral hypoglycemic drugs: Secondary | ICD-10-CM

## 2023-06-01 DIAGNOSIS — J019 Acute sinusitis, unspecified: Secondary | ICD-10-CM

## 2023-06-01 DIAGNOSIS — E1143 Type 2 diabetes mellitus with diabetic autonomic (poly)neuropathy: Secondary | ICD-10-CM | POA: Diagnosis not present

## 2023-06-01 MED ORDER — AMOXICILLIN-POT CLAVULANATE 875-125 MG PO TABS
1.0000 | ORAL_TABLET | Freq: Two times a day (BID) | ORAL | 0 refills | Status: DC
Start: 2023-06-01 — End: 2023-06-08

## 2023-06-01 NOTE — Progress Notes (Unsigned)
    Procedures performed today:    None.  Independent interpretation of notes and tests performed by another provider:   None.  Brief History, Exam, Impression, and Recommendations:    BP (!) 149/76 (BP Location: Left Arm, Patient Position: Sitting, Cuff Size: Normal)   Pulse (!) 56   Temp 97.9 F (36.6 C) (Oral)   Wt 202 lb 9.6 oz (91.9 kg)   SpO2 98%   BMI 28.26 kg/m   There are no diagnoses linked to this encounter.No follow-ups on file.   ___________________________________________ Jeff Willette de [place], MD, ABFM, [facility] Primary Care and Sports Medicine [facility]

## 2023-06-02 LAB — MICROALBUMIN / CREATININE URINE RATIO
Creatinine, Urine: 127.5 mg/dL
Microalb/Creat Ratio: 180 mg/g{creat} — ABNORMAL HIGH (ref 0–29)
Microalbumin, Urine: 229.5 ug/mL

## 2023-06-02 LAB — HEMOGLOBIN A1C
A1c: 12.3
Est. average glucose Bld gHb Est-mCnc: 306 mg/dL
Hgb A1c MFr Bld: 12.3 % — ABNORMAL HIGH (ref 4.8–5.6)

## 2023-06-05 NOTE — Assessment & Plan Note (Signed)
Recently seen in the office for acute visit for URI.  Felt to be viral in nature at that time.  Unfortunately, he has continued to have notable symptoms of sinus congestion, headaches.  Not necessarily aware of any fevers.  Denies any significant shortness of breath or trouble breathing. On exam, patient is in no acute distress, mildly ill-appearing.  Does have bilateral sinus tenderness to palpation, mild pharyngeal erythema. Given duration of symptoms and continued sinus congestion, sinus tenderness to palpation, would be reasonable to proceed with antibiotic therapy at this time as there is certainly the possibility that initial viral sinusitis has transition to acute bacterial sinusitis.  Reviewed allergies with patient, no reported allergy to penicillin based antibiotics or amoxicillin in the past.  Prescription for Augmentin sent to pharmacy on file.  Recommend also continuing with conservative measures to assist with symptom control.  Advised that would expect to see initial symptom improvement within 72 hours.  Instructed on completing entire course of antibiotics.  If not improving as expected, recommend returning to the office for further evaluation

## 2023-06-05 NOTE — Assessment & Plan Note (Signed)
Patient continues with metformin, however does occasionally have missed doses.  Does have some intermittent GI symptoms, however generally has been tolerating medication well.  Prior hemoglobin A1c was above goal at 8.4%.  Goal for patient is to obtain hemoglobin A1c less than 7.5%. Today, he is due for recheck of hemoglobin A1c as well as recheck of urine microalbumin/creatinine ratio. We will check hemoglobin A1c and determine if further medication adjustment is needed in order to optimally control blood sugars.

## 2023-06-08 ENCOUNTER — Ambulatory Visit (HOSPITAL_BASED_OUTPATIENT_CLINIC_OR_DEPARTMENT_OTHER): Payer: PPO | Admitting: Family Medicine

## 2023-06-08 ENCOUNTER — Encounter (HOSPITAL_BASED_OUTPATIENT_CLINIC_OR_DEPARTMENT_OTHER): Payer: Self-pay | Admitting: Family Medicine

## 2023-06-08 VITALS — BP 128/78 | HR 61 | Ht 71.0 in | Wt 204.1 lb

## 2023-06-08 DIAGNOSIS — E1143 Type 2 diabetes mellitus with diabetic autonomic (poly)neuropathy: Secondary | ICD-10-CM

## 2023-06-08 DIAGNOSIS — C44619 Basal cell carcinoma of skin of left upper limb, including shoulder: Secondary | ICD-10-CM | POA: Insufficient documentation

## 2023-06-08 DIAGNOSIS — C44519 Basal cell carcinoma of skin of other part of trunk: Secondary | ICD-10-CM | POA: Insufficient documentation

## 2023-06-08 DIAGNOSIS — Z7984 Long term (current) use of oral hypoglycemic drugs: Secondary | ICD-10-CM

## 2023-06-08 MED ORDER — GLIPIZIDE ER 5 MG PO TB24
5.0000 mg | ORAL_TABLET | Freq: Every day | ORAL | 1 refills | Status: DC
Start: 2023-06-08 — End: 2023-08-02

## 2023-06-08 NOTE — Progress Notes (Unsigned)
    Procedures performed today:    None.  Independent interpretation of notes and tests performed by another provider:   None.  Brief History, Exam, Impression, and Recommendations:    BP 128/78 (BP Location: Left Arm, Patient Position: Sitting, Cuff Size: Normal)   Pulse 61   Ht 5\' 11"  (1.803 m)   Wt 204 lb 1.6 oz (92.6 kg)   SpO2 97%   BMI 28.47 kg/m   Type II diabetes mellitus with peripheral autonomic neuropathy (HCC) Assessment & Plan: Patient with notable increase in hemoglobin A1c to 12.3%, was at 8.4%.  He continues with metformin and lifestyle modifications, however does report that he has had some indiscretions in regards to modifications over the past couple weeks.  We did discuss notable increase in A1c and need for better control of blood sugars.  He is reporting some polyuria and polydipsia.  Discussed that this can commonly be seen when blood sugars are running high. We discussed options today, patient continues to be somewhat resistant regarding medication changes, and in particular injectable medication options.  After discussion, elected to continue with metformin as well as add glipizide, instructed on proper use, potential risk/side effects.  Will plan for close follow-up in about 1 month to monitor symptoms and assess for any further medication changes needed  Orders: -     glipiZIDE ER; Take 1 tablet (5 mg total) by mouth daily with breakfast.  Dispense: 30 tablet; Refill: 1  Return in about 1 month (around 07/09/2023) for diabetes, med check.  Spent 32 minutes on this patient encounter, including preparation, chart review, face-to-face counseling with patient and coordination of care, and documentation of encounter   ___________________________________________ Shineka Auble de [place], MD, ABFM, [facility] Primary Care and Sports Medicine [facility]

## 2023-06-08 NOTE — Patient Instructions (Signed)
  Medication Instructions:  Your physician recommends that you continue on your current medications as directed. Please refer to the Current Medication list given to you today. --If you need a refill on any your medications before your next appointment, please call your pharmacy first. If no refills are authorized on file call the office.--    Follow-Up: Your next appointment:   Your physician recommends that you schedule a follow-up appointment in: 1 month follow up  with Dr. de [place]  You will receive a text message or e-mail with a link to a survey about your care and experience with [place] today! We would greatly appreciate your feedback!   Thanks for letting us be apart of your health journey!!  Primary Care and Sports Medicine   Dr. Arlina Robes [place]   We encourage you to activate your patient portal called "MyChart".  Sign up information is provided on this After Visit Summary.  MyChart is used to connect with patients for Virtual Visits (Telemedicine).  Patients are able to view lab/test results, encounter notes, upcoming appointments, etc.  Non-urgent messages can be sent to your provider as well. To learn more about what you can do with MyChart, please visit --  [email or web address].

## 2023-06-12 NOTE — Assessment & Plan Note (Signed)
Patient with notable increase in hemoglobin A1c to 12.3%, was at 8.4%.  He continues with metformin and lifestyle modifications, however does report that he has had some indiscretions in regards to modifications over the past couple weeks.  We did discuss notable increase in A1c and need for better control of blood sugars.  He is reporting some polyuria and polydipsia.  Discussed that this can commonly be seen when blood sugars are running high. We discussed options today, patient continues to be somewhat resistant regarding medication changes, and in particular injectable medication options.  After discussion, elected to continue with metformin as well as add glipizide, instructed on proper use, potential risk/side effects.  Will plan for close follow-up in about 1 month to monitor symptoms and assess for any further medication changes needed

## 2023-06-14 ENCOUNTER — Other Ambulatory Visit (HOSPITAL_BASED_OUTPATIENT_CLINIC_OR_DEPARTMENT_OTHER): Payer: Self-pay | Admitting: Family Medicine

## 2023-06-14 DIAGNOSIS — E1143 Type 2 diabetes mellitus with diabetic autonomic (poly)neuropathy: Secondary | ICD-10-CM

## 2023-06-26 ENCOUNTER — Other Ambulatory Visit: Payer: Self-pay | Admitting: Family Medicine

## 2023-06-26 DIAGNOSIS — I10 Essential (primary) hypertension: Secondary | ICD-10-CM

## 2023-07-04 ENCOUNTER — Encounter: Payer: Self-pay | Admitting: Podiatry

## 2023-07-04 ENCOUNTER — Ambulatory Visit (INDEPENDENT_AMBULATORY_CARE_PROVIDER_SITE_OTHER): Payer: PPO

## 2023-07-04 ENCOUNTER — Ambulatory Visit: Payer: PPO | Admitting: Podiatry

## 2023-07-04 DIAGNOSIS — M2041 Other hammer toe(s) (acquired), right foot: Secondary | ICD-10-CM | POA: Diagnosis not present

## 2023-07-04 DIAGNOSIS — M79671 Pain in right foot: Secondary | ICD-10-CM

## 2023-07-04 DIAGNOSIS — M79672 Pain in left foot: Secondary | ICD-10-CM

## 2023-07-04 DIAGNOSIS — M722 Plantar fascial fibromatosis: Secondary | ICD-10-CM

## 2023-07-04 MED ORDER — TRIAMCINOLONE ACETONIDE 10 MG/ML IJ SUSP
10.0000 mg | Freq: Once | INTRAMUSCULAR | Status: AC
Start: 2023-07-04 — End: 2023-07-04
  Administered 2023-07-04: 10 mg via INTRA_ARTICULAR

## 2023-07-06 NOTE — Progress Notes (Signed)
 Subjective:   Patient ID: Jeff Lopez, male   DOB: 73 y.o.   MRN: [id number]   HPI Patient presents stating that he is having problems with both feet with elevation of his lesser digits right over left moderate rigid contracture that become painful and a lot of discomfort in the plantar aspect of the left heel.  Good digital perfusion well-oriented   ROS      Objective:  Physical Exam  Hammertoe deformity second and third digits right with rigid contracture along with inflammation pain of the plantar fascia left with fluid buildup around the medial band     Assessment:  Separate problems with 1 being hammertoe deformity right with rigid contracture and #2 acute fascial inflammation left with pain     Plan:  H&P reviewed both conditions and at this point for the toes discussed digital stabilization with fusion procedure and pins and for the left I went ahead and I did sterile prep injected the fascia at insertion 3 mg Kenalog  5 mg Xylocaine 

## 2023-07-11 ENCOUNTER — Ambulatory Visit (HOSPITAL_COMMUNITY)
Admission: RE | Admit: 2023-07-11 | Discharge: 2023-07-11 | Disposition: A | Discharge status: Home / Self Care | Payer: PPO | Source: Ambulatory Visit | Attending: Vascular Surgery | Admitting: Vascular Surgery

## 2023-07-11 ENCOUNTER — Ambulatory Visit (INDEPENDENT_AMBULATORY_CARE_PROVIDER_SITE_OTHER)
Admission: RE | Admit: 2023-07-11 | Discharge: 2023-07-11 | Disposition: A | Discharge status: Home / Self Care | Payer: PPO | Source: Ambulatory Visit | Attending: Vascular Surgery | Admitting: Vascular Surgery

## 2023-07-11 ENCOUNTER — Ambulatory Visit: Payer: PPO | Admitting: Vascular Surgery

## 2023-07-11 ENCOUNTER — Encounter: Payer: Self-pay | Admitting: Vascular Surgery

## 2023-07-11 VITALS — BP 170/83 | HR 50 | Temp 97.7°F | Resp 20 | Ht 71.0 in | Wt 205.0 lb

## 2023-07-11 DIAGNOSIS — I739 Peripheral vascular disease, unspecified: Secondary | ICD-10-CM | POA: Insufficient documentation

## 2023-07-11 DIAGNOSIS — I70212 Atherosclerosis of native arteries of extremities with intermittent claudication, left leg: Secondary | ICD-10-CM

## 2023-07-11 LAB — VAS US ABI WITH/WO TBI
Left ABI: 1.16
Right ABI: 1.09

## 2023-07-11 NOTE — Progress Notes (Signed)
**Note Jeff-Identified via Obfuscation**  Patient ID: Jeff Lopez, male   DOB: Nov 17, 1950, 73 y.o.   MRN: [id number]  Reason for Consult: Follow-up   Referred by Jeff Peru, Jeff J, MD  Subjective:     HPI:  Jeff Lopez is a 73 y.o. male with history of left SFA stenting on 2 separate occasions for left greater than right lower extremity claudication.  His biggest issues now are plantar fasciitis of the left foot and hammertoe deformity of the right third toe.  He states that this has been his greatest limitation to walking.  He continues on aspirin , Plavix  and statin and is hopeful to discontinue Plavix  in the future.  Denies any claudication symptoms at this time.  Past Medical History:  Diagnosis Date   Allergy    per pt   CAD, NATIVE VESSEL 04/19/2010   nonobstructive by cath 2007:  oLAD 20-30%, mLAD 50%, pCFX 20-30%, oAVCFX 20-30%, L renal art 50%;  normal LVF   Cataract    bil cataracts removed   COPD (chronic obstructive pulmonary disease) (HCC)    Diabetes mellitus without complication (HCC)    GERD 04/09/2007   Headache(784.0)    HYPERLIPIDEMIA 04/09/2007   Patient denies.   HYPERTENSION, BENIGN 04/19/2010   Hypothyroidism    Hypothyroidism    previous hyperthyroidism, s/p I-131   LUMBAR DISC DISORDER 05/27/2010   Nerve damage    Left leg   OSTEOARTHRITIS, LUMBAR SPINE 04/09/2007   RENAL CYST 05/27/2010   Spinal headache    After having back surgery in 2014   Family History  Problem Relation Age of Onset   Breast cancer Mother    Uterine cancer Mother    Heart disease Mother    Heart attack Mother 80   Prostate cancer Father    Hypertension Brother    Non-Hodgkin's lymphoma Brother    Stomach cancer Paternal Grandmother    Thyroid  disease Neg Hx    Colon cancer Neg Hx    Esophageal cancer Neg Hx    Rectal cancer Neg Hx    Pancreatic cancer Neg Hx    Colon polyps Neg Hx    Past Surgical History:  Procedure Laterality Date   ABDOMINAL AORTOGRAM W/LOWER EXTREMITY N/A 07/03/2022    Procedure: ABDOMINAL AORTOGRAM W/LOWER EXTREMITY;  Surgeon: Jeff Hoof, MD;  Location: [facility] INVASIVE CV LAB;  Service: Cardiovascular;  Laterality: N/A;   ABDOMINAL AORTOGRAM W/LOWER EXTREMITY N/A 12/18/2022   Procedure: ABDOMINAL AORTOGRAM W/LOWER EXTREMITY;  Surgeon: Jeff Hoof, MD;  Location: [facility] INVASIVE CV LAB;  Service: Cardiovascular;  Laterality: N/A;   BACK SURGERY  2012,2014   CARDIAC CATHETERIZATION  2007   Dr Washington Hacker   CHOLECYSTECTOMY     CHOLECYSTECTOMY N/A 07/13/2013   Procedure: LAPAROSCOPIC CHOLECYSTECTOMY WITH INTRAOPERATIVE CHOLANGIOGRAM;  Surgeon: Thayne Fine, MD;  Location: [facility] ORS;  Service: General;  Laterality: N/A;   COLONOSCOPY  2018   COLONOSCOPY W/ POLYPECTOMY     ERCP N/A 07/14/2013   Procedure: ENDOSCOPIC RETROGRADE CHOLANGIOPANCREATOGRAPHY (ERCP);  Surgeon: Janel Medford, MD;  Location: [facility];  Service: Endoscopy;  Laterality: N/A;   EYE SURGERY Bilateral    Cataract removal   FOOT FRACTURE SURGERY     FRACTURE SURGERY     LUMBAR DISC SURGERY  08/06/2012   L3 & L4   LUMBAR DISC SURGERY  11/11/2015   L1 L2    DR NITKA   LUMBAR FUSION N/A 04/20/2015   Procedure: T12 to L1 fusion (Extension of Previous Fusion L2-S1  to T12-S1), Right Transforaminal lumbar interbody fusion, Posterior Fusion T12 to L1, with Pedicle screws, allograft, local bone graft, and  Vivigen;  Surgeon: Alphonso Jean, MD;  Location: [facility] OR;  Service: Orthopedics;  Laterality: N/A;   LUMBAR LAMINECTOMY N/A 11/10/2013   Procedure: Left L1-2 far lateral approach to excise herniated nucleus pulposus;  Surgeon: Alphonso Jean, MD;  Location: [facility] OR;  Service: Orthopedics;  Laterality: N/A;   LUMBAR LAMINECTOMY/DECOMPRESSION MICRODISCECTOMY Left 08/10/2012   Procedure: Dura Repair Left Side L2-L3;  Surgeon: Alphonso Jean, MD;  Location: [facility] OR;  Service: Orthopedics;  Laterality: Left;  Wilson Frame, Sliding table, dura repair kit, microscope   NECK SURGERY     X 2    PERIPHERAL VASCULAR INTERVENTION Left 07/03/2022   Procedure: PERIPHERAL VASCULAR INTERVENTION;  Surgeon: Jeff Hoof, MD;  Location: [facility] INVASIVE CV LAB;  Service: Cardiovascular;  Laterality: Left;  fem-pop   PERIPHERAL VASCULAR INTERVENTION  12/18/2022   Procedure: PERIPHERAL VASCULAR INTERVENTION;  Surgeon: Jeff Hoof, MD;  Location: [facility] INVASIVE CV LAB;  Service: Cardiovascular;;   SINOSCOPY     SPINE SURGERY  2006   C-spine surgery x 2   UPPER GASTROINTESTINAL ENDOSCOPY      Short Social History:  Social History   Tobacco Use   Smoking status: Former    Current packs/day: 0.00    Average packs/day: 1 pack/day for 37.0 years (37.0 ttl pk-yrs)    Types: Cigarettes    Start date: 04/07/1971    Quit date: 04/06/2008    Years since quitting: 15.2    Passive exposure: Past   Smokeless tobacco: Never   Tobacco comments:    pt has stopped smoking about 9 months now  Substance Use Topics   Alcohol use: No    Allergies  Allergen Reactions   Ceftin  Other (See Comments)    Patient stated it caused "sores in mouth" Thrush   Cefuroxime  Axetil Other (See Comments)    sores in mouth Patient stated it caused "sores in mouth" Thrush    Cephalexin Other (See Comments)    Other    Current Outpatient Medications  Medication Sig Dispense Refill   amLODipine -olmesartan  (AZOR ) 5-40 MG tablet TAKE 1 TABLET BY MOUTH ONCE DAILY 90 tablet 0   aspirin  EC 81 MG tablet Take 81 mg by mouth daily. Swallow whole.     clopidogrel  (PLAVIX ) 75 MG tablet TAKE 1 TABLET BY MOUTH EVERY DAY 30 tablet 11   eszopiclone  (LUNESTA ) 2 MG TABS tablet TAKE 1 TABLET BY MOUTH AT BEDTIME 90 tablet 0   glipiZIDE  (GLUCOTROL  XL) 5 MG 24 hr tablet Take 1 tablet (5 mg total) by mouth daily with breakfast. 30 tablet 1   levothyroxine  (SYNTHROID ) 137 MCG tablet TAKE 1 TABLET BY MOUTH EVERY DAY BEFORE BREAKFAST 90 tablet 1   metFORMIN  (GLUCOPHAGE -XR) 500 MG 24 hr tablet Take 2 tablets (1,000 mg  total) by mouth daily with breakfast. (Patient taking differently: Take 1,000 mg by mouth every other day. Every other day due to diarrhea) 180 tablet 1   metoprolol  succinate (TOPROL -XL) 50 MG 24 hr tablet TAKE 1 TABLET BY MOUTH EVERY DAY WITH OR IMMEDIATELY FOLLOWING A MEAL (Patient taking differently: Take 50 mg by mouth daily.) 90 tablet 3   pantoprazole  (PROTONIX ) 40 MG tablet TAKE 1 TABLET BY MOUTH 2 TIMES DAILY BEFORE A MEAL (Patient taking differently: Take 40 mg by mouth 2 (two) times daily.) 180 tablet 3   rosuvastatin  (CRESTOR ) 20 MG tablet  TAKE 1 TABLET BY MOUTH EVERY DAY 90 tablet 1   No current facility-administered medications for this visit.    Review of Systems  Constitutional:  Constitutional negative. HENT: HENT negative.  Eyes: Eyes negative.  Respiratory: Respiratory negative.  Cardiovascular: Cardiovascular negative.  GI: Gastrointestinal negative.  Musculoskeletal:       Toe pain on the right, plantar fascial pain on the left Skin: Skin negative.  Neurological: Neurological negative. Hematologic: Hematologic/lymphatic negative.  Psychiatric: Psychiatric negative.        Objective:  Objective   Vitals:   07/11/23 1137  BP: (!) 170/83  Pulse: (!) 50  Resp: 20  Temp: 97.7 F (36.5 C)  SpO2: 95%  Weight: 205 lb (93 kg)  Height: 5\' 11"  (1.803 m)   Body mass index is 28.59 kg/m.  Physical Exam Constitutional:      Appearance: He is obese.  HENT:     Head: Normocephalic.     Nose: Nose normal.  Eyes:     Pupils: Pupils are equal, round, and reactive to light.  Cardiovascular:     Pulses:          Dorsalis pedis pulses are 2+ on the right side.       Posterior tibial pulses are 2+ on the left side.  Pulmonary:     Effort: Pulmonary effort is normal.  Abdominal:     General: Abdomen is flat.  Musculoskeletal:     Cervical back: Normal range of motion and neck supple.  Skin:    General: Skin is warm.     Capillary Refill: Capillary refill  takes less than 2 seconds.  Neurological:     General: No focal deficit present.     Mental Status: He is alert.  Psychiatric:        Mood and Affect: Mood normal.     Data: ABI Findings:  +---------+------------------+-----+---------+--------+  Right   Rt Pressure (mmHg)IndexWaveform Comment   +---------+------------------+-----+---------+--------+  PTA     163               1.03 biphasic           +---------+------------------+-----+---------+--------+  DP      172               1.09 triphasic          +---------+------------------+-----+---------+--------+  Great Toe121               0.77                    +---------+------------------+-----+---------+--------+   +---------+------------------+-----+---------+-------+  Left    Lt Pressure (mmHg)IndexWaveform Comment  +---------+------------------+-----+---------+-------+  Brachial 158                                      +---------+------------------+-----+---------+-------+  PTA     184               1.16 triphasic         +---------+------------------+-----+---------+-------+  DP      174               1.10 triphasic         +---------+------------------+-----+---------+-------+  Darene Economy               0.74                   +---------+------------------+-----+---------+-------+   +-------+-----------+-----------+------------+------------+  ABI/TBIToday's ABIToday's TBIPrevious ABIPrevious TBI  +-------+-----------+-----------+------------+------------+  Right 1.09       0.77       1.04        0.70          +-------+-----------+-----------+------------+------------+  Left  1.16       0.74       1.21        0.88          +-------+-----------+-----------+------------+------------+       Bilateral ABIs appear essentially unchanged compared to prior study on  01/24/2023.    Summary:  Right: Resting right ankle-brachial index is within normal  range. The  right toe-brachial index is normal.   Left: Resting left ankle-brachial index is within normal range. The left  toe-brachial index is normal. .  LEFT      PSV cm/sRatioStenosisWaveformComments  +----------+--------+-----+--------+--------+--------+  CFA Distal133                  biphasic          +----------+--------+-----+--------+--------+--------+  SFA Prox  110                  biphasic          +----------+--------+-----+--------+--------+--------+     Left Stent(s):  +---------------+--------+--------+--------+--------+  proximal SFA   PSV cm/sStenosisWaveformComments  +---------------+--------+--------+--------+--------+  Prox to Stent  81              biphasic          +---------------+--------+--------+--------+--------+  Proximal Stent 101             biphasic          +---------------+--------+--------+--------+--------+  Mid Stent      119             biphasic          +---------------+--------+--------+--------+--------+  Distal Stent   126             biphasic          +---------------+--------+--------+--------+--------+  Distal to Stent117             biphasic          +---------------+--------+--------+--------+--------+      +--------------------+--------+--------+--------+--------+  mid SFA to poplitealPSV cm/sStenosisWaveformComments  +--------------------+--------+--------+--------+--------+  Prox to Stent       78              biphasic          +--------------------+--------+--------+--------+--------+  Proximal Stent      94              biphasic          +--------------------+--------+--------+--------+--------+  Mid Stent           111             biphasic          +--------------------+--------+--------+--------+--------+  Distal Stent        101             biphasic          +--------------------+--------+--------+--------+--------+  Distal to Stent      125             biphasic          +--------------------+--------+--------+--------+--------+        Summary:  Left: Widely patent proximal SFA stent.  Widely patent mid SFA to popliteal stent.       Assessment/Plan:     73 year old male status post stenting on  the left for claudication which is now resolved.  We have discussed that given he required repeat stenting I would recommend Plavix  out to 1 year as well as a stent looks normal at that time we can discontinue and keep him on single agent antiplatelet with aspirin .  He demonstrates good understanding of this.  I will have him follow-up in 6 months with repeat noninvasive studies.     Jeff Hoof MD [facility]

## 2023-07-19 ENCOUNTER — Other Ambulatory Visit (HOSPITAL_BASED_OUTPATIENT_CLINIC_OR_DEPARTMENT_OTHER): Payer: Self-pay | Admitting: Family Medicine

## 2023-07-19 ENCOUNTER — Encounter (HOSPITAL_BASED_OUTPATIENT_CLINIC_OR_DEPARTMENT_OTHER): Payer: Self-pay | Admitting: Family Medicine

## 2023-07-19 ENCOUNTER — Ambulatory Visit (INDEPENDENT_AMBULATORY_CARE_PROVIDER_SITE_OTHER): Payer: PPO | Admitting: Family Medicine

## 2023-07-19 VITALS — BP 136/88 | HR 61 | Ht 70.0 in | Wt 205.3 lb

## 2023-07-19 DIAGNOSIS — Z7984 Long term (current) use of oral hypoglycemic drugs: Secondary | ICD-10-CM

## 2023-07-19 DIAGNOSIS — I10 Essential (primary) hypertension: Secondary | ICD-10-CM

## 2023-07-19 DIAGNOSIS — E1143 Type 2 diabetes mellitus with diabetic autonomic (poly)neuropathy: Secondary | ICD-10-CM

## 2023-07-19 DIAGNOSIS — R809 Proteinuria, unspecified: Secondary | ICD-10-CM | POA: Diagnosis not present

## 2023-07-19 NOTE — Progress Notes (Signed)
    Procedures performed today:    None.  Independent interpretation of notes and tests performed by another provider:   None.  Brief History, Exam, Impression, and Recommendations:    BP (!) 147/79 (BP Location: Right Arm, Patient Position: Sitting, Cuff Size: Normal)   Pulse 61   Ht 5\' 10"  (1.778 m)   Wt 205 lb 4.8 oz (93.1 kg)   SpO2 98%   BMI 29.46 kg/m   Type II diabetes mellitus with peripheral autonomic neuropathy (HCC) Assessment & Plan: Patient with prior notable increase in hemoglobin A1c to 12.3%, was at 8.4%.  He continues with metformin and glipizide which was started at last visit as well as lifestyle modifications.  We did discuss notable increase in A1c and need for better control of blood sugars.  He is reports prior polyuria and polydipsia has notably improved. We will plan for follow-up in about 6 weeks with A1c check 1 week prior. We also discussed possibility of injectable medication options. Would be open to injectable - would not be able to inject himself, but open to coming to office for nurse visit for this.  Will plan for close follow-up in about 6 weeks Foot exam completed today  Orders: -     Hemoglobin A1c; Future  Primary hypertension Assessment & Plan: Blood pressure borderline in office today.  Recommend continuing current medications, no changes to be made today Recommend intermittent monitoring of blood pressure at home, DASH diet   Moderately increased albuminuria Assessment & Plan: On recent labs, found to have moderately elevated albuminuria.  Discussed importance of maintaining adequate blood sugar control.  Globin A1c has not been well-controlled recently, next recheck for this is in 6 weeks.  In regards to medications, may need to consider SGLT2 inhibitor, however patient is somewhat reluctant to add additional oral medications.   Return in about 7 weeks (around 09/06/2023) for diabetes.  Spent 34 minutes on this patient encounter,  including preparation, chart review, face-to-face counseling with patient and coordination of care, and documentation of encounter   ___________________________________________ Deral Schellenberg de Peru, MD, ABFM, [facility] Primary Care and Sports Medicine [facility]

## 2023-07-19 NOTE — Assessment & Plan Note (Signed)
On recent labs, found to have moderately elevated albuminuria.  Discussed importance of maintaining adequate blood sugar control.  Globin A1c has not been well-controlled recently, next recheck for this is in 6 weeks.  In regards to medications, may need to consider SGLT2 inhibitor, however patient is somewhat reluctant to add additional oral medications.

## 2023-07-19 NOTE — Patient Instructions (Signed)
  Medication Instructions:  Your physician recommends that you continue on your current medications as directed. Please refer to the Current Medication list given to you today. --If you need a refill on any your medications before your next appointment, please call your pharmacy first. If no refills are authorized on file call the office.-- Lab Work: Your physician has recommended that you have lab work today: 1 week before  If you have labs (blood work) drawn today and your tests are completely normal, you will receive your results via MyChart message OR a phone call from our staff.  Please ensure you check your voicemail in the event that you authorized detailed messages to be left on a delegated number. If you have any lab test that is abnormal or we need to change your treatment, we will call you to review the results.   Follow-Up: Your next appointment:   Your physician recommends that you schedule a follow-up appointment in: 7 week follow up  with Dr. de Peru  You will receive a text message or e-mail with a link to a survey about your care and experience with [place] today! We would greatly appreciate your feedback!   Thanks for letting us be apart of your health journey!!  Primary Care and Sports Medicine   Dr. Ceasar Mons Peru   We encourage you to activate your patient portal called "MyChart".  Sign up information is provided on this After Visit Summary.  MyChart is used to connect with patients for Virtual Visits (Telemedicine).  Patients are able to view lab/test results, encounter notes, upcoming appointments, etc.  Non-urgent messages can be sent to your provider as well. To learn more about what you can do with MyChart, please visit --  [email or web address].

## 2023-07-19 NOTE — Assessment & Plan Note (Signed)
Patient with prior notable increase in hemoglobin A1c to 12.3%, was at 8.4%.  He continues with metformin and glipizide which was started at last visit as well as lifestyle modifications.  We did discuss notable increase in A1c and need for better control of blood sugars.  He is reports prior polyuria and polydipsia has notably improved. We will plan for follow-up in about 6 weeks with A1c check 1 week prior. We also discussed possibility of injectable medication options. Would be open to injectable - would not be able to inject himself, but open to coming to office for nurse visit for this.  Will plan for close follow-up in about 6 weeks Foot exam completed today

## 2023-07-19 NOTE — Assessment & Plan Note (Signed)
Blood pressure borderline in office today.  Recommend continuing current medications, no changes to be made today Recommend intermittent monitoring of blood pressure at home, DASH diet

## 2023-08-02 ENCOUNTER — Other Ambulatory Visit (HOSPITAL_BASED_OUTPATIENT_CLINIC_OR_DEPARTMENT_OTHER): Payer: Self-pay | Admitting: Family Medicine

## 2023-08-02 DIAGNOSIS — E1143 Type 2 diabetes mellitus with diabetic autonomic (poly)neuropathy: Secondary | ICD-10-CM

## 2023-08-06 ENCOUNTER — Encounter: Payer: Self-pay | Admitting: Podiatry

## 2023-08-06 ENCOUNTER — Ambulatory Visit: Payer: PPO | Admitting: Podiatry

## 2023-08-06 DIAGNOSIS — M2041 Other hammer toe(s) (acquired), right foot: Secondary | ICD-10-CM

## 2023-08-06 DIAGNOSIS — M722 Plantar fascial fibromatosis: Secondary | ICD-10-CM

## 2023-08-07 ENCOUNTER — Other Ambulatory Visit: Payer: Self-pay

## 2023-08-07 ENCOUNTER — Telehealth: Payer: Self-pay | Admitting: Podiatry

## 2023-08-07 DIAGNOSIS — I70212 Atherosclerosis of native arteries of extremities with intermittent claudication, left leg: Secondary | ICD-10-CM

## 2023-08-07 NOTE — Progress Notes (Signed)
Subjective:   Patient ID: Jeff Lopez, male   DOB: 73 y.o.   MRN: [id number]   HPI Patient states that the left arch has been hurting in the heel that I had worked on seems better with patient found to have still severe pain in the third toe right stating trimming did not help and he wants a more definitive long-term treatment for this   ROS      Objective:  Physical Exam  Neurovascular status intact good digital perfusion noted pain left heel seems better but the arch is sore and patient's third digit right is long and has a distal contracture with keratotic lesion and we tried trimming he has done padding lifting of the toe and shoe gear modifications     Assessment:  Chronic hammertoe deformity digit 3 right with severe distal pain and fascial inflammation left arch     Plan:  H&P reviewed both conditions.  Due to the failure to respond to trimming padding and shoe gear modifications digital shortening along with distal lifting of the toe has been discussed.  I do think this would be best for the patient long-term patient wants to do surgery and understands risk.  I allowed him to read consent form going over alternative treatments complications associated with surgery and he is willing to accept risk and after review signed consent form understanding that we Argun to try to take all distal pressure but the toe could still have chronic skin condition which may get irritated. Patient will have pin for 5 weeks total recovery.  About 4 to 6 months and I will inject the left arch at the same time all questions answered at this time

## 2023-08-07 NOTE — Telephone Encounter (Signed)
DOS-08/21/23  [facility] REPAIR 3,4 RIGHT- [id number]  [employer] EFFECTIVE DATE-06/27/23  SPOKE WITH HAYLEY M FROM [employer] AND SHE STATED THAT PRIOR AUTH IS NOT REQUIRED FOR  CPT CODE 13086.  CALL REF #: Roseanna Rainbow 08/07/23 @10 :04 AM EST

## 2023-08-15 ENCOUNTER — Telehealth: Payer: Self-pay

## 2023-08-15 NOTE — Telephone Encounter (Signed)
Medication: Plavix -Per Dr. Randie Heinz - ok to hold Plavix for 5 days for foot surgery on 08/21/23

## 2023-08-20 MED ORDER — HYDROCODONE-ACETAMINOPHEN 10-325 MG PO TABS: 1.0000 | ORAL_TABLET | Freq: Three times a day (TID) | ORAL | 0 refills | Status: AC | PRN

## 2023-08-20 NOTE — Addendum Note (Signed)
 Addended by: Lenn Sink on: 08/20/2023 02:19 PM   Modules accepted: Orders

## 2023-08-21 ENCOUNTER — Other Ambulatory Visit (HOSPITAL_BASED_OUTPATIENT_CLINIC_OR_DEPARTMENT_OTHER): Payer: Self-pay | Admitting: Family Medicine

## 2023-08-21 DIAGNOSIS — E1143 Type 2 diabetes mellitus with diabetic autonomic (poly)neuropathy: Secondary | ICD-10-CM

## 2023-08-21 DIAGNOSIS — M2041 Other hammer toe(s) (acquired), right foot: Secondary | ICD-10-CM | POA: Diagnosis not present

## 2023-08-22 ENCOUNTER — Other Ambulatory Visit: Payer: Self-pay

## 2023-08-22 ENCOUNTER — Emergency Department (HOSPITAL_BASED_OUTPATIENT_CLINIC_OR_DEPARTMENT_OTHER)
Admission: EM | Admit: 2023-08-22 | Discharge: 2023-08-22 | Disposition: A | Discharge status: Home / Self Care | Payer: PPO | Attending: Emergency Medicine | Admitting: Emergency Medicine

## 2023-08-22 DIAGNOSIS — Z7982 Long term (current) use of aspirin: Secondary | ICD-10-CM | POA: Diagnosis not present

## 2023-08-22 DIAGNOSIS — Z7984 Long term (current) use of oral hypoglycemic drugs: Secondary | ICD-10-CM | POA: Diagnosis not present

## 2023-08-22 DIAGNOSIS — Z79899 Other long term (current) drug therapy: Secondary | ICD-10-CM | POA: Insufficient documentation

## 2023-08-22 DIAGNOSIS — I9762 Postprocedural hemorrhage of a circulatory system organ or structure following other procedure: Secondary | ICD-10-CM | POA: Insufficient documentation

## 2023-08-22 NOTE — ED Provider Notes (Signed)
 [place] EMERGENCY DEPARTMENT AT [facility] Provider Note   CSN: [id number] Arrival date & time: 08/22/23  4098     History  Chief Complaint  Patient presents with   Post-op Problem    Jeff Lopez is a 73 y.o. male.  HPI 73 year old male presents with postoperative bleeding.  Patient had surgery by Dr. Charlsie Merles yesterday on his right foot.  A few hours later after he woke up from a nap he noticed that his wound was bleeding and he tried to control with gauze and other dressings.  This morning it seems like it might of stopped though the bandages soaked.  He called the surgical center who told him to come to the [facility] as they did not have anyone available.  While he has been waiting to be seen, his surgeon called him personally and told him to come to the office so he would prefer to be discharged.  Home Medications Prior to Admission medications   Medication Sig Start Date End Date Taking? Authorizing Provider  amLODipine-olmesartan (AZOR) 5-40 MG tablet TAKE 1 TABLET BY MOUTH ONCE DAILY 06/26/23   [place], Buren Kos, MD  aspirin EC 81 MG tablet Take 81 mg by mouth daily. Swallow whole.    [provider]  clopidogrel (PLAVIX) 75 MG tablet TAKE 1 TABLET BY MOUTH EVERY DAY 04/24/23   Maeola Harman, MD  eszopiclone (LUNESTA) 2 MG TABS tablet TAKE 1 TABLET BY MOUTH AT BEDTIME 07/23/23   [place], Buren Kos, MD  glipiZIDE (GLUCOTROL XL) 5 MG 24 hr tablet TAKE 1 TABLET BY MOUTH EVERY DAY WITH BREAKFAST 08/22/23   [place], Buren Kos, MD  HYDROcodone-acetaminophen (NORCO) 10-325 MG tablet Take 1 tablet by mouth every 8 (eight) hours as needed for up to 5 days. 08/20/23 08/25/23  Lenn Sink, DPM  levothyroxine (SYNTHROID) 137 MCG tablet TAKE 1 TABLET BY MOUTH EVERY DAY BEFORE BREAKFAST 04/24/23   de [place], Buren Kos, MD  metFORMIN (GLUCOPHAGE-XR) 500 MG 24 hr tablet Take 2 tablets (1,000 mg total) by mouth daily with breakfast. Patient taking  differently: Take 1,000 mg by mouth every other day. Every other day due to diarrhea 03/02/23   de [place], Buren Kos, MD  metoprolol succinate (TOPROL-XL) 50 MG 24 hr tablet TAKE 1 TABLET BY MOUTH EVERY DAY WITH OR IMMEDIATELY FOLLOWING A MEAL 09/27/22   Tonny Bollman, MD  pantoprazole (PROTONIX) 40 MG tablet TAKE 1 TABLET BY MOUTH 2 TIMES DAILY BEFORE A MEAL Patient taking differently: Take 40 mg by mouth 2 (two) times daily. 09/08/22   Hilarie Fredrickson, MD  rosuvastatin (CRESTOR) 20 MG tablet TAKE 1 TABLET BY MOUTH EVERY DAY 05/29/23   de [place], Raymond J, MD      Allergies    Ceftin, Cefuroxime axetil, and Cephalexin    Review of Systems   Review of Systems  Skin:  Positive for wound.    Physical Exam Updated Vital Signs BP (!) 164/85 (BP Location: Right Arm)   Pulse 81   Temp 98.1 F (36.7 C)   Resp 17   SpO2 93%  Physical Exam Vitals and nursing note reviewed.  Constitutional:      Appearance: He is well-developed.  HENT:     Head: Normocephalic and atraumatic.  Cardiovascular:     Rate and Rhythm: Normal rate and regular rhythm.     Pulses:          Dorsalis pedis pulses are 2+ on the right  side.  Pulmonary:     Effort: Pulmonary effort is normal.  Abdominal:     Palpations: Abdomen is soft.  Musculoskeletal:     Comments: Right foot has a plantar bandage and is in a postoperative shoe.  The foot is warm.  He has bloodsoaked gauze but no apparent active bleeding.  Skin:    General: Skin is warm and dry.  Neurological:     Mental Status: He is alert.     ED Results / Procedures / Treatments   Labs (all labs ordered are listed, but only abnormal results are displayed) Labs Reviewed - No data to display  EKG None  Radiology No results found.  Procedures Procedures    Medications Ordered in ED Medications - No data to display  ED Course/ Medical Decision Making/ A&P                                 Medical Decision Making  Patient is well-appearing.  He is  a little hypertensive but not actively bleeding and is not having any signs or symptoms of significant blood loss at this time.  He prefers to go straight to his podiatrist office after being called by his podiatrist and so he will be discharged and allowed to go there for better postoperative care.        Final Clinical Impression(s) / ED Diagnoses Final diagnoses:  Postoperative hemorrhage involving circulatory system following non-circulatory system procedure    Rx / DC Orders ED Discharge Orders     None         Pricilla Loveless, MD 08/22/23 [phone]

## 2023-08-22 NOTE — ED Notes (Signed)
 Patient received call from his surgeon Dr. Charlsie Merles and was instructed to return to his office immediately. Dr. Criss Alvine notified and in to see patient. Will discharge patient to surgeon's office.

## 2023-08-22 NOTE — ED Triage Notes (Signed)
 Pt caox4 reporting he had outpatient surgery on R foot yesterday and that he had started bleeding from foot last night and that it has continued to bleed throughout the night. Pt stopped plavix 5 days prior to surgery and has not taken it since d/t being told not to take until 5 days post procedure.

## 2023-08-22 NOTE — Discharge Instructions (Signed)
 Go straight to Dr. Beverlee Nims office. Do not eat or drink prior to going there. If you develop dizziness, lightheadedness, or any other new/concerning symptoms then stop and call 911.

## 2023-08-27 ENCOUNTER — Encounter: Payer: Self-pay | Admitting: Podiatry

## 2023-08-27 ENCOUNTER — Ambulatory Visit (INDEPENDENT_AMBULATORY_CARE_PROVIDER_SITE_OTHER): Payer: PPO | Admitting: Podiatry

## 2023-08-27 ENCOUNTER — Ambulatory Visit (INDEPENDENT_AMBULATORY_CARE_PROVIDER_SITE_OTHER)

## 2023-08-27 VITALS — Ht 70.0 in | Wt 205.0 lb

## 2023-08-27 DIAGNOSIS — M2041 Other hammer toe(s) (acquired), right foot: Secondary | ICD-10-CM | POA: Diagnosis not present

## 2023-08-29 NOTE — Progress Notes (Signed)
 Subjective:   Patient ID: Jeff Lopez, male   DOB: 73 y.o.   MRN: [id number]   HPI Patient states doing very well with surgery stating that he has had minimal pain and he did not believe at all anymore   ROS      Objective:  Physical Exam  Neuro vascular status intact negative Denna Haggard' sign noted wound edges coapted well with some crusted blood third digit with pin intact third toe excellent alignment     Assessment:  Alignment excellent with some crusted blood secondary some early bleeding but overall stable     Plan:  H&P x-ray reviewed reapplied sterile dressing instructed on keeping the toe bandage down and to continue elevation compression immobilization reappoint 2 weeks suture removal  X-rays indicate pin is intact good alignment noted of the digit

## 2023-08-30 ENCOUNTER — Other Ambulatory Visit (HOSPITAL_BASED_OUTPATIENT_CLINIC_OR_DEPARTMENT_OTHER): Payer: PPO

## 2023-08-30 ENCOUNTER — Ambulatory Visit (HOSPITAL_BASED_OUTPATIENT_CLINIC_OR_DEPARTMENT_OTHER): Payer: PPO | Admitting: Family Medicine

## 2023-08-30 DIAGNOSIS — E1143 Type 2 diabetes mellitus with diabetic autonomic (poly)neuropathy: Secondary | ICD-10-CM

## 2023-08-30 LAB — HEMOGLOBIN A1C
Est. average glucose Bld gHb Est-mCnc: 263 mg/dL
Hgb A1c MFr Bld: 10.8 % — ABNORMAL HIGH (ref 4.8–5.6)

## 2023-09-06 ENCOUNTER — Encounter (HOSPITAL_BASED_OUTPATIENT_CLINIC_OR_DEPARTMENT_OTHER): Payer: Self-pay | Admitting: Family Medicine

## 2023-09-06 ENCOUNTER — Ambulatory Visit (HOSPITAL_BASED_OUTPATIENT_CLINIC_OR_DEPARTMENT_OTHER): Payer: PPO | Admitting: Family Medicine

## 2023-09-06 VITALS — BP 128/77 | HR 65 | Ht 70.0 in | Wt 206.0 lb

## 2023-09-06 DIAGNOSIS — Z7984 Long term (current) use of oral hypoglycemic drugs: Secondary | ICD-10-CM

## 2023-09-06 DIAGNOSIS — E1143 Type 2 diabetes mellitus with diabetic autonomic (poly)neuropathy: Secondary | ICD-10-CM

## 2023-09-06 DIAGNOSIS — I10 Essential (primary) hypertension: Secondary | ICD-10-CM | POA: Diagnosis not present

## 2023-09-06 MED ORDER — GLIPIZIDE ER 10 MG PO TB24: 10.0000 mg | ORAL_TABLET | Freq: Every day | ORAL | 1 refills | Status: DC

## 2023-09-06 NOTE — Patient Instructions (Signed)
  Medication Instructions:  Your physician recommends that you continue on your current medications as directed. Please refer to the Current Medication list given to you today. --If you need a refill on any your medications before your next appointment, please call your pharmacy first. If no refills are authorized on file call the office.-- Lab Work: Your physician has recommended that you have lab work today: 1 week before next visit  If you have labs (blood work) drawn today and your tests are completely normal, you will receive your results via MyChart message OR a phone call from our staff.  Please ensure you check your voicemail in the event that you authorized detailed messages to be left on a delegated number. If you have any lab test that is abnormal or we need to change your treatment, we will call you to review the results.   Follow-Up: Your next appointment:   Your physician recommends that you schedule a follow-up appointment in: 3 month follow up with Dr. de [place]  You will receive a text message or e-mail with a link to a survey about your care and experience with [place] today! We would greatly appreciate your feedback!   Thanks for letting us be apart of your health journey!!  Primary Care and Sports Medicine   Dr. Ceasar Mons [place]   We encourage you to activate your patient portal called "MyChart".  Sign up information is provided on this After Visit Summary.  MyChart is used to connect with patients for Virtual Visits (Telemedicine).  Patients are able to view lab/test results, encounter notes, upcoming appointments, etc.  Non-urgent messages can be sent to your provider as well. To learn more about what you can do with MyChart, please visit --  [email or web address].

## 2023-09-06 NOTE — Assessment & Plan Note (Addendum)
 Patient with some improvement in hemoglobin A1c, but still above goal at 10.8%.  He is frustrated with mild degree of improvement. He continues with metformin and glipizide which was started at last visit as well as lifestyle modifications.   We reviewed options today including potential for medication adjustments.  He continues to be hesitant regarding injectable medications, indicating that he would be fine with receiving them, however would not be able to administer them himself.  He is wanting to be on less medications, despite need for further pharmacotherapy given continued elevated A1c. After discussion, patient was amenable to adjusting dose of glipizide at this time.  We also discussed possible SGLT2 inhibitor given elevated A1c as well as moderately increased albuminuria.  Patient declines utilizing SGLT2 inhibitor We will plan for follow-up in about 3 months with A1c check 1 week prior.  Will plan for close follow-up in about 3 months

## 2023-09-06 NOTE — Assessment & Plan Note (Signed)
 Blood pressure controlled in office today.  Recommend continuing current medications, no changes to be made today Recommend intermittent monitoring of blood pressure at home, DASH diet

## 2023-09-06 NOTE — Progress Notes (Signed)
    Procedures performed today:    None.  Independent interpretation of notes and tests performed by another provider:   None.  Brief History, Exam, Impression, and Recommendations:    BP 128/77 (BP Location: Left Arm, Patient Position: Sitting, Cuff Size: Large)   Pulse 65   Ht 5\' 10"  (1.778 m)   Wt 206 lb (93.4 kg)   SpO2 96%   BMI 29.56 kg/m   Primary hypertension Assessment & Plan: Blood pressure controlled in office today.  Recommend continuing current medications, no changes to be made today Recommend intermittent monitoring of blood pressure at home, DASH diet   Type II diabetes mellitus with peripheral autonomic neuropathy (HCC) Assessment & Plan: Patient with some improvement in hemoglobin A1c, but still above goal at 10.8%.  He is frustrated with mild degree of improvement. He continues with metformin and glipizide which was started at last visit as well as lifestyle modifications.   We reviewed options today including potential for medication adjustments.  He continues to be hesitant regarding injectable medications, indicating that he would be fine with receiving them, however would not be able to administer them himself.  He is wanting to be on less medications, despite need for further pharmacotherapy given continued elevated A1c. After discussion, patient was amenable to adjusting dose of glipizide at this time.  We also discussed possible SGLT2 inhibitor given elevated A1c as well as moderately increased albuminuria.  Patient declines utilizing SGLT2 inhibitor We will plan for follow-up in about 3 months with A1c check 1 week prior.  Will plan for close follow-up in about 3 months  Orders: -     glipiZIDE ER; Take 1 tablet (10 mg total) by mouth daily with breakfast.  Dispense: 90 tablet; Refill: 1 -     Hemoglobin A1c; Future  Return in about 3 months (around 12/07/2023) for diabetes, hypertension, labs 1 week prior.  Spent 32 minutes on this patient encounter,  including preparation, chart review, face-to-face counseling with patient and coordination of care, and documentation of encounter   ___________________________________________ Florence Yeung de [place], MD, ABFM, [facility] Primary Care and Sports Medicine [facility]

## 2023-09-10 ENCOUNTER — Ambulatory Visit (INDEPENDENT_AMBULATORY_CARE_PROVIDER_SITE_OTHER)

## 2023-09-10 ENCOUNTER — Ambulatory Visit (INDEPENDENT_AMBULATORY_CARE_PROVIDER_SITE_OTHER): Payer: PPO

## 2023-09-10 DIAGNOSIS — M2041 Other hammer toe(s) (acquired), right foot: Secondary | ICD-10-CM | POA: Diagnosis not present

## 2023-09-10 NOTE — Progress Notes (Signed)
 Patient in status post op DOS 08/21/23 --- FUSION DIGIT 3RD RIGHT WITH PIN, DISTAL ARTHROPLASTY DIGITS 3,4 RIGHT, INJECTION LEFT ARCH . Pt shows no sign of infection at incision site. Denies fever or chills. He states that he does have stinging at pin site. Instructed pt to take tylenol or ibuprofen before pain gets intense.

## 2023-09-18 ENCOUNTER — Encounter (HOSPITAL_BASED_OUTPATIENT_CLINIC_OR_DEPARTMENT_OTHER): Payer: Self-pay

## 2023-09-18 ENCOUNTER — Ambulatory Visit (HOSPITAL_BASED_OUTPATIENT_CLINIC_OR_DEPARTMENT_OTHER): Admitting: *Deleted

## 2023-09-18 DIAGNOSIS — Z0000 Encounter for general adult medical examination without abnormal findings: Secondary | ICD-10-CM | POA: Diagnosis not present

## 2023-09-18 NOTE — Progress Notes (Signed)
 Subjective:   Jeff Lopez is a 73 y.o. male who presents for Medicare Annual/Subsequent preventive examination.  Visit Complete: Virtual I connected with  Jeff Lopez on 09/18/23 by a audio enabled telemedicine application and verified that I am speaking with the correct person using two identifiers.  Patient Location: Home  Provider Location: Home Office  I discussed the limitations of evaluation and management by telemedicine. The patient expressed understanding and agreed to proceed.  Vital Signs: Because this visit was a virtual/telehealth visit, some criteria may be missing or patient reported. Any vitals not documented were not able to be obtained and vitals that have been documented are patient reported.       Objective:    Today's Vitals   09/18/23 1434  PainSc: 2    There is no height or weight on file to calculate BMI.     09/18/2023    2:35 PM 09/26/2022   11:39 AM 07/03/2022    7:04 AM 10/20/2021    9:00 PM 06/08/2021    9:14 AM 05/28/2020    9:26 AM 04/26/2018    9:18 AM  Advanced Directives  Does Patient Have a Medical Advance Directive? No Yes Yes No Yes Yes Yes  Type of Furniture conservator/restorer;Living will Healthcare Power of [place];Living will  Living will Living will;Healthcare Power of Attorney   Does patient want to make changes to medical advance directive?   No - Patient declined  No - Patient declined No - Patient declined   Copy of Healthcare Power of Attorney in Chart?  No - copy requested No - copy requested   No - copy requested   Would patient like information on creating a medical advance directive?    No - Patient declined       Current Medications (verified) Outpatient Encounter Medications as of 09/18/2023  Medication Sig   amLODipine-olmesartan (AZOR) 5-40 MG tablet TAKE 1 TABLET BY MOUTH ONCE DAILY   aspirin EC 81 MG tablet Take 81 mg by mouth daily. Swallow whole.   clopidogrel (PLAVIX) 75 MG tablet TAKE  1 TABLET BY MOUTH EVERY DAY   eszopiclone (LUNESTA) 2 MG TABS tablet TAKE 1 TABLET BY MOUTH AT BEDTIME   glipiZIDE (GLUCOTROL XL) 10 MG 24 hr tablet Take 1 tablet (10 mg total) by mouth daily with breakfast.   levothyroxine (SYNTHROID) 137 MCG tablet TAKE 1 TABLET BY MOUTH EVERY DAY BEFORE BREAKFAST   metFORMIN (GLUCOPHAGE-XR) 500 MG 24 hr tablet Take 2 tablets (1,000 mg total) by mouth daily with breakfast. (Patient taking differently: Take 1,000 mg by mouth every other day. Every other day due to diarrhea)   metoprolol succinate (TOPROL-XL) 50 MG 24 hr tablet TAKE 1 TABLET BY MOUTH EVERY DAY WITH OR IMMEDIATELY FOLLOWING A MEAL   pantoprazole (PROTONIX) 40 MG tablet TAKE 1 TABLET BY MOUTH 2 TIMES DAILY BEFORE A MEAL (Patient taking differently: Take 40 mg by mouth 2 (two) times daily.)   rosuvastatin (CRESTOR) 20 MG tablet TAKE 1 TABLET BY MOUTH EVERY DAY   No facility-administered encounter medications on file as of 09/18/2023.    Allergies (verified) Ceftin, Cefuroxime axetil, and Cephalexin   History: Past Medical History:  Diagnosis Date   Allergy    per pt   CAD, NATIVE VESSEL 04/19/2010   nonobstructive by cath 2007:  oLAD 20-30%, mLAD 50%, pCFX 20-30%, oAVCFX 20-30%, L renal art 50%;  normal LVF   Cataract    bil cataracts removed  COPD (chronic obstructive pulmonary disease) (HCC)    Diabetes mellitus without complication (HCC)    GERD 04/09/2007   Headache(784.0)    HYPERLIPIDEMIA 04/09/2007   Patient denies.   HYPERTENSION, BENIGN 04/19/2010   Hypothyroidism    Hypothyroidism    previous hyperthyroidism, s/p I-131   LUMBAR DISC DISORDER 05/27/2010   Nerve damage    Left leg   OSTEOARTHRITIS, LUMBAR SPINE 04/09/2007   RENAL CYST 05/27/2010   Spinal headache    After having back surgery in 2014   Past Surgical History:  Procedure Laterality Date   ABDOMINAL AORTOGRAM W/LOWER EXTREMITY N/A 07/03/2022   Procedure: ABDOMINAL AORTOGRAM W/LOWER EXTREMITY;  Surgeon: Maeola Harman, MD;  Location: [facility] INVASIVE CV LAB;  Service: Cardiovascular;  Laterality: N/A;   ABDOMINAL AORTOGRAM W/LOWER EXTREMITY N/A 12/18/2022   Procedure: ABDOMINAL AORTOGRAM W/LOWER EXTREMITY;  Surgeon: Maeola Harman, MD;  Location: [facility] INVASIVE CV LAB;  Service: Cardiovascular;  Laterality: N/A;   BACK SURGERY  2012,2014   CARDIAC CATHETERIZATION  2007   Dr Everardo All   CHOLECYSTECTOMY     CHOLECYSTECTOMY N/A 07/13/2013   Procedure: LAPAROSCOPIC CHOLECYSTECTOMY WITH INTRAOPERATIVE CHOLANGIOGRAM;  Surgeon: Kandis Cocking, MD;  Location: [facility] ORS;  Service: General;  Laterality: N/A;   COLONOSCOPY  2018   COLONOSCOPY W/ POLYPECTOMY     ERCP N/A 07/14/2013   Procedure: ENDOSCOPIC RETROGRADE CHOLANGIOPANCREATOGRAPHY (ERCP);  Surgeon: Rachael Fee, MD;  Location: [facility]COPY;  Service: Endoscopy;  Laterality: N/A;   EYE SURGERY Bilateral    Cataract removal   FOOT FRACTURE SURGERY     FRACTURE SURGERY     LUMBAR DISC SURGERY  08/06/2012   L3 & L4   LUMBAR DISC SURGERY  11/11/2015   L1 L2    DR NITKA   LUMBAR FUSION N/A 04/20/2015   Procedure: T12 to L1 fusion (Extension of Previous Fusion L2-S1 to T12-S1), Right Transforaminal lumbar interbody fusion, Posterior Fusion T12 to L1, with Pedicle screws, allograft, local bone graft, and  Vivigen;  Surgeon: Kerrin Champagne, MD;  Location: [facility] OR;  Service: Orthopedics;  Laterality: N/A;   LUMBAR LAMINECTOMY N/A 11/10/2013   Procedure: Left L1-2 far lateral approach to excise herniated nucleus pulposus;  Surgeon: Kerrin Champagne, MD;  Location: [facility] OR;  Service: Orthopedics;  Laterality: N/A;   LUMBAR LAMINECTOMY/DECOMPRESSION MICRODISCECTOMY Left 08/10/2012   Procedure: Dura Repair Left Side L2-L3;  Surgeon: Kerrin Champagne, MD;  Location: [facility] OR;  Service: Orthopedics;  Laterality: Left;  Wilson Frame, Sliding table, dura repair kit, microscope   NECK SURGERY     X 2   PERIPHERAL VASCULAR INTERVENTION Left 07/03/2022   Procedure:  PERIPHERAL VASCULAR INTERVENTION;  Surgeon: Maeola Harman, MD;  Location: [facility] INVASIVE CV LAB;  Service: Cardiovascular;  Laterality: Left;  fem-pop   PERIPHERAL VASCULAR INTERVENTION  12/18/2022   Procedure: PERIPHERAL VASCULAR INTERVENTION;  Surgeon: Maeola Harman, MD;  Location: [facility] INVASIVE CV LAB;  Service: Cardiovascular;;   SINOSCOPY     SPINE SURGERY  2006   C-spine surgery x 2   UPPER GASTROINTESTINAL ENDOSCOPY     Family History  Problem Relation Age of Onset   Breast cancer Mother    Uterine cancer Mother    Heart disease Mother    Heart attack Mother 58   Prostate cancer Father    Hypertension Brother    Non-Hodgkin's lymphoma Brother    Stomach cancer Paternal Grandmother    Thyroid disease Neg Hx  Colon cancer Neg Hx    Esophageal cancer Neg Hx    Rectal cancer Neg Hx    Pancreatic cancer Neg Hx    Colon polyps Neg Hx    Social History   Socioeconomic History   Marital status: Single    Spouse name: Not on file   Number of children: 1   Years of education: Not on file   Highest education level: Not on file  Occupational History   Occupation: DISTRIBUTION MGR    Employer: [employer]  Tobacco Use   Smoking status: Former    Current packs/day: 0.00    Average packs/day: 1 pack/day for 37.0 years (37.0 ttl pk-yrs)    Types: Cigarettes    Start date: 04/07/1971    Quit date: 04/06/2008    Years since quitting: 15.4    Passive exposure: Past   Smokeless tobacco: Never   Tobacco comments:    pt has stopped smoking about 9 months now  Vaping Use   Vaping status: Never Used  Substance and Sexual Activity   Alcohol use: No   Drug use: No   Sexual activity: Not Currently  Other Topics Concern   Not on file  Social History Narrative   Works in Set designer. Retired.   Lives alone, has one child in [place].    Social Drivers of Corporate investment banker Strain: Low Risk  (09/18/2023)   Overall Financial Resource  Strain (CARDIA)    Difficulty of Paying Living Expenses: Not hard at all  Food Insecurity: No Food Insecurity (09/18/2023)   Hunger Vital Sign    Worried About Running Out of Food in the Last Year: Never true    Ran Out of Food in the Last Year: Never true  Transportation Needs: No Transportation Needs (09/18/2023)   PRAPARE - Administrator, Civil Service (Medical): No    Lack of Transportation (Non-Medical): No  Physical Activity: Sufficiently Active (09/18/2023)   Exercise Vital Sign    Days of Exercise per Week: 5 days    Minutes of Exercise per Session: 30 min  Stress: No Stress Concern Present (09/18/2023)   [employer] of Occupational Health - Occupational Stress Questionnaire    Feeling of Stress : Not at all  Social Connections: Socially Isolated (09/18/2023)   Social Connection and Isolation Panel [NHANES]    Frequency of Communication with Friends and Family: More than three times a week    Frequency of Social Gatherings with Friends and Family: More than three times a week    Attends Religious Services: Never    Database administrator or Organizations: No    Attends Engineer, structural: Never    Marital Status: Never married    Tobacco Counseling Counseling given: Not Answered Tobacco comments: pt has stopped smoking about 9 months now   Clinical Intake:  Pre-visit preparation completed: Yes  Pain : 0-10 Pain Score: 2  Pain Location: Foot Pain Orientation: Right Pain Descriptors / Indicators: Aching, Burning Pain Onset: 1 to 4 weeks ago Pain Frequency: Intermittent     Diabetes: Yes CBG done?: No Did pt. bring in CBG monitor from home?: No  How often do you need to have someone help you when you read instructions, pamphlets, or other written materials from your doctor or pharmacy?: 1 - Never  Interpreter Needed?: No  Information entered by :: Remi Haggard LPN   Activities of Daily Living    09/18/2023    2:39 PM 12/18/2022  2:56 PM  In your present state of health, do you have any difficulty performing the following activities:  Hearing? 0 0  Vision? 0 0  Difficulty concentrating or making decisions? 0 0  Walking or climbing stairs? 0 0  Dressing or bathing? 0 0  Doing errands, shopping? 0 0  Preparing Food and eating ? N   Using the Toilet? N   In the past six months, have you accidently leaked urine? N   Do you have problems with loss of bowel control? N   Managing your Medications? N   Managing your Finances? N   Housekeeping or managing your Housekeeping? N     Patient Care Team: [place], Buren Kos, MD as PCP - General (Family Medicine) Tonny Bollman, MD as PCP - Cardiology (Cardiology) Ovidio Kin, MD as Consulting Physician (General Surgery) Lutricia Feil, [facility] as Pharmacist (Pharmacist)  Indicate any recent Medical Services you may have received from other than [facility] providers in the past year (date may be approximate).     Assessment:   This is a routine wellness examination for Ory.  Hearing/Vision screen Hearing Screening - Comments:: No trouble hearing Vision Screening - Comments:: [facility] Up to date   Goals Addressed             This Visit's Progress    Patient Stated       Stay in alive       Depression Screen    09/18/2023    2:38 PM 09/06/2023    8:39 AM 07/19/2023    9:25 AM 06/08/2023    9:18 AM 05/29/2023    8:47 AM 03/02/2023    9:38 AM 11/30/2022   10:08 AM  PHQ 2/9 Scores  PHQ - 2 Score 1 0 0 0 0 0 1  PHQ- 9 Score 7 0 0 0 0 5 6  Exception Documentation       Medical reason    Fall Risk    09/18/2023    2:34 PM 09/06/2023    8:39 AM 07/19/2023    9:25 AM 06/08/2023    9:18 AM 05/29/2023    8:47 AM  Fall Risk   Falls in the past year? 0 0 0 0 0  Number falls in past yr: 0 0 0 0 0  Injury with Fall? 0 0 0 0 0  Risk for fall due to :  No Fall Risks No Fall Risks No Fall Risks No Fall Risks  Follow up Falls evaluation  completed;Education provided;Falls prevention discussed Falls evaluation completed  Falls evaluation completed Falls evaluation completed    MEDICARE RISK AT HOME: Medicare Risk at Home Any stairs in or around the home?: No If so, are there any without handrails?: No Home free of loose throw rugs in walkways, pet beds, electrical cords, etc?: Yes Adequate lighting in your home to reduce risk of falls?: Yes Life alert?: No Use of a cane, walker or w/c?: No Grab bars in the bathroom?: Yes Shower chair or bench in shower?: No Elevated toilet seat or a handicapped toilet?: No  TIMED UP AND GO:  Was the test performed?  No    Cognitive Function:        09/18/2023    2:36 PM 09/26/2022   11:39 AM 06/08/2021    9:14 AM  6CIT Screen  What Year? 0 points 0 points 0 points  What month? 0 points 0 points 0 points  What time? 0 points 0 points 0 points  Count back from 20 0 points 0 points 0 points  Months in reverse 0 points 0 points 0 points  Repeat phrase 0 points 0 points 0 points  Total Score 0 points 0 points 0 points    Immunizations Immunization History  Administered Date(s) Administered   Fluad Quad(high Dose 65+) 04/08/2019, 03/09/2021, 05/29/2022   Fluad Trivalent(High Dose 65+) 03/02/2023   Influenza Split 03/16/2011, 04/12/2012   Influenza Whole 04/23/2010   Influenza, High Dose Seasonal PF 02/15/2015   Influenza,inj,Quad PF,6+ Mos 03/19/2014, 04/04/2018   Influenza-Unspecified 04/08/2013, 04/13/2019, 04/16/2020   PFIZER(Purple Top)SARS-COV-2 Vaccination 09/24/2019, 10/09/2019, 04/16/2020, 05/18/2021   Pneumococcal Conjugate-13 02/12/2014   Pneumococcal Polysaccharide-23 05/27/2010, 04/29/2018   Td 01/24/2006   Tdap 02/28/2018   Varicella 05/10/2012   Zoster Recombinant(Shingrix) 01/29/2017, 03/08/2017    TDAP status: Up to date  Flu Vaccine status: Up to date  Pneumococcal vaccine status: Up to date  Covid-19 vaccine status: Information provided on how to  obtain vaccines.   Qualifies for Shingles Vaccine? No   Zostavax completed Yes   Shingrix Completed?: Yes  Screening Tests Health Maintenance  Topic Date Due   COVID-19 Vaccine (5 - 2024-25 season) 02/25/2023   OPHTHALMOLOGY EXAM  12/14/2023   Diabetic kidney evaluation - eGFR measurement  02/14/2024   HEMOGLOBIN A1C  03/01/2024   Diabetic kidney evaluation - Urine ACR  05/31/2024   FOOT EXAM  07/18/2024   Medicare Annual Wellness (AWV)  09/17/2024   Colonoscopy  10/29/2026   DTaP/Tdap/Td (3 - Td or Tdap) 02/29/2028   Pneumonia Vaccine 97+ Years old  Completed   INFLUENZA VACCINE  Completed   Hepatitis C Screening  Completed   Zoster Vaccines- Shingrix  Completed   HPV VACCINES  Aged Out   Lung Cancer Screening  Discontinued    Health Maintenance  Health Maintenance Due  Topic Date Due   COVID-19 Vaccine (5 - 2024-25 season) 02/25/2023    Colorectal cancer screening: Type of screening: Colonoscopy. Completed 2021. Repeat every 7 years  Lung Cancer Screening: (Low Dose CT Chest recommended if Age 40-80 years, 20 pack-year currently smoking OR have quit w/in 15years.)  2024   Lung Cancer Screening Referral:   Additional Screening:  Hepatitis C Screening: does not qualify; Completed 2017  Vision Screening: Recommended annual ophthalmology exams for early detection of glaucoma and other disorders of the eye. Is the patient up to date with their annual eye exam?  Yes  Who is the provider or what is the name of the office in which the patient attends annual eye exams? [facility] If pt is not established with a provider, would they like to be referred to a provider to establish care? No .   Dental Screening: Recommended annual dental exams for proper oral hygiene  Nutrition Risk Assessment:  Has the patient had any N/V/D within the last 2 months?  No  Does the patient have any non-healing wounds?  No  Has the patient had any unintentional weight loss or  weight gain?  No   Diabetes:  Is the patient diabetic?  Yes  If diabetic, was a CBG obtained today?  No  Did the patient bring in their glucometer from home?  No  How often do you monitor your CBG's? .   Financial Strains and Diabetes Management:  Are you having any financial strains with the device, your supplies or your medication? No .  Does the patient want to be seen by Chronic Care Management for management of their  diabetes?  No  Would the patient like to be referred to a Nutritionist or for Diabetic Management?  No   Diabetic Exams:  Diabetic Eye Exam: . Pt has been advised about the importance in completing this exam Advised pt to expect a call from office referred to regarding appt.  Diabetic Foot Exam:. Pt has been advised about the importance in completing this exam..    Community Resource Referral / Chronic Care Management: CRR required this visit?  No   CCM required this visit?  No     Plan:     I have personally reviewed and noted the following in the patient's chart:   Medical and social history Use of alcohol, tobacco or illicit drugs  Current medications and supplements including opioid prescriptions. Patient is not currently taking opioid prescriptions. Functional ability and status Nutritional status Physical activity Advanced directives List of other physicians Hospitalizations, surgeries, and ER visits in previous 12 months Vitals Screenings to include cognitive, depression, and falls Referrals and appointments  In addition, I have reviewed and discussed with patient certain preventive protocols, quality metrics, and best practice recommendations. A written personalized care plan for preventive services as well as general preventive health recommendations were provided to patient.     Remi Haggard, LPN   [id number]   After Visit Summary: (MyChart) Due to this being a telephonic visit, the after visit summary with patients personalized plan was  offered to patient via MyChart   Nurse Notes:

## 2023-09-18 NOTE — Patient Instructions (Signed)
 Jeff Lopez , Thank you for taking time to come for your Medicare Wellness Visit. I appreciate your ongoing commitment to your health goals. Please review the following plan we discussed and let me know if I can assist you in the future.   Screening recommendations/referrals: Colonoscopy: Education provied Recommended yearly ophthalmology/optometry visit for glaucoma screening and checkup Recommended yearly dental visit for hygiene and checkup  Vaccinations: Influenza vaccine: up to date Pneumococcal vaccine: up tod ate Tdap vaccine: up to date Shingles vaccine: up to date    Advanced directives: Education provided   Preventive Care 72 Years and Older, Male Preventive care refers to lifestyle choices and visits with your health care provider that can promote health and wellness. What does preventive care include? A yearly physical exam. This is also called an annual well check. Dental exams once or twice a year. Routine eye exams. Ask your health care provider how often you should have your eyes checked. Personal lifestyle choices, including: Daily care of your teeth and gums. Regular physical activity. Eating a healthy diet. Avoiding tobacco and drug use. Limiting alcohol use. Practicing safe sex. Taking low doses of aspirin every day. Taking vitamin and mineral supplements as recommended by your health care provider. What happens during an annual well check? The services and screenings done by your health care provider during your annual well check will depend on your age, overall health, lifestyle risk factors, and family history of disease. Counseling  Your health care provider may ask you questions about your: Alcohol use. Tobacco use. Drug use. Emotional well-being. Home and relationship well-being. Sexual activity. Eating habits. History of falls. Memory and ability to understand (cognition). Work and work Astronomer. Screening  You may have the following tests  or measurements: Height, weight, and BMI. Blood pressure. Lipid and cholesterol levels. These may be checked every 5 years, or more frequently if you are over 72 years old. Skin check. Lung cancer screening. You may have this screening every year starting at age 72 if you have a 30-pack-year history of smoking and currently smoke or have quit within the past 15 years. Fecal occult blood test (FOBT) of the stool. You may have this test every year starting at age 72. Flexible sigmoidoscopy or colonoscopy. You may have a sigmoidoscopy every 5 years or a colonoscopy every 10 years starting at age 72. Prostate cancer screening. Recommendations will vary depending on your family history and other risks. Hepatitis C blood test. Hepatitis B blood test. Sexually transmitted disease (STD) testing. Diabetes screening. This is done by checking your blood sugar (glucose) after you have not eaten for a while (fasting). You may have this done every 1-3 years. Abdominal aortic aneurysm (AAA) screening. You may need this if you are a current or former smoker. Osteoporosis. You may be screened starting at age 72 if you are at high risk. Talk with your health care provider about your test results, treatment options, and if necessary, the need for more tests. Vaccines  Your health care provider may recommend certain vaccines, such as: Influenza vaccine. This is recommended every year. Tetanus, diphtheria, and acellular pertussis (Tdap, Td) vaccine. You may need a Td booster every 10 years. Zoster vaccine. You may need this after age 72. Pneumococcal 13-valent conjugate (PCV13) vaccine. One dose is recommended after age 72. Pneumococcal polysaccharide (PPSV23) vaccine. One dose is recommended after age 72. Talk to your health care provider about which screenings and vaccines you need and how often you need them. This information  is not intended to replace advice given to you by your health care provider. Make  sure you discuss any questions you have with your health care provider. Document Released: 07/09/2015 Document Revised: 03/01/2016 Document Reviewed: 04/13/2015 Elsevier Interactive Patient Education  2017 [employer].  Fall Prevention in the Home Falls can cause injuries. They can happen to people of all ages. There are many things you can do to make your home safe and to help prevent falls. What can I do on the outside of my home? Regularly fix the edges of walkways and driveways and fix any cracks. Remove anything that might make you trip as you walk through a door, such as a raised step or threshold. Trim any bushes or trees on the path to your home. Use bright outdoor lighting. Clear any walking paths of anything that might make someone trip, such as rocks or tools. Regularly check to see if handrails are loose or broken. Make sure that both sides of any steps have handrails. Any raised decks and porches should have guardrails on the edges. Have any leaves, snow, or ice cleared regularly. Use sand or salt on walking paths during winter. Clean up any spills in your garage right away. This includes oil or grease spills. What can I do in the bathroom? Use night lights. Install grab bars by the toilet and in the tub and shower. Do not use towel bars as grab bars. Use non-skid mats or decals in the tub or shower. If you need to sit down in the shower, use a plastic, non-slip stool. Keep the floor dry. Clean up any water that spills on the floor as soon as it happens. Remove soap buildup in the tub or shower regularly. Attach bath mats securely with double-sided non-slip rug tape. Do not have throw rugs and other things on the floor that can make you trip. What can I do in the bedroom? Use night lights. Make sure that you have a light by your bed that is easy to reach. Do not use any sheets or blankets that are too big for your bed. They should not hang down onto the floor. Have a firm  chair that has side arms. You can use this for support while you get dressed. Do not have throw rugs and other things on the floor that can make you trip. What can I do in the kitchen? Clean up any spills right away. Avoid walking on wet floors. Keep items that you use a lot in easy-to-reach places. If you need to reach something above you, use a strong step stool that has a grab bar. Keep electrical cords out of the way. Do not use floor polish or wax that makes floors slippery. If you must use wax, use non-skid floor wax. Do not have throw rugs and other things on the floor that can make you trip. What can I do with my stairs? Do not leave any items on the stairs. Make sure that there are handrails on both sides of the stairs and use them. Fix handrails that are broken or loose. Make sure that handrails are as long as the stairways. Check any carpeting to make sure that it is firmly attached to the stairs. Fix any carpet that is loose or worn. Avoid having throw rugs at the top or bottom of the stairs. If you do have throw rugs, attach them to the floor with carpet tape. Make sure that you have a light switch at the top of  the stairs and the bottom of the stairs. If you do not have them, ask someone to add them for you. What else can I do to help prevent falls? Wear shoes that: Do not have high heels. Have rubber bottoms. Are comfortable and fit you well. Are closed at the toe. Do not wear sandals. If you use a stepladder: Make sure that it is fully opened. Do not climb a closed stepladder. Make sure that both sides of the stepladder are locked into place. Ask someone to hold it for you, if possible. Clearly mark and make sure that you can see: Any grab bars or handrails. First and last steps. Where the edge of each step is. Use tools that help you move around (mobility aids) if they are needed. These include: Canes. Walkers. Scooters. Crutches. Turn on the lights when you go  into a dark area. Replace any light bulbs as soon as they burn out. Set up your furniture so you have a clear path. Avoid moving your furniture around. If any of your floors are uneven, fix them. If there are any pets around you, be aware of where they are. Review your medicines with your doctor. Some medicines can make you feel dizzy. This can increase your chance of falling. Ask your doctor what other things that you can do to help prevent falls. This information is not intended to replace advice given to you by your health care provider. Make sure you discuss any questions you have with your health care provider. Document Released: 04/08/2009 Document Revised: 11/18/2015 Document Reviewed: 07/17/2014 Elsevier Interactive Patient Education  2017 [employer].

## 2023-09-28 ENCOUNTER — Other Ambulatory Visit: Payer: Self-pay | Admitting: Family Medicine

## 2023-09-28 ENCOUNTER — Other Ambulatory Visit: Payer: Self-pay | Admitting: Cardiovascular Disease

## 2023-09-28 DIAGNOSIS — I10 Essential (primary) hypertension: Secondary | ICD-10-CM

## 2023-10-02 ENCOUNTER — Encounter (HOSPITAL_BASED_OUTPATIENT_CLINIC_OR_DEPARTMENT_OTHER): Payer: PPO

## 2023-10-03 ENCOUNTER — Ambulatory Visit (INDEPENDENT_AMBULATORY_CARE_PROVIDER_SITE_OTHER): Admitting: Podiatry

## 2023-10-03 ENCOUNTER — Ambulatory Visit (INDEPENDENT_AMBULATORY_CARE_PROVIDER_SITE_OTHER)

## 2023-10-03 ENCOUNTER — Encounter: Payer: Self-pay | Admitting: Podiatry

## 2023-10-03 VITALS — Ht 70.0 in | Wt 206.0 lb

## 2023-10-03 DIAGNOSIS — M2041 Other hammer toe(s) (acquired), right foot: Secondary | ICD-10-CM

## 2023-10-04 ENCOUNTER — Ambulatory Visit: Payer: PPO | Attending: Cardiovascular Disease | Admitting: Cardiovascular Disease

## 2023-10-04 ENCOUNTER — Encounter: Payer: Self-pay | Admitting: Cardiovascular Disease

## 2023-10-04 VITALS — BP 148/92 | HR 65 | Ht 70.0 in | Wt 210.4 lb

## 2023-10-04 DIAGNOSIS — E782 Mixed hyperlipidemia: Secondary | ICD-10-CM | POA: Diagnosis not present

## 2023-10-04 DIAGNOSIS — I2510 Atherosclerotic heart disease of native coronary artery without angina pectoris: Secondary | ICD-10-CM | POA: Diagnosis not present

## 2023-10-04 DIAGNOSIS — I10 Essential (primary) hypertension: Secondary | ICD-10-CM

## 2023-10-04 DIAGNOSIS — I70212 Atherosclerosis of native arteries of extremities with intermittent claudication, left leg: Secondary | ICD-10-CM | POA: Diagnosis not present

## 2023-10-04 NOTE — Patient Instructions (Signed)
 Follow-Up: At [facility], you and your health needs are our priority.  As part of our continuing mission to provide you with exceptional heart care, our providers are all part of one team.  This team includes your primary Cardiologist (physician) and Advanced Practice Providers or APPs (Physician Assistants and Nurse Practitioners) who all work together to provide you with the care you need, when you need it.  Your next appointment:   1 year(s)  Provider:   Tonny Bollman, MD        1st Floor: - Lobby - Registration  - Pharmacy  - Lab - Cafe  2nd Floor: - PV Lab - Diagnostic Testing (echo, CT, nuclear med)  3rd Floor: - Vacant  4th Floor: - TCTS (cardiothoracic surgery) - AFib Clinic - Structural Heart Clinic - Vascular Surgery  - Vascular Ultrasound  5th Floor: - HeartCare Cardiology (general and EP) - Clinical Pharmacy for coumadin, hypertension, lipid, weight-loss medications, and med management appointments    Valet parking services will be available as well.

## 2023-10-04 NOTE — Progress Notes (Signed)
 Cardiology Office Note:    Date:  10/04/2023   ID:  [name], DOB 12/05/1950, MRN [id number]  PCP:  de [place], Raymond J, MD   [facility] Providers Cardiologist:  Tonny Bollman, MD     Referring MD: de [place], Buren Kos, MD   Chief Complaint  Patient presents with   Coronary Artery Disease    History of Present Illness:    Jeff Lopez is a 73 y.o. male with a hx of nonobstructive coronary artery disease, hypertension, type 2 diabetes, former smoker quit in 2009, peripheral arterial disease, and mixed hyperlipidemia. Since his last visit, he underwent left SFA stenting by Dr Randie Heinz.   The patient is here alone today. He is doing well from a cardiac perspective. Today, he denies symptoms of palpitations, PND, lower extremity edema, dizziness, or syncope. He complains of shortness of breath with activity and occasionally at night he feels a pressure in his chest when lying down at night. He had right foot surgery in February 2025 and is in a soft boot today. Attributes much of this to weight gain.  His most recent hemoglobin A1c was 10.8 and this is down from a hemoglobin A1c of 12.  He has gained a lot of weight in the abdominal area.  States that he has a hard time tying his shoes and he gets short of breath with bending forward.  He was on Rybelsus but could not tolerate this due to GI side effects.    Current Medications: Current Meds  Medication Sig   amLODipine-olmesartan (AZOR) 5-40 MG tablet TAKE 1 TABLET BY MOUTH ONCE DAILY   aspirin EC 81 MG tablet Take 81 mg by mouth daily. Swallow whole.   clopidogrel (PLAVIX) 75 MG tablet TAKE 1 TABLET BY MOUTH EVERY DAY   eszopiclone (LUNESTA) 2 MG TABS tablet TAKE 1 TABLET BY MOUTH AT BEDTIME (Patient taking differently: Take 2 mg by mouth at bedtime as needed for sleep.)   glipiZIDE (GLUCOTROL XL) 10 MG 24 hr tablet Take 1 tablet (10 mg total) by mouth daily with breakfast.   levothyroxine (SYNTHROID) 137 MCG tablet  TAKE 1 TABLET BY MOUTH EVERY DAY BEFORE BREAKFAST   metFORMIN (GLUCOPHAGE-XR) 500 MG 24 hr tablet Take 2 tablets (1,000 mg total) by mouth daily with breakfast. (Patient taking differently: Take 1,000 mg by mouth every other day. Every other day due to diarrhea)   metoprolol succinate (TOPROL-XL) 50 MG 24 hr tablet TAKE 1 TABLET BY MOUTH EVERY DAY WITH OR IMMEDIATELY FOLLOWING A MEAL   pantoprazole (PROTONIX) 40 MG tablet TAKE 1 TABLET BY MOUTH 2 TIMES DAILY BEFORE A MEAL (Patient taking differently: Take 40 mg by mouth 2 (two) times daily.)   rosuvastatin (CRESTOR) 20 MG tablet TAKE 1 TABLET BY MOUTH EVERY DAY     Allergies:   Ceftin, Cefuroxime axetil, and Cephalexin   ROS:   Please see the history of present illness.    All other systems reviewed and are negative.  EKGs/Labs/Other Studies Reviewed:    The following studies were reviewed today: Cardiac Studies & Procedures   ______________________________________________________________________________________________   STRESS TESTS  MYOCARDIAL PERFUSION IMAGING 09/30/2021  Narrative   The study is normal. The study is low risk.   No ST deviation was noted.   Left ventricular function is abnormal. Global function is mildly reduced. Nuclear stress EF: 52 %. The left ventricular ejection fraction is mildly decreased (45-54%). End diastolic cavity size is normal.   Prior study available  for comparison from 04/11/2018.  Small size, mild intensity inferior/infero apical lesion appears worse at rest then stress consistent with artifact. SDS =2   ECHOCARDIOGRAM  ECHOCARDIOGRAM COMPLETE 10/09/2022  Narrative ECHOCARDIOGRAM REPORT    Patient Name:   Jeff Lopez Date of Exam: 10/09/2022 Medical Rec #:  [id number]         Height:       69.0 in Accession #:    [id number]        Weight:       196.0 lb Date of Birth:  July 04, 1950         BSA:          2.048 m Patient Age:    71 years          BP:           141/89 mmHg Patient  Gender: M                 HR:           56 bpm. Exam Location:  [address]  Procedure: 2D Echo, 3D Echo, Cardiac Doppler and Color Doppler  Indications:    R07.9 chest Pain  History:        Patient has prior history of Echocardiogram examinations, most recent 09/20/2015. CAD; Risk Factors:Hypertension, Diabetes and HLD.  Sonographer:    Clearence Ped RCS Referring Phys: 51 TESSA N CONTE  IMPRESSIONS   1. Left ventricular ejection fraction, by estimation, is 60 to 65%. Left ventricular ejection fraction by 3D volume is 62 %. The left ventricle has normal function. The left ventricle has no regional wall motion abnormalities. The left ventricular internal cavity size was mildly dilated. Left ventricular diastolic parameters are consistent with Grade I diastolic dysfunction (impaired relaxation). 2. Right ventricular systolic function is normal. The right ventricular size is normal. There is normal pulmonary artery systolic pressure. The estimated right ventricular systolic pressure is 23.6 mmHg. 3. The mitral valve is normal in structure. No evidence of mitral valve regurgitation. No evidence of mitral stenosis. 4. The aortic valve is normal in structure. Aortic valve regurgitation is mild. No aortic stenosis is present. Aortic regurgitation PHT measures 493 msec. 5. The inferior vena cava is normal in size with greater than 50% respiratory variability, suggesting right atrial pressure of 3 mmHg.  FINDINGS Left Ventricle: Left ventricular ejection fraction, by estimation, is 60 to 65%. Left ventricular ejection fraction by 3D volume is 62 %. The left ventricle has normal function. The left ventricle has no regional wall motion abnormalities. The left ventricular internal cavity size was mildly dilated. There is no left ventricular hypertrophy. Left ventricular diastolic parameters are consistent with Grade I diastolic dysfunction (impaired relaxation).  Right Ventricle: The right  ventricular size is normal. No increase in right ventricular wall thickness. Right ventricular systolic function is normal. There is normal pulmonary artery systolic pressure. The tricuspid regurgitant velocity is 2.27 m/s, and with an assumed right atrial pressure of 3 mmHg, the estimated right ventricular systolic pressure is 23.6 mmHg.  Left Atrium: Left atrial size was normal in size.  Right Atrium: Right atrial size was normal in size.  Pericardium: There is no evidence of pericardial effusion.  Mitral Valve: The mitral valve is normal in structure. No evidence of mitral valve regurgitation. No evidence of mitral valve stenosis.  Tricuspid Valve: The tricuspid valve is normal in structure. Tricuspid valve regurgitation is trivial. No evidence of tricuspid stenosis.  Aortic Valve: The aortic valve is normal  in structure. Aortic valve regurgitation is mild. Aortic regurgitation PHT measures 493 msec. No aortic stenosis is present.  Pulmonic Valve: The pulmonic valve was normal in structure. Pulmonic valve regurgitation is not visualized. No evidence of pulmonic stenosis.  Aorta: The aortic root is normal in size and structure.  Venous: The inferior vena cava is normal in size with greater than 50% respiratory variability, suggesting right atrial pressure of 3 mmHg.  IAS/Shunts: No atrial level shunt detected by color flow Doppler.   LEFT VENTRICLE PLAX 2D LVIDd:         5.40 cm         Diastology LVIDs:         3.70 cm         LV e' medial:    5.77 cm/s LV PW:         1.10 cm         LV E/e' medial:  14.9 LV IVS:        0.90 cm         LV e' lateral:   10.80 cm/s LVOT diam:     2.00 cm         LV E/e' lateral: 8.0 LV SV:         82 LV SV Index:   40 LVOT Area:     3.14 cm        3D Volume EF LV 3D EF:    Left ventricul ar ejection fraction by 3D volume is 62 %.  3D Volume EF: 3D EF:        62 % LV EDV:       144 ml LV ESV:       55 ml LV SV:        89 ml  RIGHT  VENTRICLE RV Basal diam:  3.60 cm RV S prime:     8.59 cm/s TAPSE (M-mode): 1.8 cm RVSP:           23.6 mmHg  LEFT ATRIUM             Index        RIGHT ATRIUM           Index LA diam:        5.00 cm 2.44 cm/m   RA Pressure: 3.00 mmHg LA Vol (A2C):   52.0 ml 25.39 ml/m  RA Area:     16.50 cm LA Vol (A4C):   37.4 ml 18.26 ml/m  RA Volume:   41.40 ml  20.21 ml/m LA Biplane Vol: 47.6 ml 23.24 ml/m AORTIC VALVE LVOT Vmax:   113.00 cm/s LVOT Vmean:  74.200 cm/s LVOT VTI:    0.260 m AI PHT:      493 msec  AORTA Ao Root diam: 3.50 cm Ao Asc diam:  3.70 cm  MITRAL VALVE               TRICUSPID VALVE MV Area (PHT):             TR Peak grad:   20.6 mmHg MV Decel Time:             TR Vmax:        227.00 cm/s MV E velocity: 85.90 cm/s  Estimated RAP:  3.00 mmHg MV A velocity: 99.00 cm/s  RVSP:           23.6 mmHg MV E/A ratio:  0.87 SHUNTS Systemic VTI:  0.26 m Systemic Diam: 2.00 cm  Donato Schultz MD Electronically  signed by Donato Schultz MD Signature Date/Time: 10/09/2022/12:39:17 PM    Final          ______________________________________________________________________________________________      EKG:   EKG Interpretation Date/Time:  Thursday October 04 2023 10:02:42 EDT Ventricular Rate:  57 PR Interval:  200 QRS Duration:  84 QT Interval:  408 QTC Calculation: 397 R Axis:   -16  Text Interpretation: Sinus bradycardia When compared with ECG of 22-Aug-2022 10:44, No significant change was found Confirmed by Tonny Bollman [phone]) on 10/04/2023 10:23:52 AM    Recent Labs: 02/14/2023: ALT 31; BUN 19; Creatinine, Ser 1.25; Hemoglobin 14.7; Platelets 159; Potassium 4.1; Sodium 143; TSH 1.810  Recent Lipid Panel    Component Value Date/Time   CHOL 98 (L) 02/14/2023 0840   TRIG 121 02/14/2023 0840   HDL 36 (L) 02/14/2023 0840   CHOLHDL 2.7 02/14/2023 0840   CHOLHDL 2.6 12/19/2022 0148   VLDL 27 12/19/2022 0148   LDLCALC 40 02/14/2023 0840   LDLCALC 75  01/19/2020 0953   LDLDIRECT 99.0 01/26/2021 0848           Physical Exam:    VS:  BP (!) 148/92   Pulse 65   Ht 5\' 10"  (1.778 m)   Wt 210 lb 6.4 oz (95.4 kg)   SpO2 90%   BMI 30.19 kg/m     Wt Readings from Last 3 Encounters:  10/04/23 210 lb 6.4 oz (95.4 kg)  10/03/23 206 lb (93.4 kg)  09/06/23 206 lb (93.4 kg)     GEN:  Well nourished, well developed in no acute distress HEENT: Normal NECK: No JVD; No carotid bruits LYMPHATICS: No lymphadenopathy CARDIAC: RRR, no murmurs, rubs, gallops RESPIRATORY:  Clear to auscultation without rales, wheezing or rhonchi  ABDOMEN: Soft, non-tender, non-distended, obese MUSCULOSKELETAL:  No edema; No deformity  SKIN: Warm and dry NEUROLOGIC:  Alert and oriented x 3 PSYCHIATRIC:  Normal affect   Assessment & Plan Coronary artery disease involving native coronary artery of native heart without angina pectoris Myoview scan in 2023 showed artifact with no ischemia and normal LVEF.  Patient is having no angina.  He will continue clopidogrel for antiplatelet therapy in the setting of his peripheral arterial disease.  Treated with a high intensity statin drug with lipids at goal.  Continue current management. Essential hypertension Blood pressure above goal.  Treated with amlodipine, olmesartan, and metoprolol succinate.  States his home readings are in good range.  Really needs to focus on lifestyle modification.  His blood pressure will come down with dietary changes and weight loss.  We talked about portion control, increasing exercise, and aiming to achieve 20 pound weight loss as a initial goal. Mixed hyperlipidemia Lipids at goal with LDL of 40 mg/dL.  Lengthy discussion on diet and lifestyle modification.  Continue current medical therapy. Atherosclerosis of native artery of left lower extremity with intermittent claudication (HCC) No claudication symptoms after most recent left SFA stent.  He will continue on clopidogrel long-term as  tolerated.  Continue risk reduction measures with focus on diabetic control and weight loss.       Medication Adjustments/Labs and Tests Ordered: Current medicines are reviewed at length with the patient today.  Concerns regarding medicines are outlined above.  Orders Placed This Encounter  Procedures   EKG 12-Lead   No orders of the defined types were placed in this encounter.   Patient Instructions  Follow-Up: At [facility], you and your health needs are our priority.  As  part of our continuing mission to provide you with exceptional heart care, our providers are all part of one team.  This team includes your primary Cardiologist (physician) and Advanced Practice Providers or APPs (Physician Assistants and Nurse Practitioners) who all work together to provide you with the care you need, when you need it.  Your next appointment:   1 year(s)  Provider:   Tonny Bollman, MD        1st Floor: - Lobby - Registration  - Pharmacy  - Lab - Cafe  2nd Floor: - PV Lab - Diagnostic Testing (echo, CT, nuclear med)  3rd Floor: - Vacant  4th Floor: - TCTS (cardiothoracic surgery) - AFib Clinic - Structural Heart Clinic - Vascular Surgery  - Vascular Ultrasound  5th Floor: - HeartCare Cardiology (general and EP) - Clinical Pharmacy for coumadin, hypertension, lipid, weight-loss medications, and med management appointments    Valet parking services will be available as well.     Signed, Tonny Bollman, MD  10/04/2023 1:32 PM    [facility]

## 2023-10-04 NOTE — Progress Notes (Signed)
 Subjective:   Patient ID: Jeff Lopez, male   DOB: 73 y.o.   MRN: [id number]   HPI Patient states he is doing very well he is very pleased with how his toe is doing   ROS      Objective:  Physical Exam  Neurovascular status intact negative Denna Haggard' sign noted digit 3 for healing well right mild swelling with pin in place third toe good alignment of the digit     Assessment:  Overall doing well post digital fusion third distal arthroplasty fourth right     Plan:  H&P x-rays reviewed pin is pulled sterile dressing is applied third digit gradual return to soft shoe gear debrided the distal keratotic lesion and reappoint as symptoms indicate with approximate 8 more weeks of healing.  All questions answered  X-rays indicate toe appears to be in very good position and structurally sound

## 2023-10-18 ENCOUNTER — Other Ambulatory Visit: Payer: Self-pay | Admitting: Family Medicine

## 2023-10-18 ENCOUNTER — Other Ambulatory Visit (HOSPITAL_BASED_OUTPATIENT_CLINIC_OR_DEPARTMENT_OTHER): Payer: Self-pay | Admitting: Family Medicine

## 2023-10-18 DIAGNOSIS — E890 Postprocedural hypothyroidism: Secondary | ICD-10-CM

## 2023-11-26 ENCOUNTER — Other Ambulatory Visit (HOSPITAL_BASED_OUTPATIENT_CLINIC_OR_DEPARTMENT_OTHER): Payer: Self-pay | Admitting: Family Medicine

## 2023-11-26 ENCOUNTER — Other Ambulatory Visit: Payer: Self-pay | Admitting: Family Medicine

## 2023-11-30 ENCOUNTER — Other Ambulatory Visit (HOSPITAL_BASED_OUTPATIENT_CLINIC_OR_DEPARTMENT_OTHER)

## 2023-11-30 DIAGNOSIS — E1143 Type 2 diabetes mellitus with diabetic autonomic (poly)neuropathy: Secondary | ICD-10-CM

## 2023-11-30 LAB — HEMOGLOBIN A1C
Est. average glucose Bld gHb Est-mCnc: 200 mg/dL
Hgb A1c MFr Bld: 8.6 % — ABNORMAL HIGH (ref 4.8–5.6)

## 2023-12-07 ENCOUNTER — Encounter (HOSPITAL_BASED_OUTPATIENT_CLINIC_OR_DEPARTMENT_OTHER): Payer: Self-pay | Admitting: Family Medicine

## 2023-12-07 ENCOUNTER — Ambulatory Visit (INDEPENDENT_AMBULATORY_CARE_PROVIDER_SITE_OTHER): Admitting: Family Medicine

## 2023-12-07 VITALS — BP 150/75 | HR 63 | Ht 70.0 in | Wt 203.6 lb

## 2023-12-07 DIAGNOSIS — E785 Hyperlipidemia, unspecified: Secondary | ICD-10-CM

## 2023-12-07 DIAGNOSIS — E538 Deficiency of other specified B group vitamins: Secondary | ICD-10-CM | POA: Diagnosis not present

## 2023-12-07 DIAGNOSIS — E1169 Type 2 diabetes mellitus with other specified complication: Secondary | ICD-10-CM

## 2023-12-07 DIAGNOSIS — Z7984 Long term (current) use of oral hypoglycemic drugs: Secondary | ICD-10-CM

## 2023-12-07 DIAGNOSIS — I10 Essential (primary) hypertension: Secondary | ICD-10-CM | POA: Diagnosis not present

## 2023-12-07 DIAGNOSIS — E1143 Type 2 diabetes mellitus with diabetic autonomic (poly)neuropathy: Secondary | ICD-10-CM

## 2023-12-07 NOTE — Progress Notes (Signed)
    Procedures performed today:    None.  Independent interpretation of notes and tests performed by another provider:   None.  Brief History, Exam, Impression, and Recommendations:    BP (!) 150/75 (BP Location: Left Arm, Patient Position: Sitting, Cuff Size: Large)   Pulse 63   Ht 5' 10 (1.778 m)   Wt 203 lb 9.6 oz (92.4 kg)   SpO2 96%   BMI 29.21 kg/m   Type II diabetes mellitus with peripheral autonomic neuropathy (HCC) Assessment & Plan: Patient with improvement in hemoglobin A1c, but still above goal at 8.6%.  He has been focusing on lifestyle modifications. He continues with metformin  and glipizide .  We reviewed options today including potential for medication adjustments.  He continues to be hesitant regarding injectable medications, indicating that he would be fine with receiving them, however would not be able to administer them himself.  He is wanting to be on less medications. He would like to continue with current medication regimen and continue to focus on lifestyle modifications with plan to recheck A1c in about 3 months.  If A1c continues to improve, he would ultimately like to potentially transition off of some medication, may be open to starting injectable medication with plan to gradually discontinue medications including glipizide  and likely metformin .  We discussed considerations in this regard.  We will plan to recheck A1c shortly before next appointment and then can further discuss progress at that time and considerations for medication adjustments  Orders: -     Hemoglobin A1c; Future -     Lipid panel; Future  Primary hypertension Assessment & Plan: Blood pressure slightly above goal in office today. Has been better controlled in office with current regimen. Can continue current medications, no changes to be made today Recommend intermittent monitoring of blood pressure at home, DASH diet   B12 deficiency Assessment & Plan: Patient at risk for B12  deficiency with metformin  use, will check with upcoming labs.  Orders: -     Vitamin B12; Future  Hyperlipidemia associated with type 2 diabetes mellitus (HCC) -     Lipid panel; Future  Return in about 3 months (around 03/08/2024) for diabetes.  Spent 32 minutes on this patient encounter, including preparation, chart review, face-to-face counseling with patient and coordination of care, and documentation of encounter   ___________________________________________ Jisella Ashenfelter de Peru, MD, ABFM, [facility] Primary Care and Sports Medicine [facility]

## 2023-12-07 NOTE — Assessment & Plan Note (Signed)
 Patient at risk for B12 deficiency with metformin  use, will check with upcoming labs.

## 2023-12-07 NOTE — Patient Instructions (Signed)
  Medication Instructions:  Your physician recommends that you continue on your current medications as directed. Please refer to the Current Medication list given to you today. --If you need a refill on any your medications before your next appointment, please call your pharmacy first. If no refills are authorized on file call the office.-- Lab Work: Your physician has recommended that you have lab work today: 1 week before appointment If you have labs (blood work) drawn today and your tests are completely normal, you will receive your results via MyChart message OR a phone call from our staff.  Please ensure you check your voicemail in the event that you authorized detailed messages to be left on a delegated number. If you have any lab test that is abnormal or we need to change your treatment, we will call you to review the results.  Referrals/Procedures/Imaging: no  Follow-Up: Your next appointment:   Your physician recommends that you schedule a follow-up appointment in: 3 months with Dr. de Peru  You will receive a text message or e-mail with a link to a survey about your care and experience with us  today! We would greatly appreciate your feedback!   Thanks for letting us  be apart of your health journey!!  Primary Care and Sports Medicine   Dr. Court Distance Peru   We encourage you to activate your patient portal called MyChart.  Sign up information is provided on this After Visit Summary.  MyChart is used to connect with patients for Virtual Visits (Telemedicine).  Patients are able to view lab/test results, encounter notes, upcoming appointments, etc.  Non-urgent messages can be sent to your provider as well. To learn more about what you can do with MyChart, please visit --  [email or web address].

## 2023-12-07 NOTE — Assessment & Plan Note (Signed)
 Blood pressure slightly above goal in office today. Has been better controlled in office with current regimen. Can continue current medications, no changes to be made today Recommend intermittent monitoring of blood pressure at home, DASH diet

## 2023-12-07 NOTE — Assessment & Plan Note (Addendum)
 Patient with improvement in hemoglobin A1c, but still above goal at 8.6%.  He has been focusing on lifestyle modifications. He continues with metformin  and glipizide .  We reviewed options today including potential for medication adjustments.  He continues to be hesitant regarding injectable medications, indicating that he would be fine with receiving them, however would not be able to administer them himself.  He is wanting to be on less medications. He would like to continue with current medication regimen and continue to focus on lifestyle modifications with plan to recheck A1c in about 3 months.  If A1c continues to improve, he would ultimately like to potentially transition off of some medication, may be open to starting injectable medication with plan to gradually discontinue medications including glipizide  and likely metformin .  We discussed considerations in this regard.  We will plan to recheck A1c shortly before next appointment and then can further discuss progress at that time and considerations for medication adjustments

## 2023-12-13 ENCOUNTER — Other Ambulatory Visit (HOSPITAL_BASED_OUTPATIENT_CLINIC_OR_DEPARTMENT_OTHER): Payer: Self-pay | Admitting: Family Medicine

## 2023-12-24 ENCOUNTER — Other Ambulatory Visit: Payer: Self-pay | Admitting: Family Medicine

## 2023-12-24 ENCOUNTER — Other Ambulatory Visit: Payer: Self-pay | Admitting: Cardiovascular Disease

## 2023-12-24 DIAGNOSIS — I10 Essential (primary) hypertension: Secondary | ICD-10-CM

## 2024-01-09 ENCOUNTER — Ambulatory Visit (HOSPITAL_COMMUNITY)
Admission: RE | Admit: 2024-01-09 | Discharge: 2024-01-09 | Disposition: A | Discharge status: Home / Self Care | Payer: PPO | Source: Ambulatory Visit | Attending: Vascular Surgery | Admitting: Vascular Surgery

## 2024-01-09 ENCOUNTER — Ambulatory Visit (HOSPITAL_BASED_OUTPATIENT_CLINIC_OR_DEPARTMENT_OTHER)
Admission: RE | Admit: 2024-01-09 | Discharge: 2024-01-09 | Discharge status: Home / Self Care | Payer: PPO | Source: Ambulatory Visit | Attending: Vascular Surgery

## 2024-01-09 ENCOUNTER — Ambulatory Visit: Payer: PPO

## 2024-01-09 VITALS — BP 160/79 | HR 71 | Temp 97.9°F | Ht 71.0 in | Wt 204.3 lb

## 2024-01-09 DIAGNOSIS — I70212 Atherosclerosis of native arteries of extremities with intermittent claudication, left leg: Secondary | ICD-10-CM

## 2024-01-09 DIAGNOSIS — I739 Peripheral vascular disease, unspecified: Secondary | ICD-10-CM | POA: Diagnosis present

## 2024-01-09 LAB — VAS US ABI WITH/WO TBI
Left ABI: 1.14
Right ABI: 1.04

## 2024-01-09 NOTE — Progress Notes (Signed)
 Office Note     CC:  follow up Requesting Provider:  de Peru, Quintin PARAS, MD  HPI: Jeff Lopez is a 73 y.o. (Dec 03, 1950) male who presents for surveillance of PAD.  He has history of left SFA stenting on 2 separate occasions for claudication.  He denies any claudication symptoms since last office visit.  He has stopped Plavix  and aspirin .  He continues to take statin daily.  He is a former smoker.  He is walking without any difficulties.   Past Medical History:  Diagnosis Date   Allergy    per pt   CAD, NATIVE VESSEL 04/19/2010   nonobstructive by cath 2007:  oLAD 20-30%, mLAD 50%, pCFX 20-30%, oAVCFX 20-30%, L renal art 50%;  normal LVF   Cataract    bil cataracts removed   COPD (chronic obstructive pulmonary disease) (HCC)    Diabetes mellitus without complication (HCC)    GERD 04/09/2007   Headache(784.0)    HYPERLIPIDEMIA 04/09/2007   Patient denies.   HYPERTENSION, BENIGN 04/19/2010   Hypothyroidism    Hypothyroidism    previous hyperthyroidism, s/p I-131   LUMBAR DISC DISORDER 05/27/2010   Nerve damage    Left leg   OSTEOARTHRITIS, LUMBAR SPINE 04/09/2007   RENAL CYST 05/27/2010   Spinal headache    After having back surgery in 2014    Past Surgical History:  Procedure Laterality Date   ABDOMINAL AORTOGRAM W/LOWER EXTREMITY N/A 07/03/2022   Procedure: ABDOMINAL AORTOGRAM W/LOWER EXTREMITY;  Surgeon: Sheree Penne Bruckner, MD;  Location: [facility] INVASIVE CV LAB;  Service: Cardiovascular;  Laterality: N/A;   ABDOMINAL AORTOGRAM W/LOWER EXTREMITY N/A 12/18/2022   Procedure: ABDOMINAL AORTOGRAM W/LOWER EXTREMITY;  Surgeon: Sheree Penne Bruckner, MD;  Location: [facility] INVASIVE CV LAB;  Service: Cardiovascular;  Laterality: N/A;   BACK SURGERY  2012,2014   CARDIAC CATHETERIZATION  2007   Dr Kassie   CHOLECYSTECTOMY     CHOLECYSTECTOMY N/A 07/13/2013   Procedure: LAPAROSCOPIC CHOLECYSTECTOMY WITH INTRAOPERATIVE CHOLANGIOGRAM;  Surgeon: Alm VEAR Angle, MD;  Location: [facility]  ORS;  Service: General;  Laterality: N/A;   COLONOSCOPY  2018   COLONOSCOPY W/ POLYPECTOMY     ERCP N/A 07/14/2013   Procedure: ENDOSCOPIC RETROGRADE CHOLANGIOPANCREATOGRAPHY (ERCP);  Surgeon: Toribio SHAUNNA Cedar, MD;  Location: [facility];  Service: Endoscopy;  Laterality: N/A;   EYE SURGERY Bilateral    Cataract removal   FOOT FRACTURE SURGERY     FRACTURE SURGERY     LUMBAR DISC SURGERY  08/06/2012   L3 & L4   LUMBAR DISC SURGERY  11/11/2015   L1 L2    DR NITKA   LUMBAR FUSION N/A 04/20/2015   Procedure: T12 to L1 fusion (Extension of Previous Fusion L2-S1 to T12-S1), Right Transforaminal lumbar interbody fusion, Posterior Fusion T12 to L1, with Pedicle screws, allograft, local bone graft, and  Vivigen;  Surgeon: Lynwood FORBES Better, MD;  Location: [facility] OR;  Service: Orthopedics;  Laterality: N/A;   LUMBAR LAMINECTOMY N/A 11/10/2013   Procedure: Left L1-2 far lateral approach to excise herniated nucleus pulposus;  Surgeon: Lynwood FORBES Better, MD;  Location: [facility] OR;  Service: Orthopedics;  Laterality: N/A;   LUMBAR LAMINECTOMY/DECOMPRESSION MICRODISCECTOMY Left 08/10/2012   Procedure: Dura Repair Left Side L2-L3;  Surgeon: Lynwood FORBES Better, MD;  Location: [facility] OR;  Service: Orthopedics;  Laterality: Left;  Wilson Frame, Sliding table, dura repair kit, microscope   NECK SURGERY     X 2   PERIPHERAL VASCULAR INTERVENTION Left 07/03/2022   Procedure: PERIPHERAL  VASCULAR INTERVENTION;  Surgeon: Sheree Penne Bruckner, MD;  Location: [facility] INVASIVE CV LAB;  Service: Cardiovascular;  Laterality: Left;  fem-pop   PERIPHERAL VASCULAR INTERVENTION  12/18/2022   Procedure: PERIPHERAL VASCULAR INTERVENTION;  Surgeon: Sheree Penne Bruckner, MD;  Location: [facility] INVASIVE CV LAB;  Service: Cardiovascular;;   SINOSCOPY     SPINE SURGERY  2006   C-spine surgery x 2   UPPER GASTROINTESTINAL ENDOSCOPY      Social History   Socioeconomic History   Marital status: Single    Spouse name: Not on file   Number of children: 1    Years of education: Not on file   Highest education level: Not on file  Occupational History   Occupation: DISTRIBUTION MGR    Employer: [employer]  Tobacco Use   Smoking status: Former    Current packs/day: 0.00    Average packs/day: 1 pack/day for 37.0 years (37.0 ttl pk-yrs)    Types: Cigarettes    Start date: 04/07/1971    Quit date: 04/06/2008    Years since quitting: 15.7    Passive exposure: Past   Smokeless tobacco: Never   Tobacco comments:    pt has stopped smoking about 9 months now  Vaping Use   Vaping status: Never Used  Substance and Sexual Activity   Alcohol use: No   Drug use: No   Sexual activity: Not Currently  Other Topics Concern   Not on file  Social History Narrative   Works in Set designer. Retired.   Lives alone, has one child in [name].    Social Drivers of Corporate investment banker Strain: Low Risk  (09/18/2023)   Overall Financial Resource Strain (CARDIA)    Difficulty of Paying Living Expenses: Not hard at all  Food Insecurity: No Food Insecurity (09/18/2023)   Hunger Vital Sign    Worried About Running Out of Food in the Last Year: Never true    Ran Out of Food in the Last Year: Never true  Transportation Needs: No Transportation Needs (09/18/2023)   PRAPARE - Administrator, Civil Service (Medical): No    Lack of Transportation (Non-Medical): No  Physical Activity: Sufficiently Active (09/18/2023)   Exercise Vital Sign    Days of Exercise per Week: 5 days    Minutes of Exercise per Session: 30 min  Stress: No Stress Concern Present (09/18/2023)   [employer] of Occupational Health - Occupational Stress Questionnaire    Feeling of Stress : Not at all  Social Connections: Socially Isolated (09/18/2023)   Social Connection and Isolation Panel    Frequency of Communication with Friends and Family: More than three times a week    Frequency of Social Gatherings with Friends and Family: More than three times a week     Attends Religious Services: Never    Database administrator or Organizations: No    Attends Banker Meetings: Never    Marital Status: Never married  Intimate Partner Violence: Not At Risk (09/18/2023)   Humiliation, Afraid, Rape, and Kick questionnaire    Fear of Current or Ex-Partner: No    Emotionally Abused: No    Physically Abused: No    Sexually Abused: No   Family History  Problem Relation Age of Onset   Breast cancer Mother    Uterine cancer Mother    Heart disease Mother    Heart attack Mother 57   Prostate cancer Father    Hypertension Brother  Non-Hodgkin's lymphoma Brother    Stomach cancer Paternal Grandmother    Thyroid  disease Neg Hx    Colon cancer Neg Hx    Esophageal cancer Neg Hx    Rectal cancer Neg Hx    Pancreatic cancer Neg Hx    Colon polyps Neg Hx     Current Outpatient Medications  Medication Sig Dispense Refill   amLODipine -olmesartan  (AZOR ) 5-40 MG tablet TAKE 1 TABLET BY MOUTH ONCE DAILY 90 tablet 0   clopidogrel  (PLAVIX ) 75 MG tablet TAKE 1 TABLET BY MOUTH EVERY DAY 30 tablet 11   eszopiclone  (LUNESTA ) 2 MG TABS tablet TAKE 1 TABLET BY MOUTH NIGHTLY AT BEDTIME AS NEEDED SLEEP 30 tablet 1   glipiZIDE  (GLUCOTROL  XL) 10 MG 24 hr tablet Take 1 tablet (10 mg total) by mouth daily with breakfast. 90 tablet 1   levothyroxine  (SYNTHROID ) 137 MCG tablet TAKE 1 TABLET BY MOUTH EVERY DAY BEFORE BREAKFAST 90 tablet 1   metFORMIN  (GLUCOPHAGE -XR) 500 MG 24 hr tablet Take 2 tablets (1,000 mg total) by mouth daily with breakfast. 180 tablet 1   metoprolol  succinate (TOPROL -XL) 50 MG 24 hr tablet TAKE 1 TABLET BY MOUTH EVERY DAY WITH OR immediately following A meal 90 tablet 2   pantoprazole  (PROTONIX ) 40 MG tablet TAKE 1 TABLET BY MOUTH 2 TIMES DAILY BEFORE A MEAL 180 tablet 3   rosuvastatin  (CRESTOR ) 20 MG tablet TAKE 1 TABLET BY MOUTH EVERY DAY (Patient taking differently: Take 20 mg by mouth every other day.) 90 tablet 1   aspirin  EC 81 MG  tablet Take 81 mg by mouth daily. Swallow whole. (Patient not taking: Reported on 01/09/2024)     No current facility-administered medications for this visit.    Allergies  Allergen Reactions   Ceftin  Other (See Comments)    Patient stated it caused sores in mouth Thrush   Cefuroxime  Axetil Other (See Comments)    sores in mouth Patient stated it caused sores in mouth Thrush    Cephalexin Other (See Comments)    Other     REVIEW OF SYSTEMS:   [X]  denotes positive finding, [ ]  denotes negative finding Cardiac  Comments:  Chest pain or chest pressure:    Shortness of breath upon exertion:    Short of breath when lying flat:    Irregular heart rhythm:        Vascular    Pain in calf, thigh, or hip brought on by ambulation:    Pain in feet at night that wakes you up from your sleep:     Blood clot in your veins:    Leg swelling:         Pulmonary    Oxygen at home:    Productive cough:     Wheezing:         Neurologic    Sudden weakness in arms or legs:     Sudden numbness in arms or legs:     Sudden onset of difficulty speaking or slurred speech:    Temporary loss of vision in one eye:     Problems with dizziness:         Gastrointestinal    Blood in stool:     Vomited blood:         Genitourinary    Burning when urinating:     Blood in urine:        Psychiatric    Major depression:         Hematologic  Bleeding problems:    Problems with blood clotting too easily:        Skin    Rashes or ulcers:        Constitutional    Fever or chills:      PHYSICAL EXAMINATION:  Vitals:   01/09/24 1231  BP: (!) 160/79  Pulse: 71  Temp: 97.9 F (36.6 C)  TempSrc: Temporal  Weight: 204 lb 4.8 oz (92.7 kg)  Height: 5' 11 (1.803 m)    General:  WDWN in NAD; vital signs documented above Gait: Not observed HENT: WNL, normocephalic Pulmonary: normal non-labored breathing Cardiac: regular HR Abdomen: soft, NT, no masses Skin: without  rashes Vascular Exam/Pulses: palpable ATA pulses BLE Extremities: without ischemic changes, without Gangrene , without cellulitis; without open wounds;  Musculoskeletal: no muscle wasting or atrophy  Neurologic: A&O X 3 Psychiatric:  The pt has Normal affect.   Non-Invasive Vascular Imaging:   Left SFA stents widely patent  ABI/TBIToday's ABIToday's TBIPrevious ABIPrevious TBI  +-------+-----------+-----------+------------+------------+  Right 1.04       0.79       1.09        0.77          +-------+-----------+-----------+------------+------------+  Left  1.14       0.74       1.16        0.74           ASSESSMENT/PLAN:: 73 y.o. male here for follow up for surveillance of PAD with history of left SFA stenting  Bilateral lower extremities well-perfused with palpable pedal pulses.  Left SFA stents are widely patent on duplex.  Claudication symptoms have resolved.  He has other complaints in his legs however he attributes these to neuropathy and history of 4 back surgeries.  Patient stopped his Plavix  and aspirin .  I recommended he at least resume his 81 mg of aspirin  daily.  Continue statin daily.  We will recheck left lower extremity arterial duplex and ABI in 1 year.   Donnice Sender, PA-C [facility] [phone]  Clinic MD:   Sheree

## 2024-01-22 ENCOUNTER — Other Ambulatory Visit (HOSPITAL_BASED_OUTPATIENT_CLINIC_OR_DEPARTMENT_OTHER): Payer: Self-pay | Admitting: Family Medicine

## 2024-01-22 DIAGNOSIS — E1143 Type 2 diabetes mellitus with diabetic autonomic (poly)neuropathy: Secondary | ICD-10-CM

## 2024-01-31 ENCOUNTER — Ambulatory Visit: Admitting: Pulmonary Disease

## 2024-02-07 LAB — HM DIABETES EYE EXAM

## 2024-02-11 ENCOUNTER — Encounter (HOSPITAL_BASED_OUTPATIENT_CLINIC_OR_DEPARTMENT_OTHER): Payer: Self-pay | Admitting: Family Medicine

## 2024-02-11 ENCOUNTER — Ambulatory Visit (HOSPITAL_BASED_OUTPATIENT_CLINIC_OR_DEPARTMENT_OTHER): Admitting: Family Medicine

## 2024-02-11 ENCOUNTER — Encounter (HOSPITAL_BASED_OUTPATIENT_CLINIC_OR_DEPARTMENT_OTHER): Payer: Self-pay | Admitting: *Deleted

## 2024-02-11 VITALS — BP 136/84 | HR 65 | Ht 71.0 in | Wt 197.7 lb

## 2024-02-11 DIAGNOSIS — E1143 Type 2 diabetes mellitus with diabetic autonomic (poly)neuropathy: Secondary | ICD-10-CM

## 2024-02-11 DIAGNOSIS — E785 Hyperlipidemia, unspecified: Secondary | ICD-10-CM

## 2024-02-11 DIAGNOSIS — R319 Hematuria, unspecified: Secondary | ICD-10-CM | POA: Diagnosis not present

## 2024-02-11 DIAGNOSIS — Z7984 Long term (current) use of oral hypoglycemic drugs: Secondary | ICD-10-CM

## 2024-02-11 DIAGNOSIS — E538 Deficiency of other specified B group vitamins: Secondary | ICD-10-CM

## 2024-02-11 DIAGNOSIS — E1169 Type 2 diabetes mellitus with other specified complication: Secondary | ICD-10-CM | POA: Diagnosis not present

## 2024-02-11 DIAGNOSIS — D1801 Hemangioma of skin and subcutaneous tissue: Secondary | ICD-10-CM | POA: Insufficient documentation

## 2024-02-11 LAB — POCT URINALYSIS DIP (CLINITEK)
Bilirubin, UA: NEGATIVE
Blood, UA: NEGATIVE
Glucose, UA: NEGATIVE mg/dL
Ketones, POC UA: NEGATIVE mg/dL
Leukocytes, UA: NEGATIVE
Nitrite, UA: NEGATIVE
POC PROTEIN,UA: >=300 — AB
Spec Grav, UA: 1.025 (ref 1.010–1.025)
Urobilinogen, UA: 0.2 U/dL
pH, UA: 5 (ref 5.0–8.0)

## 2024-02-11 NOTE — Assessment & Plan Note (Addendum)
 Used the bathroom during the night Saturday, noted urine was dark reddish, saw small clumps in the urine, presumed to be blood clots. Continued to notice this through the weekend, but was lessening. Normal appearing urine today. Reports urinating normally otherwise, no change in frequency or quantity. Mild dysuria early on with symptoms, nothing presently in this regard. Has had some nausea recently. No fever. On exam,  Urine dip in office with protein noted, negative for blood, nitrite, leukocyte esterase. We will send urine for UA, urine ACR. Low likelihood of infection given history and urine testing Will place referral to Urology. Patient is higher risk given age and history of tobacco use - quit 25 years ago, smoked for 25 years, about 1 ppd

## 2024-02-11 NOTE — Patient Instructions (Signed)
  Medication Instructions:  Your physician recommends that you continue on your current medications as directed. Please refer to the Current Medication list given to you today. --If you need a refill on any your medications before your next appointment, please call your pharmacy first. If no refills are authorized on file call the office.-- Lab Work: Your physician has recommended that you have lab work today: today If you have labs (blood work) drawn today and your tests are completely normal, you will receive your results via MyChart message OR a phone call from our staff.  Please ensure you check your voicemail in the event that you authorized detailed messages to be left on a delegated number. If you have any lab test that is abnormal or we need to change your treatment, we will call you to review the results.   Follow-Up: Your next appointment:   Your physician recommends that you schedule a follow-up appointment in: keep appt with Dr. de Peru  You will receive a text message or e-mail with a link to a survey about your care and experience with us  today! We would greatly appreciate your feedback!   Thanks for letting us  be apart of your health journey!!  Primary Care and Sports Medicine   Dr. Quintin sheerer Peru   We encourage you to activate your patient portal called MyChart.  Sign up information is provided on this After Visit Summary.  MyChart is used to connect with patients for Virtual Visits (Telemedicine).  Patients are able to view lab/test results, encounter notes, upcoming appointments, etc.  Non-urgent messages can be sent to your provider as well. To learn more about what you can do with MyChart, please visit --  [email or web address].

## 2024-02-11 NOTE — Assessment & Plan Note (Signed)
 Patient at risk for B12 deficiency with metformin  use, will check with labs today

## 2024-02-11 NOTE — Assessment & Plan Note (Signed)
 Patient continues with statin, however has been taking about every other day to try to limit potential side effects related to medication. She is currently fasting and would like to proceed with labs at this time for monitoring

## 2024-02-11 NOTE — Progress Notes (Signed)
    Procedures performed today:    None.  Independent interpretation of notes and tests performed by another provider:   None.  Brief History, Exam, Impression, and Recommendations:    BP 136/84 (BP Location: Right Arm, Patient Position: Sitting, Cuff Size: Normal)   Pulse 65   Ht 5' 11 (1.803 m)   Wt 197 lb 11.2 oz (89.7 kg)   SpO2 96%   BMI 27.57 kg/m   Hematuria, unspecified type Assessment & Plan: Used the bathroom during the night Saturday, noted urine was dark reddish, saw small clumps in the urine, presumed to be blood clots. Continued to notice this through the weekend, but was lessening. Normal appearing urine today. Reports urinating normally otherwise, no change in frequency or quantity. Mild dysuria early on with symptoms, nothing presently in this regard. Has had some nausea recently. No fever. On exam,  Urine dip in office with protein noted, negative for blood, nitrite, leukocyte esterase. We will send urine for UA, urine ACR. Low likelihood of infection given history and urine testing Will place referral to Urology. Patient is higher risk given age and history of tobacco use - quit 25 years ago, smoked for 25 years, about 1 ppd  Orders: -     POCT URINALYSIS DIP (CLINITEK) -     Urinalysis, Routine w reflex microscopic -     CBC with Differential/Platelet -     Comprehensive metabolic panel with GFR -     Ambulatory referral to Urology  Type II diabetes mellitus with peripheral autonomic neuropathy (HCC) -     Microalbumin / creatinine urine ratio -     CBC with Differential/Platelet -     Comprehensive metabolic panel with GFR  B12 deficiency Assessment & Plan: Patient at risk for B12 deficiency with metformin  use, will check with labs today  Orders: -     Vitamin B12  Hyperlipidemia associated with type 2 diabetes mellitus (HCC) Assessment & Plan: Patient continues with statin, however has been taking about every other day to try to limit potential  side effects related to medication. She is currently fasting and would like to proceed with labs at this time for monitoring  Orders: -     Lipid panel  Return if symptoms worsen or fail to improve.   ___________________________________________ Jeff Andreoni de Peru, MD, ABFM, [facility] Primary Care and Sports Medicine [facility]

## 2024-02-12 LAB — LIPID PANEL
Chol/HDL Ratio: 2.9 ratio (ref 0.0–5.0)
Cholesterol, Total: 108 mg/dL (ref 100–199)
HDL: 37 mg/dL — ABNORMAL LOW (ref >39)
LDL Chol Calc (NIH): 46 mg/dL (ref 0–99)
Triglycerides: 148 mg/dL (ref 0–149)
VLDL Cholesterol Cal: 25 mg/dL (ref 5–40)

## 2024-02-12 LAB — URINALYSIS, ROUTINE W REFLEX MICROSCOPIC
Bilirubin, UA: NEGATIVE
Glucose, UA: NEGATIVE
Ketones, UA: NEGATIVE
Leukocytes,UA: NEGATIVE
Nitrite, UA: NEGATIVE
RBC, UA: NEGATIVE
Specific Gravity, UA: >=1.03 — AB (ref 1.005–1.030)
Urobilinogen, Ur: 0.2 mg/dL (ref 0.2–1.0)
pH, UA: 5.5 (ref 5.0–7.5)

## 2024-02-12 LAB — CBC WITH DIFFERENTIAL/PLATELET
Basophils Absolute: 0 x10E3/uL (ref 0.0–0.2)
Basos: 1 %
EOS (ABSOLUTE): 0.2 x10E3/uL (ref 0.0–0.4)
Eos: 3 %
Hematocrit: 46.7 % (ref 37.5–51.0)
Hemoglobin: 15.5 g/dL (ref 13.0–17.7)
Immature Grans (Abs): 0 x10E3/uL (ref 0.0–0.1)
Immature Granulocytes: 0 %
Lymphocytes Absolute: 1.3 x10E3/uL (ref 0.7–3.1)
Lymphs: 23 %
MCH: 33 pg (ref 26.6–33.0)
MCHC: 33.2 g/dL (ref 31.5–35.7)
MCV: 99 fL — ABNORMAL HIGH (ref 79–97)
Monocytes Absolute: 0.5 x10E3/uL (ref 0.1–0.9)
Monocytes: 9 %
Neutrophils Absolute: 3.6 x10E3/uL (ref 1.4–7.0)
Neutrophils: 63 %
Platelets: 165 x10E3/uL (ref 150–450)
RBC: 4.7 x10E6/uL (ref 4.14–5.80)
RDW: 11.8 % (ref 11.6–15.4)
WBC: 5.7 x10E3/uL (ref 3.4–10.8)

## 2024-02-12 LAB — COMPREHENSIVE METABOLIC PANEL WITH GFR
ALT: 23 IU/L (ref 0–44)
AST: 26 IU/L (ref 0–40)
Albumin: 4.7 g/dL (ref 3.8–4.8)
Alkaline Phosphatase: 78 IU/L (ref 44–121)
BUN/Creatinine Ratio: 18 (ref 10–24)
BUN: 22 mg/dL (ref 8–27)
Bilirubin Total: 1.3 mg/dL — ABNORMAL HIGH (ref 0.0–1.2)
CO2: 17 mmol/L — ABNORMAL LOW (ref 20–29)
Calcium: 9 mg/dL (ref 8.6–10.2)
Chloride: 106 mmol/L (ref 96–106)
Creatinine, Ser: 1.23 mg/dL (ref 0.76–1.27)
Globulin, Total: 2.2 g/dL (ref 1.5–4.5)
Glucose: 140 mg/dL — ABNORMAL HIGH (ref 70–99)
Potassium: 4.2 mmol/L (ref 3.5–5.2)
Sodium: 144 mmol/L (ref 134–144)
Total Protein: 6.9 g/dL (ref 6.0–8.5)
eGFR: 63 mL/min/1.73 (ref >59)

## 2024-02-12 LAB — MICROALBUMIN / CREATININE URINE RATIO
Creatinine, Urine: 178.7 mg/dL
Microalb/Creat Ratio: 223 mg/g{creat} — ABNORMAL HIGH (ref 0–29)
Microalbumin, Urine: 399.3 ug/mL

## 2024-02-12 LAB — MICROSCOPIC EXAMINATION
Bacteria, UA: NONE SEEN
Casts: NONE SEEN /LPF
RBC, Urine: NONE SEEN /HPF (ref 0–2)
WBC, UA: NONE SEEN /HPF (ref 0–5)

## 2024-02-12 LAB — VITAMIN B12: Vitamin B-12: 331 pg/mL (ref 232–1245)

## 2024-02-20 ENCOUNTER — Other Ambulatory Visit: Payer: Self-pay | Admitting: Internal Medicine

## 2024-02-21 ENCOUNTER — Other Ambulatory Visit (HOSPITAL_BASED_OUTPATIENT_CLINIC_OR_DEPARTMENT_OTHER): Payer: Self-pay | Admitting: Family Medicine

## 2024-02-21 DIAGNOSIS — E1143 Type 2 diabetes mellitus with diabetic autonomic (poly)neuropathy: Secondary | ICD-10-CM

## 2024-02-26 ENCOUNTER — Other Ambulatory Visit: Payer: Self-pay

## 2024-02-26 MED ORDER — PANTOPRAZOLE SODIUM 40 MG PO TBEC: DELAYED_RELEASE_TABLET | ORAL | 0 refills | Status: DC

## 2024-02-26 NOTE — Telephone Encounter (Signed)
 Inbound call from patient requesting medication refill for Protonix . Patient scheduled for next available ov with pa. Please advise.   Thank you

## 2024-02-27 ENCOUNTER — Ambulatory Visit (HOSPITAL_BASED_OUTPATIENT_CLINIC_OR_DEPARTMENT_OTHER): Payer: Self-pay | Admitting: Family Medicine

## 2024-03-10 ENCOUNTER — Encounter (HOSPITAL_BASED_OUTPATIENT_CLINIC_OR_DEPARTMENT_OTHER): Payer: Self-pay | Admitting: Family Medicine

## 2024-03-10 ENCOUNTER — Ambulatory Visit (HOSPITAL_BASED_OUTPATIENT_CLINIC_OR_DEPARTMENT_OTHER): Admitting: Family Medicine

## 2024-03-10 VITALS — BP 143/70 | HR 64 | Ht 71.0 in | Wt 200.7 lb

## 2024-03-10 DIAGNOSIS — E538 Deficiency of other specified B group vitamins: Secondary | ICD-10-CM

## 2024-03-10 DIAGNOSIS — M51369 Other intervertebral disc degeneration, lumbar region without mention of lumbar back pain or lower extremity pain: Secondary | ICD-10-CM | POA: Diagnosis not present

## 2024-03-10 DIAGNOSIS — R319 Hematuria, unspecified: Secondary | ICD-10-CM

## 2024-03-10 DIAGNOSIS — E1143 Type 2 diabetes mellitus with diabetic autonomic (poly)neuropathy: Secondary | ICD-10-CM | POA: Diagnosis not present

## 2024-03-10 DIAGNOSIS — Z7984 Long term (current) use of oral hypoglycemic drugs: Secondary | ICD-10-CM

## 2024-03-10 LAB — POCT GLYCOSYLATED HEMOGLOBIN (HGB A1C)
HbA1c POC (<> result, manual entry): 7.8 % (ref 4.0–5.6)
Hemoglobin A1C: 7.8 % — AB (ref 4.0–5.6)

## 2024-03-10 NOTE — Assessment & Plan Note (Signed)
 Hemoglobin A1c has been gradually improving with lifestyle modifications.  He does continue with glipizide  as well as metformin .  Ultimately, he is wanting to reduce medications being taken, but is also aware of need for appropriately controlled A1c. At this time, can continue with current medication regimen as well as continue focus on lifestyle modifications. Point-of-care A1c completed in office today, continues to improve but still above goal at 7.8%.

## 2024-03-10 NOTE — Progress Notes (Signed)
    Procedures performed today:    None.  Independent interpretation of notes and tests performed by another provider:   None.  Brief History, Exam, Impression, and Recommendations:    BP (!) 143/70 (BP Location: Right Arm, Patient Position: Sitting, Cuff Size: Normal)   Pulse 64   Ht 5' 11 (1.803 m)   Wt 200 lb 11.2 oz (91 kg)   SpO2 95%   BMI 27.99 kg/m   Type II diabetes mellitus with peripheral autonomic neuropathy (HCC) Assessment & Plan: Hemoglobin A1c has been gradually improving with lifestyle modifications.  He does continue with glipizide  as well as metformin .  Ultimately, he is wanting to reduce medications being taken, but is also aware of need for appropriately controlled A1c. At this time, can continue with current medication regimen as well as continue focus on lifestyle modifications. Point-of-care A1c completed in office today, continues to improve but still above goal at 7.8%.  Orders: -     POCT glycosylated hemoglobin (Hb A1C)  B12 deficiency Assessment & Plan: At risk for B12 deficiency with long-term metformin  use.  B12 checked on recent labs and was within normal range.  Can continue to monitor intermittently moving forward   Degeneration of intervertebral disc of lumbar region, unspecified whether pain present Assessment & Plan: Patient with chronic back pain and has had prior back surgeries/fusion.  He has had recent increase in ongoing low back pain and has looked to schedule office visit with spine surgeon.  Unfortunately, next appointment available with spine surgeon is a couple months from now, he was able to get in sooner with PA for spine surgeon.  This appointment is next week.  Recommend continuing with scheduled appointment.   Hematuria, unspecified type Assessment & Plan: Patient with previously noted gross hematuria.  He did have evaluation with urology including additional labs as well as CT scan.  He reports that findings from both of  these were normal.  He is scheduled for cystoscopy, this is in the coming weeks.  Recommend continuing with planned evaluation in order to ensure no significant abnormality present for observed hematuria.   Return in about 3 months (around 06/09/2024).   ___________________________________________ Kainoa Swoboda de Peru, MD, ABFM, CAQSM Primary Care and Sports Medicine [facility]

## 2024-03-10 NOTE — Assessment & Plan Note (Signed)
 Patient with previously noted gross hematuria.  He did have evaluation with urology including additional labs as well as CT scan.  He reports that findings from both of these were normal.  He is scheduled for cystoscopy, this is in the coming weeks.  Recommend continuing with planned evaluation in order to ensure no significant abnormality present for observed hematuria.

## 2024-03-10 NOTE — Assessment & Plan Note (Signed)
 Patient with chronic back pain and has had prior back surgeries/fusion.  He has had recent increase in ongoing low back pain and has looked to schedule office visit with spine surgeon.  Unfortunately, next appointment available with spine surgeon is a couple months from now, he was able to get in sooner with PA for spine surgeon.  This appointment is next week.  Recommend continuing with scheduled appointment.

## 2024-03-10 NOTE — Patient Instructions (Signed)
  Medication Instructions:  Your physician recommends that you continue on your current medications as directed. Please refer to the Current Medication list given to you today. --If you need a refill on any your medications before your next appointment, please call your pharmacy first. If no refills are authorized on file call the office.--   Follow-Up: Your next appointment:   Your physician recommends that you schedule a follow-up appointment in: 3 months follow up  with Dr. de [place]  You will receive a text message or e-mail with a link to a survey about your care and experience with [place] today! We would greatly appreciate your feedback!   Thanks for letting us be apart of your health journey!!  Primary Care and Sports Medicine   Dr. Ceasar Mons [place]   We encourage you to activate your patient portal called "MyChart".  Sign up information is provided on this After Visit Summary.  MyChart is used to connect with patients for Virtual Visits (Telemedicine).  Patients are able to view lab/test results, encounter notes, upcoming appointments, etc.  Non-urgent messages can be sent to your provider as well. To learn more about what you can do with MyChart, please visit --  [email or web address].

## 2024-03-10 NOTE — Assessment & Plan Note (Signed)
 At risk for B12 deficiency with long-term metformin  use.  B12 checked on recent labs and was within normal range.  Can continue to monitor intermittently moving forward

## 2024-03-18 ENCOUNTER — Encounter: Payer: Self-pay | Admitting: Physical Medicine and Rehabilitation

## 2024-03-18 ENCOUNTER — Ambulatory Visit: Admitting: Physical Medicine and Rehabilitation

## 2024-03-18 ENCOUNTER — Other Ambulatory Visit (INDEPENDENT_AMBULATORY_CARE_PROVIDER_SITE_OTHER): Payer: Self-pay

## 2024-03-18 DIAGNOSIS — M961 Postlaminectomy syndrome, not elsewhere classified: Secondary | ICD-10-CM

## 2024-03-18 DIAGNOSIS — G8929 Other chronic pain: Secondary | ICD-10-CM | POA: Diagnosis not present

## 2024-03-18 DIAGNOSIS — M7918 Myalgia, other site: Secondary | ICD-10-CM

## 2024-03-18 DIAGNOSIS — M545 Low back pain, unspecified: Secondary | ICD-10-CM

## 2024-03-18 MED ORDER — TIZANIDINE HCL 4 MG PO TABS: 4.0000 mg | ORAL_TABLET | Freq: Every evening | ORAL | 0 refills | Status: AC | PRN

## 2024-03-18 MED ORDER — HYDROCODONE-ACETAMINOPHEN 5-325 MG PO TABS: 1.0000 | ORAL_TABLET | Freq: Three times a day (TID) | ORAL | 0 refills | Status: DC | PRN

## 2024-03-18 NOTE — Progress Notes (Unsigned)
**Note Jeff-Identified via Obfuscation**  Jeff Lopez - 73 y.o. male MRN [id number]  Date of birth: Jan 16, 1951  Office Visit Note: Visit Date: 03/18/2024 PCP: Jeff Lopez Referred by: Jeff Lopez  Subjective: Chief Complaint  Patient presents with   Lower Back - Pain   HPI: Jeff Lopez is a 73 y.o. male who comes in today as a self referral for evaluation of acute on chronic bilateral lower back pain radiating to hips. Also reports acute onset of headaches that he associates with his lower back pain. He is previous patient of Jeff Lopez. He reports history of chronic lower back pain, his pain increased about a month ago while cleaning cabinets. His pain worsens when raising up to get out of bed. He describes pain as sharp and sore, currently rates as 8 out of 10. Some relief of pain with home exercise regimen, rest and use of medications. He tried to use heating pad, however had to stop as heat was making him nauseous. Lumbar MRI imaging from 2016 shows interval left L1-L2 foraminal and lateral partial discectomy denuded aggressive L1-L2 disc degeneration with collapse of the disc space and grade I retrolisthesis. Unchanged L2 through S1 posterior lumbar interbody fusion. He underwent a total of (4) lumbar surgeries with Dr. Better. He was ultimately fused from T12 through the sacrum. He is concerned today that a screw is loose. He feels something terrible has happened. Patient denies focal weakness, numbness and tingling. No recent trauma or falls.       Review of Systems  Musculoskeletal:  Positive for back pain.  Neurological:  Negative for tingling, sensory change, focal weakness and weakness.  All other systems reviewed and are negative.  Otherwise per HPI.  Assessment & Plan: Visit Diagnoses:    ICD-10-CM   1. Chronic bilateral low back pain without sciatica  M54.50 XR Lumbar Spine 2-3 Views   G89.29 MR LUMBAR SPINE WO CONTRAST    Ambulatory referral to Orthopedic Surgery    2.  Myofascial pain syndrome  M79.18 MR LUMBAR SPINE WO CONTRAST    Ambulatory referral to Orthopedic Surgery    3. Post laminectomy syndrome  M96.1 XR Lumbar Spine 2-3 Views    MR LUMBAR SPINE WO CONTRAST    Ambulatory referral to Orthopedic Surgery       Plan: Findings:  Acute on chronic bilateral lower back pain radiating to hips. No radicular pain down the legs. Patient continues to have severe pain despite good conservative therapies such as home exercise regimen, rest and use of medications. Patients clinical presentation and exam are consistent with acute lumbar myofascial strain. There is myofascial tenderness noted upon palpation of bilateral lumbar paraspinal regions. I obtained lumbar radiographs in the office today that show posterior fusion from T12 to S1. There is no lucency noted around pedicle screws. We discussed treatment plan in detail today. Next step is to place order for lumbar MRI imaging. I also placed referral to our spine surgeon Jeff Lopez for further evaluation of hardware. We also discussed medication management and I prescribed Zanaflex  and Norco. He has no questions at this time. No red flag symptoms noted upon exam today.     Meds & Orders:  Meds ordered this encounter  Medications   tiZANidine  (ZANAFLEX ) 4 MG tablet    Sig: Take 1 tablet (4 mg total) by mouth at bedtime as needed for muscle spasms.    Dispense:  30 tablet    Refill:  0  HYDROcodone -acetaminophen  (NORCO/VICODIN) 5-325 MG tablet    Sig: Take 1 tablet by mouth every 8 (eight) hours as needed.    Dispense:  20 tablet    Refill:  0    Orders Placed This Encounter  Procedures   XR Lumbar Spine 2-3 Views   MR LUMBAR SPINE WO CONTRAST   Ambulatory referral to Orthopedic Surgery    Follow-up: Return Follow up with Dr. Georgina..   Procedures: No procedures performed      Clinical History: No specialty comments available.   He reports that he quit smoking about 15 years ago. His smoking  use included cigarettes. He started smoking about 52 years ago. He has a 37 pack-year smoking history. He has been exposed to tobacco smoke. He has never used smokeless tobacco.  Recent Labs    11/30/23 0957 03/10/24 1033  HGBA1C 8.6* 7.8  7.8*    Objective:  VS:  HT:    WT:   BMI:     BP:   HR: bpm  TEMP: ( )  RESP:  Physical Exam Vitals and nursing note reviewed.  HENT:     Head: Normocephalic and atraumatic.     Right Ear: External ear normal.     Left Ear: External ear normal.     Nose: Nose normal.     Mouth/Throat:     Mouth: Mucous membranes are moist.  Eyes:     Extraocular Movements: Extraocular movements intact.  Cardiovascular:     Rate and Rhythm: Normal rate.     Pulses: Normal pulses.  Pulmonary:     Effort: Pulmonary effort is normal.  Abdominal:     General: Abdomen is flat. There is no distension.  Musculoskeletal:        General: Tenderness present.     Cervical back: Normal range of motion.     Comments: Patient is slow to rise from seated position to standing. Mild pain noted with facet loading. 5/5 strength noted with bilateral hip flexion, knee flexion/extension, ankle dorsiflexion/plantarflexion and EHL. No clonus noted bilaterally. No pain upon palpation of greater trochanters. No pain with internal/external rotation of bilateral hips. Sensation intact bilaterally. Myofascial tenderness noted upon palpation of bilateral lumbar paraspinal regions. Negative slump test bilaterally. Ambulates without aid, gait steady.     Skin:    General: Skin is warm and dry.     Capillary Refill: Capillary refill takes less than 2 seconds.  Neurological:     General: No focal deficit present.     Mental Status: He is alert and oriented to person, place, and time.  Psychiatric:        Mood and Affect: Mood normal.        Behavior: Behavior normal.     Ortho Exam  Imaging: XR Lumbar Spine 2-3 Views Result Date: 03/18/2024 AP and lateral radiographs of lumbar  spine show posterior fusion from T12 through S1. There is no lucency noted to pedicle screws. No fractures.    Past Medical/Family/Surgical/Social History: Medications & Allergies reviewed per EMR, new medications updated. Patient Active Problem List   Diagnosis Date Noted   Hemangioma of skin and subcutaneous tissue 02/11/2024   Hematuria 02/11/2024   B12 deficiency 12/07/2023   Moderately increased albuminuria 07/19/2023   Basal cell carcinoma (BCC) of left forearm 06/08/2023   BCC (basal cell carcinoma), back 06/08/2023   Sinusitis 06/01/2023   Sun-damaged skin 05/29/2023   Mild nasal congestion 05/29/2023   Chest pressure 08/22/2022   Dizziness 08/22/2022  Romberg's test positive 08/22/2022   PAD (peripheral artery disease) 07/03/2022   COVID-19 04/28/2022   Claudication 03/31/2022   Insomnia due to medical condition 01/24/2022   Pruritus 08/24/2020   OSA (obstructive sleep apnea) 11/25/2018   Non-restorative sleep 11/25/2018   Malignant neoplasm of ascending colon (HCC) 05/16/2018   Low testosterone  03/02/2018   Gynecomastia 02/28/2018   Left lower quadrant pain 12/26/2017   Arthralgia 10/03/2017   Numbness 10/03/2017   Chronic pansinusitis 01/09/2017   Deviated septum 01/09/2017   Chronic left sacroiliac pain 10/19/2016   Chronic left-sided low back pain with left-sided sciatica 07/31/2016   Spinal stenosis, lumbar region, with neurogenic claudication 04/21/2015    Class: Chronic   Spondylolisthesis of lumbar region 04/21/2015    Class: Chronic   Hypothyroidism following radioiodine therapy 03/10/2015   Screening for prostate cancer 03/10/2015   Hemochromatosis 03/27/2014   Choledocholithiasis 07/14/2013   Nonspecific (abnormal) findings on radiological and other examination of biliary tract 07/13/2013   Calculus of gallbladder with acute cholecystitis 07/13/2013   Calculus of bile duct 07/13/2013   Abdominal pain 07/12/2013   Neck pain on left side 05/30/2013    Quit smoking 11/28/2012   Postoperative CSF leak 08/10/2012    Class: Acute   Degenerative disc disease, lumbar 08/06/2012    Class: Chronic   HNP (herniated nucleus pulposus), lumbar 08/06/2012    Class: Acute   Type II diabetes mellitus with peripheral autonomic neuropathy (HCC) 06/10/2012   COPD GOLD 0 02/09/2012   Chest pain at rest 02/06/2012   Mixed allergic and non-allergic rhinitis 02/06/2012   Wellness examination 08/12/2011   Radiculopathy of leg 11/05/2010   Cough 08/23/2010   RENAL CYST 05/27/2010   LUMBAR DISC DISORDER 05/27/2010   Hypertension 04/19/2010   CAD, NATIVE VESSEL 04/19/2010   Hyperlipidemia associated with type 2 diabetes mellitus (HCC) 04/09/2007   GERD 04/09/2007   Spondylosis 04/09/2007   Past Medical History:  Diagnosis Date   Allergy    per pt   CAD, NATIVE VESSEL 04/19/2010   nonobstructive by cath 2007:  oLAD 20-30%, mLAD 50%, pCFX 20-30%, oAVCFX 20-30%, L renal art 50%;  normal LVF   Cataract    bil cataracts removed   COPD (chronic obstructive pulmonary disease) (HCC)    Diabetes mellitus without complication (HCC)    GERD 04/09/2007   Headache(784.0)    HYPERLIPIDEMIA 04/09/2007   Patient denies.   HYPERTENSION, BENIGN 04/19/2010   Hypothyroidism    Hypothyroidism    previous hyperthyroidism, s/p I-131   LUMBAR DISC DISORDER 05/27/2010   Nerve damage    Left leg   OSTEOARTHRITIS, LUMBAR SPINE 04/09/2007   RENAL CYST 05/27/2010   Spinal headache    After having back surgery in 2014   Family History  Problem Relation Age of Onset   Breast cancer Mother    Uterine cancer Mother    Heart disease Mother    Heart attack Mother 28   Prostate cancer Father    Hypertension Brother    Non-Hodgkin's lymphoma Brother    Stomach cancer Paternal Grandmother    Thyroid  disease Neg Hx    Colon cancer Neg Hx    Esophageal cancer Neg Hx    Rectal cancer Neg Hx    Pancreatic cancer Neg Hx    Colon polyps Neg Hx    Past Surgical  History:  Procedure Laterality Date   ABDOMINAL AORTOGRAM W/LOWER EXTREMITY N/A 07/03/2022   Procedure: ABDOMINAL AORTOGRAM W/LOWER EXTREMITY;  Surgeon: Sheree Penne Bruckner,  Lopez;  Location: [facility];  Service: Cardiovascular;  Laterality: N/A;   ABDOMINAL AORTOGRAM W/LOWER EXTREMITY N/A 12/18/2022   Procedure: ABDOMINAL AORTOGRAM W/LOWER EXTREMITY;  Surgeon: Sheree Penne Bruckner, Lopez;  [facility]: [facility] INV[facility]IVE CV LAB;  Service: Cardiovascular;  Laterality: N/A;   BACK SURGERY  2012,2014   CARDIAC CATHETERIZATION  2007   Dr Kassie   CHOLECYSTECTOMY     CHOLECYSTECTOMY N/A 07/13/2013   Procedure: LAPAROSCOPIC CHOLECYSTECTOMY WITH INTRAOPERATIVE CHOLANGIOGRAM;  Surgeon: Alm VEAR Angle, Lopez;  Location: [facility] ORS;  Service: General;  Laterality: N/A;   COLONOSCOPY  2018   COLONOSCOPY W/ POLYPECTOMY     ERCP N/A 07/14/2013   Procedure: ENDOSCOPIC RETROGRADE CHOLANGIOPANCREATOGRAPHY (ERCP);  Surgeon: Toribio SHAUNNA Cedar, Lopez;  Location: [facility];  Service: Endoscopy;  Laterality: N/A;   EYE SURGERY Bilateral    Cataract removal   FOOT FRACTURE SURGERY     FRACTURE SURGERY     LUMBAR DISC SURGERY  08/06/2012   L3 & L4   LUMBAR DISC SURGERY  11/11/2015   L1 L2    DR NITKA   LUMBAR FUSION N/A 04/20/2015   Procedure: T12 to L1 fusion (Extension of Previous Fusion L2-S1 to T12-S1), Right Transforaminal lumbar interbody fusion, Posterior Fusion T12 to L1, with Pedicle screws, allograft, local bone graft, and  Vivigen;  Surgeon: Lynwood FORBES Better, Lopez;  Location: [facility] OR;  Service: Orthopedics;  Laterality: N/A;   LUMBAR LAMINECTOMY N/A 11/10/2013   Procedure: Left L1-2 far lateral approach to excise herniated nucleus pulposus;  Surgeon: Lynwood FORBES Better, Lopez;  Location: [facility] OR;  Service: Orthopedics;  Laterality: N/A;   LUMBAR LAMINECTOMY/DECOMPRESSION MICRODISCECTOMY Left 08/10/2012   Procedure: Dura Repair Left Side L2-L3;  Surgeon: Lynwood FORBES Better, Lopez;  Location: [facility] OR;  Service: Orthopedics;  Laterality:  Left;  Wilson Frame, Sliding table, dura repair kit, microscope   NECK SURGERY     X 2   PERIPHERAL VASCULAR INTERVENTION Left 07/03/2022   Procedure: PERIPHERAL VASCULAR INTERVENTION;  Surgeon: Sheree Penne Bruckner, Lopez;  Location: [facility] INVASIVE CV LAB;  Service: Cardiovascular;  Laterality: Left;  fem-pop   PERIPHERAL VASCULAR INTERVENTION  12/18/2022   Procedure: PERIPHERAL VASCULAR INTERVENTION;  Surgeon: Sheree Penne Bruckner, Lopez;  Location: [facility] INVASIVE CV LAB;  Service: Cardiovascular;;   SINOSCOPY     SPINE SURGERY  2006   C-spine surgery x 2   UPPER GASTROINTESTINAL ENDOSCOPY     Social History   Occupational History   Occupation: DISTRIBUTION [employer]    Employer: [employer]  Tobacco Use   Smoking status: Former    Current packs/day: 0.00    Average packs/day: 1 pack/day for 37.0 years (37.0 ttl pk-yrs)    Types: Cigarettes    Start date: 04/07/1971    Quit date: 04/06/2008    Years since quitting: 15.9    Passive exposure: Past   Smokeless tobacco: Never   Tobacco comments:    pt has stopped smoking about 9 months now  Vaping Use   Vaping status: Never Used  Substance and Sexual Activity   Alcohol use: No   Drug use: No   Sexual activity: Not Currently

## 2024-03-18 NOTE — Progress Notes (Unsigned)
 Pain Scale   Average Pain 8 Patient advising he has lower back pian radiating bilaterally to hips and legs pain is constant.        +Driver, -BT, -Dye Allergies.

## 2024-03-21 ENCOUNTER — Ambulatory Visit
Admission: RE | Admit: 2024-03-21 | Discharge: 2024-03-21 | Disposition: A | Discharge status: Home / Self Care | Source: Ambulatory Visit | Attending: Physical Medicine and Rehabilitation | Admitting: Physical Medicine and Rehabilitation

## 2024-03-21 DIAGNOSIS — G8929 Other chronic pain: Secondary | ICD-10-CM

## 2024-03-21 DIAGNOSIS — M961 Postlaminectomy syndrome, not elsewhere classified: Secondary | ICD-10-CM

## 2024-03-21 DIAGNOSIS — M7918 Myalgia, other site: Secondary | ICD-10-CM

## 2024-03-27 ENCOUNTER — Other Ambulatory Visit: Payer: Self-pay | Admitting: Family Medicine

## 2024-03-27 DIAGNOSIS — I10 Essential (primary) hypertension: Secondary | ICD-10-CM

## 2024-04-04 ENCOUNTER — Encounter: Payer: Self-pay | Admitting: Physical Medicine and Rehabilitation

## 2024-04-04 ENCOUNTER — Ambulatory Visit: Admitting: Physical Medicine and Rehabilitation

## 2024-04-04 DIAGNOSIS — G894 Chronic pain syndrome: Secondary | ICD-10-CM | POA: Diagnosis not present

## 2024-04-04 DIAGNOSIS — M961 Postlaminectomy syndrome, not elsewhere classified: Secondary | ICD-10-CM

## 2024-04-04 DIAGNOSIS — M545 Low back pain, unspecified: Secondary | ICD-10-CM | POA: Diagnosis not present

## 2024-04-04 NOTE — Progress Notes (Signed)
 Pain Scale   Average Pain 7  Patient advising he has lower back pain and her is her for MRI review.        +Driver, -BT, -Dye Allergies.

## 2024-04-04 NOTE — Progress Notes (Signed)
 Jeff Lopez - 73 y.o. male MRN [id number]  Date of birth: 04-20-1951  Office Visit Note: Visit Date: 04/04/2024 PCP: everitt Peru, Quintin PARAS, MD Referred by: de Peru, Raymond J, MD  Subjective: Chief Complaint  Patient presents with   Lower Back - Pain   HPI: Jeff Lopez is a 73 y.o. male who comes in today for evaluation of chronic, worsening and severe bilateral lower back pain, intermittent radiation of pain to hips. He is here today for lumbar MRI review. He is previous patient of Dr. Lynwood Better. His pain increased several weeks ago after cleaning cabinets. His pain worsens when getting out of bed in the morning. Prolonged sitting also bothers him. States his lower back is very sore when driving in car for prolonged periods. He describes pain as sharp and sore sensation, currently rates as 7 out of 10. Some relief of pain with formal physical therapy, home exercise regimen, rest and use of medications. Minimal relief of pain with Norco. Recent lumbar MRI imaging shows prior PLIF at T12 through S1. No residual spinal stenosis. There is residual left foraminal disc protrusion with endplate spurring at L5-S1, impinging upon the exiting left L5 nerve root in the left neural foramen. Appearance is similar as compared to prior MRI from 2016. He underwent multiple lumbar surgeries with Dr. Better. Patient denies focal weakness, numbness and tingling. No recent trauma or falls.   He is currently being treated by Dr. Elisabeth at [facility]. He reports recent issues with hematuria and enlarged prostate.      Review of Systems  Musculoskeletal:  Positive for back pain.  Neurological:  Negative for tingling, sensory change, focal weakness and weakness.  All other systems reviewed and are negative.  Otherwise per HPI.  Assessment & Plan: Visit Diagnoses:    ICD-10-CM   1. Chronic bilateral low back pain without sciatica  M54.50    G89.29     2. Post laminectomy syndrome  M96.1      3. Chronic pain syndrome  G89.4        Plan: Findings:  Chronic, worsening and severe bilateral lower back pain, intermittent radiation of pain to hips. Patient continues to have severe pain despite good conservative therapies such as home exercise regimen, rest and use of medications. He has attended multiple sessions of physical therapy in the past with minimal relief of pain. I discussed recent lumbar MRI with him today using imaging and spine model. No significant changes compared to prior imaging in 2016. Fusion is intact, no residual central canal stenosis. Patients clinical presentation and exam are complex. He has fairly large posterior fusion from T12 to S1. There is myofascial tenderness noted to bilateral lumbar paraspinal regions upon palpation today. It would be difficult to consider injection therapy due to his fusion hardware. I would like for patient to follow up with Dr. Georgina, he has appointment scheduled for 04/23/2024. Could look at chronic pain management and re-grouping with physical therapy. No red flag symptoms noted upon exam today.     Meds & Orders: No orders of the defined types were placed in this encounter.  No orders of the defined types were placed in this encounter.   Follow-up: Return for Follow up with Dr. Georgina.   Procedures: No procedures performed      Clinical History: CLINICAL DATA:  Initial evaluation for low back pain with radiation into the left lower extremity for 1 month.   EXAM: MRI LUMBAR SPINE WITHOUT CONTRAST  TECHNIQUE: Multiplanar, multisequence MR imaging of the lumbar spine was performed. No intravenous contrast was administered.   COMPARISON:  Prior MRI from 12/21/2014.   FINDINGS: Segmentation: Standard. Lowest well-formed disc space labeled the L5-S1 level.   Alignment: Sigmoid scoliosis with predominant left convex component. 3 mm anterolisthesis of L5 on S1. 2-3 mm retrolisthesis of T12 on L1-L1 on L2.   Vertebrae:  Postoperative changes from prior posterior decompression with fusion at T12 through S1. Vertebral body height maintained without acute or interval fracture. Bone marrow signal intensity within normal limits. No worrisome osseous lesions. No abnormal marrow edema.   Conus medullaris and cauda equina: Largely obscured by susceptibility artifact. Conus terminates near the level of T12-L1. Visualized distal cord and cauda equina within normal limits.   Paraspinal and other soft tissues: Chronic postoperative scarring within the posterior paraspinous soft tissues. Multiple T2 hyperintense right renal cyst, benign in appearance, no follow-up imaging recommended.   Disc levels:   T11-12: Seen only on sagittal projection. Negative interspace. Mild facet hypertrophy. No stenosis.   T12-L1: Degenerative intervertebral disc space narrowing with retrolisthesis. Diffuse disc bulge with reactive endplate spurring, asymmetric to the right. Prior posterior decompression with fusion. No residual spinal stenosis. Foramina appear patent.   L1-2: Prior PLIF. No residual spinal stenosis. Foramina appear patent.   L2-3: Prior PLIF. No residual spinal stenosis. Foramina appear patent.   L3-4: Prior PLIF. No residual spinal stenosis. Foramina appear patent.   L4-5: Prior PLIF. No residual spinal stenosis. Foramina widely patent.   L5-S1: Prior PLIF. No residual spinal stenosis. Residual left foraminal disc protrusion with endplate spurring impinges upon the exiting left L5 nerve root in the left neural foramen. Moderate to severe left foraminal stenosis (series 314, image 14). Right neural foramen widely patent.   IMPRESSION: 1. Prior PLIF at T12 through S1. No residual spinal stenosis. 2. Residual left foraminal disc protrusion with endplate spurring at L5-S1, impinging upon the exiting left L5 nerve root in the left neural foramen. Appearance is similar as compared to prior MRI  from 2016. 3. No other significant foraminal encroachment within the lumbar spine.     Electronically Signed   By: Morene Hoard M.D.   On: 03/22/2024 18:27   He reports that he quit smoking about 16 years ago. His smoking use included cigarettes. He started smoking about 53 years ago. He has a 37 pack-year smoking history. He has been exposed to tobacco smoke. He has never used smokeless tobacco.  Recent Labs    11/30/23 0957 03/10/24 1033  HGBA1C 8.6* 7.8  7.8*    Objective:  VS:  HT:    WT:   BMI:     BP:   HR: bpm  TEMP: ( )  RESP:  Physical Exam Vitals and nursing note reviewed.  HENT:     Head: Normocephalic and atraumatic.     Right Ear: External ear normal.     Left Ear: External ear normal.     Nose: Nose normal.     Mouth/Throat:     Mouth: Mucous membranes are moist.  Eyes:     Extraocular Movements: Extraocular movements intact.  Cardiovascular:     Rate and Rhythm: Normal rate.     Pulses: Normal pulses.  Pulmonary:     Effort: Pulmonary effort is normal.  Abdominal:     General: Abdomen is flat. There is no distension.  Musculoskeletal:        General: Tenderness present.     Cervical  back: Normal range of motion.     Comments: Patient is slow to rise from seated position to standing. Mild pain noted with facet loading. 5/5 strength noted with bilateral hip flexion, knee flexion/extension, ankle dorsiflexion/plantarflexion and EHL. No clonus noted bilaterally. No pain upon palpation of greater trochanters. No pain with internal/external rotation of bilateral hips. Sensation intact bilaterally. Myofascial tenderness noted upon palpation of bilateral lumbar paraspinal regions. Negative slump test bilaterally. Ambulates without aid, gait steady.       Skin:    General: Skin is warm and dry.     Capillary Refill: Capillary refill takes less than 2 seconds.  Neurological:     General: No focal deficit present.     Mental Status: He is alert and  oriented to person, place, and time.  Psychiatric:        Mood and Affect: Mood normal.        Behavior: Behavior normal.     Ortho Exam  Imaging: No results found.  Past Medical/Family/Surgical/Social History: Medications & Allergies reviewed per EMR, new medications updated. Patient Active Problem List   Diagnosis Date Noted   Hemangioma of skin and subcutaneous tissue 02/11/2024   Hematuria 02/11/2024   B12 deficiency 12/07/2023   Moderately increased albuminuria 07/19/2023   Basal cell carcinoma (BCC) of left forearm 06/08/2023   BCC (basal cell carcinoma), back 06/08/2023   Sinusitis 06/01/2023   Sun-damaged skin 05/29/2023   Mild nasal congestion 05/29/2023   Chest pressure 08/22/2022   Dizziness 08/22/2022   Romberg's test positive 08/22/2022   PAD (peripheral artery disease) 07/03/2022   COVID-19 04/28/2022   Claudication 03/31/2022   Insomnia due to medical condition 01/24/2022   Pruritus 08/24/2020   OSA (obstructive sleep apnea) 11/25/2018   Non-restorative sleep 11/25/2018   Malignant neoplasm of ascending colon (HCC) 05/16/2018   Low testosterone  03/02/2018   Gynecomastia 02/28/2018   Left lower quadrant pain 12/26/2017   Arthralgia 10/03/2017   Numbness 10/03/2017   Chronic pansinusitis 01/09/2017   Deviated septum 01/09/2017   Chronic left sacroiliac pain 10/19/2016   Chronic left-sided low back pain with left-sided sciatica 07/31/2016   Spinal stenosis, lumbar region, with neurogenic claudication 04/21/2015    Class: Chronic   Spondylolisthesis of lumbar region 04/21/2015    Class: Chronic   Hypothyroidism following radioiodine therapy 03/10/2015   Screening for prostate cancer 03/10/2015   Hemochromatosis 03/27/2014   Choledocholithiasis 07/14/2013   Nonspecific (abnormal) findings on radiological and other examination of biliary tract 07/13/2013   Calculus of gallbladder with acute cholecystitis 07/13/2013   Calculus of bile duct 07/13/2013    Abdominal pain 07/12/2013   Neck pain on left side 05/30/2013   Quit smoking 11/28/2012   Postoperative CSF leak 08/10/2012    Class: Acute   Degenerative disc disease, lumbar 08/06/2012    Class: Chronic   HNP (herniated nucleus pulposus), lumbar 08/06/2012    Class: Acute   Type II diabetes mellitus with peripheral autonomic neuropathy (HCC) 06/10/2012   COPD GOLD 0 02/09/2012   Chest pain at rest 02/06/2012   Mixed allergic and non-allergic rhinitis 02/06/2012   Wellness examination 08/12/2011   Radiculopathy of leg 11/05/2010   Cough 08/23/2010   RENAL CYST 05/27/2010   LUMBAR DISC DISORDER 05/27/2010   Hypertension 04/19/2010   CAD, NATIVE VESSEL 04/19/2010   Hyperlipidemia associated with type 2 diabetes mellitus (HCC) 04/09/2007   GERD 04/09/2007   Spondylosis 04/09/2007   Past Medical History:  Diagnosis Date   Allergy  per pt   CAD, NATIVE VESSEL 04/19/2010   nonobstructive by cath 2007:  oLAD 20-30%, mLAD 50%, pCFX 20-30%, oAVCFX 20-30%, L renal art 50%;  normal LVF   Cataract    bil cataracts removed   COPD (chronic obstructive pulmonary disease) (HCC)    Diabetes mellitus without complication (HCC)    GERD 04/09/2007   Headache(784.0)    HYPERLIPIDEMIA 04/09/2007   Patient denies.   HYPERTENSION, BENIGN 04/19/2010   Hypothyroidism    Hypothyroidism    previous hyperthyroidism, s/p I-131   LUMBAR DISC DISORDER 05/27/2010   Nerve damage    Left leg   OSTEOARTHRITIS, LUMBAR SPINE 04/09/2007   RENAL CYST 05/27/2010   Spinal headache    After having back surgery in 2014   Family History  Problem Relation Age of Onset   Breast cancer Mother    Uterine cancer Mother    Heart disease Mother    Heart attack Mother 31   Prostate cancer Father    Hypertension Brother    Non-Hodgkin's lymphoma Brother    Stomach cancer Paternal Grandmother    Thyroid  disease Neg Hx    Colon cancer Neg Hx    Esophageal cancer Neg Hx    Rectal cancer Neg Hx    Pancreatic  cancer Neg Hx    Colon polyps Neg Hx    Past Surgical History:  Procedure Laterality Date   ABDOMINAL AORTOGRAM W/LOWER EXTREMITY N/A 07/03/2022   Procedure: ABDOMINAL AORTOGRAM W/LOWER EXTREMITY;  Surgeon: Sheree Penne Bruckner, MD;  Location: [facility] INVASIVE CV LAB;  Service: Cardiovascular;  Laterality: N/A;   ABDOMINAL AORTOGRAM W/LOWER EXTREMITY N/A 12/18/2022   Procedure: ABDOMINAL AORTOGRAM W/LOWER EXTREMITY;  Surgeon: Sheree Penne Bruckner, MD;  Location: [facility] INVASIVE CV LAB;  Service: Cardiovascular;  Laterality: N/A;   BACK SURGERY  2012,2014   CARDIAC CATHETERIZATION  2007   Dr Kassie   CHOLECYSTECTOMY     CHOLECYSTECTOMY N/A 07/13/2013   Procedure: LAPAROSCOPIC CHOLECYSTECTOMY WITH INTRAOPERATIVE CHOLANGIOGRAM;  Surgeon: Alm VEAR Angle, MD;  Location: [facility] ORS;  Service: General;  Laterality: N/A;   COLONOSCOPY  2018   COLONOSCOPY W/ POLYPECTOMY     ERCP N/A 07/14/2013   Procedure: ENDOSCOPIC RETROGRADE CHOLANGIOPANCREATOGRAPHY (ERCP);  Surgeon: Toribio SHAUNNA Cedar, MD;  Location: [facility];  Service: Endoscopy;  Laterality: N/A;   EYE SURGERY Bilateral    Cataract removal   FOOT FRACTURE SURGERY     FRACTURE SURGERY     LUMBAR DISC SURGERY  08/06/2012   L3 & L4   LUMBAR DISC SURGERY  11/11/2015   L1 L2    DR NITKA   LUMBAR FUSION N/A 04/20/2015   Procedure: T12 to L1 fusion (Extension of Previous Fusion L2-S1 to T12-S1), Right Transforaminal lumbar interbody fusion, Posterior Fusion T12 to L1, with Pedicle screws, allograft, local bone graft, and  Vivigen;  Surgeon: Lynwood FORBES Better, MD;  Location: [facility] OR;  Service: Orthopedics;  Laterality: N/A;   LUMBAR LAMINECTOMY N/A 11/10/2013   Procedure: Left L1-2 far lateral approach to excise herniated nucleus pulposus;  Surgeon: Lynwood FORBES Better, MD;  Location: [facility];  Service: Orthopedics;  Laterality: N/A;   LUMBAR LAMINECTOMY/DECOMPRESSION MICRODISCECTOMY Left 08/10/2012   Procedure: Dura Repair Left Side L2-L3;  Surgeon: Lynwood FORBES Better,  MD;  Location: [facility];  Service: Orthopedics;  Laterality: Left;  Wilson Frame, Sliding table, dura repair kit, microscope   NECK SURGERY     X 2   PERIPHERAL VASCULAR INTERVENTION Left 07/03/2022   Procedure: PERIPHERAL  VASCULAR INTERVENTION;  Surgeon: Sheree Penne Bruckner, MD;  Location: [facility] INVASIVE CV LAB;  Service: Cardiovascular;  Laterality: Left;  fem-pop   PERIPHERAL VASCULAR INTERVENTION  12/18/2022   Procedure: PERIPHERAL VASCULAR INTERVENTION;  Surgeon: Sheree Penne Bruckner, MD;  Location: [facility] INVASIVE CV LAB;  Service: Cardiovascular;;   SINOSCOPY     SPINE SURGERY  2006   C-spine surgery x 2   UPPER GASTROINTESTINAL ENDOSCOPY     Social History   Occupational History   Occupation: DISTRIBUTION [employer]    Employer: [employer]  Tobacco Use   Smoking status: Former    Current packs/day: 0.00    Average packs/day: 1 pack/day for 37.0 years (37.0 ttl pk-yrs)    Types: Cigarettes    Start date: 04/07/1971    Quit date: 04/06/2008    Years since quitting: 16.0    Passive exposure: Past   Smokeless tobacco: Never   Tobacco comments:    pt has stopped smoking about 9 months now  Vaping Use   Vaping status: Never Used  Substance and Sexual Activity   Alcohol use: No   Drug use: No   Sexual activity: Not Currently

## 2024-04-23 ENCOUNTER — Other Ambulatory Visit: Payer: Self-pay

## 2024-04-23 ENCOUNTER — Ambulatory Visit: Admitting: Orthopedic Surgery

## 2024-04-23 VITALS — BP 146/69 | HR 61 | Ht 71.0 in | Wt 196.4 lb

## 2024-04-23 DIAGNOSIS — M545 Low back pain, unspecified: Secondary | ICD-10-CM | POA: Diagnosis not present

## 2024-04-23 NOTE — Progress Notes (Signed)
 Orthopedic Spine Surgery Office Note  Assessment: Patient is a 73 y.o. male with chronic low back pain.  No radicular symptoms.  Has a loose screw on the left at T12   Plan: -Patient has tried PT, muscle relaxers, home exercise program, steroid injections - Since patient's pain has gotten better and is back to his baseline, I did not recommend any further invention at this time -Would need an A1c of 7.5 or less prior to any elective spine surgery -Patient should return to office on as-needed basis   Patient expressed understanding of the plan and all questions were answered to the patient's satisfaction.   ___________________________________________________________________________   History:  Patient is a 73 y.o. male who presents today for lumbar spine.  Patient has had multiple lumbar spine surgeries over the years.  His last was with my retired partner, Dr. Lucilla.  He has had chronic back pain that is worse in the winter.  He feels the lower lumbar region.  He does not have any pain radiating into either lower extremity.  He noticed acute worsening of his back pain with the last couple of months.  He said he has not done this type of activity in a long time. Since that time, his pain has gotten significantly better and he is back to his baseline pain that he is used to tolerating and managing. No bowel or bladder symptoms. No saddle anesthesia.   Treatments tried: PT, muscle relaxers, home exercise program, steroid injections  Review of systems: Denies fevers and chills, night sweats, unexplained weight loss, history of cancer. Has had pain that wakes him at night  Past medical history: Chronic pain HTN GERD DM (last A1c was 7.8 on 03/10/2024) OSA COPD CAD  Allergies: ceftin , cephalexin, cefuroxime   Past surgical history:  T12-S1 posterior instrumented spinal, multilevel decompression Cholecystectomy Polypectomy Cataract surgery  Left foot surgery C5-7 ACDF  Social  history: Denies use of nicotine product (smoking, vaping, patches, smokeless) Alcohol use: Denies Denies recreational drug use   Physical Exam:  BMI of 27.4  General: no acute distress, appears stated age Neurologic: alert, answering questions appropriately, following commands Respiratory: unlabored breathing on room air, symmetric chest rise Psychiatric: appropriate affect, normal cadence to speech   MSK (spine):  -Strength exam      Left  Right EHL    5/5  5/5 TA    5/5  5/5 GSC    5/5  5/5 Knee extension  5/5  5/5 Hip flexion   5/5  5/5  -Sensory exam    Sensation intact to light touch in L3-S1 nerve distributions of bilateral lower extremities  -Achilles DTR: 1/4 on the left, 1/4 on the right -Patellar tendon DTR: 1/4 on the left, 1/4 on the right  -Straight leg raise: Negative bilaterally -Clonus: no beats bilaterally  -Left hip exam: No pain through range of motion -Right hip exam: No pain through range of motion  Imaging: XRs of the lumbar spine from 04/23/2024 were independently reviewed and interpreted, showing no evidence of instability on flexion/extension views.  There is a fusion construct from T12-S1.  No lucency seen around the screws.  None screws backed out.  There are interbody devices from L1/2 to the L5/S1 disc space.  Interbody's appear in appropriate position.  MRI of the lumbar spine from 03/21/2024 was independently reviewed and interpreted, showing no central or lateral recess stenosis.  Possible left-sided L5/S1 foraminal stenosis but artifact obscures view.  CT scan of the abdomen and pelvis from  01/2024 showed lucency around the left T12 screw on both the sagittal and coronal views.   Patient name: Jeff Lopez Patient MRN: [id number] Date of visit: 04/23/24

## 2024-04-24 ENCOUNTER — Other Ambulatory Visit: Payer: Self-pay | Admitting: Internal Medicine

## 2024-04-24 ENCOUNTER — Other Ambulatory Visit: Payer: Self-pay | Admitting: Family Medicine

## 2024-04-24 DIAGNOSIS — E890 Postprocedural hypothyroidism: Secondary | ICD-10-CM

## 2024-04-27 NOTE — Progress Notes (Unsigned)
 Jeff Console, PA-C [address] [place], [place]  [address] Phone: [phone]   Primary Care Physician: de [place], Quintin PARAS, MD  Primary Gastroenterologist:  Jeff Console, PA-C / Norleen Kiang, MD   Chief Complaint: Follow-up GERD and dysphagia       HPI:   73 year old male returns for annual follow-up of acid reflux.  Previous patient of Dr. Teressa.  Transferred care to Dr. Kiang.  He has history of acid reflux.  Needs medication refill of pantoprazole  40 mg once daily.  He last saw Dr. Teressa in our office 12/2021 for follow-up of GERD.  He is currently taking pantoprazole  40 mg once daily with good control of acid reflux.  If he misses the medication, then he has severe acid reflux which radiates to his chest and shoulder.  He has had some episodes of solid food dysphagia.  Feels like food gets stuck in his mid upper chest, particularly if he eats too fast.  Denies vomiting or food bolus episodes.  Has occasional intermittent abdominal cramping.  Has loose stools which he attributes to taking metformin  500 mg twice daily.  Denies hematochezia, melena, or unintentional weight loss.  No other GI symptoms or concerns.  GI History: 07/2008 EGD by Dr. Teressa:   2 cm hiatal hernia, no stricture, slightly irregular Z line that was biopsied and biopsies negative for intestinal metaplasia.      08/2016 colonoscopy: 6 small subcentimeter polyps removed.  5 adenomas.  10/2019 colonoscopy: 1 small tubular adenoma removed.  7-year repeat (due 10/2026).  Previous laparoscopic cholecystectomy 06/2013 for acute cholecystitis and cholelithiasis..  Current Outpatient Medications  Medication Sig Dispense Refill   amLODipine -olmesartan  (AZOR ) 5-40 MG tablet TAKE 1 TABLET BY MOUTH ONCE DAILY 90 tablet 0   aspirin  EC 81 MG tablet Take 81 mg by mouth daily. Swallow whole.     cetirizine (ZYRTEC) 10 MG chewable tablet Chew 10 mg by mouth daily.     clobetasol (TEMOVATE) 0.05 % external solution apply  1 application TO THE SCALP 2 TIMES DAILY x14 DAYS     eszopiclone  (LUNESTA ) 2 MG TABS tablet TAKE 1 TABLET BY MOUTH AT BEDTIME AS NEEDED FOR SLEEP 30 tablet 1   finasteride (PROSCAR) 5 MG tablet Take 5 mg by mouth daily.     glipiZIDE  (GLUCOTROL  XL) 10 MG 24 hr tablet TAKE 1 TABLET BY MOUTH DAILY WITH BREAKFAST 90 tablet 1   hydrocortisone 2.5 % cream Apply 1 Application topically 2 (two) times daily.     levothyroxine  (SYNTHROID ) 137 MCG tablet TAKE 1 TABLET BY MOUTH EVERY DAY BEFORE BREAKFAST 90 tablet 1   metFORMIN  (GLUCOPHAGE -XR) 500 MG 24 hr tablet TAKE 2 TABLETS BY MOUTH EVERY MORNING WITH BREAKFAST 180 tablet 1   metoprolol  succinate (TOPROL -XL) 50 MG 24 hr tablet TAKE 1 TABLET BY MOUTH EVERY DAY WITH OR immediately following A meal 90 tablet 2   pantoprazole  (PROTONIX ) 40 MG tablet TAKE 1 TABLET BY MOUTH 2 TIMES DAILY BEFORE A meal 180 tablet 0   rosuvastatin  (CRESTOR ) 20 MG tablet TAKE 1 TABLET BY MOUTH EVERY DAY (Patient taking differently: Take 20 mg by mouth every other day.) 90 tablet 1   tiZANidine  (ZANAFLEX ) 4 MG tablet Take 1 tablet (4 mg total) by mouth at bedtime as needed for muscle spasms. 30 tablet 0   No current facility-administered medications for this visit.    Allergies as of 04/28/2024 - Review Complete 04/28/2024  Allergen Reaction Noted  Ceftin  Other (See Comments) 07/31/2011   Cefuroxime  axetil Other (See Comments) 07/31/2011   Cephalexin Other (See Comments) 07/03/2018    Past Medical History:  Diagnosis Date   Allergy    per pt   CAD, NATIVE VESSEL 04/19/2010   nonobstructive by cath 2007:  oLAD 20-30%, mLAD 50%, pCFX 20-30%, oAVCFX 20-30%, L renal art 50%;  normal LVF   Cataract    bil cataracts removed   COPD (chronic obstructive pulmonary disease) (HCC)    Diabetes mellitus without complication (HCC)    Enlarged prostate    GERD 04/09/2007   Headache(784.0)    HYPERLIPIDEMIA 04/09/2007   Patient denies.   HYPERTENSION, BENIGN 04/19/2010    Hypothyroidism    Hypothyroidism    previous hyperthyroidism, s/p I-131   LUMBAR DISC DISORDER 05/27/2010   Nerve damage    Left leg   OSTEOARTHRITIS, LUMBAR SPINE 04/09/2007   RENAL CYST 05/27/2010   Spinal headache    After having back surgery in 2014    Past Surgical History:  Procedure Laterality Date   ABDOMINAL AORTOGRAM W/LOWER EXTREMITY N/A 07/03/2022   Procedure: ABDOMINAL AORTOGRAM W/LOWER EXTREMITY;  Surgeon: Sheree Penne Bruckner, MD;  Location: [facility] INVASIVE CV LAB;  Service: Cardiovascular;  Laterality: N/A;   ABDOMINAL AORTOGRAM W/LOWER EXTREMITY N/A 12/18/2022   Procedure: ABDOMINAL AORTOGRAM W/LOWER EXTREMITY;  Surgeon: Sheree Penne Bruckner, MD;  Location: [facility] INVASIVE CV LAB;  Service: Cardiovascular;  Laterality: N/A;   BACK SURGERY  2012,2014   CARDIAC CATHETERIZATION  2007   Dr Kassie   CHOLECYSTECTOMY     CHOLECYSTECTOMY N/A 07/13/2013   Procedure: LAPAROSCOPIC CHOLECYSTECTOMY WITH INTRAOPERATIVE CHOLANGIOGRAM;  Surgeon: Alm VEAR Angle, MD;  Location: [facility] ORS;  Service: General;  Laterality: N/A;   COLONOSCOPY  2018   COLONOSCOPY W/ POLYPECTOMY     ERCP N/A 07/14/2013   Procedure: ENDOSCOPIC RETROGRADE CHOLANGIOPANCREATOGRAPHY (ERCP);  Surgeon: Toribio SHAUNNA Cedar, MD;  Location: [facility];  Service: Endoscopy;  Laterality: N/A;   EYE SURGERY Bilateral    Cataract removal   FOOT FRACTURE SURGERY     FRACTURE SURGERY     LUMBAR DISC SURGERY  08/06/2012   L3 & L4   LUMBAR DISC SURGERY  11/11/2015   L1 L2    DR NITKA   LUMBAR FUSION N/A 04/20/2015   Procedure: T12 to L1 fusion (Extension of Previous Fusion L2-S1 to T12-S1), Right Transforaminal lumbar interbody fusion, Posterior Fusion T12 to L1, with Pedicle screws, allograft, local bone graft, and  Vivigen;  Surgeon: Lynwood FORBES Better, MD;  Location: [facility] OR;  Service: Orthopedics;  Laterality: N/A;   LUMBAR LAMINECTOMY N/A 11/10/2013   Procedure: Left L1-2 far lateral approach to excise herniated nucleus pulposus;   Surgeon: Lynwood FORBES Better, MD;  Location: [facility] OR;  Service: Orthopedics;  Laterality: N/A;   LUMBAR LAMINECTOMY/DECOMPRESSION MICRODISCECTOMY Left 08/10/2012   Procedure: Dura Repair Left Side L2-L3;  Surgeon: Lynwood FORBES Better, MD;  Location: [facility] OR;  Service: Orthopedics;  Laterality: Left;  Wilson Frame, Sliding table, dura repair kit, microscope   NECK SURGERY     X 2   PERIPHERAL VASCULAR INTERVENTION Left 07/03/2022   Procedure: PERIPHERAL VASCULAR INTERVENTION;  Surgeon: Sheree Penne Bruckner, MD;  Location: [facility] INVASIVE CV LAB;  Service: Cardiovascular;  Laterality: Left;  fem-pop   PERIPHERAL VASCULAR INTERVENTION  12/18/2022   Procedure: PERIPHERAL VASCULAR INTERVENTION;  Surgeon: Sheree Penne Bruckner, MD;  Location: [facility] INVASIVE CV LAB;  Service: Cardiovascular;;   SINOSCOPY     SPINE SURGERY  2006   C-spine surgery x 2   UPPER GASTROINTESTINAL ENDOSCOPY      Review of Systems:    All systems reviewed and negative except where noted in HPI.    Physical Exam:  BP 132/80 (BP Location: Left Arm, Patient Position: Sitting, Cuff Size: Normal)   Pulse 64   Ht 5' 8.25 (1.734 m) Comment: height measured without shoes  Wt 196 lb 2 oz (89 kg)   BMI 29.60 kg/m  No LMP for male patient.  General: Well-nourished, well-developed in no acute distress.  Lungs: Clear to auscultation bilaterally. Non-labored. Heart: Regular rate and rhythm, no murmurs rubs or gallops.  Abdomen: Bowel sounds are normal; Abdomen is Soft; No hepatosplenomegaly, masses or hernias;  No Abdominal Tenderness; No guarding or rebound tenderness. Neuro: Alert and oriented x 3.  Grossly intact.  Psych: Alert and cooperative, normal mood and affect.   Imaging Studies: No results found.  Labs: CBC    Component Value Date/Time   WBC 5.7 02/11/2024 1526   WBC 6.1 12/19/2022 0148   RBC 4.70 02/11/2024 1526   RBC 4.18 (L) 12/19/2022 0148   HGB 15.5 02/11/2024 1526   HGB 13.8 04/30/2014 1118   HCT 46.7 02/11/2024  1526   HCT 42.0 04/30/2014 1118   PLT 165 02/11/2024 1526   MCV 99 (H) 02/11/2024 1526   MCV 98.1 (H) 04/30/2014 1118   MCH 33.0 02/11/2024 1526   MCH 33.7 12/19/2022 0148   MCHC 33.2 02/11/2024 1526   MCHC 34.6 12/19/2022 0148   RDW 11.8 02/11/2024 1526   RDW 13.1 04/30/2014 1118   LYMPHSABS 1.3 02/11/2024 1526   LYMPHSABS 1.3 04/30/2014 1118   MONOABS 0.5 12/07/2021 1151   MONOABS 0.7 04/30/2014 1118   EOSABS 0.2 02/11/2024 1526   BASOSABS 0.0 02/11/2024 1526   BASOSABS 0.1 04/30/2014 1118    CMP     Component Value Date/Time   NA 144 02/11/2024 1526   K 4.2 02/11/2024 1526   CL 106 02/11/2024 1526   CO2 17 (L) 02/11/2024 1526   GLUCOSE 140 (H) 02/11/2024 1526   GLUCOSE 145 (H) 12/19/2022 0148   BUN 22 02/11/2024 1526   CREATININE 1.23 02/11/2024 1526   CREATININE 1.17 04/23/2020 0914   CALCIUM  9.0 02/11/2024 1526   PROT 6.9 02/11/2024 1526   ALBUMIN 4.7 02/11/2024 1526   AST 26 02/11/2024 1526   ALT 23 02/11/2024 1526   ALKPHOS 78 02/11/2024 1526   BILITOT 1.3 (H) 02/11/2024 1526   GFRNONAA 47 (L) 12/19/2022 0148   GFRAA 67 07/05/2017 1024     Assessment and Plan:   WILBORN MEMBRENO is a 73 y.o. y/o male presents for followup of:  1.  Chronic GERD - Continue pantoprazole  40 mg once daily, #90, 3 RF. - Recommended add OTC Pepcid  20mg  twice daily for breakthrough heartburn if needed. - Recommend Lifestyle Modifications to prevent Acid Reflux.  Rec. Avoid coffee, sodas, peppermint, garlic, onions, alcohol, citrus fruits, chocolate, tomatoes, fatty and spicey foods.  Avoid eating 2-3 hours before bedtime.   - We discussed adverse side effects of PPIs to include vitamin deficiencies, osteoporosis, and renal insufficiency.  Recommend take lowest effective dose of PPI necessary to control acid reflux.  OK to add H2RB (Pepcid  20mg  daily) or antiacid if needed for breakthrough acid reflux.   2.  Dysphagia  - Scheduling EGD with possible Dilation I discussed risks of  EGD with patient to include risk of bleeding, perforation, and risk of sedation.  Patient expressed understanding and agrees to proceed with EGD.   3.  History of adenomatous colon polyps:  Last colonoscopy in May 2021 showed one small precancerous polyp.  - 7-year repeat colonoscopy will be due 10/2026.  4.  Chronic loose stools likely adverse side effect of metformin  versus bile salt diarrhea postcholecystectomy.  Patient does not want any treatment for loose stool. - He will talk to his PCP about decreasing metformin  to 500 mg once daily dose to see if this helps. -If loose stools worsen or persist, consider trial of cholestyramine powder.    Jeff Console, PA-C  Follow up in 1 year to refill Protonix  medication.  Also follow-up based on EGD results.

## 2024-04-28 ENCOUNTER — Encounter: Payer: Self-pay | Admitting: Physician Assistant

## 2024-04-28 ENCOUNTER — Ambulatory Visit: Admitting: Physician Assistant

## 2024-04-28 VITALS — BP 132/80 | HR 64 | Ht 68.25 in | Wt 196.1 lb

## 2024-04-28 DIAGNOSIS — K219 Gastro-esophageal reflux disease without esophagitis: Secondary | ICD-10-CM | POA: Diagnosis not present

## 2024-04-28 DIAGNOSIS — R1310 Dysphagia, unspecified: Secondary | ICD-10-CM

## 2024-04-28 MED ORDER — PANTOPRAZOLE SODIUM 40 MG PO TBEC: 40.0000 mg | DELAYED_RELEASE_TABLET | Freq: Every day | ORAL | 3 refills | Status: AC

## 2024-04-28 NOTE — Patient Instructions (Addendum)
 We have sent the following medications to your pharmacy for you to pick up at your convenience: Pantoprazole  40 mg once daily   You have been scheduled for an Endoscopy. Please follow written instructions given to you at your visit today.  If you use inhalers (even only as needed), please bring them with you on the day of your procedure.  If you take any of the following medications, they will need to be adjusted prior to your procedure:   DO NOT TAKE 7 DAYS PRIOR TO TEST- Trulicity (dulaglutide) Ozempic , Wegovy  (semaglutide ) Mounjaro (tirzepatide) Bydureon Bcise (exanatide extended release)  DO NOT TAKE 1 DAY PRIOR TO YOUR TEST Rybelsus  (semaglutide ) Adlyxin (lixisenatide) Victoza (liraglutide) Byetta (exanatide) ___________________________________________________________________________  Please follow up sooner if symptoms increase or worsen  Due to recent changes in healthcare laws, you may see the results of your imaging and laboratory studies on MyChart before your provider has had a chance to review them.  We understand that in some cases there may be results that are confusing or concerning to you. Not all laboratory results come back in the same time frame and the provider may be waiting for multiple results in order to interpret others.  Please give us  48 hours in order for your provider to thoroughly review all the results before contacting the office for clarification of your results.   Thank you for trusting me with your gastrointestinal care!   Ellouise Console, PA-C _______________________________________________________  If your blood pressure at your visit was 140/90 or greater, please contact your primary care physician to follow up on this.  _______________________________________________________  If you are age 72 or older, your body mass index should be between 23-30. Your Body mass index is 29.6 kg/m. If this is out of the aforementioned range listed, please  consider follow up with your Primary Care Provider.  If you are age 72 or younger, your body mass index should be between 19-25. Your Body mass index is 29.6 kg/m. If this is out of the aformentioned range listed, please consider follow up with your Primary Care Provider.   ________________________________________________________  The [facility] providers would like to encourage you to use MYCHART to communicate with providers for non-urgent requests or questions.  Due to long hold times on the telephone, sending your provider a message by [facility] may be a faster and more efficient way to get a response.  Please allow 48 business hours for a response.  Please remember that this is for non-urgent requests.  _______________________________________________________

## 2024-04-28 NOTE — Progress Notes (Signed)
 Noted

## 2024-05-13 ENCOUNTER — Other Ambulatory Visit (HOSPITAL_BASED_OUTPATIENT_CLINIC_OR_DEPARTMENT_OTHER): Payer: Self-pay | Admitting: Family Medicine

## 2024-05-13 DIAGNOSIS — E1143 Type 2 diabetes mellitus with diabetic autonomic (poly)neuropathy: Secondary | ICD-10-CM

## 2024-05-14 ENCOUNTER — Other Ambulatory Visit: Payer: Self-pay | Admitting: Family Medicine

## 2024-06-05 ENCOUNTER — Encounter: Admitting: Internal Medicine

## 2024-06-09 ENCOUNTER — Ambulatory Visit (HOSPITAL_BASED_OUTPATIENT_CLINIC_OR_DEPARTMENT_OTHER): Admitting: Family Medicine

## 2024-06-23 ENCOUNTER — Telehealth: Payer: Self-pay | Admitting: Acute Care

## 2024-06-23 NOTE — Telephone Encounter (Signed)
 Patient called in and left VM to schedule annual LDCT. Called patient back and advised pt he no longer qualifies for LCS because he has quit smoking more than 15 years ago. I advised pt if he still wants to have LDCT for surveillance it would be an out of pocket cost and could discuss this with his PCP. Pt verbalized understanding and denied any further questions or concerns at this time.

## 2024-06-30 ENCOUNTER — Other Ambulatory Visit: Payer: Self-pay | Admitting: Urology

## 2024-07-03 ENCOUNTER — Other Ambulatory Visit: Payer: Self-pay | Admitting: Family Medicine

## 2024-07-03 DIAGNOSIS — I10 Essential (primary) hypertension: Secondary | ICD-10-CM

## 2024-07-07 ENCOUNTER — Ambulatory Visit (HOSPITAL_BASED_OUTPATIENT_CLINIC_OR_DEPARTMENT_OTHER): Admitting: Family Medicine

## 2024-07-07 ENCOUNTER — Encounter (HOSPITAL_BASED_OUTPATIENT_CLINIC_OR_DEPARTMENT_OTHER): Payer: Self-pay | Admitting: Family Medicine

## 2024-07-07 VITALS — BP 141/70 | HR 56 | Temp 98.6°F | Resp 18 | Ht 68.25 in | Wt 193.0 lb

## 2024-07-07 DIAGNOSIS — E1143 Type 2 diabetes mellitus with diabetic autonomic (poly)neuropathy: Secondary | ICD-10-CM

## 2024-07-07 DIAGNOSIS — Z01818 Encounter for other preprocedural examination: Secondary | ICD-10-CM | POA: Insufficient documentation

## 2024-07-07 LAB — POCT GLYCOSYLATED HEMOGLOBIN (HGB A1C): Hemoglobin A1C: 7.1 % — AB (ref 4.0–5.6)

## 2024-07-07 NOTE — Assessment & Plan Note (Signed)
 Hemoglobin A1c has been gradually improving with lifestyle modifications.  He does continue with glipizide  as well as metformin .  Ultimately, he is wanting to reduce medications being taken, but is also aware of need for appropriately controlled A1c. At this time, can continue with current medication regimen as well as continue focus on lifestyle modifications. Point-of-care A1c completed in office today, continues to improve but still above goal at 7.8%.

## 2024-07-07 NOTE — Assessment & Plan Note (Signed)
 Based on history and exam will order the following preoperative tests: A1c today - . It does look like patient is planning to have preop testing later this week as well. Patient is medically cleared to proceed with planned surgery.

## 2024-07-07 NOTE — Progress Notes (Unsigned)
" ° ° °  Procedures performed today:    None.  Independent interpretation of notes and tests performed by another provider:   None.  Brief History, Exam, Impression, and Recommendations:    Jeff Lopez is a 74 y.o. presenting for preoperative evaluation/clearance. The planned procedure is transurethral prostate ablation with the treatment goal of reduced symptoms. Cardiac risk for planned procedure is Intermediate (1 to 5%) - intraperitoneal or intrathoracic surgery, carotid endarterectomy, head and neck surgery, orthopedic surgery, prostate surgery  Signs or symptoms of cardiovascular disease? No New or unstable cardiopulmonary signs or symptoms? No Urinary symptoms or invasive urologic procedure? No  BP (!) 141/70 (BP Location: Left Arm, Patient Position: Sitting, Cuff Size: Normal)   Pulse (!) 56   Temp 98.6 F (37 C) (Oral)   Resp 18   Ht 5' 8.25 (1.734 m)   Wt 193 lb (87.5 kg)   SpO2 98%   BMI 29.13 kg/m   Exam: 74 year old male in no acute distress Cardiovascular exam with regular rate and rhythm Lungs clear to auscultation bilaterally  Preoperative clearance Based on history and exam will order the following preoperative tests: A1c today - . It does look like patient is planning to have preop testing later this week as well. Patient is medically cleared to proceed with planned surgery.  Type II diabetes mellitus with peripheral autonomic neuropathy (HCC) Hemoglobin A1c has been gradually improving with lifestyle modifications.  He does continue with glipizide  as well as metformin .  Ultimately, he is wanting to reduce medications being taken, but is also aware of need for appropriately controlled A1c. At this time, can continue with current medication regimen as well as continue focus on lifestyle modifications. Point-of-care A1c completed in office today, continues to improve but still above goal at 7.8%.   ___________________________________________ Anjelika Ausburn de  [place], MD, ABFM, CAQSM Primary Care and Sports Medicine [facility]  "

## 2024-07-08 ENCOUNTER — Ambulatory Visit (HOSPITAL_BASED_OUTPATIENT_CLINIC_OR_DEPARTMENT_OTHER): Payer: Self-pay | Admitting: Family Medicine

## 2024-07-09 NOTE — Progress Notes (Addendum)
 Date of COVID positive in last 90 days:  PCP - Raymond de [place], MD Cardiologist - Ozell Fell, MD LOV 10/04/23  Medical clearance in media tab dated 07/08/24  Chest x-ray - N/A EKG - 10/04/23 Epic Stress Test - 09/30/21 Epic ECHO - 10/09/22 Epic Cardiac Cath - N/A Pacemaker/ICD device last checked:N/A Spinal Cord Stimulator:N/A  Bowel Prep - N/A  Sleep Study - N/A CPAP -   Fasting Blood Sugar -  Checks Blood Sugar _____ times a day  Last dose of GLP1 agonist-  N/A GLP1 instructions:  Do not take after     Last dose of SGLT-2 inhibitors-  N/A SGLT-2 instructions:  Do not take after     Blood Thinner Instructions: N/A Last dose:   Time: Aspirin  Instructions: ASA 81 Last Dose:  Activity level:  Can go up a flight of stairs and perform activities of daily living without stopping and without symptoms of chest pain or shortness of breath.  Able to exercise without symptoms  Unable to go up a flight of stairs without symptoms of     Anesthesia review: HTN, CAD, PAD, COPD, OSA, DM2  Patient denies shortness of breath, fever, cough and chest pain at PAT appointment  Patient verbalized understanding of instructions that were given to them at the PAT appointment. Patient was also instructed that they will need to review over the PAT instructions again at home before surgery.

## 2024-07-10 ENCOUNTER — Encounter (HOSPITAL_COMMUNITY)
Admission: RE | Admit: 2024-07-10 | Discharge: 2024-07-10 | Disposition: A | Discharge status: Home / Self Care | Source: Ambulatory Visit | Attending: Anesthesiology | Admitting: Anesthesiology

## 2024-07-10 DIAGNOSIS — E1143 Type 2 diabetes mellitus with diabetic autonomic (poly)neuropathy: Secondary | ICD-10-CM

## 2024-07-10 NOTE — Progress Notes (Signed)
 Called patient regarding PST appointment at 1100. He stated that he already called his surgeon's office and his surgery is cancelled. PST scheduler aware.

## 2024-07-11 ENCOUNTER — Telehealth (HOSPITAL_BASED_OUTPATIENT_CLINIC_OR_DEPARTMENT_OTHER): Payer: Self-pay | Admitting: Family Medicine

## 2024-07-11 NOTE — Telephone Encounter (Signed)
 Copied from CRM [id number]. Topic: General - Other >> Jul 11, 2024 10:19 AM Alfonso HERO wrote: Reason for CRM: [facility] calling because the patients surgery clearance form hasn't been rcvd. I see it scanned in the media file can it please be refaxed to [phone]. The patients surgery is Tuesday and they need the form ASAP.

## 2024-07-11 NOTE — Telephone Encounter (Signed)
 Form has been faxed again to [facility] at [phone].

## 2024-07-15 ENCOUNTER — Ambulatory Visit (INDEPENDENT_AMBULATORY_CARE_PROVIDER_SITE_OTHER): Admitting: Family Medicine

## 2024-07-15 ENCOUNTER — Ambulatory Visit (HOSPITAL_COMMUNITY): Admit: 2024-07-15 | Admitting: Urology

## 2024-07-15 ENCOUNTER — Encounter (HOSPITAL_BASED_OUTPATIENT_CLINIC_OR_DEPARTMENT_OTHER): Payer: Self-pay | Admitting: Family Medicine

## 2024-07-15 VITALS — BP 150/89 | HR 66 | Temp 98.9°F | Resp 20 | Ht 68.25 in | Wt 193.0 lb

## 2024-07-15 DIAGNOSIS — J069 Acute upper respiratory infection, unspecified: Secondary | ICD-10-CM | POA: Diagnosis not present

## 2024-07-15 DIAGNOSIS — J019 Acute sinusitis, unspecified: Secondary | ICD-10-CM | POA: Diagnosis not present

## 2024-07-15 LAB — POC COVID19/FLU A&B COMBO
Covid Antigen, POC: NEGATIVE
Influenza A Antigen, POC: NEGATIVE
Influenza B Antigen, POC: NEGATIVE

## 2024-07-15 SURGERY — ABLATION, PROSTATE, TRANSURETHRAL, USING WATERJET
Anesthesia: General

## 2024-07-15 MED ORDER — AMOXICILLIN-POT CLAVULANATE 875-125 MG PO TABS: 1.0000 | ORAL_TABLET | Freq: Two times a day (BID) | ORAL | 0 refills | Status: AC

## 2024-07-15 NOTE — Progress Notes (Signed)
 "     Acute Care Office Visit  Subjective:   Jeff Lopez March 24, 1951 07/15/2024  Chief Complaint  Patient presents with   URI   Sore Throat    Started 07/08/24   Nasal Congestion   Chills    HPI: Patient was seen in PCP office on 07/07/24 for preoperative clearance for upcoming procedure.  He states symptoms began with sneezing, sore throat, congestion and chills on 07/08/2024. He has been using Tussin and tylenol  for the past week. Reports cough syrup helps with coughing at night mildly. Reports ongoing congestion to sinuses and headache. He has mild SHOB due to congestion and ongoing fatigue. Denies hx of pneumonia or bronchitis.    The following portions of the patient's history were reviewed and updated as appropriate: past medical history, past surgical history, family history, social history, allergies, medications, and problem list.   Patient Active Problem List   Diagnosis Date Noted   Preoperative clearance 07/07/2024   Hemangioma of skin and subcutaneous tissue 02/11/2024   Hematuria 02/11/2024   B12 deficiency 12/07/2023   Moderately increased albuminuria 07/19/2023   Basal cell carcinoma (BCC) of left forearm 06/08/2023   BCC (basal cell carcinoma), back 06/08/2023   Sinusitis 06/01/2023   Sun-damaged skin 05/29/2023   Mild nasal congestion 05/29/2023   Chest pressure 08/22/2022   Dizziness 08/22/2022   Romberg's test positive 08/22/2022   PAD (peripheral artery disease) 07/03/2022   COVID-19 04/28/2022   Claudication 03/31/2022   Insomnia due to medical condition 01/24/2022   Pruritus 08/24/2020   OSA (obstructive sleep apnea) 11/25/2018   Non-restorative sleep 11/25/2018   Malignant neoplasm of ascending colon (HCC) 05/16/2018   Low testosterone  03/02/2018   Gynecomastia 02/28/2018   Left lower quadrant pain 12/26/2017   Arthralgia 10/03/2017   Numbness 10/03/2017   Chronic pansinusitis 01/09/2017   Deviated septum 01/09/2017   Chronic left  sacroiliac pain 10/19/2016   Chronic left-sided low back pain with left-sided sciatica 07/31/2016   Spinal stenosis, lumbar region, with neurogenic claudication 04/21/2015    Class: Chronic   Spondylolisthesis of lumbar region 04/21/2015    Class: Chronic   Hypothyroidism following radioiodine therapy 03/10/2015   Screening for prostate cancer 03/10/2015   Hemochromatosis 03/27/2014   Choledocholithiasis 07/14/2013   Nonspecific (abnormal) findings on radiological and other examination of biliary tract 07/13/2013   Calculus of gallbladder with acute cholecystitis 07/13/2013   Calculus of bile duct 07/13/2013   Abdominal pain 07/12/2013   Neck pain on left side 05/30/2013   Quit smoking 11/28/2012   Postoperative CSF leak 08/10/2012    Class: Acute   Degenerative disc disease, lumbar 08/06/2012    Class: Chronic   HNP (herniated nucleus pulposus), lumbar 08/06/2012    Class: Acute   Type II diabetes mellitus with peripheral autonomic neuropathy (HCC) 06/10/2012   COPD GOLD 0 02/09/2012   Chest pain at rest 02/06/2012   Mixed allergic and non-allergic rhinitis 02/06/2012   Wellness examination 08/12/2011   Radiculopathy of leg 11/05/2010   Cough 08/23/2010   RENAL CYST 05/27/2010   LUMBAR DISC DISORDER 05/27/2010   Hypertension 04/19/2010   CAD, NATIVE VESSEL 04/19/2010   Hyperlipidemia associated with type 2 diabetes mellitus (HCC) 04/09/2007   GERD 04/09/2007   Spondylosis 04/09/2007   Past Medical History:  Diagnosis Date   Allergy    per pt   CAD, NATIVE VESSEL 04/19/2010   nonobstructive by cath 2007:  oLAD 20-30%, mLAD 50%, pCFX 20-30%, oAVCFX 20-30%, L renal art  50%;  normal LVF   Cataract    bil cataracts removed   COPD (chronic obstructive pulmonary disease) (HCC)    Diabetes mellitus without complication (HCC)    Enlarged prostate    GERD 04/09/2007   Headache(784.0)    HYPERLIPIDEMIA 04/09/2007   Patient denies.   HYPERTENSION, BENIGN 04/19/2010    Hypothyroidism    Hypothyroidism    previous hyperthyroidism, s/p I-131   LUMBAR DISC DISORDER 05/27/2010   Nerve damage    Left leg   OSTEOARTHRITIS, LUMBAR SPINE 04/09/2007   RENAL CYST 05/27/2010   Spinal headache    After having back surgery in 2014   Past Surgical History:  Procedure Laterality Date   ABDOMINAL AORTOGRAM W/LOWER EXTREMITY N/A 07/03/2022   Procedure: ABDOMINAL AORTOGRAM W/LOWER EXTREMITY;  Surgeon: Sheree Penne Bruckner, MD;  Location: [facility] INVASIVE CV LAB;  Service: Cardiovascular;  Laterality: N/A;   ABDOMINAL AORTOGRAM W/LOWER EXTREMITY N/A 12/18/2022   Procedure: ABDOMINAL AORTOGRAM W/LOWER EXTREMITY;  Surgeon: Sheree Penne Bruckner, MD;  Location: [facility] INVASIVE CV LAB;  Service: Cardiovascular;  Laterality: N/A;   BACK SURGERY  2012,2014   CARDIAC CATHETERIZATION  2007   Dr Kassie   CHOLECYSTECTOMY     CHOLECYSTECTOMY N/A 07/13/2013   Procedure: LAPAROSCOPIC CHOLECYSTECTOMY WITH INTRAOPERATIVE CHOLANGIOGRAM;  Surgeon: Alm VEAR Angle, MD;  Location: [facility] ORS;  Service: General;  Laterality: N/A;   COLONOSCOPY  2018   COLONOSCOPY W/ POLYPECTOMY     ERCP N/A 07/14/2013   Procedure: ENDOSCOPIC RETROGRADE CHOLANGIOPANCREATOGRAPHY (ERCP);  Surgeon: Toribio SHAUNNA Cedar, MD;  Location: [facility];  Service: Endoscopy;  Laterality: N/A;   EYE SURGERY Bilateral    Cataract removal   FOOT FRACTURE SURGERY     FRACTURE SURGERY     LUMBAR DISC SURGERY  08/06/2012   L3 & L4   LUMBAR DISC SURGERY  11/11/2015   L1 L2    DR NITKA   LUMBAR FUSION N/A 04/20/2015   Procedure: T12 to L1 fusion (Extension of Previous Fusion L2-S1 to T12-S1), Right Transforaminal lumbar interbody fusion, Posterior Fusion T12 to L1, with Pedicle screws, allograft, local bone graft, and  Vivigen;  Surgeon: Lynwood FORBES Better, MD;  Location: [facility] OR;  Service: Orthopedics;  Laterality: N/A;   LUMBAR LAMINECTOMY N/A 11/10/2013   Procedure: Left L1-2 far lateral approach to excise herniated nucleus pulposus;   Surgeon: Lynwood FORBES Better, MD;  Location: [facility] OR;  Service: Orthopedics;  Laterality: N/A;   LUMBAR LAMINECTOMY/DECOMPRESSION MICRODISCECTOMY Left 08/10/2012   Procedure: Dura Repair Left Side L2-L3;  Surgeon: Lynwood FORBES Better, MD;  Location: [facility] OR;  Service: Orthopedics;  Laterality: Left;  Wilson Frame, Sliding table, dura repair kit, microscope   NECK SURGERY     X 2   PERIPHERAL VASCULAR INTERVENTION Left 07/03/2022   Procedure: PERIPHERAL VASCULAR INTERVENTION;  Surgeon: Sheree Penne Bruckner, MD;  Location: [facility] INVASIVE CV LAB;  Service: Cardiovascular;  Laterality: Left;  fem-pop   PERIPHERAL VASCULAR INTERVENTION  12/18/2022   Procedure: PERIPHERAL VASCULAR INTERVENTION;  Surgeon: Sheree Penne Bruckner, MD;  Location: [facility] INVASIVE CV LAB;  Service: Cardiovascular;;   SINOSCOPY     SPINE SURGERY  2006   C-spine surgery x 2   UPPER GASTROINTESTINAL ENDOSCOPY     Family History  Problem Relation Age of Onset   Breast cancer Mother    Uterine cancer Mother    Heart disease Mother    Heart attack Mother 33   Prostate cancer Father    Hypertension Brother  Non-Hodgkin's lymphoma Brother    Stomach cancer Paternal Grandmother    Thyroid  disease Neg Hx    Colon cancer Neg Hx    Esophageal cancer Neg Hx    Rectal cancer Neg Hx    Pancreatic cancer Neg Hx    Colon polyps Neg Hx    Outpatient Medications Prior to Visit  Medication Sig Dispense Refill   amLODipine -olmesartan  (AZOR ) 5-40 MG tablet TAKE 1 TABLET BY MOUTH ONCE DAILY 90 tablet 0   aspirin  EC 81 MG tablet Take 81 mg by mouth daily. Swallow whole.     cetirizine (ZYRTEC) 10 MG chewable tablet Chew 10 mg by mouth daily.     clobetasol (TEMOVATE) 0.05 % external solution apply 1 application TO THE SCALP 2 TIMES DAILY x14 DAYS     dextromethorphan 15 MG/5ML syrup Take 10 mLs by mouth 4 (four) times daily as needed for cough.     eszopiclone  (LUNESTA ) 2 MG TABS tablet TAKE 1 TABLET BY MOUTH NIGHTLY AT BEDTIME AS NEEDED FOR SLEEP  30 tablet 1   finasteride (PROSCAR) 5 MG tablet Take 5 mg by mouth daily.     glipiZIDE  (GLUCOTROL  XL) 10 MG 24 hr tablet TAKE 1 TABLET BY MOUTH ONCE DAILY WITH BREAKFAST 90 tablet 1   hydrocortisone 2.5 % cream Apply 1 Application topically 2 (two) times daily.     levothyroxine  (SYNTHROID ) 137 MCG tablet TAKE 1 TABLET BY MOUTH EVERY DAY BEFORE BREAKFAST 90 tablet 1   metFORMIN  (GLUCOPHAGE -XR) 500 MG 24 hr tablet TAKE 2 TABLETS BY MOUTH EVERY MORNING WITH BREAKFAST 180 tablet 1   metoprolol  succinate (TOPROL -XL) 50 MG 24 hr tablet TAKE 1 TABLET BY MOUTH EVERY DAY WITH OR immediately following A meal 90 tablet 2   pantoprazole  (PROTONIX ) 40 MG tablet Take 1 tablet (40 mg total) by mouth daily. TAKE 1 TABLET BY MOUTH 2 TIMES DAILY BEFORE A MEAL 90 tablet 3   rosuvastatin  (CRESTOR ) 20 MG tablet TAKE 1 TABLET BY MOUTH EVERY DAY (Patient taking differently: Take 20 mg by mouth every other day.) 90 tablet 1   tiZANidine  (ZANAFLEX ) 4 MG tablet Take 1 tablet (4 mg total) by mouth at bedtime as needed for muscle spasms. 30 tablet 0   No facility-administered medications prior to visit.   Allergies[1]   ROS: A complete ROS was performed with pertinent positives/negatives noted in the HPI. The remainder of the ROS are negative.    Objective:   Today's Vitals   07/15/24 1546  BP: (!) 150/89  Pulse: 66  Resp: 20  Temp: 98.9 F (37.2 C)  TempSrc: Oral  SpO2: 97%  Weight: 193 lb (87.5 kg)  Height: 5' 8.25 (1.734 m)  PainSc: 0-No pain    GENERAL: Well-appearing, in NAD. Well nourished.  SKIN: Pink, warm and dry.  Head: Normocephalic. NECK: Trachea midline. Full ROM w/o pain or tenderness. No lymphadenopathy.  EARS: Tympanic membranes are intact, translucent without bulging and without drainage. Appropriate landmarks visualized.  EYES: Conjunctiva clear without exudates. EOMI, PERRL, no drainage present.  NOSE: Septum midline w/o deformity. Nares patent, mucosa pink and moderately inflamed  w/ cloudy drainage. No sinus tenderness.  THROAT: Uvula midline. Oropharynx clear. Post-pharyngeal irritation present.  Mucous membranes pink and moist.  RESPIRATORY: Chest wall symmetrical. Respirations even and non-labored. Breath sounds clear to auscultation bilaterally.  CARDIAC: S1, S2 present, regular rate and rhythm without murmur or gallops. Peripheral pulses 2+ bilaterally.  MSK: Muscle tone and strength appropriate for age.  NEUROLOGIC: No  motor or sensory deficits. Steady, even gait. C2-C12 intact.  PSYCH/MENTAL STATUS: Alert, oriented x 3. Cooperative, appropriate mood and affect.    Results for orders placed or performed in visit on 07/15/24  POC Covid19/Flu A&B Antigen  Result Value Ref Range   Influenza A Antigen, POC Negative Negative   Influenza B Antigen, POC Negative Negative   Covid Antigen, POC Negative Negative      Assessment & Plan:  1. Acute sinusitis, recurrence not specified, unspecified location Negative for COVID/Flu in in office today. Likely acute sinusitis given duration and ongoing symptoms. Will treat with Augmentin  BID x 7 days given previous tolerance. If no improvement or worsening in 48 hours, reach out to PCP.  - POC Covid19/Flu A&B Antigen - amoxicillin -clavulanate (AUGMENTIN ) 875-125 MG tablet; Take 1 tablet by mouth 2 (two) times daily for 7 days.  Dispense: 14 tablet; Refill: 0   Meds ordered this encounter  Medications   amoxicillin -clavulanate (AUGMENTIN ) 875-125 MG tablet    Sig: Take 1 tablet by mouth 2 (two) times daily for 7 days.    Dispense:  14 tablet    Refill:  0    Supervising Provider:   DE CUBA, RAYMOND J [[id number]]   Lab Orders         POC Covid19/Flu A&B Antigen    No images are attached to the encounter or orders placed in the encounter.  Return if symptoms worsen or fail to improve.    Patient to reach out to office if new, worrisome, or unresolved symptoms arise or if no improvement in patient's condition. Patient  verbalized understanding and is agreeable to treatment plan. All questions answered to patient's satisfaction.    Thersia Schuyler Stark, FNP      [1]  Allergies Allergen Reactions   Ceftin  Other (See Comments)    Patient stated it caused sores in mouth Thrush   Cefuroxime  Axetil Other (See Comments)    sores in mouth Patient stated it caused sores in mouth Thrush    Cephalexin Other (See Comments)    Other   "

## 2024-07-16 ENCOUNTER — Ambulatory Visit: Admitting: Pulmonary Disease

## 2024-07-18 ENCOUNTER — Other Ambulatory Visit: Payer: Self-pay | Admitting: Family Medicine

## 2024-10-08 ENCOUNTER — Ambulatory Visit (HOSPITAL_BASED_OUTPATIENT_CLINIC_OR_DEPARTMENT_OTHER): Admitting: Family Medicine

## 2024-10-21 ENCOUNTER — Encounter (HOSPITAL_BASED_OUTPATIENT_CLINIC_OR_DEPARTMENT_OTHER)
